# Patient Record
Sex: Female | Born: 1950 | Race: Black or African American | Hispanic: No | Marital: Married | Smoking: Never smoker
Health system: Southern US, Community
[De-identification: ages and names within clinical notes are randomized; demographics above are authoritative.]

## PROBLEM LIST (undated history)

## (undated) DIAGNOSIS — R202 Paresthesia of skin: Secondary | ICD-10-CM

## (undated) DIAGNOSIS — K219 Gastro-esophageal reflux disease without esophagitis: Secondary | ICD-10-CM

## (undated) DIAGNOSIS — R200 Anesthesia of skin: Secondary | ICD-10-CM

## (undated) DIAGNOSIS — M069 Rheumatoid arthritis, unspecified: Secondary | ICD-10-CM

## (undated) DIAGNOSIS — C8590 Non-Hodgkin lymphoma, unspecified, unspecified site: Secondary | ICD-10-CM

## (undated) DIAGNOSIS — D649 Anemia, unspecified: Secondary | ICD-10-CM

## (undated) DIAGNOSIS — J189 Pneumonia, unspecified organism: Secondary | ICD-10-CM

## (undated) DIAGNOSIS — Z8719 Personal history of other diseases of the digestive system: Secondary | ICD-10-CM

## (undated) DIAGNOSIS — K909 Intestinal malabsorption, unspecified: Secondary | ICD-10-CM

## (undated) DIAGNOSIS — I10 Essential (primary) hypertension: Secondary | ICD-10-CM

## (undated) DIAGNOSIS — M1990 Unspecified osteoarthritis, unspecified site: Secondary | ICD-10-CM

## (undated) DIAGNOSIS — C801 Malignant (primary) neoplasm, unspecified: Secondary | ICD-10-CM

## (undated) HISTORY — PX: PORTACATH PLACEMENT: SHX2246

## (undated) HISTORY — PX: JOINT REPLACEMENT: SHX530

## (undated) HISTORY — PX: EYE SURGERY: SHX253

## (undated) HISTORY — PX: OTHER SURGICAL HISTORY: SHX169

## (undated) HISTORY — PX: ROTATOR CUFF REPAIR: SHX139

## (undated) HISTORY — DX: Intestinal malabsorption, unspecified: K90.9

## (undated) HISTORY — PX: ABDOMINAL HYSTERECTOMY: SHX81

---

## 1998-04-15 ENCOUNTER — Encounter: Payer: Self-pay | Admitting: Obstetrics and Gynecology

## 1998-04-15 ENCOUNTER — Ambulatory Visit (HOSPITAL_COMMUNITY): Admission: RE | Admit: 1998-04-15 | Discharge: 1998-04-15 | Payer: Self-pay | Admitting: Obstetrics and Gynecology

## 1998-07-20 ENCOUNTER — Emergency Department (HOSPITAL_COMMUNITY): Admission: EM | Admit: 1998-07-20 | Discharge: 1998-07-20 | Payer: Self-pay | Admitting: Emergency Medicine

## 1998-09-12 ENCOUNTER — Inpatient Hospital Stay (HOSPITAL_COMMUNITY): Admission: RE | Admit: 1998-09-12 | Discharge: 1998-09-15 | Payer: Self-pay | Admitting: Obstetrics and Gynecology

## 1999-04-17 ENCOUNTER — Ambulatory Visit (HOSPITAL_COMMUNITY): Admission: RE | Admit: 1999-04-17 | Discharge: 1999-04-17 | Payer: Self-pay | Admitting: Obstetrics and Gynecology

## 1999-04-17 ENCOUNTER — Encounter: Payer: Self-pay | Admitting: Obstetrics and Gynecology

## 1999-12-11 ENCOUNTER — Other Ambulatory Visit: Admission: RE | Admit: 1999-12-11 | Discharge: 1999-12-11 | Payer: Self-pay | Admitting: Obstetrics and Gynecology

## 2000-04-19 ENCOUNTER — Encounter: Payer: Self-pay | Admitting: Obstetrics and Gynecology

## 2000-04-19 ENCOUNTER — Ambulatory Visit (HOSPITAL_COMMUNITY): Admission: RE | Admit: 2000-04-19 | Discharge: 2000-04-19 | Payer: Self-pay | Admitting: Obstetrics and Gynecology

## 2000-12-16 ENCOUNTER — Other Ambulatory Visit: Admission: RE | Admit: 2000-12-16 | Discharge: 2000-12-16 | Payer: Self-pay | Admitting: Obstetrics and Gynecology

## 2001-01-30 ENCOUNTER — Emergency Department (HOSPITAL_COMMUNITY): Admission: EM | Admit: 2001-01-30 | Discharge: 2001-01-30 | Payer: Self-pay | Admitting: Emergency Medicine

## 2001-03-16 ENCOUNTER — Encounter (INDEPENDENT_AMBULATORY_CARE_PROVIDER_SITE_OTHER): Payer: Self-pay | Admitting: *Deleted

## 2001-03-16 ENCOUNTER — Ambulatory Visit (HOSPITAL_COMMUNITY): Admission: RE | Admit: 2001-03-16 | Discharge: 2001-03-16 | Payer: Self-pay | Admitting: *Deleted

## 2001-04-21 ENCOUNTER — Encounter: Payer: Self-pay | Admitting: Obstetrics and Gynecology

## 2001-04-21 ENCOUNTER — Ambulatory Visit (HOSPITAL_COMMUNITY): Admission: RE | Admit: 2001-04-21 | Discharge: 2001-04-21 | Payer: Self-pay | Admitting: Obstetrics and Gynecology

## 2001-05-02 ENCOUNTER — Encounter: Admission: RE | Admit: 2001-05-02 | Discharge: 2001-05-02 | Payer: Self-pay | Admitting: Obstetrics and Gynecology

## 2001-05-02 ENCOUNTER — Encounter: Payer: Self-pay | Admitting: Obstetrics and Gynecology

## 2002-03-15 ENCOUNTER — Encounter: Admission: RE | Admit: 2002-03-15 | Discharge: 2002-03-15 | Payer: Self-pay | Admitting: Orthopedic Surgery

## 2002-03-15 ENCOUNTER — Encounter: Payer: Self-pay | Admitting: Orthopedic Surgery

## 2002-04-25 ENCOUNTER — Ambulatory Visit (HOSPITAL_COMMUNITY): Admission: RE | Admit: 2002-04-25 | Discharge: 2002-04-25 | Payer: Self-pay | Admitting: *Deleted

## 2002-04-25 ENCOUNTER — Encounter (INDEPENDENT_AMBULATORY_CARE_PROVIDER_SITE_OTHER): Payer: Self-pay | Admitting: Specialist

## 2002-05-03 ENCOUNTER — Encounter: Admission: RE | Admit: 2002-05-03 | Discharge: 2002-05-03 | Payer: Self-pay | Admitting: Obstetrics and Gynecology

## 2002-05-03 ENCOUNTER — Encounter: Payer: Self-pay | Admitting: Obstetrics and Gynecology

## 2002-11-09 ENCOUNTER — Encounter: Admission: RE | Admit: 2002-11-09 | Discharge: 2002-11-09 | Payer: Self-pay | Admitting: *Deleted

## 2002-11-09 ENCOUNTER — Encounter: Payer: Self-pay | Admitting: *Deleted

## 2003-04-14 ENCOUNTER — Emergency Department (HOSPITAL_COMMUNITY): Admission: EM | Admit: 2003-04-14 | Discharge: 2003-04-14 | Payer: Self-pay

## 2003-05-08 ENCOUNTER — Encounter: Admission: RE | Admit: 2003-05-08 | Discharge: 2003-05-08 | Payer: Self-pay | Admitting: Obstetrics and Gynecology

## 2003-07-09 ENCOUNTER — Encounter: Admission: RE | Admit: 2003-07-09 | Discharge: 2003-07-16 | Payer: Self-pay | Admitting: Internal Medicine

## 2003-07-17 ENCOUNTER — Ambulatory Visit (HOSPITAL_COMMUNITY): Admission: RE | Admit: 2003-07-17 | Discharge: 2003-07-17 | Payer: Self-pay | Admitting: Internal Medicine

## 2003-11-14 ENCOUNTER — Observation Stay (HOSPITAL_COMMUNITY): Admission: RE | Admit: 2003-11-14 | Discharge: 2003-11-15 | Payer: Self-pay | Admitting: Urology

## 2004-01-02 ENCOUNTER — Ambulatory Visit (HOSPITAL_COMMUNITY): Admission: RE | Admit: 2004-01-02 | Discharge: 2004-01-02 | Payer: Self-pay

## 2004-02-13 ENCOUNTER — Encounter (INDEPENDENT_AMBULATORY_CARE_PROVIDER_SITE_OTHER): Payer: Self-pay | Admitting: *Deleted

## 2004-02-13 ENCOUNTER — Encounter (INDEPENDENT_AMBULATORY_CARE_PROVIDER_SITE_OTHER): Payer: Self-pay | Admitting: General Surgery

## 2004-02-13 ENCOUNTER — Ambulatory Visit (HOSPITAL_COMMUNITY): Admission: RE | Admit: 2004-02-13 | Discharge: 2004-02-13 | Payer: Self-pay | Admitting: General Surgery

## 2004-02-29 ENCOUNTER — Ambulatory Visit (HOSPITAL_COMMUNITY): Admission: RE | Admit: 2004-02-29 | Discharge: 2004-02-29 | Payer: Self-pay | Admitting: Hematology & Oncology

## 2004-03-03 ENCOUNTER — Ambulatory Visit (HOSPITAL_COMMUNITY): Admission: RE | Admit: 2004-03-03 | Discharge: 2004-03-03 | Payer: Self-pay | Admitting: Hematology & Oncology

## 2004-04-28 ENCOUNTER — Encounter: Admission: RE | Admit: 2004-04-28 | Discharge: 2004-04-28 | Payer: Self-pay | Admitting: Hematology & Oncology

## 2004-05-02 ENCOUNTER — Ambulatory Visit: Payer: Self-pay | Admitting: Hematology & Oncology

## 2004-05-05 ENCOUNTER — Encounter (HOSPITAL_COMMUNITY): Admission: RE | Admit: 2004-05-05 | Discharge: 2004-07-05 | Payer: Self-pay | Admitting: Hematology & Oncology

## 2004-05-06 ENCOUNTER — Emergency Department (HOSPITAL_COMMUNITY): Admission: EM | Admit: 2004-05-06 | Discharge: 2004-05-06 | Payer: Self-pay | Admitting: Emergency Medicine

## 2004-05-09 ENCOUNTER — Inpatient Hospital Stay (HOSPITAL_COMMUNITY): Admission: EM | Admit: 2004-05-09 | Discharge: 2004-05-15 | Payer: Self-pay | Admitting: Hematology & Oncology

## 2004-05-09 ENCOUNTER — Ambulatory Visit: Payer: Self-pay | Admitting: Hematology & Oncology

## 2004-05-26 ENCOUNTER — Ambulatory Visit (HOSPITAL_COMMUNITY): Admission: RE | Admit: 2004-05-26 | Discharge: 2004-05-26 | Payer: Self-pay | Admitting: Hematology & Oncology

## 2004-06-24 ENCOUNTER — Ambulatory Visit: Payer: Self-pay | Admitting: Hematology & Oncology

## 2004-08-08 ENCOUNTER — Ambulatory Visit (HOSPITAL_COMMUNITY): Admission: RE | Admit: 2004-08-08 | Discharge: 2004-08-08 | Payer: Self-pay | Admitting: Hematology & Oncology

## 2004-08-21 ENCOUNTER — Ambulatory Visit: Payer: Self-pay | Admitting: Hematology & Oncology

## 2004-10-15 ENCOUNTER — Ambulatory Visit: Payer: Self-pay | Admitting: Hematology & Oncology

## 2004-10-22 ENCOUNTER — Encounter (INDEPENDENT_AMBULATORY_CARE_PROVIDER_SITE_OTHER): Payer: Self-pay | Admitting: Specialist

## 2004-10-22 ENCOUNTER — Ambulatory Visit (HOSPITAL_COMMUNITY): Admission: RE | Admit: 2004-10-22 | Discharge: 2004-10-22 | Payer: Self-pay | Admitting: *Deleted

## 2004-11-07 ENCOUNTER — Ambulatory Visit (HOSPITAL_COMMUNITY): Admission: RE | Admit: 2004-11-07 | Discharge: 2004-11-07 | Payer: Self-pay | Admitting: Hematology & Oncology

## 2004-12-23 ENCOUNTER — Ambulatory Visit: Payer: Self-pay | Admitting: Hematology & Oncology

## 2005-02-06 ENCOUNTER — Ambulatory Visit (HOSPITAL_COMMUNITY): Admission: RE | Admit: 2005-02-06 | Discharge: 2005-02-06 | Payer: Self-pay | Admitting: Hematology & Oncology

## 2005-02-12 ENCOUNTER — Ambulatory Visit: Payer: Self-pay | Admitting: Hematology & Oncology

## 2005-04-23 ENCOUNTER — Ambulatory Visit: Payer: Self-pay | Admitting: Hematology & Oncology

## 2005-04-30 ENCOUNTER — Encounter: Admission: RE | Admit: 2005-04-30 | Discharge: 2005-04-30 | Payer: Self-pay | Admitting: Obstetrics and Gynecology

## 2005-05-11 ENCOUNTER — Ambulatory Visit (HOSPITAL_COMMUNITY): Admission: RE | Admit: 2005-05-11 | Discharge: 2005-05-11 | Payer: Self-pay | Admitting: Hematology & Oncology

## 2005-05-20 ENCOUNTER — Emergency Department (HOSPITAL_COMMUNITY): Admission: EM | Admit: 2005-05-20 | Discharge: 2005-05-20 | Payer: Self-pay | Admitting: Emergency Medicine

## 2005-07-21 ENCOUNTER — Ambulatory Visit: Payer: Self-pay | Admitting: Hematology & Oncology

## 2005-08-26 ENCOUNTER — Ambulatory Visit (HOSPITAL_COMMUNITY): Admission: RE | Admit: 2005-08-26 | Discharge: 2005-08-26 | Payer: Self-pay | Admitting: Hematology & Oncology

## 2005-10-14 ENCOUNTER — Ambulatory Visit: Payer: Self-pay | Admitting: Hematology & Oncology

## 2005-11-25 ENCOUNTER — Ambulatory Visit: Payer: Self-pay | Admitting: Hematology & Oncology

## 2005-12-23 LAB — CBC WITH DIFFERENTIAL/PLATELET
BASO%: 0.2 % (ref 0.0–2.0)
Basophils Absolute: 0 10*3/uL (ref 0.0–0.1)
EOS%: 0.9 % (ref 0.0–7.0)
Eosinophils Absolute: 0 10*3/uL (ref 0.0–0.5)
HCT: 32.9 % — ABNORMAL LOW (ref 34.8–46.6)
HGB: 11.3 g/dL — ABNORMAL LOW (ref 11.6–15.9)
LYMPH%: 30.1 % (ref 14.0–48.0)
MCH: 37.2 pg — ABNORMAL HIGH (ref 26.0–34.0)
MCHC: 34.2 g/dL (ref 32.0–36.0)
MCV: 108.8 fL — ABNORMAL HIGH (ref 81.0–101.0)
MONO#: 0.3 10*3/uL (ref 0.1–0.9)
MONO%: 6.7 % (ref 0.0–13.0)
NEUT#: 2.6 10*3/uL (ref 1.5–6.5)
NEUT%: 62.1 % (ref 39.6–76.8)
Platelets: 141 10*3/uL — ABNORMAL LOW (ref 145–400)
RBC: 3.03 10*6/uL — ABNORMAL LOW (ref 3.70–5.32)
RDW: 15.1 % — ABNORMAL HIGH (ref 11.3–14.5)
WBC: 4.2 10*3/uL (ref 3.9–10.0)
lymph#: 1.3 10*3/uL (ref 0.9–3.3)

## 2005-12-23 LAB — COMPREHENSIVE METABOLIC PANEL
ALT: 10 U/L (ref 0–40)
AST: 18 U/L (ref 0–37)
Albumin: 4.2 g/dL (ref 3.5–5.2)
Alkaline Phosphatase: 46 U/L (ref 39–117)
BUN: 12 mg/dL (ref 6–23)
CO2: 26 mEq/L (ref 19–32)
Calcium: 9.4 mg/dL (ref 8.4–10.5)
Chloride: 111 mEq/L (ref 96–112)
Creatinine, Ser: 0.81 mg/dL (ref 0.40–1.20)
Glucose, Bld: 103 mg/dL — ABNORMAL HIGH (ref 70–99)
Potassium: 5.2 mEq/L (ref 3.5–5.3)
Sodium: 147 mEq/L — ABNORMAL HIGH (ref 135–145)
Total Bilirubin: 0.4 mg/dL (ref 0.3–1.2)
Total Protein: 6.7 g/dL (ref 6.0–8.3)

## 2005-12-23 LAB — LACTATE DEHYDROGENASE: LDH: 196 U/L (ref 94–250)

## 2005-12-24 ENCOUNTER — Emergency Department (HOSPITAL_COMMUNITY): Admission: EM | Admit: 2005-12-24 | Discharge: 2005-12-25 | Payer: Self-pay | Admitting: Emergency Medicine

## 2006-02-08 ENCOUNTER — Ambulatory Visit: Payer: Self-pay | Admitting: Hematology & Oncology

## 2006-03-24 ENCOUNTER — Ambulatory Visit: Payer: Self-pay | Admitting: Hematology & Oncology

## 2006-05-03 ENCOUNTER — Encounter: Admission: RE | Admit: 2006-05-03 | Discharge: 2006-05-03 | Payer: Self-pay | Admitting: Obstetrics and Gynecology

## 2006-05-05 ENCOUNTER — Ambulatory Visit: Payer: Self-pay | Admitting: Hematology & Oncology

## 2006-05-14 ENCOUNTER — Encounter: Admission: RE | Admit: 2006-05-14 | Discharge: 2006-05-14 | Payer: Self-pay | Admitting: Obstetrics and Gynecology

## 2006-05-31 ENCOUNTER — Ambulatory Visit (HOSPITAL_COMMUNITY): Admission: RE | Admit: 2006-05-31 | Discharge: 2006-05-31 | Payer: Self-pay | Admitting: *Deleted

## 2006-06-07 LAB — COMPREHENSIVE METABOLIC PANEL
ALT: 10 U/L (ref 0–35)
AST: 18 U/L (ref 0–37)
Albumin: 4.1 g/dL (ref 3.5–5.2)
Alkaline Phosphatase: 48 U/L (ref 39–117)
BUN: 19 mg/dL (ref 6–23)
CO2: 23 mEq/L (ref 19–32)
Calcium: 9.2 mg/dL (ref 8.4–10.5)
Chloride: 109 mEq/L (ref 96–112)
Creatinine, Ser: 0.73 mg/dL (ref 0.40–1.20)
Glucose, Bld: 98 mg/dL (ref 70–99)
Potassium: 4.6 mEq/L (ref 3.5–5.3)
Sodium: 142 mEq/L (ref 135–145)
Total Bilirubin: 0.3 mg/dL (ref 0.3–1.2)
Total Protein: 6.3 g/dL (ref 6.0–8.3)

## 2006-06-07 LAB — CBC WITH DIFFERENTIAL/PLATELET
BASO%: 0.1 % (ref 0.0–2.0)
Basophils Absolute: 0 10*3/uL (ref 0.0–0.1)
EOS%: 0.8 % (ref 0.0–7.0)
Eosinophils Absolute: 0 10*3/uL (ref 0.0–0.5)
HCT: 31 % — ABNORMAL LOW (ref 34.8–46.6)
HGB: 10.8 g/dL — ABNORMAL LOW (ref 11.6–15.9)
LYMPH%: 29.8 % (ref 14.0–48.0)
MCH: 37.5 pg — ABNORMAL HIGH (ref 26.0–34.0)
MCHC: 34.6 g/dL (ref 32.0–36.0)
MCV: 108.2 fL — ABNORMAL HIGH (ref 81.0–101.0)
MONO#: 0.3 10*3/uL (ref 0.1–0.9)
MONO%: 6.6 % (ref 0.0–13.0)
NEUT#: 2.5 10*3/uL (ref 1.5–6.5)
NEUT%: 62.7 % (ref 39.6–76.8)
Platelets: 141 10*3/uL — ABNORMAL LOW (ref 145–400)
RBC: 2.87 10*6/uL — ABNORMAL LOW (ref 3.70–5.32)
RDW: 14.4 % (ref 11.3–14.5)
WBC: 4 10*3/uL (ref 3.9–10.0)
lymph#: 1.2 10*3/uL (ref 0.9–3.3)

## 2006-06-07 LAB — LACTATE DEHYDROGENASE: LDH: 190 U/L (ref 94–250)

## 2006-06-09 ENCOUNTER — Ambulatory Visit (HOSPITAL_COMMUNITY): Admission: RE | Admit: 2006-06-09 | Discharge: 2006-06-09 | Payer: Self-pay | Admitting: Hematology & Oncology

## 2006-06-21 ENCOUNTER — Ambulatory Visit: Payer: Self-pay | Admitting: Hematology & Oncology

## 2006-08-02 ENCOUNTER — Emergency Department (HOSPITAL_COMMUNITY): Admission: EM | Admit: 2006-08-02 | Discharge: 2006-08-02 | Payer: Self-pay | Admitting: Emergency Medicine

## 2006-08-30 ENCOUNTER — Ambulatory Visit: Payer: Self-pay | Admitting: Hematology & Oncology

## 2006-10-11 ENCOUNTER — Ambulatory Visit: Payer: Self-pay | Admitting: Hematology & Oncology

## 2006-11-10 ENCOUNTER — Encounter: Admission: RE | Admit: 2006-11-10 | Discharge: 2006-11-10 | Payer: Self-pay | Admitting: Obstetrics and Gynecology

## 2006-11-24 ENCOUNTER — Ambulatory Visit: Payer: Self-pay | Admitting: Hematology & Oncology

## 2006-12-14 LAB — CBC WITH DIFFERENTIAL/PLATELET
BASO%: 0.2 % (ref 0.0–2.0)
Basophils Absolute: 0 10*3/uL (ref 0.0–0.1)
EOS%: 0.5 % (ref 0.0–7.0)
Eosinophils Absolute: 0 10*3/uL (ref 0.0–0.5)
HCT: 28.6 % — ABNORMAL LOW (ref 34.8–46.6)
HGB: 10.2 g/dL — ABNORMAL LOW (ref 11.6–15.9)
LYMPH%: 26.9 % (ref 14.0–48.0)
MCH: 38.5 pg — ABNORMAL HIGH (ref 26.0–34.0)
MCHC: 35.7 g/dL (ref 32.0–36.0)
MCV: 107.9 fL — ABNORMAL HIGH (ref 81.0–101.0)
MONO#: 0.2 10*3/uL (ref 0.1–0.9)
MONO%: 4.8 % (ref 0.0–13.0)
NEUT#: 2.9 10*3/uL (ref 1.5–6.5)
NEUT%: 67.6 % (ref 39.6–76.8)
Platelets: 140 10*3/uL — ABNORMAL LOW (ref 145–400)
RBC: 2.66 10*6/uL — ABNORMAL LOW (ref 3.70–5.32)
RDW: 15 % — ABNORMAL HIGH (ref 11.3–14.5)
WBC: 4.2 10*3/uL (ref 3.9–10.0)
lymph#: 1.1 10*3/uL (ref 0.9–3.3)

## 2006-12-14 LAB — COMPREHENSIVE METABOLIC PANEL
ALT: 14 U/L (ref 0–35)
AST: 17 U/L (ref 0–37)
Albumin: 4.3 g/dL (ref 3.5–5.2)
Alkaline Phosphatase: 44 U/L (ref 39–117)
BUN: 17 mg/dL (ref 6–23)
CO2: 26 mEq/L (ref 19–32)
Calcium: 9.2 mg/dL (ref 8.4–10.5)
Chloride: 108 mEq/L (ref 96–112)
Creatinine, Ser: 0.78 mg/dL (ref 0.40–1.20)
Glucose, Bld: 106 mg/dL — ABNORMAL HIGH (ref 70–99)
Potassium: 4.4 mEq/L (ref 3.5–5.3)
Sodium: 142 mEq/L (ref 135–145)
Total Bilirubin: 0.4 mg/dL (ref 0.3–1.2)
Total Protein: 6.4 g/dL (ref 6.0–8.3)

## 2006-12-14 LAB — LACTATE DEHYDROGENASE: LDH: 224 U/L (ref 94–250)

## 2006-12-16 ENCOUNTER — Ambulatory Visit (HOSPITAL_COMMUNITY): Admission: RE | Admit: 2006-12-16 | Discharge: 2006-12-16 | Payer: Self-pay | Admitting: Hematology & Oncology

## 2007-01-25 ENCOUNTER — Emergency Department (HOSPITAL_COMMUNITY): Admission: EM | Admit: 2007-01-25 | Discharge: 2007-01-25 | Payer: Self-pay | Admitting: Emergency Medicine

## 2007-01-31 ENCOUNTER — Ambulatory Visit: Payer: Self-pay | Admitting: Hematology & Oncology

## 2007-03-08 ENCOUNTER — Ambulatory Visit (HOSPITAL_COMMUNITY): Admission: RE | Admit: 2007-03-08 | Discharge: 2007-03-08 | Payer: Self-pay | Admitting: Hematology & Oncology

## 2007-04-25 ENCOUNTER — Ambulatory Visit: Payer: Self-pay | Admitting: Hematology & Oncology

## 2007-05-05 ENCOUNTER — Encounter: Admission: RE | Admit: 2007-05-05 | Discharge: 2007-05-05 | Payer: Self-pay | Admitting: Obstetrics and Gynecology

## 2007-06-06 ENCOUNTER — Ambulatory Visit: Payer: Self-pay | Admitting: Hematology & Oncology

## 2007-06-06 LAB — CBC WITH DIFFERENTIAL/PLATELET
BASO%: 0.2 % (ref 0.0–2.0)
Basophils Absolute: 0 10*3/uL (ref 0.0–0.1)
EOS%: 0.4 % (ref 0.0–7.0)
Eosinophils Absolute: 0 10*3/uL (ref 0.0–0.5)
HCT: 30.3 % — ABNORMAL LOW (ref 34.8–46.6)
HGB: 10.6 g/dL — ABNORMAL LOW (ref 11.6–15.9)
LYMPH%: 24.7 % (ref 14.0–48.0)
MCH: 37.6 pg — ABNORMAL HIGH (ref 26.0–34.0)
MCHC: 35.1 g/dL (ref 32.0–36.0)
MCV: 107 fL — ABNORMAL HIGH (ref 81.0–101.0)
MONO#: 0.3 10*3/uL (ref 0.1–0.9)
MONO%: 6.1 % (ref 0.0–13.0)
NEUT#: 3.3 10*3/uL (ref 1.5–6.5)
NEUT%: 68.6 % (ref 39.6–76.8)
Platelets: 163 10*3/uL (ref 145–400)
RBC: 2.83 10*6/uL — ABNORMAL LOW (ref 3.70–5.32)
RDW: 14.6 % — ABNORMAL HIGH (ref 11.3–14.5)
WBC: 4.9 10*3/uL (ref 3.9–10.0)
lymph#: 1.2 10*3/uL (ref 0.9–3.3)

## 2007-06-06 LAB — COMPREHENSIVE METABOLIC PANEL
ALT: 16 U/L (ref 0–35)
AST: 19 U/L (ref 0–37)
Albumin: 3.9 g/dL (ref 3.5–5.2)
Alkaline Phosphatase: 44 U/L (ref 39–117)
BUN: 16 mg/dL (ref 6–23)
CO2: 27 mEq/L (ref 19–32)
Calcium: 9.3 mg/dL (ref 8.4–10.5)
Chloride: 104 mEq/L (ref 96–112)
Creatinine, Ser: 0.75 mg/dL (ref 0.40–1.20)
Glucose, Bld: 105 mg/dL — ABNORMAL HIGH (ref 70–99)
Potassium: 3.8 mEq/L (ref 3.5–5.3)
Sodium: 139 mEq/L (ref 135–145)
Total Bilirubin: 0.6 mg/dL (ref 0.3–1.2)
Total Protein: 6.7 g/dL (ref 6.0–8.3)

## 2007-06-06 LAB — LACTATE DEHYDROGENASE: LDH: 199 U/L (ref 94–250)

## 2007-06-07 LAB — ERYTHROPOIETIN: Erythropoietin: 109 m[IU]/mL — ABNORMAL HIGH (ref 2.6–34.0)

## 2007-06-08 ENCOUNTER — Ambulatory Visit (HOSPITAL_COMMUNITY): Admission: RE | Admit: 2007-06-08 | Discharge: 2007-06-08 | Payer: Self-pay | Admitting: Hematology & Oncology

## 2007-07-20 ENCOUNTER — Ambulatory Visit: Payer: Self-pay | Admitting: Hematology & Oncology

## 2007-09-12 ENCOUNTER — Ambulatory Visit: Payer: Self-pay | Admitting: Hematology & Oncology

## 2007-10-24 ENCOUNTER — Ambulatory Visit: Payer: Self-pay | Admitting: Hematology & Oncology

## 2007-11-14 ENCOUNTER — Ambulatory Visit (HOSPITAL_COMMUNITY): Admission: RE | Admit: 2007-11-14 | Discharge: 2007-11-14 | Payer: Self-pay | Admitting: Internal Medicine

## 2007-12-01 ENCOUNTER — Ambulatory Visit (HOSPITAL_COMMUNITY): Admission: RE | Admit: 2007-12-01 | Discharge: 2007-12-01 | Payer: Self-pay | Admitting: Hematology & Oncology

## 2007-12-27 ENCOUNTER — Emergency Department (HOSPITAL_COMMUNITY): Admission: EM | Admit: 2007-12-27 | Discharge: 2007-12-27 | Payer: Self-pay | Admitting: Emergency Medicine

## 2008-01-04 ENCOUNTER — Ambulatory Visit: Payer: Self-pay | Admitting: Vascular Surgery

## 2008-01-17 ENCOUNTER — Ambulatory Visit: Payer: Self-pay | Admitting: Hematology & Oncology

## 2008-02-09 ENCOUNTER — Ambulatory Visit (HOSPITAL_COMMUNITY): Admission: RE | Admit: 2008-02-09 | Discharge: 2008-02-09 | Payer: Self-pay | Admitting: Vascular Surgery

## 2008-02-09 ENCOUNTER — Ambulatory Visit: Payer: Self-pay | Admitting: Vascular Surgery

## 2008-02-29 ENCOUNTER — Ambulatory Visit: Payer: Self-pay | Admitting: Vascular Surgery

## 2008-02-29 ENCOUNTER — Ambulatory Visit: Payer: Self-pay | Admitting: Hematology & Oncology

## 2008-03-01 LAB — CBC WITH DIFFERENTIAL (CANCER CENTER ONLY)
BASO#: 0 10*3/uL (ref 0.0–0.2)
BASO%: 0.5 % (ref 0.0–2.0)
EOS%: 1.2 % (ref 0.0–7.0)
Eosinophils Absolute: 0.1 10*3/uL (ref 0.0–0.5)
HCT: 33.9 % — ABNORMAL LOW (ref 34.8–46.6)
HGB: 11.8 g/dL (ref 11.6–15.9)
LYMPH#: 1.3 10*3/uL (ref 0.9–3.3)
LYMPH%: 24.4 % (ref 14.0–48.0)
MCH: 36.1 pg — ABNORMAL HIGH (ref 26.0–34.0)
MCHC: 34.7 g/dL (ref 32.0–36.0)
MCV: 104 fL — ABNORMAL HIGH (ref 81–101)
MONO#: 0.3 10*3/uL (ref 0.1–0.9)
MONO%: 6.2 % (ref 0.0–13.0)
NEUT#: 3.5 10*3/uL (ref 1.5–6.5)
NEUT%: 67.7 % (ref 39.6–80.0)
Platelets: 187 10*3/uL (ref 145–400)
RBC: 3.25 10*6/uL — ABNORMAL LOW (ref 3.70–5.32)
RDW: 11.6 % (ref 10.5–14.6)
WBC: 5.2 10*3/uL (ref 3.9–10.0)

## 2008-03-01 LAB — COMPREHENSIVE METABOLIC PANEL
ALT: 12 U/L (ref 0–35)
AST: 15 U/L (ref 0–37)
Albumin: 4.4 g/dL (ref 3.5–5.2)
Alkaline Phosphatase: 47 U/L (ref 39–117)
BUN: 18 mg/dL (ref 6–23)
CO2: 22 mEq/L (ref 19–32)
Calcium: 9.4 mg/dL (ref 8.4–10.5)
Chloride: 105 mEq/L (ref 96–112)
Creatinine, Ser: 1.12 mg/dL (ref 0.40–1.20)
Glucose, Bld: 90 mg/dL (ref 70–99)
Potassium: 4.1 mEq/L (ref 3.5–5.3)
Sodium: 141 mEq/L (ref 135–145)
Total Bilirubin: 0.6 mg/dL (ref 0.3–1.2)
Total Protein: 6.6 g/dL (ref 6.0–8.3)

## 2008-03-01 LAB — LACTATE DEHYDROGENASE: LDH: 187 U/L (ref 94–250)

## 2008-03-22 ENCOUNTER — Emergency Department (HOSPITAL_COMMUNITY): Admission: EM | Admit: 2008-03-22 | Discharge: 2008-03-22 | Payer: Self-pay | Admitting: Emergency Medicine

## 2008-05-07 ENCOUNTER — Encounter: Admission: RE | Admit: 2008-05-07 | Discharge: 2008-05-07 | Payer: Self-pay | Admitting: Obstetrics and Gynecology

## 2008-06-22 ENCOUNTER — Ambulatory Visit: Payer: Self-pay | Admitting: Hematology & Oncology

## 2008-06-23 ENCOUNTER — Emergency Department (HOSPITAL_COMMUNITY): Admission: EM | Admit: 2008-06-23 | Discharge: 2008-06-23 | Payer: Self-pay | Admitting: Emergency Medicine

## 2008-08-24 ENCOUNTER — Emergency Department (HOSPITAL_COMMUNITY)
Admission: EM | Admit: 2008-08-24 | Discharge: 2008-08-24 | Payer: Self-pay | Admitting: Blood Banking & Transfusion Medicine

## 2008-10-17 ENCOUNTER — Ambulatory Visit: Payer: Self-pay | Admitting: Hematology & Oncology

## 2008-10-18 LAB — CBC WITH DIFFERENTIAL (CANCER CENTER ONLY)
BASO#: 0 10*3/uL (ref 0.0–0.2)
BASO%: 0.3 % (ref 0.0–2.0)
EOS%: 1.6 % (ref 0.0–7.0)
Eosinophils Absolute: 0.1 10*3/uL (ref 0.0–0.5)
HCT: 33.6 % — ABNORMAL LOW (ref 34.8–46.6)
HGB: 11.3 g/dL — ABNORMAL LOW (ref 11.6–15.9)
LYMPH#: 1.4 10*3/uL (ref 0.9–3.3)
LYMPH%: 25.6 % (ref 14.0–48.0)
MCH: 35.6 pg — ABNORMAL HIGH (ref 26.0–34.0)
MCHC: 33.7 g/dL (ref 32.0–36.0)
MCV: 106 fL — ABNORMAL HIGH (ref 81–101)
MONO#: 0.3 10*3/uL (ref 0.1–0.9)
MONO%: 4.6 % (ref 0.0–13.0)
NEUT#: 3.7 10*3/uL (ref 1.5–6.5)
NEUT%: 67.9 % (ref 39.6–80.0)
Platelets: 181 10*3/uL (ref 145–400)
RBC: 3.18 10*6/uL — ABNORMAL LOW (ref 3.70–5.32)
RDW: 11.5 % (ref 10.5–14.6)
WBC: 5.5 10*3/uL (ref 3.9–10.0)

## 2008-10-18 LAB — CHCC SATELLITE - SMEAR

## 2008-10-20 LAB — RETICULOCYTES (CHCC)
ABS Retic: 61.2 10*3/uL (ref 19.0–186.0)
RBC.: 3.22 MIL/uL — ABNORMAL LOW (ref 3.87–5.11)
Retic Ct Pct: 1.9 % (ref 0.4–3.1)

## 2008-10-20 LAB — COMPREHENSIVE METABOLIC PANEL
ALT: 10 U/L (ref 0–35)
AST: 16 U/L (ref 0–37)
Albumin: 4.3 g/dL (ref 3.5–5.2)
Alkaline Phosphatase: 47 U/L (ref 39–117)
BUN: 17 mg/dL (ref 6–23)
CO2: 23 mEq/L (ref 19–32)
Calcium: 9.6 mg/dL (ref 8.4–10.5)
Chloride: 105 mEq/L (ref 96–112)
Creatinine, Ser: 0.8 mg/dL (ref 0.40–1.20)
Glucose, Bld: 112 mg/dL — ABNORMAL HIGH (ref 70–99)
Potassium: 4 mEq/L (ref 3.5–5.3)
Sodium: 140 mEq/L (ref 135–145)
Total Bilirubin: 0.4 mg/dL (ref 0.3–1.2)
Total Protein: 6.4 g/dL (ref 6.0–8.3)

## 2008-10-20 LAB — TRANSFERRIN RECEPTOR, SOLUABLE: Transferrin Receptor, Soluble: 32 nmol/L

## 2008-10-20 LAB — ERYTHROPOIETIN: Erythropoietin: 68.7 m[IU]/mL — ABNORMAL HIGH (ref 2.6–34.0)

## 2008-10-20 LAB — FERRITIN: Ferritin: 324 ng/mL — ABNORMAL HIGH (ref 10–291)

## 2009-01-09 ENCOUNTER — Encounter (HOSPITAL_COMMUNITY): Admission: RE | Admit: 2009-01-09 | Discharge: 2009-04-04 | Payer: Self-pay | Admitting: Orthopedic Surgery

## 2009-04-09 ENCOUNTER — Ambulatory Visit: Payer: Self-pay | Admitting: Hematology & Oncology

## 2009-04-11 LAB — CBC WITH DIFFERENTIAL (CANCER CENTER ONLY)
BASO#: 0 10*3/uL (ref 0.0–0.2)
BASO%: 0.5 % (ref 0.0–2.0)
EOS%: 1.7 % (ref 0.0–7.0)
Eosinophils Absolute: 0.1 10*3/uL (ref 0.0–0.5)
HCT: 32.4 % — ABNORMAL LOW (ref 34.8–46.6)
HGB: 11.2 g/dL — ABNORMAL LOW (ref 11.6–15.9)
LYMPH#: 1.7 10*3/uL (ref 0.9–3.3)
LYMPH%: 26.4 % (ref 14.0–48.0)
MCH: 36.6 pg — ABNORMAL HIGH (ref 26.0–34.0)
MCHC: 34.7 g/dL (ref 32.0–36.0)
MCV: 106 fL — ABNORMAL HIGH (ref 81–101)
MONO#: 0.5 10*3/uL (ref 0.1–0.9)
MONO%: 7 % (ref 0.0–13.0)
NEUT#: 4.2 10*3/uL (ref 1.5–6.5)
NEUT%: 64.4 % (ref 39.6–80.0)
Platelets: 179 10*3/uL (ref 145–400)
RBC: 3.07 10*6/uL — ABNORMAL LOW (ref 3.70–5.32)
RDW: 10.8 % (ref 10.5–14.6)
WBC: 6.5 10*3/uL (ref 3.9–10.0)

## 2009-04-12 LAB — COMPREHENSIVE METABOLIC PANEL
ALT: 10 U/L (ref 0–35)
AST: 13 U/L (ref 0–37)
Albumin: 4.1 g/dL (ref 3.5–5.2)
Alkaline Phosphatase: 48 U/L (ref 39–117)
BUN: 17 mg/dL (ref 6–23)
CO2: 24 mEq/L (ref 19–32)
Calcium: 9.6 mg/dL (ref 8.4–10.5)
Chloride: 107 mEq/L (ref 96–112)
Creatinine, Ser: 0.83 mg/dL (ref 0.40–1.20)
Glucose, Bld: 99 mg/dL (ref 70–99)
Potassium: 4 mEq/L (ref 3.5–5.3)
Sodium: 143 mEq/L (ref 135–145)
Total Bilirubin: 0.4 mg/dL (ref 0.3–1.2)
Total Protein: 6.4 g/dL (ref 6.0–8.3)

## 2009-04-12 LAB — VITAMIN D 25 HYDROXY (VIT D DEFICIENCY, FRACTURES): Vit D, 25-Hydroxy: 45 ng/mL (ref 30–89)

## 2009-04-12 LAB — LACTATE DEHYDROGENASE: LDH: 166 U/L (ref 94–250)

## 2009-05-08 ENCOUNTER — Encounter: Admission: RE | Admit: 2009-05-08 | Discharge: 2009-05-08 | Payer: Self-pay | Admitting: Obstetrics and Gynecology

## 2009-05-12 ENCOUNTER — Emergency Department (HOSPITAL_COMMUNITY): Admission: EM | Admit: 2009-05-12 | Discharge: 2009-05-12 | Payer: Self-pay | Admitting: Emergency Medicine

## 2009-09-18 ENCOUNTER — Ambulatory Visit: Payer: Self-pay | Admitting: Hematology & Oncology

## 2009-09-19 LAB — COMPREHENSIVE METABOLIC PANEL
ALT: 12 U/L (ref 0–35)
AST: 17 U/L (ref 0–37)
Albumin: 4.5 g/dL (ref 3.5–5.2)
Alkaline Phosphatase: 44 U/L (ref 39–117)
BUN: 18 mg/dL (ref 6–23)
CO2: 23 mEq/L (ref 19–32)
Calcium: 9.4 mg/dL (ref 8.4–10.5)
Chloride: 105 mEq/L (ref 96–112)
Creatinine, Ser: 0.77 mg/dL (ref 0.40–1.20)
Glucose, Bld: 106 mg/dL — ABNORMAL HIGH (ref 70–99)
Potassium: 4.1 mEq/L (ref 3.5–5.3)
Sodium: 141 mEq/L (ref 135–145)
Total Bilirubin: 0.4 mg/dL (ref 0.3–1.2)
Total Protein: 6.7 g/dL (ref 6.0–8.3)

## 2009-09-19 LAB — CBC WITH DIFFERENTIAL (CANCER CENTER ONLY)
BASO#: 0 10*3/uL (ref 0.0–0.2)
BASO%: 0.4 % (ref 0.0–2.0)
EOS%: 0.9 % (ref 0.0–7.0)
Eosinophils Absolute: 0.1 10*3/uL (ref 0.0–0.5)
HCT: 33.7 % — ABNORMAL LOW (ref 34.8–46.6)
HGB: 11.2 g/dL — ABNORMAL LOW (ref 11.6–15.9)
LYMPH#: 1.3 10*3/uL (ref 0.9–3.3)
LYMPH%: 22.5 % (ref 14.0–48.0)
MCH: 35.6 pg — ABNORMAL HIGH (ref 26.0–34.0)
MCHC: 33.3 g/dL (ref 32.0–36.0)
MCV: 107 fL — ABNORMAL HIGH (ref 81–101)
MONO#: 0.3 10*3/uL (ref 0.1–0.9)
MONO%: 4.3 % (ref 0.0–13.0)
NEUT#: 4.2 10*3/uL (ref 1.5–6.5)
NEUT%: 71.9 % (ref 39.6–80.0)
Platelets: 184 10*3/uL (ref 145–400)
RBC: 3.16 10*6/uL — ABNORMAL LOW (ref 3.70–5.32)
RDW: 10.9 % (ref 10.5–14.6)
WBC: 5.8 10*3/uL (ref 3.9–10.0)

## 2009-09-19 LAB — LACTATE DEHYDROGENASE: LDH: 190 U/L (ref 94–250)

## 2010-01-09 ENCOUNTER — Ambulatory Visit: Payer: Self-pay | Admitting: Hematology & Oncology

## 2010-01-10 LAB — CBC WITH DIFFERENTIAL (CANCER CENTER ONLY)
BASO#: 0 10*3/uL (ref 0.0–0.2)
BASO%: 0.3 % (ref 0.0–2.0)
EOS%: 1.3 % (ref 0.0–7.0)
Eosinophils Absolute: 0.1 10*3/uL (ref 0.0–0.5)
HCT: 32.6 % — ABNORMAL LOW (ref 34.8–46.6)
HGB: 11.1 g/dL — ABNORMAL LOW (ref 11.6–15.9)
LYMPH#: 1.4 10*3/uL (ref 0.9–3.3)
LYMPH%: 27.9 % (ref 14.0–48.0)
MCH: 35.6 pg — ABNORMAL HIGH (ref 26.0–34.0)
MCHC: 33.9 g/dL (ref 32.0–36.0)
MCV: 105 fL — ABNORMAL HIGH (ref 81–101)
MONO#: 0.3 10*3/uL (ref 0.1–0.9)
MONO%: 5.8 % (ref 0.0–13.0)
NEUT#: 3.3 10*3/uL (ref 1.5–6.5)
NEUT%: 64.7 % (ref 39.6–80.0)
Platelets: 178 10*3/uL (ref 145–400)
RBC: 3.1 10*6/uL — ABNORMAL LOW (ref 3.70–5.32)
RDW: 11.6 % (ref 10.5–14.6)
WBC: 5.2 10*3/uL (ref 3.9–10.0)

## 2010-01-10 LAB — RETICULOCYTES (CHCC)
ABS Retic: 53.6 10*3/uL (ref 19.0–186.0)
RBC.: 3.15 MIL/uL — ABNORMAL LOW (ref 3.87–5.11)
Retic Ct Pct: 1.7 % (ref 0.4–3.1)

## 2010-01-10 LAB — COMPREHENSIVE METABOLIC PANEL
ALT: 11 U/L (ref 0–35)
AST: 16 U/L (ref 0–37)
Albumin: 4.3 g/dL (ref 3.5–5.2)
Alkaline Phosphatase: 45 U/L (ref 39–117)
BUN: 25 mg/dL — ABNORMAL HIGH (ref 6–23)
CO2: 20 mEq/L (ref 19–32)
Calcium: 9.6 mg/dL (ref 8.4–10.5)
Chloride: 106 mEq/L (ref 96–112)
Creatinine, Ser: 0.84 mg/dL (ref 0.40–1.20)
Glucose, Bld: 123 mg/dL — ABNORMAL HIGH (ref 70–99)
Potassium: 4.3 mEq/L (ref 3.5–5.3)
Sodium: 140 mEq/L (ref 135–145)
Total Bilirubin: 0.3 mg/dL (ref 0.3–1.2)
Total Protein: 6.6 g/dL (ref 6.0–8.3)

## 2010-01-10 LAB — FERRITIN: Ferritin: 334 ng/mL — ABNORMAL HIGH (ref 10–291)

## 2010-01-10 LAB — CHCC SATELLITE - SMEAR

## 2010-01-10 LAB — LACTATE DEHYDROGENASE: LDH: 188 U/L (ref 94–250)

## 2010-01-10 LAB — VITAMIN D 25 HYDROXY (VIT D DEFICIENCY, FRACTURES): Vit D, 25-Hydroxy: 67 ng/mL (ref 30–89)

## 2010-05-06 ENCOUNTER — Emergency Department (HOSPITAL_COMMUNITY): Admission: EM | Admit: 2010-05-06 | Discharge: 2010-05-07 | Payer: Self-pay | Admitting: Emergency Medicine

## 2010-05-09 ENCOUNTER — Ambulatory Visit (HOSPITAL_BASED_OUTPATIENT_CLINIC_OR_DEPARTMENT_OTHER): Payer: 59 | Admitting: Hematology & Oncology

## 2010-05-09 LAB — CBC WITH DIFFERENTIAL (CANCER CENTER ONLY)
BASO#: 0 10*3/uL (ref 0.0–0.2)
BASO%: 0.3 % (ref 0.0–2.0)
EOS%: 1.3 % (ref 0.0–7.0)
Eosinophils Absolute: 0.1 10*3/uL (ref 0.0–0.5)
HCT: 32.8 % — ABNORMAL LOW (ref 34.8–46.6)
HGB: 11.1 g/dL — ABNORMAL LOW (ref 11.6–15.9)
LYMPH#: 1.3 10*3/uL (ref 0.9–3.3)
LYMPH%: 26.7 % (ref 14.0–48.0)
MCH: 35.4 pg — ABNORMAL HIGH (ref 26.0–34.0)
MCHC: 33.9 g/dL (ref 32.0–36.0)
MCV: 105 fL — ABNORMAL HIGH (ref 81–101)
MONO#: 0.2 10*3/uL (ref 0.1–0.9)
MONO%: 4.9 % (ref 0.0–13.0)
NEUT#: 3.3 10*3/uL (ref 1.5–6.5)
NEUT%: 66.8 % (ref 39.6–80.0)
Platelets: 180 10*3/uL (ref 145–400)
RBC: 3.14 10*6/uL — ABNORMAL LOW (ref 3.70–5.32)
RDW: 11.2 % (ref 10.5–14.6)
WBC: 4.9 10*3/uL (ref 3.9–10.0)

## 2010-05-09 LAB — CHCC SATELLITE - SMEAR

## 2010-05-12 ENCOUNTER — Encounter: Admission: RE | Admit: 2010-05-12 | Discharge: 2010-05-12 | Payer: Self-pay | Admitting: Obstetrics and Gynecology

## 2010-05-12 LAB — ERYTHROPOIETIN: Erythropoietin: 66.3 m[IU]/mL — ABNORMAL HIGH (ref 2.6–34.0)

## 2010-05-12 LAB — COMPREHENSIVE METABOLIC PANEL
ALT: 10 U/L (ref 0–35)
AST: 16 U/L (ref 0–37)
Albumin: 4.8 g/dL (ref 3.5–5.2)
Alkaline Phosphatase: 49 U/L (ref 39–117)
BUN: 19 mg/dL (ref 6–23)
CO2: 23 mEq/L (ref 19–32)
Calcium: 9.3 mg/dL (ref 8.4–10.5)
Chloride: 106 mEq/L (ref 96–112)
Creatinine, Ser: 1.05 mg/dL (ref 0.40–1.20)
Glucose, Bld: 94 mg/dL (ref 70–99)
Potassium: 4.4 mEq/L (ref 3.5–5.3)
Sodium: 141 mEq/L (ref 135–145)
Total Bilirubin: 0.3 mg/dL (ref 0.3–1.2)
Total Protein: 6.7 g/dL (ref 6.0–8.3)

## 2010-05-12 LAB — VITAMIN D 25 HYDROXY (VIT D DEFICIENCY, FRACTURES): Vit D, 25-Hydroxy: 64 ng/mL (ref 30–89)

## 2010-05-12 LAB — RETICULOCYTES (CHCC)
ABS Retic: 63 10*3/uL (ref 19.0–186.0)
RBC.: 3.15 MIL/uL — ABNORMAL LOW (ref 3.87–5.11)
Retic Ct Pct: 2 % (ref 0.4–3.1)

## 2010-05-12 LAB — LACTATE DEHYDROGENASE: LDH: 189 U/L (ref 94–250)

## 2010-05-12 LAB — FERRITIN: Ferritin: 321 ng/mL — ABNORMAL HIGH (ref 10–291)

## 2010-07-27 ENCOUNTER — Encounter: Payer: Self-pay | Admitting: Obstetrics and Gynecology

## 2010-07-27 ENCOUNTER — Encounter: Payer: Self-pay | Admitting: Hematology & Oncology

## 2010-09-16 LAB — URINE CULTURE
Colony Count: >=100000
Culture  Setup Time: 201111020325

## 2010-09-16 LAB — URINALYSIS, ROUTINE W REFLEX MICROSCOPIC
Bilirubin Urine: NEGATIVE
Glucose, UA: NEGATIVE mg/dL
Ketones, ur: NEGATIVE mg/dL
Nitrite: NEGATIVE
Protein, ur: 30 mg/dL — AB
Specific Gravity, Urine: 1.021 (ref 1.005–1.030)
Urobilinogen, UA: 0.2 mg/dL (ref 0.0–1.0)
pH: 5.5 (ref 5.0–8.0)

## 2010-09-16 LAB — URINE MICROSCOPIC-ADD ON

## 2010-10-08 LAB — RAPID STREP SCREEN (MED CTR MEBANE ONLY): Streptococcus, Group A Screen (Direct): NEGATIVE

## 2010-10-10 ENCOUNTER — Encounter: Payer: 59 | Admitting: Hematology & Oncology

## 2010-10-10 DIAGNOSIS — C8589 Other specified types of non-Hodgkin lymphoma, extranodal and solid organ sites: Secondary | ICD-10-CM

## 2010-10-10 DIAGNOSIS — D638 Anemia in other chronic diseases classified elsewhere: Secondary | ICD-10-CM

## 2010-10-10 DIAGNOSIS — M069 Rheumatoid arthritis, unspecified: Secondary | ICD-10-CM

## 2010-10-10 LAB — CBC WITH DIFFERENTIAL (CANCER CENTER ONLY)
BASO#: 0 10*3/uL (ref 0.0–0.2)
BASO%: 0.2 % (ref 0.0–2.0)
EOS%: 0.5 % (ref 0.0–7.0)
Eosinophils Absolute: 0 10*3/uL (ref 0.0–0.5)
HCT: 33.5 % — ABNORMAL LOW (ref 34.8–46.6)
HGB: 11.6 g/dL (ref 11.6–15.9)
LYMPH#: 1.5 10*3/uL (ref 0.9–3.3)
LYMPH%: 23.4 % (ref 14.0–48.0)
MCH: 35.4 pg — ABNORMAL HIGH (ref 26.0–34.0)
MCHC: 34.6 g/dL (ref 32.0–36.0)
MCV: 102 fL — ABNORMAL HIGH (ref 81–101)
MONO#: 0.3 10*3/uL (ref 0.1–0.9)
MONO%: 5.4 % (ref 0.0–13.0)
NEUT#: 4.4 10*3/uL (ref 1.5–6.5)
NEUT%: 70.5 % (ref 39.6–80.0)
Platelets: 166 10*3/uL (ref 145–400)
RBC: 3.28 10*6/uL — ABNORMAL LOW (ref 3.70–5.32)
RDW: 13 % (ref 11.1–15.7)
WBC: 6.3 10*3/uL (ref 3.9–10.0)

## 2010-10-10 LAB — COMPREHENSIVE METABOLIC PANEL
ALT: 16 U/L (ref 0–35)
AST: 18 U/L (ref 0–37)
Albumin: 4.4 g/dL (ref 3.5–5.2)
Alkaline Phosphatase: 43 U/L (ref 39–117)
BUN: 16 mg/dL (ref 6–23)
CO2: 25 mEq/L (ref 19–32)
Calcium: 10.1 mg/dL (ref 8.4–10.5)
Chloride: 105 mEq/L (ref 96–112)
Creatinine, Ser: 0.75 mg/dL (ref 0.40–1.20)
Glucose, Bld: 114 mg/dL — ABNORMAL HIGH (ref 70–99)
Potassium: 3.7 mEq/L (ref 3.5–5.3)
Sodium: 142 mEq/L (ref 135–145)
Total Bilirubin: 0.4 mg/dL (ref 0.3–1.2)
Total Protein: 6.3 g/dL (ref 6.0–8.3)

## 2010-10-10 LAB — LACTATE DEHYDROGENASE: LDH: 212 U/L (ref 94–250)

## 2010-10-10 LAB — CHCC SATELLITE - SMEAR

## 2010-11-18 NOTE — Procedures (Signed)
DUPLEX DEEP VENOUS EXAM - UPPER EXTREMITY   INDICATION:  Followup right internal jugular vein DVT.   HISTORY:  Edema:  No.  Trauma/Surgery:  Right Port-A-Cath.  Pain:  No.  PE:  No.  Previous DVT:  Right IJ 11/2007.  Anticoagulants:  Coumadin.  Other:   DUPLEX EXAM:                                             Bas/                IJV   SCV     AXV    BrachV  Ceph V                R  L  R   L   R  L   R   L   R  L  Thrombosis    0  0  0       0      0       0  Spontaneous   +  +  +       +      +       +  Phasic        +  +  +       +      +       +  Augmentation  +  +  +       +      +       +  Compressible  +  +  +       +      +       +  Competent     +  +  +       +      +       +  Legend:  + - yes  o - no  p - partial  D - decreased   IMPRESSION:  1. No evidence of DVT in right upper extremity or left internal      jugular vein.  2. Port-A-Cath noted in right IJV.    ___________________________________________  Jessy Oto. Fields, MD   AS/MEDQ  D:  01/04/2008  T:  01/04/2008  Job:  [id number]

## 2010-11-18 NOTE — Assessment & Plan Note (Signed)
OFFICE VISIT   Helen Hardy, ARCOS A  DOB:  1950-08-12                                       02/29/2008  CHART#:[id number]   The patient returns for followup today after removal of her Port-A-Cath  a few weeks ago.  She had essentially no postoperative pain.   On exam today the incision is well-healed.  There is no evidence of  infection.  However, she did complain of some intermittent numbness and  tingling in her left foot which she said has been present for a couple  weeks.  On inspection both lower extremities she has no significant  edema.  She has 2+ palpable pulses in the right femoral and right  dorsalis pedis pulse in the right foot.  On the left side she has a 2+  left femoral pulse but absent popliteal with absent dorsalis pedis and  posterior tibial pulse on the left side.  In light of this we did  bilateral ABIs and these were actually normal with triphasic waveforms  bilaterally.  She is followed by a rheumatologist for chronic rheumatoid  arthritis in her knees and feet and states that she is planning on  seeing a podiatrist in the near future regarding some spurs in her feet.  If she does require any procedures on her foot the circulation should be  adequate for healing of these.  As far as her Port-A-Cath is concerned  she will follow up on an as-needed basis.   Jessy Oto. Fields, MD  Electronically Signed   CEF/MEDQ  D:  02/29/2008  T:  03/01/2008  Job:  [id number]   cc:   Lynnell Chad. [name], M.D.  Rudell Cobb. Marin Olp, M.D.

## 2010-11-18 NOTE — Op Note (Signed)
NAME:  Helen Hardy, Helen Hardy           ACCOUNT NO.:  [id number]   MEDICAL RECORD NO.:  [id number]          PATIENT TYPE:  AMB   LOCATION:  SDS                          FACILITY:  [facility]   PHYSICIAN:  Charles E. Fields, MD  DATE OF BIRTH:  03-Dec-1950   DATE OF PROCEDURE:  02/09/2008  DATE OF DISCHARGE:                               OPERATIVE REPORT   PROCEDURE:  Removal of Port-A-Cath.   PREOPERATIVE DIAGNOSIS:  Nonfunctional Port-A-Cath.   POSTOPERATIVE DIAGNOSIS:  Nonfunctional Port-A-Cath.   ANESTHESIA:  Local with IV sedation.   ASSISTANT:  Nurse.   OPERATIVE DETAIL:  After obtaining informed consent, the patient was  taken to the operating room.  The patient was placed in supine position  on the operating table.  After adequate sedation, the patient's entire  right chest and neck were prepped and draped in usual sterile fashion.  Transverse incision was made on the right chest wall over a preexisting  scar.  Incision was carried down through the subcutaneous tissues down  to the level of the Port-A-Cath.  Port-A-Cath was elevated up in the  operative field with a hemostat.  This was freed up from its surrounding  capsule.  There was no obvious evidence of infection.  Port-A-Cath was  then removed intact within its entirety.  Hemostasis was then obtained  by holding direct pressure on the base of the neck and infraclavicular  region for approximately 5 minutes.  The wound was thoroughly irrigated  with normal saline solution.  Subcutaneous tissues were reapproximated  using running 3-0 Vicryl suture.  Skin was closed with a 4-0 Vicryl  subcuticular stitch.  The patient tolerated the procedure well and there  were no complications.  Instrument, sponge, and needle counts were  correct at the end of the case.  The patient was taken to the recovery  room in stable condition.      Jessy Oto. Fields, MD  Electronically Signed     CEF/MEDQ  D:  02/09/2008  T:  02/09/2008  Job:   [id number]

## 2010-11-18 NOTE — Assessment & Plan Note (Signed)
OFFICE VISIT   NELSON, JULSON A  DOB:  01/09/1951                                       01/04/2008  CHART#:[id number]   The patient is a 60 year old female who previously had a Port-A-Cath  placed on the right side in 2005 for treatment for Hodgkin's lymphoma.  She has not required use of the Port-A-Cath since that time.  She  apparently had an ultrasound recently which showed some evidence of  thrombus around the Port-A-Cath.  She was subsequently started on  Coumadin for this.  She subsequently has had two ultrasounds and a CT  scan which showed no residual thrombus.  She has now been on the  Coumadin for approximately 2 months.  She has no upper extremity  swelling.  She has no collateralization.  She has no facial edema.   PAST SURGICAL HISTORY:  Remarkable for bilateral knee replacement,  Nissen procedure, hysterectomy, cystocele repair and left axillary node  biopsy.   PAST MEDICAL HISTORY:  Is remarkable for rheumatoid arthritis and  hypertension.   FAMILY HISTORY:  Unremarkable.   SOCIAL HISTORY:  She is married and has three children.  She is a  nonsmoker and nonconsumer of alcohol.   REVIEW OF SYSTEMS:  VITAL SIGNS:  She is 5 feet 1, 253 pounds.  CONSTITUTIONAL:  She has had some weight gain recently.  She has a  history of reflux.  NEUROLOGIC:  She has a history of headaches.  ORTHOPEDICS:  She has multiple joint arthritis pain.  HEENT:  She has some decreased visual acuity recently.  Hematologic, psychiatric, renal, pulmonary and cardiac review of systems  are all negative.   MEDICATIONS:  1. Include Avalide 300/12.5 1/2 tablet once a day.  2. Valtrex 1 gram 1/2 tablet once a day.  3. Estradiol 1 mg once a day.  4. Protonix 40 mg two a day.  5. Coumadin 5 mg once a day.  6. Prednisone 5 mg 1-1/2 once a day.  7. Hydrocodone 5/500 two a day.  8. Folate 800 mg once a day.  9. Multivitamin.   ALLERGIES:  She has no known drug  allergies.   PHYSICAL EXAM:  Vital signs:  Blood pressure is 125/67, heart rate is 79  and regular.  HEENT:  Is unremarkable.  Neck:  There is a Port-A-Cath  palpable on the right internal jugular vein.  Chest:  She has a palpable  Port-A-Cath box on the right anterior chest wall with a well-healed  scar.  Chest:  Clear to auscultation.  Cardiac:  Exam is regular rate  and rhythm.  Abdomen:  Is obese, soft, nontender, nondistended with no  masses.   She had a duplex ultrasound in our office today which showed no residual  clot around the Port-A-Cath.   I believe the best option for the patient is continued anticoagulation  therapy for a total of 3 months.  We will then remove her Port-A-Cath in  the operating room at [facility].  We will stop her Coumadin on August  1.  The Port-A-Cath removal is schedule for August 6.  Procedure  details, risks, benefits and possible complications were explained to  the patient today.  She understands and agrees to proceed.   Jessy Oto. Fields, MD  Electronically Signed   CEF/MEDQ  D:  01/04/2008  T:  01/05/2008  Job:  [id number]   cc:   Lynnell Chad. [name], M.D.  Rudell Cobb. Marin Olp, M.D.

## 2010-11-21 NOTE — Op Note (Signed)
NAME:  Helen Hardy, Helen Hardy           ACCOUNT NO.:  [id number]   MEDICAL RECORD NO.:  [id number]          PATIENT TYPE:  AMB   LOCATION:  ENDO                         FACILITY:  [facility]   PHYSICIAN:  Waverly Ferrari, M.D.    DATE OF BIRTH:  Oct 29, 1950   DATE OF PROCEDURE:  10/22/2004  DATE OF DISCHARGE:                                 OPERATIVE REPORT   PROCEDURE:  Upper endoscopy with biopsy.   INDICATIONS:  Gastroesophageal reflux disease.   ANESTHESIA:  Demerol 60, Versed 8 mg.   DESCRIPTION OF PROCEDURE:  With the patient mildly sedated in the left  lateral decubitus position, the Olympus videoscopic endoscope was inserted  in the mouth and passed under direct vision through the esophagus which  appeared normal. I biopsied around the perimeter of the squamocolumnar  junction. We then entered into the stomach. The fundus, body, antrum,  duodenal bulb, and second portion of duodenum were visualized.  From this  point, the endoscope was slowly withdrawn taking circumferential views of  duodenal mucosa until the endoscope had been pulled back in the stomach,  placed in retroflexion to view the stomach from below. The endoscope was  straightened and withdrawn taking circumferential views of the remaining  gastric and esophageal mucosa. The patient's vital signs and pulse oximeter  remained stable. The patient tolerated the procedure well without apparent  complications.   FINDINGS:  Hiatal hernia, biopsy of distal esophagus. Await biopsy report.  The patient will call me for results and follow-up with me as an outpatient      GMO/MEDQ  D:  10/22/2004  T:  10/22/2004  Job:  [id number]

## 2010-11-21 NOTE — Op Note (Signed)
NAME:  Helen Hardy, Helen Hardy           ACCOUNT NO.:  [id number]   MEDICAL RECORD NO.:  [id number]          PATIENT TYPE:  AMB   LOCATION:  ENDO                         FACILITY:  [facility]   PHYSICIAN:  Waverly Ferrari, M.D.    DATE OF BIRTH:  09-29-1950   DATE OF PROCEDURE:  05/31/2006  DATE OF DISCHARGE:                                 OPERATIVE REPORT   SURGEON:  Waverly Ferrari, M.D.   PROCEDURE:  Upper endoscopy.   INDICATIONS:  GERD.   ANESTHESIA:  Fentanyl 75 mcg, Versed 7.5 mg.   PROCEDURE:  With the patient mildly sedated in the left lateral decubitus  position, the Olympus videoscopic endoscope was inserted in the mouth and  passed under direct vision through the esophagus, which appeared normal,  into a hiatal hernia.  We entered into the stomach.  The fundus, body,  antrum, duodenal bulb and second portion of the duodenum were normal.  From  this point, the endoscope was slowly withdrawn, taking circumferential views  of the duodenal mucosa, until the endoscope was then pulled back into the  stomach and placed in retroflexion to view the stomach from below, and a  hiatal hernia was once again seen.  The endoscope was then straightened and  withdrawn, taking circumferential views of the remaining gastric and  esophageal mucosa.  The patient's vital signs and pulse oximetry remained  stable.  The patient tolerated the procedure well without apparent  complication.   FINDINGS:  Hiatal hernia, otherwise, an unremarkable examination.   PLAN:  Proceed to colonoscopy.           ______________________________  Waverly Ferrari, M.D.     GMO/MEDQ  D:  05/31/2006  T:  05/31/2006  Job:  [id number]

## 2010-11-21 NOTE — Procedures (Signed)
[place]  Patient:    CITLALLY, CAPTAIN Visit Number: [id number] MRN: [id number]          Service Type: END Location: ENDO Attending Physician:  Jim Desanctis Dictated by:   Jim Desanctis, M.D. Proc. Date: 03/16/01 Admit Date:  03/16/2001                             Procedure Report  PROCEDURE:  Upper Endoscopy.  GASTROENTEROLOGIST:  Jim Desanctis, M.D.  INDICATIONS:  GERD.  ANESTHESIA:  Demerol 50 mg, Versed 5 mg.  PROCEDURE IN DETAIL:  With the patient mildly sedated and in the left lateral decubitus position, the Olympus video endoscope was inserted in the mouth, passed under direct vision through the esophagus.  The distal esophagus was approached, and there was a questionable area of Barretts esophagus photographed, and biopsied.  We entered into the stomach.  Fundus, body, antrum, duodenal bulb, second portion of duodenum all appeared normal.  From this point, the endoscope was slowly withdrawn, taking circumferential views of entire duodenal mucosa until the endoscope had been pulled back into the stomach, placed in retroflexion to view the stomach from below.  The endoscope was then straightened and withdrawn, taking circumferential views of remaining gastric and esophageal mucosa.  The patients vital signs and pulse oximetry remained stable.  The patient tolerated the procedure well with no apparent complications.  FINDINGS: 1. Hiatal hernia with reflux. 2. Question of Barretts esophagus.  PLAN:  Await biopsy report.  The patient will call me with results and follow up with me as an outpatient.  Proceed to colonoscopy as planned. Dictated by:   Jim Desanctis, M.D. Attending Physician:  Jim Desanctis DD:  03/16/01 TD:  03/16/01 Job: [id number]

## 2010-11-21 NOTE — Op Note (Signed)
   NAME:  Helen Hardy, CORTESE                     ACCOUNT NO.:  [id number]   MEDICAL RECORD NO.:  [id number]                   PATIENT TYPE:  AMB   LOCATION:  ENDO                                 FACILITY:  [facility]   PHYSICIAN:  Waverly Ferrari, M.D.                 DATE OF BIRTH:  09/06/1950   DATE OF PROCEDURE:  04/25/2002  DATE OF DISCHARGE:                                 OPERATIVE REPORT   PROCEDURE PERFORMED:  Upper endoscopy.   ENDOSCOPIST:  Waverly Ferrari, M.D.   INDICATIONS FOR PROCEDURE:  Gastroesophageal reflux disease, question of  Barrett's.   ANESTHESIA:  Demerol  60 mg, Versed 5 mg.   DESCRIPTION OF PROCEDURE:  With the patient mildly sedated in the left  lateral decubitus position, the Olympus video endoscope was inserted in the  mouth and passed under direct vision through the esophagus which appeared  normal.  Under close inspection, I did not see any area that I felt was  consistent with Barrett's esophagus, but we biopsied the distal most  esophagus at the gastroesophageal junction, at the squamocolumnar junction.  On entering into the stomach through a  hiatal hernia, the fundus, body,  antrum, duodenal bulb and second portion of the duodenum all appeared  normal.  From this point, the endoscope was slowly withdrawn taking  circumferential views of the entire duodenal mucosa until the endoscope was  pulled back into the stomach and placed on retroflexion to view the stomach  from below and it was noted that there was an incomplete wrap of the GE  junction around the endoscope and this was photographed.  The endoscope was  then straightened and withdrawn taking circumferential views of the  remaining gastric and esophageal mucosa.  The patient's vital signs and  pulse oximeter remained stable.  The patient tolerated the procedure well  without apparent complications.   FINDINGS:  Small  hiatal hernia.  Otherwise unremarkable exam.   PLAN:  Await biopsy report.   Patient will call me for results and follow up  with me as an outpatient.                                               Waverly Ferrari, M.D.    GMO/MEDQ  D:  04/25/2002  T:  04/25/2002  Job:  [id number]

## 2010-11-21 NOTE — H&P (Signed)
NAME:  Helen Hardy, QUINBY           ACCOUNT NO.:  [id number]   MEDICAL RECORD NO.:  [id number]          PATIENT TYPE:  INP   LOCATION:  0279                         FACILITY:  [facility]   PHYSICIAN:  Rudell Cobb. Marin Olp, M.D. DATE OF BIRTH:  08-26-1950   DATE OF ADMISSION:  05/09/2004  DATE OF DISCHARGE:                                HISTORY & PHYSICAL   DIAGNOSES:  1.  Systemic lymphadenopathy.  2.  Rheumatoid arthritis.   [name] is a 60 year old African-American female with a 20 year  history of rheumatoid arthritis, who is followed by Dr. Justine Null.  She has been  taking methotrexate and prednisone weekly/daily, respectively.  She has  known chronic lymphadenopathy in the axilla on previous mammograms.  Her  path reports revealed non-Hodgkin's lymphoma, showing B-cell non-Hodgkin's  lymphoma.  Cytogenics consistent with normal finding; therefore, there is no  cytogenic evidence of mantle cell lymphoma.  Pathology report number is  [phone].  Immunohistochemical stains revealed a CD79A weakly positive,  CD43 positive, CD20 positive, CD5 positive, BCL-2 positive, and cyclin D1  high background difficult to interpret.  Helen Hardy has most recently started  chemotherapy.  She is status post Rituxan, Cytoxan, and fludarabine.  The  past week has been quite difficult for Helen Hardy.   She has had herpes simplex diagnosed.  She has ended up in the emergency  room with severe chest pain, found to have a right pleural mass.  She has  also been quite anemic with hemoglobin hovering around 8.  She comes in the  office today with a temp of 102+.  We will admit for IV antibiotics and  further work-up.   PAST MEDICAL HISTORY:  1.  Rheumatoid arthritis.  2.  Hypertension.  3.  GERD.  4.  Bladder repair.  5.  Bilateral knee replacements.   ALLERGIES:  No known drug allergies.   CURRENT MEDICATIONS:  1.  Micardis/hydrochlorothiazide 80/12.5, 1/2 p.o. daily.  2.  Methotrexate 2.5 mg 5 tabs p.o. q  Friday.  3.  Protonix 40 mg p.o. b.i.d.  4.  Reglan 10 mg p.o. q.i.d.  5.  Prednisone 10.5 mg p.o. daily.  6.  Premarin 0.625 mg daily.  7.  Aspirin 325 mg p.o. daily.  8.  Hydrocodone p.r.n. pain.   SOCIAL HISTORY:  Helen Hardy is married.  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  She denies tobacco or alcohol use.   FAMILY HISTORY:  Family is negative for cancer.  There is history of  diabetes, hypertension, and CVA.   REVIEW OF SYSTEMS:  As noted in history of present illness.  She has had  fever and sweats at night.  No headaches.  Severe chest pain on the right,  pleuritic in nature, shaking chills and fever.  Some nausea today with  vomiting.  Stools have been somewhat constipated.  Extremities with no  cyanosis, clubbing, or edema.  Port-A-Cath is without erythema.   PHYSICAL EXAMINATION:  GENERAL:  This is an obese African-American female  who appears ill.  She alert and oriented.  VITAL SIGNS:  Today's vital signs reveal  temp of 102.2, pulse 114,  respirations 20, blood pressure 127/92.  HEENT:  Normocephalic, atraumatic.  Sclerae is anicteric.  Mucous membranes  are without obvious plaque or lesions.  CHEST:  Chest has a notable crackle in the right lower lobe.  Left lower  lobe is slightly decreased.  ABDOMEN:  Soft, nontender.  EXTREMITIES:  Without cyanosis, clubbing, or edema.  NEUROLOGIC:  Nonfocal.   LABORATORY DATA:  Labs currently not available.  CBC on October 31:  WBC  4.1, ANC 2.2, hemoglobin 9, hematocrit 26.3; platelets are at 83.   ASSESSMENT AND PLAN:  1.  Non-Hodgkin's lymphoma, status post Cytoxan, fludarabine, on April 17, 2004.  Last Rituxan given on April 14, 2004.  2.  Pneumonia.  We will start IV fluids, Maxipime, and azithromycin.  We      will do two blood cultures prior to starting antibiotics, one from the      port, one peripherally.  3.  Pain.  Severe right pleuritic pain over the past 2 days.  We  will begin      morphine sulfate 2-4 mg IV q.2h.  4.  Nausea and vomiting.  Oncology standing orders as well as Phenergan 25      mg p.o. q.4h.  5.  Fever secondary to pneumonia.  We will recheck chest film.  CT revealed      a right lower lobe mass.  6.  Anemia.  Type, cross, and transfuse for 2 units of packed red blood      cells.  7.  Herpes zoster with anal involvement.  We will continue antivirals.  8.  Constipation.  We will order stool softeners if indicated.     Bethena Roys   JB/MEDQ  D:  05/09/2004  T:  05/09/2004  Job:  [id number]   cc:   Michael Litter, M.D.  [address]  [place]  [place] [address]  Fax: [place]. [name], M.D.  [place]  [place] [address]  Fax: [place]. Dalbert Batman, M.D.  [address], [place] [address]  Fax: [phone]   Bernestine Amass, M.D.  [place]. [address]  [place] [address]  Fax: [phone]

## 2010-11-21 NOTE — Op Note (Signed)
[place]. [facility]  Patient:    Helen Hardy, Helen Hardy Visit Number: [id number] MRN: [id number]          Service Type: END Location: ENDO Attending Physician:  Jim Desanctis Proc. Date: 03/16/01 Admit Date:  03/16/2001                             Operative Report  PROCEDURE:  Colonoscopy.  INDICATIONS:  Rectal bleeding.  ANESTHESIA:  Demerol 15 mg, Versed none.  DESCRIPTION OF PROCEDURE:  With the patient mildly sedated in the left lateral decubitus position, the Olympus videoscopic colonoscope was inserted into the rectum and passed under direct vision to the cecum identified by ileocecal valve and appendiceal orifice.  Both of which were photographed.  From this point, the colonoscope was slowly withdrawn taking circumferential views of the entire colonic mucosa stopping only in the rectum which appeared normal in direct view and showed no hemorrhoids on retroflexed view.  The endoscope was straightened and withdrawn.  The patients vital signs and pulse oximetry remained stable.  The patient tolerated the procedure well without apparent complications.  FINDINGS:  Internal hemorrhoids, otherwise unremarkable colonoscopic examination.  PLAN:  See endoscopy note for further details. Attending Physician:  Jim Desanctis DD:  03/16/01 TD:  03/16/01 Job: [id number]

## 2010-11-21 NOTE — Op Note (Signed)
NAME:  ERIAL, FIKES                     ACCOUNT NO.:  [id number]   MEDICAL RECORD NO.:  [id number]                   PATIENT TYPE:  AMB   LOCATION:  DAY                                  FACILITY:  [facility] [place]:  Edsel Petrin. Dalbert Batman, M.D.             DATE OF BIRTH:  01-15-1951   DATE OF PROCEDURE:  02/13/2004  DATE OF DISCHARGE:                                 OPERATIVE REPORT   PREOPERATIVE DIAGNOSIS:  Chronic adenopathy, rule out lymphoma.   POSTOPERATIVE DIAGNOSIS:  Chronic adenopathy, rule out lymphoma.   OPERATION PERFORMED:  Excision biopsy, left axillary lymph node.   SURGEON:  Edsel Petrin. Dalbert Batman, M.D.   OPERATIVE INDICATIONS:  This is a 60 year old black female with severe  rheumatoid arthritis.  She has had significant bilateral axillary adenopathy  for some time, and some new adenopathy has been detected in the neck.  CT  scan shows some iliac adenopathy as well.  On exam, I can feel significantly  enlarged, nontender lymph nodes in both axilla.  These are mobile and  nonfixed, and there is no overlying skin change.  Dr. Burney Gauze has  requested lymph node biopsy to rule out lymphoma.  Patient is brought to the  operating room electively.   OPERATIVE TECHNIQUE:  Following the induction of general endotracheal  anesthesia, the patient's left axilla was prepped and draped in a sterile  fashion.  Marcaine 0.5% with epinephrine was used as a local infiltration  anesthetic.  A transverse incision was made at the hair line.  Dissection  was carried down through the subcutaneous tissue.  The clavipectoral fascia  was incised, and the axillary space was entered.  I found a lymph node about  2.5 to 3 cm in diameter, which was very smooth, mobile, and not attached to  any other structure.  I simply dissected this away from the surrounding  tissues, controlling small bleeding vessels with electrocautery.  This was  sent fresh for routine histology with the attached  history.  The wound was  irrigated with saline.  Hemostasis was excellent and achieved with  electrocautery.  The subcutaneous tissues were closed with interrupted  sutures of 3-0 Vicryl.  The skin was closed with a running subcuticular  suture of 4-0 Monocryl and Steri-Strips.  Clean bandages were placed, and  the patient was taken to the recovery room in stable condition.  Estimated  blood loss was about 10 cc.   COMPLICATIONS:  None.   SPONGE, NEEDLE, INSTRUMENT COUNTS:  Correct.                                               Edsel Petrin. Dalbert Batman, M.D.    HMI/MEDQ  D:  02/13/2004  T:  02/13/2004  Job:  [id number]   cc:   Rudell Cobb. Marin Olp, M.D.  [place] [place]  [place], [place] [address]  Fax: [phone]   W. Buddy Duty, M.D.  [place]  [place] [address]  Fax: [phone]

## 2010-11-21 NOTE — Op Note (Signed)
NAME:  Helen Hardy, Helen Hardy           ACCOUNT NO.:  [id number]   MEDICAL RECORD NO.:  [id number]          PATIENT TYPE:  AMB   LOCATION:  ENDO                         FACILITY:  [facility]   PHYSICIAN:  Waverly Ferrari, M.D.    DATE OF BIRTH:  06-19-1951   DATE OF PROCEDURE:  05/31/2006  DATE OF DISCHARGE:                                 OPERATIVE REPORT   PROCEDURE:  Colonoscopy.   INDICATIONS:  Hemoccult positivity.   ANESTHESIA:  Versed 2.5 mg.   PROCEDURE:  With the patient mildly sedated in the left lateral decubitus  position the Olympus videoscopic colonoscope was inserted into the rectum,  passed through a somewhat tortuous distal colon but after straightening the  scope we were able to then advance rather easily to cecum identified by the  ileocecal valve and appendiceal orifice both of which were photographed from  this point.  The colonoscope was slowly withdrawn taking circumferential  views of the colonic mucosa stopping only in the rectum which appeared  normal on direct and showed hemorrhoids on the retroflexed view.  The  endoscope was straightened and withdrawn.  The patient's vital signs, pulse  oximeter remained stable.  The patient tolerated the procedure well without  apparent complications.   FINDINGS:  Rare diverticulum of the sigmoid colon, tortuosity of the distal  colon.  Internal hemorrhoids were noted.   PLAN:  Repeat examination in 5-10 years.           ______________________________  Waverly Ferrari, M.D.     GMO/MEDQ  D:  05/31/2006  T:  05/31/2006  Job:  [id number]

## 2010-11-21 NOTE — Op Note (Signed)
NAME:  Helen Hardy, Helen Hardy                     ACCOUNT NO.:  [id number]   MEDICAL RECORD NO.:  [id number]                   PATIENT TYPE:  OBV   LOCATION:  4742                                 FACILITY:  [facility]   PHYSICIAN:  Bernestine Amass, M.D.               DATE OF BIRTH:  October 02, 1950   DATE OF PROCEDURE:  11/14/2003  DATE OF DISCHARGE:                                 OPERATIVE REPORT   PREOPERATIVE DIAGNOSES:  1. Cystocele.  2. Stress urinary incontinence.   POSTOPERATIVE DIAGNOSES:  1. Cystocele.  2. Stress urinary incontinence.   PROCEDURE PERFORMED:  Anterior repair, flexible cystoscopy and pubovaginal  sling.   SURGEON:  Bernestine Amass, M.D.   ANESTHESIA:  General.   INDICATIONS:  The patient is a 60 year old female.  She presented to see me  with urinary incontinence.  We found her to have a grade 2-3 cystocele with  urethral hypermobility.  We initially treated with antispasmodic medications  to see if we could help her urge based leakage.  This did not seem to help  much.  Clinical exam in our office demonstrated a positive Marshall's test  with severe stress incontinence and a positive Marshall's test.  We  consulted the patient about treatment options.  Certainly, a large component  of her incontinence is stress based.  We told her that with correction of  the stress incontinence her urgency and urge incontinence may improve, may  stay the same or unfortunately in rare cases may worsen.  She appeared to  understand the advantages and disadvantages of anti-incontinent surgery.  She understood that there was a risk of urinary retention and the necessity  on occasion for secondary procedures.  We quoted her an 80-90% chance of  success with regard to her stress incontinence and she appeared to  understand other risks generally attributable to surgery.   TECHNIQUE AND FINDINGS:  The patient was brought to the operating room where  she had successful induction of  general anesthesia.  She was placed in a  moderate lithotomy position and prepped and draped in the usual manner.  A  weighted vaginal speculum was utilized.  The patient did have moderate  atrophic vaginal change.  She had a grade 2-3 cystocele.  A Foley catheter  was inserted and the bladder was drained.  The anterior vaginal wall was  infiltrated with some Marcaine.  An incision was then made from mid urethra  back to the scarred cervical cuff.  The vaginal tissues were again atrophic  and somewhat scarred.  We were able to establish reasonable planes and  completely dissect out the cystocele.  The cystocele itself was reduced by  reapproximating some pericervical fascia, reducing the cystocele.  We then  made two small stab incisions just to the lateral aspect of the midline.  With direct digital finger control in the retropubic space, we were able to  pass down the [facility] needle holders  on both sides.  The Foley catheter was  then removed and flexible cystoscopy was performed.  The needles appeared to  be in good position at the bladder neck and there was no evidence of any  bladder perforation.  Blue dye could be seen from both orifices.  The sling  was then positioned in the proximal to mid urethra.  A right-angled clamp  was passed behind the sling for correct tensioning.  The sheaths were then  removed and the sling was confirmed to be in good position.  A moderate  amount of vaginal mucosa which was redundant was trimmed and the vaginal  incision was then closed with a running 2-0 Vicryl suture.  The stab  incisions suprapubically were closed with Dermabond.  The  patient appeared to tolerate the procedure well.  Blood loss was minimal and  there were no obvious complications.  A Foley catheter was left indwelling  and Estrace vaginal packing was utilized.  The patient was brought to the  recovery room in stable condition.                                               Bernestine Amass, M.D.    DSG/MEDQ  D:  11/14/2003  T:  11/14/2003  Job:  [id number]

## 2010-11-21 NOTE — Discharge Summary (Signed)
NAME:  Helen Hardy, Helen Hardy           ACCOUNT NO.:  [id number]   MEDICAL RECORD NO.:  [id number]          PATIENT TYPE:  INP   LOCATION:  0279                         FACILITY:  [facility]   PHYSICIAN:  Rudell Cobb. Marin Olp, M.D. DATE OF BIRTH:  30-Apr-1951   DATE OF ADMISSION:  05/09/2004  DATE OF DISCHARGE:  05/15/2004                                 DISCHARGE SUMMARY   DISCHARGE DIAGNOSES:  1.  Bilateral lower lobe pneumonia.  2.  Neutropenia.  3.  Low-grade non-Hodgkin's lymphoma.  4.  Rheumatoid arthritis.   CONDITION ON DISCHARGE:  Stable.   ACTIVITY:  As tolerated.   DIET:  No restrictions.   FOLLOW UP:  Will see the patient back in one week.   DISCHARGE MEDICATIONS:  1.  Avelox 400 mg p.o. q.d. x5 days.  2.  Prednisone 7.5 mg p.o. q.d.  3.  Protonix 40 mg p.o. b.i.d.  4.  Famvir 250 mg p.o. q.d.  5.  Phenergan 25 mg p.o. q.6h. p.r.n.  6.  Coumadin 1 mg p.o. q.d.   HOSPITAL COURSE:  Helen Hardy was admitted after having a temperature  of 102 degrees.  She was very anemic with a hemoglobin of 8.  Her neutrophil  count was also on the low side.   She was admitted.  Chest x-ray was done, which showed bilateral lower lobe  pneumonia.   She subsequently was started on IV antibiotics.  Her temperatures  defervesced quite nicely.   Cultures were taken, and all were negative.   She was transfused with 2 units of packed red blood cells when she was  admitted.   She improved well.  She was ambulating without difficulty.  Her rheumatoid  arthritis was not causing much in the way of difficulty.   We initially started her on Maxipime and Zithromax.  We subsequently got her  onto oral antibiotics on the 9th with Avelox.  She did well with this.   Her oxygen levels were all adequate throughout her hospital stay.  She  remained afebrile.  Her vital signs were all stable.  She was eating well.  There was no nausea or vomiting.  She had no diarrhea.   I felt that she could go  home and be treated with a five-day course of oral  antibiotics.  This was done on the 10th.   PHYSICAL EXAMINATION:  VITAL SIGNS:  On discharge, her vital signs were  stable.  LUNGS:  Her lungs sounded clear bilaterally.  CARDIAC:  Regular rate and rhythm with no murmurs, rubs, or bruits.  ABDOMEN:  Soft with good bowel sounds.  There is no palpable abdominal mass.  There is no palpable hepatosplenomegaly.  EXTREMITIES:  Changes of her rheumatoid arthritis.  NEUROLOGIC:  No focal neurological deficits.     Pete   PRE/MEDQ  D:  05/15/2004  T:  05/15/2004  Job:  [id number]   cc:   Michael Litter, M.D.  [address]  [place]  [place] [address]  Fax: Watson [name], M.D.  [place]  [place] [address]  Fax: [phone]

## 2010-11-29 ENCOUNTER — Emergency Department (HOSPITAL_COMMUNITY)
Admission: EM | Admit: 2010-11-29 | Discharge: 2010-11-29 | Disposition: A | Discharge status: Home / Self Care | Payer: 59 | Attending: Emergency Medicine | Admitting: Emergency Medicine

## 2010-11-29 DIAGNOSIS — Z9889 Other specified postprocedural states: Secondary | ICD-10-CM | POA: Insufficient documentation

## 2010-11-29 DIAGNOSIS — C8589 Other specified types of non-Hodgkin lymphoma, extranodal and solid organ sites: Secondary | ICD-10-CM | POA: Insufficient documentation

## 2010-11-29 DIAGNOSIS — M069 Rheumatoid arthritis, unspecified: Secondary | ICD-10-CM | POA: Insufficient documentation

## 2010-11-29 DIAGNOSIS — I10 Essential (primary) hypertension: Secondary | ICD-10-CM | POA: Insufficient documentation

## 2010-11-29 DIAGNOSIS — G8918 Other acute postprocedural pain: Secondary | ICD-10-CM | POA: Insufficient documentation

## 2010-11-30 ENCOUNTER — Emergency Department (HOSPITAL_COMMUNITY)
Admission: EM | Admit: 2010-11-30 | Discharge: 2010-11-30 | Disposition: A | Discharge status: Home / Self Care | Payer: 59 | Attending: Emergency Medicine | Admitting: Emergency Medicine

## 2010-11-30 DIAGNOSIS — K219 Gastro-esophageal reflux disease without esophagitis: Secondary | ICD-10-CM | POA: Insufficient documentation

## 2010-11-30 DIAGNOSIS — C8589 Other specified types of non-Hodgkin lymphoma, extranodal and solid organ sites: Secondary | ICD-10-CM | POA: Insufficient documentation

## 2010-11-30 DIAGNOSIS — Z0000 Encounter for general adult medical examination without abnormal findings: Secondary | ICD-10-CM | POA: Insufficient documentation

## 2010-11-30 DIAGNOSIS — M069 Rheumatoid arthritis, unspecified: Secondary | ICD-10-CM | POA: Insufficient documentation

## 2010-11-30 DIAGNOSIS — I10 Essential (primary) hypertension: Secondary | ICD-10-CM | POA: Insufficient documentation

## 2011-02-26 ENCOUNTER — Other Ambulatory Visit: Payer: Self-pay | Admitting: Hematology & Oncology

## 2011-02-26 ENCOUNTER — Encounter (HOSPITAL_BASED_OUTPATIENT_CLINIC_OR_DEPARTMENT_OTHER): Payer: 59 | Admitting: Hematology & Oncology

## 2011-02-26 DIAGNOSIS — C8589 Other specified types of non-Hodgkin lymphoma, extranodal and solid organ sites: Secondary | ICD-10-CM

## 2011-02-26 DIAGNOSIS — M069 Rheumatoid arthritis, unspecified: Secondary | ICD-10-CM

## 2011-02-26 DIAGNOSIS — D638 Anemia in other chronic diseases classified elsewhere: Secondary | ICD-10-CM

## 2011-02-26 LAB — CBC WITH DIFFERENTIAL (CANCER CENTER ONLY)
BASO#: 0 10*3/uL (ref 0.0–0.2)
BASO%: 0.1 % (ref 0.0–2.0)
EOS%: 1 % (ref 0.0–7.0)
Eosinophils Absolute: 0.1 10*3/uL (ref 0.0–0.5)
HCT: 32.4 % — ABNORMAL LOW (ref 34.8–46.6)
HGB: 11.5 g/dL — ABNORMAL LOW (ref 11.6–15.9)
LYMPH#: 2 10*3/uL (ref 0.9–3.3)
LYMPH%: 29.7 % (ref 14.0–48.0)
MCH: 36.1 pg — ABNORMAL HIGH (ref 26.0–34.0)
MCHC: 35.5 g/dL (ref 32.0–36.0)
MCV: 102 fL — ABNORMAL HIGH (ref 81–101)
MONO#: 0.4 10*3/uL (ref 0.1–0.9)
MONO%: 6.4 % (ref 0.0–13.0)
NEUT#: 4.3 10*3/uL (ref 1.5–6.5)
NEUT%: 62.8 % (ref 39.6–80.0)
Platelets: 172 10*3/uL (ref 145–400)
RBC: 3.19 10*6/uL — ABNORMAL LOW (ref 3.70–5.32)
RDW: 13.2 % (ref 11.1–15.7)
WBC: 6.8 10*3/uL (ref 3.9–10.0)

## 2011-02-26 LAB — IRON AND TIBC
%SAT: 34 % (ref 20–55)
Iron: 92 ug/dL (ref 42–145)
TIBC: 273 ug/dL (ref 250–470)
UIBC: 181 ug/dL

## 2011-02-26 LAB — FERRITIN: Ferritin: 246 ng/mL (ref 10–291)

## 2011-02-26 LAB — COMPREHENSIVE METABOLIC PANEL
ALT: 12 U/L (ref 0–35)
AST: 17 U/L (ref 0–37)
Albumin: 4.4 g/dL (ref 3.5–5.2)
Alkaline Phosphatase: 50 U/L (ref 39–117)
BUN: 20 mg/dL (ref 6–23)
CO2: 24 mEq/L (ref 19–32)
Calcium: 9.9 mg/dL (ref 8.4–10.5)
Chloride: 105 mEq/L (ref 96–112)
Creatinine, Ser: 0.85 mg/dL (ref 0.50–1.10)
Glucose, Bld: 101 mg/dL — ABNORMAL HIGH (ref 70–99)
Potassium: 3.9 mEq/L (ref 3.5–5.3)
Sodium: 141 mEq/L (ref 135–145)
Total Bilirubin: 0.4 mg/dL (ref 0.3–1.2)
Total Protein: 6.6 g/dL (ref 6.0–8.3)

## 2011-02-26 LAB — LACTATE DEHYDROGENASE: LDH: 180 U/L (ref 94–250)

## 2011-04-02 LAB — CBC
HCT: 32.5 — ABNORMAL LOW
Hemoglobin: 11.1 — ABNORMAL LOW
MCHC: 34.2
MCV: 107.7 — ABNORMAL HIGH
Platelets: 156
RBC: 3.02 — ABNORMAL LOW
RDW: 14.8
WBC: 4.6

## 2011-04-02 LAB — DIFFERENTIAL
Basophils Absolute: 0
Basophils Relative: 0
Eosinophils Absolute: 0
Eosinophils Relative: 0
Lymphocytes Relative: 33
Lymphs Abs: 1.5
Monocytes Absolute: 0.3
Monocytes Relative: 8
Neutro Abs: 2.7
Neutrophils Relative %: 59

## 2011-04-02 LAB — URINALYSIS, ROUTINE W REFLEX MICROSCOPIC
Bilirubin Urine: NEGATIVE
Glucose, UA: NEGATIVE
Hgb urine dipstick: NEGATIVE
Ketones, ur: NEGATIVE
Nitrite: NEGATIVE
Protein, ur: NEGATIVE
Specific Gravity, Urine: 1.023
Urobilinogen, UA: 0.2
pH: 6

## 2011-04-02 LAB — BASIC METABOLIC PANEL
BUN: 16
CO2: 27
Calcium: 9.2
Chloride: 103
Creatinine, Ser: 0.8
GFR calc Af Amer: >60
GFR calc non Af Amer: >60
Glucose, Bld: 82
Potassium: 3.8
Sodium: 137

## 2011-04-02 LAB — PROTIME-INR
INR: 2.5 — ABNORMAL HIGH
Prothrombin Time: 27.9 — ABNORMAL HIGH

## 2011-04-03 LAB — BASIC METABOLIC PANEL
BUN: 10
CO2: 26
Calcium: 9.5
Chloride: 108
Creatinine, Ser: 0.74
GFR calc Af Amer: >60
GFR calc non Af Amer: >60
Glucose, Bld: 112 — ABNORMAL HIGH
Potassium: 4.2
Sodium: 140

## 2011-04-03 LAB — APTT: aPTT: 27

## 2011-04-03 LAB — CBC
HCT: 32.8 — ABNORMAL LOW
Hemoglobin: 11.1 — ABNORMAL LOW
MCHC: 33.9
MCV: 108.4 — ABNORMAL HIGH
Platelets: 155
RBC: 3.02 — ABNORMAL LOW
RDW: 14.5
WBC: 4.3

## 2011-04-03 LAB — PROTIME-INR
INR: 1.3
Prothrombin Time: 16.7 — ABNORMAL HIGH

## 2011-04-06 ENCOUNTER — Other Ambulatory Visit: Payer: Self-pay | Admitting: Obstetrics and Gynecology

## 2011-04-06 DIAGNOSIS — Z1231 Encounter for screening mammogram for malignant neoplasm of breast: Secondary | ICD-10-CM

## 2011-04-06 LAB — POCT I-STAT, CHEM 8
BUN: 11
Calcium, Ion: 1.2
Chloride: 104
Creatinine, Ser: 0.9
Glucose, Bld: 95
HCT: 32 — ABNORMAL LOW
Hemoglobin: 10.9 — ABNORMAL LOW
Potassium: 3.8
Sodium: 140
TCO2: 27

## 2011-04-06 LAB — CBC
HCT: 31.1 — ABNORMAL LOW
Hemoglobin: 10.7 — ABNORMAL LOW
MCHC: 34.3
MCV: 106.7 — ABNORMAL HIGH
Platelets: 164
RBC: 2.92 — ABNORMAL LOW
RDW: 14.5
WBC: 4.9

## 2011-04-06 LAB — DIFFERENTIAL
Basophils Absolute: 0
Basophils Relative: 0
Eosinophils Absolute: 0
Eosinophils Relative: 1
Lymphocytes Relative: 45
Lymphs Abs: 2.2
Monocytes Absolute: 0.5
Monocytes Relative: 9
Neutro Abs: 2.2
Neutrophils Relative %: 45

## 2011-04-09 LAB — URINALYSIS, ROUTINE W REFLEX MICROSCOPIC
Bilirubin Urine: NEGATIVE
Glucose, UA: NEGATIVE mg/dL
Hgb urine dipstick: NEGATIVE
Ketones, ur: NEGATIVE mg/dL
Nitrite: NEGATIVE
Protein, ur: NEGATIVE mg/dL
Specific Gravity, Urine: 1.027 (ref 1.005–1.030)
Urobilinogen, UA: 0.2 mg/dL (ref 0.0–1.0)
pH: 5.5 (ref 5.0–8.0)

## 2011-04-09 LAB — BASIC METABOLIC PANEL
BUN: 20 mg/dL (ref 6–23)
CO2: 27 mEq/L (ref 19–32)
Calcium: 9.4 mg/dL (ref 8.4–10.5)
Chloride: 107 mEq/L (ref 96–112)
Creatinine, Ser: 0.81 mg/dL (ref 0.4–1.2)
GFR calc Af Amer: >60 mL/min (ref >60)
GFR calc non Af Amer: >60 mL/min (ref >60)
Glucose, Bld: 85 mg/dL (ref 70–99)
Potassium: 4.2 mEq/L (ref 3.5–5.1)
Sodium: 141 mEq/L (ref 135–145)

## 2011-04-20 LAB — BASIC METABOLIC PANEL
BUN: 12
CO2: 26
Calcium: 8.9
Chloride: 107
Creatinine, Ser: 0.71
GFR calc Af Amer: >60
GFR calc non Af Amer: >60
Glucose, Bld: 93
Potassium: 3.6
Sodium: 141

## 2011-04-20 LAB — DIFFERENTIAL
Basophils Absolute: 0
Basophils Relative: 0
Eosinophils Absolute: 0
Eosinophils Relative: 1
Lymphocytes Relative: 25
Lymphs Abs: 0.8
Monocytes Absolute: 0.4
Monocytes Relative: 13 — ABNORMAL HIGH
Neutro Abs: 1.9
Neutrophils Relative %: 61

## 2011-04-20 LAB — URINALYSIS, ROUTINE W REFLEX MICROSCOPIC
Bilirubin Urine: NEGATIVE
Glucose, UA: NEGATIVE
Hgb urine dipstick: NEGATIVE
Ketones, ur: NEGATIVE
Nitrite: NEGATIVE
Protein, ur: NEGATIVE
Specific Gravity, Urine: 1.02
Urobilinogen, UA: 0.2
pH: 6.5

## 2011-04-20 LAB — CBC
HCT: 30.3 — ABNORMAL LOW
Hemoglobin: 10.7 — ABNORMAL LOW
MCHC: 35.3
MCV: 107.3 — ABNORMAL HIGH
Platelets: 143 — ABNORMAL LOW
RBC: 2.82 — ABNORMAL LOW
RDW: 14.8 — ABNORMAL HIGH
WBC: 3.1 — ABNORMAL LOW

## 2011-05-14 ENCOUNTER — Ambulatory Visit
Admission: RE | Admit: 2011-05-14 | Discharge: 2011-05-14 | Disposition: A | Discharge status: Home / Self Care | Payer: 59 | Source: Ambulatory Visit | Attending: Obstetrics and Gynecology | Admitting: Obstetrics and Gynecology

## 2011-05-14 DIAGNOSIS — Z1231 Encounter for screening mammogram for malignant neoplasm of breast: Secondary | ICD-10-CM

## 2011-07-14 DIAGNOSIS — M069 Rheumatoid arthritis, unspecified: Secondary | ICD-10-CM | POA: Diagnosis not present

## 2011-07-21 DIAGNOSIS — I10 Essential (primary) hypertension: Secondary | ICD-10-CM | POA: Diagnosis not present

## 2011-07-21 DIAGNOSIS — R059 Cough, unspecified: Secondary | ICD-10-CM | POA: Diagnosis not present

## 2011-07-21 DIAGNOSIS — K219 Gastro-esophageal reflux disease without esophagitis: Secondary | ICD-10-CM | POA: Diagnosis not present

## 2011-07-21 DIAGNOSIS — R05 Cough: Secondary | ICD-10-CM | POA: Diagnosis not present

## 2011-08-27 ENCOUNTER — Ambulatory Visit (HOSPITAL_BASED_OUTPATIENT_CLINIC_OR_DEPARTMENT_OTHER): Payer: 59 | Admitting: Hematology & Oncology

## 2011-08-27 ENCOUNTER — Other Ambulatory Visit: Payer: 59 | Admitting: Lab

## 2011-08-27 DIAGNOSIS — M069 Rheumatoid arthritis, unspecified: Secondary | ICD-10-CM | POA: Diagnosis not present

## 2011-08-27 DIAGNOSIS — C8589 Other specified types of non-Hodgkin lymphoma, extranodal and solid organ sites: Secondary | ICD-10-CM | POA: Diagnosis not present

## 2011-08-27 DIAGNOSIS — C8590 Non-Hodgkin lymphoma, unspecified, unspecified site: Secondary | ICD-10-CM

## 2011-08-27 DIAGNOSIS — C8599 Non-Hodgkin lymphoma, unspecified, extranodal and solid organ sites: Secondary | ICD-10-CM | POA: Diagnosis not present

## 2011-08-27 DIAGNOSIS — D638 Anemia in other chronic diseases classified elsewhere: Secondary | ICD-10-CM | POA: Diagnosis not present

## 2011-08-27 LAB — CBC WITH DIFFERENTIAL (CANCER CENTER ONLY)
BASO#: 0 10*3/uL (ref 0.0–0.2)
BASO%: 0.1 % (ref 0.0–2.0)
EOS%: 1.6 % (ref 0.0–7.0)
Eosinophils Absolute: 0.1 10*3/uL (ref 0.0–0.5)
HCT: 34.2 % — ABNORMAL LOW (ref 34.8–46.6)
HGB: 11.7 g/dL (ref 11.6–15.9)
LYMPH#: 2.1 10*3/uL (ref 0.9–3.3)
LYMPH%: 28.2 % (ref 14.0–48.0)
MCH: 35.5 pg — ABNORMAL HIGH (ref 26.0–34.0)
MCHC: 34.2 g/dL (ref 32.0–36.0)
MCV: 104 fL — ABNORMAL HIGH (ref 81–101)
MONO#: 0.6 10*3/uL (ref 0.1–0.9)
MONO%: 7.7 % (ref 0.0–13.0)
NEUT#: 4.6 10*3/uL (ref 1.5–6.5)
NEUT%: 62.4 % (ref 39.6–80.0)
Platelets: 187 10*3/uL (ref 145–400)
RBC: 3.3 10*6/uL — ABNORMAL LOW (ref 3.70–5.32)
RDW: 13.3 % (ref 11.1–15.7)
WBC: 7.4 10*3/uL (ref 3.9–10.0)

## 2011-08-27 LAB — CHCC SATELLITE - SMEAR

## 2011-08-27 NOTE — Progress Notes (Signed)
This office note has been dictated.

## 2011-08-28 LAB — LACTATE DEHYDROGENASE: LDH: 183 U/L (ref 94–250)

## 2011-08-28 LAB — COMPREHENSIVE METABOLIC PANEL
ALT: 11 U/L (ref 0–35)
AST: 17 U/L (ref 0–37)
Albumin: 4.1 g/dL (ref 3.5–5.2)
Alkaline Phosphatase: 54 U/L (ref 39–117)
BUN: 21 mg/dL (ref 6–23)
CO2: 26 mEq/L (ref 19–32)
Calcium: 9.5 mg/dL (ref 8.4–10.5)
Chloride: 105 mEq/L (ref 96–112)
Creatinine, Ser: 0.84 mg/dL (ref 0.50–1.10)
Glucose, Bld: 82 mg/dL (ref 70–99)
Potassium: 3.9 mEq/L (ref 3.5–5.3)
Sodium: 142 mEq/L (ref 135–145)
Total Bilirubin: 0.3 mg/dL (ref 0.3–1.2)
Total Protein: 6.5 g/dL (ref 6.0–8.3)

## 2011-08-28 LAB — VITAMIN D 25 HYDROXY (VIT D DEFICIENCY, FRACTURES): Vit D, 25-Hydroxy: 69 ng/mL (ref 30–89)

## 2011-08-28 LAB — RETICULOCYTES (CHCC)
ABS Retic: 79.2 10*3/uL (ref 19.0–186.0)
RBC.: 3.3 MIL/uL — ABNORMAL LOW (ref 3.87–5.11)
Retic Ct Pct: 2.4 % — ABNORMAL HIGH (ref 0.4–2.3)

## 2011-09-01 NOTE — Progress Notes (Signed)
CC:   Lynnell Chad. [name], M.D. Unice Bailey, MD  DIAGNOSES: 1. Diffuse small-cell non-Hodgkin lymphoma, remission. 2. Anemia of chronic disease. 3. Rheumatoid arthritis.  CURRENT THERAPY:  Observation.  INTERIM HISTORY:  Ms. Kozma comes in for followup.  She is doing well.  She is getting by okay.  She does have bad rheumatoid arthritis. I do not think she is on anything new for this.  I think she does take methotrexate for this weekly. She has not had any kind of bleeding problems.  She has had no change in bowel or bladder habits.  She has had no fever, sweats or chills.  PHYSICAL EXAMINATION:  This is an obese black female in no obvious distress.  Vital signs:  Temperature of 97.4, pulse 84, respiratory rate 18, blood pressure 98/60.  Weight is 234.  Head and neck exam shows a normocephalic, atraumatic skull.  There are no ocular or oral lesions. There are no palpable cervical or supraclavicular lymph nodes.  Lungs: Clear bilaterally.  Cardiac:  Regular rate and rhythm with normal S1, S2.  There are no murmurs, rubs or bruits.  Abdomen:  Soft with good bowel sounds.  There is no palpable abdominal mass.  There is no fluid wave.  There is no palpable hepatosplenomegaly.  Back:  No tenderness over the spine, ribs, or hips.  Extremities:  No clubbing, cyanosis or edema.  She does have changes consistent with rheumatoid arthritis.  LABORATORY STUDIES:  White cell count is 7.4, hemoglobin 11.7, hematocrit 34.2, platelet count 187. LDH is 183.  Vitamin D is 69.  IMPRESSION:  Ms. Schwanz is a 61 year old African female with a past history of small-cell lymphocytic lymphoma.  She has not been on treatment now for about 7 years.  So far, I see no evidence of recurrence.  We will plan to get her back in another 6 months.  I do not see any need for radiological studies.    ______________________________ Volanda Napoleon, M.D. PRE/MEDQ  D:  08/31/2011  T:  09/01/2011   Job:  [id number]

## 2011-09-02 ENCOUNTER — Telehealth: Payer: Self-pay | Admitting: *Deleted

## 2011-09-02 NOTE — Telephone Encounter (Signed)
Called patient to let her know that her labwork looks very good per dr. Marin Olp.

## 2011-09-02 NOTE — Telephone Encounter (Signed)
Message copied by Rico Ala on Wed Sep 02, 2011 11:07 AM ------      Message from: Burney Gauze R      Created: Mon Aug 31, 2011  7:01 PM       Call- labs are great!!!  pete

## 2011-11-11 DIAGNOSIS — M069 Rheumatoid arthritis, unspecified: Secondary | ICD-10-CM | POA: Diagnosis not present

## 2011-11-19 DIAGNOSIS — J029 Acute pharyngitis, unspecified: Secondary | ICD-10-CM | POA: Diagnosis not present

## 2011-11-19 DIAGNOSIS — R059 Cough, unspecified: Secondary | ICD-10-CM | POA: Diagnosis not present

## 2011-11-19 DIAGNOSIS — R05 Cough: Secondary | ICD-10-CM | POA: Diagnosis not present

## 2011-11-19 DIAGNOSIS — R5381 Other malaise: Secondary | ICD-10-CM | POA: Diagnosis not present

## 2011-11-19 DIAGNOSIS — R5383 Other fatigue: Secondary | ICD-10-CM | POA: Diagnosis not present

## 2011-11-19 DIAGNOSIS — J209 Acute bronchitis, unspecified: Secondary | ICD-10-CM | POA: Diagnosis not present

## 2011-12-03 DIAGNOSIS — J209 Acute bronchitis, unspecified: Secondary | ICD-10-CM | POA: Diagnosis not present

## 2011-12-03 DIAGNOSIS — R5381 Other malaise: Secondary | ICD-10-CM | POA: Diagnosis not present

## 2011-12-03 DIAGNOSIS — R5383 Other fatigue: Secondary | ICD-10-CM | POA: Diagnosis not present

## 2012-01-15 DIAGNOSIS — M069 Rheumatoid arthritis, unspecified: Secondary | ICD-10-CM | POA: Diagnosis not present

## 2012-01-15 DIAGNOSIS — M159 Polyosteoarthritis, unspecified: Secondary | ICD-10-CM | POA: Diagnosis not present

## 2012-01-29 ENCOUNTER — Other Ambulatory Visit: Payer: Self-pay | Admitting: Hematology & Oncology

## 2012-01-29 ENCOUNTER — Ambulatory Visit (HOSPITAL_BASED_OUTPATIENT_CLINIC_OR_DEPARTMENT_OTHER): Payer: 59 | Admitting: Hematology & Oncology

## 2012-01-29 ENCOUNTER — Other Ambulatory Visit: Payer: 59 | Admitting: Lab

## 2012-01-29 VITALS — BP 126/74 | HR 92 | Temp 97.5°F | Ht 60.0 in | Wt 228.0 lb

## 2012-01-29 DIAGNOSIS — D638 Anemia in other chronic diseases classified elsewhere: Secondary | ICD-10-CM | POA: Diagnosis not present

## 2012-01-29 DIAGNOSIS — M069 Rheumatoid arthritis, unspecified: Secondary | ICD-10-CM | POA: Diagnosis not present

## 2012-01-29 DIAGNOSIS — C8580 Other specified types of non-Hodgkin lymphoma, unspecified site: Secondary | ICD-10-CM

## 2012-01-29 DIAGNOSIS — C8590 Non-Hodgkin lymphoma, unspecified, unspecified site: Secondary | ICD-10-CM

## 2012-01-29 DIAGNOSIS — D649 Anemia, unspecified: Secondary | ICD-10-CM

## 2012-01-29 DIAGNOSIS — C8599 Non-Hodgkin lymphoma, unspecified, extranodal and solid organ sites: Secondary | ICD-10-CM

## 2012-01-29 LAB — CBC WITH DIFFERENTIAL (CANCER CENTER ONLY)
BASO#: 0 10*3/uL (ref 0.0–0.2)
BASO%: 0 % (ref 0.0–2.0)
EOS%: 0.9 % (ref 0.0–7.0)
Eosinophils Absolute: 0.1 10*3/uL (ref 0.0–0.5)
HCT: 34.3 % — ABNORMAL LOW (ref 34.8–46.6)
HGB: 12 g/dL (ref 11.6–15.9)
LYMPH#: 1.6 10*3/uL (ref 0.9–3.3)
LYMPH%: 29.8 % (ref 14.0–48.0)
MCH: 36.3 pg — ABNORMAL HIGH (ref 26.0–34.0)
MCHC: 35 g/dL (ref 32.0–36.0)
MCV: 104 fL — ABNORMAL HIGH (ref 81–101)
MONO#: 0.3 10*3/uL (ref 0.1–0.9)
MONO%: 4.6 % (ref 0.0–13.0)
NEUT#: 3.5 10*3/uL (ref 1.5–6.5)
NEUT%: 64.7 % (ref 39.6–80.0)
Platelets: 178 10*3/uL (ref 145–400)
RBC: 3.31 10*6/uL — ABNORMAL LOW (ref 3.70–5.32)
RDW: 13.2 % (ref 11.1–15.7)
WBC: 5.5 10*3/uL (ref 3.9–10.0)

## 2012-01-29 LAB — LACTATE DEHYDROGENASE: LDH: 190 U/L (ref 94–250)

## 2012-01-29 LAB — IRON AND TIBC
%SAT: 28 % (ref 20–55)
Iron: 82 ug/dL (ref 42–145)
TIBC: 297 ug/dL (ref 250–470)
UIBC: 215 ug/dL (ref 125–400)

## 2012-01-29 LAB — CHCC SATELLITE - SMEAR

## 2012-01-29 LAB — FERRITIN: Ferritin: 292 ng/mL — ABNORMAL HIGH (ref 10–291)

## 2012-01-29 NOTE — Progress Notes (Signed)
This office note has been dictated.

## 2012-01-29 NOTE — Progress Notes (Signed)
CC:   Helen Hardy. Helen Hardy, M.D. Helen Lords, MD  DIAGNOSES: 1. Diffuse small cell non-Hodgkin's lymphoma, clinical remission. 2. Severe rheumatoid arthritis. 3. Anemia of chronic disease.  CURRENT THERAPY:  Observation.  INTERIM HISTORY:  Helen Hardy comes in for followup.  We see her every 6 months.  She is still bothered by her arthritis.  She is trying to get by with this.  This has been quite difficult for her.  She has good days and bad days.  She is on Orencia for this once a month.  She has had no problems bleeding.  There has been no change in bowel or bladder habits.  She is very diligent with getting her mammograms.  She also gets her pelvic exams.  Her last mammogram was done back in November of 2012. Everything looked fine with this.  She has had no fever.  She has had no headache.  There has been no nausea or vomiting.  PHYSICAL EXAMINATION:  General:  This is a well-developed, well- nourished black female in no obvious distress.  Vital signs:  97.5, pulse 92, respiratory rate 22, blood pressure 126/74.  Weight is 228. Head and neck:  Shows a normocephalic, atraumatic skull.  There are no ocular or oral lesions.  There are no palpable cervical or supraclavicular lymph nodes.  Lungs:  Clear to percussion and auscultation bilaterally.  Cardiac:  Regular rate and rhythm with a normal S1 and S2.  There are no murmurs, rubs or bruits.  Abdomen:  Soft with good bowel sounds.  There is no palpable abdominal mass.  There is no palpable hepatosplenomegaly.  Extremities:  Shows no clubbing, cyanosis or edema.  Neurological:  Shows no focal neurological deficits.  LABORATORY STUDIES:  White cell count is 5.5, hemoglobin 12, hematocrit 34.3, platelet count 178.  IMPRESSION:  Helen Hardy is a 61 year old African American female with a past history of low-grade non-Hodgkin's lymphoma.  She has been out of treatment now for about 8 years.  She is doing very, very  well. She is wondering if Rituxan for her arthritis might not be possible.  I told her she can certainly talk to Dr. Ouida Hardy about this.  We will plan to get her back in 6 months.    ______________________________ Helen Hardy, M.D. PRE/MEDQ  D:  01/29/2012  T:  01/29/2012  Job:  [id number]

## 2012-04-05 DIAGNOSIS — Z23 Encounter for immunization: Secondary | ICD-10-CM | POA: Diagnosis not present

## 2012-04-05 DIAGNOSIS — M069 Rheumatoid arthritis, unspecified: Secondary | ICD-10-CM | POA: Diagnosis not present

## 2012-04-08 ENCOUNTER — Other Ambulatory Visit: Payer: Self-pay | Admitting: Obstetrics and Gynecology

## 2012-04-08 DIAGNOSIS — Z1231 Encounter for screening mammogram for malignant neoplasm of breast: Secondary | ICD-10-CM

## 2012-05-05 DIAGNOSIS — M069 Rheumatoid arthritis, unspecified: Secondary | ICD-10-CM | POA: Diagnosis not present

## 2012-05-13 ENCOUNTER — Ambulatory Visit
Admission: RE | Admit: 2012-05-13 | Discharge: 2012-05-13 | Disposition: A | Discharge status: Home / Self Care | Payer: Medicare Other | Source: Ambulatory Visit | Attending: Obstetrics and Gynecology | Admitting: Obstetrics and Gynecology

## 2012-05-13 DIAGNOSIS — Z1231 Encounter for screening mammogram for malignant neoplasm of breast: Secondary | ICD-10-CM | POA: Diagnosis not present

## 2012-05-13 DIAGNOSIS — H2510 Age-related nuclear cataract, unspecified eye: Secondary | ICD-10-CM | POA: Diagnosis not present

## 2012-06-09 DIAGNOSIS — M069 Rheumatoid arthritis, unspecified: Secondary | ICD-10-CM | POA: Diagnosis not present

## 2012-06-27 DIAGNOSIS — I10 Essential (primary) hypertension: Secondary | ICD-10-CM | POA: Diagnosis not present

## 2012-06-30 DIAGNOSIS — M159 Polyosteoarthritis, unspecified: Secondary | ICD-10-CM | POA: Diagnosis not present

## 2012-06-30 DIAGNOSIS — K219 Gastro-esophageal reflux disease without esophagitis: Secondary | ICD-10-CM | POA: Diagnosis not present

## 2012-06-30 DIAGNOSIS — M069 Rheumatoid arthritis, unspecified: Secondary | ICD-10-CM | POA: Diagnosis not present

## 2012-06-30 DIAGNOSIS — I10 Essential (primary) hypertension: Secondary | ICD-10-CM | POA: Diagnosis not present

## 2012-07-07 DIAGNOSIS — D539 Nutritional anemia, unspecified: Secondary | ICD-10-CM | POA: Diagnosis not present

## 2012-07-12 DIAGNOSIS — H2510 Age-related nuclear cataract, unspecified eye: Secondary | ICD-10-CM | POA: Diagnosis not present

## 2012-07-13 DIAGNOSIS — M069 Rheumatoid arthritis, unspecified: Secondary | ICD-10-CM | POA: Diagnosis not present

## 2012-07-14 DIAGNOSIS — M069 Rheumatoid arthritis, unspecified: Secondary | ICD-10-CM | POA: Diagnosis not present

## 2012-07-14 DIAGNOSIS — M79609 Pain in unspecified limb: Secondary | ICD-10-CM | POA: Diagnosis not present

## 2012-07-20 ENCOUNTER — Other Ambulatory Visit: Payer: Self-pay | Admitting: Hematology & Oncology

## 2012-07-20 ENCOUNTER — Ambulatory Visit (HOSPITAL_BASED_OUTPATIENT_CLINIC_OR_DEPARTMENT_OTHER): Payer: Medicare Other | Admitting: Hematology & Oncology

## 2012-07-20 ENCOUNTER — Ambulatory Visit: Payer: Medicare Other | Admitting: Lab

## 2012-07-20 VITALS — BP 120/79 | HR 88 | Temp 97.8°F | Resp 18 | Ht 60.0 in | Wt 246.0 lb

## 2012-07-20 DIAGNOSIS — D638 Anemia in other chronic diseases classified elsewhere: Secondary | ICD-10-CM

## 2012-07-20 DIAGNOSIS — D649 Anemia, unspecified: Secondary | ICD-10-CM

## 2012-07-20 DIAGNOSIS — C8589 Other specified types of non-Hodgkin lymphoma, extranodal and solid organ sites: Secondary | ICD-10-CM

## 2012-07-20 DIAGNOSIS — M069 Rheumatoid arthritis, unspecified: Secondary | ICD-10-CM

## 2012-07-20 DIAGNOSIS — C8580 Other specified types of non-Hodgkin lymphoma, unspecified site: Secondary | ICD-10-CM

## 2012-07-20 LAB — CBC WITH DIFFERENTIAL (CANCER CENTER ONLY)
BASO#: 0 10*3/uL (ref 0.0–0.2)
BASO%: 0.1 % (ref 0.0–2.0)
EOS%: 0.4 % (ref 0.0–7.0)
Eosinophils Absolute: 0 10*3/uL (ref 0.0–0.5)
HCT: 33.7 % — ABNORMAL LOW (ref 34.8–46.6)
HGB: 11.5 g/dL — ABNORMAL LOW (ref 11.6–15.9)
LYMPH#: 1.9 10*3/uL (ref 0.9–3.3)
LYMPH%: 25.4 % (ref 14.0–48.0)
MCH: 35.1 pg — ABNORMAL HIGH (ref 26.0–34.0)
MCHC: 34.1 g/dL (ref 32.0–36.0)
MCV: 103 fL — ABNORMAL HIGH (ref 81–101)
MONO#: 0.3 10*3/uL (ref 0.1–0.9)
MONO%: 3.4 % (ref 0.0–13.0)
NEUT#: 5.3 10*3/uL (ref 1.5–6.5)
NEUT%: 70.7 % (ref 39.6–80.0)
Platelets: 173 10*3/uL (ref 145–400)
RBC: 3.28 10*6/uL — ABNORMAL LOW (ref 3.70–5.32)
RDW: 13.3 % (ref 11.1–15.7)
WBC: 7.4 10*3/uL (ref 3.9–10.0)

## 2012-07-20 LAB — LACTATE DEHYDROGENASE: LDH: 199 U/L (ref 94–250)

## 2012-07-20 NOTE — Progress Notes (Signed)
This office note has been dictated.

## 2012-07-21 NOTE — Progress Notes (Signed)
CC:   Lynnell Chad. [name], M.D. Tobie Lords, MD  DIAGNOSES: 1. Diffuse small-cell non-Hodgkin lymphoma-clinical remission. 2. Rheumatoid arthritis. 3. Anemia of chronic disease.  CURRENT THERAPY:  Observation/therapy for rheumatoid arthritis.  INTERIM HISTORY:  [name] comes in for her followup.  We see her every 6 months.  Since then, she has been doing okay.  She continues on her treatment for her rheumatoid arthritis.  She is on Orencia.  She recently had a Medrol Dose-Pak because of swelling in her left wrist. This helped her quite a bit.  Otherwise, she has had no problems.  She has gained weight.  She feels this is from the prednisone.  There has been no change in bowel or bladder habits.  She has had no bleeding or bruising.  She has had no leg swelling.  PHYSICAL EXAMINATION:  General:  This is an obese black female in no obvious distress.  Vital signs:  Temperature of 97.8, pulse 88, respiratory rate 18, blood pressure 120/79.  Weight is 246.  Head and neck:  Normocephalic, atraumatic skull.  There are no ocular or oral lesions.  There are no palpable cervical or supraclavicular lymph nodes. Lungs:  Clear bilaterally.  Cardiac:  Regular rate and rhythm with a normal S1 and S2.  There are no murmurs, rubs, or bruits.  Abdomen: Soft with good bowel sounds.  There is no palpable abdominal mass. There is no palpable hepatosplenomegaly.  Back:  No tenderness over the spine, ribs, or hips.  Extremities:  Changes consistent with her rheumatoid arthritis.  She has some slight swelling in the left wrist. Skin:  No rashes.  LABORATORY STUDIES:  White cell count is 7.4, hemoglobin 11.5, hematocrit 33.7, platelet count 173.  MCV is 103.  IMPRESSION:  [name] is a 62 year old African American female with a history of diffuse small-cell lymphoma.  She was treated for this.  She has had no problems.  She has had no evidence of evidence of recurrence now for 8-9 years.  We will  go ahead and plan to get her back in 6 more months.  Otherwise, I do not see that she needs any x-rays my point of view.  Her anemia comes and goes.  I think this is clearly related to her rheumatism.    ______________________________ Volanda Napoleon, M.D. PRE/MEDQ  D:  07/20/2012  T:  07/21/2012  Job:  [id number]

## 2012-07-25 DIAGNOSIS — H2510 Age-related nuclear cataract, unspecified eye: Secondary | ICD-10-CM | POA: Diagnosis not present

## 2012-07-25 DIAGNOSIS — H269 Unspecified cataract: Secondary | ICD-10-CM | POA: Diagnosis not present

## 2012-08-02 DIAGNOSIS — H2510 Age-related nuclear cataract, unspecified eye: Secondary | ICD-10-CM | POA: Diagnosis not present

## 2012-08-04 DIAGNOSIS — M810 Age-related osteoporosis without current pathological fracture: Secondary | ICD-10-CM | POA: Diagnosis not present

## 2012-08-15 DIAGNOSIS — H269 Unspecified cataract: Secondary | ICD-10-CM | POA: Diagnosis not present

## 2012-08-15 DIAGNOSIS — H2510 Age-related nuclear cataract, unspecified eye: Secondary | ICD-10-CM | POA: Diagnosis not present

## 2012-09-01 DIAGNOSIS — M069 Rheumatoid arthritis, unspecified: Secondary | ICD-10-CM | POA: Diagnosis not present

## 2012-09-29 DIAGNOSIS — M069 Rheumatoid arthritis, unspecified: Secondary | ICD-10-CM | POA: Diagnosis not present

## 2012-10-27 DIAGNOSIS — M069 Rheumatoid arthritis, unspecified: Secondary | ICD-10-CM | POA: Diagnosis not present

## 2012-11-24 DIAGNOSIS — M069 Rheumatoid arthritis, unspecified: Secondary | ICD-10-CM | POA: Diagnosis not present

## 2012-12-22 DIAGNOSIS — M069 Rheumatoid arthritis, unspecified: Secondary | ICD-10-CM | POA: Diagnosis not present

## 2013-01-11 DIAGNOSIS — M069 Rheumatoid arthritis, unspecified: Secondary | ICD-10-CM | POA: Diagnosis not present

## 2013-01-18 ENCOUNTER — Ambulatory Visit (HOSPITAL_BASED_OUTPATIENT_CLINIC_OR_DEPARTMENT_OTHER): Payer: Medicare Other | Admitting: Medical

## 2013-01-18 ENCOUNTER — Other Ambulatory Visit (HOSPITAL_BASED_OUTPATIENT_CLINIC_OR_DEPARTMENT_OTHER): Payer: Medicare Other | Admitting: Lab

## 2013-01-18 VITALS — BP 123/65 | HR 63 | Temp 98.2°F | Resp 16 | Ht 60.0 in | Wt 245.0 lb

## 2013-01-18 DIAGNOSIS — C8590 Non-Hodgkin lymphoma, unspecified, unspecified site: Secondary | ICD-10-CM

## 2013-01-18 DIAGNOSIS — C8589 Other specified types of non-Hodgkin lymphoma, extranodal and solid organ sites: Secondary | ICD-10-CM | POA: Diagnosis not present

## 2013-01-18 DIAGNOSIS — M069 Rheumatoid arthritis, unspecified: Secondary | ICD-10-CM

## 2013-01-18 DIAGNOSIS — D649 Anemia, unspecified: Secondary | ICD-10-CM | POA: Diagnosis not present

## 2013-01-18 DIAGNOSIS — C8580 Other specified types of non-Hodgkin lymphoma, unspecified site: Secondary | ICD-10-CM

## 2013-01-18 LAB — CBC WITH DIFFERENTIAL (CANCER CENTER ONLY)
BASO#: 0 10*3/uL (ref 0.0–0.2)
BASO%: 0.2 % (ref 0.0–2.0)
EOS%: 1.1 % (ref 0.0–7.0)
Eosinophils Absolute: 0.1 10*3/uL (ref 0.0–0.5)
HCT: 33.8 % — ABNORMAL LOW (ref 34.8–46.6)
HGB: 11.5 g/dL — ABNORMAL LOW (ref 11.6–15.9)
LYMPH#: 1.5 10*3/uL (ref 0.9–3.3)
LYMPH%: 22.7 % (ref 14.0–48.0)
MCH: 35.4 pg — ABNORMAL HIGH (ref 26.0–34.0)
MCHC: 34 g/dL (ref 32.0–36.0)
MCV: 104 fL — ABNORMAL HIGH (ref 81–101)
MONO#: 0.4 10*3/uL (ref 0.1–0.9)
MONO%: 5.9 % (ref 0.0–13.0)
NEUT#: 4.5 10*3/uL (ref 1.5–6.5)
NEUT%: 70.1 % (ref 39.6–80.0)
Platelets: 170 10*3/uL (ref 145–400)
RBC: 3.25 10*6/uL — ABNORMAL LOW (ref 3.70–5.32)
RDW: 12.9 % (ref 11.1–15.7)
WBC: 6.4 10*3/uL (ref 3.9–10.0)

## 2013-01-18 LAB — IRON AND TIBC CHCC
%SAT: 38 % (ref 21–57)
Iron: 101 ug/dL (ref 41–142)
TIBC: 266 ug/dL (ref 236–444)
UIBC: 165 ug/dL (ref 120–384)

## 2013-01-18 LAB — FERRITIN CHCC: Ferritin: 178 ng/ml (ref 9–269)

## 2013-01-18 LAB — CHCC SATELLITE - SMEAR

## 2013-01-18 NOTE — Progress Notes (Signed)
DIAGNOSES: 1. Diffuse small-cell non-Hodgkin lymphoma-clinical remission. 2. Rheumatoid arthritis. 3. Anemia of chronic disease.  CURRENT THERAPY:  Observation/therapy for rheumatoid arthritis.  INTERIM HISTORY:  history presents today for an office followup visit.  We continue to see her every 6 months.  Overall she reports that she's been doing relatively well her biggest issue is her rheumatoid arthritis.  She is on Orencia.  She otherwise does not report any new complaints.  She has a good appetite.  She denies any unintentional weight loss.  She denies any nausea, vomiting, diarrhea or constipation.  She denies any fevers, chills or night sweats.  She denies any chest pain, shortness of breath or cough.  She denies any abdominal pain.  She denies any obvious bleeding.  She denies any changes in her bowel or bladder habits.  She denies any odynophagia or dysphasia.  She denies any lower leg swelling.  She denies any headaches, visual changes or rashes   Review of Systems: Constitutional:Negative for malaise/fatigue, fever, chills, weight loss, diaphoresis, activity change, appetite change, and unexpected weight change.  HEENT: Negative for double vision, blurred vision, visual loss, ear pain, tinnitus, congestion, rhinorrhea, epistaxis sore throat or sinus disease, oral pain/lesion, tongue soreness Respiratory: Negative for cough, chest tightness, shortness of breath, wheezing and stridor.  Cardiovascular: Negative for chest pain, palpitations, leg swelling, orthopnea, PND, DOE or claudication Gastrointestinal: Negative for nausea, vomiting, abdominal pain, diarrhea, constipation, blood in stool, melena, hematochezia, abdominal distention, anal bleeding, rectal pain, anorexia and hematemesis.  Genitourinary: Negative for dysuria, frequency, hematuria,  Musculoskeletal: Negative for myalgias, back pain, joint swelling, arthralgias and gait problem.  Skin: Negative for rash, color change, pallor  and wound.  Neurological:. Negative for dizziness/light-headedness, tremors, seizures, syncope, facial asymmetry, speech difficulty, weakness, numbness, headaches and paresthesias.  Hematological: Negative for adenopathy. Does not bruise/bleed easily.  Psychiatric/Behavioral:  Negative for depression, no loss of interest in normal activity or change in sleep pattern.   Physical Exam: This is a pleasant 62 year old obese African American female in no obvious distress Vitals: temperature 98.2 degrees pulse 63 respirations 16 blood pressure 123/65 weight 245 pounds HEENT reveals a normocephalic, atraumatic skull, no scleral icterus, no oral lesions  Neck is supple without any cervical or supraclavicular adenopathy.  Lungs are clear to auscultation bilaterally. There are no wheezes, rales or rhonci Cardiac is regular rate and rhythm with a normal S1 and S2. There are no murmurs, rubs, or bruits.  Abdomen is soft with good bowel sounds, there is no palpable mass. There is no palpable hepatosplenomegaly. There is no palpable fluid wave.  Musculoskeletal no tenderness of the spine, ribs, or hips.  Extremities there are no clubbing, cyanosis, or edema.  Skin no petechia, purpura or ecchymosis Neurologic is nonfocal.  Laboratory Data: White count 6.4 hemoglobin 12.5 hematocrit 33.8 170,000  Current Outpatient Prescriptions on File Prior to Visit  Medication Sig Dispense Refill  . Abatacept (ORENCIA IV) Inject into the vein every 30 (thirty) days. Gets at [facility].      Marland Kitchen acetaminophen (TYLENOL) 650 MG CR tablet Take 650 mg by mouth daily.      . Cholecalciferol (VITAMIN D) 2000 UNITS CAPS Take 2,000 Units by mouth 2 (two) times daily.      . Cyanocobalamin (VITAMIN B 12 PO) Take by mouth every morning.      Marland Kitchen ESTROGENS CONJUGATED PO Take 1 mg by mouth daily.      . folic acid (FOLVITE) 1 MG tablet Take 1 mg by  mouth daily.      Marland Kitchen HYDROcodone-acetaminophen (VICODIN) 5-500 MG per  tablet Take 1 tablet by mouth every 6 (six) hours as needed.      . Multiple Vitamin (MULTIVITAMIN) tablet Take 1 tablet by mouth daily.      . pantoprazole (PROTONIX) 40 MG tablet Take 40 mg by mouth daily.      . predniSONE (STERAPRED UNI-PAK) 5 MG TABS Take 5 mg by mouth daily.      . valsartan-hydrochlorothiazide (DIOVAN-HCT) 160-25 MG per tablet Take 1 tablet by mouth daily. Takes 1/2 daily.      Marland Kitchen zolpidem (AMBIEN) 5 MG tablet Take 5 mg by mouth at bedtime as needed.       No current facility-administered medications on file prior to visit.   Assessment/Plan: This is a 62 year old African American female with the following issues:  #1.  History of diffuse small cell non-Hodgkin's lymphoma.  She was treated for this in the past.  She's had no evidence of reoccurrence now for 9 years.we will continue to monitor her every 6   #2.  Anemia.  This could be secondary to her medications well rheumatoid.  We are checking an iron panel on her.    #3.  Followup.  We will follow back up with Helen Hardy 6 months but before then should there be questions or concerns.

## 2013-01-19 DIAGNOSIS — M069 Rheumatoid arthritis, unspecified: Secondary | ICD-10-CM | POA: Diagnosis not present

## 2013-02-16 DIAGNOSIS — M069 Rheumatoid arthritis, unspecified: Secondary | ICD-10-CM | POA: Diagnosis not present

## 2013-03-13 DIAGNOSIS — Z01419 Encounter for gynecological examination (general) (routine) without abnormal findings: Secondary | ICD-10-CM | POA: Diagnosis not present

## 2013-03-17 DIAGNOSIS — M069 Rheumatoid arthritis, unspecified: Secondary | ICD-10-CM | POA: Diagnosis not present

## 2013-03-28 DIAGNOSIS — H18419 Arcus senilis, unspecified eye: Secondary | ICD-10-CM | POA: Diagnosis not present

## 2013-03-28 DIAGNOSIS — H524 Presbyopia: Secondary | ICD-10-CM | POA: Diagnosis not present

## 2013-03-28 DIAGNOSIS — H02839 Dermatochalasis of unspecified eye, unspecified eyelid: Secondary | ICD-10-CM | POA: Diagnosis not present

## 2013-03-28 DIAGNOSIS — Z961 Presence of intraocular lens: Secondary | ICD-10-CM | POA: Diagnosis not present

## 2013-04-10 ENCOUNTER — Other Ambulatory Visit: Payer: Self-pay

## 2013-04-10 DIAGNOSIS — Z1231 Encounter for screening mammogram for malignant neoplasm of breast: Secondary | ICD-10-CM

## 2013-04-13 DIAGNOSIS — M069 Rheumatoid arthritis, unspecified: Secondary | ICD-10-CM | POA: Diagnosis not present

## 2013-05-08 DIAGNOSIS — M659 Synovitis and tenosynovitis, unspecified: Secondary | ICD-10-CM | POA: Diagnosis not present

## 2013-05-08 DIAGNOSIS — M12279 Villonodular synovitis (pigmented), unspecified ankle and foot: Secondary | ICD-10-CM | POA: Diagnosis not present

## 2013-05-08 DIAGNOSIS — M79609 Pain in unspecified limb: Secondary | ICD-10-CM | POA: Diagnosis not present

## 2013-05-08 DIAGNOSIS — M65979 Unspecified synovitis and tenosynovitis, unspecified ankle and foot: Secondary | ICD-10-CM | POA: Diagnosis not present

## 2013-05-11 DIAGNOSIS — M069 Rheumatoid arthritis, unspecified: Secondary | ICD-10-CM | POA: Diagnosis not present

## 2013-05-15 DIAGNOSIS — M659 Synovitis and tenosynovitis, unspecified: Secondary | ICD-10-CM | POA: Diagnosis not present

## 2013-05-15 DIAGNOSIS — M65979 Unspecified synovitis and tenosynovitis, unspecified ankle and foot: Secondary | ICD-10-CM | POA: Diagnosis not present

## 2013-05-15 DIAGNOSIS — M069 Rheumatoid arthritis, unspecified: Secondary | ICD-10-CM | POA: Diagnosis not present

## 2013-05-15 DIAGNOSIS — M12279 Villonodular synovitis (pigmented), unspecified ankle and foot: Secondary | ICD-10-CM | POA: Diagnosis not present

## 2013-05-15 DIAGNOSIS — M722 Plantar fascial fibromatosis: Secondary | ICD-10-CM | POA: Diagnosis not present

## 2013-05-16 DIAGNOSIS — M216X9 Other acquired deformities of unspecified foot: Secondary | ICD-10-CM | POA: Diagnosis not present

## 2013-05-17 ENCOUNTER — Ambulatory Visit
Admission: RE | Admit: 2013-05-17 | Discharge: 2013-05-17 | Disposition: A | Discharge status: Home / Self Care | Payer: Medicare Other | Source: Ambulatory Visit

## 2013-05-17 DIAGNOSIS — Z1231 Encounter for screening mammogram for malignant neoplasm of breast: Secondary | ICD-10-CM | POA: Diagnosis not present

## 2013-05-29 DIAGNOSIS — M21619 Bunion of unspecified foot: Secondary | ICD-10-CM | POA: Diagnosis not present

## 2013-05-29 DIAGNOSIS — M25579 Pain in unspecified ankle and joints of unspecified foot: Secondary | ICD-10-CM | POA: Diagnosis not present

## 2013-05-29 DIAGNOSIS — M659 Synovitis and tenosynovitis, unspecified: Secondary | ICD-10-CM | POA: Diagnosis not present

## 2013-05-29 DIAGNOSIS — M65979 Unspecified synovitis and tenosynovitis, unspecified ankle and foot: Secondary | ICD-10-CM | POA: Diagnosis not present

## 2013-06-08 DIAGNOSIS — M069 Rheumatoid arthritis, unspecified: Secondary | ICD-10-CM | POA: Diagnosis not present

## 2013-06-26 DIAGNOSIS — M7660 Achilles tendinitis, unspecified leg: Secondary | ICD-10-CM | POA: Diagnosis not present

## 2013-06-28 DIAGNOSIS — D539 Nutritional anemia, unspecified: Secondary | ICD-10-CM | POA: Diagnosis not present

## 2013-06-28 DIAGNOSIS — Z0000 Encounter for general adult medical examination without abnormal findings: Secondary | ICD-10-CM | POA: Diagnosis not present

## 2013-06-28 DIAGNOSIS — K219 Gastro-esophageal reflux disease without esophagitis: Secondary | ICD-10-CM | POA: Diagnosis not present

## 2013-06-28 DIAGNOSIS — I10 Essential (primary) hypertension: Secondary | ICD-10-CM | POA: Diagnosis not present

## 2013-07-04 DIAGNOSIS — I10 Essential (primary) hypertension: Secondary | ICD-10-CM | POA: Diagnosis not present

## 2013-07-04 DIAGNOSIS — M069 Rheumatoid arthritis, unspecified: Secondary | ICD-10-CM | POA: Diagnosis not present

## 2013-07-04 DIAGNOSIS — H6120 Impacted cerumen, unspecified ear: Secondary | ICD-10-CM | POA: Diagnosis not present

## 2013-07-04 DIAGNOSIS — K219 Gastro-esophageal reflux disease without esophagitis: Secondary | ICD-10-CM | POA: Diagnosis not present

## 2013-07-04 DIAGNOSIS — M159 Polyosteoarthritis, unspecified: Secondary | ICD-10-CM | POA: Diagnosis not present

## 2013-07-07 DIAGNOSIS — M069 Rheumatoid arthritis, unspecified: Secondary | ICD-10-CM | POA: Diagnosis not present

## 2013-07-19 ENCOUNTER — Other Ambulatory Visit (HOSPITAL_BASED_OUTPATIENT_CLINIC_OR_DEPARTMENT_OTHER): Payer: Medicare Other | Admitting: Lab

## 2013-07-19 ENCOUNTER — Ambulatory Visit (HOSPITAL_BASED_OUTPATIENT_CLINIC_OR_DEPARTMENT_OTHER): Payer: Medicare Other | Admitting: Hematology & Oncology

## 2013-07-19 ENCOUNTER — Encounter: Payer: Self-pay | Admitting: Hematology & Oncology

## 2013-07-19 VITALS — BP 128/60 | HR 71 | Temp 98.2°F | Resp 14 | Ht 60.0 in | Wt 247.0 lb

## 2013-07-19 DIAGNOSIS — M790 Rheumatism, unspecified: Secondary | ICD-10-CM

## 2013-07-19 DIAGNOSIS — D638 Anemia in other chronic diseases classified elsewhere: Secondary | ICD-10-CM

## 2013-07-19 DIAGNOSIS — C8580 Other specified types of non-Hodgkin lymphoma, unspecified site: Secondary | ICD-10-CM

## 2013-07-19 DIAGNOSIS — C8589 Other specified types of non-Hodgkin lymphoma, extranodal and solid organ sites: Secondary | ICD-10-CM

## 2013-07-19 DIAGNOSIS — M069 Rheumatoid arthritis, unspecified: Secondary | ICD-10-CM | POA: Diagnosis not present

## 2013-07-19 DIAGNOSIS — C8590 Non-Hodgkin lymphoma, unspecified, unspecified site: Secondary | ICD-10-CM

## 2013-07-19 LAB — CBC WITH DIFFERENTIAL (CANCER CENTER ONLY)
BASO#: 0 10*3/uL (ref 0.0–0.2)
BASO%: 0.2 % (ref 0.0–2.0)
EOS%: 0.6 % (ref 0.0–7.0)
Eosinophils Absolute: 0 10*3/uL (ref 0.0–0.5)
HCT: 35.1 % (ref 34.8–46.6)
HGB: 11.7 g/dL (ref 11.6–15.9)
LYMPH#: 1.6 10*3/uL (ref 0.9–3.3)
LYMPH%: 24.9 % (ref 14.0–48.0)
MCH: 35.1 pg — ABNORMAL HIGH (ref 26.0–34.0)
MCHC: 33.3 g/dL (ref 32.0–36.0)
MCV: 105 fL — ABNORMAL HIGH (ref 81–101)
MONO#: 0.4 10*3/uL (ref 0.1–0.9)
MONO%: 6.7 % (ref 0.0–13.0)
NEUT#: 4.4 10*3/uL (ref 1.5–6.5)
NEUT%: 67.6 % (ref 39.6–80.0)
Platelets: 174 10*3/uL (ref 145–400)
RBC: 3.33 10*6/uL — ABNORMAL LOW (ref 3.70–5.32)
RDW: 14.2 % (ref 11.1–15.7)
WBC: 6.4 10*3/uL (ref 3.9–10.0)

## 2013-07-19 LAB — COMPREHENSIVE METABOLIC PANEL
ALT: 12 U/L (ref 0–35)
AST: 16 U/L (ref 0–37)
Albumin: 3.9 g/dL (ref 3.5–5.2)
Alkaline Phosphatase: 52 U/L (ref 39–117)
BUN: 18 mg/dL (ref 6–23)
CO2: 25 mEq/L (ref 19–32)
Calcium: 9.4 mg/dL (ref 8.4–10.5)
Chloride: 105 mEq/L (ref 96–112)
Creatinine, Ser: 0.74 mg/dL (ref 0.50–1.10)
Glucose, Bld: 97 mg/dL (ref 70–99)
Potassium: 4.1 mEq/L (ref 3.5–5.3)
Sodium: 141 mEq/L (ref 135–145)
Total Bilirubin: 0.3 mg/dL (ref 0.3–1.2)
Total Protein: 6.5 g/dL (ref 6.0–8.3)

## 2013-07-19 LAB — LACTATE DEHYDROGENASE: LDH: 207 U/L (ref 94–250)

## 2013-07-19 LAB — CHCC SATELLITE - SMEAR

## 2013-07-19 NOTE — Progress Notes (Signed)
This office note has been dictated.

## 2013-07-20 NOTE — Progress Notes (Signed)
CC:   Helen Hardy. Helen Hardy, M.D.  DIAGNOSES: 1. Diffuse small cell non-Hodgkin lymphoma-clinical remission. 2. Rheumatoid arthritis. 3. Anemia of chronic disease.  CURRENT THERAPY:  Observation.  INTERIM HISTORY:  Helen Hardy comes in for a followup.  We see her every 6 months.  She has been doing pretty well.  She had a nice Christmas.  She is doing pretty well with the rheumatoid arthritis.  She is on Orencia for this.  It seems to been helping her out, fairly well.  She still has some arthritic pain.  She has had no issues with respect to blood pressure.  Her blood sugars have not been all that bad.  She is on low-dose prednisone.  She did have a mammogram back in November.  Everything looked good on the mammogram without any suspicious calcifications.  She is due for a colonoscopy this year.  She has had no fevers, sweats, or chills.  She has had no palpable lymph glands.  She has had no cough.  PHYSICAL EXAMINATION:  General:  This is an obese [place] American female, in no obvious distress.  Vital Signs:  Temperature of 98.2, pulse 71, respiratory rate 14, blood pressure 128/60.  Weight is 247 pounds.  Head and Neck:  Normocephalic, atraumatic skull.  There are no ocular or oral lesions.  There are no palpable cervical or supraclavicular lymph nodes.  Lungs:  Clear bilaterally.  Cardiac: Regular rate and rhythm with normal S1, S2.  There are no murmurs, rubs, or bruits.  Abdomen:  Soft.  She has good bowel sounds.  There is no fluid wave.  There is no palpable hepatosplenomegaly.  Extremities: Show no clubbing, cyanosis, or edema.  Neurological:  Shows no focal neurological deficits.  Skin:  No rashes, ecchymoses, or petechia.  LABORATORY STUDIES:  White cell count is 6.4, hemoglobin 11.7, hematocrit 35.1, platelet count 174.  IMPRESSION:  Helen Hardy is a very nice 63 year old African American female with past history of diffuse small cell lymphoma.  She was  treated with systemic chemotherapy.  She tolerated this very well.  Again, I do not see any evidence of recurrent disease.  Her hemoglobin is doing fairly well.  She has anemia, secondary to rheumatoid arthritis.  We will go ahead and plan to get her back in 6 more months.    ______________________________ Helen Hardy, M.D. PRE/MEDQ  D:  07/19/2013  T:  07/20/2013  Job:  [id number]

## 2013-08-07 DIAGNOSIS — M65979 Unspecified synovitis and tenosynovitis, unspecified ankle and foot: Secondary | ICD-10-CM | POA: Diagnosis not present

## 2013-08-07 DIAGNOSIS — M069 Rheumatoid arthritis, unspecified: Secondary | ICD-10-CM | POA: Diagnosis not present

## 2013-08-07 DIAGNOSIS — M659 Synovitis and tenosynovitis, unspecified: Secondary | ICD-10-CM | POA: Diagnosis not present

## 2013-09-04 DIAGNOSIS — M069 Rheumatoid arthritis, unspecified: Secondary | ICD-10-CM | POA: Diagnosis not present

## 2013-09-11 DIAGNOSIS — M069 Rheumatoid arthritis, unspecified: Secondary | ICD-10-CM | POA: Diagnosis not present

## 2013-10-02 DIAGNOSIS — M069 Rheumatoid arthritis, unspecified: Secondary | ICD-10-CM | POA: Diagnosis not present

## 2013-10-31 DIAGNOSIS — M069 Rheumatoid arthritis, unspecified: Secondary | ICD-10-CM | POA: Diagnosis not present

## 2013-11-28 DIAGNOSIS — M069 Rheumatoid arthritis, unspecified: Secondary | ICD-10-CM | POA: Diagnosis not present

## 2013-11-29 DIAGNOSIS — Z96659 Presence of unspecified artificial knee joint: Secondary | ICD-10-CM | POA: Diagnosis not present

## 2013-11-29 DIAGNOSIS — M25569 Pain in unspecified knee: Secondary | ICD-10-CM | POA: Diagnosis not present

## 2013-11-30 ENCOUNTER — Other Ambulatory Visit (HOSPITAL_COMMUNITY): Payer: Self-pay | Admitting: Orthopedic Surgery

## 2013-11-30 DIAGNOSIS — M25561 Pain in right knee: Secondary | ICD-10-CM

## 2013-12-06 ENCOUNTER — Encounter (HOSPITAL_COMMUNITY)
Admission: RE | Admit: 2013-12-06 | Discharge: 2013-12-06 | Disposition: A | Discharge status: Home / Self Care | Payer: Medicare Other | Source: Ambulatory Visit | Attending: Orthopedic Surgery | Admitting: Orthopedic Surgery

## 2013-12-06 DIAGNOSIS — M25561 Pain in right knee: Secondary | ICD-10-CM

## 2013-12-06 DIAGNOSIS — IMO0002 Reserved for concepts with insufficient information to code with codable children: Secondary | ICD-10-CM | POA: Diagnosis not present

## 2013-12-06 DIAGNOSIS — M25569 Pain in unspecified knee: Secondary | ICD-10-CM | POA: Insufficient documentation

## 2013-12-06 DIAGNOSIS — M1710 Unilateral primary osteoarthritis, unspecified knee: Secondary | ICD-10-CM | POA: Diagnosis not present

## 2013-12-06 MED ORDER — TECHNETIUM TC 99M MEDRONATE IV KIT
25.0000 | PACK | Freq: Once | INTRAVENOUS | Status: AC | PRN
  Administered 2013-12-06: 25 via INTRAVENOUS

## 2013-12-14 ENCOUNTER — Telehealth: Payer: Self-pay | Admitting: Hematology & Oncology

## 2013-12-14 NOTE — Telephone Encounter (Signed)
Left message moved 7-15 to 7-17

## 2013-12-26 DIAGNOSIS — M069 Rheumatoid arthritis, unspecified: Secondary | ICD-10-CM | POA: Diagnosis not present

## 2013-12-29 DIAGNOSIS — M25569 Pain in unspecified knee: Secondary | ICD-10-CM | POA: Diagnosis not present

## 2014-01-11 DIAGNOSIS — M069 Rheumatoid arthritis, unspecified: Secondary | ICD-10-CM | POA: Diagnosis not present

## 2014-01-17 ENCOUNTER — Ambulatory Visit: Payer: Medicare Other | Admitting: Hematology & Oncology

## 2014-01-17 ENCOUNTER — Other Ambulatory Visit: Payer: Medicare Other | Admitting: Lab

## 2014-01-19 ENCOUNTER — Encounter: Payer: Self-pay | Admitting: Hematology & Oncology

## 2014-01-19 ENCOUNTER — Other Ambulatory Visit (HOSPITAL_BASED_OUTPATIENT_CLINIC_OR_DEPARTMENT_OTHER): Payer: Medicare Other | Admitting: Lab

## 2014-01-19 ENCOUNTER — Ambulatory Visit (HOSPITAL_BASED_OUTPATIENT_CLINIC_OR_DEPARTMENT_OTHER): Payer: Medicare Other | Admitting: Hematology & Oncology

## 2014-01-19 VITALS — BP 118/70 | HR 70 | Temp 97.8°F | Resp 14 | Ht 60.0 in | Wt 242.0 lb

## 2014-01-19 DIAGNOSIS — D638 Anemia in other chronic diseases classified elsewhere: Secondary | ICD-10-CM

## 2014-01-19 DIAGNOSIS — C8589 Other specified types of non-Hodgkin lymphoma, extranodal and solid organ sites: Secondary | ICD-10-CM

## 2014-01-19 DIAGNOSIS — M797 Fibromyalgia: Secondary | ICD-10-CM | POA: Diagnosis not present

## 2014-01-19 DIAGNOSIS — M790 Rheumatism, unspecified: Secondary | ICD-10-CM

## 2014-01-19 DIAGNOSIS — Z87898 Personal history of other specified conditions: Secondary | ICD-10-CM

## 2014-01-19 DIAGNOSIS — C8580 Other specified types of non-Hodgkin lymphoma, unspecified site: Secondary | ICD-10-CM

## 2014-01-19 DIAGNOSIS — C8590 Non-Hodgkin lymphoma, unspecified, unspecified site: Secondary | ICD-10-CM

## 2014-01-19 DIAGNOSIS — M069 Rheumatoid arthritis, unspecified: Secondary | ICD-10-CM

## 2014-01-19 DIAGNOSIS — D509 Iron deficiency anemia, unspecified: Secondary | ICD-10-CM

## 2014-01-19 LAB — CBC WITH DIFFERENTIAL (CANCER CENTER ONLY)
BASO#: 0 10*3/uL (ref 0.0–0.2)
BASO%: 0.2 % (ref 0.0–2.0)
EOS%: 0.7 % (ref 0.0–7.0)
Eosinophils Absolute: 0 10*3/uL (ref 0.0–0.5)
HCT: 34.2 % — ABNORMAL LOW (ref 34.8–46.6)
HGB: 11.7 g/dL (ref 11.6–15.9)
LYMPH#: 1.9 10*3/uL (ref 0.9–3.3)
LYMPH%: 31.8 % (ref 14.0–48.0)
MCH: 35.6 pg — ABNORMAL HIGH (ref 26.0–34.0)
MCHC: 34.2 g/dL (ref 32.0–36.0)
MCV: 104 fL — ABNORMAL HIGH (ref 81–101)
MONO#: 0.2 10*3/uL (ref 0.1–0.9)
MONO%: 4 % (ref 0.0–13.0)
NEUT#: 3.8 10*3/uL (ref 1.5–6.5)
NEUT%: 63.3 % (ref 39.6–80.0)
Platelets: 184 10*3/uL (ref 145–400)
RBC: 3.29 10*6/uL — ABNORMAL LOW (ref 3.70–5.32)
RDW: 13.7 % (ref 11.1–15.7)
WBC: 6 10*3/uL (ref 3.9–10.0)

## 2014-01-19 LAB — CHCC SATELLITE - SMEAR

## 2014-01-19 LAB — IRON AND TIBC CHCC
%SAT: 28 % (ref 21–57)
Iron: 78 ug/dL (ref 41–142)
TIBC: 284 ug/dL (ref 236–444)
UIBC: 205 ug/dL (ref 120–384)

## 2014-01-19 LAB — FERRITIN CHCC: Ferritin: 216 ng/ml (ref 9–269)

## 2014-01-21 NOTE — Progress Notes (Signed)
Hematology and Oncology Follow Up Visit  Helen Hardy [id number] 1950/12/18 63 y.o. 01/21/2014   Principle Diagnosis:  1. Diffuse small cell non-Hodgkin lymphoma-clinical remission. 2. Rheumatoid arthritis. 3. Anemia of chronic disease.  Current Therapy:    Observation     Interim History:  Helen Hardy is a followup. We saw her 6 months ago. She is doing okay. Her biggest problem is her rheumatoid arthritis. She sees her rheumatologist for this. She is on some medications for this. She still has flareups.  She's had no problems with cough. His been no bleeding. She's had no nausea vomiting. His been no change in bowel or bladder habits. She has not had any fever sweats or chills. She's not noticed any joint swelling. Her there's been no skin rashes.  Medications: Current outpatient prescriptions:Abatacept (ORENCIA IV), Inject into the vein every 30 (thirty) days. Gets at [facility]., Disp: , Rfl: ;  Acetaminophen (EQL ARTHRITIS PAIN RELIEF PO), Take 500 mg by mouth 2 (two) times daily., Disp: , Rfl: ;  Cholecalciferol (VITAMIN D) 2000 UNITS CAPS, Take 2,000 Units by mouth 2 (two) times daily., Disp: , Rfl: ;  estradiol (ESTRACE) 1 MG tablet, Take 1 mg by mouth daily. , Disp: , Rfl:  folic acid (FOLVITE) 1 MG tablet, Take 1 mg by mouth daily., Disp: , Rfl: ;  HYDROcodone-acetaminophen (NORCO/VICODIN) 5-325 MG per tablet, Take 1 tablet by mouth every 6 (six) hours as needed for pain., Disp: , Rfl: ;  leflunomide (ARAVA) 10 MG tablet, Take 10 mg by mouth daily. , Disp: , Rfl: ;  Multiple Vitamin (MULTIVITAMIN) tablet, Take 1 tablet by mouth daily., Disp: , Rfl:  pantoprazole (PROTONIX) 40 MG tablet, Take 40 mg by mouth daily., Disp: , Rfl: ;  predniSONE (STERAPRED UNI-PAK) 5 MG TABS, Take 5 mg by mouth daily., Disp: , Rfl: ;  valsartan-hydrochlorothiazide (DIOVAN-HCT) 160-25 MG per tablet, Take 1 tablet by mouth daily. Takes 1/2 daily., Disp: , Rfl:   Allergies:   Allergies  Allergen Reactions  . Oxycodone Nausea And Vomiting    Past Medical History, Surgical history, Social history, and Family History were reviewed and updated.  Review of Systems: As above  Physical Exam:  height is 5' (1.524 m) and weight is 242 lb (109.77 kg). Her oral temperature is 97.8 F (36.6 C). Her blood pressure is 118/70 and her pulse is 70. Her respiration is 14.   Obese African American female in no obvious distress. Head and neck exam shows no ocular or oral lesion. She is no palpable cervical or supraclavicular lymph nodes. Lungs are clear. Cardiac exam regular in rhythm with no murmurs rubs or bruits. Abdomen soft. She good bowel sounds. She is moderately obese. She has no fluid wave. There is no palpable liver or spleen tip. Neck exam shows no tenderness over the spine ribs or hips. Extremities shows some slight joint swelling in her hands who has good range of motion of her joints. Has good strength in her extremities. Neurological exam is nonfocal. Skin exam no rashes.  Lab Results  Component Value Date   WBC 6.0 01/19/2014   HGB 11.7 01/19/2014   HCT 34.2* 01/19/2014   MCV 104* 01/19/2014   PLT 184 01/19/2014     Chemistry      Component Value Date/Time   NA 140 01/19/2014 1118   K 4.1 01/19/2014 1118   CL 103 01/19/2014 1118   CO2 27 01/19/2014 1118   BUN 16 01/19/2014 1118  CREATININE 0.83 01/19/2014 1118      Component Value Date/Time   CALCIUM 9.6 01/19/2014 1118   ALKPHOS 58 01/19/2014 1118   AST 18 01/19/2014 1118   ALT 13 01/19/2014 1118   BILITOT 0.4 01/19/2014 1118      Ferritin is 216. Iron saturation is 28%. Total iron is 78.   Impression and Plan: [name] is 63 year old African [place] female. She is a past history of a low-grade non-Hodgkin's lymphoma. We've not had to treat her now for about 12 years. Again she is done incredibly well. I am just surprised that she has not had any recurrence but I certainly will be grateful for  this.  Her rheumatoid arthritis is her big problem right now.  We will get her back in another 6 months. I don't see any need for any type of x-ray studies that need to be done.   Volanda Napoleon, MD 7/19/20159:08 AM

## 2014-01-23 DIAGNOSIS — M069 Rheumatoid arthritis, unspecified: Secondary | ICD-10-CM | POA: Diagnosis not present

## 2014-01-23 LAB — COMPREHENSIVE METABOLIC PANEL
ALT: 13 U/L (ref 0–35)
AST: 18 U/L (ref 0–37)
Albumin: 4 g/dL (ref 3.5–5.2)
Alkaline Phosphatase: 58 U/L (ref 39–117)
BUN: 16 mg/dL (ref 6–23)
CO2: 27 mEq/L (ref 19–32)
Calcium: 9.6 mg/dL (ref 8.4–10.5)
Chloride: 103 mEq/L (ref 96–112)
Creatinine, Ser: 0.83 mg/dL (ref 0.50–1.10)
Glucose, Bld: 100 mg/dL — ABNORMAL HIGH (ref 70–99)
Potassium: 4.1 mEq/L (ref 3.5–5.3)
Sodium: 140 mEq/L (ref 135–145)
Total Bilirubin: 0.4 mg/dL (ref 0.2–1.2)
Total Protein: 6.6 g/dL (ref 6.0–8.3)

## 2014-01-23 LAB — SOLUBLE TRANSFERRIN RECEPTOR: Transferrin Receptor, Soluble: 2.22 mg/L — ABNORMAL HIGH (ref 0.76–1.76)

## 2014-01-23 LAB — LACTATE DEHYDROGENASE: LDH: 196 U/L (ref 94–250)

## 2014-02-20 DIAGNOSIS — M069 Rheumatoid arthritis, unspecified: Secondary | ICD-10-CM | POA: Diagnosis not present

## 2014-03-15 DIAGNOSIS — M25519 Pain in unspecified shoulder: Secondary | ICD-10-CM | POA: Diagnosis not present

## 2014-03-15 DIAGNOSIS — M19019 Primary osteoarthritis, unspecified shoulder: Secondary | ICD-10-CM | POA: Diagnosis not present

## 2014-03-15 DIAGNOSIS — M069 Rheumatoid arthritis, unspecified: Secondary | ICD-10-CM | POA: Diagnosis not present

## 2014-03-16 ENCOUNTER — Other Ambulatory Visit: Payer: Self-pay | Admitting: Rheumatology

## 2014-03-16 DIAGNOSIS — S46819A Strain of other muscles, fascia and tendons at shoulder and upper arm level, unspecified arm, initial encounter: Secondary | ICD-10-CM

## 2014-03-19 ENCOUNTER — Ambulatory Visit
Admission: RE | Admit: 2014-03-19 | Discharge: 2014-03-19 | Disposition: A | Discharge status: Home / Self Care | Payer: Medicare Other | Source: Ambulatory Visit | Attending: Rheumatology | Admitting: Rheumatology

## 2014-03-19 DIAGNOSIS — S46819A Strain of other muscles, fascia and tendons at shoulder and upper arm level, unspecified arm, initial encounter: Secondary | ICD-10-CM | POA: Diagnosis not present

## 2014-03-19 DIAGNOSIS — S46919A Strain of unspecified muscle, fascia and tendon at shoulder and upper arm level, unspecified arm, initial encounter: Secondary | ICD-10-CM | POA: Diagnosis not present

## 2014-03-20 DIAGNOSIS — M19019 Primary osteoarthritis, unspecified shoulder: Secondary | ICD-10-CM | POA: Diagnosis not present

## 2014-03-20 DIAGNOSIS — M75120 Complete rotator cuff tear or rupture of unspecified shoulder, not specified as traumatic: Secondary | ICD-10-CM | POA: Diagnosis not present

## 2014-03-27 DIAGNOSIS — M719 Bursopathy, unspecified: Secondary | ICD-10-CM | POA: Diagnosis not present

## 2014-03-27 DIAGNOSIS — M67919 Unspecified disorder of synovium and tendon, unspecified shoulder: Secondary | ICD-10-CM | POA: Diagnosis not present

## 2014-03-27 DIAGNOSIS — M19019 Primary osteoarthritis, unspecified shoulder: Secondary | ICD-10-CM | POA: Diagnosis not present

## 2014-03-27 DIAGNOSIS — M24119 Other articular cartilage disorders, unspecified shoulder: Secondary | ICD-10-CM | POA: Diagnosis not present

## 2014-03-27 DIAGNOSIS — M25819 Other specified joint disorders, unspecified shoulder: Secondary | ICD-10-CM | POA: Diagnosis not present

## 2014-03-27 DIAGNOSIS — G8918 Other acute postprocedural pain: Secondary | ICD-10-CM | POA: Diagnosis not present

## 2014-03-28 ENCOUNTER — Encounter (HOSPITAL_COMMUNITY): Payer: Self-pay | Admitting: Emergency Medicine

## 2014-03-28 ENCOUNTER — Emergency Department (HOSPITAL_COMMUNITY)
Admission: EM | Admit: 2014-03-28 | Discharge: 2014-03-28 | Disposition: A | Discharge status: Home / Self Care | Payer: Medicare Other | Attending: Emergency Medicine | Admitting: Emergency Medicine

## 2014-03-28 DIAGNOSIS — IMO0002 Reserved for concepts with insufficient information to code with codable children: Secondary | ICD-10-CM | POA: Insufficient documentation

## 2014-03-28 DIAGNOSIS — Z9889 Other specified postprocedural states: Secondary | ICD-10-CM | POA: Diagnosis not present

## 2014-03-28 DIAGNOSIS — M25519 Pain in unspecified shoulder: Secondary | ICD-10-CM | POA: Insufficient documentation

## 2014-03-28 DIAGNOSIS — Z8571 Personal history of Hodgkin lymphoma: Secondary | ICD-10-CM | POA: Insufficient documentation

## 2014-03-28 DIAGNOSIS — Z79899 Other long term (current) drug therapy: Secondary | ICD-10-CM | POA: Insufficient documentation

## 2014-03-28 DIAGNOSIS — G8918 Other acute postprocedural pain: Secondary | ICD-10-CM | POA: Insufficient documentation

## 2014-03-28 DIAGNOSIS — M069 Rheumatoid arthritis, unspecified: Secondary | ICD-10-CM | POA: Insufficient documentation

## 2014-03-28 DIAGNOSIS — M25512 Pain in left shoulder: Secondary | ICD-10-CM

## 2014-03-28 HISTORY — DX: Unspecified osteoarthritis, unspecified site: M19.90

## 2014-03-28 HISTORY — DX: Malignant (primary) neoplasm, unspecified: C80.1

## 2014-03-28 MED ORDER — HYDROMORPHONE HCL 1 MG/ML IJ SOLN
1.0000 mg | Freq: Once | INTRAMUSCULAR | Status: AC
  Administered 2014-03-28: 1 mg via INTRAMUSCULAR
  Filled 2014-03-28: qty 1

## 2014-03-28 MED ORDER — HYDROMORPHONE HCL 1 MG/ML IJ SOLN
1.0000 mg | Freq: Once | INTRAMUSCULAR | Status: AC
Start: 2014-03-28 — End: 2014-03-28
  Administered 2014-03-28: 1 mg via INTRAMUSCULAR
  Filled 2014-03-28: qty 1

## 2014-03-28 MED ORDER — DIAZEPAM 5 MG/ML IJ SOLN
3.7500 mg | Freq: Once | INTRAMUSCULAR | Status: AC
  Administered 2014-03-28: 3.75 mg via INTRAMUSCULAR
  Filled 2014-03-28: qty 2

## 2014-03-28 MED ORDER — ONDANSETRON 8 MG PO TBDP
8.0000 mg | ORAL_TABLET | Freq: Once | ORAL | Status: AC
  Administered 2014-03-28: 8 mg via ORAL
  Filled 2014-03-28: qty 1

## 2014-03-28 NOTE — ED Provider Notes (Signed)
CSN: [id number]     Arrival date & time 03/28/14  0030 History   First MD Initiated Contact with Patient 03/28/14 0050     Chief Complaint  Patient presents with  . Post-op Problem    (Consider location/radiation/quality/duration/timing/severity/associated sxs/prior Treatment) HPI Comments: Patient is a 63 year old female who presents to the emergency department today for left shoulder pain. Patient states that she had a rotator cuff repair performed in her left shoulder at noon yesterday by Dr. Onnie Graham. Patient states that she was pain free until a few hours prior to arrival. Pain is nonradiating and waxing and waning in severity. She states that she took one hydrocodone tablet for pain without improvement. Patient usually takes hydrocodone for relief of her rheumatoid arthritis. Patient denies any new trauma or injury to her shoulder. She states her shoulder has remained immobilized in a sling since the procedure. Patient denies loss of sensation in her LUE, pallor, and fever. She was prescribed Dilaudid, Valium, and tramadol for symptom management, but did not take any of his medications PTA.  The history is provided by the patient. No language interpreter was used.    Past Medical History  Diagnosis Date  . Arthritis     Rheumatoid  . Cancer     Hodkins Lymphoma   Past Surgical History  Procedure Laterality Date  . Rotator cuff repair     No family history on file. History  Substance Use Topics  . Smoking status: Never Smoker   . Smokeless tobacco: Never Used  . Alcohol Use: No   OB History   Grav Para Term Preterm Abortions TAB SAB Ect Mult Living                  Review of Systems  Constitutional: Negative for fever.  Musculoskeletal: Positive for arthralgias and myalgias.  Skin: Negative for color change.  Neurological: Negative for numbness.  All other systems reviewed and are negative.   Allergies  Oxycodone  Home Medications   Prior to Admission medications    Medication Sig Start Date End Date Taking? Authorizing Provider  Abatacept (ORENCIA IV) Inject into the vein every 30 (thirty) days. Gets at [facility].   Yes Historical Provider, MD  Acetaminophen (EQL ARTHRITIS PAIN RELIEF PO) Take 500 mg by mouth 2 (two) times daily.   Yes Historical Provider, MD  Cholecalciferol (VITAMIN D) 2000 UNITS CAPS Take 2,000 Units by mouth 2 (two) times daily.   Yes Historical Provider, MD  diazepam (VALIUM) 5 MG tablet Take 2.5-5 mg by mouth every 6 (six) hours as needed for muscle spasms.   Yes Historical Provider, MD  estradiol (ESTRACE) 1 MG tablet Take 1 mg by mouth daily.  12/25/12  Yes Historical Provider, MD  folic acid (FOLVITE) 1 MG tablet Take 1 mg by mouth daily.   Yes Historical Provider, MD  HYDROcodone-acetaminophen (NORCO/VICODIN) 5-325 MG per tablet Take 1 tablet by mouth every 6 (six) hours as needed for pain.   Yes Historical Provider, MD  HYDROmorphone (DILAUDID) 2 MG tablet Take 2-4 mg by mouth every 4 (four) hours as needed for severe pain.   Yes Historical Provider, MD  leflunomide (ARAVA) 10 MG tablet Take 10 mg by mouth daily.  01/13/14  Yes Historical Provider, MD  Multiple Vitamin (MULTIVITAMIN) tablet Take 1 tablet by mouth daily.   Yes Historical Provider, MD  pantoprazole (PROTONIX) 40 MG tablet Take 40 mg by mouth daily.   Yes Historical Provider, MD  predniSONE (STERAPRED UNI-PAK) 5  MG TABS Take 5 mg by mouth daily.   Yes Historical Provider, MD  traMADol (ULTRAM) 50 MG tablet Take 50-100 mg by mouth every 6 (six) hours as needed for moderate pain.   Yes Historical Provider, MD  valsartan-hydrochlorothiazide (DIOVAN-HCT) 160-25 MG per tablet Take 1 tablet by mouth daily. Takes 1/2 daily.   Yes Historical Provider, MD  vitamin B-12 (CYANOCOBALAMIN) 1000 MCG tablet Take 1,000 mcg by mouth daily.   Yes Historical Provider, MD   BP 137/98  Pulse 79  Temp(Src) 97.6 F (36.4 C) (Oral)  Resp 20  SpO2 100%  Physical Exam   Nursing note and vitals reviewed. Constitutional: She is oriented to person, place, and time. She appears well-developed and well-nourished. No distress.  Nontoxic/nonseptic appearing. Patient wailing in discomfort.  HENT:  Head: Normocephalic and atraumatic.  Eyes: Conjunctivae and EOM are normal. No scleral icterus.  Neck: Normal range of motion.  Cardiovascular: Normal rate, regular rhythm and intact distal pulses.   Distal radial pulse 2+ and left upper extremity. Capillary refill brisk in all digits of left hand.  Pulmonary/Chest: Effort normal. No respiratory distress. She has no wheezes.  Chest expansion symmetric. No tachypnea or dyspnea.  Musculoskeletal:  Left shoulder immobilized with sling. There is generalized tenderness appreciated. Dressing over surgical site is largely C/D/I; small amount of dry serosanguinous drainage appreciated to lateral aspect of dressing. No active weeping or drainage appreciated.  Neurological: She is alert and oriented to person, place, and time. She exhibits normal muscle tone. Coordination normal.  Sensation to light touch intact in distal left upper extremity. Patient able to wiggle all fingers of left hand.  Skin: Skin is warm and dry. No rash noted. She is not diaphoretic. No erythema. No pallor.  Psychiatric: She has a normal mood and affect. Her behavior is normal.    ED Course  Procedures (including critical care time) Labs Review Labs Reviewed - No data to display  Imaging Review No results found.   EKG Interpretation None      MDM   Final diagnoses:  Left shoulder pain    63 year old female presents to the emergency department for worsening left shoulder pain. Patient endorses having her left rotator cuff repaired by Dr. Onnie Graham yesterday afternoon. No new trauma or injury to shoulder. Patient neurovascularly intact on exam. Shoulder immobilized with sling. Dressing over surgical site is C/D/I. patient treated in ED with Valium  and Dilaudid. Patient had significant improvement in her symptoms with this regimen.   I have counseled the patient on outpatient pain management as she did not take any of the medications prescribed to her by Dr. Onnie Graham PTA. Given the pain is now well-controlled, believe patient is stable for outpatient management. Have advised orthopedic followup as needed and provided return precautions. Patient agreeable to plan with no unaddressed concerns. Patient discharged in good condition; VSS.   Filed Vitals:   03/28/14 0042 03/28/14 0209  BP: 137/98 149/91  Pulse: 79 77  Temp: 97.6 F (36.4 C)   TempSrc: Oral   Resp: 20 18  SpO2: 100% 97%     Antonietta Breach, PA-C 03/28/14 [phone]

## 2014-03-28 NOTE — ED Provider Notes (Signed)
Medical screening examination/treatment/procedure(s) were performed by non-physician practitioner and as supervising physician I was immediately available for consultation/collaboration.   EKG Interpretation None        Merryl Hacker, MD 03/28/14 [phone]

## 2014-03-28 NOTE — Discharge Instructions (Signed)
Keep your shoulder in a sling for immobilization. Recommend that you apply heat packs to your shoulder for muscle spasms every few hours. For severe pain: Take Dilaudid (Hydromorphone) as prescribed as well as Valium (Diazepam) For moderate pain: Take either Tramadol (Ultram) or your usual Hydrocodone tablets as prescribed as well as Valium (Diazepam) Follow up with your surgeon at your scheduled appointment.  Rotator Cuff Injury Rotator cuff injury is any type of injury to the set of muscles and tendons that make up the stabilizing unit of your shoulder. This unit holds the ball of your upper arm bone (humerus) in the socket of your shoulder blade (scapula).  CAUSES Injuries to your rotator cuff most commonly come from sports or activities that cause your arm to be moved repeatedly over your head. Examples of this include throwing, weight lifting, swimming, or racquet sports. Long lasting (chronic) irritation of your rotator cuff can cause soreness and swelling (inflammation), bursitis, and eventual damage to your tendons, such as a tear (rupture). SIGNS AND SYMPTOMS Acute rotator cuff tear:  Sudden tearing sensation followed by severe pain shooting from your upper shoulder down your arm toward your elbow.  Decreased range of motion of your shoulder because of pain and muscle spasm.  Severe pain.  Inability to raise your arm out to the side because of pain and loss of muscle power (large tears). Chronic rotator cuff tear:  Pain that usually is worse at night and may interfere with sleep.  Gradual weakness and decreased shoulder motion as the pain worsens.  Decreased range of motion. Rotator cuff tendinitis:  Deep ache in your shoulder and the outside upper arm over your shoulder.  Pain that comes on gradually and becomes worse when lifting your arm to the side or turning it inward. DIAGNOSIS Rotator cuff injury is diagnosed through a medical history, physical exam, and imaging exam.  The medical history helps determine the type of rotator cuff injury. Your health care provider will look at your injured shoulder, feel the injured area, and ask you to move your shoulder in different positions. X-ray exams typically are done to rule out other causes of shoulder pain, such as fractures. MRI is the exam of choice for the most severe shoulder injuries because the images show muscles and tendons.  TREATMENT  Chronic tear:  Medicine for pain, such as acetaminophen or ibuprofen.  Physical therapy and range-of-motion exercises may be helpful in maintaining shoulder function and strength.  Steroid injections into your shoulder joint.  Surgical repair of the rotator cuff if the injury does not heal with noninvasive treatment. Acute tear:  Anti-inflammatory medicines such as ibuprofen and naproxen to help reduce pain and swelling.  A sling to help support your arm and rest your rotator cuff muscles. Long-term use of a sling is not advised. It may cause significant stiffening of the shoulder joint.  Surgery may be considered within a few weeks, especially in younger, active people, to return the shoulder to full function.  Indications for surgical treatment include the following:  Age younger than 63 years.  Rotator cuff tears that are complete.  Physical therapy, rest, and anti-inflammatory medicines have been used for 6-8 weeks, with no improvement.  Employment or sporting activity that requires constant shoulder use. Tendinitis:  Anti-inflammatory medicines such as ibuprofen and naproxen to help reduce pain and swelling.  A sling to help support your arm and rest your rotator cuff muscles. Long-term use of a sling is not advised. It may cause significant  stiffening of the shoulder joint.  Severe tendinitis may require:  Steroid injections into your shoulder joint.  Physical therapy.  Surgery. HOME CARE INSTRUCTIONS   Apply ice to your injury:  Put ice in a  plastic bag.  Place a towel between your skin and the bag.  Leave the ice on for 20 minutes, 2-3 times a day.  If you have a shoulder immobilizer (sling and straps), wear it until told otherwise by your health care provider.  You may want to sleep on several pillows or in a recliner at night to lessen swelling and pain.  Only take over-the-counter or prescription medicines for pain, discomfort, or fever as directed by your health care provider.  Do simple hand squeezing exercises with a soft rubber ball to decrease hand swelling. SEEK MEDICAL CARE IF:   Your shoulder pain increases, or new pain or numbness develops in your arm, hand, or fingers.  Your hand or fingers are colder than your other hand. SEEK IMMEDIATE MEDICAL CARE IF:   Your arm, hand, or fingers are numb or tingling.  Your arm, hand, or fingers are increasingly swollen and painful, or they turn white or blue. MAKE SURE YOU:  Understand these instructions.  Will watch your condition.  Will get help right away if you are not doing well or get worse. Document Released: 06/19/2000 Document Revised: 06/27/2013 Document Reviewed: 02/01/2013 [facility] Patient Information 2015 [place], [place]. This information is not intended to replace advice given to you by your health care provider. Make sure you discuss any questions you have with your health care provider.

## 2014-03-28 NOTE — ED Notes (Signed)
Pt states that she has rotator cuff repair to left shoulder this am; pt reports that her pain is not controlled; pt with prescriptions for Dialauduid, Valium, and Tramadol but has not taken any of the medications; pt states that she has Hydrocodone and took one of those (pt take Hydrocodone routinely for RA)

## 2014-04-04 DIAGNOSIS — M25519 Pain in unspecified shoulder: Secondary | ICD-10-CM | POA: Diagnosis not present

## 2014-04-04 DIAGNOSIS — Z4789 Encounter for other orthopedic aftercare: Secondary | ICD-10-CM | POA: Diagnosis not present

## 2014-04-06 DIAGNOSIS — M25512 Pain in left shoulder: Secondary | ICD-10-CM | POA: Diagnosis not present

## 2014-04-10 DIAGNOSIS — M0579 Rheumatoid arthritis with rheumatoid factor of multiple sites without organ or systems involvement: Secondary | ICD-10-CM | POA: Diagnosis not present

## 2014-04-10 DIAGNOSIS — M0589 Other rheumatoid arthritis with rheumatoid factor of multiple sites: Secondary | ICD-10-CM | POA: Diagnosis not present

## 2014-04-10 DIAGNOSIS — M25512 Pain in left shoulder: Secondary | ICD-10-CM | POA: Diagnosis not present

## 2014-04-13 DIAGNOSIS — M25512 Pain in left shoulder: Secondary | ICD-10-CM | POA: Diagnosis not present

## 2014-04-16 DIAGNOSIS — M25512 Pain in left shoulder: Secondary | ICD-10-CM | POA: Diagnosis not present

## 2014-04-17 ENCOUNTER — Other Ambulatory Visit: Payer: Self-pay

## 2014-04-17 DIAGNOSIS — Z1239 Encounter for other screening for malignant neoplasm of breast: Secondary | ICD-10-CM

## 2014-04-19 DIAGNOSIS — M25512 Pain in left shoulder: Secondary | ICD-10-CM | POA: Diagnosis not present

## 2014-04-23 DIAGNOSIS — M25512 Pain in left shoulder: Secondary | ICD-10-CM | POA: Diagnosis not present

## 2014-04-26 DIAGNOSIS — M25512 Pain in left shoulder: Secondary | ICD-10-CM | POA: Diagnosis not present

## 2014-04-30 DIAGNOSIS — M25512 Pain in left shoulder: Secondary | ICD-10-CM | POA: Diagnosis not present

## 2014-05-02 DIAGNOSIS — M25512 Pain in left shoulder: Secondary | ICD-10-CM | POA: Diagnosis not present

## 2014-05-07 DIAGNOSIS — M25512 Pain in left shoulder: Secondary | ICD-10-CM | POA: Diagnosis not present

## 2014-05-08 DIAGNOSIS — Z23 Encounter for immunization: Secondary | ICD-10-CM | POA: Diagnosis not present

## 2014-05-08 DIAGNOSIS — M0589 Other rheumatoid arthritis with rheumatoid factor of multiple sites: Secondary | ICD-10-CM | POA: Diagnosis not present

## 2014-05-09 DIAGNOSIS — M25512 Pain in left shoulder: Secondary | ICD-10-CM | POA: Diagnosis not present

## 2014-05-10 DIAGNOSIS — M0579 Rheumatoid arthritis with rheumatoid factor of multiple sites without organ or systems involvement: Secondary | ICD-10-CM | POA: Diagnosis not present

## 2014-05-14 DIAGNOSIS — M25512 Pain in left shoulder: Secondary | ICD-10-CM | POA: Diagnosis not present

## 2014-05-16 DIAGNOSIS — M25512 Pain in left shoulder: Secondary | ICD-10-CM | POA: Diagnosis not present

## 2014-05-21 DIAGNOSIS — M25512 Pain in left shoulder: Secondary | ICD-10-CM | POA: Diagnosis not present

## 2014-05-23 DIAGNOSIS — M25512 Pain in left shoulder: Secondary | ICD-10-CM | POA: Diagnosis not present

## 2014-05-28 DIAGNOSIS — M25512 Pain in left shoulder: Secondary | ICD-10-CM | POA: Diagnosis not present

## 2014-05-30 DIAGNOSIS — M25512 Pain in left shoulder: Secondary | ICD-10-CM | POA: Diagnosis not present

## 2014-06-05 DIAGNOSIS — M0589 Other rheumatoid arthritis with rheumatoid factor of multiple sites: Secondary | ICD-10-CM | POA: Diagnosis not present

## 2014-06-06 DIAGNOSIS — M25512 Pain in left shoulder: Secondary | ICD-10-CM | POA: Diagnosis not present

## 2014-06-08 DIAGNOSIS — H18413 Arcus senilis, bilateral: Secondary | ICD-10-CM | POA: Diagnosis not present

## 2014-06-08 DIAGNOSIS — H02839 Dermatochalasis of unspecified eye, unspecified eyelid: Secondary | ICD-10-CM | POA: Diagnosis not present

## 2014-06-08 DIAGNOSIS — Z961 Presence of intraocular lens: Secondary | ICD-10-CM | POA: Diagnosis not present

## 2014-06-08 DIAGNOSIS — M25512 Pain in left shoulder: Secondary | ICD-10-CM | POA: Diagnosis not present

## 2014-06-08 DIAGNOSIS — I10 Essential (primary) hypertension: Secondary | ICD-10-CM | POA: Diagnosis not present

## 2014-06-11 DIAGNOSIS — M25512 Pain in left shoulder: Secondary | ICD-10-CM | POA: Diagnosis not present

## 2014-06-13 DIAGNOSIS — M25512 Pain in left shoulder: Secondary | ICD-10-CM | POA: Diagnosis not present

## 2014-06-18 DIAGNOSIS — M25512 Pain in left shoulder: Secondary | ICD-10-CM | POA: Diagnosis not present

## 2014-06-20 DIAGNOSIS — M25512 Pain in left shoulder: Secondary | ICD-10-CM | POA: Diagnosis not present

## 2014-06-22 DIAGNOSIS — M25512 Pain in left shoulder: Secondary | ICD-10-CM | POA: Diagnosis not present

## 2014-06-25 ENCOUNTER — Ambulatory Visit
Admission: RE | Admit: 2014-06-25 | Discharge: 2014-06-25 | Disposition: A | Discharge status: Home / Self Care | Payer: Medicare Other | Source: Ambulatory Visit

## 2014-06-25 DIAGNOSIS — M25512 Pain in left shoulder: Secondary | ICD-10-CM | POA: Diagnosis not present

## 2014-06-25 DIAGNOSIS — Z1239 Encounter for other screening for malignant neoplasm of breast: Secondary | ICD-10-CM

## 2014-06-25 DIAGNOSIS — Z1231 Encounter for screening mammogram for malignant neoplasm of breast: Secondary | ICD-10-CM | POA: Diagnosis not present

## 2014-06-27 DIAGNOSIS — M25512 Pain in left shoulder: Secondary | ICD-10-CM | POA: Diagnosis not present

## 2014-07-02 DIAGNOSIS — M25512 Pain in left shoulder: Secondary | ICD-10-CM | POA: Diagnosis not present

## 2014-07-03 DIAGNOSIS — M0589 Other rheumatoid arthritis with rheumatoid factor of multiple sites: Secondary | ICD-10-CM | POA: Diagnosis not present

## 2014-07-04 DIAGNOSIS — M25512 Pain in left shoulder: Secondary | ICD-10-CM | POA: Diagnosis not present

## 2014-07-10 DIAGNOSIS — M25512 Pain in left shoulder: Secondary | ICD-10-CM | POA: Diagnosis not present

## 2014-07-17 DIAGNOSIS — M25512 Pain in left shoulder: Secondary | ICD-10-CM | POA: Diagnosis not present

## 2014-07-18 DIAGNOSIS — I10 Essential (primary) hypertension: Secondary | ICD-10-CM | POA: Diagnosis not present

## 2014-07-18 DIAGNOSIS — E559 Vitamin D deficiency, unspecified: Secondary | ICD-10-CM | POA: Diagnosis not present

## 2014-07-18 DIAGNOSIS — Z0000 Encounter for general adult medical examination without abnormal findings: Secondary | ICD-10-CM | POA: Diagnosis not present

## 2014-07-18 DIAGNOSIS — K219 Gastro-esophageal reflux disease without esophagitis: Secondary | ICD-10-CM | POA: Diagnosis not present

## 2014-07-18 DIAGNOSIS — Z23 Encounter for immunization: Secondary | ICD-10-CM | POA: Diagnosis not present

## 2014-07-20 ENCOUNTER — Other Ambulatory Visit (HOSPITAL_BASED_OUTPATIENT_CLINIC_OR_DEPARTMENT_OTHER): Payer: Medicare Other | Admitting: Lab

## 2014-07-20 ENCOUNTER — Encounter: Payer: Self-pay | Admitting: Hematology & Oncology

## 2014-07-20 ENCOUNTER — Ambulatory Visit (HOSPITAL_BASED_OUTPATIENT_CLINIC_OR_DEPARTMENT_OTHER): Payer: Medicare Other | Admitting: Hematology & Oncology

## 2014-07-20 VITALS — BP 139/76 | HR 76 | Temp 97.7°F | Resp 14 | Ht 60.0 in | Wt 241.0 lb

## 2014-07-20 DIAGNOSIS — D509 Iron deficiency anemia, unspecified: Secondary | ICD-10-CM

## 2014-07-20 DIAGNOSIS — C8590 Non-Hodgkin lymphoma, unspecified, unspecified site: Secondary | ICD-10-CM

## 2014-07-20 DIAGNOSIS — D649 Anemia, unspecified: Secondary | ICD-10-CM | POA: Diagnosis not present

## 2014-07-20 DIAGNOSIS — Z8572 Personal history of non-Hodgkin lymphomas: Secondary | ICD-10-CM

## 2014-07-20 DIAGNOSIS — M069 Rheumatoid arthritis, unspecified: Secondary | ICD-10-CM | POA: Diagnosis not present

## 2014-07-20 LAB — COMPREHENSIVE METABOLIC PANEL (CC13)
ALT: 12 U/L (ref 0–55)
AST: 17 U/L (ref 5–34)
Albumin: 3.5 g/dL (ref 3.5–5.0)
Alkaline Phosphatase: 61 U/L (ref 40–150)
Anion Gap: 10 mEq/L (ref 3–11)
BUN: 14.9 mg/dL (ref 7.0–26.0)
CO2: 24 mEq/L (ref 22–29)
Calcium: 9.1 mg/dL (ref 8.4–10.4)
Chloride: 108 mEq/L (ref 98–109)
Creatinine: 0.7 mg/dL (ref 0.6–1.1)
EGFR: >90 mL/min/{1.73_m2} (ref >90)
Glucose: 96 mg/dl (ref 70–140)
Potassium: 3.9 mEq/L (ref 3.5–5.1)
Sodium: 142 mEq/L (ref 136–145)
Total Bilirubin: 0.46 mg/dL (ref 0.20–1.20)
Total Protein: 6.2 g/dL — ABNORMAL LOW (ref 6.4–8.3)

## 2014-07-20 LAB — CBC WITH DIFFERENTIAL (CANCER CENTER ONLY)
BASO#: 0 10*3/uL (ref 0.0–0.2)
BASO%: 0.2 % (ref 0.0–2.0)
EOS%: 1.6 % (ref 0.0–7.0)
Eosinophils Absolute: 0.1 10*3/uL (ref 0.0–0.5)
HCT: 30.7 % — ABNORMAL LOW (ref 34.8–46.6)
HGB: 10.2 g/dL — ABNORMAL LOW (ref 11.6–15.9)
LYMPH#: 1.6 10*3/uL (ref 0.9–3.3)
LYMPH%: 32.9 % (ref 14.0–48.0)
MCH: 35.2 pg — ABNORMAL HIGH (ref 26.0–34.0)
MCHC: 33.2 g/dL (ref 32.0–36.0)
MCV: 106 fL — ABNORMAL HIGH (ref 81–101)
MONO#: 0.4 10*3/uL (ref 0.1–0.9)
MONO%: 8.2 % (ref 0.0–13.0)
NEUT#: 2.8 10*3/uL (ref 1.5–6.5)
NEUT%: 57.1 % (ref 39.6–80.0)
Platelets: 158 10*3/uL (ref 145–400)
RBC: 2.9 10*6/uL — ABNORMAL LOW (ref 3.70–5.32)
RDW: 14.3 % (ref 11.1–15.7)
WBC: 5 10*3/uL (ref 3.9–10.0)

## 2014-07-20 LAB — LACTATE DEHYDROGENASE: LDH: 192 U/L (ref 94–250)

## 2014-07-20 LAB — CHCC SATELLITE - SMEAR

## 2014-07-20 LAB — FERRITIN CHCC: Ferritin: 256 ng/ml (ref 9–269)

## 2014-07-20 LAB — IRON AND TIBC CHCC
%SAT: 41 % (ref 21–57)
Iron: 104 ug/dL (ref 41–142)
TIBC: 250 ug/dL (ref 236–444)
UIBC: 146 ug/dL (ref 120–384)

## 2014-07-20 NOTE — Progress Notes (Signed)
Hematology and Oncology Follow Up Visit  Helen Hardy [id number] 12/20/1950 64 y.o. 07/20/2014   Principle Diagnosis:  1. Diffuse small cell non-Hodgkin lymphoma-clinical remission. 2. Rheumatoid arthritis. 3. Anemia of chronic disease.  Current Therapy:    Observation     Interim History:  Ms.  Helen Hardy is a followup. We saw her 6 months ago. Unfortunately, in September, she fell and tore her left shoulder. She needed surgery for this. She had arthroscopic surgery. She's been doing physical therapy. She still has limited range of motion.  Otherwise, she's been doing okay. She has bad rheumatoid arthritis. She has issues with a lot of her joints.  She is on Orencia for the rheumatoid arthritis. She gets this in her rheumatologist's office.  She's had no bleeding. She's had no change in bowel or bladder habits. She's had no cough. She will was able to enjoy the Christmas and Thanksgiving holiday.   Medications:  Current outpatient prescriptions:  .  Abatacept (ORENCIA IV), Inject into the vein every 30 (thirty) days. Gets at [facility]., Disp: , Rfl:  .  Acetaminophen (EQL ARTHRITIS PAIN RELIEF PO), Take 500 mg by mouth 2 (two) times daily., Disp: , Rfl:  .  Cholecalciferol (VITAMIN D) 2000 UNITS CAPS, Take 2,000 Units by mouth 2 (two) times daily., Disp: , Rfl:  .  diazepam (VALIUM) 5 MG tablet, Take 2.5-5 mg by mouth every 6 (six) hours as needed for muscle spasms., Disp: , Rfl:  .  estradiol (ESTRACE) 1 MG tablet, Take 1 mg by mouth daily. , Disp: , Rfl:  .  folic acid (FOLVITE) 1 MG tablet, Take 1 mg by mouth daily., Disp: , Rfl:  .  HYDROcodone-acetaminophen (NORCO/VICODIN) 5-325 MG per tablet, Take 1 tablet by mouth every 6 (six) hours as needed for pain., Disp: , Rfl:  .  leflunomide (ARAVA) 10 MG tablet, Take 10 mg by mouth daily. , Disp: , Rfl:  .  Multiple Vitamin (MULTIVITAMIN) tablet, Take 1 tablet by mouth daily., Disp: , Rfl:  .  pantoprazole  (PROTONIX) 40 MG tablet, Take 40 mg by mouth daily., Disp: , Rfl:  .  predniSONE (STERAPRED UNI-PAK) 5 MG TABS, Take 5 mg by mouth daily., Disp: , Rfl:  .  valsartan-hydrochlorothiazide (DIOVAN-HCT) 160-25 MG per tablet, Take 1 tablet by mouth daily. Takes 1/2 daily., Disp: , Rfl:  .  vitamin B-12 (CYANOCOBALAMIN) 1000 MCG tablet, Take 1,000 mcg by mouth daily., Disp: , Rfl:   Allergies:  Allergies  Allergen Reactions  . Oxycodone Nausea And Vomiting    Past Medical History, Surgical history, Social history, and Family History were reviewed and updated.  Review of Systems: As above  Physical Exam:  height is 5' (1.524 m) and weight is 241 lb (109.317 kg). Her oral temperature is 97.7 F (36.5 C). Her blood pressure is 139/76 and her pulse is 76. Her respiration is 14.   Obese African American female in no obvious distress. Head and neck exam shows no ocular or oral lesion. She is no palpable cervical or supraclavicular lymph nodes. Lungs are clear. Cardiac exam regular rate and rhythm with no murmurs rubs or bruits. Abdomen soft. She good bowel sounds. She is moderately obese. She has no fluid wave. There is no palpable liver or spleen tip. Neck exam shows no tenderness over the spine ribs or hips. Extremities shows some slight joint swelling in her hands who has good range of motion of her joints. Has good strength in her  extremities. Neurological exam is nonfocal. Skin exam no rashes.  Lab Results  Component Value Date   WBC 5.0 07/20/2014   HGB 10.2* 07/20/2014   HCT 30.7* 07/20/2014   MCV 106* 07/20/2014   PLT 158 07/20/2014     Chemistry      Component Value Date/Time   NA 140 01/19/2014 1118   K 4.1 01/19/2014 1118   CL 103 01/19/2014 1118   CO2 27 01/19/2014 1118   BUN 16 01/19/2014 1118   CREATININE 0.83 01/19/2014 1118      Component Value Date/Time   CALCIUM 9.6 01/19/2014 1118   ALKPHOS 58 01/19/2014 1118   AST 18 01/19/2014 1118   ALT 13 01/19/2014 1118    BILITOT 0.4 01/19/2014 1118         Impression and Plan: [name] is 64 year old African [place] female. She is a past history of a low-grade non-Hodgkin's lymphoma. We've not had to treat her now for about 13 years. Again she is done incredibly well. I am just surprised that she has not had any recurrence but I certainly will be grateful for this.  She is certainly more anemic today. I looked at her blood smear. I do not see anything that looked suspicious. I would have to think that the anemia is one of chronic disease. I don't see anything that would suggest hemolysis. I don't believe there is anything that would indicate iron deficiency.  I do want to get her back in about 4 months. We will see what her blood count looks at that point.  Hopefully, she will have better range of motion of the left shoulder we see her back.    Volanda Napoleon, MD 1/15/20161:23 PM

## 2014-07-24 DIAGNOSIS — R61 Generalized hyperhidrosis: Secondary | ICD-10-CM | POA: Diagnosis not present

## 2014-07-24 DIAGNOSIS — R7309 Other abnormal glucose: Secondary | ICD-10-CM | POA: Diagnosis not present

## 2014-07-24 DIAGNOSIS — K219 Gastro-esophageal reflux disease without esophagitis: Secondary | ICD-10-CM | POA: Diagnosis not present

## 2014-07-24 DIAGNOSIS — I10 Essential (primary) hypertension: Secondary | ICD-10-CM | POA: Diagnosis not present

## 2014-07-25 DIAGNOSIS — M25512 Pain in left shoulder: Secondary | ICD-10-CM | POA: Diagnosis not present

## 2014-07-31 DIAGNOSIS — M0589 Other rheumatoid arthritis with rheumatoid factor of multiple sites: Secondary | ICD-10-CM | POA: Diagnosis not present

## 2014-08-01 DIAGNOSIS — M25512 Pain in left shoulder: Secondary | ICD-10-CM | POA: Diagnosis not present

## 2014-08-08 DIAGNOSIS — M25512 Pain in left shoulder: Secondary | ICD-10-CM | POA: Diagnosis not present

## 2014-08-21 DIAGNOSIS — M810 Age-related osteoporosis without current pathological fracture: Secondary | ICD-10-CM | POA: Diagnosis not present

## 2014-08-28 DIAGNOSIS — M0589 Other rheumatoid arthritis with rheumatoid factor of multiple sites: Secondary | ICD-10-CM | POA: Diagnosis not present

## 2014-09-06 DIAGNOSIS — M0579 Rheumatoid arthritis with rheumatoid factor of multiple sites without organ or systems involvement: Secondary | ICD-10-CM | POA: Diagnosis not present

## 2014-09-25 DIAGNOSIS — M0589 Other rheumatoid arthritis with rheumatoid factor of multiple sites: Secondary | ICD-10-CM | POA: Diagnosis not present

## 2014-10-23 DIAGNOSIS — M0579 Rheumatoid arthritis with rheumatoid factor of multiple sites without organ or systems involvement: Secondary | ICD-10-CM | POA: Diagnosis not present

## 2014-10-23 DIAGNOSIS — M0589 Other rheumatoid arthritis with rheumatoid factor of multiple sites: Secondary | ICD-10-CM | POA: Diagnosis not present

## 2014-10-29 DIAGNOSIS — I10 Essential (primary) hypertension: Secondary | ICD-10-CM | POA: Diagnosis not present

## 2014-10-29 DIAGNOSIS — K219 Gastro-esophageal reflux disease without esophagitis: Secondary | ICD-10-CM | POA: Diagnosis not present

## 2014-10-29 DIAGNOSIS — Z1211 Encounter for screening for malignant neoplasm of colon: Secondary | ICD-10-CM | POA: Diagnosis not present

## 2014-11-08 DIAGNOSIS — K5730 Diverticulosis of large intestine without perforation or abscess without bleeding: Secondary | ICD-10-CM | POA: Diagnosis not present

## 2014-11-08 DIAGNOSIS — Z1211 Encounter for screening for malignant neoplasm of colon: Secondary | ICD-10-CM | POA: Diagnosis not present

## 2014-11-16 ENCOUNTER — Ambulatory Visit (HOSPITAL_BASED_OUTPATIENT_CLINIC_OR_DEPARTMENT_OTHER): Payer: Medicare Other | Admitting: Hematology & Oncology

## 2014-11-16 ENCOUNTER — Encounter: Payer: Self-pay | Admitting: Hematology & Oncology

## 2014-11-16 ENCOUNTER — Other Ambulatory Visit (HOSPITAL_BASED_OUTPATIENT_CLINIC_OR_DEPARTMENT_OTHER): Payer: Medicare Other

## 2014-11-16 VITALS — BP 142/61 | HR 74 | Temp 98.1°F | Resp 16 | Ht 60.0 in | Wt 239.0 lb

## 2014-11-16 DIAGNOSIS — C8580 Other specified types of non-Hodgkin lymphoma, unspecified site: Secondary | ICD-10-CM

## 2014-11-16 DIAGNOSIS — D630 Anemia in neoplastic disease: Secondary | ICD-10-CM | POA: Diagnosis not present

## 2014-11-16 DIAGNOSIS — C8590 Non-Hodgkin lymphoma, unspecified, unspecified site: Secondary | ICD-10-CM

## 2014-11-16 DIAGNOSIS — D638 Anemia in other chronic diseases classified elsewhere: Secondary | ICD-10-CM | POA: Diagnosis present

## 2014-11-16 DIAGNOSIS — D500 Iron deficiency anemia secondary to blood loss (chronic): Secondary | ICD-10-CM

## 2014-11-16 LAB — CBC WITH DIFFERENTIAL (CANCER CENTER ONLY)
BASO#: 0 10*3/uL (ref 0.0–0.2)
BASO%: 0.2 % (ref 0.0–2.0)
EOS%: 1.1 % (ref 0.0–7.0)
Eosinophils Absolute: 0.1 10*3/uL (ref 0.0–0.5)
HCT: 31 % — ABNORMAL LOW (ref 34.8–46.6)
HGB: 10.5 g/dL — ABNORMAL LOW (ref 11.6–15.9)
LYMPH#: 1.5 10*3/uL (ref 0.9–3.3)
LYMPH%: 28.9 % (ref 14.0–48.0)
MCH: 35.6 pg — ABNORMAL HIGH (ref 26.0–34.0)
MCHC: 33.9 g/dL (ref 32.0–36.0)
MCV: 105 fL — ABNORMAL HIGH (ref 81–101)
MONO#: 0.4 10*3/uL (ref 0.1–0.9)
MONO%: 7.2 % (ref 0.0–13.0)
NEUT#: 3.3 10*3/uL (ref 1.5–6.5)
NEUT%: 62.6 % (ref 39.6–80.0)
Platelets: 163 10*3/uL (ref 145–400)
RBC: 2.95 10*6/uL — ABNORMAL LOW (ref 3.70–5.32)
RDW: 13.7 % (ref 11.1–15.7)
WBC: 5.3 10*3/uL (ref 3.9–10.0)

## 2014-11-16 LAB — COMPREHENSIVE METABOLIC PANEL
ALT: 13 U/L (ref 0–35)
AST: 17 U/L (ref 0–37)
Albumin: 3.9 g/dL (ref 3.5–5.2)
Alkaline Phosphatase: 56 U/L (ref 39–117)
BUN: 18 mg/dL (ref 6–23)
CO2: 27 mEq/L (ref 19–32)
Calcium: 9.7 mg/dL (ref 8.4–10.5)
Chloride: 103 mEq/L (ref 96–112)
Creatinine, Ser: 0.73 mg/dL (ref 0.50–1.10)
Glucose, Bld: 109 mg/dL — ABNORMAL HIGH (ref 70–99)
Potassium: 4 mEq/L (ref 3.5–5.3)
Sodium: 141 mEq/L (ref 135–145)
Total Bilirubin: 0.4 mg/dL (ref 0.2–1.2)
Total Protein: 6.5 g/dL (ref 6.0–8.3)

## 2014-11-16 LAB — RETICULOCYTES (CHCC)
ABS Retic: 42.3 10*3/uL (ref 19.0–186.0)
RBC.: 3.02 MIL/uL — ABNORMAL LOW (ref 3.87–5.11)
Retic Ct Pct: 1.4 % (ref 0.4–2.3)

## 2014-11-16 LAB — IRON AND TIBC CHCC
%SAT: 36 % (ref 21–57)
Iron: 90 ug/dL (ref 41–142)
TIBC: 254 ug/dL (ref 236–444)
UIBC: 163 ug/dL (ref 120–384)

## 2014-11-16 LAB — FERRITIN CHCC: Ferritin: 282 ng/ml — ABNORMAL HIGH (ref 9–269)

## 2014-11-16 NOTE — Progress Notes (Signed)
Hematology and Oncology Follow Up Visit  Helen Hardy [id number] 1950/11/13 64 y.o. 11/16/2014   Principle Diagnosis:  1. Diffuse small cell non-Hodgkin lymphoma-clinical remission. 2. Rheumatoid arthritis. 3. Anemia of chronic disease.  Current Therapy:    Observation     Interim History:  Ms.  Helen Hardy is a followup. We saw her 6 months ago. Unfortunately, in September, she fell and tore her left shoulder. She needed surgery for this. She had arthroscopic surgery.   She seems to be doing a little bit better with her left shoulder. There is still some limited range of motion.  She still takes Orencia for the rheumatoid arthritis. This does make her tired.  Her appetite has improved good. She's had no nausea vomiting.  She is quite overweight. She does not have any cough or shortness of breath.   Medications:  Current outpatient prescriptions:  .  Abatacept (ORENCIA IV), Inject into the vein every 30 (thirty) days. Gets at [facility]., Disp: , Rfl:  .  Acetaminophen (EQL ARTHRITIS PAIN RELIEF PO), Take 500 mg by mouth 2 (two) times daily., Disp: , Rfl:  .  Cholecalciferol (VITAMIN D) 2000 UNITS CAPS, Take 2,000 Units by mouth 2 (two) times daily., Disp: , Rfl:  .  diazepam (VALIUM) 5 MG tablet, Take 2.5-5 mg by mouth every 6 (six) hours as needed for muscle spasms., Disp: , Rfl:  .  estradiol (ESTRACE) 1 MG tablet, Take 1 mg by mouth daily. , Disp: , Rfl:  .  folic acid (FOLVITE) 1 MG tablet, Take 1 mg by mouth daily., Disp: , Rfl:  .  HYDROcodone-acetaminophen (NORCO/VICODIN) 5-325 MG per tablet, Take 1 tablet by mouth every 6 (six) hours as needed for pain., Disp: , Rfl:  .  leflunomide (ARAVA) 10 MG tablet, Take 10 mg by mouth daily. , Disp: , Rfl:  .  Multiple Vitamin (MULTIVITAMIN) tablet, Take 1 tablet by mouth daily., Disp: , Rfl:  .  pantoprazole (PROTONIX) 40 MG tablet, Take 40 mg by mouth daily., Disp: , Rfl:  .  predniSONE (DELTASONE) 5 MG  tablet, , Disp: , Rfl:  .  predniSONE (STERAPRED UNI-PAK) 5 MG TABS, Take 5 mg by mouth daily., Disp: , Rfl:  .  triamcinolone cream (KENALOG) 0.1 %, , Disp: , Rfl:  .  valsartan-hydrochlorothiazide (DIOVAN-HCT) 160-25 MG per tablet, Take 1 tablet by mouth daily. Takes 1/2 daily., Disp: , Rfl:  .  vitamin B-12 (CYANOCOBALAMIN) 1000 MCG tablet, Take 1,000 mcg by mouth daily., Disp: , Rfl:   Allergies:  Allergies  Allergen Reactions  . Oxycodone Nausea And Vomiting    Past Medical History, Surgical history, Social history, and Family History were reviewed and updated.  Review of Systems: As above  Physical Exam:  height is 5' (1.524 m) and weight is 239 lb (108.41 kg). Her temperature is 98.1 F (36.7 C). Her blood pressure is 142/61 and her pulse is 74. Her respiration is 16.   Obese African American female in no obvious distress. Head and neck exam shows no ocular or oral lesion. She is no palpable cervical or supraclavicular lymph nodes. Lungs are clear. Cardiac exam regular rate and rhythm with no murmurs rubs or bruits. Abdomen soft. She good bowel sounds. She is moderately obese. She has no fluid wave. There is no palpable liver or spleen tip. Neck exam shows no tenderness over the spine ribs or hips. Extremities shows some slight joint swelling in her hands who has good range of  motion of her joints. Has good strength in her extremities. Neurological exam is nonfocal. Skin exam no rashes.  Lab Results  Component Value Date   WBC 5.3 11/16/2014   HGB 10.5* 11/16/2014   HCT 31.0* 11/16/2014   MCV 105* 11/16/2014   PLT 163 11/16/2014     Chemistry      Component Value Date/Time   NA 142 07/20/2014 1115   NA 140 01/19/2014 1118   K 3.9 07/20/2014 1115   K 4.1 01/19/2014 1118   CL 103 01/19/2014 1118   CO2 24 07/20/2014 1115   CO2 27 01/19/2014 1118   BUN 14.9 07/20/2014 1115   BUN 16 01/19/2014 1118   CREATININE 0.7 07/20/2014 1115   CREATININE 0.83 01/19/2014 1118       Component Value Date/Time   CALCIUM 9.1 07/20/2014 1115   CALCIUM 9.6 01/19/2014 1118   ALKPHOS 61 07/20/2014 1115   ALKPHOS 58 01/19/2014 1118   AST 17 07/20/2014 1115   AST 18 01/19/2014 1118   ALT 12 07/20/2014 1115   ALT 13 01/19/2014 1118   BILITOT 0.46 07/20/2014 1115   BILITOT 0.4 01/19/2014 1118         Impression and Plan: Helen Hardy is 64 year old African [place] female. She is a past history of a low-grade non-Hodgkin's lymphoma. We've not had to treat her now for about 13 years. Again she is done incredibly well. I am just surprised that she has not had any recurrence but I certainly will be grateful for this.  She is doing quite well. I think the anemia is more of an anemia of chronic disease. She does have bad rheumatoid arthritis. I don't think this is anything related to her having lymphoma or her treatment for her lymphoma in the past. As such, I don't think she needs to have a bone marrow test done.  I do want to get her back in about 4 months. We will continue to monitor her anemia.   Volanda Napoleon, MD 5/13/20164:24 PM

## 2014-11-19 LAB — ERYTHROPOIETIN: Erythropoietin: 83.6 m[IU]/mL — ABNORMAL HIGH (ref 2.6–18.5)

## 2014-11-20 DIAGNOSIS — M0589 Other rheumatoid arthritis with rheumatoid factor of multiple sites: Secondary | ICD-10-CM | POA: Diagnosis not present

## 2014-12-18 DIAGNOSIS — M0589 Other rheumatoid arthritis with rheumatoid factor of multiple sites: Secondary | ICD-10-CM | POA: Diagnosis not present

## 2014-12-27 DIAGNOSIS — M0579 Rheumatoid arthritis with rheumatoid factor of multiple sites without organ or systems involvement: Secondary | ICD-10-CM | POA: Diagnosis not present

## 2015-01-04 DIAGNOSIS — M25561 Pain in right knee: Secondary | ICD-10-CM | POA: Diagnosis not present

## 2015-01-04 DIAGNOSIS — M25562 Pain in left knee: Secondary | ICD-10-CM | POA: Diagnosis not present

## 2015-01-04 DIAGNOSIS — Z96653 Presence of artificial knee joint, bilateral: Secondary | ICD-10-CM | POA: Diagnosis not present

## 2015-01-08 ENCOUNTER — Other Ambulatory Visit (HOSPITAL_COMMUNITY): Payer: Self-pay | Admitting: Orthopedic Surgery

## 2015-01-08 DIAGNOSIS — Z96653 Presence of artificial knee joint, bilateral: Secondary | ICD-10-CM

## 2015-01-10 DIAGNOSIS — G629 Polyneuropathy, unspecified: Secondary | ICD-10-CM | POA: Diagnosis not present

## 2015-01-10 DIAGNOSIS — M0579 Rheumatoid arthritis with rheumatoid factor of multiple sites without organ or systems involvement: Secondary | ICD-10-CM | POA: Diagnosis not present

## 2015-01-15 DIAGNOSIS — M0579 Rheumatoid arthritis with rheumatoid factor of multiple sites without organ or systems involvement: Secondary | ICD-10-CM | POA: Diagnosis not present

## 2015-01-16 ENCOUNTER — Ambulatory Visit (HOSPITAL_COMMUNITY)
Admission: RE | Admit: 2015-01-16 | Discharge: 2015-01-16 | Disposition: A | Discharge status: Home / Self Care | Payer: Medicare Other | Source: Ambulatory Visit | Attending: Orthopedic Surgery | Admitting: Orthopedic Surgery

## 2015-01-16 DIAGNOSIS — Z471 Aftercare following joint replacement surgery: Secondary | ICD-10-CM | POA: Diagnosis not present

## 2015-01-16 DIAGNOSIS — M25561 Pain in right knee: Secondary | ICD-10-CM | POA: Insufficient documentation

## 2015-01-16 DIAGNOSIS — Z96653 Presence of artificial knee joint, bilateral: Secondary | ICD-10-CM

## 2015-01-16 DIAGNOSIS — M25562 Pain in left knee: Secondary | ICD-10-CM | POA: Diagnosis not present

## 2015-01-16 MED ORDER — TECHNETIUM TC 99M MEDRONATE IV KIT
25.0000 | PACK | Freq: Once | INTRAVENOUS | Status: AC | PRN
Start: 2015-01-16 — End: 2015-01-16
  Administered 2015-01-16: 26.7 via INTRAVENOUS

## 2015-01-22 DIAGNOSIS — S030XXA Dislocation of jaw, initial encounter: Secondary | ICD-10-CM | POA: Diagnosis not present

## 2015-02-01 DIAGNOSIS — M25561 Pain in right knee: Secondary | ICD-10-CM | POA: Diagnosis not present

## 2015-02-12 DIAGNOSIS — M0579 Rheumatoid arthritis with rheumatoid factor of multiple sites without organ or systems involvement: Secondary | ICD-10-CM | POA: Diagnosis not present

## 2015-02-12 DIAGNOSIS — R5383 Other fatigue: Secondary | ICD-10-CM | POA: Diagnosis not present

## 2015-03-12 DIAGNOSIS — M0579 Rheumatoid arthritis with rheumatoid factor of multiple sites without organ or systems involvement: Secondary | ICD-10-CM | POA: Diagnosis not present

## 2015-03-14 DIAGNOSIS — G629 Polyneuropathy, unspecified: Secondary | ICD-10-CM | POA: Diagnosis not present

## 2015-03-14 DIAGNOSIS — M0579 Rheumatoid arthritis with rheumatoid factor of multiple sites without organ or systems involvement: Secondary | ICD-10-CM | POA: Diagnosis not present

## 2015-03-14 DIAGNOSIS — Z23 Encounter for immunization: Secondary | ICD-10-CM | POA: Diagnosis not present

## 2015-03-14 DIAGNOSIS — M1990 Unspecified osteoarthritis, unspecified site: Secondary | ICD-10-CM | POA: Diagnosis not present

## 2015-03-18 DIAGNOSIS — Z01419 Encounter for gynecological examination (general) (routine) without abnormal findings: Secondary | ICD-10-CM | POA: Diagnosis not present

## 2015-03-18 DIAGNOSIS — Z79890 Hormone replacement therapy (postmenopausal): Secondary | ICD-10-CM | POA: Diagnosis not present

## 2015-03-18 DIAGNOSIS — Z130 Encounter for screening for diseases of the blood and blood-forming organs and certain disorders involving the immune mechanism: Secondary | ICD-10-CM | POA: Diagnosis not present

## 2015-03-21 ENCOUNTER — Encounter: Payer: Self-pay | Admitting: Hematology & Oncology

## 2015-03-21 ENCOUNTER — Other Ambulatory Visit (HOSPITAL_BASED_OUTPATIENT_CLINIC_OR_DEPARTMENT_OTHER): Payer: Medicare Other

## 2015-03-21 ENCOUNTER — Ambulatory Visit (HOSPITAL_BASED_OUTPATIENT_CLINIC_OR_DEPARTMENT_OTHER): Payer: Medicare Other | Admitting: Hematology & Oncology

## 2015-03-21 VITALS — BP 103/52 | HR 94 | Temp 97.5°F | Resp 16 | Ht 60.0 in | Wt 238.0 lb

## 2015-03-21 DIAGNOSIS — N182 Chronic kidney disease, stage 2 (mild): Secondary | ICD-10-CM | POA: Diagnosis not present

## 2015-03-21 DIAGNOSIS — C8590 Non-Hodgkin lymphoma, unspecified, unspecified site: Secondary | ICD-10-CM

## 2015-03-21 DIAGNOSIS — M069 Rheumatoid arthritis, unspecified: Secondary | ICD-10-CM | POA: Diagnosis not present

## 2015-03-21 DIAGNOSIS — D509 Iron deficiency anemia, unspecified: Secondary | ICD-10-CM

## 2015-03-21 DIAGNOSIS — D500 Iron deficiency anemia secondary to blood loss (chronic): Secondary | ICD-10-CM | POA: Diagnosis not present

## 2015-03-21 DIAGNOSIS — D631 Anemia in chronic kidney disease: Secondary | ICD-10-CM | POA: Diagnosis not present

## 2015-03-21 LAB — COMPREHENSIVE METABOLIC PANEL (CC13)
ALT: 16 U/L (ref 0–55)
AST: 19 U/L (ref 5–34)
Albumin: 3.8 g/dL (ref 3.5–5.0)
Alkaline Phosphatase: 60 U/L (ref 40–150)
Anion Gap: 10 mEq/L (ref 3–11)
BUN: 24.2 mg/dL (ref 7.0–26.0)
CO2: 25 mEq/L (ref 22–29)
Calcium: 9.9 mg/dL (ref 8.4–10.4)
Chloride: 108 mEq/L (ref 98–109)
Creatinine: 0.9 mg/dL (ref 0.6–1.1)
EGFR: 83 mL/min/{1.73_m2} — ABNORMAL LOW (ref >90)
Glucose: 118 mg/dl (ref 70–140)
Potassium: 3.7 mEq/L (ref 3.5–5.1)
Sodium: 143 mEq/L (ref 136–145)
Total Bilirubin: 0.39 mg/dL (ref 0.20–1.20)
Total Protein: 6.9 g/dL (ref 6.4–8.3)

## 2015-03-21 LAB — IRON AND TIBC CHCC
%SAT: 30 % (ref 21–57)
Iron: 84 ug/dL (ref 41–142)
TIBC: 283 ug/dL (ref 236–444)
UIBC: 198 ug/dL (ref 120–384)

## 2015-03-21 LAB — CBC WITH DIFFERENTIAL (CANCER CENTER ONLY)
BASO#: 0 10*3/uL (ref 0.0–0.2)
BASO%: 0.2 % (ref 0.0–2.0)
EOS%: 0.7 % (ref 0.0–7.0)
Eosinophils Absolute: 0 10*3/uL (ref 0.0–0.5)
HCT: 31.8 % — ABNORMAL LOW (ref 34.8–46.6)
HGB: 10.8 g/dL — ABNORMAL LOW (ref 11.6–15.9)
LYMPH#: 1.4 10*3/uL (ref 0.9–3.3)
LYMPH%: 25.5 % (ref 14.0–48.0)
MCH: 36 pg — ABNORMAL HIGH (ref 26.0–34.0)
MCHC: 34 g/dL (ref 32.0–36.0)
MCV: 106 fL — ABNORMAL HIGH (ref 81–101)
MONO#: 0.4 10*3/uL (ref 0.1–0.9)
MONO%: 7.1 % (ref 0.0–13.0)
NEUT#: 3.7 10*3/uL (ref 1.5–6.5)
NEUT%: 66.5 % (ref 39.6–80.0)
Platelets: 181 10*3/uL (ref 145–400)
RBC: 3 10*6/uL — ABNORMAL LOW (ref 3.70–5.32)
RDW: 13.8 % (ref 11.1–15.7)
WBC: 5.5 10*3/uL (ref 3.9–10.0)

## 2015-03-21 LAB — RETICULOCYTES (CHCC)
ABS Retic: 48.8 10*3/uL (ref 19.0–186.0)
RBC.: 3.05 MIL/uL — ABNORMAL LOW (ref 3.87–5.11)
Retic Ct Pct: 1.6 % (ref 0.4–2.3)

## 2015-03-21 LAB — FERRITIN CHCC: Ferritin: 340 ng/ml — ABNORMAL HIGH (ref 9–269)

## 2015-03-21 NOTE — Progress Notes (Signed)
Hematology and Oncology Follow Up Visit  Helen Hardy [id number] January 28, 1951 64 y.o. 03/21/2015   Principle Diagnosis:  1. Diffuse small cell non-Hodgkin lymphoma-clinical remission. 2. Rheumatoid arthritis. 3. Anemia of chronic disease.  Current Therapy:    Observation     Interim History:  Helen Hardy is a followup. She is going to have knee surgery for the right knee on October 3. Dr. Adriana Mccallum will do the procedure.  I told her that I will see her while she is in the hospital.  Otherwise, she is doing well. Her retorted arthritis is not doing too bad. She has a new rheumatologist now. She is on folic acid at 2 mg a day.  She's had no nausea or vomiting. She  She's had no bleeding.  There's been no change in bowel or bladder habits.  As always, there is a lot of issues going on with her family that she likes to talk about.  Overall, her performance status is ECOG 1.  Medications:  Current outpatient prescriptions:  .  Abatacept (ORENCIA IV), Inject into the vein every 30 (thirty) days. Gets at [facility]., Disp: , Rfl:  .  acetaminophen (TYLENOL) 650 MG CR tablet, Take 650 mg by mouth every 8 (eight) hours as needed for pain., Disp: , Rfl:  .  Cholecalciferol (VITAMIN D) 2000 UNITS CAPS, Take 2,000 Units by mouth 2 (two) times daily., Disp: , Rfl:  .  cyclobenzaprine (FLEXERIL) 10 MG tablet, Take 5 mg by mouth at bedtime., Disp: , Rfl:  .  estradiol (ESTRACE) 1 MG tablet, Take 1 mg by mouth daily. , Disp: , Rfl:  .  folic acid (FOLVITE) 979 MCG tablet, Take 800 mcg by mouth 2 (two) times daily., Disp: , Rfl:  .  HYDROcodone-acetaminophen (NORCO/VICODIN) 5-325 MG per tablet, Take 1 tablet by mouth every 6 (six) hours as needed for pain., Disp: , Rfl:  .  leflunomide (ARAVA) 10 MG tablet, Take 10 mg by mouth daily. , Disp: , Rfl:  .  Multiple Vitamin (MULTIVITAMIN) tablet, Take 1 tablet by mouth daily., Disp: , Rfl:  .  pantoprazole (PROTONIX) 40 MG  tablet, Take 40 mg by mouth 2 (two) times daily. , Disp: , Rfl:  .  predniSONE (DELTASONE) 5 MG tablet, Take 5 mg by mouth daily with breakfast. , Disp: , Rfl:  .  triamcinolone cream (KENALOG) 0.1 %, Apply 1 application topically daily as needed (break out on neck from heat). , Disp: , Rfl:  .  valsartan-hydrochlorothiazide (DIOVAN-HCT) 160-25 MG per tablet, Take 1 tablet by mouth daily. , Disp: , Rfl:  .  vitamin B-12 (CYANOCOBALAMIN) 1000 MCG tablet, Take 1,000 mcg by mouth daily., Disp: , Rfl:   Allergies:  Allergies  Allergen Reactions  . Oxycodone Nausea And Vomiting    Past Medical History, Surgical history, Social history, and Family History were reviewed and updated.  Review of Systems: As above  Physical Exam:  height is 5' (1.524 m) and weight is 238 lb (107.956 kg). Her oral temperature is 97.5 F (36.4 C). Her blood pressure is 103/52 and her pulse is 94. Her respiration is 16.   Obese African American female in no obvious distress. Head and neck exam shows no ocular or oral lesion. She is no palpable cervical or supraclavicular lymph nodes. Lungs are clear. Cardiac exam regular rate and rhythm with no murmurs rubs or bruits. Abdomen soft. She good bowel sounds. She is moderately obese. She has no fluid  wave. There is no palpable liver or spleen tip. Neck exam shows no tenderness over the spine ribs or hips. Extremities shows some slight joint swelling in her hands who has good range of motion of her joints. Has good strength in her extremities. Neurological exam is nonfocal. Skin exam no rashes.  Lab Results  Component Value Date   WBC 5.5 03/21/2015   HGB 10.8* 03/21/2015   HCT 31.8* 03/21/2015   MCV 106* 03/21/2015   PLT 181 03/21/2015     Chemistry      Component Value Date/Time   NA 141 11/16/2014 1033   NA 142 07/20/2014 1115   K 4.0 11/16/2014 1033   K 3.9 07/20/2014 1115   CL 103 11/16/2014 1033   CO2 27 11/16/2014 1033   CO2 24 07/20/2014 1115   BUN 18  11/16/2014 1033   BUN 14.9 07/20/2014 1115   CREATININE 0.73 11/16/2014 1033   CREATININE 0.7 07/20/2014 1115      Component Value Date/Time   CALCIUM 9.7 11/16/2014 1033   CALCIUM 9.1 07/20/2014 1115   ALKPHOS 56 11/16/2014 1033   ALKPHOS 61 07/20/2014 1115   AST 17 11/16/2014 1033   AST 17 07/20/2014 1115   ALT 13 11/16/2014 1033   ALT 12 07/20/2014 1115   BILITOT 0.4 11/16/2014 1033   BILITOT 0.46 07/20/2014 1115         Impression and Plan: Helen Hardy is 64 year old African [place] female. She is a past history of a low-grade non-Hodgkin's lymphoma. We've not had to treat her now for about 13 years. Again she is done incredibly well. I am just surprised that she has not had any recurrence but I certainly will be grateful for this.  I do not see any problems with her having surgery for her knee. It sounds like this will be a inpatient procedure that she wean hospital for a couple days. I will make sure to see her while she is in the hospital.  She is mildly anemic but I really think that this should cause any problems for her. Her white cell count is good and her platelet count is good so she should be able to heal without an increased risk of infection or bleeding.  I'll let see her back in about 2-3 months.   Volanda Napoleon, MD 9/15/201611:06 AM

## 2015-03-22 NOTE — Patient Instructions (Signed)
Helen Hardy  03/22/2015   Your procedure is scheduled on: April 08, 2015  Report to [facility] Main  Entrance take [place]  elevators to 3rd floor to  [place] at  5:30 AM.  Call this number if you have problems the morning of surgery [phone]   Remember: ONLY 1 PERSON MAY GO WITH YOU TO SHORT STAY TO GET  READY MORNING OF [place].  Do not eat food or drink liquids :After Midnight.     Take these medicines the morning of surgery with A SIP OF WATER: Hydrocodone if needed, Protonix                               You may not have any metal on your body including hair pins and              piercings  Do not wear jewelry, make-up, lotions, powders or perfumes, deodorant             Do not wear nail polish.  Do not shave  48 hours prior to surgery.               Do not bring valuables to the hospital. [place].  Contacts, dentures or bridgework may not be worn into surgery.  Leave suitcase in the car. After surgery it may be brought to your room.        Special Instructions: coughing and deep breathing exercises, leg exercises              Please read over the following fact sheets you were given: _____________________________________________________________________             [facility] - Preparing for Surgery Before surgery, you can play an important role.  Because skin is not sterile, your skin needs to be as free of germs as possible.  You can reduce the number of germs on your skin by washing with CHG (chlorahexidine gluconate) soap before surgery.  CHG is an antiseptic cleaner which kills germs and bonds with the skin to continue killing germs even after washing. Please DO NOT use if you have an allergy to CHG or antibacterial soaps.  If your skin becomes reddened/irritated stop using the CHG and inform your nurse when you arrive at Short Stay. Do not shave (including legs and  underarms) for at least 48 hours prior to the first CHG shower.  You may shave your face/neck. Please follow these instructions carefully:  1.  Shower with CHG Soap the night before surgery and the  morning of Surgery.  2.  If you choose to wash your hair, wash your hair first as usual with your  normal  shampoo.  3.  After you shampoo, rinse your hair and body thoroughly to remove the  shampoo.                           4.  Use CHG as you would any other liquid soap.  You can apply chg directly  to the skin and wash                       Gently with a scrungie or  clean washcloth.  5.  Apply the CHG Soap to your body ONLY FROM THE NECK DOWN.   Do not use on face/ open                           Wound or open sores. Avoid contact with eyes, ears mouth and genitals (private parts).                       Wash face,  Genitals (private parts) with your normal soap.             6.  Wash thoroughly, paying special attention to the area where your surgery  will be performed.  7.  Thoroughly rinse your body with warm water from the neck down.  8.  DO NOT shower/wash with your normal soap after using and rinsing off  the CHG Soap.                9.  Pat yourself dry with a clean towel.            10.  Wear clean pajamas.            11.  Place clean sheets on your bed the night of your first shower and do not  sleep with pets. Day of Surgery : Do not apply any lotions/deodorants the morning of surgery.  Please wear clean clothes to the hospital/surgery center.  FAILURE TO FOLLOW THESE INSTRUCTIONS MAY RESULT IN THE CANCELLATION OF YOUR SURGERY PATIENT SIGNATURE_________________________________  NURSE SIGNATURE__________________________________  ________________________________________________________________________   Helen Hardy  An incentive spirometer is a tool that can help keep your lungs clear and active. This tool measures how well you are filling your lungs with each breath. Taking  long deep breaths may help reverse or decrease the chance of developing breathing (pulmonary) problems (especially infection) following:  A long period of time when you are unable to move or be active. BEFORE THE PROCEDURE   If the spirometer includes an indicator to show your best effort, your nurse or respiratory therapist will set it to a desired goal.  If possible, sit up straight or lean slightly forward. Try not to slouch.  Hold the incentive spirometer in an upright position. INSTRUCTIONS FOR USE   Sit on the edge of your bed if possible, or sit up as far as you can in bed or on a chair.  Hold the incentive spirometer in an upright position.  Breathe out normally.  Place the mouthpiece in your mouth and seal your lips tightly around it.  Breathe in slowly and as deeply as possible, raising the piston or the ball toward the top of the column.  Hold your breath for 3-5 seconds or for as long as possible. Allow the piston or ball to fall to the bottom of the column.  Remove the mouthpiece from your mouth and breathe out normally.  Rest for a few seconds and repeat Steps 1 through 7 at least 10 times every 1-2 hours when you are awake. Take your time and take a few normal breaths between deep breaths.  The spirometer may include an indicator to show your best effort. Use the indicator as a goal to work toward during each repetition.  After each set of 10 deep breaths, practice coughing to be sure your lungs are clear. If you have an incision (the cut made at the time of surgery), support your incision when coughing  by placing a pillow or rolled up towels firmly against it. Once you are able to get out of bed, walk around indoors and cough well. You may stop using the incentive spirometer when instructed by your caregiver.  RISKS AND COMPLICATIONS  Take your time so you do not get dizzy or light-headed.  If you are in pain, you may need to take or ask for pain medication before  doing incentive spirometry. It is harder to take a deep breath if you are having pain. AFTER USE  Rest and breathe slowly and easily.  It can be helpful to keep track of a log of your progress. Your caregiver can provide you with a simple table to help with this. If you are using the spirometer at home, follow these instructions: [place] IF:   You are having difficultly using the spirometer.  You have trouble using the spirometer as often as instructed.  Your pain medication is not giving enough relief while using the spirometer.  You develop fever of 100.5 F (38.1 C) or higher. SEEK IMMEDIATE MEDICAL CARE IF:   You cough up bloody sputum that had not been present before.  You develop fever of 102 F (38.9 C) or greater.  You develop worsening pain at or near the incision site. MAKE SURE YOU:   Understand these instructions.  Will watch your condition.  Will get help right away if you are not doing well or get worse. Document Released: 11/02/2006 Document Revised: 09/14/2011 Document Reviewed: 01/03/2007 ExitCare Patient Information 2014 ExitCare, [place].   ________________________________________________________________________  WHAT IS A BLOOD TRANSFUSION? Blood Transfusion Information  A transfusion is the replacement of blood or some of its parts. Blood is made up of multiple cells which provide different functions.  Red blood cells carry oxygen and are used for blood loss replacement.  White blood cells fight against infection.  Platelets control bleeding.  Plasma helps clot blood.  Other blood products are available for specialized needs, such as hemophilia or other clotting disorders. BEFORE THE TRANSFUSION  Who gives blood for transfusions?   Healthy volunteers who are fully evaluated to make sure their blood is safe. This is blood bank blood. Transfusion therapy is the safest it has ever been in the practice of medicine. Before blood is taken  from a donor, a complete history is taken to make sure that person has no history of diseases nor engages in risky social behavior (examples are intravenous drug use or sexual activity with multiple partners). The donor's travel history is screened to minimize risk of transmitting infections, such as malaria. The donated blood is tested for signs of infectious diseases, such as HIV and hepatitis. The blood is then tested to be sure it is compatible with you in order to minimize the chance of a transfusion reaction. If you or a relative donates blood, this is often done in anticipation of surgery and is not appropriate for emergency situations. It takes many days to process the donated blood. RISKS AND COMPLICATIONS Although transfusion therapy is very safe and saves many lives, the main dangers of transfusion include:   Getting an infectious disease.  Developing a transfusion reaction. This is an allergic reaction to something in the blood you were given. Every precaution is taken to prevent this. The decision to have a blood transfusion has been considered carefully by your caregiver before blood is given. Blood is not given unless the benefits outweigh the risks. AFTER THE TRANSFUSION  Right after receiving a  blood transfusion, you will usually feel much better and more energetic. This is especially true if your red blood cells have gotten low (anemic). The transfusion raises the level of the red blood cells which carry oxygen, and this usually causes an energy increase.  The nurse administering the transfusion will monitor you carefully for complications. HOME CARE INSTRUCTIONS  No special instructions are needed after a transfusion. You may find your energy is better. Speak with your caregiver about any limitations on activity for underlying diseases you may have. SEEK MEDICAL CARE IF:   Your condition is not improving after your transfusion.  You develop redness or irritation at the  intravenous (IV) site. SEEK IMMEDIATE MEDICAL CARE IF:  Any of the following symptoms occur over the next 12 hours:  Shaking chills.  You have a temperature by mouth above 102 F (38.9 C), not controlled by medicine.  Chest, back, or muscle pain.  People around you feel you are not acting correctly or are confused.  Shortness of breath or difficulty breathing.  Dizziness and fainting.  You get a rash or develop hives.  You have a decrease in urine output.  Your urine turns a dark color or changes to pink, red, or brown. Any of the following symptoms occur over the next 10 days:  You have a temperature by mouth above 102 F (38.9 C), not controlled by medicine.  Shortness of breath.  Weakness after normal activity.  The white part of the eye turns yellow (jaundice).  You have a decrease in the amount of urine or are urinating less often.  Your urine turns a dark color or changes to pink, red, or brown. Document Released: 06/19/2000 Document Revised: 09/14/2011 Document Reviewed: 02/06/2008 [employer] Patient Information 2014 [place], [place].  _______________________________________________________________________

## 2015-03-27 ENCOUNTER — Other Ambulatory Visit: Payer: Self-pay | Admitting: Orthopedic Surgery

## 2015-03-27 DIAGNOSIS — M1712 Unilateral primary osteoarthritis, left knee: Secondary | ICD-10-CM

## 2015-03-27 NOTE — H&P (Signed)
TOTAL KNEE REVISION ADMISSION H&P  Patient is being admitted for left revision total knee arthroplasty.  Subjective:  Chief Complaint:     Left knee pain s/p left TKA  HPI: Helen Hardy, 64 y.o. female, has a history of pain and functional disability in the left knee(s) due to failed previous arthroplasty and patient has failed non-surgical conservative treatments for greater than 12 weeks to include NSAID's and/or analgesics, use of assistive devices and activity modification. The indications for the revision of the total knee arthroplasty are loosening of one or more components. Onset of symptoms was gradual starting 1+ years ago with gradually worsening course since that time.  Prior procedures on the left knee(s) include arthroplasty.  Patient currently rates pain in the left knee(s) at 9 out of 10 with activity. There is worsening of pain with activity and weight bearing, pain that interferes with activities of daily living, pain with passive range of motion and joint swelling.  Patient has evidence of prosthetic loosening by imaging studies. This condition presents safety issues increasing the risk of falls.   There is no current active infection.  Risks, benefits and expectations were discussed with the patient.  Risks including but not limited to the risk of anesthesia, blood clots, nerve damage, blood vessel damage, failure of the prosthesis, infection and up to and including death.  Patient understand the risks, benefits and expectations and wishes to proceed with surgery.   PCP: Horatio Pel, MD  D/C Plans:      Home with HHPT  Post-op Meds:       No Rx given   Tranexamic Acid:      To be given - IV   Decadron:      Is to be given  FYI:     ASA post-op  Norco post-op    Past Medical History  Diagnosis Date  . Arthritis     Rheumatoid  . Cancer     Hodkins Lymphoma    Past Surgical History  Procedure Laterality Date  . Rotator cuff repair      No  prescriptions prior to admission   Allergies  Allergen Reactions  . Oxycodone Nausea And Vomiting    Social History  Substance Use Topics  . Smoking status: Never Smoker   . Smokeless tobacco: Never Used  . Alcohol Use: No       Review of Systems  Constitutional: Negative.   HENT: Negative.   Eyes: Negative.   Respiratory: Negative.   Cardiovascular: Negative.   Gastrointestinal: Negative.   Genitourinary: Negative.   Musculoskeletal: Positive for joint pain.  Skin: Negative.   Neurological: Negative.   Endo/Heme/Allergies: Negative.   Psychiatric/Behavioral: Negative.      Objective:  Physical Exam  Constitutional: She is oriented to person, place, and time. She appears well-developed and well-nourished.  HENT:  Head: Normocephalic and atraumatic.  Eyes: Pupils are equal, round, and reactive to light.  Neck: Neck supple. No JVD present. No tracheal deviation present. No thyromegaly present.  Cardiovascular: Normal rate, regular rhythm, normal heart sounds and intact distal pulses.   Respiratory: Effort normal and breath sounds normal. No stridor. No respiratory distress. She has no wheezes.  GI: Soft. There is no tenderness. There is no guarding.  Musculoskeletal:       Left knee: She exhibits decreased range of motion, swelling, laceration (healed previous incision (keloid)) and bony tenderness. She exhibits no ecchymosis, no deformity and no erythema. Tenderness found.  Lymphadenopathy:    She  has no cervical adenopathy.  Neurological: She is alert and oriented to person, place, and time.  Skin: Skin is warm and dry.  Psychiatric: She has a normal mood and affect.     Labs:  Estimated body mass index is 46.68 kg/(m^2) as calculated from the following:   Height as of 11/16/14: 5' (1.524 m).   Weight as of 11/16/14: 108.41 kg (239 lb).  Imaging Review Plain radiographs demonstrate previous TKA of the left knee(s). The overall alignment is neutral.There is  evidence of loosening of the components. The bone quality appears to be good for age and reported activity level.   Assessment/Plan:  Left knee with failed previous arthroplasty.   The patient history, physical examination, clinical judgment of the provider and imaging studies are consistent with loosening of the left knee, previous total knee arthroplasty. Revision total knee arthroplasty is deemed medically necessary. The treatment options including medical management, injection therapy, arthroscopy and revision arthroplasty were discussed at length. The risks and benefits of revision total knee arthroplasty were presented and reviewed. The risks due to aseptic loosening, infection, stiffness, patella tracking problems, thromboembolic complications and other imponderables were discussed. The patient acknowledged the explanation, agreed to proceed with the plan and consent was signed. Patient is being admitted for inpatient treatment for surgery, pain control, PT, OT, prophylactic antibiotics, VTE prophylaxis, progressive ambulation and ADL's and discharge planning.The patient is planning to be discharged home with home health services.     West Pugh Cena Bruhn   PA-C  03/27/2015, 1:46 PM

## 2015-03-29 ENCOUNTER — Encounter (HOSPITAL_COMMUNITY): Payer: Self-pay

## 2015-03-29 ENCOUNTER — Encounter (HOSPITAL_COMMUNITY)
Admission: RE | Admit: 2015-03-29 | Discharge: 2015-03-29 | Disposition: A | Discharge status: Home / Self Care | Payer: Medicare Other | Source: Ambulatory Visit | Attending: Orthopedic Surgery | Admitting: Orthopedic Surgery

## 2015-03-29 DIAGNOSIS — Z01818 Encounter for other preprocedural examination: Secondary | ICD-10-CM | POA: Diagnosis not present

## 2015-03-29 HISTORY — DX: Anemia, unspecified: D64.9

## 2015-03-29 HISTORY — DX: Essential (primary) hypertension: I10

## 2015-03-29 HISTORY — DX: Gastro-esophageal reflux disease without esophagitis: K21.9

## 2015-03-29 HISTORY — DX: Pneumonia, unspecified organism: J18.9

## 2015-03-29 LAB — URINALYSIS, ROUTINE W REFLEX MICROSCOPIC
Bilirubin Urine: NEGATIVE
Glucose, UA: NEGATIVE mg/dL
Hgb urine dipstick: NEGATIVE
Ketones, ur: NEGATIVE mg/dL
Nitrite: NEGATIVE
Protein, ur: NEGATIVE mg/dL
Specific Gravity, Urine: 1.018 (ref 1.005–1.030)
Urobilinogen, UA: 0.2 mg/dL (ref 0.0–1.0)
pH: 5 (ref 5.0–8.0)

## 2015-03-29 LAB — APTT: aPTT: 28 seconds (ref 24–37)

## 2015-03-29 LAB — ABO/RH: ABO/RH(D): O POS

## 2015-03-29 LAB — URINE MICROSCOPIC-ADD ON

## 2015-03-29 LAB — SURGICAL PCR SCREEN
MRSA, PCR: NEGATIVE
Staphylococcus aureus: POSITIVE — AB

## 2015-03-29 LAB — PROTIME-INR
INR: 1.11 (ref 0.00–1.49)
Prothrombin Time: 14.5 seconds (ref 11.6–15.2)

## 2015-03-29 NOTE — Progress Notes (Signed)
03-29-15 - Pt. Has appointment in Radiology on Friday, September 30th for a PICC line placement.  Verified by [facility] in Radiology.

## 2015-03-29 NOTE — Progress Notes (Signed)
03-21-15 - CMP - EPIC 03-20-14 - CBC w/diff - in chart & EPIC 03-21-15 - LOV Dr. Marin Olp (onc) - EPIC 01-16-15 - Bone Scan - EPIC

## 2015-03-29 NOTE — Progress Notes (Signed)
03-29-15 -UA and MICRO, PCR screen results from preop visit on 03-29-15 faxed to Dr. Alvan Dame via [facility]

## 2015-04-05 ENCOUNTER — Other Ambulatory Visit: Payer: Self-pay | Admitting: Orthopedic Surgery

## 2015-04-05 ENCOUNTER — Ambulatory Visit (HOSPITAL_COMMUNITY)
Admission: RE | Admit: 2015-04-05 | Discharge: 2015-04-05 | Disposition: A | Discharge status: Home / Self Care | Payer: Medicare Other | Source: Ambulatory Visit | Attending: Orthopedic Surgery | Admitting: Orthopedic Surgery

## 2015-04-05 DIAGNOSIS — M1712 Unilateral primary osteoarthritis, left knee: Secondary | ICD-10-CM

## 2015-04-05 DIAGNOSIS — Z452 Encounter for adjustment and management of vascular access device: Secondary | ICD-10-CM | POA: Diagnosis not present

## 2015-04-05 MED ORDER — HEPARIN SOD (PORK) LOCK FLUSH 100 UNIT/ML IV SOLN
INTRAVENOUS | Status: AC
  Administered 2015-04-05: 500 [IU]
  Filled 2015-04-05: qty 5

## 2015-04-05 MED ORDER — LIDOCAINE HCL 1 % IJ SOLN
INTRAMUSCULAR | Status: AC
  Filled 2015-04-05: qty 20

## 2015-04-05 MED ORDER — HEPARIN SOD (PORK) LOCK FLUSH 10 UNIT/ML IV SOLN: 10.0000 [IU] | Freq: Once | INTRAVENOUS | Status: DC

## 2015-04-05 NOTE — Procedures (Signed)
Interventional Radiology Procedure Note  Procedure: Placement of a right  Basilic vein approach double lumen PICC.  Tip is positioned at the superior cavoatrial junction and catheter is ready for immediate use.  18EX length Complications: None Recommendations:  - Ok to shower tomorrow - Do not submerge  - Routine line care   Signed,  Dulcy Fanny. Earleen Newport, DO

## 2015-04-08 ENCOUNTER — Inpatient Hospital Stay (HOSPITAL_COMMUNITY)
Admission: RE | Admit: 2015-04-08 | Discharge: 2015-04-10 | DRG: 467 | Disposition: A | Discharge status: Home Health | Payer: Medicare Other | Source: Ambulatory Visit | Attending: Orthopedic Surgery | Admitting: Orthopedic Surgery

## 2015-04-08 ENCOUNTER — Inpatient Hospital Stay (HOSPITAL_COMMUNITY): Payer: Medicare Other | Admitting: Registered Nurse

## 2015-04-08 ENCOUNTER — Encounter (HOSPITAL_COMMUNITY): Payer: Self-pay | Admitting: *Deleted

## 2015-04-08 ENCOUNTER — Encounter (HOSPITAL_COMMUNITY)
Admission: RE | Disposition: A | Discharge status: Home Health | Payer: Self-pay | Source: Ambulatory Visit | Attending: Orthopedic Surgery

## 2015-04-08 DIAGNOSIS — Y792 Prosthetic and other implants, materials and accessory orthopedic devices associated with adverse incidents: Secondary | ICD-10-CM | POA: Diagnosis present

## 2015-04-08 DIAGNOSIS — I10 Essential (primary) hypertension: Secondary | ICD-10-CM | POA: Diagnosis present

## 2015-04-08 DIAGNOSIS — Z8572 Personal history of non-Hodgkin lymphomas: Secondary | ICD-10-CM

## 2015-04-08 DIAGNOSIS — Z96652 Presence of left artificial knee joint: Secondary | ICD-10-CM

## 2015-04-08 DIAGNOSIS — Z01812 Encounter for preprocedural laboratory examination: Secondary | ICD-10-CM

## 2015-04-08 DIAGNOSIS — Z79899 Other long term (current) drug therapy: Secondary | ICD-10-CM | POA: Diagnosis not present

## 2015-04-08 DIAGNOSIS — M25562 Pain in left knee: Secondary | ICD-10-CM | POA: Diagnosis not present

## 2015-04-08 DIAGNOSIS — T8489XA Other specified complication of internal orthopedic prosthetic devices, implants and grafts, initial encounter: Secondary | ICD-10-CM | POA: Diagnosis not present

## 2015-04-08 DIAGNOSIS — T84033A Mechanical loosening of internal left knee prosthetic joint, initial encounter: Principal | ICD-10-CM | POA: Diagnosis present

## 2015-04-08 DIAGNOSIS — Z6841 Body Mass Index (BMI) 40.0 and over, adult: Secondary | ICD-10-CM

## 2015-04-08 DIAGNOSIS — Z96659 Presence of unspecified artificial knee joint: Secondary | ICD-10-CM

## 2015-04-08 DIAGNOSIS — Z96642 Presence of left artificial hip joint: Secondary | ICD-10-CM | POA: Diagnosis not present

## 2015-04-08 HISTORY — PX: TOTAL KNEE REVISION: SHX996

## 2015-04-08 LAB — TYPE AND SCREEN
ABO/RH(D): O POS
Antibody Screen: NEGATIVE

## 2015-04-08 SURGERY — TOTAL KNEE REVISION
Anesthesia: Spinal | Site: Knee | Laterality: Left

## 2015-04-08 MED ORDER — ONDANSETRON HCL 4 MG/2ML IJ SOLN
INTRAMUSCULAR | Status: DC | PRN
  Administered 2015-04-08: 4 mg via INTRAVENOUS

## 2015-04-08 MED ORDER — BUPIVACAINE-EPINEPHRINE 0.25% -1:200000 IJ SOLN
INTRAMUSCULAR | Status: DC | PRN
  Administered 2015-04-08: 30 mL

## 2015-04-08 MED ORDER — FENTANYL CITRATE (PF) 100 MCG/2ML IJ SOLN
INTRAMUSCULAR | Status: AC
  Filled 2015-04-08: qty 2

## 2015-04-08 MED ORDER — SODIUM CHLORIDE 0.9 % IR SOLN
Status: DC | PRN
  Administered 2015-04-08: 1000 mL

## 2015-04-08 MED ORDER — LACTATED RINGERS IV SOLN: INTRAVENOUS | Status: DC

## 2015-04-08 MED ORDER — ESTRADIOL 1 MG PO TABS
1.0000 mg | ORAL_TABLET | Freq: Every day | ORAL | Status: DC
  Administered 2015-04-08 – 2015-04-10 (×3): 1 mg via ORAL
  Filled 2015-04-08 (×3): qty 1

## 2015-04-08 MED ORDER — METHOCARBAMOL 1000 MG/10ML IJ SOLN
500.0000 mg | Freq: Four times a day (QID) | INTRAVENOUS | Status: DC | PRN
  Administered 2015-04-08: 500 mg via INTRAVENOUS
  Filled 2015-04-08 (×2): qty 5

## 2015-04-08 MED ORDER — PANTOPRAZOLE SODIUM 40 MG PO TBEC
40.0000 mg | DELAYED_RELEASE_TABLET | Freq: Two times a day (BID) | ORAL | Status: DC
  Administered 2015-04-08 – 2015-04-10 (×4): 40 mg via ORAL
  Filled 2015-04-08 (×5): qty 1

## 2015-04-08 MED ORDER — BUPIVACAINE IN DEXTROSE 0.75-8.25 % IT SOLN
INTRATHECAL | Status: DC | PRN
  Administered 2015-04-08: 1.6 mL via INTRATHECAL

## 2015-04-08 MED ORDER — MIDAZOLAM HCL 5 MG/5ML IJ SOLN
INTRAMUSCULAR | Status: DC | PRN
  Administered 2015-04-08: 2 mg via INTRAVENOUS

## 2015-04-08 MED ORDER — PHENYLEPHRINE 40 MCG/ML (10ML) SYRINGE FOR IV PUSH (FOR BLOOD PRESSURE SUPPORT)
PREFILLED_SYRINGE | INTRAVENOUS | Status: AC
  Filled 2015-04-08: qty 10

## 2015-04-08 MED ORDER — CEFAZOLIN SODIUM-DEXTROSE 2-3 GM-% IV SOLR
2.0000 g | Freq: Four times a day (QID) | INTRAVENOUS | Status: AC
  Administered 2015-04-08 (×2): 2 g via INTRAVENOUS
  Filled 2015-04-08 (×2): qty 50

## 2015-04-08 MED ORDER — MEPERIDINE HCL 50 MG/ML IJ SOLN: 6.2500 mg | INTRAMUSCULAR | Status: DC | PRN

## 2015-04-08 MED ORDER — LABETALOL HCL 5 MG/ML IV SOLN
INTRAVENOUS | Status: AC
  Filled 2015-04-08: qty 4

## 2015-04-08 MED ORDER — ONDANSETRON HCL 4 MG/2ML IJ SOLN
4.0000 mg | Freq: Four times a day (QID) | INTRAMUSCULAR | Status: DC | PRN
  Administered 2015-04-09 (×2): 4 mg via INTRAVENOUS
  Filled 2015-04-08 (×3): qty 2

## 2015-04-08 MED ORDER — TRANEXAMIC ACID 1000 MG/10ML IV SOLN
1000.0000 mg | Freq: Once | INTRAVENOUS | Status: AC
  Administered 2015-04-08: 1000 mg via INTRAVENOUS
  Filled 2015-04-08: qty 10

## 2015-04-08 MED ORDER — HYDROCODONE-ACETAMINOPHEN 7.5-325 MG PO TABS
1.0000 | ORAL_TABLET | ORAL | Status: DC
  Administered 2015-04-08 (×2): 2 via ORAL
  Administered 2015-04-08: 1 via ORAL
  Administered 2015-04-09: 2 via ORAL
  Administered 2015-04-09: 1 via ORAL
  Administered 2015-04-09 – 2015-04-10 (×7): 2 via ORAL
  Filled 2015-04-08: qty 2
  Filled 2015-04-08 (×2): qty 1
  Filled 2015-04-08 (×9): qty 2

## 2015-04-08 MED ORDER — SODIUM CHLORIDE 0.9 % IV SOLN
INTRAVENOUS | Status: DC
  Administered 2015-04-08 – 2015-04-09 (×4): via INTRAVENOUS
  Filled 2015-04-08 (×8): qty 1000

## 2015-04-08 MED ORDER — LIDOCAINE HCL (CARDIAC) 20 MG/ML IV SOLN
INTRAVENOUS | Status: DC | PRN
  Administered 2015-04-08: 100 mg via INTRAVENOUS

## 2015-04-08 MED ORDER — CHLORHEXIDINE GLUCONATE 4 % EX LIQD: 60.0000 mL | Freq: Once | CUTANEOUS | Status: DC

## 2015-04-08 MED ORDER — LABETALOL HCL 5 MG/ML IV SOLN
10.0000 mg | INTRAVENOUS | Status: DC | PRN
  Administered 2015-04-08: 5 mg via INTRAVENOUS

## 2015-04-08 MED ORDER — KETOROLAC TROMETHAMINE 30 MG/ML IJ SOLN
INTRAMUSCULAR | Status: AC
  Filled 2015-04-08: qty 1

## 2015-04-08 MED ORDER — PROPOFOL 10 MG/ML IV BOLUS
INTRAVENOUS | Status: AC
  Filled 2015-04-08: qty 20

## 2015-04-08 MED ORDER — MENTHOL 3 MG MT LOZG: 1.0000 | LOZENGE | OROMUCOSAL | Status: DC | PRN

## 2015-04-08 MED ORDER — PREDNISONE 5 MG PO TABS
5.0000 mg | ORAL_TABLET | Freq: Every day | ORAL | Status: DC
  Administered 2015-04-09 – 2015-04-10 (×2): 5 mg via ORAL
  Filled 2015-04-08 (×3): qty 1

## 2015-04-08 MED ORDER — METHOCARBAMOL 500 MG PO TABS
500.0000 mg | ORAL_TABLET | Freq: Four times a day (QID) | ORAL | Status: DC | PRN
  Administered 2015-04-08 – 2015-04-10 (×5): 500 mg via ORAL
  Filled 2015-04-08 (×5): qty 1

## 2015-04-08 MED ORDER — DIPHENHYDRAMINE HCL 25 MG PO CAPS
25.0000 mg | ORAL_CAPSULE | Freq: Four times a day (QID) | ORAL | Status: DC | PRN
  Administered 2015-04-08: 25 mg via ORAL
  Filled 2015-04-08: qty 1

## 2015-04-08 MED ORDER — PROPOFOL 500 MG/50ML IV EMUL
INTRAVENOUS | Status: DC | PRN
  Administered 2015-04-08: 25 ug/kg/min via INTRAVENOUS

## 2015-04-08 MED ORDER — FENTANYL CITRATE (PF) 100 MCG/2ML IJ SOLN
INTRAMUSCULAR | Status: AC
  Filled 2015-04-08: qty 4

## 2015-04-08 MED ORDER — ALUM & MAG HYDROXIDE-SIMETH 200-200-20 MG/5ML PO SUSP: 30.0000 mL | ORAL | Status: DC | PRN

## 2015-04-08 MED ORDER — PHENYLEPHRINE HCL 10 MG/ML IJ SOLN
INTRAMUSCULAR | Status: DC | PRN
  Administered 2015-04-08 (×2): 60 ug via INTRAVENOUS
  Administered 2015-04-08: 40 ug via INTRAVENOUS

## 2015-04-08 MED ORDER — KETOROLAC TROMETHAMINE 30 MG/ML IJ SOLN
INTRAMUSCULAR | Status: DC | PRN
  Administered 2015-04-08: 30 mg

## 2015-04-08 MED ORDER — METOCLOPRAMIDE HCL 10 MG PO TABS: 5.0000 mg | ORAL_TABLET | Freq: Three times a day (TID) | ORAL | Status: DC | PRN

## 2015-04-08 MED ORDER — HYDROMORPHONE HCL 1 MG/ML IJ SOLN
0.5000 mg | INTRAMUSCULAR | Status: DC | PRN
  Administered 2015-04-08 – 2015-04-09 (×4): 0.5 mg via INTRAVENOUS
  Filled 2015-04-08 (×4): qty 1

## 2015-04-08 MED ORDER — FERROUS SULFATE 325 (65 FE) MG PO TABS
325.0000 mg | ORAL_TABLET | Freq: Three times a day (TID) | ORAL | Status: DC
  Administered 2015-04-08 – 2015-04-10 (×5): 325 mg via ORAL
  Filled 2015-04-08 (×9): qty 1

## 2015-04-08 MED ORDER — IRBESARTAN 150 MG PO TABS
150.0000 mg | ORAL_TABLET | Freq: Every day | ORAL | Status: DC
  Administered 2015-04-09 – 2015-04-10 (×2): 150 mg via ORAL
  Filled 2015-04-08 (×3): qty 1

## 2015-04-08 MED ORDER — PROMETHAZINE HCL 25 MG/ML IJ SOLN: 6.2500 mg | INTRAMUSCULAR | Status: DC | PRN

## 2015-04-08 MED ORDER — LACTATED RINGERS IV SOLN
INTRAVENOUS | Status: DC | PRN
  Administered 2015-04-08: 07:00:00 via INTRAVENOUS

## 2015-04-08 MED ORDER — CELECOXIB 200 MG PO CAPS
200.0000 mg | ORAL_CAPSULE | Freq: Two times a day (BID) | ORAL | Status: DC
  Administered 2015-04-08 – 2015-04-10 (×4): 200 mg via ORAL
  Filled 2015-04-08 (×5): qty 1

## 2015-04-08 MED ORDER — SODIUM CHLORIDE 0.9 % IJ SOLN
10.0000 mL | INTRAMUSCULAR | Status: DC | PRN
Start: 2015-04-08 — End: 2015-04-10
  Administered 2015-04-09 – 2015-04-10 (×3): 10 mL
  Filled 2015-04-08 (×3): qty 40

## 2015-04-08 MED ORDER — POLYETHYLENE GLYCOL 3350 17 G PO PACK
17.0000 g | PACK | Freq: Two times a day (BID) | ORAL | Status: DC
  Administered 2015-04-08 – 2015-04-10 (×4): 17 g via ORAL

## 2015-04-08 MED ORDER — METOCLOPRAMIDE HCL 5 MG/ML IJ SOLN
5.0000 mg | Freq: Three times a day (TID) | INTRAMUSCULAR | Status: DC | PRN
  Administered 2015-04-09: 5 mg via INTRAVENOUS
  Filled 2015-04-08: qty 2

## 2015-04-08 MED ORDER — ONDANSETRON HCL 4 MG PO TABS
4.0000 mg | ORAL_TABLET | Freq: Four times a day (QID) | ORAL | Status: DC | PRN
  Administered 2015-04-10: 4 mg via ORAL
  Filled 2015-04-08: qty 1

## 2015-04-08 MED ORDER — HYDROCHLOROTHIAZIDE 25 MG PO TABS
25.0000 mg | ORAL_TABLET | Freq: Every day | ORAL | Status: DC
  Administered 2015-04-09 – 2015-04-10 (×2): 25 mg via ORAL
  Filled 2015-04-08 (×3): qty 1

## 2015-04-08 MED ORDER — ASPIRIN EC 325 MG PO TBEC
325.0000 mg | DELAYED_RELEASE_TABLET | Freq: Two times a day (BID) | ORAL | Status: DC
  Administered 2015-04-09 – 2015-04-10 (×3): 325 mg via ORAL
  Filled 2015-04-08 (×5): qty 1

## 2015-04-08 MED ORDER — DEXAMETHASONE SODIUM PHOSPHATE 10 MG/ML IJ SOLN
10.0000 mg | Freq: Once | INTRAMUSCULAR | Status: AC
  Administered 2015-04-09: 10 mg via INTRAVENOUS
  Filled 2015-04-08: qty 1

## 2015-04-08 MED ORDER — EPHEDRINE SULFATE 50 MG/ML IJ SOLN
INTRAMUSCULAR | Status: AC
  Filled 2015-04-08: qty 1

## 2015-04-08 MED ORDER — DOCUSATE SODIUM 100 MG PO CAPS
100.0000 mg | ORAL_CAPSULE | Freq: Two times a day (BID) | ORAL | Status: DC
  Administered 2015-04-08 – 2015-04-10 (×4): 100 mg via ORAL

## 2015-04-08 MED ORDER — SODIUM CHLORIDE 0.9 % IJ SOLN
INTRAMUSCULAR | Status: DC | PRN
  Administered 2015-04-08: 30 mL

## 2015-04-08 MED ORDER — PHENOL 1.4 % MT LIQD
1.0000 | OROMUCOSAL | Status: DC | PRN
  Filled 2015-04-08: qty 177

## 2015-04-08 MED ORDER — FENTANYL CITRATE (PF) 100 MCG/2ML IJ SOLN
25.0000 ug | INTRAMUSCULAR | Status: DC | PRN
  Administered 2015-04-08 (×4): 50 ug via INTRAVENOUS

## 2015-04-08 MED ORDER — MAGNESIUM CITRATE PO SOLN: 1.0000 | Freq: Once | ORAL | Status: DC | PRN

## 2015-04-08 MED ORDER — CEFAZOLIN SODIUM-DEXTROSE 2-3 GM-% IV SOLR
INTRAVENOUS | Status: AC
  Filled 2015-04-08: qty 50

## 2015-04-08 MED ORDER — BUPIVACAINE-EPINEPHRINE (PF) 0.25% -1:200000 IJ SOLN
INTRAMUSCULAR | Status: AC
  Filled 2015-04-08: qty 30

## 2015-04-08 MED ORDER — DEXAMETHASONE SODIUM PHOSPHATE 10 MG/ML IJ SOLN
10.0000 mg | Freq: Once | INTRAMUSCULAR | Status: AC
  Administered 2015-04-08: 10 mg via INTRAVENOUS

## 2015-04-08 MED ORDER — BISACODYL 10 MG RE SUPP: 10.0000 mg | Freq: Every day | RECTAL | Status: DC | PRN

## 2015-04-08 MED ORDER — LIDOCAINE HCL (CARDIAC) 20 MG/ML IV SOLN
INTRAVENOUS | Status: AC
  Filled 2015-04-08: qty 5

## 2015-04-08 MED ORDER — MIDAZOLAM HCL 2 MG/2ML IJ SOLN
INTRAMUSCULAR | Status: AC
  Filled 2015-04-08: qty 4

## 2015-04-08 MED ORDER — CEFAZOLIN SODIUM-DEXTROSE 2-3 GM-% IV SOLR
2.0000 g | INTRAVENOUS | Status: AC
  Administered 2015-04-08: 2 g via INTRAVENOUS

## 2015-04-08 MED ORDER — SODIUM CHLORIDE 0.9 % IJ SOLN
INTRAMUSCULAR | Status: AC
  Filled 2015-04-08: qty 10

## 2015-04-08 MED ORDER — SODIUM CHLORIDE 0.9 % IJ SOLN
INTRAMUSCULAR | Status: AC
  Filled 2015-04-08: qty 50

## 2015-04-08 MED ORDER — VALSARTAN-HYDROCHLOROTHIAZIDE 160-25 MG PO TABS: 1.0000 | ORAL_TABLET | Freq: Every day | ORAL | Status: DC

## 2015-04-08 MED ORDER — TRIAMCINOLONE ACETONIDE 0.1 % EX CREA
1.0000 "application " | TOPICAL_CREAM | Freq: Every day | CUTANEOUS | Status: DC | PRN
  Filled 2015-04-08: qty 15

## 2015-04-08 MED ORDER — ONDANSETRON HCL 4 MG/2ML IJ SOLN
INTRAMUSCULAR | Status: AC
  Filled 2015-04-08: qty 2

## 2015-04-08 SURGICAL SUPPLY — 90 items
ADAPTER BOLT FEMORAL +2/-2 (Knees) ×1 IMPLANT
ADPR FEM +2/-2 OFST BOLT (Knees) ×1 IMPLANT
ADPR FEM 5D STRL KN PFC SGM (Orthopedic Implant) ×1 IMPLANT
AUG FEM SZ3 4 CMB POST STRL LF (Knees) ×2 IMPLANT
AUG FEM SZ3 8 DIST STRL KN LT (Knees) ×1 IMPLANT
AUG TIB SZ2 5 REV STP WDG STRL (Knees) ×2 IMPLANT
AUGMENT DIST PFC 8MM (Knees) IMPLANT
BAG DECANTER FOR FLEXI CONT (MISCELLANEOUS) IMPLANT
BAG SPEC THK2 15X12 ZIP CLS (MISCELLANEOUS)
BAG ZIPLOCK 12X15 (MISCELLANEOUS) IMPLANT
BANDAGE ELASTIC 6 VELCRO ST LF (GAUZE/BANDAGES/DRESSINGS) ×2 IMPLANT
BANDAGE ESMARK 6X9 LF (GAUZE/BANDAGES/DRESSINGS) ×1 IMPLANT
BLADE SAW SGTL 13.0X1.19X90.0M (BLADE) ×2 IMPLANT
BLADE SAW SGTL 81X20 HD (BLADE) ×2 IMPLANT
BNDG CMPR 9X6 STRL LF SNTH (GAUZE/BANDAGES/DRESSINGS) ×1
BNDG ESMARK 6X9 LF (GAUZE/BANDAGES/DRESSINGS) ×2
BONE CEMENT GENTAMICIN (Cement) ×6 IMPLANT
BRUSH FEMORAL CANAL (MISCELLANEOUS) IMPLANT
CEMENT BONE GENTAMICIN 40 (Cement) IMPLANT
CEMENT RESTRICTOR DEPUY SZ 4 (Cement) ×1 IMPLANT
CUFF TOURN SGL QUICK 34 (TOURNIQUET CUFF) ×2
CUFF TRNQT CYL 34X4X40X1 (TOURNIQUET CUFF) ×1 IMPLANT
DISAL AUG PFC 8MM (Knees) ×2 IMPLANT
DRAPE EXTREMITY T 121X128X90 (DRAPE) ×2 IMPLANT
DRAPE POUCH INSTRU U-SHP 10X18 (DRAPES) ×2 IMPLANT
DRAPE U-SHAPE 47X51 STRL (DRAPES) ×2 IMPLANT
DRSG ADAPTIC 3X8 NADH LF (GAUZE/BANDAGES/DRESSINGS) IMPLANT
DRSG AQUACEL AG ADV 3.5X10 (GAUZE/BANDAGES/DRESSINGS) ×2 IMPLANT
DRSG PAD ABDOMINAL 8X10 ST (GAUZE/BANDAGES/DRESSINGS) IMPLANT
DRSG TEGADERM 4X4.75 (GAUZE/BANDAGES/DRESSINGS) IMPLANT
DURAPREP 26ML APPLICATOR (WOUND CARE) ×4 IMPLANT
ELECT REM PT RETURN 9FT ADLT (ELECTROSURGICAL) ×2
ELECTRODE REM PT RTRN 9FT ADLT (ELECTROSURGICAL) ×1 IMPLANT
FACESHIELD WRAPAROUND (MASK) ×10 IMPLANT
FACESHIELD WRAPAROUND OR TEAM (MASK) ×5 IMPLANT
FEM TC3 PFC SZ3 LEFT (Orthopedic Implant) ×2 IMPLANT
FEMORAL ADAPTER (Orthopedic Implant) ×1 IMPLANT
FEMORAL TC3 PFC SZ3 LEFT (Orthopedic Implant) IMPLANT
GAUZE SPONGE 2X2 8PLY STRL LF (GAUZE/BANDAGES/DRESSINGS) IMPLANT
GAUZE SPONGE 4X4 12PLY STRL (GAUZE/BANDAGES/DRESSINGS) IMPLANT
GLOVE BIOGEL PI IND STRL 7.5 (GLOVE) ×1 IMPLANT
GLOVE BIOGEL PI IND STRL 8.5 (GLOVE) ×1 IMPLANT
GLOVE BIOGEL PI INDICATOR 7.5 (GLOVE) ×8
GLOVE BIOGEL PI INDICATOR 8.5 (GLOVE) ×1
GLOVE ECLIPSE 8.0 STRL XLNG CF (GLOVE) ×2 IMPLANT
GLOVE ORTHO TXT STRL SZ7.5 (GLOVE) ×4 IMPLANT
GOWN SPEC L3 XXLG W/TWL (GOWN DISPOSABLE) ×2 IMPLANT
GOWN STRL REUS W/TWL LRG LVL3 (GOWN DISPOSABLE) ×4 IMPLANT
HANDPIECE INTERPULSE COAX TIP (DISPOSABLE) ×2
IMMOBILIZER KNEE 20 (SOFTGOODS) ×2
IMMOBILIZER KNEE 20 THIGH 36 (SOFTGOODS) IMPLANT
INSERT TIB TC3 SZ 3 10 (Knees) ×1 IMPLANT
KIT BASIN OR (CUSTOM PROCEDURE TRAY) ×2 IMPLANT
LIQUID BAND (GAUZE/BANDAGES/DRESSINGS) ×2 IMPLANT
MANIFOLD NEPTUNE II (INSTRUMENTS) ×2 IMPLANT
NDL SAFETY ECLIPSE 18X1.5 (NEEDLE) ×1 IMPLANT
NEEDLE HYPO 18GX1.5 SHARP (NEEDLE) ×6
NS IRRIG 1000ML POUR BTL (IV SOLUTION) ×2 IMPLANT
PACK TOTAL JOINT (CUSTOM PROCEDURE TRAY) ×2 IMPLANT
PADDING CAST COTTON 6X4 STRL (CAST SUPPLIES) ×1 IMPLANT
PEN SKIN MARKING BROAD (MISCELLANEOUS) ×2 IMPLANT
POSITIONER SURGICAL ARM (MISCELLANEOUS) ×2 IMPLANT
POST AVE PFC 4MM (Knees) ×2 IMPLANT
RESTRICTOR CEMENT SZ 5 C-STEM (Cement) ×1 IMPLANT
SET HNDPC FAN SPRY TIP SCT (DISPOSABLE) ×1 IMPLANT
SET PAD KNEE POSITIONER (MISCELLANEOUS) ×2 IMPLANT
SLEEVE UNIV FEM DIST PRO SZ 31 (Sleeve) ×1 IMPLANT
SPONGE GAUZE 2X2 STER 10/PKG (GAUZE/BANDAGES/DRESSINGS)
SPONGE LAP 18X18 X RAY DECT (DISPOSABLE) IMPLANT
STAPLER VISISTAT 35W (STAPLE) IMPLANT
STEM TIBIA PFC 13X30MM (Stem) ×2 IMPLANT
SUCTION FRAZIER 12FR DISP (SUCTIONS) ×2 IMPLANT
SUT MNCRL AB 3-0 PS2 18 (SUTURE) ×2 IMPLANT
SUT VIC AB 1 CT1 36 (SUTURE) ×2 IMPLANT
SUT VIC AB 2-0 CT1 27 (SUTURE) ×6
SUT VIC AB 2-0 CT1 TAPERPNT 27 (SUTURE) ×3 IMPLANT
SUT VLOC 180 0 24IN GS25 (SUTURE) ×2 IMPLANT
SYR 50ML LL SCALE MARK (SYRINGE) ×2 IMPLANT
TOWEL OR 17X26 10 PK STRL BLUE (TOWEL DISPOSABLE) ×2 IMPLANT
TOWEL OR NON WOVEN STRL DISP B (DISPOSABLE) ×2 IMPLANT
TOWER CARTRIDGE SMART MIX (DISPOSABLE) ×2 IMPLANT
TRAY FOLEY W/METER SILVER 14FR (SET/KITS/TRAYS/PACK) ×2 IMPLANT
TRAY FOLEY W/METER SILVER 16FR (SET/KITS/TRAYS/PACK) ×1 IMPLANT
TRAY SLEEVE CEM ML (Knees) ×1 IMPLANT
TRAY TIB SZ 2 REVISION (Knees) ×1 IMPLANT
TUBE KAMVAC SUCTION (TUBING) IMPLANT
WATER STERILE IRR 1500ML POUR (IV SOLUTION) ×2 IMPLANT
WEDGE SZ 2.0MM 5MM (Knees) ×2 IMPLANT
WRAP KNEE MAXI GEL POST OP (GAUZE/BANDAGES/DRESSINGS) ×2 IMPLANT
YANKAUER SUCT BULB TIP 10FT TU (MISCELLANEOUS) ×2 IMPLANT

## 2015-04-08 NOTE — Evaluation (Signed)
Physical Therapy Evaluation Patient Details Name: Helen Hardy MRN: [id number] DOB: Apr 21, 1951 Today's Date: 04/08/2015   History of Present Illness  Revision L TKA, h/o RA  Clinical Impression  Patient tolerated transfer to recliner. Able to use RW with RA. Patient will bebefit from PT to address problems listed in note below.    Follow Up Recommendations Home health PT;Supervision/Assistance - 24 hour    Equipment Recommendations  Rolling walker with 5" wheels;3in1 (PT)    Recommendations for Other Services       Precautions / Restrictions Precautions Precautions: Knee Precaution Comments: use sneakers      Mobility  Bed Mobility Overal bed mobility: Needs Assistance Bed Mobility: Supine to Sit     Supine to sit: Mod assist;HOB elevated     General bed mobility comments: assist with Legs and trunk   Transfers Overall transfer level: Needs assistance Equipment used: Rolling walker (2 wheeled) Transfers: Stand Pivot Transfers;Sit to/from Stand Sit to Stand: Mod assist;+2 physical assistance;+2 safety/equipment;From elevated surface Stand pivot transfers: Mod assist;+2 physical assistance;+2 safety/equipment       General transfer comment: cues for hand and L leg position, only able to  take small pivot steps, c/o R knee buckling  Ambulation/Gait                Stairs            Wheelchair Mobility    Modified Rankin (Stroke Patients Only)       Balance Overall balance assessment: Needs assistance Sitting-balance support: Feet supported;Bilateral upper extremity supported Sitting balance-Leahy Scale: Good     Standing balance support: During functional activity;Bilateral upper extremity supported Standing balance-Leahy Scale: Poor                               Pertinent Vitals/Pain Pain Assessment: 0-10 Pain Score: 6  Pain Location: L knee Pain Descriptors / Indicators: Aching;Spasm Pain Intervention(s): Limited  activity within patient's tolerance;Premedicated before session;Repositioned;Monitored during session;Ice applied    Home Living Family/patient expects to be discharged to:: Private residence Living Arrangements: Spouse/significant other Available Help at Discharge: Family Type of Home: House Home Access: Stairs to enter   Technical brewer of Steps: 1 Home Layout: One level Home Equipment: None      Prior Function Level of Independence: Independent               Hand Dominance        Extremity/Trunk Assessment               Lower Extremity Assessment: LLE deficits/detail   LLE Deficits / Details: Able to raise  Leg from bed with a lag. , knee flexion 45 degrees.  Cervical / Trunk Assessment: Normal  Communication   Communication: No difficulties  Cognition Arousal/Alertness: Awake/alert Behavior During Therapy: WFL for tasks assessed/performed Overall Cognitive Status: Within Functional Limits for tasks assessed                      General Comments      Exercises        Assessment/Plan    PT Assessment Patient needs continued PT services  PT Diagnosis Difficulty walking;Acute pain   PT Problem List Decreased strength;Decreased range of motion;Decreased activity tolerance;Decreased mobility;Decreased knowledge of use of DME;Decreased safety awareness;Decreased knowledge of precautions;Obesity;Pain  PT Treatment Interventions DME instruction;Gait training;Stair training;Functional mobility training;Therapeutic activities;Therapeutic exercise;Patient/family education   PT Goals (Current goals can  be found in the Care Plan section) Acute Rehab PT Goals Patient Stated Goal: to walk , go home PT Goal Formulation: With patient/family Time For Goal Achievement: 04/12/15 Potential to Achieve Goals: Good    Frequency 7X/week   Barriers to discharge        Co-evaluation               End of Session Equipment Utilized During  Treatment: Gait belt Activity Tolerance: Patient tolerated treatment well Patient left: in chair;with call bell/phone within reach;with family/visitor present Nurse Communication: Mobility status         Time: 8457-3344 PT Time Calculation (min) (ACUTE ONLY): 28 min   Charges:   PT Evaluation $Initial PT Evaluation Tier I: 1 Procedure PT Treatments $Therapeutic Activity: 8-22 mins   PT G Codes:        Claretha Cooper 04/08/2015, 6:20 PM Tresa Endo PT [phone]

## 2015-04-08 NOTE — Interval H&P Note (Signed)
History and Physical Interval Note:  04/08/2015 6:48 AM  Helen Hardy  has presented today for surgery, with the diagnosis of aseptic failure left total knee   The various methods of treatment have been discussed with the patient and family. After consideration of risks, benefits and other options for treatment, the patient has consented to  Procedure(s): REVISION LEFT TOTAL KNEE  (Left) as a surgical intervention .  The patient's history has been reviewed, patient examined, no change in status, stable for surgery.  I have reviewed the patient's chart and labs.  Questions were answered to the patient's satisfaction.     [name]

## 2015-04-08 NOTE — Plan of Care (Signed)
Problem: Consults Goal: Diagnosis- Total Joint Replacement revuision right total knee

## 2015-04-08 NOTE — Anesthesia Procedure Notes (Addendum)
Spinal Patient location during procedure: OR Start time: 04/08/2015 7:28 AM End time: 04/08/2015 7:33 AM Staffing Resident/CRNA: Dylon Correa A Performed by: resident/CRNA  Preanesthetic Checklist Completed: patient identified, site marked, surgical consent, pre-op evaluation, timeout performed, IV checked, risks and benefits discussed and monitors and equipment checked Spinal Block Patient position: sitting Prep: Betadine and site prepped and draped Patient monitoring: heart rate Approach: midline Location: L3-4 Injection technique: single-shot Needle Needle type: Sprotte  Needle gauge: 24 G Needle length: 10 cm Assessment Sensory level: T4 Additional Notes Pt placed in sitting position for spinal. Pt tolerated well. One attempt. + CSF, - heme. Spinal expiration kit date 2016-09-02  Procedure Name: MAC Date/Time: 04/08/2015 7:18 AM Performed by: Carleene Cooper A Pre-anesthesia Checklist: Patient identified, Emergency Drugs available, Suction available, Patient being monitored and Timeout performed Patient Re-evaluated:Patient Re-evaluated prior to inductionOxygen Delivery Method: Simple face mask Dental Injury: Teeth and Oropharynx as per pre-operative assessment

## 2015-04-08 NOTE — Op Note (Signed)
NAMEAIJAH, LATTNER           ACCOUNT NO.:  [id number]  MEDICAL RECORD NO.:  [id number]  LOCATION:  3545                         FACILITY:  [facility]  PHYSICIAN:  Pietro Cassis. Alvan Dame, M.D.  DATE OF BIRTH:  Jan 26, 1951  DATE OF PROCEDURE:  04/08/2015 DATE OF DISCHARGE:                              OPERATIVE REPORT   PREOPERATIVE DIAGNOSIS:  Failed left total knee arthroplasty, related aseptic loosening.  POSTOPERATIVE DIAGNOSES/FINDINGS: 1. Failed left total knee arthroplasty, related aseptic loosening. 2. Significant bone loss involving the posterior femoral condyles     which were basically nonexistent but intact medial and lateral     collateral ligaments.  PROCEDURE:  Revision left total knee arthroplasty.  Components used on the femoral side; size 3 TC3 femur with an 8 mm distal lateral augment, 4 mm posterior medial and lateral augments, a size 31 mm sleeve, a size 13 x 30 cemented stem, and a size +2 adapter 5 degree bolt.  On the tibia side, it was a size 2 MBT revision tibial tray with 13 x 30 stem, 5 mm medial and lateral augments, and a 29 cemented sleeve.  We used a size 10 mm TC3 insert.  SURGEON:  Paralee Cancel, M.D.  ASSISTANT:  Danae Orleans, PA-C.  ANESTHESIA:  Spinal.  SPECIMENS:  None.  COMPLICATIONS:  None.  DRAINS:  None.  TOURNIQUET TIME:  71 minute at 250 mmHg.  INDICATIONS FOR PROCEDURE:  Ms. Mcenery is a 64 year old female, kindly referred for surgical consideration of a painful left total knee arthroplasty.  Workup was consistent for an aseptic failure of her left knee with positive bone scan, persistent pain.  A potential revision surgery needed following revision surgery, risks of infection, DVT, component failure.  We discussed the postoperative course and expectations.  We discussed the limitations of revision surgery in the setting of total knee arthroplasty often times.  Consent was obtained for benefit of pain relief.  PROCEDURE IN  DETAIL:  The patient was brought to the operative theater. Once adequate anesthesia, preoperative antibiotics, Ancef, 1 g of tranexamic acid, and 10 mg of Decadron administered, the patient was positioned supine.  A left thigh tourniquet was placed.  The left lower extremity was then prepped and draped in sterile fashion.  A time-out was performed identifying the patient, planned procedure, and extremity.  The leg was exsanguinated.  Tourniquet elevated to 250 mmHg.  The patient's old incision was excised due to widening of the scar tissue. Soft tissue planes were created.  Median arthrotomy was then made encountering clear synovial fluid.  The exposure was now obtained including medial and lateral synovectomies, encountering significant synovitic response related to polyethylene wear.  With the proximal-medial peel and retractors placed medially and laterally, I was able to sublux the tibia anteriorly.  Using the thin ACL saw, we easily elevated the tibial tray, and it was then removed without significant bone loss.  There was osteolytic change in the proximal tibia which was debrided.  At this point, the femoral components were removed again using a thin ACL saw, medially and laterally along the component and bone interface. The component were removed easily.  At this point, we found a significant osteolytic loss of bone  in the posterior aspect of the femur as well as distally.  All of this was debrided back to the remaining distal femoral skeleton.  At this point, I went ahead on the tibia side; and using an extramedullary guide, I made a cut, parallel to the tibial crest with 2 degrees of posterior slope.  This removed the remaining cement off the proximal tibia.  I then removed the remaining cement within the canal. At this point, the femoral canal was opened with a drill.  The femur and the tibia were then reamed by hand to a 15 mm reamer.  Tibial preparation was carried out  by broaching to a size 29 broach, and a trial component was placed into the tibial cut surface to use as a reference for rotation of the femoral component.  On the femoral side, we placed an intramedullary rod.  I evaluated the distal femoral bone stock.  I elected to use an 8 mm augment distally laterally and none medially.  Medial based bone structure revealed intact distal segment.  With this, I then sized and selected a size 3 femoral component.  The anterior, posterior, and chamfer cuts were all revisited with a +2 bolt, with minimal bone needed to be resected.  At this point, I elected to repair for a sleeve based on the amount of bone loss of the distal femur.  We did the preparing drill and then broached with a 31 broach.  The box cut was made for TC3 component.  At this point, the trial component was configured on the tibia and femoral side with a 15 mm insert.  The knee came to full extension without significant medial and lateral laxity and flexed nicely.  With these trial components in place, I evaluated the patella.  With the patellar component placed, the free cut level was only 20-21 mm.  I elected to maintain the patellar button as it did not appear to be grossly loose.  I did perform synovectomy around this and removed some overhanging bone.  At this point, all the trial components were removed.  We measured and then placed cement restrictors into the distal femur and proximal tibia. Final components were opened on the back table.  The knee was irrigated with normal saline solution 1 L, and the knee synovial capsule junction was injected with 0.25% Marcaine with epinephrine and morphine with 30 mL of saline for a total of 61 mL with 1 mL of Toradol.  Final components were configured on the back table under my direct supervision and assistance.  Cement was mixed.  The final components were then cemented into place.  Based on the use of the 5 mm tibial augments,  we used a size 10 mm insert.  The knee was brought to full extension to allow the cement to cure.  Once the cement had fully cured, excessive cement was removed throughout the knee.  The tourniquet had been let down after 71 minutes.  We selected a size 10 mm insert based on the stability of the knee.  The final 10 insert was then placed in the knee and brought to extension. The knee was re-irrigated with 500 mL of normal saline solution with pulse lavage.  At this point, the knee was brought to flexion.  The knee extensor mechanism was reapproximated using a combination of #1 Vicryl and 0 V-Loc sutures.  The remaining wound was closed with 2-0 Vicryl and a running 3-0 Monocryl.  The knee was cleaned, dried, and dressed  sterilely using surgical glue and an Aquacel dressing.  She was then brought to the recovery room in stable condition tolerating the procedure well.  The findings were reviewed with family.  She will be weightbearing as tolerated, work on range of motion.     Pietro Cassis Alvan Dame, M.D.     MDO/MEDQ  D:  04/08/2015  T:  04/08/2015  Job:  [id number]

## 2015-04-08 NOTE — Anesthesia Postprocedure Evaluation (Signed)
  Anesthesia Post-op Note  Patient: Helen Hardy  Procedure(s) Performed: Procedure(s) (LRB): REVISION LEFT TOTAL KNEE  (Left)  Patient Location: PACU  Anesthesia Type: Spinal  Level of Consciousness: awake and alert   Airway and Oxygen Therapy: Patient Spontanous Breathing  Post-op Pain: mild  Post-op Assessment: Post-op Vital signs reviewed, Patient's Cardiovascular Status Stable, Respiratory Function Stable, Patent Airway and No signs of Nausea or vomiting  Last Vitals:  Filed Vitals:   04/08/15 1030  BP: 147/95  Pulse: 74  Temp:   Resp: 11    Post-op Vital Signs: stable   Complications: No apparent anesthesia complications

## 2015-04-08 NOTE — Transfer of Care (Signed)
Immediate Anesthesia Transfer of Care Note  Patient: Helen Hardy  Procedure(s) Performed: Procedure(s): REVISION LEFT TOTAL KNEE  (Left)  Patient Location: PACU  Anesthesia Type:MAC and Spinal  Level of Consciousness: awake, alert , oriented and patient cooperative  Airway & Oxygen Therapy: Patient Spontanous Breathing and Patient connected to face mask oxygen  Post-op Assessment: Report given to RN and Post -op Vital signs reviewed and stable  Post vital signs: Reviewed and stable  Last Vitals:  Filed Vitals:   04/08/15 0533  BP: 151/99  Pulse: 103  Temp: 36.8 C  Resp: 18    Complications: No apparent anesthesia complications

## 2015-04-08 NOTE — Brief Op Note (Signed)
04/08/2015  9:36 AM  PATIENT:  Helen Hardy  64 y.o. female  PRE-OPERATIVE DIAGNOSIS:  aseptic failure left total knee   POST-OPERATIVE DIAGNOSIS:  aseptic failure left knee  PROCEDURE:  Procedure(s): REVISION LEFT TOTAL KNEE  (Left)  SURGEON:  Surgeon(s) and Role:    * Paralee Cancel, MD - Primary  PHYSICIAN ASSISTANT: Danae Orleans, PA-C  ANESTHESIA:   spinal  EBL:  Total I/O In: -  Out: 500 [Urine:425; Blood:75]  BLOOD ADMINISTERED:none  DRAINS: none   LOCAL MEDICATIONS USED:  MARCAINE     SPECIMEN:  No Specimen  DISPOSITION OF SPECIMEN:  N/A  COUNTS:  YES  TOURNIQUET:  71 minutes at 250 mmHg  DICTATION: .Other Dictation: Dictation Number [id number]  PLAN OF CARE: Admit to inpatient   PATIENT DISPOSITION:  PACU - hemodynamically stable.   Delay start of Pharmacological VTE agent (>24hrs) due to surgical blood loss or risk of bleeding: no

## 2015-04-08 NOTE — Anesthesia Preprocedure Evaluation (Addendum)
Anesthesia Evaluation  Patient identified by MRN, date of birth, ID band Patient awake    Reviewed: Allergy & Precautions, NPO status , Patient's Chart, lab work & pertinent test results  Airway Mallampati: II  TM Distance: >3 FB Neck ROM: Full    Dental no notable dental hx. (+) Partial Upper   Pulmonary neg pulmonary ROS,    Pulmonary exam normal breath sounds clear to auscultation       Cardiovascular hypertension, Pt. on medications Normal cardiovascular exam Rhythm:Regular Rate:Normal     Neuro/Psych negative neurological ROS  negative psych ROS   GI/Hepatic negative GI ROS, Neg liver ROS,   Endo/Other  Morbid obesity  Renal/GU negative Renal ROS  negative genitourinary   Musculoskeletal  (+) Arthritis , Rheumatoid disorders and steroids,    Abdominal   Peds negative pediatric ROS (+)  Hematology negative hematology ROS (+)   Anesthesia Other Findings   Reproductive/Obstetrics negative OB ROS                           Anesthesia Physical Anesthesia Plan  ASA: III  Anesthesia Plan: Spinal   Post-op Pain Management:    Induction:   Airway Management Planned: Simple Face Mask  Additional Equipment:   Intra-op Plan:   Post-operative Plan:   Informed Consent: I have reviewed the patients History and Physical, chart, labs and discussed the procedure including the risks, benefits and alternatives for the proposed anesthesia with the patient or authorized representative who has indicated his/her understanding and acceptance.   Dental advisory given  Plan Discussed with: CRNA  Anesthesia Plan Comments:         Anesthesia Quick Evaluation

## 2015-04-09 LAB — BASIC METABOLIC PANEL
Anion gap: 7 (ref 5–15)
BUN: 16 mg/dL (ref 6–20)
CO2: 25 mmol/L (ref 22–32)
Calcium: 8.5 mg/dL — ABNORMAL LOW (ref 8.9–10.3)
Chloride: 110 mmol/L (ref 101–111)
Creatinine, Ser: 0.64 mg/dL (ref 0.44–1.00)
GFR calc Af Amer: >60 mL/min (ref >60)
GFR calc non Af Amer: >60 mL/min (ref >60)
Glucose, Bld: 147 mg/dL — ABNORMAL HIGH (ref 65–99)
Potassium: 4.6 mmol/L (ref 3.5–5.1)
Sodium: 142 mmol/L (ref 135–145)

## 2015-04-09 LAB — CBC
HCT: 25.4 % — ABNORMAL LOW (ref 36.0–46.0)
Hemoglobin: 8.4 g/dL — ABNORMAL LOW (ref 12.0–15.0)
MCH: 35.4 pg — ABNORMAL HIGH (ref 26.0–34.0)
MCHC: 33.1 g/dL (ref 30.0–36.0)
MCV: 107.2 fL — ABNORMAL HIGH (ref 78.0–100.0)
Platelets: 138 10*3/uL — ABNORMAL LOW (ref 150–400)
RBC: 2.37 MIL/uL — ABNORMAL LOW (ref 3.87–5.11)
RDW: 14.8 % (ref 11.5–15.5)
WBC: 5.8 10*3/uL (ref 4.0–10.5)

## 2015-04-09 NOTE — Progress Notes (Signed)
Physical Therapy Treatment Patient Details Name: Helen Hardy MRN: [id number] DOB: 06-Jan-1951 Today's Date: 04/09/2015    History of Present Illness Revision L TKA, h/o RA    PT Comments    Progressing well, tolerating ROM, encouraged self knee flexion and quad sets.  Follow Up Recommendations  Home health PT;Supervision/Assistance - 24 hour     Equipment Recommendations  Rolling walker with 5" wheels;3in1 (PT)    Recommendations for Other Services       Precautions / Restrictions      Mobility  Bed Mobility                  Transfers     Transfers: Sit to/from Stand Sit to Stand: Min assist         General transfer comment: cues for hand and L leg position/placement, min for safety, extra lifting assist from recliner, 2 trials  Ambulation/Gait Ambulation/Gait assistance: Min assist Ambulation Distance (Feet): 100 Feet Assistive device: Rolling walker (2 wheeled) Gait Pattern/deviations: Step-to pattern;Trunk flexed     General Gait Details: cues for posture and sequence   Stairs            Wheelchair Mobility    Modified Rankin (Stroke Patients Only)       Balance                                    Cognition Arousal/Alertness: Awake/alert                          Exercises Total Joint Exercises Ankle Circles/Pumps: AROM;Both;10 reps Quad Sets: AROM;Both;10 reps Towel Squeeze: AROM;Both;10 reps Heel Slides: AAROM;Left;10 reps Hip ABduction/ADduction: AAROM;Left;10 reps Straight Leg Raises: AAROM;Left;10 reps Goniometric ROM: 0-45 L knee flexion    General Comments        Pertinent Vitals/Pain Pain Score: 6  Pain Location: L knee Pain Descriptors / Indicators: Aching;Discomfort Pain Intervention(s): Monitored during session;Premedicated before session;Repositioned;Ice applied    Home Living                      Prior Function            PT Goals (current goals can now be  found in the care plan section) Progress towards PT goals: Progressing toward goals    Frequency  7X/week    PT Plan Current plan remains appropriate    Co-evaluation             End of Session Equipment Utilized During Treatment: Gait belt Activity Tolerance: Patient tolerated treatment well Patient left: in bed;with call bell/phone within reach     Time: 1359-1422 PT Time Calculation (min) (ACUTE ONLY): 23 min  Charges:  $Gait Training: 8-22 mins $Therapeutic Exercise: 23-37 mins                    G Codes:      Helen Hardy 04/09/2015, 3:27 PM

## 2015-04-09 NOTE — Progress Notes (Signed)
Physical Therapy Treatment Patient Details Name: Helen Hardy MRN: [id number] DOB: 09/07/1950 Today's Date: 04/09/2015    History of Present Illness Revision L TKA, h/o RA    PT Comments    Patient  Did much better today, mild  Dizziness. BP OK.   Follow Up Recommendations  Home health PT;Supervision/Assistance - 24 hour     Equipment Recommendations  Rolling walker with 5" wheels;3in1 (PT)    Recommendations for Other Services       Precautions / Restrictions Precautions Precautions: Knee Precaution Comments: use sneakers Restrictions Weight Bearing Restrictions: No    Mobility  Bed Mobility Overal bed mobility: Needs Assistance Bed Mobility: Supine to Sit     Supine to sit: Min assist     General bed mobility comments: Min assist for LLE, cues for technique.   Transfers Overall transfer level: Needs assistance Equipment used: Rolling walker (2 wheeled) Transfers: Sit to/from Stand Sit to Stand: Min guard         General transfer comment: cues for hand and L leg position/placement, min guard for safety  Ambulation/Gait Ambulation/Gait assistance: Min assist Ambulation Distance (Feet): 20 Feet (x 2) Assistive device: Rolling walker (2 wheeled) Gait Pattern/deviations: Step-to pattern;Antalgic;Trunk flexed     General Gait Details: cues for posture and sequence   Stairs            Wheelchair Mobility    Modified Rankin (Stroke Patients Only)       Balance Overall balance assessment: Needs assistance Sitting-balance support: No upper extremity supported;Feet supported Sitting balance-Leahy Scale: Good     Standing balance support: Bilateral upper extremity supported;During functional activity Standing balance-Leahy Scale: Fair                      Cognition Arousal/Alertness: Awake/alert Behavior During Therapy: WFL for tasks assessed/performed Overall Cognitive Status: Within Functional Limits for tasks assessed                       Exercises      General Comments        Pertinent Vitals/Pain Pain Assessment: 0-10 Pain Score: 8  Pain Location: L  knee Pain Descriptors / Indicators: Aching Pain Intervention(s): Premedicated before session;Monitored during session    Home Living Family/patient expects to be discharged to:: Private residence Living Arrangements: Spouse/significant other Available Help at Discharge: Family Type of Home: House Home Access: Stairs to enter   Home Layout: One level Home Equipment: Bedside commode;Tub bench      Prior Function Level of Independence: Independent          PT Goals (current goals can now be found in the care plan section) Acute Rehab PT Goals Patient Stated Goal: go home Progress towards PT goals: Progressing toward goals    Frequency  7X/week    PT Plan Current plan remains appropriate    Co-evaluation PT/OT/SLP Co-Evaluation/Treatment: Yes Reason for Co-Treatment: For patient/therapist safety PT goals addressed during session: Mobility/safety with mobility OT goals addressed during session: ADL's and self-care     End of Session Equipment Utilized During Treatment: Gait belt Activity Tolerance: Patient tolerated treatment well Patient left: in chair;with call bell/phone within reach;with family/visitor present     Time: 7654-6503 PT Time Calculation (min) (ACUTE ONLY): 32 min  Charges:  $Gait Training: 8-22 mins                    G Codes:  Helen Hardy 04/09/2015, 1:07 PM Helen Hardy PT [phone]

## 2015-04-09 NOTE — Evaluation (Signed)
Occupational Therapy Evaluation Patient Details Name: Helen Hardy MRN: [id number] DOB: [birth year] Today's Date: 04/09/2015    History of Present Illness Revision L TKA, h/o RA   Clinical Impression   Patient presenting with decreased ADL and functional mobility independence secondary to above. Patient independent to mod I PTA. Patient currently functioning at an overall min guard-min assist level. Patient will benefit from acute OT to increase overall independence in the areas of ADLs, functional mobility, and overall safety in order to safely discharge home with assistance from significant other and HHOT.   Pt stated she started feeling dizzy during mobility. After mobility/activity, pt sat in recliner and BP=134/91 and HR=56.     Follow Up Recommendations  Supervision/Assistance - 24 hour;Home health OT    Equipment Recommendations  None recommended by OT    Recommendations for Other Services  None at this time   Precautions / Restrictions Precautions Precautions: Knee Precaution Comments: use sneakers Restrictions Weight Bearing Restrictions: No    Mobility Bed Mobility Overal bed mobility: Needs Assistance Bed Mobility: Supine to Sit     Supine to sit: Min assist     General bed mobility comments: Min assist for LLE, cues for technique.   Transfers Overall transfer level: Needs assistance Equipment used: Rolling walker (2 wheeled) Transfers: Sit to/from Stand Sit to Stand: Min guard General transfer comment: cues for hand and L leg position/placement, min guard for safety    Balance Overall balance assessment: Needs assistance Sitting-balance support: No upper extremity supported;Feet supported Sitting balance-Leahy Scale: Good     Standing balance support: Bilateral upper extremity supported;During functional activity Standing balance-Leahy Scale: Fair    ADL Overall ADL's : Needs assistance/impaired Eating/Feeding: Set up;Sitting   Grooming:  Supervision/safety;Standing   Upper Body Bathing: Set up;Sitting   Lower Body Bathing: Minimal assistance;Sit to/from stand;Cueing for safety   Upper Body Dressing : Set up;Sitting   Lower Body Dressing: Minimal assistance;Sit to/from stand   Toilet Transfer: Min guard;RW;Comfort height toilet   Toileting- Water quality scientist and Hygiene: Supervision/safety;Sit to/from Nurse, children's Details (indicate cue type and reason): did not occur Functional mobility during ADLs: Min guard;Supervision/safety;Rolling walker General ADL Comments: Pt ambulated <> BR for toilet transfer on/off elevated toilet seat. Supervision>min guard for safety, cues for safety.     Pertinent Vitals/Pain Pain Assessment: 0-10 Pain Score: 8  Pain Location: left knee Pain Descriptors / Indicators: Aching Pain Intervention(s): Monitored during session;Repositioned   Extremity/Trunk Assessment Upper Extremity Assessment Upper Extremity Assessment: Overall WFL for tasks assessed   Lower Extremity Assessment Lower Extremity Assessment: Defer to PT evaluation   Cervical / Trunk Assessment Cervical / Trunk Assessment: Normal   Communication Communication Communication: No difficulties   Cognition Arousal/Alertness: Awake/alert Behavior During Therapy: WFL for tasks assessed/performed Overall Cognitive Status: Within Functional Limits for tasks assessed              Home Living Family/patient expects to be discharged to:: Private residence Living Arrangements: Spouse/significant other Available Help at Discharge: Family Type of Home: House Home Access: Stairs to enter Technical brewer of Steps: 1   Home Layout: One level     Bathroom Shower/Tub: Tub/shower unit;Curtain   Bathroom Toilet: Handicapped height     Home Equipment: Bedside commode;Tub bench    Prior Functioning/Environment Level of Independence: Independent     OT Diagnosis: Generalized weakness;Acute  pain   OT Problem List: Decreased strength;Decreased activity tolerance;Impaired balance (sitting and/or standing);Decreased safety awareness;Decreased knowledge of use  of DME or AE;Pain;Decreased knowledge of precautions   OT Treatment/Interventions: Self-care/ADL training;Therapeutic exercise;Energy conservation;DME and/or AE instruction;Therapeutic activities;Patient/family education;Balance training    OT Goals(Current goals can be found in the care plan section) Acute Rehab OT Goals Patient Stated Goal: go home OT Goal Formulation: With patient/family Time For Goal Achievement: 04/23/15 Potential to Achieve Goals: Good ADL Goals Pt Will Perform Grooming: with modified independence;standing Pt Will Perform Lower Body Bathing: with modified independence;sit to/from stand Pt Will Perform Lower Body Dressing: with modified independence;sit to/from stand Pt Will Transfer to Toilet: with modified independence;ambulating Pt Will Perform Tub/Shower Transfer: Tub transfer;tub bench;rolling walker;with modified independence;ambulating Additional ADL Goal #1: Pt will be mod I with functional mobility using RW  OT Frequency: Min 2X/week   Barriers to D/C: None known at this time      Co-evaluation PT/OT/SLP Co-Evaluation/Treatment: Yes Reason for Co-Treatment: For patient/therapist safety PT goals addressed during session: Mobility/safety with mobility OT goals addressed during session: ADL's and self-care     End of Session Equipment Utilized During Treatment: Rolling walker  Activity Tolerance: Patient tolerated treatment well Patient left: in chair;with call bell/phone within reach;with family/visitor present   Time: 8546-2703 OT Time Calculation (min): 28 min Charges:  OT General Charges $OT Visit: 1 Procedure OT Evaluation $Initial OT Evaluation Tier I: 1 Procedure  Samika Vetsch , MS, OTR/L, CLT Pager: [phone]  04/09/2015, 10:04 AM

## 2015-04-09 NOTE — Progress Notes (Signed)
Physical Therapy Treatment Patient Details Name: Helen Hardy MRN: [id number] DOB: [birth year] Today's Date: 04/09/2015    History of Present Illness Revision L TKA, h/o RA    PT Comments    Patient is motivated, progreessing well.   Follow Up Recommendations  Home health PT;Supervision/Assistance - 24 hour     Equipment Recommendations  Rolling walker with 5" wheels;3in1 (PT)    Recommendations for Other Services       Precautions / Restrictions Precautions Precautions: Knee Precaution Comments: use sneakers Restrictions Weight Bearing Restrictions: No    Mobility  Bed Mobility Overal bed mobility: Needs Assistance Bed Mobility: Supine to Sit     Supine to sit: Min assist     General bed mobility comments: Min assist for LLE, cues for technique.   Transfers Overall transfer level: Needs assistance Equipment used: Rolling walker (2 wheeled) Transfers: Sit to/from Stand Sit to Stand: Min assist         General transfer comment: cues for hand and L leg position/placement, min for safety, extra lifting assist from recliner, 2 trials  Ambulation/Gait Ambulation/Gait assistance: Min assist Ambulation Distance (Feet): 100 Feet Assistive device: Rolling walker (2 wheeled) Gait Pattern/deviations: Step-to pattern;Trunk flexed     General Gait Details: cues for posture and sequence   Stairs            Wheelchair Mobility    Modified Rankin (Stroke Patients Only)       Balance Overall balance assessment: Needs assistance Sitting-balance support: No upper extremity supported;Feet supported Sitting balance-Leahy Scale: Good     Standing balance support: Bilateral upper extremity supported;During functional activity Standing balance-Leahy Scale: Fair                      Cognition Arousal/Alertness: Awake/alert Behavior During Therapy: WFL for tasks assessed/performed Overall Cognitive Status: Within Functional Limits for tasks  assessed                      Exercises      General Comments        Pertinent Vitals/Pain Pain Assessment: 0-10 Pain Score: 6  Pain Location: L knee Pain Descriptors / Indicators: Aching;Discomfort Pain Intervention(s): Monitored during session;Premedicated before session;Repositioned;Ice applied    Home Living Family/patient expects to be discharged to:: Private residence Living Arrangements: Spouse/significant other Available Help at Discharge: Family Type of Home: House Home Access: Stairs to enter   Home Layout: One level Home Equipment: Bedside commode;Tub bench      Prior Function Level of Independence: Independent          PT Goals (current goals can now be found in the care plan section) Acute Rehab PT Goals Patient Stated Goal: go home Progress towards PT goals: Progressing toward goals    Frequency  7X/week    PT Plan Current plan remains appropriate    Co-evaluation PT/OT/SLP Co-Evaluation/Treatment: Yes Reason for Co-Treatment: For patient/therapist safety PT goals addressed during session: Mobility/safety with mobility OT goals addressed during session: ADL's and self-care     End of Session Equipment Utilized During Treatment: Gait belt Activity Tolerance: Patient tolerated treatment well Patient left: in chair;with call bell/phone within reach;with family/visitor present     Time: 1036-1050 PT Time Calculation (min) (ACUTE ONLY): 14 min  Charges:  $Gait Training: 8-22 mins                    G Codes:      Tresa Endo  [place] 04/09/2015, 1:10 PM

## 2015-04-09 NOTE — Progress Notes (Signed)
Patient ID: Helen Hardy, female   DOB: March 17, 1951, 64 y.o.   MRN: [id number] Subjective: 1 Day Post-Op Procedure(s) (LRB): REVISION LEFT TOTAL KNEE  (Left)    Patient reports pain as mild to moderate.  Had been OOB in chair yesterday.  No events  Objective:   VITALS:   Filed Vitals:   04/09/15 1015  BP: 99/32  Pulse: 83  Temp: 97.5 F (36.4 C)  Resp: 18    Neurovascular intact Incision: dressing C/D/I  LABS  Recent Labs  04/09/15 0330  HGB 8.4*  HCT 25.4*  WBC 5.8  PLT 138*     Recent Labs  04/09/15 0330  NA 142  K 4.6  BUN 16  CREATININE 0.64  GLUCOSE 147*    No results for input(s): LABPT, INR in the last 72 hours.   Assessment/Plan: 1 Day Post-Op Procedure(s) (LRB): REVISION LEFT TOTAL KNEE  (Left)   Advance diet Up with therapy Plan for discharge tomorrow Discharge home with home health

## 2015-04-09 NOTE — Care Management Note (Addendum)
Case Management Note  Patient Details  Name: SHADIA LAROSE MRN: [id number] Date of Birth: 04-Apr-1951  Subjective/Objective:                  REVISION LEFT TOTAL KNEE (Left)  Action/Plan:  Discharge planning Expected Discharge Date:  04/10/15               Expected Discharge Plan:  [place]  In-House Referral:     Discharge planning Services  CM Consult  Post Acute Care Choice:  Home Health Choice offered to:  Patient  DME Arranged:  3-N-1, Walker rolling DME Agency:  [place]:  PT [place] Agency:  [place]  Status of Service:  Completed, signed off  Medicare Important Message Given:    Date Medicare IM Given:    Medicare IM give by:    Date Additional Medicare IM Given:    Additional Medicare Important Message give by:     If discussed at [place] of Stay Meetings, dates discussed:    Additional Comments: Utilization Review complete. CM met with pt in room to offer choice of home health agency.  Pt chooses [facility] to render HHPT.  Address and contact information verified by pt.  Referral called to [facility].  Cm called [facility] DME rep, Lecretia to please deliver a rolling walker and 3n1 to room (ht of 5' 240lbs given to [facility] DME rep).  No other CM needs were communicated. Dellie Catholic, RN 04/09/2015, 1:15 PM

## 2015-04-10 LAB — CBC
HCT: 23.7 % — ABNORMAL LOW (ref 36.0–46.0)
Hemoglobin: 7.8 g/dL — ABNORMAL LOW (ref 12.0–15.0)
MCH: 35.6 pg — ABNORMAL HIGH (ref 26.0–34.0)
MCHC: 32.9 g/dL (ref 30.0–36.0)
MCV: 108.2 fL — ABNORMAL HIGH (ref 78.0–100.0)
Platelets: 128 10*3/uL — ABNORMAL LOW (ref 150–400)
RBC: 2.19 MIL/uL — ABNORMAL LOW (ref 3.87–5.11)
RDW: 15.1 % (ref 11.5–15.5)
WBC: 6.2 10*3/uL (ref 4.0–10.5)

## 2015-04-10 LAB — BASIC METABOLIC PANEL
Anion gap: 3 — ABNORMAL LOW (ref 5–15)
BUN: 17 mg/dL (ref 6–20)
CO2: 27 mmol/L (ref 22–32)
Calcium: 8.2 mg/dL — ABNORMAL LOW (ref 8.9–10.3)
Chloride: 111 mmol/L (ref 101–111)
Creatinine, Ser: 0.57 mg/dL (ref 0.44–1.00)
GFR calc Af Amer: >60 mL/min (ref >60)
GFR calc non Af Amer: >60 mL/min (ref >60)
Glucose, Bld: 121 mg/dL — ABNORMAL HIGH (ref 65–99)
Potassium: 4.5 mmol/L (ref 3.5–5.1)
Sodium: 141 mmol/L (ref 135–145)

## 2015-04-10 MED ORDER — FERROUS SULFATE 325 (65 FE) MG PO TABS: 325.0000 mg | ORAL_TABLET | Freq: Three times a day (TID) | ORAL | Status: DC

## 2015-04-10 MED ORDER — DOCUSATE SODIUM 100 MG PO CAPS: 100.0000 mg | ORAL_CAPSULE | Freq: Two times a day (BID) | ORAL | Status: DC

## 2015-04-10 MED ORDER — CYCLOBENZAPRINE HCL 10 MG PO TABS: 5.0000 mg | ORAL_TABLET | Freq: Three times a day (TID) | ORAL | Status: DC | PRN

## 2015-04-10 MED ORDER — POLYETHYLENE GLYCOL 3350 17 G PO PACK: 17.0000 g | PACK | Freq: Two times a day (BID) | ORAL | Status: DC

## 2015-04-10 MED ORDER — HYDROCODONE-ACETAMINOPHEN 7.5-325 MG PO TABS: 1.0000 | ORAL_TABLET | ORAL | Status: DC | PRN

## 2015-04-10 MED ORDER — HEPARIN SOD (PORK) LOCK FLUSH 100 UNIT/ML IV SOLN
500.0000 [IU] | INTRAVENOUS | Status: AC | PRN
  Administered 2015-04-10: 500 [IU]

## 2015-04-10 MED ORDER — ASPIRIN 325 MG PO TBEC: 325.0000 mg | DELAYED_RELEASE_TABLET | Freq: Two times a day (BID) | ORAL | Status: AC

## 2015-04-10 NOTE — Progress Notes (Signed)
     Subjective: 2 Days Post-Op Procedure(s) (LRB): REVISION LEFT TOTAL KNEE  (Left)    Seen by Dr. Alvan Dame. Patient reports pain as moderate, pain controlled with medication. No events throughout the night. Discharge home depending on PT and pain control.  Objective:   VITALS:   Filed Vitals:   04/10/15 0602  BP: 132/70  Pulse: 89  Temp: 97.9 F (36.6 C)  Resp: 18    Dorsiflexion/Plantar flexion intact Incision: dressing C/D/I No cellulitis present Compartment soft  LABS  Recent Labs  04/09/15 0330 04/10/15 0600  HGB 8.4* 7.8*  HCT 25.4* 23.7*  WBC 5.8 6.2  PLT 138* 128*     Recent Labs  04/09/15 0330 04/10/15 0600  NA 142 141  K 4.6 4.5  BUN 16 17  CREATININE 0.64 0.57  GLUCOSE 147* 121*     Assessment/Plan: 2 Days Post-Op Procedure(s) (LRB): REVISION LEFT TOTAL KNEE  (Left) Up with therapy Discharge home with home health  Follow up in 2 weeks at [facility]. Follow up with OLIN,Reza Crymes D in 2 weeks.  Contact information:  [facility] [address], [place] [address] [phone]    Morbid Obesity (BMI >40)  Estimated body mass index is 46.87 kg/(m^2) as calculated from the following:   Height as of this encounter: 5' (1.524 m).   Weight as of this encounter: 108.863 kg (240 lb). Patient also counseled that weight may inhibit the healing process Patient counseled that losing weight will help with future health issues        [place] Pugh. Mahdi Frye   PAC  04/10/2015, 8:28 AM

## 2015-04-10 NOTE — Discharge Instructions (Signed)

## 2015-04-10 NOTE — Progress Notes (Signed)
Physical Therapy Treatment Patient Details Name: ROZLYN YERBY MRN: [id number] DOB: Oct 03, 1950 Today's Date: 04/10/2015    History of Present Illness Revision L TKA, h/o RA    PT Comments    Patient is doing very well today. Ready for DC.  Follow Up Recommendations  Home health PT;Supervision/Assistance - 24 hour     Equipment Recommendations       Recommendations for Other Services       Precautions / Restrictions Precautions Precautions: Knee Precaution Comments: use sneakers Restrictions Weight Bearing Restrictions: Yes Other Position/Activity Restrictions: WBAT    Mobility  Bed Mobility Overal bed mobility: Needs Assistance Bed Mobility: Supine to Sit     Supine to sit: Modified independent (Device/Increase time)     General bed mobility comments: Supervision for safety, no physical assistance for management of LLE  Transfers Overall transfer level: Needs assistance Equipment used: Rolling walker (2 wheeled) Transfers: Sit to/from Stand Sit to Stand: Supervision         General transfer comment: Supervision for safety, cues for hand placement and technique.   Ambulation/Gait Ambulation/Gait assistance: Min guard Ambulation Distance (Feet): 110 Feet Assistive device: Rolling walker (2 wheeled) Gait Pattern/deviations: Step-to pattern;Trunk flexed     General Gait Details: cues for posture and sequence   Stairs Stairs: Yes Stairs assistance: Min assist Stair Management: No rails;Step to pattern;Forwards;With walker      Wheelchair Mobility    Modified Rankin (Stroke Patients Only)       Balance Overall balance assessment: Needs assistance Sitting-balance support: No upper extremity supported;Feet supported Sitting balance-Leahy Scale: Good     Standing balance support: Bilateral upper extremity supported;During functional activity Standing balance-Leahy Scale: Fair                      Cognition Arousal/Alertness:  Awake/alert Behavior During Therapy: WFL for tasks assessed/performed Overall Cognitive Status: Within Functional Limits for tasks assessed                      Exercises Total Joint Exercises Ankle Circles/Pumps: AROM;Both;10 reps Quad Sets: AROM;Both;10 reps Heel Slides: AAROM;Left;10 reps Hip ABduction/ADduction: AAROM;Left;10 reps Straight Leg Raises: AAROM;Left;10 reps Long Arc Quad: AROM;Left;10 reps;Seated Goniometric ROM: 0-60    General Comments        Pertinent Vitals/Pain Pain Assessment: No/denies pain Pain Score: 6  Pain Location: L knee Pain Descriptors / Indicators: Aching;Discomfort Pain Intervention(s): Limited activity within patient's tolerance;Monitored during session;Premedicated before session    Home Living                      Prior Function            PT Goals (current goals can now be found in the care plan section) Progress towards PT goals: Progressing toward goals    Frequency       PT Plan Current plan remains appropriate    Co-evaluation             End of Session   Activity Tolerance: Patient tolerated treatment well Patient left: in chair;with call bell/phone within reach;with family/visitor present     Time: 1130-1220 PT Time Calculation (min) (ACUTE ONLY): 50 min  Charges:  $Gait Training: 8-22 mins $Therapeutic Exercise: 8-22 mins $Self Care/Home Management: 15-Mar-2023                    G Codes:      Claretha Cooper 04/10/2015, 1:58 PM  Tresa Endo PT [phone]

## 2015-04-10 NOTE — Plan of Care (Addendum)
Problem: Consults Goal: Diagnosis- Total Joint Replacement

## 2015-04-10 NOTE — Progress Notes (Signed)
Occupational Therapy Treatment Patient Details Name: Helen Hardy MRN: [id number] DOB: July 26, 1950 Today's Date: 04/10/2015    History of present illness Revision L TKA, h/o RA   OT comments  Patient progressing nicely towards OT goals, continue plan of care for now. Pt will benefit from AE due to body habitus and h/o RA to increase independence with LB ADLs.    Follow Up Recommendations  Supervision/Assistance - 24 hour;Home health OT    Equipment Recommendations  Other (comment) (AE - reacher, sock aid, LH sponge, LH shoe horn)    Recommendations for Other Services  None at this time   Precautions / Restrictions Precautions Precautions: Knee Precaution Comments: use sneakers Restrictions Weight Bearing Restrictions: Yes Other Position/Activity Restrictions: WBAT    Mobility Bed Mobility Overal bed mobility: Needs Assistance Bed Mobility: Supine to Sit Supine to sit: Supervision General bed mobility comments: Supervision for safety, no physical assistance for management of LLE  Transfers Overall transfer level: Needs assistance Equipment used: Rolling walker (2 wheeled) Transfers: Sit to/from Stand Sit to Stand: Supervision General transfer comment: Supervision for safety, cues for hand placement and technique.     Balance Overall balance assessment: Needs assistance Sitting-balance support: No upper extremity supported;Feet supported Sitting balance-Leahy Scale: Good     Standing balance support: Bilateral upper extremity supported;During functional activity Standing balance-Leahy Scale: Fair   ADL Overall ADL's : Needs assistance/impaired General ADL Comments: Pt found supine in bed. Pt engaged in bed mobility and sat EOB. Pt preferred to wear tennis shoes. Therapist educated pt on use of AE to increase independence with LB ADLs. Introduced AE, educated, demonstrated, and had pt return demonstrate their usage. Pt then ambulated in hallway for focus on  activity tolerance/endurance. At end of session, pt sat in recliner.      Cognition Behavior During Therapy: WFL for tasks assessed/performed Overall Cognitive Status: Within Functional Limits for tasks assessed   Pertinent Vitals/ Pain       Pain Assessment: No/denies pain   Frequency Min 2X/week     Progress Toward Goals  OT Goals(current goals can now befound in the care plan section)  Progress towards OT goals: Progressing toward goals     Plan Discharge plan remains appropriate    End of Session Equipment Utilized During Treatment: Rolling walker   Activity Tolerance Patient tolerated treatment well   Patient Left in chair;with call bell/phone within reach;with family/visitor present    Time: 0677-0340 OT Time Calculation (min): 26 min  Charges: OT General Charges $OT Visit: 1 Procedure OT Treatments $Self Care/Home Management : 8-22 mins $Therapeutic Activity: 8-22 mins  Sharlyne Koeneman , MS, OTR/L, CLT Pager: [phone]  04/10/2015, 12:15 PM

## 2015-04-10 NOTE — Care Management Important Message (Signed)
Important Message  Patient Details  Name: DUYEN BECKOM MRN: [id number] Date of Birth: 06-19-1951   Medicare Important Message Given:  [facility] notification given    Camillo Flaming 04/10/2015, 12:14 [place] Message  Patient Details  Name: SANTORIA CHASON MRN: [id number] Date of Birth: [birth year]   Medicare Important Message Given:  Yes-second notification given    Camillo Flaming 04/10/2015, 12:13 PM

## 2015-04-11 DIAGNOSIS — Z96653 Presence of artificial knee joint, bilateral: Secondary | ICD-10-CM | POA: Diagnosis not present

## 2015-04-11 DIAGNOSIS — Z8571 Personal history of Hodgkin lymphoma: Secondary | ICD-10-CM | POA: Diagnosis not present

## 2015-04-11 DIAGNOSIS — M069 Rheumatoid arthritis, unspecified: Secondary | ICD-10-CM | POA: Diagnosis not present

## 2015-04-11 DIAGNOSIS — Z4733 Aftercare following explantation of knee joint prosthesis: Secondary | ICD-10-CM | POA: Diagnosis not present

## 2015-04-11 DIAGNOSIS — Z6841 Body Mass Index (BMI) 40.0 and over, adult: Secondary | ICD-10-CM | POA: Diagnosis not present

## 2015-04-11 NOTE — Discharge Summary (Signed)
Physician Discharge Summary  Patient ID: Helen Hardy MRN: [id number] DOB/AGE: [birth year] 64 y.o.  Admit date: 04/08/2015 Discharge date: 04/10/2015   Procedures:  Procedure(s) (LRB): REVISION LEFT TOTAL KNEE  (Left)  Attending Physician:  Dr. Paralee Cancel   Admission Diagnoses:   Left knee pain s/p left TKA  Discharge Diagnoses:  Principal Problem:   S/P revision left TKA Active Problems:   S/P knee replacement   Morbid obesity ([place])  Past Medical History  Diagnosis Date  . Arthritis     Rheumatoid  . Cancer ([place])     Hodkins Lymphoma  . Hypertension   . Pneumonia   . GERD (gastroesophageal reflux disease)   . Anemia     HPI:    Helen Hardy, 64 y.o. female, has a history of pain and functional disability in the left knee(s) due to failed previous arthroplasty and patient has failed non-surgical conservative treatments for greater than 12 weeks to include NSAID's and/or analgesics, use of assistive devices and activity modification. The indications for the revision of the total knee arthroplasty are loosening of one or more components. Onset of symptoms was gradual starting 1+ years ago with gradually worsening course since that time. Prior procedures on the left knee(s) include arthroplasty. Patient currently rates pain in the left knee(s) at 9 out of 10 with activity. There is worsening of pain with activity and weight bearing, pain that interferes with activities of daily living, pain with passive range of motion and joint swelling. Patient has evidence of prosthetic loosening by imaging studies. This condition presents safety issues increasing the risk of falls. There is no current active infection. Risks, benefits and expectations were discussed with the patient. Risks including but not limited to the risk of anesthesia, blood clots, nerve damage, blood vessel damage, failure of the prosthesis, infection and up to and including death. Patient understand  the risks, benefits and expectations and wishes to proceed with surgery.   PCP: Horatio Pel, MD   Discharged Condition: good  Hospital Course:  Patient underwent the above stated procedure on 04/08/2015. Patient tolerated the procedure well and brought to the recovery room in good condition and subsequently to the floor.  POD #1 BP: 99/32 ; Pulse: 83 ; Temp: 97.5 F (36.4 C) ; Resp: 18 Patient reports pain as mild to moderate. Had been OOB in chair yesterday. No events Neurovascular intact and incision: dressing C/D/I  LABS  Basename    HGB  8.4  HCT  25.4   POD #2  BP: 132/70 ; Pulse: 89 ; Temp: 97.9 F (36.6 C) ; Resp: 18 Patient reports pain as moderate, pain controlled with medication. No events throughout the night. Discharge home. Dorsiflexion/plantar flexion intact, incision: dressing C/D/I, no cellulitis present and compartment soft.   LABS  Basename    HGB  7.8  HCT  23.7    Discharge Exam: General appearance: alert, cooperative and no distress Extremities: Homans sign is negative, no sign of DVT, no edema, redness or tenderness in the calves or thighs and no ulcers, gangrene or trophic changes  Disposition: Home with follow up in 2 weeks   Follow-up Information    Follow up with [facility].   Why:  home health physical therapy   Contact information:   [address] [place] [address] [phone]       Follow up with [facility].   Why:  3n1 and rolling walker   Contact  information:   [address] [place] [address] [phone]       Follow up with [name], MD. Schedule an appointment as soon as possible for a visit in 2 weeks.   Specialty:  Orthopedic Surgery   Contact information:   [address] [place] [address] [phone]       Discharge Instructions    Call MD / Call 911    Complete by:  As directed   If you experience chest pain or shortness  of breath, CALL 911 and be transported to the hospital emergency room.  If you develope a fever above 101 F, pus (white drainage) or increased drainage or redness at the wound, or calf pain, call your surgeon's office.     Change dressing    Complete by:  As directed   Maintain surgical dressing until follow up in the clinic. If the edges start to pull up, may reinforce with tape. If the dressing is no longer working, may remove and cover with gauze and tape, but must keep the area dry and clean.  Call with any questions or concerns.     Constipation Prevention    Complete by:  As directed   Drink plenty of fluids.  Prune juice may be helpful.  You may use a stool softener, such as Colace (over the counter) 100 mg twice a day.  Use MiraLax (over the counter) for constipation as needed.     Diet - low sodium heart healthy    Complete by:  As directed      Discharge instructions    Complete by:  As directed   Maintain surgical dressing until follow up in the clinic. If the edges start to pull up, may reinforce with tape. If the dressing is no longer working, may remove and cover with gauze and tape, but must keep the area dry and clean.  Follow up in 2 weeks at [facility]. Call with any questions or concerns.     Increase activity slowly as tolerated    Complete by:  As directed   Weight bearing as tolerated with assist device (walker, cane, etc) as directed, use it as long as suggested by your surgeon or therapist, typically at least 4-6 weeks.     TED hose    Complete by:  As directed   Use stockings (TED hose) for 2 weeks on both leg(s).  You may remove them at night for sleeping.             Medication List    STOP taking these medications        acetaminophen 650 MG CR tablet  Commonly known as:  TYLENOL     HYDROcodone-acetaminophen 5-325 MG tablet  Commonly known as:  NORCO/VICODIN  Replaced by:  HYDROcodone-acetaminophen 7.5-325 MG tablet     leflunomide 10 MG  tablet  Commonly known as:  ARAVA     ORENCIA IV      TAKE these medications        aspirin 325 MG EC tablet  Take 1 tablet (325 mg total) by mouth 2 (two) times daily.     cyclobenzaprine 10 MG tablet  Commonly known as:  FLEXERIL  Take 0.5 tablets (5 mg total) by mouth 3 (three) times daily as needed for muscle spasms.     docusate sodium 100 MG capsule  Commonly known as:  COLACE  Take 1 capsule (100 mg total) by mouth 2 (two) times daily.  estradiol 1 MG tablet  Commonly known as:  ESTRACE  Take 1 mg by mouth daily.     ferrous sulfate 325 (65 FE) MG tablet  Take 1 tablet (325 mg total) by mouth 3 (three) times daily after meals.     folic acid 825 MCG tablet  Commonly known as:  FOLVITE  Take 800 mcg by mouth 2 (two) times daily.     HYDROcodone-acetaminophen 7.5-325 MG tablet  Commonly known as:  NORCO  Take 1-2 tablets by mouth every 4 (four) hours as needed for moderate pain.     multivitamin tablet  Take 1 tablet by mouth daily.     pantoprazole 40 MG tablet  Commonly known as:  PROTONIX  Take 40 mg by mouth 2 (two) times daily.     polyethylene glycol packet  Commonly known as:  MIRALAX / GLYCOLAX  Take 17 g by mouth 2 (two) times daily.     predniSONE 5 MG tablet  Commonly known as:  DELTASONE  Take 5 mg by mouth daily with breakfast.     triamcinolone cream 0.1 %  Commonly known as:  KENALOG  Apply 1 application topically daily as needed (break out on neck from heat).     valsartan-hydrochlorothiazide 160-25 MG tablet  Commonly known as:  DIOVAN-HCT  Take 1 tablet by mouth daily.     vitamin B-12 1000 MCG tablet  Commonly known as:  CYANOCOBALAMIN  Take 1,000 mcg by mouth daily.     Vitamin D 2000 UNITS Caps  Take 2,000 Units by mouth 2 (two) times daily.         Signed: [place]. Janet Humphreys   PA-C  04/11/2015, 11:36 AM

## 2015-04-12 DIAGNOSIS — Z4733 Aftercare following explantation of knee joint prosthesis: Secondary | ICD-10-CM | POA: Diagnosis not present

## 2015-04-12 DIAGNOSIS — Z6841 Body Mass Index (BMI) 40.0 and over, adult: Secondary | ICD-10-CM | POA: Diagnosis not present

## 2015-04-12 DIAGNOSIS — M069 Rheumatoid arthritis, unspecified: Secondary | ICD-10-CM | POA: Diagnosis not present

## 2015-04-12 DIAGNOSIS — Z8571 Personal history of Hodgkin lymphoma: Secondary | ICD-10-CM | POA: Diagnosis not present

## 2015-04-12 DIAGNOSIS — Z96653 Presence of artificial knee joint, bilateral: Secondary | ICD-10-CM | POA: Diagnosis not present

## 2015-04-15 DIAGNOSIS — Z96653 Presence of artificial knee joint, bilateral: Secondary | ICD-10-CM | POA: Diagnosis not present

## 2015-04-15 DIAGNOSIS — M069 Rheumatoid arthritis, unspecified: Secondary | ICD-10-CM | POA: Diagnosis not present

## 2015-04-15 DIAGNOSIS — Z6841 Body Mass Index (BMI) 40.0 and over, adult: Secondary | ICD-10-CM | POA: Diagnosis not present

## 2015-04-15 DIAGNOSIS — Z8571 Personal history of Hodgkin lymphoma: Secondary | ICD-10-CM | POA: Diagnosis not present

## 2015-04-15 DIAGNOSIS — Z4733 Aftercare following explantation of knee joint prosthesis: Secondary | ICD-10-CM | POA: Diagnosis not present

## 2015-04-17 DIAGNOSIS — Z6841 Body Mass Index (BMI) 40.0 and over, adult: Secondary | ICD-10-CM | POA: Diagnosis not present

## 2015-04-17 DIAGNOSIS — Z8571 Personal history of Hodgkin lymphoma: Secondary | ICD-10-CM | POA: Diagnosis not present

## 2015-04-17 DIAGNOSIS — Z4733 Aftercare following explantation of knee joint prosthesis: Secondary | ICD-10-CM | POA: Diagnosis not present

## 2015-04-17 DIAGNOSIS — M069 Rheumatoid arthritis, unspecified: Secondary | ICD-10-CM | POA: Diagnosis not present

## 2015-04-17 DIAGNOSIS — Z96653 Presence of artificial knee joint, bilateral: Secondary | ICD-10-CM | POA: Diagnosis not present

## 2015-04-19 DIAGNOSIS — Z6841 Body Mass Index (BMI) 40.0 and over, adult: Secondary | ICD-10-CM | POA: Diagnosis not present

## 2015-04-19 DIAGNOSIS — M069 Rheumatoid arthritis, unspecified: Secondary | ICD-10-CM | POA: Diagnosis not present

## 2015-04-19 DIAGNOSIS — Z4733 Aftercare following explantation of knee joint prosthesis: Secondary | ICD-10-CM | POA: Diagnosis not present

## 2015-04-19 DIAGNOSIS — Z96653 Presence of artificial knee joint, bilateral: Secondary | ICD-10-CM | POA: Diagnosis not present

## 2015-04-19 DIAGNOSIS — Z8571 Personal history of Hodgkin lymphoma: Secondary | ICD-10-CM | POA: Diagnosis not present

## 2015-04-22 DIAGNOSIS — Z96653 Presence of artificial knee joint, bilateral: Secondary | ICD-10-CM | POA: Diagnosis not present

## 2015-04-22 DIAGNOSIS — M069 Rheumatoid arthritis, unspecified: Secondary | ICD-10-CM | POA: Diagnosis not present

## 2015-04-22 DIAGNOSIS — Z4733 Aftercare following explantation of knee joint prosthesis: Secondary | ICD-10-CM | POA: Diagnosis not present

## 2015-04-22 DIAGNOSIS — Z6841 Body Mass Index (BMI) 40.0 and over, adult: Secondary | ICD-10-CM | POA: Diagnosis not present

## 2015-04-22 DIAGNOSIS — Z8571 Personal history of Hodgkin lymphoma: Secondary | ICD-10-CM | POA: Diagnosis not present

## 2015-04-23 DIAGNOSIS — Z471 Aftercare following joint replacement surgery: Secondary | ICD-10-CM | POA: Diagnosis not present

## 2015-04-23 DIAGNOSIS — Z96652 Presence of left artificial knee joint: Secondary | ICD-10-CM | POA: Diagnosis not present

## 2015-04-24 DIAGNOSIS — Z6841 Body Mass Index (BMI) 40.0 and over, adult: Secondary | ICD-10-CM | POA: Diagnosis not present

## 2015-04-24 DIAGNOSIS — Z4733 Aftercare following explantation of knee joint prosthesis: Secondary | ICD-10-CM | POA: Diagnosis not present

## 2015-04-24 DIAGNOSIS — M069 Rheumatoid arthritis, unspecified: Secondary | ICD-10-CM | POA: Diagnosis not present

## 2015-04-24 DIAGNOSIS — Z8571 Personal history of Hodgkin lymphoma: Secondary | ICD-10-CM | POA: Diagnosis not present

## 2015-04-24 DIAGNOSIS — Z96653 Presence of artificial knee joint, bilateral: Secondary | ICD-10-CM | POA: Diagnosis not present

## 2015-04-26 ENCOUNTER — Other Ambulatory Visit: Payer: Self-pay | Admitting: Family

## 2015-04-26 ENCOUNTER — Encounter: Payer: Self-pay | Admitting: Family

## 2015-04-26 ENCOUNTER — Ambulatory Visit (HOSPITAL_BASED_OUTPATIENT_CLINIC_OR_DEPARTMENT_OTHER): Payer: Medicare Other

## 2015-04-26 ENCOUNTER — Other Ambulatory Visit (HOSPITAL_BASED_OUTPATIENT_CLINIC_OR_DEPARTMENT_OTHER): Payer: Medicare Other

## 2015-04-26 ENCOUNTER — Ambulatory Visit (HOSPITAL_BASED_OUTPATIENT_CLINIC_OR_DEPARTMENT_OTHER): Payer: Medicare Other | Admitting: Family

## 2015-04-26 VITALS — BP 120/83 | HR 110 | Temp 98.2°F | Resp 20

## 2015-04-26 VITALS — BP 98/55 | HR 91 | Temp 97.5°F | Resp 100

## 2015-04-26 DIAGNOSIS — M069 Rheumatoid arthritis, unspecified: Secondary | ICD-10-CM

## 2015-04-26 DIAGNOSIS — Z8572 Personal history of non-Hodgkin lymphomas: Secondary | ICD-10-CM | POA: Diagnosis not present

## 2015-04-26 DIAGNOSIS — C8580 Other specified types of non-Hodgkin lymphoma, unspecified site: Secondary | ICD-10-CM | POA: Diagnosis not present

## 2015-04-26 DIAGNOSIS — Z8571 Personal history of Hodgkin lymphoma: Secondary | ICD-10-CM | POA: Diagnosis not present

## 2015-04-26 DIAGNOSIS — C8590 Non-Hodgkin lymphoma, unspecified, unspecified site: Secondary | ICD-10-CM

## 2015-04-26 DIAGNOSIS — Z96653 Presence of artificial knee joint, bilateral: Secondary | ICD-10-CM | POA: Diagnosis not present

## 2015-04-26 DIAGNOSIS — D631 Anemia in chronic kidney disease: Secondary | ICD-10-CM | POA: Diagnosis not present

## 2015-04-26 DIAGNOSIS — D509 Iron deficiency anemia, unspecified: Secondary | ICD-10-CM

## 2015-04-26 DIAGNOSIS — R531 Weakness: Secondary | ICD-10-CM | POA: Diagnosis not present

## 2015-04-26 DIAGNOSIS — N182 Chronic kidney disease, stage 2 (mild): Secondary | ICD-10-CM

## 2015-04-26 DIAGNOSIS — Z6841 Body Mass Index (BMI) 40.0 and over, adult: Secondary | ICD-10-CM | POA: Diagnosis not present

## 2015-04-26 DIAGNOSIS — D638 Anemia in other chronic diseases classified elsewhere: Secondary | ICD-10-CM

## 2015-04-26 DIAGNOSIS — Z4733 Aftercare following explantation of knee joint prosthesis: Secondary | ICD-10-CM | POA: Diagnosis not present

## 2015-04-26 LAB — CBC WITH DIFFERENTIAL (CANCER CENTER ONLY)
BASO#: 0 10*3/uL (ref 0.0–0.2)
BASO%: 0.2 % (ref 0.0–2.0)
EOS%: 1.9 % (ref 0.0–7.0)
Eosinophils Absolute: 0.1 10*3/uL (ref 0.0–0.5)
HCT: 30.1 % — ABNORMAL LOW (ref 34.8–46.6)
HGB: 10 g/dL — ABNORMAL LOW (ref 11.6–15.9)
LYMPH#: 1.4 10*3/uL (ref 0.9–3.3)
LYMPH%: 21 % (ref 14.0–48.0)
MCH: 35.8 pg — ABNORMAL HIGH (ref 26.0–34.0)
MCHC: 33.2 g/dL (ref 32.0–36.0)
MCV: 108 fL — ABNORMAL HIGH (ref 81–101)
MONO#: 0.4 10*3/uL (ref 0.1–0.9)
MONO%: 6.6 % (ref 0.0–13.0)
NEUT#: 4.6 10*3/uL (ref 1.5–6.5)
NEUT%: 70.3 % (ref 39.6–80.0)
Platelets: 209 10*3/uL (ref 145–400)
RBC: 2.79 10*6/uL — ABNORMAL LOW (ref 3.70–5.32)
RDW: 13.3 % (ref 11.1–15.7)
WBC: 6.5 10*3/uL (ref 3.9–10.0)

## 2015-04-26 LAB — COMPREHENSIVE METABOLIC PANEL (CC13)
ALT: 10 U/L (ref 0–55)
AST: 16 U/L (ref 5–34)
Albumin: 3.6 g/dL (ref 3.5–5.0)
Alkaline Phosphatase: 76 U/L (ref 40–150)
Anion Gap: 12 mEq/L — ABNORMAL HIGH (ref 3–11)
BUN: 21.9 mg/dL (ref 7.0–26.0)
CO2: 23 mEq/L (ref 22–29)
Calcium: 10 mg/dL (ref 8.4–10.4)
Chloride: 105 mEq/L (ref 98–109)
Creatinine: 0.9 mg/dL (ref 0.6–1.1)
EGFR: 83 mL/min/{1.73_m2} — ABNORMAL LOW (ref >90)
Glucose: 100 mg/dl (ref 70–140)
Potassium: 4.1 mEq/L (ref 3.5–5.1)
Sodium: 140 mEq/L (ref 136–145)
Total Bilirubin: <0.3 mg/dL (ref 0.20–1.20)
Total Protein: 7 g/dL (ref 6.4–8.3)

## 2015-04-26 LAB — CHCC SATELLITE - SMEAR

## 2015-04-26 MED ORDER — FERUMOXYTOL INJECTION 510 MG/17 ML
510.0000 mg | Freq: Once | INTRAVENOUS | Status: DC
  Filled 2015-04-26: qty 17

## 2015-04-26 MED ORDER — SODIUM CHLORIDE 0.9 % IV SOLN
510.0000 mg | Freq: Once | INTRAVENOUS | Status: AC
  Administered 2015-04-26: 510 mg via INTRAVENOUS
  Filled 2015-04-26: qty 17

## 2015-04-26 NOTE — Patient Instructions (Signed)

## 2015-04-26 NOTE — Progress Notes (Signed)
Hematology and Oncology Follow Up Visit  Helen Hardy [id number] 07/17/1950 64 y.o. 04/26/2015   Principle Diagnosis:  1. 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 Rheumatoid arthritis 3. Anemia of chronic disease  Current Therapy:   Observation    Interim History:  Helen Hardy is here today for a follow-up. She states that she is feeling tired and still a little weak since her left knee replacement on October 3rd. She has had a few dizzy spells and her appetite is decreased. Her surgery went well and she is now receiving PT at home 3 days a week. Her incision is healing nicely. The dressing dry and intact. No redness or edema.  She has some swelling around the left knee after PT. She elevates her leg and this resolves.  She has some numbness and tingling in her toes at times that comes and goes.  She has had some mild SOB with exertion that passes if she stops and takes a rest.  She had no issues with bleeding or bruising. No lymphadenopathy found on exam.  She denies fever, chills, n/v, cough, rash, dizziness, chest pain, palpitations, abdominal pain or changes in bowel or bladder habits. She drinks prune juice daily to help prevent constipation.  She is still eating despite a decrease in appetite and she is staying hydrated.  Medications:    Medication List       This list is accurate as of: 04/26/15  1:02 PM.  Always use your most recent med list.               aspirin 325 MG EC tablet  Take 1 tablet (325 mg total) by mouth 2 (two) times daily.     cyclobenzaprine 10 MG tablet  Commonly known as:  FLEXERIL  Take 0.5 tablets (5 mg total) by mouth 3 (three) times daily as needed for muscle spasms.     docusate sodium 100 MG capsule  Commonly known as:  COLACE  Take 1 capsule (100 mg total) by mouth 2 (two) times daily.     estradiol 1 MG tablet  Commonly known as:  ESTRACE  Take 1 mg by mouth daily.     ferrous sulfate 325 (65 FE)  MG tablet  Take 1 tablet (325 mg total) by mouth 3 (three) times daily after meals.     folic acid 740 MCG tablet  Commonly known as:  FOLVITE  Take 800 mcg by mouth 2 (two) times daily.     HYDROcodone-acetaminophen 7.5-325 MG tablet  Commonly known as:  NORCO  Take 1-2 tablets by mouth every 4 (four) hours as needed for moderate pain.     multivitamin tablet  Take 1 tablet by mouth daily.     pantoprazole 40 MG tablet  Commonly known as:  PROTONIX  Take 40 mg by mouth 2 (two) times daily.     polyethylene glycol packet  Commonly known as:  MIRALAX / GLYCOLAX  Take 17 g by mouth 2 (two) times daily.     predniSONE 5 MG tablet  Commonly known as:  DELTASONE  Take 5 mg by mouth daily with breakfast.     triamcinolone cream 0.1 %  Commonly known as:  KENALOG  Apply 1 application topically daily as needed (break out on neck from heat).     valsartan-hydrochlorothiazide 160-25 MG tablet  Commonly known as:  DIOVAN-HCT  Take 1 tablet by mouth daily.     vitamin B-12 1000 MCG tablet  Commonly  known as:  CYANOCOBALAMIN  Take 1,000 mcg by mouth daily.     Vitamin D 2000 UNITS Caps  Take 2,000 Units by mouth 2 (two) times daily.        Allergies:  Allergies  Allergen Reactions  . Oxycodone Nausea And Vomiting    Past Medical History, Surgical history, Social history, and Family History were reviewed and updated.  Review of Systems: All other 10 point review of systems is negative.   Physical Exam:  oral temperature is 98.2 F (36.8 C). Her blood pressure is 120/83 and her pulse is 110. Her respiration is 20 and oxygen saturation is 100%.   Wt Readings from Last 3 Encounters:  04/08/15 240 lb (108.863 kg)  03/29/15 240 lb (108.863 kg)  03/21/15 238 lb (107.956 kg)    Ocular: Sclerae unicteric, pupils equal, round and reactive to light Ear-nose-throat: Oropharynx clear, dentition fair Lymphatic: No cervical or supraclavicular adenopathy Lungs no rales or  rhonchi, good excursion bilaterally Heart regular rate and rhythm, no murmur appreciated Abd soft, nontender, positive bowel sounds MSK no focal spinal tenderness, no joint edema Neuro: non-focal, well-oriented, appropriate affect Breasts: Deferred  Lab Results  Component Value Date   WBC 6.5 04/26/2015   HGB 10.0* 04/26/2015   HCT 30.1* 04/26/2015   MCV 108* 04/26/2015   PLT 209 04/26/2015   Lab Results  Component Value Date   FERRITIN 340* 03/21/2015   IRON 84 03/21/2015   TIBC 283 03/21/2015   UIBC 198 03/21/2015   IRONPCTSAT 30 03/21/2015   Lab Results  Component Value Date   RETICCTPCT 1.6 03/21/2015   RBC 2.79* 04/26/2015   RETICCTABS 48.8 03/21/2015   No results found for: KPAFRELGTCHN, LAMBDASER, KAPLAMBRATIO No results found for: IGGSERUM, IGA, IGMSERUM No results found for: Odetta Pink, SPEI   Chemistry      Component Value Date/Time   NA 141 04/10/2015 0600   NA 143 03/21/2015 0943   K 4.5 04/10/2015 0600   K 3.7 03/21/2015 0943   CL 111 04/10/2015 0600   CO2 27 04/10/2015 0600   CO2 25 03/21/2015 0943   BUN 17 04/10/2015 0600   BUN 24.2 03/21/2015 0943   CREATININE 0.57 04/10/2015 0600   CREATININE 0.9 03/21/2015 0943      Component Value Date/Time   CALCIUM 8.2* 04/10/2015 0600   CALCIUM 9.9 03/21/2015 0943   ALKPHOS 60 03/21/2015 0943   ALKPHOS 56 11/16/2014 1033   AST 19 03/21/2015 0943   AST 17 11/16/2014 1033   ALT 16 03/21/2015 0943   ALT 13 11/16/2014 1033   BILITOT 0.39 03/21/2015 0943   BILITOT 0.4 11/16/2014 1033     Impression and Plan: Helen Hardy is 64 yo African [place] female with past history of low-grade non-Hodgkin's lymphoma. She completed treatment over 13 years ago and so far there has been no evidence of recurrence.  She had a left knee replacement on October 3rd and has felt weak for the last week or so. She has a a couple dizzy spells without falls.  Her Hgb  today is 10.0. We will go ahead and give her a dose of Feraheme while she is here.  She has been taking oral iron TID but is on protonix which blocks the absorption of this. She can stop the PO iron and we will continue to replace it IV as needed.  We will plan to see her back in 6 weeks for labs and follow-up.  She will contact us with any questions or concerns. We can certainly see her sooner if need be.   Eliezer Bottom, NP 10/21/20161:02 PM

## 2015-04-29 ENCOUNTER — Telehealth: Payer: Self-pay | Admitting: Emergency Medicine

## 2015-04-29 DIAGNOSIS — Z6841 Body Mass Index (BMI) 40.0 and over, adult: Secondary | ICD-10-CM | POA: Diagnosis not present

## 2015-04-29 DIAGNOSIS — Z4733 Aftercare following explantation of knee joint prosthesis: Secondary | ICD-10-CM | POA: Diagnosis not present

## 2015-04-29 DIAGNOSIS — Z8571 Personal history of Hodgkin lymphoma: Secondary | ICD-10-CM | POA: Diagnosis not present

## 2015-04-29 DIAGNOSIS — M069 Rheumatoid arthritis, unspecified: Secondary | ICD-10-CM | POA: Diagnosis not present

## 2015-04-29 DIAGNOSIS — Z96653 Presence of artificial knee joint, bilateral: Secondary | ICD-10-CM | POA: Diagnosis not present

## 2015-04-29 LAB — ERYTHROPOIETIN: Erythropoietin: 118.2 m[IU]/mL — ABNORMAL HIGH (ref 2.6–18.5)

## 2015-04-29 LAB — IRON AND TIBC CHCC
%SAT: 24 % (ref 21–57)
Iron: 61 ug/dL (ref 41–142)
TIBC: 253 ug/dL (ref 236–444)
UIBC: 192 ug/dL (ref 120–384)

## 2015-04-29 LAB — FERRITIN CHCC: Ferritin: 491 ng/ml — ABNORMAL HIGH (ref 9–269)

## 2015-04-29 LAB — RETICULOCYTES (CHCC)
ABS Retic: 59.2 10*3/uL (ref 19.0–186.0)
RBC.: 2.82 MIL/uL — ABNORMAL LOW (ref 3.87–5.11)
Retic Ct Pct: 2.1 % (ref 0.4–2.3)

## 2015-04-29 NOTE — Telephone Encounter (Signed)
Patient called to state that she had left side facial swelling onset yesterday. States swelling has improved today. Denies any other complaints or symptoms. Advised patient to call for any worsening symptoms. Patient verbalized understanding.

## 2015-05-01 DIAGNOSIS — M069 Rheumatoid arthritis, unspecified: Secondary | ICD-10-CM | POA: Diagnosis not present

## 2015-05-01 DIAGNOSIS — Z4733 Aftercare following explantation of knee joint prosthesis: Secondary | ICD-10-CM | POA: Diagnosis not present

## 2015-05-01 DIAGNOSIS — Z6841 Body Mass Index (BMI) 40.0 and over, adult: Secondary | ICD-10-CM | POA: Diagnosis not present

## 2015-05-01 DIAGNOSIS — Z8571 Personal history of Hodgkin lymphoma: Secondary | ICD-10-CM | POA: Diagnosis not present

## 2015-05-01 DIAGNOSIS — Z96653 Presence of artificial knee joint, bilateral: Secondary | ICD-10-CM | POA: Diagnosis not present

## 2015-05-03 DIAGNOSIS — M0579 Rheumatoid arthritis with rheumatoid factor of multiple sites without organ or systems involvement: Secondary | ICD-10-CM | POA: Diagnosis not present

## 2015-05-03 DIAGNOSIS — G629 Polyneuropathy, unspecified: Secondary | ICD-10-CM | POA: Diagnosis not present

## 2015-05-06 DIAGNOSIS — M1712 Unilateral primary osteoarthritis, left knee: Secondary | ICD-10-CM | POA: Diagnosis not present

## 2015-05-09 DIAGNOSIS — M1712 Unilateral primary osteoarthritis, left knee: Secondary | ICD-10-CM | POA: Diagnosis not present

## 2015-05-13 DIAGNOSIS — M1712 Unilateral primary osteoarthritis, left knee: Secondary | ICD-10-CM | POA: Diagnosis not present

## 2015-05-16 DIAGNOSIS — M1712 Unilateral primary osteoarthritis, left knee: Secondary | ICD-10-CM | POA: Diagnosis not present

## 2015-05-20 DIAGNOSIS — M1712 Unilateral primary osteoarthritis, left knee: Secondary | ICD-10-CM | POA: Diagnosis not present

## 2015-05-21 DIAGNOSIS — M0589 Other rheumatoid arthritis with rheumatoid factor of multiple sites: Secondary | ICD-10-CM | POA: Diagnosis not present

## 2015-05-23 DIAGNOSIS — M1712 Unilateral primary osteoarthritis, left knee: Secondary | ICD-10-CM | POA: Diagnosis not present

## 2015-05-24 ENCOUNTER — Other Ambulatory Visit: Payer: Self-pay

## 2015-05-24 DIAGNOSIS — Z1231 Encounter for screening mammogram for malignant neoplasm of breast: Secondary | ICD-10-CM

## 2015-05-27 DIAGNOSIS — M1712 Unilateral primary osteoarthritis, left knee: Secondary | ICD-10-CM | POA: Diagnosis not present

## 2015-05-29 DIAGNOSIS — M25561 Pain in right knee: Secondary | ICD-10-CM | POA: Diagnosis not present

## 2015-05-29 DIAGNOSIS — Z96652 Presence of left artificial knee joint: Secondary | ICD-10-CM | POA: Diagnosis not present

## 2015-05-29 DIAGNOSIS — Z471 Aftercare following joint replacement surgery: Secondary | ICD-10-CM | POA: Diagnosis not present

## 2015-05-29 DIAGNOSIS — M25562 Pain in left knee: Secondary | ICD-10-CM | POA: Diagnosis not present

## 2015-06-03 DIAGNOSIS — M1712 Unilateral primary osteoarthritis, left knee: Secondary | ICD-10-CM | POA: Diagnosis not present

## 2015-06-07 ENCOUNTER — Other Ambulatory Visit: Payer: Medicare Other

## 2015-06-07 ENCOUNTER — Ambulatory Visit: Payer: Medicare Other

## 2015-06-07 ENCOUNTER — Ambulatory Visit: Payer: Medicare Other | Admitting: Hematology & Oncology

## 2015-06-10 DIAGNOSIS — M1712 Unilateral primary osteoarthritis, left knee: Secondary | ICD-10-CM | POA: Diagnosis not present

## 2015-06-13 DIAGNOSIS — M1712 Unilateral primary osteoarthritis, left knee: Secondary | ICD-10-CM | POA: Diagnosis not present

## 2015-06-16 ENCOUNTER — Encounter (HOSPITAL_COMMUNITY): Payer: Self-pay | Admitting: *Deleted

## 2015-06-16 ENCOUNTER — Emergency Department (HOSPITAL_COMMUNITY): Payer: Medicare Other

## 2015-06-16 ENCOUNTER — Emergency Department (HOSPITAL_COMMUNITY)
Admission: EM | Admit: 2015-06-16 | Discharge: 2015-06-16 | Disposition: A | Discharge status: Home / Self Care | Payer: Medicare Other | Attending: Emergency Medicine | Admitting: Emergency Medicine

## 2015-06-16 DIAGNOSIS — Z8571 Personal history of Hodgkin lymphoma: Secondary | ICD-10-CM | POA: Diagnosis not present

## 2015-06-16 DIAGNOSIS — Z79818 Long term (current) use of other agents affecting estrogen receptors and estrogen levels: Secondary | ICD-10-CM | POA: Diagnosis not present

## 2015-06-16 DIAGNOSIS — J029 Acute pharyngitis, unspecified: Secondary | ICD-10-CM | POA: Diagnosis present

## 2015-06-16 DIAGNOSIS — M069 Rheumatoid arthritis, unspecified: Secondary | ICD-10-CM | POA: Insufficient documentation

## 2015-06-16 DIAGNOSIS — Z8701 Personal history of pneumonia (recurrent): Secondary | ICD-10-CM | POA: Insufficient documentation

## 2015-06-16 DIAGNOSIS — I10 Essential (primary) hypertension: Secondary | ICD-10-CM | POA: Insufficient documentation

## 2015-06-16 DIAGNOSIS — K219 Gastro-esophageal reflux disease without esophagitis: Secondary | ICD-10-CM | POA: Diagnosis not present

## 2015-06-16 DIAGNOSIS — J069 Acute upper respiratory infection, unspecified: Secondary | ICD-10-CM | POA: Diagnosis not present

## 2015-06-16 DIAGNOSIS — Z7952 Long term (current) use of systemic steroids: Secondary | ICD-10-CM | POA: Diagnosis not present

## 2015-06-16 DIAGNOSIS — R05 Cough: Secondary | ICD-10-CM | POA: Diagnosis not present

## 2015-06-16 DIAGNOSIS — D649 Anemia, unspecified: Secondary | ICD-10-CM | POA: Insufficient documentation

## 2015-06-16 DIAGNOSIS — Z79899 Other long term (current) drug therapy: Secondary | ICD-10-CM | POA: Diagnosis not present

## 2015-06-16 LAB — RAPID STREP SCREEN (MED CTR MEBANE ONLY): Streptococcus, Group A Screen (Direct): NEGATIVE

## 2015-06-16 MED ORDER — AZITHROMYCIN 250 MG PO TABS: 250.0000 mg | ORAL_TABLET | Freq: Every day | ORAL | Status: DC

## 2015-06-16 NOTE — ED Notes (Signed)
Pt states that she was out in the wind and the cold on Thurs after physical therapy; pt states that she has had cough, congestion, sore throat and chills since then; pt states that "I just feel bad"; pt reports productive cough with yellow phlegm

## 2015-06-16 NOTE — Discharge Instructions (Signed)
Upper Respiratory Infection, Adult Most upper respiratory infections (URIs) are a viral infection of the air passages leading to the lungs. A URI affects the nose, throat, and upper air passages. The most common type of URI is nasopharyngitis and is typically referred to as "the common cold." URIs run their course and usually go away on their own. Most of the time, a URI does not require medical attention, but sometimes a bacterial infection in the upper airways can follow a viral infection. This is called a secondary infection. Sinus and middle ear infections are common types of secondary upper respiratory infections. Bacterial pneumonia can also complicate a URI. A URI can worsen asthma and chronic obstructive pulmonary disease (COPD). Sometimes, these complications can require emergency medical care and may be life threatening.  CAUSES Almost all URIs are caused by viruses. A virus is a type of germ and can spread from one person to another.  RISKS FACTORS You may be at risk for a URI if:   You smoke.   You have chronic heart or lung disease.  You have a weakened defense (immune) system.   You are very young or very old.   You have nasal allergies or asthma.  You work in crowded or poorly ventilated areas.  You work in health care facilities or schools. SIGNS AND SYMPTOMS  Symptoms typically develop 2-3 days after you come in contact with a cold virus. Most viral URIs last 7-10 days. However, viral URIs from the influenza virus (flu virus) can last 14-18 days and are typically more severe. Symptoms may include:   Runny or stuffy (congested) nose.   Sneezing.   Cough.   Sore throat.   Headache.   Fatigue.   Fever.   Loss of appetite.   Pain in your forehead, behind your eyes, and over your cheekbones (sinus pain).  Muscle aches.  DIAGNOSIS  Your health care provider may diagnose a URI by:  Physical exam.  Tests to check that your symptoms are not due to  another condition such as:  Strep throat.  Sinusitis.  Pneumonia.  Asthma. TREATMENT  A URI goes away on its own with time. It cannot be cured with medicines, but medicines may be prescribed or recommended to relieve symptoms. Medicines may help:  Reduce your fever.  Reduce your cough.  Relieve nasal congestion. HOME CARE INSTRUCTIONS   Take medicines only as directed by your health care provider.   Gargle warm saltwater or take cough drops to comfort your throat as directed by your health care provider.  Use a warm mist humidifier or inhale steam from a shower to increase air moisture. This may make it easier to breathe.  Drink enough fluid to keep your urine clear or pale yellow.   Eat soups and other clear broths and maintain good nutrition.   Rest as needed.   Return to work when your temperature has returned to normal or as your health care provider advises. You may need to stay home longer to avoid infecting others. You can also use a face mask and careful hand washing to prevent spread of the virus.  Increase the usage of your inhaler if you have asthma.   Do not use any tobacco products, including cigarettes, chewing tobacco, or electronic cigarettes. If you need help quitting, ask your health care provider. PREVENTION  The best way to protect yourself from getting a cold is to practice good hygiene.   Avoid oral or hand contact with people with cold   symptoms.   Wash your hands often if contact occurs.  There is no clear evidence that vitamin C, vitamin E, echinacea, or exercise reduces the chance of developing a cold. However, it is always recommended to get plenty of rest, exercise, and practice good nutrition.  SEEK MEDICAL CARE IF:   You are getting worse rather than better.   Your symptoms are not controlled by medicine.   You have chills.  You have worsening shortness of breath.  You have brown or red mucus.  You have yellow or brown nasal  discharge.  You have pain in your face, especially when you bend forward.  You have a fever.  You have swollen neck glands.  You have pain while swallowing.  You have white areas in the back of your throat. SEEK IMMEDIATE MEDICAL CARE IF:   You have severe or persistent:  Headache.  Ear pain.  Sinus pain.  Chest pain.  You have chronic lung disease and any of the following:  Wheezing.  Prolonged cough.  Coughing up blood.  A change in your usual mucus.  You have a stiff neck.  You have changes in your:  Vision.  Hearing.  Thinking.  Mood. MAKE SURE YOU:   Understand these instructions.  Will watch your condition.  Will get help right away if you are not doing well or get worse.   This information is not intended to replace advice given to you by your health care provider. Make sure you discuss any questions you have with your health care provider.   Document Released: 12/16/2000 Document Revised: 11/06/2014 Document Reviewed: 09/27/2013 Elsevier Interactive Patient Education 2016 [employer]  

## 2015-06-16 NOTE — ED Provider Notes (Signed)
CSN: [id number]     Arrival date & time 06/16/15  7893 History   First MD Initiated Contact with Patient 06/16/15 0606     Chief Complaint  Patient presents with  . URI  . Sore Throat     (Consider location/radiation/quality/duration/timing/severity/associated sxs/prior Treatment) HPI  Patient to the ER with complaints of not feeling well. She reports not dressing well after exercise and going outside sweaty in 20 degree windy weather. She has felt bad ever since, this took place on Thursday. She reports staying at home Friday and Saturday without doing much due to feeling bad.  She has cough, ear pain, throat pain. Her cough is dry but sometimes with yellow phlegm. She has tried many OTC medications (aspirin, tea, cough medications) including prescription hydrocodone. She has not had fever, SOB, lethargy, confusion, weakness, diaphoresis, rash, lower extremity swelling,N/V/D, or new pains.  Past Medical History  Diagnosis Date  . Arthritis     Rheumatoid  . Cancer ([place])     Hodkins Lymphoma  . Hypertension   . Pneumonia   . GERD (gastroesophageal reflux disease)   . Anemia    Past Surgical History  Procedure Laterality Date  . Rotator cuff repair    . Joint replacement      both knee replacement  . Bladder tack - 2005    . Abdominal hysterectomy    . 2012 right big toe and toe beside - had pins in toes    . Eye surgery      cataract  . Total knee revision Left 04/08/2015    Procedure: REVISION LEFT TOTAL KNEE ;  Surgeon: Paralee Cancel, MD;  Location: [facility];  Service: Orthopedics;  Laterality: Left;   No family history on file. Social History  Substance Use Topics  . Smoking status: Never Smoker   . Smokeless tobacco: Never Used  . Alcohol Use: No   OB History    No data available     Review of Systems  Refer to HPI for pertinent positive and negative ROS. Otherwise all other review of systems are negative for this patient encounter.   Allergies   Oxycodone  Home Medications   Prior to Admission medications   Medication Sig Start Date End Date Taking? Authorizing Provider  azithromycin (ZITHROMAX) 250 MG tablet Take 1 tablet (250 mg total) by mouth daily. Take first 2 tablets together, then 1 every day until finished. 06/16/15   Delos Haring, PA-C  Cholecalciferol (VITAMIN D) 2000 UNITS CAPS Take 2,000 Units by mouth 2 (two) times daily.    Historical Provider, MD  cyclobenzaprine (FLEXERIL) 10 MG tablet Take 0.5 tablets (5 mg total) by mouth 3 (three) times daily as needed for muscle spasms. 04/10/15   Danae Orleans, PA-C  docusate sodium (COLACE) 100 MG capsule Take 1 capsule (100 mg total) by mouth 2 (two) times daily. 04/10/15   Danae Orleans, PA-C  estradiol (ESTRACE) 1 MG tablet Take 1 mg by mouth daily.  12/25/12   Historical Provider, MD  ferrous sulfate 325 (65 FE) MG tablet Take 1 tablet (325 mg total) by mouth 3 (three) times daily after meals. 04/10/15   Danae Orleans, PA-C  folic acid (FOLVITE) 810 MCG tablet Take 800 mcg by mouth 2 (two) times daily.    Historical Provider, MD  HYDROcodone-acetaminophen (NORCO) 7.5-325 MG tablet Take 1-2 tablets by mouth every 4 (four) hours as needed for moderate pain. 04/10/15   Danae Orleans, PA-C  Multiple Vitamin (MULTIVITAMIN) tablet Take 1 tablet  by mouth daily.    Historical Provider, MD  pantoprazole (PROTONIX) 40 MG tablet Take 40 mg by mouth 2 (two) times daily.     Historical Provider, MD  polyethylene glycol (MIRALAX / GLYCOLAX) packet Take 17 g by mouth 2 (two) times daily. 04/10/15   Danae Orleans, PA-C  predniSONE (DELTASONE) 5 MG tablet Take 5 mg by mouth daily with breakfast.  10/29/14   Historical Provider, MD  triamcinolone cream (KENALOG) 0.1 % Apply 1 application topically daily as needed (break out on neck from heat).  10/29/14   Historical Provider, MD  valsartan-hydrochlorothiazide (DIOVAN-HCT) 160-25 MG per tablet Take 1 tablet by mouth daily.     Historical  Provider, MD  vitamin B-12 (CYANOCOBALAMIN) 1000 MCG tablet Take 1,000 mcg by mouth daily.    Historical Provider, MD   BP 121/75 mmHg  Pulse 78  Temp(Src) 98.3 F (36.8 C) (Oral)  Resp 20  SpO2 98% Physical Exam  Constitutional: She appears well-developed and well-nourished. She does not appear ill. No distress.  Well-appearing  HENT:  Head: Normocephalic and atraumatic.  Right Ear: Tympanic membrane and ear canal normal.  Left Ear: Tympanic membrane and ear canal normal.  Nose: Nose normal.  Mouth/Throat: Posterior oropharyngeal erythema present. No oropharyngeal exudate or posterior oropharyngeal edema.  Eyes: Pupils are equal, round, and reactive to light.  Neck: Normal range of motion. Neck supple.  Cardiovascular: Normal rate and regular rhythm.   Pulmonary/Chest: Effort normal. She has no decreased breath sounds. She has no wheezes. She has rhonchi (mild left lower lobe). She has no rales.  Abdominal: Soft.  Musculoskeletal:  No LE swelling  Neurological: She is alert.  Skin: Skin is warm and dry.  Nursing note and vitals reviewed.   ED Course  Procedures (including critical care time) Labs Review Labs Reviewed  RAPID STREP SCREEN (NOT AT [facility])  CULTURE, GROUP A STREP    Imaging Review Dg Chest 2 View  06/16/2015  CLINICAL DATA:  Cough and congestion for 10 days. History of Hodgkin's lymphoma EXAM: CHEST  2 VIEW COMPARISON:  May 12, 2009 FINDINGS: The degree of inspiration is shallow. There is no edema or consolidation. Heart size and pulmonary vascularity are normal. No adenopathy. There is degenerative change in the thoracic spine. IMPRESSION: No edema or consolidation.  No demonstrable adenopathy. Electronically Signed   By: Lowella Grip III M.D.   On: 06/16/2015 07:05   I have personally reviewed and evaluated these images and lab results as part of my medical decision-making.   EKG Interpretation None      MDM   Final diagnoses:  URI (upper  respiratory infection)    Patient has hx of Hodkins lymphoma and recent knee replacement (oct 2016) who is going through rehab. She has hypertension, hx of pneumonia, anemia, rheumatoid arthritis on intermittent prednisone.  She has had a negative strep screen. Due to cough, physical exam lung findings, co morbidities and feeling poorly I will start patient on antibiotics. She is to follow-up with her PCP. Over all she is well appearing. She has not had recent hospitalizations, rx: Z-pack.  Medications - No data to display   I feel the patient has had an appropriate workup for their chief complaint at this time and likelihood of emergent condition existing is low. Discussed s/sx that warrant return to the ED.  Filed Vitals:   06/16/15 0559  BP: 121/75  Pulse: 78  Temp: 98.3 F (36.8 C)  Resp: 20  Delos Haring, PA-C 06/16/15 [id number]  Orpah Greek, MD 06/16/15 [phone]

## 2015-06-18 DIAGNOSIS — M0579 Rheumatoid arthritis with rheumatoid factor of multiple sites without organ or systems involvement: Secondary | ICD-10-CM | POA: Diagnosis not present

## 2015-06-18 LAB — CULTURE, GROUP A STREP: Strep A Culture: NEGATIVE

## 2015-06-20 ENCOUNTER — Other Ambulatory Visit: Payer: Medicare Other

## 2015-06-20 ENCOUNTER — Ambulatory Visit: Payer: Medicare Other | Admitting: Hematology & Oncology

## 2015-06-21 ENCOUNTER — Ambulatory Visit (HOSPITAL_BASED_OUTPATIENT_CLINIC_OR_DEPARTMENT_OTHER): Payer: Medicare Other | Admitting: Hematology & Oncology

## 2015-06-21 ENCOUNTER — Encounter: Payer: Self-pay | Admitting: Hematology & Oncology

## 2015-06-21 ENCOUNTER — Other Ambulatory Visit (HOSPITAL_BASED_OUTPATIENT_CLINIC_OR_DEPARTMENT_OTHER): Payer: Medicare Other

## 2015-06-21 ENCOUNTER — Ambulatory Visit (HOSPITAL_BASED_OUTPATIENT_CLINIC_OR_DEPARTMENT_OTHER): Payer: Medicare Other

## 2015-06-21 VITALS — BP 101/76 | HR 106 | Temp 97.4°F | Resp 16 | Ht 60.0 in | Wt 234.0 lb

## 2015-06-21 DIAGNOSIS — D508 Other iron deficiency anemias: Secondary | ICD-10-CM

## 2015-06-21 DIAGNOSIS — Z8572 Personal history of non-Hodgkin lymphomas: Secondary | ICD-10-CM

## 2015-06-21 DIAGNOSIS — D509 Iron deficiency anemia, unspecified: Secondary | ICD-10-CM

## 2015-06-21 DIAGNOSIS — K9089 Other intestinal malabsorption: Secondary | ICD-10-CM

## 2015-06-21 DIAGNOSIS — K909 Intestinal malabsorption, unspecified: Secondary | ICD-10-CM

## 2015-06-21 HISTORY — DX: Intestinal malabsorption, unspecified: K90.9

## 2015-06-21 LAB — CMP (CANCER CENTER ONLY)
ALT(SGPT): 15 U/L (ref 10–47)
AST: 20 U/L (ref 11–38)
Albumin: 3.4 g/dL (ref 3.3–5.5)
Alkaline Phosphatase: 54 U/L (ref 26–84)
BUN, Bld: 22 mg/dL (ref 7–22)
CO2: 25 mEq/L (ref 18–33)
Calcium: 9.6 mg/dL (ref 8.0–10.3)
Chloride: 103 mEq/L (ref 98–108)
Creat: 0.7 mg/dl (ref 0.6–1.2)
Glucose, Bld: 108 mg/dL (ref 73–118)
Potassium: 3.6 mEq/L (ref 3.3–4.7)
Sodium: 140 mEq/L (ref 128–145)
Total Bilirubin: 0.6 mg/dl (ref 0.20–1.60)
Total Protein: 6.9 g/dL (ref 6.4–8.1)

## 2015-06-21 LAB — CBC WITH DIFFERENTIAL (CANCER CENTER ONLY)
BASO#: 0 10*3/uL (ref 0.0–0.2)
BASO%: 0.2 % (ref 0.0–2.0)
EOS%: 2.8 % (ref 0.0–7.0)
Eosinophils Absolute: 0.2 10*3/uL (ref 0.0–0.5)
HCT: 30.9 % — ABNORMAL LOW (ref 34.8–46.6)
HGB: 10.3 g/dL — ABNORMAL LOW (ref 11.6–15.9)
LYMPH#: 2.1 10*3/uL (ref 0.9–3.3)
LYMPH%: 38.3 % (ref 14.0–48.0)
MCH: 35.3 pg — ABNORMAL HIGH (ref 26.0–34.0)
MCHC: 33.3 g/dL (ref 32.0–36.0)
MCV: 106 fL — ABNORMAL HIGH (ref 81–101)
MONO#: 0.5 10*3/uL (ref 0.1–0.9)
MONO%: 9.5 % (ref 0.0–13.0)
NEUT#: 2.7 10*3/uL (ref 1.5–6.5)
NEUT%: 49.2 % (ref 39.6–80.0)
Platelets: 177 10*3/uL (ref 145–400)
RBC: 2.92 10*6/uL — ABNORMAL LOW (ref 3.70–5.32)
RDW: 13.8 % (ref 11.1–15.7)
WBC: 5.5 10*3/uL (ref 3.9–10.0)

## 2015-06-21 LAB — IRON AND TIBC
%SAT: 47 % (ref 21–57)
Iron: 107 ug/dL (ref 41–142)
TIBC: 230 ug/dL — ABNORMAL LOW (ref 236–444)
UIBC: 123 ug/dL (ref 120–384)

## 2015-06-21 LAB — FERRITIN: Ferritin: 666 ng/ml — ABNORMAL HIGH (ref 9–269)

## 2015-06-21 LAB — RETICULOCYTES
ABS Retic: 41 10*3/uL (ref 19.0–186.0)
RBC.: 2.93 MIL/uL — ABNORMAL LOW (ref 3.87–5.11)
Retic Ct Pct: 1.4 % (ref 0.4–2.3)

## 2015-06-21 LAB — CHCC SATELLITE - SMEAR

## 2015-06-21 MED ORDER — SODIUM CHLORIDE 0.9 % IV SOLN
Freq: Once | INTRAVENOUS | Status: AC
  Administered 2015-06-21: 09:00:00 via INTRAVENOUS

## 2015-06-21 MED ORDER — SODIUM CHLORIDE 0.9 % IV SOLN
510.0000 mg | Freq: Once | INTRAVENOUS | Status: AC
  Administered 2015-06-21: 510 mg via INTRAVENOUS
  Filled 2015-06-21: qty 17

## 2015-06-21 NOTE — Patient Instructions (Signed)

## 2015-06-21 NOTE — Progress Notes (Signed)
Hematology and Oncology Follow Up Visit  Helen Hardy [id number] [birth year] 64 y.o. 06/21/2015   Principle Diagnosis:  1. 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 Rheumatoid arthritis 3. Anemia of chronic disease 4.  Iron deficiency anemia  Current Therapy:   IV Iron as indicated    Interim History:  Helen Hardy is here today for a follow-up.she's doing pretty well. She iron less than she was here back in October. She had just had her left knee operated on. She still having physical therapy.  She has a little bit of a URI. She is off her medicine for the rheumatoid arthritis washy take some antibiotics for the infection.  She's had no problems with bleeding.  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   She's had no change in bowel or bladder habits.   Her joints do bother her a little bit because she's not being able to take the [place]  Overall, her performance status is ECOG 2.   Medications:    Medication List       This list is accurate as of: 06/21/15  8:43 AM.  Always use your most recent med list.               azithromycin 250 MG tablet  Commonly known as:  ZITHROMAX  Take 1 tablet (250 mg total) by mouth daily. Take first 2 tablets together, then 1 every day until finished.     cyclobenzaprine 10 MG tablet  Commonly known as:  FLEXERIL  Take 0.5 tablets (5 mg total) by mouth 3 (three) times daily as needed for muscle spasms.     docusate sodium 100 MG capsule  Commonly known as:  COLACE  Take 1 capsule (100 mg total) by mouth 2 (two) times daily.     estradiol 1 MG tablet  Commonly known as:  ESTRACE  Take 1 mg by mouth daily.     ferrous sulfate 325 (65 FE) MG tablet  Take 1 tablet (325 mg total) by mouth 3 (three) times daily after meals.     folic acid 834 MCG tablet  Commonly known as:  FOLVITE  Take 800 mcg by mouth 2 (two) times  daily.     HYDROcodone-acetaminophen 7.5-325 MG tablet  Commonly known as:  NORCO  Take 1-2 tablets by mouth every 4 (four) hours as needed for moderate pain.     leflunomide 10 MG tablet  Commonly known as:  ARAVA     multivitamin tablet  Take 1 tablet by mouth daily.     pantoprazole 40 MG tablet  Commonly known as:  PROTONIX  Take 40 mg by mouth 2 (two) times daily.     polyethylene glycol packet  Commonly known as:  MIRALAX / GLYCOLAX  Take 17 g by mouth 2 (two) times daily.     predniSONE 5 MG tablet  Commonly known as:  DELTASONE  Take 5 mg by mouth daily with breakfast.     triamcinolone cream 0.1 %  Commonly known as:  KENALOG  Apply 1 application topically daily as needed (break out on neck from heat).     valsartan-hydrochlorothiazide 160-25 MG tablet  Commonly known as:  DIOVAN-HCT  Take 1 tablet by mouth daily.     vitamin B-12 1000 MCG tablet  Commonly known as:  CYANOCOBALAMIN  Take 1,000 mcg by mouth daily.     Vitamin D 2000 UNITS Caps  Take 2,000 Units by mouth 2 (two) times daily.        Allergies:  Allergies  Allergen Reactions  . Oxycodone Nausea And Vomiting    Past Medical History, Surgical history, Social history, and Family History were reviewed and updated.  Review of Systems: All other 10 point review of systems is negative.   Physical Exam:  height is 5' (1.524 m) and weight is 234 lb (106.142 kg). Her oral temperature is 97.4 F (36.3 C). Her blood pressure is 101/76 and her pulse is 106. Her respiration is 16.   Wt Readings from Last 3 Encounters:  06/21/15 234 lb (106.142 kg)  04/08/15 240 lb (108.863 kg)  03/29/15 240 lb (108.863 kg)    Obese African-American female. Head and neck exam shows no ocular or oral lesions. She has no palpable cervical or supraclavicular lymph nodes. Lungs are clear. Cardiac exam regular rate and rhythm with no murmurs, rubs or bruits. Abdomen is soft. She has good bowel sounds. There is no fluid  wave. There is no palpable liver or spleen tip. Back exam shows no tenderness over the spine, ribs or hips. Extremities shows no clubbing, cyanosis or edema. She is joint changes consistent with her rheumatoid arthritis. Skin exam shows no rashes, ecchymoses or petechia. Neurological exam shows no focal deficits.   Lab Results  Component Value Date   WBC 5.5 06/21/2015   HGB 10.3* 06/21/2015   HCT 30.9* 06/21/2015   MCV 106* 06/21/2015   PLT 177 06/21/2015   Lab Results  Component Value Date   FERRITIN 491* 04/26/2015   IRON 61 04/26/2015   TIBC 253 04/26/2015   UIBC 192 04/26/2015   IRONPCTSAT 24 04/26/2015   Lab Results  Component Value Date   RETICCTPCT 2.1 04/26/2015   RBC 2.92* 06/21/2015   RETICCTABS 59.2 04/26/2015   No results found for: KPAFRELGTCHN, LAMBDASER, KAPLAMBRATIO No results found for: IGGSERUM, IGA, IGMSERUM No results found for: Odetta Pink, SPEI   Chemistry      Component Value Date/Time   NA 140 06/21/2015 0800   NA 140 04/26/2015 1212   NA 141 04/10/2015 0600   K 3.6 06/21/2015 0800   K 4.1 04/26/2015 1212   K 4.5 04/10/2015 0600   CL 103 06/21/2015 0800   CL 111 04/10/2015 0600   CO2 25 06/21/2015 0800   CO2 23 04/26/2015 1212   CO2 27 04/10/2015 0600   BUN 22 06/21/2015 0800   BUN 21.9 04/26/2015 1212   BUN 17 04/10/2015 0600   CREATININE 0.7 06/21/2015 0800   CREATININE 0.9 04/26/2015 1212   CREATININE 0.57 04/10/2015 0600      Component Value Date/Time   CALCIUM 9.6 06/21/2015 0800   CALCIUM 10.0 04/26/2015 1212   CALCIUM 8.2* 04/10/2015 0600   ALKPHOS 54 06/21/2015 0800   ALKPHOS 76 04/26/2015 1212   ALKPHOS 56 11/16/2014 1033   AST 20 06/21/2015 0800   AST 16 04/26/2015 1212   AST 17 11/16/2014 1033   ALT 15 06/21/2015 0800   ALT 10 04/26/2015 1212   ALT 13 11/16/2014 1033   BILITOT 0.60 06/21/2015 0800   BILITOT <0.30 04/26/2015 1212   BILITOT 0.4 11/16/2014 1033      Impression and Plan: Helen Hardy is 64 yo African [place] female with past history of low-grade non-Hodgkin's lymphoma. She completed treatment over 13 years ago and so far there has been no evidence of recurrence.  She had a  left knee replacement on October 3rd.  The iron that we gave her back in October helped.  She has iron malabsorption. As such, we will give her another dose today. I did this will help her out. This will help her quality of life.  We will then plan to get her back in another couple weeks.  We have checked her erythropoietin level. It is 118. We always could consider using Aranesp if we needed to give more of a "boost" with her hemoglobin.   Volanda Napoleon, MD 12/16/20168:43 AM

## 2015-06-24 DIAGNOSIS — K219 Gastro-esophageal reflux disease without esophagitis: Secondary | ICD-10-CM | POA: Diagnosis not present

## 2015-06-24 DIAGNOSIS — M1712 Unilateral primary osteoarthritis, left knee: Secondary | ICD-10-CM | POA: Diagnosis not present

## 2015-06-24 DIAGNOSIS — B001 Herpesviral vesicular dermatitis: Secondary | ICD-10-CM | POA: Diagnosis not present

## 2015-06-27 DIAGNOSIS — M0589 Other rheumatoid arthritis with rheumatoid factor of multiple sites: Secondary | ICD-10-CM | POA: Diagnosis not present

## 2015-06-27 DIAGNOSIS — M1712 Unilateral primary osteoarthritis, left knee: Secondary | ICD-10-CM | POA: Diagnosis not present

## 2015-06-28 DIAGNOSIS — Z961 Presence of intraocular lens: Secondary | ICD-10-CM | POA: Diagnosis not present

## 2015-06-28 DIAGNOSIS — H18413 Arcus senilis, bilateral: Secondary | ICD-10-CM | POA: Diagnosis not present

## 2015-06-28 DIAGNOSIS — H02839 Dermatochalasis of unspecified eye, unspecified eyelid: Secondary | ICD-10-CM | POA: Diagnosis not present

## 2015-06-28 DIAGNOSIS — I10 Essential (primary) hypertension: Secondary | ICD-10-CM | POA: Diagnosis not present

## 2015-07-02 ENCOUNTER — Ambulatory Visit
Admission: RE | Admit: 2015-07-02 | Discharge: 2015-07-02 | Disposition: A | Discharge status: Home / Self Care | Payer: Medicare Other | Source: Ambulatory Visit

## 2015-07-02 DIAGNOSIS — Z1231 Encounter for screening mammogram for malignant neoplasm of breast: Secondary | ICD-10-CM | POA: Diagnosis not present

## 2015-07-03 ENCOUNTER — Other Ambulatory Visit: Payer: Self-pay | Admitting: Obstetrics and Gynecology

## 2015-07-03 DIAGNOSIS — R928 Other abnormal and inconclusive findings on diagnostic imaging of breast: Secondary | ICD-10-CM

## 2015-07-04 DIAGNOSIS — M1712 Unilateral primary osteoarthritis, left knee: Secondary | ICD-10-CM | POA: Diagnosis not present

## 2015-07-10 DIAGNOSIS — Z96652 Presence of left artificial knee joint: Secondary | ICD-10-CM | POA: Diagnosis not present

## 2015-07-10 DIAGNOSIS — Z471 Aftercare following joint replacement surgery: Secondary | ICD-10-CM | POA: Diagnosis not present

## 2015-07-12 ENCOUNTER — Ambulatory Visit
Admission: RE | Admit: 2015-07-12 | Discharge: 2015-07-12 | Disposition: A | Discharge status: Home / Self Care | Payer: Medicare Other | Source: Ambulatory Visit | Attending: Obstetrics and Gynecology | Admitting: Obstetrics and Gynecology

## 2015-07-12 DIAGNOSIS — N63 Unspecified lump in breast: Secondary | ICD-10-CM | POA: Diagnosis not present

## 2015-07-12 DIAGNOSIS — N6011 Diffuse cystic mastopathy of right breast: Secondary | ICD-10-CM | POA: Diagnosis not present

## 2015-07-12 DIAGNOSIS — N6012 Diffuse cystic mastopathy of left breast: Secondary | ICD-10-CM | POA: Diagnosis not present

## 2015-07-12 DIAGNOSIS — R928 Other abnormal and inconclusive findings on diagnostic imaging of breast: Secondary | ICD-10-CM

## 2015-07-25 DIAGNOSIS — M0589 Other rheumatoid arthritis with rheumatoid factor of multiple sites: Secondary | ICD-10-CM | POA: Diagnosis not present

## 2015-08-07 DIAGNOSIS — E559 Vitamin D deficiency, unspecified: Secondary | ICD-10-CM | POA: Diagnosis not present

## 2015-08-07 DIAGNOSIS — I10 Essential (primary) hypertension: Secondary | ICD-10-CM | POA: Diagnosis not present

## 2015-08-07 DIAGNOSIS — Z0000 Encounter for general adult medical examination without abnormal findings: Secondary | ICD-10-CM | POA: Diagnosis not present

## 2015-08-07 DIAGNOSIS — Z23 Encounter for immunization: Secondary | ICD-10-CM | POA: Diagnosis not present

## 2015-08-15 DIAGNOSIS — G629 Polyneuropathy, unspecified: Secondary | ICD-10-CM | POA: Diagnosis not present

## 2015-08-15 DIAGNOSIS — M0579 Rheumatoid arthritis with rheumatoid factor of multiple sites without organ or systems involvement: Secondary | ICD-10-CM | POA: Diagnosis not present

## 2015-08-15 DIAGNOSIS — I10 Essential (primary) hypertension: Secondary | ICD-10-CM | POA: Diagnosis not present

## 2015-08-15 DIAGNOSIS — R7309 Other abnormal glucose: Secondary | ICD-10-CM | POA: Diagnosis not present

## 2015-08-15 DIAGNOSIS — M810 Age-related osteoporosis without current pathological fracture: Secondary | ICD-10-CM | POA: Diagnosis not present

## 2015-08-21 DIAGNOSIS — M0579 Rheumatoid arthritis with rheumatoid factor of multiple sites without organ or systems involvement: Secondary | ICD-10-CM | POA: Diagnosis not present

## 2015-08-21 DIAGNOSIS — G629 Polyneuropathy, unspecified: Secondary | ICD-10-CM | POA: Diagnosis not present

## 2015-08-21 DIAGNOSIS — Z79899 Other long term (current) drug therapy: Secondary | ICD-10-CM | POA: Diagnosis not present

## 2015-08-22 ENCOUNTER — Ambulatory Visit (HOSPITAL_BASED_OUTPATIENT_CLINIC_OR_DEPARTMENT_OTHER): Payer: Medicare Other | Admitting: Hematology & Oncology

## 2015-08-22 ENCOUNTER — Ambulatory Visit: Payer: Medicare Other

## 2015-08-22 ENCOUNTER — Other Ambulatory Visit (HOSPITAL_BASED_OUTPATIENT_CLINIC_OR_DEPARTMENT_OTHER): Payer: Medicare Other

## 2015-08-22 ENCOUNTER — Encounter: Payer: Self-pay | Admitting: Hematology & Oncology

## 2015-08-22 VITALS — BP 115/67 | HR 88 | Temp 97.9°F | Resp 18 | Ht 60.0 in | Wt 241.0 lb

## 2015-08-22 DIAGNOSIS — D509 Iron deficiency anemia, unspecified: Secondary | ICD-10-CM | POA: Diagnosis not present

## 2015-08-22 DIAGNOSIS — Z8572 Personal history of non-Hodgkin lymphomas: Secondary | ICD-10-CM | POA: Diagnosis not present

## 2015-08-22 DIAGNOSIS — K909 Intestinal malabsorption, unspecified: Secondary | ICD-10-CM | POA: Diagnosis not present

## 2015-08-22 DIAGNOSIS — M069 Rheumatoid arthritis, unspecified: Secondary | ICD-10-CM | POA: Diagnosis not present

## 2015-08-22 LAB — COMPREHENSIVE METABOLIC PANEL
ALT: 16 U/L (ref 0–55)
AST: 18 U/L (ref 5–34)
Albumin: 3.7 g/dL (ref 3.5–5.0)
Alkaline Phosphatase: 68 U/L (ref 40–150)
Anion Gap: 9 mEq/L (ref 3–11)
BUN: 17.5 mg/dL (ref 7.0–26.0)
CO2: 26 mEq/L (ref 22–29)
Calcium: 9.8 mg/dL (ref 8.4–10.4)
Chloride: 108 mEq/L (ref 98–109)
Creatinine: 0.8 mg/dL (ref 0.6–1.1)
EGFR: 88 mL/min/{1.73_m2} — ABNORMAL LOW (ref >90)
Glucose: 92 mg/dl (ref 70–140)
Potassium: 3.9 mEq/L (ref 3.5–5.1)
Sodium: 143 mEq/L (ref 136–145)
Total Bilirubin: 0.33 mg/dL (ref 0.20–1.20)
Total Protein: 6.9 g/dL (ref 6.4–8.3)

## 2015-08-22 LAB — CBC WITH DIFFERENTIAL (CANCER CENTER ONLY)
BASO#: 0 10*3/uL (ref 0.0–0.2)
BASO%: 0.4 % (ref 0.0–2.0)
EOS%: 2.9 % (ref 0.0–7.0)
Eosinophils Absolute: 0.2 10*3/uL (ref 0.0–0.5)
HCT: 31.1 % — ABNORMAL LOW (ref 34.8–46.6)
HGB: 10.5 g/dL — ABNORMAL LOW (ref 11.6–15.9)
LYMPH#: 2.4 10*3/uL (ref 0.9–3.3)
LYMPH%: 46.4 % (ref 14.0–48.0)
MCH: 36.1 pg — ABNORMAL HIGH (ref 26.0–34.0)
MCHC: 33.8 g/dL (ref 32.0–36.0)
MCV: 107 fL — ABNORMAL HIGH (ref 81–101)
MONO#: 0.5 10*3/uL (ref 0.1–0.9)
MONO%: 9.6 % (ref 0.0–13.0)
NEUT#: 2.1 10*3/uL (ref 1.5–6.5)
NEUT%: 40.7 % (ref 39.6–80.0)
Platelets: 176 10*3/uL (ref 145–400)
RBC: 2.91 10*6/uL — ABNORMAL LOW (ref 3.70–5.32)
RDW: 14.4 % (ref 11.1–15.7)
WBC: 5.2 10*3/uL (ref 3.9–10.0)

## 2015-08-22 LAB — IRON AND TIBC
%SAT: 46 % (ref 21–57)
Iron: 109 ug/dL (ref 41–142)
TIBC: 236 ug/dL (ref 236–444)
UIBC: 128 ug/dL (ref 120–384)

## 2015-08-22 LAB — RETICULOCYTES: Reticulocyte Count: 2.2 % (ref 0.6–2.6)

## 2015-08-22 LAB — FERRITIN: Ferritin: 704 ng/ml — ABNORMAL HIGH (ref 9–269)

## 2015-08-22 NOTE — Progress Notes (Signed)
No treatment need today per Dr. Marin Olp orders.

## 2015-08-22 NOTE — Progress Notes (Signed)
Hematology and Oncology Follow Up Visit  Helen Hardy [id number] 1951/03/09 65 y.o. 08/22/2015   Principle Diagnosis:  1. 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 Rheumatoid arthritis 3. Anemia of chronic disease 4.  Iron deficiency anemia  Current Therapy:   IV Iron as indicated    Interim History:  Helen Hardy is here today for a follow-up.her rheumatoid arthritis since we are getting a little worse. She does see a new rheumatologist, Dr. Dossie Der. She adjusted her medications.  Helen Hardy is having some problems with her right knee. It sounds like she may need to have right knee surgery later on this year. She had left knee surgery last year and has done well with this.  When we saw her in December, her ferritin was 666 with an iron saturation 47%.  She's had no bleeding.  She has had no nausea or vomiting. Her appetite has been doing pretty well.  She has had no fever. There has been no cough or shortness of breath. She is had no mouth sores. He has been    Overall, her performance status is ECOG 2.   Medications:    Medication List       This list is accurate as of: 08/22/15  8:58 AM.  Always use your most recent med list.               azithromycin 250 MG tablet  Commonly known as:  ZITHROMAX  Take 1 tablet (250 mg total) by mouth daily. Take first 2 tablets together, then 1 every day until finished.     cyclobenzaprine 10 MG tablet  Commonly known as:  FLEXERIL  Take 0.5 tablets (5 mg total) by mouth 3 (three) times daily as needed for muscle spasms.     docusate sodium 100 MG capsule  Commonly known as:  COLACE  Take 1 capsule (100 mg total) by mouth 2 (two) times daily.     estradiol 1 MG tablet  Commonly known as:  ESTRACE  Take 1 mg by mouth daily.     ferrous sulfate 325 (65 FE) MG tablet  Take 1 tablet (325 mg total) by mouth 3 (three) times daily after meals.     folic acid 790 MCG tablet  Commonly known as:   FOLVITE  Take 800 mcg by mouth 2 (two) times daily.     HYDROcodone-acetaminophen 7.5-325 MG tablet  Commonly known as:  NORCO  Take 1-2 tablets by mouth every 4 (four) hours as needed for moderate pain.     leflunomide 10 MG tablet  Commonly known as:  ARAVA     multivitamin tablet  Take 1 tablet by mouth daily.     pantoprazole 40 MG tablet  Commonly known as:  PROTONIX  Take 40 mg by mouth 2 (two) times daily.     polyethylene glycol packet  Commonly known as:  MIRALAX / GLYCOLAX  Take 17 g by mouth 2 (two) times daily.     predniSONE 5 MG tablet  Commonly known as:  DELTASONE  Take 5 mg by mouth daily with breakfast.     triamcinolone cream 0.1 %  Commonly known as:  KENALOG  Apply 1 application topically daily as needed (break out on neck from heat).     valsartan-hydrochlorothiazide 160-25 MG tablet  Commonly known as:  DIOVAN-HCT  Take 1 tablet by mouth daily.     vitamin B-12 1000 MCG tablet  Commonly known as:  CYANOCOBALAMIN  Take 1,000  mcg by mouth daily.     Vitamin D 2000 units Caps  Take 2,000 Units by mouth 2 (two) times daily.        Allergies:  Allergies  Allergen Reactions  . Oxycodone Nausea And Vomiting    Past Medical History, Surgical history, Social history, and Family History were reviewed and updated.  Review of Systems: All other 10 point review of systems is negative.   Physical Exam:  height is 5' (1.524 m) and weight is 241 lb (109.317 kg). Her oral temperature is 97.9 F (36.6 C). Her blood pressure is 115/67 and her pulse is 88. Her respiration is 18.   Wt Readings from Last 3 Encounters:  08/22/15 241 lb (109.317 kg)  06/21/15 234 lb (106.142 kg)  04/08/15 240 lb (108.863 kg)    Obese African-American female. Head and neck exam shows no ocular or oral lesions. She has no palpable cervical or supraclavicular lymph nodes. Lungs are clear. Cardiac exam regular rate and rhythm with no murmurs, rubs or bruits. Abdomen is soft.  She has good bowel sounds. There is no fluid wave. There is no palpable liver or spleen tip. Back exam shows no tenderness over the spine, ribs or hips. Extremities shows no clubbing, cyanosis or edema. She is joint changes consistent with her rheumatoid arthritis. Skin exam shows no rashes, ecchymoses or petechia. Neurological exam shows no focal deficits.   Lab Results  Component Value Date   WBC 5.2 08/22/2015   HGB 10.5* 08/22/2015   HCT 31.1* 08/22/2015   MCV 107* 08/22/2015   PLT 176 08/22/2015   Lab Results  Component Value Date   FERRITIN 666* 06/21/2015   IRON 107 06/21/2015   TIBC 230* 06/21/2015   UIBC 123 06/21/2015   IRONPCTSAT 47 06/21/2015   Lab Results  Component Value Date   RETICCTPCT 1.4 06/21/2015   RBC 2.91* 08/22/2015   RETICCTABS 41.0 06/21/2015   No results found for: KPAFRELGTCHN, LAMBDASER, KAPLAMBRATIO No results found for: IGGSERUM, IGA, IGMSERUM No results found for: Odetta Pink, SPEI   Chemistry      Component Value Date/Time   NA 140 06/21/2015 0800   NA 140 04/26/2015 1212   NA 141 04/10/2015 0600   K 3.6 06/21/2015 0800   K 4.1 04/26/2015 1212   K 4.5 04/10/2015 0600   CL 103 06/21/2015 0800   CL 111 04/10/2015 0600   CO2 25 06/21/2015 0800   CO2 23 04/26/2015 1212   CO2 27 04/10/2015 0600   BUN 22 06/21/2015 0800   BUN 21.9 04/26/2015 1212   BUN 17 04/10/2015 0600   CREATININE 0.7 06/21/2015 0800   CREATININE 0.9 04/26/2015 1212   CREATININE 0.57 04/10/2015 0600      Component Value Date/Time   CALCIUM 9.6 06/21/2015 0800   CALCIUM 10.0 04/26/2015 1212   CALCIUM 8.2* 04/10/2015 0600   ALKPHOS 54 06/21/2015 0800   ALKPHOS 76 04/26/2015 1212   ALKPHOS 56 11/16/2014 1033   AST 20 06/21/2015 0800   AST 16 04/26/2015 1212   AST 17 11/16/2014 1033   ALT 15 06/21/2015 0800   ALT 10 04/26/2015 1212   ALT 13 11/16/2014 1033   BILITOT 0.60 06/21/2015 0800   BILITOT <0.30  04/26/2015 1212   BILITOT 0.4 11/16/2014 1033     Impression and Plan: [name] is 65 yo African [place] female with past history of low-grade non-Hodgkin's lymphoma. She completed treatment over 13 years ago  and so far there has been no evidence of recurrence.    From my point of view, she needs to have right knee surgery, I do not see a problem with this.   I would be spry's from studies are low. Her hemoglobin is holding pretty steady.  She does have a moderately low erythropoietin level. I don't think we have to give her any Epogen however.  I would like to see her back in another 3-4 months.   Volanda Napoleon, MD 2/16/20178:58 AM

## 2015-08-23 DIAGNOSIS — M0579 Rheumatoid arthritis with rheumatoid factor of multiple sites without organ or systems involvement: Secondary | ICD-10-CM | POA: Diagnosis not present

## 2015-09-18 DIAGNOSIS — Z471 Aftercare following joint replacement surgery: Secondary | ICD-10-CM | POA: Diagnosis not present

## 2015-09-18 DIAGNOSIS — M25551 Pain in right hip: Secondary | ICD-10-CM | POA: Diagnosis not present

## 2015-09-18 DIAGNOSIS — Z96652 Presence of left artificial knee joint: Secondary | ICD-10-CM | POA: Diagnosis not present

## 2015-09-23 DIAGNOSIS — M0579 Rheumatoid arthritis with rheumatoid factor of multiple sites without organ or systems involvement: Secondary | ICD-10-CM | POA: Diagnosis not present

## 2015-09-23 DIAGNOSIS — M0589 Other rheumatoid arthritis with rheumatoid factor of multiple sites: Secondary | ICD-10-CM | POA: Diagnosis not present

## 2015-09-24 DIAGNOSIS — R432 Parageusia: Secondary | ICD-10-CM | POA: Diagnosis not present

## 2015-09-24 DIAGNOSIS — K219 Gastro-esophageal reflux disease without esophagitis: Secondary | ICD-10-CM | POA: Diagnosis not present

## 2015-09-24 DIAGNOSIS — I10 Essential (primary) hypertension: Secondary | ICD-10-CM | POA: Diagnosis not present

## 2015-09-25 ENCOUNTER — Other Ambulatory Visit: Payer: Self-pay | Admitting: Internal Medicine

## 2015-09-25 DIAGNOSIS — K219 Gastro-esophageal reflux disease without esophagitis: Secondary | ICD-10-CM

## 2015-09-27 ENCOUNTER — Ambulatory Visit
Admission: RE | Admit: 2015-09-27 | Discharge: 2015-09-27 | Disposition: A | Discharge status: Home / Self Care | Payer: Medicare Other | Source: Ambulatory Visit | Attending: Internal Medicine | Admitting: Internal Medicine

## 2015-09-27 DIAGNOSIS — K219 Gastro-esophageal reflux disease without esophagitis: Secondary | ICD-10-CM

## 2015-09-28 ENCOUNTER — Emergency Department (HOSPITAL_COMMUNITY)
Admission: EM | Admit: 2015-09-28 | Discharge: 2015-09-28 | Disposition: A | Discharge status: Home / Self Care | Payer: Medicare Other | Attending: Emergency Medicine | Admitting: Emergency Medicine

## 2015-09-28 ENCOUNTER — Emergency Department (HOSPITAL_COMMUNITY): Payer: Medicare Other

## 2015-09-28 ENCOUNTER — Encounter (HOSPITAL_COMMUNITY): Payer: Self-pay

## 2015-09-28 DIAGNOSIS — R05 Cough: Secondary | ICD-10-CM | POA: Diagnosis not present

## 2015-09-28 DIAGNOSIS — Z7952 Long term (current) use of systemic steroids: Secondary | ICD-10-CM | POA: Insufficient documentation

## 2015-09-28 DIAGNOSIS — Z79899 Other long term (current) drug therapy: Secondary | ICD-10-CM | POA: Insufficient documentation

## 2015-09-28 DIAGNOSIS — Z8701 Personal history of pneumonia (recurrent): Secondary | ICD-10-CM | POA: Insufficient documentation

## 2015-09-28 DIAGNOSIS — D649 Anemia, unspecified: Secondary | ICD-10-CM | POA: Insufficient documentation

## 2015-09-28 DIAGNOSIS — R509 Fever, unspecified: Secondary | ICD-10-CM | POA: Diagnosis not present

## 2015-09-28 DIAGNOSIS — Z8719 Personal history of other diseases of the digestive system: Secondary | ICD-10-CM | POA: Insufficient documentation

## 2015-09-28 DIAGNOSIS — Z8571 Personal history of Hodgkin lymphoma: Secondary | ICD-10-CM | POA: Diagnosis not present

## 2015-09-28 DIAGNOSIS — R059 Cough, unspecified: Secondary | ICD-10-CM

## 2015-09-28 DIAGNOSIS — I10 Essential (primary) hypertension: Secondary | ICD-10-CM | POA: Insufficient documentation

## 2015-09-28 DIAGNOSIS — M069 Rheumatoid arthritis, unspecified: Secondary | ICD-10-CM | POA: Diagnosis not present

## 2015-09-28 DIAGNOSIS — K219 Gastro-esophageal reflux disease without esophagitis: Secondary | ICD-10-CM | POA: Diagnosis not present

## 2015-09-28 MED ORDER — HYDROCOD POLST-CPM POLST ER 10-8 MG/5ML PO SUER: 5.0000 mL | Freq: Every evening | ORAL | Status: DC | PRN

## 2015-09-28 NOTE — ED Provider Notes (Signed)
CSN: [id number]     Arrival date & time 09/28/15  1837 History  By signing my name below, I, [facility], attest that this documentation has been prepared under the direction and in the presence of Lillien Petronio A Early Ord, PA-C. Electronically Signed: Virgel Bouquet, ED Scribe. 09/28/2015. 9:39 PM.   Chief Complaint  Patient presents with  . Cough   The history is provided by the patient. No language interpreter was used.   HPI Comments: Helen Hardy is a 65 y.o. female with an hx of Hodgkin's Lymphoma, GERD, and HTN who presents to the Emergency Department complaining of intermittent, mild cough ongoing for the past 3 weeks. She has been seen by her PCP Dr. [name] for these symptoms this past week and by Dr. Zigmund Daniel where she had an upper GI scope which had normal results. She notes that the cough intermittently produces a sour taste in her mouth. She endorses associated sleep disturbance secondary to cough. Cough is worse when lying flat. She has been eating soft foods, drinking water, and sleeping in an elevated position without relief. She takes Protonix and hydrocodone and other medications. Denies fever and congestion.  Past Medical History  Diagnosis Date  . Arthritis     Rheumatoid  . Cancer ([place])     Hodkins Lymphoma  . Hypertension   . Pneumonia   . GERD (gastroesophageal reflux disease)   . Anemia   . Malabsorption of iron 06/21/2015   Past Surgical History  Procedure Laterality Date  . Rotator cuff repair    . Joint replacement      both knee replacement  . Bladder tack - 2005    . Abdominal hysterectomy    . 2012 right big toe and toe beside - had pins in toes    . Eye surgery      cataract  . Total knee revision Left 04/08/2015    Procedure: REVISION LEFT TOTAL KNEE ;  Surgeon: Paralee Cancel, MD;  Location: [facility];  Service: Orthopedics;  Laterality: Left;   History reviewed. No pertinent family history. Social History  Substance Use Topics  . Smoking  status: Never Smoker   . Smokeless tobacco: Never Used  . Alcohol Use: No   OB History    No data available     Review of Systems  Constitutional: Negative for fever.  HENT: Negative for congestion.   Respiratory: Positive for cough.       Allergies  Oxycodone  Home Medications   Prior to Admission medications   Medication Sig Start Date End Date Taking? Authorizing Provider  azithromycin (ZITHROMAX) 250 MG tablet Take 1 tablet (250 mg total) by mouth daily. Take first 2 tablets together, then 1 every day until finished. 06/16/15   Delos Haring, PA-C  Cholecalciferol (VITAMIN D) 2000 UNITS CAPS Take 2,000 Units by mouth 2 (two) times daily.    Historical Provider, MD  cyclobenzaprine (FLEXERIL) 10 MG tablet Take 0.5 tablets (5 mg total) by mouth 3 (three) times daily as needed for muscle spasms. 04/10/15   Danae Orleans, PA-C  docusate sodium (COLACE) 100 MG capsule Take 1 capsule (100 mg total) by mouth 2 (two) times daily. 04/10/15   Danae Orleans, PA-C  estradiol (ESTRACE) 1 MG tablet Take 1 mg by mouth daily.  12/25/12   Historical Provider, MD  ferrous sulfate 325 (65 FE) MG tablet Take 1 tablet (325 mg total) by mouth 3 (three) times daily after meals. 04/10/15   Danae Orleans, PA-C  folic  acid (FOLVITE) 800 MCG tablet Take 800 mcg by mouth 2 (two) times daily.    Historical Provider, MD  HYDROcodone-acetaminophen (NORCO) 7.5-325 MG tablet Take 1-2 tablets by mouth every 4 (four) hours as needed for moderate pain. 04/10/15   Danae Orleans, PA-C  leflunomide (ARAVA) 10 MG tablet  06/18/15   Historical Provider, MD  Multiple Vitamin (MULTIVITAMIN) tablet Take 1 tablet by mouth daily.    Historical Provider, MD  pantoprazole (PROTONIX) 40 MG tablet Take 40 mg by mouth 2 (two) times daily.     Historical Provider, MD  polyethylene glycol (MIRALAX / GLYCOLAX) packet Take 17 g by mouth 2 (two) times daily. 04/10/15   Danae Orleans, PA-C  predniSONE (DELTASONE) 5 MG tablet Take 5  mg by mouth daily with breakfast.  10/29/14   Historical Provider, MD  triamcinolone cream (KENALOG) 0.1 % Apply 1 application topically daily as needed (break out on neck from heat).  10/29/14   Historical Provider, MD  valsartan-hydrochlorothiazide (DIOVAN-HCT) 160-25 MG per tablet Take 1 tablet by mouth daily.     Historical Provider, MD  vitamin B-12 (CYANOCOBALAMIN) 1000 MCG tablet Take 1,000 mcg by mouth daily.    Historical Provider, MD   BP 138/92 mmHg  Pulse 96  Temp(Src) 98.2 F (36.8 C) (Oral)  Resp 20  SpO2 100% Physical Exam  Constitutional: She is oriented to person, place, and time. She appears well-developed and well-nourished. No distress.  HENT:  Head: Normocephalic and atraumatic.  Eyes: Conjunctivae and EOM are normal.  Neck: Neck supple. No tracheal deviation present.  Cardiovascular: Normal rate.   Pulmonary/Chest: Effort normal. No respiratory distress.  Musculoskeletal: Normal range of motion.  Neurological: She is alert and oriented to person, place, and time.  Skin: Skin is warm and dry.  Psychiatric: She has a normal mood and affect. Her behavior is normal.  Nursing note and vitals reviewed.   ED Course  Procedures   DIAGNOSTIC STUDIES: Oxygen Saturation is 100% on RA, normal by my interpretation.    COORDINATION OF CARE: 9:21 PM Discussed results of chest x-ray. Will prescribe cough medication. Advised pt to follow-up with PCP Dr. [name]. Discussed treatment plan with pt at bedside and pt agreed to plan.  Imaging Review Dg Chest 2 View  09/28/2015  CLINICAL DATA:  Cough x 2 weeks. Pain during cough. Using meds with no relief. No fever. Nausea. Pt had UGI yesterday, hx hodgkin's lymphoma, non-smoker. EXAM: CHEST  2 VIEW COMPARISON:  06/16/2015 FINDINGS: Cardiac silhouette normal in size and configuration. Normal mediastinal hilar contours. Lungs are clear.  No pleural effusion or pneumothorax. Bony thorax is intact. IMPRESSION: No active cardiopulmonary  disease. Electronically Signed   By: Lajean Manes M.D.   On: 09/28/2015 19:30   Dg Esophagus  09/27/2015  CLINICAL DATA:  Gastroesophageal reflux disease. EXAM: ESOPHOGRAM / BARIUM SWALLOW / BARIUM TABLET STUDY TECHNIQUE: Combined double contrast and single contrast examination performed using effervescent crystals, thick barium liquid, and thin barium liquid. The patient was observed with fluoroscopy swallowing a 13 mm barium sulphate tablet. FLUOROSCOPY TIME:  Radiation Exposure Index (as provided by the fluoroscopic device): 36 dGycm2 COMPARISON:  None. FINDINGS: The oropharyngeal swallowing mechanisms are normal. The mucosa of the esophagus is normal. The patient has a poor secondary stripping wave with stasis of contrast in the esophagus when in the prone and supine positions. The gastroesophageal junction is patulous. No hiatal hernia. No strictures. 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 However, there is no  stricture or mass or hiatal hernia. Patulous gastroesophageal junction could predispose to reflux. Electronically Signed   By: Lorriane Shire M.D.   On: 09/27/2015 11:46   I have personally reviewed and evaluated these images as part of my medical decision-making.    MDM   Final diagnoses:  None    1. Cough  Patient presents with cough for the past 3 weeks. No fever. No SOB, cough worse when lying flat. She has been seen by her PCP. She presents because the cough is keeping her awake. Will provide cough relief and encourage PCP follow up. Patient and family are comfortable with discharge plan.   I personally performed the services described in this documentation, which was scribed in my presence. The recorded information has been reviewed and is accurate.     Charlann Lange, PA-C 10/02/15 0010  Lacretia Leigh, MD 10/03/15 [phone]

## 2015-09-28 NOTE — Discharge Instructions (Signed)
Cough, Adult A cough helps to clear your throat and lungs. A cough may last only 2-3 weeks (acute), or it may last longer than 8 weeks (chronic). Many different things can cause a cough. A cough may be a sign of an illness or another medical condition. HOME CARE  Pay attention to any changes in your cough.  Take medicines only as told by your doctor.  If you were prescribed an antibiotic medicine, take it as told by your doctor. Do not stop taking it even if you start to feel better.  Talk with your doctor before you try using a cough medicine.  Drink enough fluid to keep your pee (urine) clear or pale yellow.  If the air is dry, use a cold steam vaporizer or humidifier in your home.  Stay away from things that make you cough at work or at home.  If your cough is worse at night, try using extra pillows to raise your head up higher while you sleep.  Do not smoke, and try not to be around smoke. If you need help quitting, ask your doctor.  Do not have caffeine.  Do not drink alcohol.  Rest as needed. GET HELP IF:  You have new problems (symptoms).  You cough up yellow fluid (pus).  Your cough does not get better after 2-3 weeks, or your cough gets worse.  Medicine does not help your cough and you are not sleeping well.  You have pain that gets worse or pain that is not helped with medicine.  You have a fever.  You are losing weight and you do not know why.  You have night sweats. GET HELP RIGHT AWAY IF:  You cough up blood.  You have trouble breathing.  Your heartbeat is very fast.   This information is not intended to replace advice given to you by your health care provider. Make sure you discuss any questions you have with your health care provider.   Document Released: 03/05/2011 Document Revised: 03/13/2015 Document Reviewed: 08/29/2014 Elsevier Interactive Patient Education [employer].

## 2015-09-28 NOTE — ED Notes (Signed)
Cough x 2 weeks.  Pain during cough.  Using meds with no relief.  No fever.  Nausea

## 2015-10-02 DIAGNOSIS — R1310 Dysphagia, unspecified: Secondary | ICD-10-CM | POA: Diagnosis not present

## 2015-10-02 DIAGNOSIS — J069 Acute upper respiratory infection, unspecified: Secondary | ICD-10-CM | POA: Diagnosis not present

## 2015-10-15 DIAGNOSIS — R05 Cough: Secondary | ICD-10-CM | POA: Diagnosis not present

## 2015-10-16 ENCOUNTER — Other Ambulatory Visit (HOSPITAL_COMMUNITY): Payer: Self-pay | Admitting: Respiratory Therapy

## 2015-10-16 DIAGNOSIS — R05 Cough: Secondary | ICD-10-CM

## 2015-10-16 DIAGNOSIS — R059 Cough, unspecified: Secondary | ICD-10-CM

## 2015-10-21 DIAGNOSIS — Z79899 Other long term (current) drug therapy: Secondary | ICD-10-CM | POA: Diagnosis not present

## 2015-10-21 DIAGNOSIS — M0579 Rheumatoid arthritis with rheumatoid factor of multiple sites without organ or systems involvement: Secondary | ICD-10-CM | POA: Diagnosis not present

## 2015-10-21 DIAGNOSIS — G629 Polyneuropathy, unspecified: Secondary | ICD-10-CM | POA: Diagnosis not present

## 2015-10-21 DIAGNOSIS — M0589 Other rheumatoid arthritis with rheumatoid factor of multiple sites: Secondary | ICD-10-CM | POA: Diagnosis not present

## 2015-10-22 ENCOUNTER — Ambulatory Visit (HOSPITAL_COMMUNITY)
Admission: RE | Admit: 2015-10-22 | Discharge: 2015-10-22 | Disposition: A | Discharge status: Home / Self Care | Payer: Medicare Other | Source: Ambulatory Visit | Attending: Internal Medicine | Admitting: Internal Medicine

## 2015-10-22 DIAGNOSIS — R05 Cough: Secondary | ICD-10-CM | POA: Insufficient documentation

## 2015-10-22 MED ORDER — ALBUTEROL SULFATE (2.5 MG/3ML) 0.083% IN NEBU
2.5000 mg | INHALATION_SOLUTION | Freq: Once | RESPIRATORY_TRACT | Status: AC
  Administered 2015-10-22: 2.5 mg via RESPIRATORY_TRACT

## 2015-10-24 LAB — PULMONARY FUNCTION TEST
DL/VA % pred: 93 %
DL/VA: 3.95 ml/min/mmHg/L
DLCO unc % pred: 73 %
DLCO unc: 13.88 ml/min/mmHg
FEF 25-75 Post: 3.55 L/sec
FEF 25-75 Pre: 3.27 L/sec
FEF2575-%Change-Post: 8 %
FEF2575-%Pred-Post: 228 %
FEF2575-%Pred-Pre: 211 %
FEV1-%Change-Post: 3 %
FEV1-%Pred-Post: 131 %
FEV1-%Pred-Pre: 126 %
FEV1-Post: 2.07 L
FEV1-Pre: 2 L
FEV1FVC-%Change-Post: 0 %
FEV1FVC-%Pred-Pre: 117 %
FEV6-%Change-Post: 3 %
FEV6-%Pred-Post: 116 %
FEV6-%Pred-Pre: 112 %
FEV6-Post: 2.27 L
FEV6-Pre: 2.19 L
FEV6FVC-%Pred-Post: 104 %
FEV6FVC-%Pred-Pre: 104 %
FVC-%Change-Post: 3 %
FVC-%Pred-Post: 111 %
FVC-%Pred-Pre: 107 %
FVC-Post: 2.27 L
FVC-Pre: 2.19 L
Post FEV1/FVC ratio: 91 %
Post FEV6/FVC ratio: 100 %
Pre FEV1/FVC ratio: 91 %
Pre FEV6/FVC Ratio: 100 %
RV % pred: 95 %
RV: 1.84 L
TLC % pred: 94 %
TLC: 4.19 L

## 2015-10-30 DIAGNOSIS — K219 Gastro-esophageal reflux disease without esophagitis: Secondary | ICD-10-CM | POA: Diagnosis not present

## 2015-10-30 DIAGNOSIS — R1310 Dysphagia, unspecified: Secondary | ICD-10-CM | POA: Diagnosis not present

## 2015-10-30 DIAGNOSIS — R05 Cough: Secondary | ICD-10-CM | POA: Diagnosis not present

## 2015-11-07 DIAGNOSIS — K222 Esophageal obstruction: Secondary | ICD-10-CM | POA: Diagnosis not present

## 2015-11-07 DIAGNOSIS — R1310 Dysphagia, unspecified: Secondary | ICD-10-CM | POA: Diagnosis not present

## 2015-11-11 DIAGNOSIS — Q394 Esophageal web: Secondary | ICD-10-CM | POA: Diagnosis not present

## 2015-11-11 DIAGNOSIS — I10 Essential (primary) hypertension: Secondary | ICD-10-CM | POA: Diagnosis not present

## 2015-11-11 DIAGNOSIS — K219 Gastro-esophageal reflux disease without esophagitis: Secondary | ICD-10-CM | POA: Diagnosis not present

## 2015-11-11 DIAGNOSIS — R05 Cough: Secondary | ICD-10-CM | POA: Diagnosis not present

## 2015-11-15 DIAGNOSIS — I10 Essential (primary) hypertension: Secondary | ICD-10-CM | POA: Diagnosis not present

## 2015-11-15 DIAGNOSIS — R05 Cough: Secondary | ICD-10-CM | POA: Diagnosis not present

## 2015-11-18 DIAGNOSIS — M0589 Other rheumatoid arthritis with rheumatoid factor of multiple sites: Secondary | ICD-10-CM | POA: Diagnosis not present

## 2015-11-19 DIAGNOSIS — R490 Dysphonia: Secondary | ICD-10-CM | POA: Diagnosis not present

## 2015-11-19 DIAGNOSIS — H6123 Impacted cerumen, bilateral: Secondary | ICD-10-CM | POA: Diagnosis not present

## 2015-11-19 DIAGNOSIS — R05 Cough: Secondary | ICD-10-CM | POA: Diagnosis not present

## 2015-11-20 ENCOUNTER — Other Ambulatory Visit (HOSPITAL_BASED_OUTPATIENT_CLINIC_OR_DEPARTMENT_OTHER): Payer: Medicare Other

## 2015-11-20 ENCOUNTER — Encounter: Payer: Self-pay | Admitting: Hematology & Oncology

## 2015-11-20 ENCOUNTER — Telehealth: Payer: Self-pay | Admitting: Nurse Practitioner

## 2015-11-20 ENCOUNTER — Ambulatory Visit: Payer: Medicare Other

## 2015-11-20 ENCOUNTER — Ambulatory Visit (HOSPITAL_BASED_OUTPATIENT_CLINIC_OR_DEPARTMENT_OTHER): Payer: Medicare Other | Admitting: Hematology & Oncology

## 2015-11-20 VITALS — BP 148/88 | HR 101 | Temp 97.6°F | Resp 18 | Ht 60.0 in | Wt 232.0 lb

## 2015-11-20 DIAGNOSIS — M069 Rheumatoid arthritis, unspecified: Secondary | ICD-10-CM | POA: Diagnosis not present

## 2015-11-20 DIAGNOSIS — K909 Intestinal malabsorption, unspecified: Secondary | ICD-10-CM

## 2015-11-20 DIAGNOSIS — Z8572 Personal history of non-Hodgkin lymphomas: Secondary | ICD-10-CM | POA: Diagnosis not present

## 2015-11-20 DIAGNOSIS — D509 Iron deficiency anemia, unspecified: Secondary | ICD-10-CM | POA: Diagnosis not present

## 2015-11-20 DIAGNOSIS — D649 Anemia, unspecified: Secondary | ICD-10-CM

## 2015-11-20 LAB — FERRITIN: Ferritin: 687 ng/ml — ABNORMAL HIGH (ref 9–269)

## 2015-11-20 LAB — COMPREHENSIVE METABOLIC PANEL
ALT: 16 U/L (ref 0–55)
AST: 16 U/L (ref 5–34)
Albumin: 3.7 g/dL (ref 3.5–5.0)
Alkaline Phosphatase: 55 U/L (ref 40–150)
Anion Gap: 9 mEq/L (ref 3–11)
BUN: 16.4 mg/dL (ref 7.0–26.0)
CO2: 22 mEq/L (ref 22–29)
Calcium: 9.6 mg/dL (ref 8.4–10.4)
Chloride: 111 mEq/L — ABNORMAL HIGH (ref 98–109)
Creatinine: 0.8 mg/dL (ref 0.6–1.1)
EGFR: 84 mL/min/{1.73_m2} — ABNORMAL LOW (ref >90)
Glucose: 122 mg/dl (ref 70–140)
Potassium: 3.7 mEq/L (ref 3.5–5.1)
Sodium: 142 mEq/L (ref 136–145)
Total Bilirubin: 0.42 mg/dL (ref 0.20–1.20)
Total Protein: 6.6 g/dL (ref 6.4–8.3)

## 2015-11-20 LAB — CBC WITH DIFFERENTIAL (CANCER CENTER ONLY)
BASO#: 0 10*3/uL (ref 0.0–0.2)
BASO%: 0.2 % (ref 0.0–2.0)
EOS%: 1.9 % (ref 0.0–7.0)
Eosinophils Absolute: 0.1 10*3/uL (ref 0.0–0.5)
HCT: 31.9 % — ABNORMAL LOW (ref 34.8–46.6)
HGB: 10.8 g/dL — ABNORMAL LOW (ref 11.6–15.9)
LYMPH#: 2.1 10*3/uL (ref 0.9–3.3)
LYMPH%: 40.5 % (ref 14.0–48.0)
MCH: 35.9 pg — ABNORMAL HIGH (ref 26.0–34.0)
MCHC: 33.9 g/dL (ref 32.0–36.0)
MCV: 106 fL — ABNORMAL HIGH (ref 81–101)
MONO#: 0.5 10*3/uL (ref 0.1–0.9)
MONO%: 9.5 % (ref 0.0–13.0)
NEUT#: 2.5 10*3/uL (ref 1.5–6.5)
NEUT%: 47.9 % (ref 39.6–80.0)
Platelets: 164 10*3/uL (ref 145–400)
RBC: 3.01 10*6/uL — ABNORMAL LOW (ref 3.70–5.32)
RDW: 13.9 % (ref 11.1–15.7)
WBC: 5.2 10*3/uL (ref 3.9–10.0)

## 2015-11-20 LAB — IRON AND TIBC
%SAT: 43 % (ref 21–57)
Iron: 97 ug/dL (ref 41–142)
TIBC: 225 ug/dL — ABNORMAL LOW (ref 236–444)
UIBC: 128 ug/dL (ref 120–384)

## 2015-11-20 NOTE — Telephone Encounter (Addendum)
Pt verbalized understanding and appreciation. ----- Message from Volanda Napoleon, MD sent at 11/20/2015 12:38 PM EDT ----- Call - iron level is ok!!  pete

## 2015-11-20 NOTE — Progress Notes (Signed)
No treatment today per dr. Marin Olp

## 2015-11-20 NOTE — Progress Notes (Signed)
Hematology and Oncology Follow Up Visit  Helen Hardy [id number] Dec 09, 1950 65 y.o. 11/20/2015   Principle Diagnosis:  1. 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 Rheumatoid arthritis 3. Anemia of chronic disease 4.  Iron deficiency anemia  Current Therapy:   IV Iron as indicated    Interim History:  Helen Hardy is here today for a follow-up. Her problem now is swallowing. She has reflux. She was seen by gastroenterology. She's been seen by ENT. She's had laryngoscopy. She's had upper endoscopy. She is on medication now for the reflux.  Her anemia is improving. Her hemoglobin is slowly coming up. Her iron studies have looked good.  Her last rhythm free level XI due to her late last year was 116.  She's had no fever. She's had no cough. She's had no rashes.  The rheumatoid arthritis does not seem to be as bad right now.    Overall, her performance status is ECOG 2.   Medications:    Medication List       This list is accurate as of: 11/20/15  8:45 AM.  Always use your most recent med list.               amLODipine 10 MG tablet  Commonly known as:  NORVASC  Take 10 mg by mouth daily.     cyclobenzaprine 10 MG tablet  Commonly known as:  FLEXERIL  Take 0.5 tablets (5 mg total) by mouth 3 (three) times daily as needed for muscle spasms.     docusate sodium 100 MG capsule  Commonly known as:  COLACE  Take 1 capsule (100 mg total) by mouth 2 (two) times daily.     estradiol 1 MG tablet  Commonly known as:  ESTRACE  Take 1 mg by mouth daily.     ferrous sulfate 325 (65 FE) MG tablet  Take 1 tablet (325 mg total) by mouth 3 (three) times daily after meals.     folic acid 427 MCG tablet  Commonly known as:  FOLVITE  Take 800 mcg by mouth 2 (two) times daily.     gabapentin 300 MG capsule  Commonly known as:  NEURONTIN  Take 300 mg by mouth 2 (two) times daily.     HYDROcodone-acetaminophen 7.5-325 MG tablet  Commonly known  as:  NORCO  Take 1-2 tablets by mouth every 4 (four) hours as needed for moderate pain.     leflunomide 10 MG tablet  Commonly known as:  ARAVA     multivitamin tablet  Take 1 tablet by mouth daily.     pantoprazole 40 MG tablet  Commonly known as:  PROTONIX  Take 40 mg by mouth 2 (two) times daily.     predniSONE 5 MG tablet  Commonly known as:  DELTASONE  Take 5 mg by mouth daily with breakfast.     ranitidine 150 MG capsule  Commonly known as:  ZANTAC  Take 150 mg by mouth every evening.     vitamin B-12 1000 MCG tablet  Commonly known as:  CYANOCOBALAMIN  Take 1,000 mcg by mouth daily.     Vitamin D 2000 units Caps  Take 2,000 Units by mouth 2 (two) times daily.        Allergies:  Allergies  Allergen Reactions  . Oxycodone Nausea And Vomiting    Past Medical History, Surgical history, Social history, and Family History were reviewed and updated.  Review of Systems: All other 10 point review of systems is  negative.   Physical Exam:  height is 5' (1.524 m) and weight is 232 lb (105.235 kg). Her oral temperature is 97.6 F (36.4 C). Her blood pressure is 148/88 and her pulse is 101. Her respiration is 18.   Wt Readings from Last 3 Encounters:  11/20/15 232 lb (105.235 kg)  08/22/15 241 lb (109.317 kg)  06/21/15 234 lb (106.142 kg)    Obese African-American female. Head and neck exam shows no ocular or oral lesions. She has no palpable cervical or supraclavicular lymph nodes. Lungs are clear. Cardiac exam regular rate and rhythm with no murmurs, rubs or bruits. Abdomen is soft. She has good bowel sounds. There is no fluid wave. There is no palpable liver or spleen tip. Back exam shows no tenderness over the spine, ribs or hips. Extremities shows no clubbing, cyanosis or edema. She is joint changes consistent with her rheumatoid arthritis. Skin exam shows no rashes, ecchymoses or petechia. Neurological exam shows no focal deficits.   Lab Results  Component Value  Date   WBC 5.2 11/20/2015   HGB 10.8* 11/20/2015   HCT 31.9* 11/20/2015   MCV 106* 11/20/2015   PLT 164 11/20/2015   Lab Results  Component Value Date   FERRITIN 704* 08/22/2015   IRON 109 08/22/2015   TIBC 236 08/22/2015   UIBC 128 08/22/2015   IRONPCTSAT 46 08/22/2015   Lab Results  Component Value Date   RETICCTPCT 1.4 06/21/2015   RBC 3.01* 11/20/2015   RETICCTABS 41.0 06/21/2015   No results found for: KPAFRELGTCHN, LAMBDASER, KAPLAMBRATIO No results found for: IGGSERUM, IGA, IGMSERUM No results found for: Odetta Pink, SPEI   Chemistry      Component Value Date/Time   NA 143 08/22/2015 0802   NA 140 06/21/2015 0800   NA 141 04/10/2015 0600   K 3.9 08/22/2015 0802   K 3.6 06/21/2015 0800   K 4.5 04/10/2015 0600   CL 103 06/21/2015 0800   CL 111 04/10/2015 0600   CO2 26 08/22/2015 0802   CO2 25 06/21/2015 0800   CO2 27 04/10/2015 0600   BUN 17.5 08/22/2015 0802   BUN 22 06/21/2015 0800   BUN 17 04/10/2015 0600   CREATININE 0.8 08/22/2015 0802   CREATININE 0.7 06/21/2015 0800   CREATININE 0.57 04/10/2015 0600      Component Value Date/Time   CALCIUM 9.8 08/22/2015 0802   CALCIUM 9.6 06/21/2015 0800   CALCIUM 8.2* 04/10/2015 0600   ALKPHOS 68 08/22/2015 0802   ALKPHOS 54 06/21/2015 0800   ALKPHOS 56 11/16/2014 1033   AST 18 08/22/2015 0802   AST 20 06/21/2015 0800   AST 17 11/16/2014 1033   ALT 16 08/22/2015 0802   ALT 15 06/21/2015 0800   ALT 13 11/16/2014 1033   BILITOT 0.33 08/22/2015 0802   BILITOT 0.60 06/21/2015 0800   BILITOT 0.4 11/16/2014 1033     Impression and Plan: Helen Hardy is 65 yo African [place] female with past history of low-grade non-Hodgkin's lymphoma. She completed treatment over 13 years ago and so far there has been no evidence of recurrence.   As her hemoglobin is slowly improving, we can hold off on any Aranesp.  I'll measure when her right knee will be operated  on. His soundly with everything else going on, that she will not have this done.  We will continue to monitor her anemia. I think with her rheumatism, this positive most likely reason for the anemia.  I would like to see her back in another 3-4 months.   Volanda Napoleon, MD 5/17/20178:45 AM

## 2015-11-21 LAB — RETICULOCYTES: Reticulocyte Count: 2.2 % (ref 0.6–2.6)

## 2015-12-04 DIAGNOSIS — R1310 Dysphagia, unspecified: Secondary | ICD-10-CM | POA: Diagnosis not present

## 2015-12-04 DIAGNOSIS — K219 Gastro-esophageal reflux disease without esophagitis: Secondary | ICD-10-CM | POA: Diagnosis not present

## 2015-12-04 DIAGNOSIS — R05 Cough: Secondary | ICD-10-CM | POA: Diagnosis not present

## 2015-12-10 DIAGNOSIS — R0683 Snoring: Secondary | ICD-10-CM | POA: Diagnosis not present

## 2015-12-10 DIAGNOSIS — K219 Gastro-esophageal reflux disease without esophagitis: Secondary | ICD-10-CM | POA: Diagnosis not present

## 2015-12-10 DIAGNOSIS — D649 Anemia, unspecified: Secondary | ICD-10-CM | POA: Diagnosis not present

## 2015-12-10 DIAGNOSIS — I10 Essential (primary) hypertension: Secondary | ICD-10-CM | POA: Diagnosis not present

## 2015-12-16 DIAGNOSIS — M0579 Rheumatoid arthritis with rheumatoid factor of multiple sites without organ or systems involvement: Secondary | ICD-10-CM | POA: Diagnosis not present

## 2015-12-16 DIAGNOSIS — M0589 Other rheumatoid arthritis with rheumatoid factor of multiple sites: Secondary | ICD-10-CM | POA: Diagnosis not present

## 2015-12-17 DIAGNOSIS — R05 Cough: Secondary | ICD-10-CM | POA: Diagnosis not present

## 2015-12-17 DIAGNOSIS — R490 Dysphonia: Secondary | ICD-10-CM | POA: Diagnosis not present

## 2015-12-23 DIAGNOSIS — M25561 Pain in right knee: Secondary | ICD-10-CM | POA: Diagnosis not present

## 2015-12-23 DIAGNOSIS — Z96651 Presence of right artificial knee joint: Secondary | ICD-10-CM | POA: Diagnosis not present

## 2015-12-23 DIAGNOSIS — T84018A Broken internal joint prosthesis, other site, initial encounter: Secondary | ICD-10-CM | POA: Diagnosis not present

## 2015-12-23 DIAGNOSIS — M25361 Other instability, right knee: Secondary | ICD-10-CM | POA: Diagnosis not present

## 2015-12-25 ENCOUNTER — Encounter (HOSPITAL_COMMUNITY): Payer: Self-pay | Admitting: *Deleted

## 2015-12-25 ENCOUNTER — Emergency Department (HOSPITAL_COMMUNITY)
Admission: EM | Admit: 2015-12-25 | Discharge: 2015-12-25 | Disposition: A | Discharge status: Home / Self Care | Payer: Medicare Other | Attending: Emergency Medicine | Admitting: Emergency Medicine

## 2015-12-25 DIAGNOSIS — I10 Essential (primary) hypertension: Secondary | ICD-10-CM | POA: Insufficient documentation

## 2015-12-25 DIAGNOSIS — M25561 Pain in right knee: Secondary | ICD-10-CM | POA: Insufficient documentation

## 2015-12-25 DIAGNOSIS — Z79899 Other long term (current) drug therapy: Secondary | ICD-10-CM | POA: Insufficient documentation

## 2015-12-25 DIAGNOSIS — Z96651 Presence of right artificial knee joint: Secondary | ICD-10-CM | POA: Diagnosis not present

## 2015-12-25 DIAGNOSIS — Z471 Aftercare following joint replacement surgery: Secondary | ICD-10-CM | POA: Diagnosis not present

## 2015-12-25 MED ORDER — HYDROMORPHONE HCL 2 MG/ML IJ SOLN
2.0000 mg | Freq: Once | INTRAMUSCULAR | Status: AC
  Administered 2015-12-25: 2 mg via INTRAMUSCULAR
  Filled 2015-12-25: qty 1

## 2015-12-25 MED ORDER — DEXAMETHASONE SODIUM PHOSPHATE 10 MG/ML IJ SOLN
10.0000 mg | Freq: Once | INTRAMUSCULAR | Status: AC
  Administered 2015-12-25: 10 mg via INTRAMUSCULAR
  Filled 2015-12-25: qty 1

## 2015-12-25 MED ORDER — MORPHINE SULFATE 30 MG PO TABS: 30.0000 mg | ORAL_TABLET | Freq: Four times a day (QID) | ORAL | Status: DC | PRN

## 2015-12-25 NOTE — Discharge Instructions (Signed)
Read the information below.  Use the prescribed medication as directed.  Please discuss all new medications with your pharmacist.  You may return to the Emergency Department at any time for worsening condition or any new symptoms that concern you.  If you develop uncontrolled pain, weakness or numbness of the extremity, severe discoloration of the skin, or you are unable to walk or move your knee, return to the ER for a recheck.      Knee Pain Knee pain is a very common symptom and can have many causes. Knee pain often goes away when you follow your health care provider's instructions for relieving pain and discomfort at home. However, knee pain can develop into a condition that needs treatment. Some conditions may include:  Arthritis caused by wear and tear (osteoarthritis).  Arthritis caused by swelling and irritation (rheumatoid arthritis or gout).  A cyst or growth in your knee.  An infection in your knee joint.  An injury that will not heal.  Damage, swelling, or irritation of the tissues that support your knee (torn ligaments or tendinitis). If your knee pain continues, additional tests may be ordered to diagnose your condition. Tests may include X-rays or other imaging studies of your knee. You may also need to have fluid removed from your knee. Treatment for ongoing knee pain depends on the cause, but treatment may include:  Medicines to relieve pain or swelling.  Steroid injections in your knee.  Physical therapy.  Surgery. HOME CARE INSTRUCTIONS  Take medicines only as directed by your health care provider.  Rest your knee and keep it raised (elevated) while you are resting.  Do not do things that cause or worsen pain.  Avoid high-impact activities or exercises, such as running, jumping rope, or doing jumping jacks.  Apply ice to the knee area:  Put ice in a plastic bag.  Place a towel between your skin and the bag.  Leave the ice on for 20 minutes, 2-3 times a  day.  Ask your health care provider if you should wear an elastic knee support.  Keep a pillow under your knee when you sleep.  Lose weight if you are overweight. Extra weight can put pressure on your knee.  Do not use any tobacco products, including cigarettes, chewing tobacco, or electronic cigarettes. If you need help quitting, ask your health care provider. Smoking may slow the healing of any bone and joint problems that you may have. SEEK MEDICAL CARE IF:  Your knee pain continues, changes, or gets worse.  You have a fever along with knee pain.  Your knee buckles or locks up.  Your knee becomes more swollen. SEEK IMMEDIATE MEDICAL CARE IF:   Your knee joint feels hot to the touch.  You have chest pain or trouble breathing.   This information is not intended to replace advice given to you by your health care provider. Make sure you discuss any questions you have with your health care provider.   Document Released: 04/19/2007 Document Revised: 07/13/2014 Document Reviewed: 02/05/2014 Elsevier Interactive Patient Education [employer].

## 2015-12-25 NOTE — ED Notes (Signed)
PT DISCHARGED. INSTRUCTIONS AND PRESCRIPTION GIVEN. AAOX4. PT IN NO APPARENT DISTRESS. THE OPPORTUNITY TO ASK QUESTIONS WAS PROVIDED.

## 2015-12-25 NOTE — ED Notes (Addendum)
Patient has had bilateral knee replacement.  Since Saturday she has had right knee pain that has grown increasingly worse.  Patient saw rheumatologist and orthopedist today.  Dr. Alvan Dame said they were going to do a nuclear medicine scan, but that has not been scheduled.  Dr. Alvan Dame also prescribed prednisone.  The rheum doc prescribed a "pain" shot.  Patient states nothing has relieved her pain.  Patient denies trauma to knee.  Patient denies N/V/D and fever.  No swelling or deformity noted to either knee.  Patient has also taken Vicodin for pain, which is prescribed for rheumatoid arthritis.

## 2015-12-25 NOTE — ED Provider Notes (Signed)
CSN: [id number]     Arrival date & time 12/25/15  1839 History   By signing my name below, I, Rowan Blase, attest that this documentation has been prepared under the direction and in the presence of non-physician practitioner, Clayton Bibles, PA-C. Electronically Signed: Rowan Blase, Scribe. 12/25/2015. 8:07 PM.    Chief Complaint  Patient presents with  . Knee Pain    right    The history is provided by the patient. No language interpreter was used.   HPI Comments:  Helen Hardy is a 65 y.o. female with PMHx of BL knee replacement, Hodgkin's Lymphoma in remission, and rheumatoid arthritis who presents to the Emergency Department complaining of worsening, gradual onset right knee pain for the past 5 days. Pt reports associated pain throughout right leg from "toes to groin"; she is having difficulty walking secondary to pain. Pain worsens with bending her right knee or putting weight on her right leg. She states her right knee replacement is ~67 years old. Pt saw rheumatologist and orthopedist today; she was started on prednisone taper and given a "pain" shot in her hip. She states the orthopedist did not examine her knee; they took x-rays of her knee earlier this week. Pt has taken 3 prednisone (65m total) and 2 hydrocodone today without relief. She reports taking hydrocodone and prednisone every day. Denies fever, leg swelling, injury or trauma, or left leg pain.  Past Medical History  Diagnosis Date  . Arthritis     Rheumatoid  . Cancer ([place]     Hodkins Lymphoma  . Hypertension   . Pneumonia   . GERD (gastroesophageal reflux disease)   . Anemia   . Malabsorption of iron 06/21/2015   Past Surgical History  Procedure Laterality Date  . Rotator cuff repair    . Joint replacement      both knee replacement  . Bladder tack - 2005    . Abdominal hysterectomy    . 2012 right big toe and toe beside - had pins in toes    . Eye surgery      cataract  . Total knee revision  Left 04/08/2015    Procedure: REVISION LEFT TOTAL KNEE ;  Surgeon: MParalee Cancel MD;  Location: [facility];  Service: Orthopedics;  Laterality: Left;   No family history on file. Social History  Substance Use Topics  . Smoking status: Never Smoker   . Smokeless tobacco: Never Used  . Alcohol Use: No   OB History    No data available     Review of Systems  Constitutional: Negative for fever.  Cardiovascular: Negative for leg swelling.  Musculoskeletal: Positive for arthralgias. Negative for joint swelling.  Skin: Negative for color change and pallor.  Psychiatric/Behavioral: Negative for self-injury.    Allergies  Oxycodone  Home Medications   Prior to Admission medications   Medication Sig Start Date End Date Taking? Authorizing Provider  amLODipine (NORVASC) 10 MG tablet Take 10 mg by mouth daily.    Historical Provider, MD  Cholecalciferol (VITAMIN D) 2000 UNITS CAPS Take 2,000 Units by mouth 2 (two) times daily.    Historical Provider, MD  cyclobenzaprine (FLEXERIL) 10 MG tablet Take 0.5 tablets (5 mg total) by mouth 3 (three) times daily as needed for muscle spasms. 04/10/15   MDanae Orleans PA-C  docusate sodium (COLACE) 100 MG capsule Take 1 capsule (100 mg total) by mouth 2 (two) times daily. 04/10/15   MDanae Orleans PA-C  estradiol (ESTRACE) 1 MG tablet Take  1 mg by mouth daily.  12/25/12   Historical Provider, MD  ferrous sulfate 325 (65 FE) MG tablet Take 1 tablet (325 mg total) by mouth 3 (three) times daily after meals. 04/10/15   Danae Orleans, PA-C  folic acid (FOLVITE) 329 MCG tablet Take 800 mcg by mouth 2 (two) times daily.    Historical Provider, MD  gabapentin (NEURONTIN) 300 MG capsule Take 300 mg by mouth 2 (two) times daily.    Historical Provider, MD  HYDROcodone-acetaminophen (NORCO) 7.5-325 MG tablet Take 1-2 tablets by mouth every 4 (four) hours as needed for moderate pain. 04/10/15   Danae Orleans, PA-C  leflunomide (ARAVA) 10 MG tablet  06/18/15    Historical Provider, MD  Multiple Vitamin (MULTIVITAMIN) tablet Take 1 tablet by mouth daily.    Historical Provider, MD  pantoprazole (PROTONIX) 40 MG tablet Take 40 mg by mouth 2 (two) times daily.     Historical Provider, MD  predniSONE (DELTASONE) 5 MG tablet Take 5 mg by mouth daily with breakfast.  10/29/14   Historical Provider, MD  ranitidine (ZANTAC) 150 MG capsule Take 150 mg by mouth every evening.    Historical Provider, MD  vitamin B-12 (CYANOCOBALAMIN) 1000 MCG tablet Take 1,000 mcg by mouth daily.    Historical Provider, MD   BP 144/99 mmHg  Pulse 92  Temp(Src) 99.1 F (37.3 C) (Oral)  Resp 20  SpO2 97%   Physical Exam  Constitutional: She appears well-developed and well-nourished. No distress.  HENT:  Head: Normocephalic and atraumatic.  Neck: Neck supple.  Pulmonary/Chest: Effort normal.  Musculoskeletal:  Right Knee: full active ROM, no erythema, edema, warmth or focal tenderness; RLE is without edema or TTP throughout; distal pulses and sensation are intact  Neurological: She is alert. She exhibits normal muscle tone.  Skin: She is not diaphoretic.  Nursing note and vitals reviewed.   ED Course  Procedures  DIAGNOSTIC STUDIES:  Oxygen Saturation is 97% on RA, normal by my interpretation.    COORDINATION OF CARE:  8:06 PM Will administer medication. Advised pt to follow-up with ortho. Discussed treatment plan with pt at bedside and pt agreed to plan.  Labs Review Labs Reviewed - No data to display  Imaging Review No results found. I have personally reviewed and evaluated these images and lab results as part of my medical decision-making.   EKG Interpretation None      MDM   Final diagnoses:  Right knee pain    Afebrile, nontoxic patient with uncontrolled pain in right knee that is chronic pain.  Has seen orthopedist and rheumatologist today, continues to have significant pain.  Xrays also performed at orthopedic office.  No injury.   Neurovascularly intact.  No clinical e/o septic joint. Pain controlled in ED.   D/C home with small amount of pain medication, close orthopedic follow up.  Discussed result, findings, treatment, and follow up  with patient.  Pt given return precautions.  Pt verbalizes understanding and agrees with plan.        I personally performed the services described in this documentation, which was scribed in my presence. The recorded information has been reviewed and is accurate.    Clayton Bibles, PA-C 12/25/15 2133  Virgel Manifold, MD 12/27/15 2202

## 2015-12-26 ENCOUNTER — Other Ambulatory Visit (HOSPITAL_COMMUNITY): Payer: Self-pay | Admitting: Orthopedic Surgery

## 2015-12-26 DIAGNOSIS — M25561 Pain in right knee: Secondary | ICD-10-CM

## 2015-12-31 ENCOUNTER — Emergency Department (HOSPITAL_COMMUNITY)
Admission: EM | Admit: 2015-12-31 | Discharge: 2015-12-31 | Disposition: A | Discharge status: Home / Self Care | Payer: Medicare Other | Attending: Emergency Medicine | Admitting: Emergency Medicine

## 2015-12-31 ENCOUNTER — Encounter (HOSPITAL_COMMUNITY): Payer: Self-pay | Admitting: *Deleted

## 2015-12-31 DIAGNOSIS — Z79899 Other long term (current) drug therapy: Secondary | ICD-10-CM | POA: Insufficient documentation

## 2015-12-31 DIAGNOSIS — I10 Essential (primary) hypertension: Secondary | ICD-10-CM | POA: Diagnosis not present

## 2015-12-31 DIAGNOSIS — M1990 Unspecified osteoarthritis, unspecified site: Secondary | ICD-10-CM | POA: Insufficient documentation

## 2015-12-31 DIAGNOSIS — M25561 Pain in right knee: Secondary | ICD-10-CM | POA: Diagnosis not present

## 2015-12-31 DIAGNOSIS — Z8571 Personal history of Hodgkin lymphoma: Secondary | ICD-10-CM | POA: Insufficient documentation

## 2015-12-31 MED ORDER — MORPHINE SULFATE 30 MG PO TABS: 30.0000 mg | ORAL_TABLET | Freq: Four times a day (QID) | ORAL | Status: DC | PRN

## 2015-12-31 MED ORDER — KETOROLAC TROMETHAMINE 30 MG/ML IJ SOLN
30.0000 mg | Freq: Once | INTRAMUSCULAR | Status: AC
  Administered 2015-12-31: 30 mg via INTRAMUSCULAR
  Filled 2015-12-31: qty 1

## 2015-12-31 MED ORDER — HYDROMORPHONE HCL 1 MG/ML IJ SOLN
1.0000 mg | Freq: Once | INTRAMUSCULAR | Status: AC
  Administered 2015-12-31: 1 mg via INTRAMUSCULAR
  Filled 2015-12-31: qty 1

## 2015-12-31 NOTE — ED Provider Notes (Signed)
CSN: [id number]     Arrival date & time 12/31/15  0617 History   First MD Initiated Contact with Patient 12/31/15 0725     Chief Complaint  Patient presents with  . Knee Pain     (Consider location/radiation/quality/duration/timing/severity/associated sxs/prior Treatment) HPI Comments: Patient with a history of RA and bilateral knee replacements presents today with pain of the right knee.  Pain has been present for several year, but worsened last week.  No acute injury or trauma.  She has been taking Norco for the pain, but does not feel that it helped.  She was seen in the ED six days ago for the same.  At that time she was given Rx for Morphine tablets.  She states that the Morphine was helping, but she ran out of the medication yesterday.  She states that she is also currently taking Prednisone, but does not feel that it is helping.  She is followed by Dr. Alvan Dame with Ortho for this pain and states that she has an appointment in 3 days.  She states that her knee replacement was done on her right knee 21 years ago and she has been told that she is due for another replacement.  She states associated swelling of the knee.  She is able to move the knee without difficulty.  Denies fever, chills, numbness, or tingling.  Patient is a 65 y.o. female presenting with knee pain. The history is provided by the patient.  Knee Pain   Past Medical History  Diagnosis Date  . Arthritis     Rheumatoid  . Cancer ([place])     Hodkins Lymphoma  . Hypertension   . Pneumonia   . GERD (gastroesophageal reflux disease)   . Anemia   . Malabsorption of iron 06/21/2015   Past Surgical History  Procedure Laterality Date  . Rotator cuff repair    . Joint replacement      both knee replacement  . Bladder tack - 2005    . Abdominal hysterectomy    . 2012 right big toe and toe beside - had pins in toes    . Eye surgery      cataract  . Total knee revision Left 04/08/2015    Procedure: REVISION LEFT TOTAL KNEE ;   Surgeon: Paralee Cancel, MD;  Location: [facility];  Service: Orthopedics;  Laterality: Left;   No family history on file. Social History  Substance Use Topics  . Smoking status: Never Smoker   . Smokeless tobacco: Never Used  . Alcohol Use: No   OB History    No data available     Review of Systems  All other systems reviewed and are negative.     Allergies  Oxycodone  Home Medications   Prior to Admission medications   Medication Sig Start Date End Date Taking? Authorizing Provider  amLODipine (NORVASC) 10 MG tablet Take 10 mg by mouth daily.   Yes Historical Provider, MD  CHERATUSSIN AC 100-10 MG/5ML syrup  12/24/15  Yes Historical Provider, MD  chlorpheniramine-HYDROcodone ([place]) 10-8 MG/5ML SUER  11/21/15  Yes Historical Provider, MD  cyclobenzaprine (FLEXERIL) 10 MG tablet Take 0.5 tablets (5 mg total) by mouth 3 (three) times daily as needed for muscle spasms. 04/10/15  Yes Danae Orleans, PA-C  estradiol (ESTRACE) 1 MG tablet Take 1 mg by mouth daily.  12/25/12  Yes Historical Provider, MD  folic acid (FOLVITE) 409 MCG tablet Take 800 mcg by mouth 2 (two) times daily.   Yes Historical  Provider, MD  gabapentin (NEURONTIN) 300 MG capsule Take 300 mg by mouth 2 (two) times daily.   Yes Historical Provider, MD  HYDROcodone-acetaminophen (NORCO/VICODIN) 5-325 MG tablet Take 1 tablet by mouth 3 (three) times daily as needed for moderate pain.  11/29/15  Yes Historical Provider, MD  leflunomide (ARAVA) 10 MG tablet Take 10 mg by mouth daily.  06/18/15  Yes Historical Provider, MD  morphine (MSIR) 30 MG tablet Take 1 tablet (30 mg total) by mouth every 6 (six) hours as needed for severe pain. 12/25/15  Yes Clayton Bibles, PA-C  Multiple Vitamin (MULTIVITAMIN WITH MINERALS) TABS tablet Take 1 tablet by mouth daily.   Yes Historical Provider, MD  pantoprazole (PROTONIX) 40 MG tablet Take 40 mg by mouth 2 (two) times daily.    Yes Historical Provider, MD  predniSONE (DELTASONE) 5 MG tablet  Take 5-10 mg by mouth See admin instructions. Take for 12 days starting on 12/25/15. Days 1-4: Two tablets before breakfast, one after lunch, one after dinner, and two at bedtime. If started late in the day, take two tablets every hour for three hours, unless otherwise directed by prescriber. Days 5-8: One tablet before breakfast, one after lunch, one after dinner, and one at bedtime Days 9-12: One tablet before breakfast and one at bedtime 10/29/14  Yes Historical Provider, MD  ranitidine (ZANTAC) 150 MG capsule Take 150 mg by mouth at bedtime.    Yes Historical Provider, MD  vitamin B-12 (CYANOCOBALAMIN) 1000 MCG tablet Take 1,000 mcg by mouth daily.   Yes Historical Provider, MD   BP 158/88 mmHg  Pulse 77  Temp(Src) 97.6 F (36.4 C) (Oral)  Resp 20  Ht 5' (1.524 m)  Wt 104.327 kg  BMI 44.92 kg/m2  SpO2 100% Physical Exam  Constitutional: She appears well-developed and well-nourished.  HENT:  Head: Normocephalic and atraumatic.  Neck: Normal range of motion. Neck supple.  Cardiovascular: Normal rate, regular rhythm and normal heart sounds.   Pulses:      Dorsalis pedis pulses are 2+ on the right side.  Pulmonary/Chest: Effort normal and breath sounds normal.  Musculoskeletal: Normal range of motion.       Right knee: She exhibits swelling. She exhibits normal range of motion. Tenderness found.  NO erythema or warmth of the right knee Diffuse tenderness to palpation of the right knee  Neurological: She is alert.  Distal sensation of right foot intact  Skin: Skin is warm and dry.  Psychiatric: She has a normal mood and affect.  Nursing note and vitals reviewed.   ED Course  Procedures (including critical care time) Labs Review Labs Reviewed - No data to display  Imaging Review No results found. I have personally reviewed and evaluated these images and lab results as part of my medical decision-making.   EKG Interpretation None      MDM   Final diagnoses:  None    Patient with a history of bilateral knee replacements and RA presents today with right knee pain.  No acute injury or trauma.  Pain is chronic.  She is followed by Dr Alvan Dame with Orthopedics for this pain and states that she has an appointment scheduled in 3 days.  No signs of infection or septic joint.  Full ROM of the knee.  She is afebrile.  Patient given pain medication in the ED with significant improvement in pain.  Patient discharged home with short course of pain medication.  Return precautions given.    Hyman Bible, PA-C 12/31/15  [place], MD 01/01/16 1001

## 2015-12-31 NOTE — ED Notes (Signed)
Pt was seen on 6/21 for rt knee pain; pt states that she ran out of medications on Sun; pt states that she is awaiting a knee replacement; pt states that she saw her Ortho and is to have a MRI on Fri; pt states that she takes hydrocodone for pain every day for her RA but states that her pain is not controlled and is unable to rest

## 2016-01-03 ENCOUNTER — Encounter (HOSPITAL_COMMUNITY)
Admission: RE | Admit: 2016-01-03 | Discharge: 2016-01-03 | Disposition: A | Discharge status: Home / Self Care | Payer: Medicare Other | Source: Ambulatory Visit | Attending: Orthopedic Surgery | Admitting: Orthopedic Surgery

## 2016-01-03 DIAGNOSIS — R948 Abnormal results of function studies of other organs and systems: Secondary | ICD-10-CM | POA: Diagnosis not present

## 2016-01-03 DIAGNOSIS — M25561 Pain in right knee: Secondary | ICD-10-CM | POA: Insufficient documentation

## 2016-01-03 MED ORDER — TECHNETIUM TC 99M MEDRONATE IV KIT
24.2000 | PACK | Freq: Once | INTRAVENOUS | Status: AC | PRN
  Administered 2016-01-03: 24.2 via INTRAVENOUS

## 2016-01-09 DIAGNOSIS — M79604 Pain in right leg: Secondary | ICD-10-CM | POA: Diagnosis not present

## 2016-01-09 DIAGNOSIS — Z471 Aftercare following joint replacement surgery: Secondary | ICD-10-CM | POA: Diagnosis not present

## 2016-01-09 DIAGNOSIS — M5417 Radiculopathy, lumbosacral region: Secondary | ICD-10-CM | POA: Diagnosis not present

## 2016-01-13 DIAGNOSIS — M0589 Other rheumatoid arthritis with rheumatoid factor of multiple sites: Secondary | ICD-10-CM | POA: Diagnosis not present

## 2016-01-13 DIAGNOSIS — M545 Low back pain: Secondary | ICD-10-CM | POA: Diagnosis not present

## 2016-01-13 DIAGNOSIS — M0579 Rheumatoid arthritis with rheumatoid factor of multiple sites without organ or systems involvement: Secondary | ICD-10-CM | POA: Diagnosis not present

## 2016-01-13 DIAGNOSIS — E539 Vitamin B deficiency, unspecified: Secondary | ICD-10-CM | POA: Diagnosis not present

## 2016-01-13 DIAGNOSIS — D649 Anemia, unspecified: Secondary | ICD-10-CM | POA: Diagnosis not present

## 2016-01-13 DIAGNOSIS — Z79899 Other long term (current) drug therapy: Secondary | ICD-10-CM | POA: Diagnosis not present

## 2016-01-16 DIAGNOSIS — M5417 Radiculopathy, lumbosacral region: Secondary | ICD-10-CM | POA: Diagnosis not present

## 2016-01-16 DIAGNOSIS — M5136 Other intervertebral disc degeneration, lumbar region: Secondary | ICD-10-CM | POA: Diagnosis not present

## 2016-01-16 DIAGNOSIS — M5416 Radiculopathy, lumbar region: Secondary | ICD-10-CM | POA: Diagnosis not present

## 2016-01-24 DIAGNOSIS — K8010 Calculus of gallbladder with chronic cholecystitis without obstruction: Secondary | ICD-10-CM | POA: Diagnosis not present

## 2016-01-24 DIAGNOSIS — M5416 Radiculopathy, lumbar region: Secondary | ICD-10-CM | POA: Diagnosis not present

## 2016-01-31 DIAGNOSIS — Z96651 Presence of right artificial knee joint: Secondary | ICD-10-CM | POA: Diagnosis not present

## 2016-01-31 DIAGNOSIS — Z471 Aftercare following joint replacement surgery: Secondary | ICD-10-CM | POA: Diagnosis not present

## 2016-02-10 DIAGNOSIS — M0589 Other rheumatoid arthritis with rheumatoid factor of multiple sites: Secondary | ICD-10-CM | POA: Diagnosis not present

## 2016-02-11 DIAGNOSIS — M5136 Other intervertebral disc degeneration, lumbar region: Secondary | ICD-10-CM | POA: Diagnosis not present

## 2016-02-11 DIAGNOSIS — M5417 Radiculopathy, lumbosacral region: Secondary | ICD-10-CM | POA: Diagnosis not present

## 2016-02-11 DIAGNOSIS — M79604 Pain in right leg: Secondary | ICD-10-CM | POA: Diagnosis not present

## 2016-02-17 DIAGNOSIS — M5127 Other intervertebral disc displacement, lumbosacral region: Secondary | ICD-10-CM | POA: Diagnosis not present

## 2016-02-17 DIAGNOSIS — M5136 Other intervertebral disc degeneration, lumbar region: Secondary | ICD-10-CM | POA: Diagnosis not present

## 2016-02-17 DIAGNOSIS — M5417 Radiculopathy, lumbosacral region: Secondary | ICD-10-CM | POA: Diagnosis not present

## 2016-02-18 DIAGNOSIS — R05 Cough: Secondary | ICD-10-CM | POA: Diagnosis not present

## 2016-02-18 DIAGNOSIS — R490 Dysphonia: Secondary | ICD-10-CM | POA: Diagnosis not present

## 2016-02-19 ENCOUNTER — Other Ambulatory Visit (HOSPITAL_BASED_OUTPATIENT_CLINIC_OR_DEPARTMENT_OTHER): Payer: Medicare Other

## 2016-02-19 ENCOUNTER — Ambulatory Visit (HOSPITAL_BASED_OUTPATIENT_CLINIC_OR_DEPARTMENT_OTHER): Payer: Medicare Other | Admitting: Hematology & Oncology

## 2016-02-19 ENCOUNTER — Ambulatory Visit: Payer: Medicare Other

## 2016-02-19 VITALS — BP 131/101 | HR 94 | Temp 98.0°F | Resp 20 | Wt 226.0 lb

## 2016-02-19 DIAGNOSIS — M069 Rheumatoid arthritis, unspecified: Secondary | ICD-10-CM | POA: Diagnosis not present

## 2016-02-19 DIAGNOSIS — Z8572 Personal history of non-Hodgkin lymphomas: Secondary | ICD-10-CM | POA: Diagnosis not present

## 2016-02-19 DIAGNOSIS — K909 Intestinal malabsorption, unspecified: Secondary | ICD-10-CM

## 2016-02-19 DIAGNOSIS — D509 Iron deficiency anemia, unspecified: Secondary | ICD-10-CM | POA: Diagnosis not present

## 2016-02-19 LAB — IRON AND TIBC
%SAT: 47 % (ref 21–57)
Iron: 120 ug/dL (ref 41–142)
TIBC: 254 ug/dL (ref 236–444)
UIBC: 134 ug/dL (ref 120–384)

## 2016-02-19 LAB — CBC WITH DIFFERENTIAL (CANCER CENTER ONLY)
BASO#: 0 10*3/uL (ref 0.0–0.2)
BASO%: 0.4 % (ref 0.0–2.0)
EOS%: 1.1 % (ref 0.0–7.0)
Eosinophils Absolute: 0.1 10*3/uL (ref 0.0–0.5)
HCT: 34.1 % — ABNORMAL LOW (ref 34.8–46.6)
HGB: 11.7 g/dL (ref 11.6–15.9)
LYMPH#: 2.3 10*3/uL (ref 0.9–3.3)
LYMPH%: 43.6 % (ref 14.0–48.0)
MCH: 35.6 pg — ABNORMAL HIGH (ref 26.0–34.0)
MCHC: 34.3 g/dL (ref 32.0–36.0)
MCV: 104 fL — ABNORMAL HIGH (ref 81–101)
MONO#: 0.6 10*3/uL (ref 0.1–0.9)
MONO%: 12.1 % (ref 0.0–13.0)
NEUT#: 2.3 10*3/uL (ref 1.5–6.5)
NEUT%: 42.8 % (ref 39.6–80.0)
Platelets: 168 10*3/uL (ref 145–400)
RBC: 3.29 10*6/uL — ABNORMAL LOW (ref 3.70–5.32)
RDW: 14.6 % (ref 11.1–15.7)
WBC: 5.3 10*3/uL (ref 3.9–10.0)

## 2016-02-19 LAB — COMPREHENSIVE METABOLIC PANEL
ALT: 13 U/L (ref 0–55)
AST: 19 U/L (ref 5–34)
Albumin: 3.5 g/dL (ref 3.5–5.0)
Alkaline Phosphatase: 65 U/L (ref 40–150)
Anion Gap: 9 mEq/L (ref 3–11)
BUN: 10.4 mg/dL (ref 7.0–26.0)
CO2: 23 mEq/L (ref 22–29)
Calcium: 9.8 mg/dL (ref 8.4–10.4)
Chloride: 110 mEq/L — ABNORMAL HIGH (ref 98–109)
Creatinine: 0.8 mg/dL (ref 0.6–1.1)
EGFR: >90 mL/min/{1.73_m2} (ref >90)
Glucose: 99 mg/dl (ref 70–140)
Potassium: 3.7 mEq/L (ref 3.5–5.1)
Sodium: 143 mEq/L (ref 136–145)
Total Bilirubin: 0.47 mg/dL (ref 0.20–1.20)
Total Protein: 6.8 g/dL (ref 6.4–8.3)

## 2016-02-19 LAB — FERRITIN: Ferritin: 672 ng/ml — ABNORMAL HIGH (ref 9–269)

## 2016-02-19 LAB — RETICULOCYTES: Reticulocyte Count: 1.9 % (ref 0.6–2.6)

## 2016-02-19 NOTE — Progress Notes (Signed)
Hematology and Oncology Follow Up Visit  Helen Hardy [id number] 06-Jul-1951 65 y.o. 02/19/2016   Principle Diagnosis:  1. 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 Rheumatoid arthritis 3. Anemia of chronic disease 4.  Iron deficiency anemia  Current Therapy:   IV Iron as indicated    Interim History:  Helen Hardy is here today for a follow-up. Unfortunately, she will need back surgery. She apparently has a herniated disc at sounds like L4-5. She'll have surgery on August 30.  From my point of view, I do not see process of her having surgery. Her lymphoma is a non-issue. Her iron deficiency has been improved. She does get Aranesp on occasion.  She's having problems with her right leg. She did have an process for the past couple months. She finally went to the orthopedic surgeon. An MRI was done. This showed the problem with the herniated disc.  There's been no problems with bleeding or bruising. She's had low constipation from pain medicine.  There's been no issues with fever. She's had no cough or shortness of breath. She's had no rashes.  She has bad rheumatoid arthritis. This is being managed by rheumatology.     Overall, her performance status is ECOG 2.   Medications:    Medication List       Accurate as of 02/19/16  8:30 AM. Always use your most recent med list.          amLODipine 10 MG tablet Commonly known as:  NORVASC Take 10 mg by mouth daily.   estradiol 1 MG tablet Commonly known as:  ESTRACE Take 1 mg by mouth daily.   folic acid 572 MCG tablet Commonly known as:  FOLVITE Take 800 mcg by mouth 2 (two) times daily.   HYDROcodone-acetaminophen 10-325 MG tablet Commonly known as:  NORCO 10-325 tablets.   leflunomide 10 MG tablet Commonly known as:  ARAVA Take 10 mg by mouth daily.   multivitamin with minerals Tabs tablet Take 1 tablet by mouth daily.   pantoprazole 40 MG tablet Commonly known as:   PROTONIX Take 40 mg by mouth 2 (two) times daily.   predniSONE 5 MG tablet Commonly known as:  DELTASONE Take 5-10 mg by mouth See admin instructions. Take for 12 days starting on 12/25/15. Days 1-4: Two tablets before breakfast, one after lunch, one after dinner, and two at bedtime. If started late in the day, take two tablets every hour for three hours, unless otherwise directed by prescriber. Days 5-8: One tablet before breakfast, one after lunch, one after dinner, and one at bedtime Days 9-12: One tablet before breakfast and one at bedtime   triamcinolone cream 0.1 % Commonly known as:  KENALOG Apply 1 application topically as needed (Apply to affected area.).   vitamin B-12 1000 MCG tablet Commonly known as:  CYANOCOBALAMIN Take 1,000 mcg by mouth daily.       Allergies:  Allergies  Allergen Reactions  . Oxycodone Nausea And Vomiting    Past Medical History, Surgical history, Social history, and Family History were reviewed and updated.  Review of Systems: All other 10 point review of systems is negative.   Physical Exam:  vitals were not taken for this visit.  Wt Readings from Last 3 Encounters:  12/31/15 230 lb (104.3 kg)  11/20/15 232 lb (105.2 kg)  08/22/15 241 lb (109.3 kg)    Obese African-American female. Head and neck exam shows no ocular or oral lesions. She has no palpable cervical  or supraclavicular lymph nodes. Lungs are clear. Cardiac exam regular rate and rhythm with no murmurs, rubs or bruits. Abdomen is soft. She has good bowel sounds. There is no fluid wave. There is no palpable liver or spleen tip. Back exam shows no tenderness over the spine, ribs or hips. Extremities shows no clubbing, cyanosis or edema. She is joint changes consistent with her rheumatoid arthritis. Skin exam shows no rashes, ecchymoses or petechia. Neurological exam shows no focal deficits.   Lab Results  Component Value Date   WBC 5.3 02/19/2016   HGB 11.7 02/19/2016   HCT 34.1  (L) 02/19/2016   MCV 104 (H) 02/19/2016   PLT 168 02/19/2016   Lab Results  Component Value Date   FERRITIN 687 (H) 11/20/2015   IRON 97 11/20/2015   TIBC 225 (L) 11/20/2015   UIBC 128 11/20/2015   IRONPCTSAT 43 11/20/2015   Lab Results  Component Value Date   RETICCTPCT 1.4 06/21/2015   RBC 3.29 (L) 02/19/2016   RETICCTABS 41.0 06/21/2015   No results found for: KPAFRELGTCHN, LAMBDASER, KAPLAMBRATIO No results found for: IGGSERUM, IGA, IGMSERUM No results found for: Odetta Pink, SPEI   Chemistry      Component Value Date/Time   NA 142 11/20/2015 0747   K 3.7 11/20/2015 0747   CL 103 06/21/2015 0800   CO2 22 11/20/2015 0747   BUN 16.4 11/20/2015 0747   CREATININE 0.8 11/20/2015 0747      Component Value Date/Time   CALCIUM 9.6 11/20/2015 0747   ALKPHOS 55 11/20/2015 0747   AST 16 11/20/2015 0747   ALT 16 11/20/2015 0747   BILITOT 0.42 11/20/2015 0747     Impression and Plan: Helen Hardy is 65 yo African [place] female with past history of low-grade non-Hodgkin's lymphoma. She completed treatment over 13 years ago and so far there has been no evidence of recurrence.   Her blood count is doing quite well today. I do not see any issues with her having back surgery. From my point of view, I think she should do okay. If she does have sickle cell trait. Again I don't think this should cause any problems for her.   I will plan to get her back to see [place] in another 3 months. I want to make sure that she is okay to be able to come to the office.   Volanda Napoleon, MD 8/16/20178:30 AM

## 2016-02-21 ENCOUNTER — Ambulatory Visit: Payer: Self-pay | Admitting: Physician Assistant

## 2016-02-21 DIAGNOSIS — I10 Essential (primary) hypertension: Secondary | ICD-10-CM | POA: Diagnosis not present

## 2016-02-21 DIAGNOSIS — Z01818 Encounter for other preprocedural examination: Secondary | ICD-10-CM | POA: Diagnosis not present

## 2016-02-27 ENCOUNTER — Encounter (HOSPITAL_COMMUNITY): Payer: Self-pay

## 2016-02-27 ENCOUNTER — Encounter (HOSPITAL_COMMUNITY)
Admission: RE | Admit: 2016-02-27 | Discharge: 2016-02-27 | Disposition: A | Discharge status: Home / Self Care | Payer: Medicare Other | Source: Ambulatory Visit | Attending: Orthopedic Surgery | Admitting: Orthopedic Surgery

## 2016-02-27 DIAGNOSIS — I10 Essential (primary) hypertension: Secondary | ICD-10-CM | POA: Diagnosis not present

## 2016-02-27 DIAGNOSIS — Z01818 Encounter for other preprocedural examination: Secondary | ICD-10-CM | POA: Insufficient documentation

## 2016-02-27 DIAGNOSIS — Z8572 Personal history of non-Hodgkin lymphomas: Secondary | ICD-10-CM | POA: Diagnosis not present

## 2016-02-27 DIAGNOSIS — M5127 Other intervertebral disc displacement, lumbosacral region: Secondary | ICD-10-CM | POA: Diagnosis not present

## 2016-02-27 DIAGNOSIS — K219 Gastro-esophageal reflux disease without esophagitis: Secondary | ICD-10-CM | POA: Insufficient documentation

## 2016-02-27 DIAGNOSIS — Z79899 Other long term (current) drug therapy: Secondary | ICD-10-CM | POA: Diagnosis not present

## 2016-02-27 DIAGNOSIS — D649 Anemia, unspecified: Secondary | ICD-10-CM | POA: Diagnosis not present

## 2016-02-27 DIAGNOSIS — M069 Rheumatoid arthritis, unspecified: Secondary | ICD-10-CM | POA: Diagnosis not present

## 2016-02-27 DIAGNOSIS — Z01812 Encounter for preprocedural laboratory examination: Secondary | ICD-10-CM | POA: Diagnosis not present

## 2016-02-27 DIAGNOSIS — Z7952 Long term (current) use of systemic steroids: Secondary | ICD-10-CM | POA: Diagnosis not present

## 2016-02-27 HISTORY — DX: Anesthesia of skin: R20.0

## 2016-02-27 HISTORY — DX: Paresthesia of skin: R20.2

## 2016-02-27 LAB — BASIC METABOLIC PANEL
Anion gap: 7 (ref 5–15)
BUN: 10 mg/dL (ref 6–20)
CO2: 23 mmol/L (ref 22–32)
Calcium: 9.7 mg/dL (ref 8.9–10.3)
Chloride: 108 mmol/L (ref 101–111)
Creatinine, Ser: 0.83 mg/dL (ref 0.44–1.00)
GFR calc Af Amer: >60 mL/min (ref >60)
GFR calc non Af Amer: >60 mL/min (ref >60)
Glucose, Bld: 107 mg/dL — ABNORMAL HIGH (ref 65–99)
Potassium: 3.9 mmol/L (ref 3.5–5.1)
Sodium: 138 mmol/L (ref 135–145)

## 2016-02-27 LAB — CBC
HCT: 35.6 % — ABNORMAL LOW (ref 36.0–46.0)
Hemoglobin: 11.7 g/dL — ABNORMAL LOW (ref 12.0–15.0)
MCH: 34.4 pg — ABNORMAL HIGH (ref 26.0–34.0)
MCHC: 32.9 g/dL (ref 30.0–36.0)
MCV: 104.7 fL — ABNORMAL HIGH (ref 78.0–100.0)
Platelets: 169 10*3/uL (ref 150–400)
RBC: 3.4 MIL/uL — ABNORMAL LOW (ref 3.87–5.11)
RDW: 15.2 % (ref 11.5–15.5)
WBC: 5.2 10*3/uL (ref 4.0–10.5)

## 2016-02-27 LAB — SURGICAL PCR SCREEN
MRSA, PCR: NEGATIVE
Staphylococcus aureus: NEGATIVE

## 2016-02-27 NOTE — Pre-Procedure Instructions (Signed)
Beatris A Porter-Jones  02/27/2016      [facility] ([address]), [place] [address] [place] [address] Phone: [phone] Fax: [phone]    Your procedure is scheduled on Wednesday, August 30th, 2017.  Report to [facility] Admitting at 8:30 A.M.   Call this number if you have problems the morning of surgery:  [phone]   Remember:  Do not eat food or drink liquids after midnight.   Take these medicines the morning of surgery with A SIP OF WATER: Amlodipine (Norvasc), Hydrocodone-Acetaminophen (Norco) if needed, Leflunomide ([place]), Pantoprazole (Protonix), Prednisone (Deltasone).  Stop taking: Aspirin, NSAIDS, Aleve, Naproxen, Ibuprofen, Advil, Motrin, BCs, Goody's, Fish oil, all herbal medications, and all vitamins.    Do not wear jewelry, make-up or nail polish.  Do not wear lotions, powders, or perfumes, or deoderant.  Do not shave 48 hours prior to surgery.    Do not bring valuables to the hospital.  [facility] is not responsible for any belongings or valuables.  Contacts, dentures or bridgework may not be worn into surgery.  Leave your suitcase in the car.  After surgery it may be brought to your room.  For patients admitted to the hospital, discharge time will be determined by your treatment team.  Patients discharged the day of surgery will not be allowed to drive home.   Special instructions:  Preparing for Surgery.   Please read over the following fact sheets that you were given. MRSA Information    [place]- Preparing For Surgery  Before surgery, you can play an important role. Because skin is not sterile, your skin needs to be as free of germs as possible. You can reduce the number of germs on your skin by washing with CHG (chlorahexidine gluconate) Soap before surgery.  CHG is an antiseptic cleaner which kills germs and bonds with the skin to continue killing germs even after  washing.  Please do not use if you have an allergy to CHG or antibacterial soaps. If your skin becomes reddened/irritated stop using the CHG.  Do not shave (including legs and underarms) for at least 48 hours prior to first CHG shower. It is OK to shave your face.  Please follow these instructions carefully.   1. Shower the NIGHT BEFORE SURGERY and the MORNING OF SURGERY with CHG.   2. If you chose to wash your hair, wash your hair first as usual with your normal shampoo.  3. After you shampoo, rinse your hair and body thoroughly to remove the shampoo.  4. Use CHG as you would any other liquid soap. You can apply CHG directly to the skin and wash gently with a scrungie or a clean washcloth.   5. Apply the CHG Soap to your body ONLY FROM THE NECK DOWN.  Do not use on open wounds or open sores. Avoid contact with your eyes, ears, mouth and genitals (private parts). Wash genitals (private parts) with your normal soap.  6. Wash thoroughly, paying special attention to the area where your surgery will be performed.  7. Thoroughly rinse your body with warm water from the neck down.  8. DO NOT shower/wash with your normal soap after using and rinsing off the CHG Soap.  9. Pat yourself dry with a CLEAN TOWEL.   10. Wear CLEAN PAJAMAS   11. Place CLEAN SHEETS on your bed the night of your first shower and DO NOT SLEEP WITH PETS.  Day of Surgery: Do not apply any deodorants/lotions. Please wear clean clothes to the hospital/surgery center.

## 2016-02-27 NOTE — Progress Notes (Signed)
PCP - Dr. Deland Pretty Cardiologist - denies  EKG - 03/29/15 and requested recent from PCP CXR - 09/28/15  Echo/Stress test/Cardiac Cath - denies  Patient denies chest pain and shortness of breath at PAT appointment.

## 2016-02-28 NOTE — Progress Notes (Signed)
Anesthesia Chart Review:  Pt is a 65 year old female scheduled for R far lateral L5-S1 discectomy on 03/04/2016 with Melina Schools, MD.   - PCP is Deland Pretty, MD.  - Hematologist oncologist is Burney Gauze, MD who has cleared pt for surgery.   PMH includes:  HTN, anemia, non-Hodgkin lymphoma, RA, GERD. Never smoker. BMI 45.5. S/p revision L total knee 04/08/15.   Medications include: amlodipine, leflunomide, protonix, prednisone  Preoperative labs reviewed.  chest X-ray 09/28/15:  No active cardiopulmonary disease.  EKG 02/21/16: sinus rhythm with short PR syndrome  If no changes, I anticipate pt can proceed with surgery as scheduled.   Willeen Cass, FNP-BC [facility]/Anesthesiology Phone: [phone] 02/28/2016 4:30 PM

## 2016-03-04 ENCOUNTER — Encounter (HOSPITAL_COMMUNITY): Payer: Self-pay | Admitting: *Deleted

## 2016-03-04 ENCOUNTER — Ambulatory Visit (HOSPITAL_COMMUNITY): Payer: Medicare Other | Admitting: Certified Registered"

## 2016-03-04 ENCOUNTER — Ambulatory Visit (HOSPITAL_COMMUNITY): Payer: Medicare Other | Admitting: Emergency Medicine

## 2016-03-04 ENCOUNTER — Encounter (HOSPITAL_COMMUNITY)
Admission: RE | Disposition: A | Discharge status: Home / Self Care | Payer: Self-pay | Source: Ambulatory Visit | Attending: Orthopedic Surgery

## 2016-03-04 ENCOUNTER — Ambulatory Visit (HOSPITAL_COMMUNITY): Payer: Medicare Other

## 2016-03-04 ENCOUNTER — Observation Stay (HOSPITAL_COMMUNITY)
Admission: RE | Admit: 2016-03-04 | Discharge: 2016-03-05 | Disposition: A | Discharge status: Home / Self Care | Payer: Medicare Other | Source: Ambulatory Visit | Attending: Orthopedic Surgery | Admitting: Orthopedic Surgery

## 2016-03-04 DIAGNOSIS — G8929 Other chronic pain: Secondary | ICD-10-CM | POA: Diagnosis not present

## 2016-03-04 DIAGNOSIS — M549 Dorsalgia, unspecified: Secondary | ICD-10-CM | POA: Insufficient documentation

## 2016-03-04 DIAGNOSIS — M4317 Spondylolisthesis, lumbosacral region: Secondary | ICD-10-CM | POA: Insufficient documentation

## 2016-03-04 DIAGNOSIS — Z981 Arthrodesis status: Secondary | ICD-10-CM | POA: Diagnosis not present

## 2016-03-04 DIAGNOSIS — M5126 Other intervertebral disc displacement, lumbar region: Secondary | ICD-10-CM | POA: Diagnosis not present

## 2016-03-04 DIAGNOSIS — Z8572 Personal history of non-Hodgkin lymphomas: Secondary | ICD-10-CM | POA: Insufficient documentation

## 2016-03-04 DIAGNOSIS — Z79899 Other long term (current) drug therapy: Secondary | ICD-10-CM | POA: Diagnosis not present

## 2016-03-04 DIAGNOSIS — M5117 Intervertebral disc disorders with radiculopathy, lumbosacral region: Principal | ICD-10-CM | POA: Insufficient documentation

## 2016-03-04 DIAGNOSIS — K219 Gastro-esophageal reflux disease without esophagitis: Secondary | ICD-10-CM | POA: Insufficient documentation

## 2016-03-04 DIAGNOSIS — D649 Anemia, unspecified: Secondary | ICD-10-CM | POA: Diagnosis not present

## 2016-03-04 DIAGNOSIS — M069 Rheumatoid arthritis, unspecified: Secondary | ICD-10-CM | POA: Diagnosis not present

## 2016-03-04 DIAGNOSIS — Z6841 Body Mass Index (BMI) 40.0 and over, adult: Secondary | ICD-10-CM | POA: Diagnosis not present

## 2016-03-04 DIAGNOSIS — Z419 Encounter for procedure for purposes other than remedying health state, unspecified: Secondary | ICD-10-CM

## 2016-03-04 DIAGNOSIS — I10 Essential (primary) hypertension: Secondary | ICD-10-CM | POA: Diagnosis not present

## 2016-03-04 DIAGNOSIS — Z79890 Hormone replacement therapy (postmenopausal): Secondary | ICD-10-CM | POA: Diagnosis not present

## 2016-03-04 DIAGNOSIS — Z7952 Long term (current) use of systemic steroids: Secondary | ICD-10-CM | POA: Diagnosis not present

## 2016-03-04 DIAGNOSIS — M5127 Other intervertebral disc displacement, lumbosacral region: Secondary | ICD-10-CM | POA: Diagnosis not present

## 2016-03-04 DIAGNOSIS — Z96653 Presence of artificial knee joint, bilateral: Secondary | ICD-10-CM | POA: Diagnosis not present

## 2016-03-04 HISTORY — PX: LUMBAR LAMINECTOMY/DECOMPRESSION MICRODISCECTOMY: SHX5026

## 2016-03-04 SURGERY — LUMBAR LAMINECTOMY/DECOMPRESSION MICRODISCECTOMY
Anesthesia: General

## 2016-03-04 MED ORDER — ROCURONIUM BROMIDE 100 MG/10ML IV SOLN
INTRAVENOUS | Status: DC | PRN
  Administered 2016-03-04: 50 mg via INTRAVENOUS
  Administered 2016-03-04: 10 mg via INTRAVENOUS

## 2016-03-04 MED ORDER — SODIUM CHLORIDE 0.9% FLUSH
3.0000 mL | Freq: Two times a day (BID) | INTRAVENOUS | Status: DC
  Administered 2016-03-04: 3 mL via INTRAVENOUS

## 2016-03-04 MED ORDER — MIDAZOLAM HCL 2 MG/2ML IJ SOLN
INTRAMUSCULAR | Status: DC | PRN
  Administered 2016-03-04: 2 mg via INTRAVENOUS

## 2016-03-04 MED ORDER — DEXTROSE 5 % IV SOLN
500.0000 mg | Freq: Four times a day (QID) | INTRAVENOUS | Status: DC | PRN
  Administered 2016-03-04: 500 mg via INTRAVENOUS
  Filled 2016-03-04 (×2): qty 5

## 2016-03-04 MED ORDER — THROMBIN 20000 UNITS EX SOLR
CUTANEOUS | Status: DC | PRN
  Administered 2016-03-04: 20 mL via TOPICAL

## 2016-03-04 MED ORDER — SUGAMMADEX SODIUM 200 MG/2ML IV SOLN
INTRAVENOUS | Status: DC | PRN
  Administered 2016-03-04: 200 mg via INTRAVENOUS

## 2016-03-04 MED ORDER — VITAMIN B-12 1000 MCG PO TABS
1000.0000 ug | ORAL_TABLET | Freq: Every day | ORAL | Status: DC
  Filled 2016-03-04 (×2): qty 1

## 2016-03-04 MED ORDER — MENTHOL 3 MG MT LOZG: 1.0000 | LOZENGE | OROMUCOSAL | Status: DC | PRN

## 2016-03-04 MED ORDER — MORPHINE SULFATE (PF) 2 MG/ML IV SOLN: 1.0000 mg | INTRAVENOUS | Status: DC | PRN

## 2016-03-04 MED ORDER — PHENYLEPHRINE HCL 10 MG/ML IJ SOLN
INTRAMUSCULAR | Status: DC | PRN
  Administered 2016-03-04: 80 ug via INTRAVENOUS
  Administered 2016-03-04: 160 ug via INTRAVENOUS

## 2016-03-04 MED ORDER — LIDOCAINE 2% (20 MG/ML) 5 ML SYRINGE
INTRAMUSCULAR | Status: AC
  Filled 2016-03-04: qty 5

## 2016-03-04 MED ORDER — PHENOL 1.4 % MT LIQD: 1.0000 | OROMUCOSAL | Status: DC | PRN

## 2016-03-04 MED ORDER — MEPERIDINE HCL 25 MG/ML IJ SOLN: 6.2500 mg | INTRAMUSCULAR | Status: DC | PRN

## 2016-03-04 MED ORDER — ONDANSETRON HCL 4 MG PO TABS: 4.0000 mg | ORAL_TABLET | Freq: Three times a day (TID) | ORAL | 0 refills | Status: DC | PRN

## 2016-03-04 MED ORDER — OXYCODONE-ACETAMINOPHEN 10-325 MG PO TABS: 1.0000 | ORAL_TABLET | ORAL | 0 refills | Status: DC | PRN

## 2016-03-04 MED ORDER — HYDROMORPHONE HCL 1 MG/ML IJ SOLN
INTRAMUSCULAR | Status: AC
  Administered 2016-03-04: 0.5 mg via INTRAVENOUS
  Filled 2016-03-04: qty 2

## 2016-03-04 MED ORDER — HYDROMORPHONE HCL 1 MG/ML IJ SOLN
0.5000 mg | INTRAMUSCULAR | Status: DC | PRN
  Administered 2016-03-04 (×3): 0.5 mg via INTRAVENOUS

## 2016-03-04 MED ORDER — ROCURONIUM BROMIDE 10 MG/ML (PF) SYRINGE
PREFILLED_SYRINGE | INTRAVENOUS | Status: AC
  Filled 2016-03-04: qty 10

## 2016-03-04 MED ORDER — FENTANYL CITRATE (PF) 100 MCG/2ML IJ SOLN
INTRAMUSCULAR | Status: AC
  Filled 2016-03-04: qty 4

## 2016-03-04 MED ORDER — PHENYLEPHRINE HCL 10 MG/ML IJ SOLN
INTRAVENOUS | Status: DC | PRN
  Administered 2016-03-04: 20 ug/min via INTRAVENOUS

## 2016-03-04 MED ORDER — MIDAZOLAM HCL 2 MG/2ML IJ SOLN: 0.5000 mg | Freq: Once | INTRAMUSCULAR | Status: DC | PRN

## 2016-03-04 MED ORDER — PROPOFOL 10 MG/ML IV BOLUS
INTRAVENOUS | Status: AC
  Filled 2016-03-04: qty 20

## 2016-03-04 MED ORDER — PROMETHAZINE HCL 25 MG/ML IJ SOLN
6.2500 mg | INTRAMUSCULAR | Status: DC | PRN
  Administered 2016-03-04: 6.25 mg via INTRAVENOUS

## 2016-03-04 MED ORDER — OXYCODONE HCL 5 MG PO TABS
ORAL_TABLET | ORAL | Status: AC
  Filled 2016-03-04: qty 2

## 2016-03-04 MED ORDER — PROPOFOL 10 MG/ML IV BOLUS
INTRAVENOUS | Status: DC | PRN
Start: 2016-03-04 — End: 2016-03-04
  Administered 2016-03-04: 130 mg via INTRAVENOUS

## 2016-03-04 MED ORDER — MIDAZOLAM HCL 2 MG/2ML IJ SOLN
INTRAMUSCULAR | Status: AC
  Filled 2016-03-04: qty 2

## 2016-03-04 MED ORDER — ONDANSETRON HCL 4 MG/2ML IJ SOLN: 4.0000 mg | INTRAMUSCULAR | Status: DC | PRN

## 2016-03-04 MED ORDER — CEFAZOLIN IN D5W 1 GM/50ML IV SOLN
1.0000 g | Freq: Three times a day (TID) | INTRAVENOUS | Status: AC
  Administered 2016-03-04 – 2016-03-05 (×2): 1 g via INTRAVENOUS
  Filled 2016-03-04 (×2): qty 50

## 2016-03-04 MED ORDER — BUPIVACAINE-EPINEPHRINE 0.25% -1:200000 IJ SOLN
INTRAMUSCULAR | Status: DC | PRN
  Administered 2016-03-04: 10 mL

## 2016-03-04 MED ORDER — CEFAZOLIN SODIUM-DEXTROSE 2-4 GM/100ML-% IV SOLN
2.0000 g | INTRAVENOUS | Status: AC
  Administered 2016-03-04: 2 g via INTRAVENOUS
  Filled 2016-03-04: qty 100

## 2016-03-04 MED ORDER — 0.9 % SODIUM CHLORIDE (POUR BTL) OPTIME
TOPICAL | Status: DC | PRN
  Administered 2016-03-04: 1000 mL

## 2016-03-04 MED ORDER — OXYCODONE HCL 5 MG PO TABS
10.0000 mg | ORAL_TABLET | ORAL | Status: DC | PRN
  Administered 2016-03-04 – 2016-03-05 (×4): 10 mg via ORAL
  Filled 2016-03-04 (×4): qty 2

## 2016-03-04 MED ORDER — AMLODIPINE BESYLATE 10 MG PO TABS
10.0000 mg | ORAL_TABLET | Freq: Every day | ORAL | Status: DC
  Filled 2016-03-04: qty 1

## 2016-03-04 MED ORDER — SUGAMMADEX SODIUM 200 MG/2ML IV SOLN
INTRAVENOUS | Status: AC
  Filled 2016-03-04: qty 2

## 2016-03-04 MED ORDER — LEFLUNOMIDE 10 MG PO TABS
10.0000 mg | ORAL_TABLET | Freq: Every day | ORAL | Status: DC
  Filled 2016-03-04: qty 1

## 2016-03-04 MED ORDER — METHOCARBAMOL 500 MG PO TABS: 500.0000 mg | ORAL_TABLET | Freq: Three times a day (TID) | ORAL | 0 refills | Status: DC | PRN

## 2016-03-04 MED ORDER — DEXAMETHASONE 4 MG PO TABS
4.0000 mg | ORAL_TABLET | Freq: Four times a day (QID) | ORAL | Status: AC
  Administered 2016-03-04 – 2016-03-05 (×3): 4 mg via ORAL
  Filled 2016-03-04 (×3): qty 1

## 2016-03-04 MED ORDER — PANTOPRAZOLE SODIUM 40 MG PO TBEC
40.0000 mg | DELAYED_RELEASE_TABLET | Freq: Two times a day (BID) | ORAL | Status: DC
  Administered 2016-03-04 – 2016-03-05 (×2): 40 mg via ORAL
  Filled 2016-03-04 (×2): qty 1

## 2016-03-04 MED ORDER — ACETAMINOPHEN 10 MG/ML IV SOLN
INTRAVENOUS | Status: DC | PRN
  Administered 2016-03-04: 1000 mg via INTRAVENOUS

## 2016-03-04 MED ORDER — HYDROMORPHONE HCL 1 MG/ML IJ SOLN
0.2500 mg | INTRAMUSCULAR | Status: DC | PRN
  Administered 2016-03-04 (×4): 0.5 mg via INTRAVENOUS

## 2016-03-04 MED ORDER — LACTATED RINGERS IV SOLN: INTRAVENOUS | Status: DC

## 2016-03-04 MED ORDER — PROMETHAZINE HCL 25 MG/ML IJ SOLN
INTRAMUSCULAR | Status: AC
  Filled 2016-03-04: qty 1

## 2016-03-04 MED ORDER — ONDANSETRON HCL 4 MG/2ML IJ SOLN
INTRAMUSCULAR | Status: AC
  Filled 2016-03-04: qty 2

## 2016-03-04 MED ORDER — BUPIVACAINE-EPINEPHRINE (PF) 0.25% -1:200000 IJ SOLN
INTRAMUSCULAR | Status: AC
  Filled 2016-03-04: qty 30

## 2016-03-04 MED ORDER — DEXAMETHASONE SODIUM PHOSPHATE 4 MG/ML IJ SOLN: 4.0000 mg | Freq: Four times a day (QID) | INTRAMUSCULAR | Status: AC

## 2016-03-04 MED ORDER — THROMBIN 20000 UNITS EX SOLR
CUTANEOUS | Status: AC
  Filled 2016-03-04: qty 20000

## 2016-03-04 MED ORDER — SODIUM CHLORIDE 0.9% FLUSH: 3.0000 mL | INTRAVENOUS | Status: DC | PRN

## 2016-03-04 MED ORDER — LIDOCAINE HCL (CARDIAC) 20 MG/ML IV SOLN
INTRAVENOUS | Status: DC | PRN
  Administered 2016-03-04: 25 mg via INTRAVENOUS

## 2016-03-04 MED ORDER — FENTANYL CITRATE (PF) 100 MCG/2ML IJ SOLN
INTRAMUSCULAR | Status: DC | PRN
  Administered 2016-03-04 (×2): 50 ug via INTRAVENOUS
  Administered 2016-03-04: 100 ug via INTRAVENOUS

## 2016-03-04 MED ORDER — ONDANSETRON HCL 4 MG/2ML IJ SOLN
INTRAMUSCULAR | Status: DC | PRN
  Administered 2016-03-04: 4 mg via INTRAVENOUS

## 2016-03-04 MED ORDER — HEMOSTATIC AGENTS (NO CHARGE) OPTIME
TOPICAL | Status: DC | PRN
  Administered 2016-03-04: 1 via TOPICAL

## 2016-03-04 MED ORDER — METHOCARBAMOL 500 MG PO TABS
500.0000 mg | ORAL_TABLET | Freq: Four times a day (QID) | ORAL | Status: DC | PRN
  Administered 2016-03-04 – 2016-03-05 (×2): 500 mg via ORAL
  Filled 2016-03-04 (×3): qty 1

## 2016-03-04 MED ORDER — ESTRADIOL 1 MG PO TABS
1.0000 mg | ORAL_TABLET | Freq: Every day | ORAL | Status: DC
  Filled 2016-03-04 (×2): qty 1

## 2016-03-04 MED ORDER — ACETAMINOPHEN 10 MG/ML IV SOLN
INTRAVENOUS | Status: AC
  Filled 2016-03-04: qty 100

## 2016-03-04 MED ORDER — LACTATED RINGERS IV SOLN
INTRAVENOUS | Status: DC
  Administered 2016-03-04 (×2): via INTRAVENOUS

## 2016-03-04 MED ORDER — ONDANSETRON HCL 4 MG PO TABS
4.0000 mg | ORAL_TABLET | ORAL | Status: DC | PRN
  Administered 2016-03-04 – 2016-03-05 (×4): 4 mg via ORAL
  Filled 2016-03-04 (×4): qty 1

## 2016-03-04 SURGICAL SUPPLY — 53 items
BNDG GAUZE ELAST 4 BULKY (GAUZE/BANDAGES/DRESSINGS) ×2 IMPLANT
BRUSH SCRUB DISP (MISCELLANEOUS) ×1 IMPLANT
CANISTER SUCTION 2500CC (MISCELLANEOUS) ×2 IMPLANT
CLSR STERI-STRIP ANTIMIC 1/2X4 (GAUZE/BANDAGES/DRESSINGS) ×2 IMPLANT
COVER SURGICAL LIGHT HANDLE (MISCELLANEOUS) ×2 IMPLANT
DRAPE SURG 17X23 STRL (DRAPES) ×6 IMPLANT
DRAPE U-SHAPE 47X51 STRL (DRAPES) ×2 IMPLANT
DRSG AQUACEL AG ADV 3.5X 6 (GAUZE/BANDAGES/DRESSINGS) ×2 IMPLANT
DURAPREP 26ML APPLICATOR (WOUND CARE) ×2 IMPLANT
ELECT BLADE 4.0 EZ CLEAN MEGAD (MISCELLANEOUS) ×2
ELECT PENCIL ROCKER SW 15FT (MISCELLANEOUS) ×2 IMPLANT
ELECT REM PT RETURN 9FT ADLT (ELECTROSURGICAL) ×2
ELECTRODE BLDE 4.0 EZ CLN MEGD (MISCELLANEOUS) ×1 IMPLANT
ELECTRODE REM PT RTRN 9FT ADLT (ELECTROSURGICAL) ×1 IMPLANT
GLOVE BIO SURGEON STRL SZ 6.5 (GLOVE) ×2 IMPLANT
GLOVE BIOGEL PI IND STRL 6.5 (GLOVE) ×1 IMPLANT
GLOVE BIOGEL PI IND STRL 7.0 (GLOVE) IMPLANT
GLOVE BIOGEL PI IND STRL 8.5 (GLOVE) ×1 IMPLANT
GLOVE BIOGEL PI INDICATOR 6.5 (GLOVE) ×4
GLOVE BIOGEL PI INDICATOR 7.0 (GLOVE) ×1
GLOVE BIOGEL PI INDICATOR 8.5 (GLOVE) ×1
GLOVE SS BIOGEL STRL SZ 8.5 (GLOVE) ×1 IMPLANT
GLOVE SUPERSENSE BIOGEL SZ 8.5 (GLOVE) ×1
GLOVE SURG SS PI 6.5 STRL IVOR (GLOVE) ×3 IMPLANT
GLOVE SURG SS PI 7.0 STRL IVOR (GLOVE) ×1 IMPLANT
GOWN STRL REUS W/ TWL XL LVL3 (GOWN DISPOSABLE) ×2 IMPLANT
GOWN STRL REUS W/TWL 2XL LVL3 (GOWN DISPOSABLE) ×2 IMPLANT
GOWN STRL REUS W/TWL XL LVL3 (GOWN DISPOSABLE) ×8
KIT BASIN OR (CUSTOM PROCEDURE TRAY) ×2 IMPLANT
KIT ROOM TURNOVER OR (KITS) ×2 IMPLANT
NDL SPNL 18GX3.5 QUINCKE PK (NEEDLE) ×2 IMPLANT
NEEDLE 22X1 1/2 (OR ONLY) (NEEDLE) ×2 IMPLANT
NEEDLE SPNL 18GX3.5 QUINCKE PK (NEEDLE) ×4 IMPLANT
NS IRRIG 1000ML POUR BTL (IV SOLUTION) ×2 IMPLANT
PACK LAMINECTOMY ORTHO (CUSTOM PROCEDURE TRAY) ×2 IMPLANT
PACK UNIVERSAL I (CUSTOM PROCEDURE TRAY) ×2 IMPLANT
PAD ARMBOARD 7.5X6 YLW CONV (MISCELLANEOUS) ×4 IMPLANT
PATTIES SURGICAL .5 X.5 (GAUZE/BANDAGES/DRESSINGS) ×1 IMPLANT
PATTIES SURGICAL .5 X1 (DISPOSABLE) ×2 IMPLANT
SPONGE SURGIFOAM ABS GEL 100 (HEMOSTASIS) ×2 IMPLANT
STRIP CLOSURE SKIN 1/2X4 (GAUZE/BANDAGES/DRESSINGS) ×1 IMPLANT
SURGIFLO W/THROMBIN 8M KIT (HEMOSTASIS) ×2 IMPLANT
SUT BONE WAX W31G (SUTURE) ×2 IMPLANT
SUT MON AB 3-0 SH 27 (SUTURE) ×2
SUT MON AB 3-0 SH27 (SUTURE) ×1 IMPLANT
SUT VIC AB 1 CT1 27 (SUTURE) ×2
SUT VIC AB 1 CT1 27XBRD ANBCTR (SUTURE) ×1 IMPLANT
SUT VIC AB 2-0 CT1 18 (SUTURE) ×2 IMPLANT
SYR CONTROL 10ML LL (SYRINGE) ×2 IMPLANT
TOWEL OR 17X24 6PK STRL BLUE (TOWEL DISPOSABLE) ×3 IMPLANT
TOWEL OR 17X26 10 PK STRL BLUE (TOWEL DISPOSABLE) ×2 IMPLANT
WATER STERILE IRR 1000ML POUR (IV SOLUTION) ×1 IMPLANT
YANKAUER SUCT BULB TIP NO VENT (SUCTIONS) ×2 IMPLANT

## 2016-03-04 NOTE — Discharge Instructions (Signed)
Do not remove dressing Ok to shower in 5 days Call to make follow up appointment

## 2016-03-04 NOTE — Evaluation (Signed)
Physical Therapy Evaluation Patient Details Name: Helen Hardy MRN: [id number] DOB: Sep 25, 1950 Today's Date: 03/04/2016   History of Present Illness  RIGHT FAR LATERAL DISCECTOMY L5-S1   Clinical Impression  Patient demonstrates deficits in functional mobility as indicated below. Will need continued skilled PT to address deficits and maixmize function. Will see as indicated and progress as tolerated.     Follow Up Recommendations No PT follow up;Supervision for mobility/OOB    Equipment Recommendations  None recommended by PT (patient has her own walker)    Recommendations for Other Services       Precautions / Restrictions Precautions Precautions: Back Precaution Booklet Issued: Yes (comment) Precaution Comments: verbally reviewed with patient Required Braces or Orthoses: Spinal Brace Spinal Brace: Lumbar corset Restrictions Weight Bearing Restrictions: No      Mobility  Bed Mobility Overal bed mobility: Needs Assistance Bed Mobility: Rolling;Sit to Sidelying Rolling: Min assist       Sit to sidelying: Min assist General bed mobility comments: increased assist to perform, educated on technique, multiple cues required  Transfers Overall transfer level: Needs assistance Equipment used: Rolling walker (2 wheeled) Transfers: Sit to/from Stand Sit to Stand: Min guard         General transfer comment: VCs for hand placement and safety  Ambulation/Gait Ambulation/Gait assistance: Supervision Ambulation Distance (Feet): 280 Feet Assistive device: Rolling walker (2 wheeled) Gait Pattern/deviations: Step-through pattern;Decreased stride length;Drifts right/left;Trunk flexed;Wide base of support Gait velocity: decreased Gait velocity interpretation: Below normal speed for age/gender General Gait Details: steady with ambulation using rw, ambulated 10 ft without device but was unsteady and non-functional  Stairs            Wheelchair Mobility     Modified Rankin (Stroke Patients Only)       Balance Overall balance assessment: History of Falls                                           Pertinent Vitals/Pain Pain Assessment: 0-10 Pain Score: 5  Pain Location: low back Pain Descriptors / Indicators: Sore Pain Intervention(s): Monitored during session    Home Living Family/patient expects to be discharged to:: Private residence Living Arrangements: Spouse/significant other Available Help at Discharge: Family Type of Home: House Home Access: Stairs to enter   Technical brewer of Steps: 1 Home Layout: One level Home Equipment: Bedside commode;Tub bench      Prior Function Level of Independence: Independent               Hand Dominance   Dominant Hand: Right    Extremity/Trunk Assessment   Upper Extremity Assessment: Overall WFL for tasks assessed           Lower Extremity Assessment: Generalized weakness (+ arthritic changes)      Cervical / Trunk Assessment:  (increased body habitus)  Communication   Communication: No difficulties  Cognition Arousal/Alertness: Awake/alert Behavior During Therapy: WFL for tasks assessed/performed Overall Cognitive Status: No family/caregiver present to determine baseline cognitive functioning                      General Comments      Exercises        Assessment/Plan    PT Assessment Patient needs continued PT services  PT Diagnosis Difficulty walking;Abnormality of gait;Acute pain   PT Problem List Decreased strength;Decreased activity tolerance;Decreased balance;Decreased mobility;Decreased coordination;Decreased knowledge  of use of DME;Decreased safety awareness;Obesity;Pain  PT Treatment Interventions DME instruction;Gait training;Functional mobility training;Therapeutic activities;Therapeutic exercise;Balance training;Patient/family education   PT Goals (Current goals can be found in the Care Plan section) Acute  Rehab PT Goals Patient Stated Goal: to go home PT Goal Formulation: With patient Time For Goal Achievement: 03/18/16 Potential to Achieve Goals: Good    Frequency Min 5X/week   Barriers to discharge        Co-evaluation               End of Session Equipment Utilized During Treatment: Gait belt Activity Tolerance: Patient tolerated treatment well Patient left: in bed;with call bell/phone within reach Nurse Communication: Mobility status    Functional Assessment Tool Used: clinical judgement Functional Limitation: Mobility: Walking and moving around Mobility: Walking and Moving Around Current Status (N8177): 0 percent impaired, limited or restricted Mobility: Walking and Moving Around Goal Status [phone]): At least 1 percent but less than 20 percent impaired, limited or restricted    Time: 1733-1754 PT Time Calculation (min) (ACUTE ONLY): 21 min   Charges:   PT Evaluation $PT Eval Moderate Complexity: 1 Procedure     PT G Codes:   PT G-Codes **NOT FOR INPATIENT CLASS** Functional Assessment Tool Used: clinical judgement Functional Limitation: Mobility: Walking and moving around Mobility: Walking and Moving Around Current Status (X0383): 0 percent impaired, limited or restricted Mobility: Walking and Moving Around Goal Status (F3832): At least 1 percent but less than 20 percent impaired, limited or restricted    Duncan Dull 03/04/2016, 6:02 PM Alben Deeds, [place] DPT  [phone]

## 2016-03-04 NOTE — Transfer of Care (Signed)
Immediate Anesthesia Transfer of Care Note  Patient: Helen Hardy  Procedure(s) Performed: Procedure(s): RIGHT FAR LATERAL DISCECTOMY L5-S1 (N/A)  Patient Location: PACU  Anesthesia Type:General  Level of Consciousness: awake, alert  and oriented  Airway & Oxygen Therapy: Patient Spontanous Breathing and Patient connected to nasal cannula oxygen  Post-op Assessment: Report given to RN and Patient moving all extremities X 4  Post vital signs: Reviewed and stable  Last Vitals:  Vitals:   03/04/16 0856 03/04/16 0905  BP: (!) 193/100 (!) 157/87  Pulse: 85   Resp: 20   Temp: 36.7 C     Last Pain:  Vitals:   03/04/16 0856  TempSrc: Oral      Patients Stated Pain Goal: 3 (90/24/09 7353)  Complications: No apparent anesthesia complications

## 2016-03-04 NOTE — Brief Op Note (Signed)
03/04/2016  12:41 PM  PATIENT:  Helen Hardy  65 y.o. female  PRE-OPERATIVE DIAGNOSIS:  FAR LATERAL L5-S1 RIGHT DISCARNATION  POST-OPERATIVE DIAGNOSIS:  FAR LATERAL L5-S1 RIGHT DISCARNATION  PROCEDURE:  Procedure(s): RIGHT FAR LATERAL DISCECTOMY L5-S1 (N/A)  SURGEON:  Surgeon(s) and Role:    * Melina Schools, MD - Primary  PHYSICIAN ASSISTANT:   ASSISTANTS: Carmen Mayo   ANESTHESIA:   general  EBL:  Total I/O In: 500 [I.V.:500] Out: -   BLOOD ADMINISTERED:none  DRAINS: none   LOCAL MEDICATIONS USED:  MARCAINE     SPECIMEN:  No Specimen  DISPOSITION OF SPECIMEN:  N/A  COUNTS:  YES  TOURNIQUET:  * No tourniquets in log *  DICTATION: .Other Dictation: Dictation Number [id number]  PLAN OF CARE: Admit for overnight observation  PATIENT DISPOSITION:  PACU - hemodynamically stable.

## 2016-03-04 NOTE — Op Note (Signed)
NAME:  Helen Hardy, Helen Hardy           ACCOUNT NO.:  [id number]  MEDICAL RECORD NO.:  [id number]  LOCATION:  [facility]                         FACILITY:  [facility]  PHYSICIAN:  Gio Janoski D. Rolena Infante, M.D. DATE OF BIRTH:  10/18/1950  DATE OF PROCEDURE:  03/04/2016 DATE OF DISCHARGE:                              OPERATIVE REPORT   PREOPERATIVE DIAGNOSIS:  Far lateral right L5-S1 disk herniation.  POSTOPERATIVE DIAGNOSIS:  Far lateral right L5-S1 disk herniation.  OPERATIVE PROCEDURE:  Wiltse far lateral approach for far lateral foraminotomy and diskectomy.  COMPLICATIONS:  None.  CONDITION:  Stable.  SURGEON:  Genelle Economou D. Rolena Infante, M.D.  FIRST ASSISTANT:  Ronette Deter, [place].  HISTORY:  This is a very pleasant, 65 year old woman who presents with progressive debilitating right neuropathic leg pain in the L5 distribution.  Imaging study demonstrates a far lateral L5-S1 foraminal extraforaminal disk herniation.  Attempts at conservative management had failed to alleviate her symptoms, and so we elected to proceed with surgery.  All appropriate risks, benefits, and alternatives to surgery were discussed with the patient and consent was obtained.  OPERATIVE NOTE:  The patient was brought to the operating room and placed supine on the operating table.  After successful induction of general anesthesia and endotracheal intubation, TEDs and SCDs were applied.  She was turned prone onto the [place] frame.  All bony prominences were well padded, and the back was prepped and draped in a standard fashion.  Time-out was taken confirming the patient, procedure, and all other pertinent important data.  Fluoro was used to identify the L5-S1 disk space level in the AP and lateral planes.  I marked out the incision site approximately 1-1/2 fingerbreadths lateral to the midline. An incision was made.  Sharp dissection was carried out down to the deep fascia.  The deep fascia was sharply incised and I introduced  my dilating portals.  Using lateral fluoroscopy, I confirmed that I was at the lateral aspect of the L5-S1 facet complex just lateral to the L5 as well as the L5 pars.  I confirmed this in the lateral plane as well. After I concentrically dilated up, I placed my 80-mm blade to a Thompson self-retaining retractor.  I then placed my retractor and then gently expanded the retractor.  At this point, I could visualize the L5-S1 facet complex.  I gently used bipolar electrocautery to dissect the remaining muscle fibers to expose the L5 pars.  With the L5 pars identified, I then began dissecting just on the lateral border of this. I took an x-ray with a Penfield 4 marking the area to confirm that I was at the lateral aspect of the L5 pars just at the level of the L5-S1 disk.  This was correlated with the preoperative MRI.  Once I confirmed that I was at the appropriate level, I used a 2 and 3 mm Kerrison punch to remove just a portion of the lateral aspect of the pars.  I then used my Penfield 4 to dissect into the deep layer until I could visualize the L5 exiting nerve root.  Once I had this identified, I was able to protect it and then dissect to the disk space itself.  I then visualized  the disk herniation.  I then for a final x-ray took a picture again in the AP and lateral planes at this time with the Lisle 4 actually in the disk fragment.  Once this was confirmed, I then used a micropituitary rongeur to remove several large fragments of disk material.  This was consistent with what was seen on the preoperative imaging study.  I then used my nerve hook to sweep circumferentially and removed some more fragments of disk material.  At this point, I could now easily mobilize the L5 nerve root as it was no longer under tension. I was also able to place my [facility] along the undersurface of the nerve roots and pass it into the foramen and into the lateral recess.  At this point, I had  an adequate foraminotomy and discectomy and restored the adequate space for the nerve.  The nerve itself was free of any tension.  I swept one last time to ensure there was no free fragments of disk material.  Once I had done this, I irrigated the wound copiously with normal saline.  I used bipolar electrocautery and FloSeal to obtain and maintain hemostasis.  I then irrigated again.  At this point, I removed the retractors and closed the deep fascia with a #1 barbed Stratafix.  I then closed the wound in a layered fashion with a #1 Vicryl suture, interrupted 2-0 Vicryl suture, interrupted 3-0 Monocryl, Steri-Strips and dry dressing.  The patient was ultimately extubated, transferred to the PACU without incident.  At the end of the case, all needle and sponge counts were correct.  There were no adverse intraoperative events.     Lucine Bilski D. Rolena Infante, M.D.     DDB/MEDQ  D:  03/04/2016  T:  03/04/2016  Job:  [id number]  cc:   Dr. Rolena Infante' office

## 2016-03-04 NOTE — Anesthesia Procedure Notes (Signed)
Procedure Name: Intubation Date/Time: 03/04/2016 10:40 AM Performed by: Sampson Si E Pre-anesthesia Checklist: Patient identified, Emergency Drugs available, Suction available, Patient being monitored and Timeout performed Patient Re-evaluated:Patient Re-evaluated prior to inductionOxygen Delivery Method: Circle system utilized Preoxygenation: Pre-oxygenation with 100% oxygen Intubation Type: IV induction Ventilation: Mask ventilation without difficulty Laryngoscope Size: Mac and 3 Grade View: Grade II Tube type: Oral Tube size: 7.0 mm Number of attempts: 1 Airway Equipment and Method: Stylet Placement Confirmation: ETT inserted through vocal cords under direct vision,  positive ETCO2 and breath sounds checked- equal and bilateral Secured at: 21 cm Tube secured with: Tape Dental Injury: Teeth and Oropharynx as per pre-operative assessment

## 2016-03-04 NOTE — H&P (Signed)
History of Present Illness  The patient is a 65 year old female who comes in today for a preoperative History and Physical. The patient is scheduled for a right lateral discectomy L5-S1 to be performed by Dr. Duane Lope D. Rolena Infante, MD at [facility] on 03-04-16 . Please see the hospital record for complete dictated history and physical.   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)  Degenerative lumbar disc (M51.36)  Pain of right lower extremity (M79.604)  Primary localized osteoarthritis of left knee (M17.12)  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 11/29/2013  Medication History  Norco (10-325MG Tablet, 1 (one) Oral every 6 hours as needed for pain, Taken starting 02/20/2016) Active. AmLODIPine Besylate (10MG  Tablet, Oral) Active. (qd) Ranitidine Acid Reducer (75MG Tablet, Oral) Active. (qd) Prednisone Active. (39m qd) Prednisone 576mActive. Abatacept  Active. Cholecalciferol  Active. Cyanocobalamin 100049mActive. Valium 5mg29mtive. Estradiol 1mg 64mive. Folic Acid 1mg A63mve. Leflunomide 10mg A82me. Multivitamin  Active. Pantoprazole Sodium 40mg Ac66m. (bid) Medications Reconciled  Past Surgical History  Breast Biopsy  left Total Knee Replacement - Left [04/08/2015]: Cataract Surgery  bilateral Foot Surgery  bilateral Hysterectomy  complete (non-cancerous) Total Knee Replacement  bilateral Tubal Ligation   Other Problems Cancer  Gastroesophageal Reflux Disease  High blood pressure  Rheumatoid Arthritis     Physical Exam  General General Appearance-Not in acute distress. Orientation-Oriented X3. Build & Nutrition-Well nourished and Well developed.  Integumentary General Characteristics Surgical Scars - surgical scarring consistent with previous right knee surgery, surgical scarring consistent with previous left knee surgery, no surgical scar evidence of previous lumbar surgery. Lumbar Spine-Skin examination of the lumbar spine is without deformity, skin lesions, lacerations or abrasions.  Chest and Lung Exam Auscultation Breath sounds - Normal and Clear.  Cardiovascular Auscultation Rhythm - Regular rate and rhythm.  Abdomen Palpation/Percussion Palpation and Percussion of the abdomen reveal - Soft, Non Tender and No Rebound tenderness.  Peripheral Vascular Lower Extremity Palpation - Posterior tibial pulse - Bilateral - 2+. Dorsalis pedis pulse - Bilateral - 2+.  Neurologic Sensation Lower Extremity - Left - sensation is intact in the lower extremity. Right - sensation is diminished in the lower extremity. Reflexes Patellar Reflex - Bilateral - 2+. Achilles Reflex - Bilateral - 2+. Clonus - Bilateral - clonus not present.  Hoffman's Sign - Bilateral - Hoffman's sign not present. Testing Seated Straight Leg Raise - Right - Seated straight leg raise positive.  Musculoskeletal Spine/Ribs/Pelvis  Lumbosacral Spine: Inspection and Palpation - Tenderness - right lumbar paraspinals tender to palpation. Strength and Tone: Strength - Hip Flexion - Bilateral - 5/5. Knee Extension - Bilateral - 5/5. Knee Flexion - Bilateral - 5/5. Ankle Dorsiflexion - Left - 5/5. Right - 4/5. Ankle Plantarflexion - Bilateral - 5/5. Heel walk - Bilateral - unable to heel walk. Toe Walk - Bilateral - unable to walk on toes. ROM - Flexion - moderately decreased range of motion. Extension - moderately decreased range of motion. Left Lateral Bending - moderately decreased range of motion. Right Lateral Bending - moderately decreased range of motion. Right Rotation - moderately decreased range of motion. Left Rotation - moderately decreased range of motion. Pain - . Lumbosacral Spine - Waddell's Signs - no Waddell's signs present. Lower Extremity Range of Motion - No true hip, knee or ankle pain with range of motion. Gait and Devola - walker.  MRI and x-rays were reviewed. 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 MRI was dated 01/13/2016, shows extensive severe facet arthrosis at L5-S1, but she has got a right extraforaminal disc extrusion, which displaces the right L5 nerve root in the foramen itself.  Lumbosacral HNP  Current Plans Goal Of Surgery: Discussed that goal of surgery is to reduce pain and improve function and quality of life. Patient is aware that despite all appropriate treatment that there pain and function could be the same, worse, or different. Posterior Lumbar Decompression/disectomy: Risks of surgery include infection, bleeding, nerve damage, death, stroke, paralysis, failure to heal, need for further surgery, ongoing or worse  pain, need for further surgery, CSF leak, loss of bowel or bladder, and recurrent disc herniation or Stenosis which would necessitate need for further surgery.  Plans Transcription At this point in time, the patient has motor and sensory deficits and radicular pain in the L5 distribution. She has had an L5 selective nerve root block, which has not provided any significant relief. Her MRI confirms displacement of the right L5 nerve root secondary to the foraminal disc extrusion. At this point in time, it is clear that she has L5 radiculopathy that has failed conservative management. Given the motor deficits, I think it is reasonable to proceed with surgery. This would be an extraforaminal discectomy and decompression. I have reviewed the risks with her and her husband, which include infection, bleeding, nerve damage, death, stroke, paralysis, failure to heal, need for further surgery, ongoing or worse pain, loss in bowel and bladder control, recurrent disc herniation, and need for fusion surgery. All of their questions were addressed. We will get preoperative medical clearance and as soon as we have that then we will move forward with the surgery.

## 2016-03-04 NOTE — Anesthesia Preprocedure Evaluation (Addendum)
Anesthesia Evaluation  Patient identified by MRN, date of birth, ID band Patient awake    Reviewed: Allergy & Precautions, NPO status , Patient's Chart, lab work & pertinent test results  History of Anesthesia Complications Negative for: history of anesthetic complications  Airway Mallampati: II  TM Distance: >3 FB Neck ROM: Full    Dental  (+) Partial Upper, Dental Advisory Given   Pulmonary neg pulmonary ROS,    breath sounds clear to auscultation       Cardiovascular hypertension, Pt. on medications (-) angina Rhythm:Regular Rate:Normal     Neuro/Psych Chronic back pain: narcotics    GI/Hepatic Neg liver ROS, GERD  Medicated and Controlled,  Endo/Other  Morbid obesity  Renal/GU negative Renal ROS     Musculoskeletal  (+) Arthritis , Rheumatoid disorders and steroids,    Abdominal (+) + obese,   Peds  Hematology Hb 11.7   Anesthesia Other Findings   Reproductive/Obstetrics                            Anesthesia Physical Anesthesia Plan  ASA: III  Anesthesia Plan: General   Post-op Pain Management:    Induction:   Airway Management Planned: Oral ETT  Additional Equipment:   Intra-op Plan:   Post-operative Plan: Extubation in OR  Informed Consent: I have reviewed the patients History and Physical, chart, labs and discussed the procedure including the risks, benefits and alternatives for the proposed anesthesia with the patient or authorized representative who has indicated his/her understanding and acceptance.   Dental advisory given  Plan Discussed with: Surgeon and CRNA  Anesthesia Plan Comments: (Plan routine monitors, GETA)        Anesthesia Quick Evaluation

## 2016-03-05 ENCOUNTER — Encounter (HOSPITAL_COMMUNITY): Payer: Self-pay | Admitting: Orthopedic Surgery

## 2016-03-05 DIAGNOSIS — I10 Essential (primary) hypertension: Secondary | ICD-10-CM | POA: Diagnosis not present

## 2016-03-05 DIAGNOSIS — M069 Rheumatoid arthritis, unspecified: Secondary | ICD-10-CM | POA: Diagnosis not present

## 2016-03-05 DIAGNOSIS — K219 Gastro-esophageal reflux disease without esophagitis: Secondary | ICD-10-CM | POA: Diagnosis not present

## 2016-03-05 DIAGNOSIS — M5117 Intervertebral disc disorders with radiculopathy, lumbosacral region: Secondary | ICD-10-CM | POA: Diagnosis not present

## 2016-03-05 DIAGNOSIS — Z6841 Body Mass Index (BMI) 40.0 and over, adult: Secondary | ICD-10-CM | POA: Diagnosis not present

## 2016-03-05 DIAGNOSIS — M4317 Spondylolisthesis, lumbosacral region: Secondary | ICD-10-CM | POA: Diagnosis not present

## 2016-03-05 NOTE — Anesthesia Postprocedure Evaluation (Signed)
Anesthesia Post Note  Patient: Korbin A Porter-Jones  Procedure(s) Performed: Procedure(s) (LRB): RIGHT FAR LATERAL DISCECTOMY L5-S1 (N/A)  Patient location during evaluation: PACU Anesthesia Type: General Level of consciousness: sedated, patient cooperative and oriented Pain management: pain level controlled (pain improving) Vital Signs Assessment: post-procedure vital signs reviewed and stable Respiratory status: spontaneous breathing, nonlabored ventilation, respiratory function stable and patient connected to nasal cannula oxygen Cardiovascular status: blood pressure returned to baseline and stable Postop Assessment: no signs of nausea or vomiting Anesthetic complications: no Comments: Delayed entry: pt eval in PACU post op 8/30     Last Vitals:  Vitals:   03/05/16 0403 03/05/16 0807  BP: (!) 122/52 139/83  Pulse: 84 80  Resp: 18 16  Temp: 36.7 C 36.7 C    Last Pain:  Vitals:   03/05/16 0640  TempSrc:   PainSc: 3    Pain Goal: Patients Stated Pain Goal: 3 (03/05/16 0540)               Seleta Rhymes. Dovber Ernest

## 2016-03-05 NOTE — Progress Notes (Signed)
Pt. discharged home accompanied by husband. Prescriptions and discharge instructions given with verbalization of understanding. Incision site on back with no s/s of infection - no swelling, redness, bleeding, and/or drainage noted. Pain medication given prior to discharged. Opportunity given to ask questions but no question asked. Pt. transported out of this unit in wheelchair by the volunteer

## 2016-03-05 NOTE — Evaluation (Signed)
Occupational Therapy Evaluation/Discharge Patient Details Name: Helen Hardy MRN: [id number] DOB: 31-Jan-1951 Today's Date: 03/05/2016    History of Present Illness L5-S1 discectomy. PMHx: bil TKA, left rotator cuff tear   Clinical Impression   PTA, pt was independent with ADLs and mobility. Pt currently requires min assist for seated LB ADLs and supervision for basic transfers. Educated pt on back precautions, brace wear protocol, compensatory strategies for ADLs, availability of DME/AE, pain and edema management, home safety, and fall prevention strategies. All education has been completed and pt has no further questions. Pt with no further acute OT needs. OT signing off.    Follow Up Recommendations  No OT follow up;Supervision - Intermittent    Equipment Recommendations  None recommended by OT    Recommendations for Other Services       Precautions / Restrictions Precautions Precautions: Back Precaution Booklet Issued: Yes (comment) Precaution Comments: Pt able to recall 3/3 precautions at end of session Required Braces or Orthoses: Spinal Brace Spinal Brace: Lumbar corset;Applied in sitting position Restrictions Weight Bearing Restrictions: No      Mobility Bed Mobility               General bed mobility comments: Pt up in chair on OT arrival. Pt verbalized understanding of lof roll technique  Transfers Overall transfer level: Needs assistance Equipment used: Rolling walker (2 wheeled) Transfers: Sit to/from Stand Sit to Stand: Supervision         General transfer comment: Supervision for safety. VCs for proper use of all DME    Balance Overall balance assessment: Needs assistance Sitting-balance support: No upper extremity supported;Feet supported Sitting balance-Leahy Scale: Good     Standing balance support: No upper extremity supported;During functional activity Standing balance-Leahy Scale: Fair                               ADL Overall ADL's : Needs assistance/impaired     Grooming: Wash/dry hands;Wash/dry face;Supervision/safety;Standing Grooming Details (indicate cue type and reason): educated on 2 cup method for oral care Upper Body Bathing: Modified independent;Sitting   Lower Body Bathing: Minimal assistance;Sit to/from stand Lower Body Bathing Details (indicate cue type and reason): educated on use of long-handled sponge Upper Body Dressing : Minimal assistance;Sitting Upper Body Dressing Details (indicate cue type and reason): assist for buttons/snaps due to RA in hands Lower Body Dressing: Minimal assistance;Sit to/from stand Lower Body Dressing Details (indicate cue type and reason): educated on use of reacher Toilet Transfer: Supervision/safety;Cueing for safety;Ambulation;BSC;RW Toilet Transfer Details (indicate cue type and reason): educated on use of wet wipes, toilet aid and turning whole body to reach handle and flush Toileting- Clothing Manipulation and Hygiene: Supervision/safety;Sit to/from stand       Functional mobility during ADLs: Supervision/safety;Rolling walker General ADL Comments: Educated on back precautions, brace wear protocol, compensatory strategies for ADLs, gradual activity progression, home safety and fall prevention strategies. Husband present for OT evaluation.     Vision Vision Assessment?: No apparent visual deficits   Perception     Praxis      Pertinent Vitals/Pain Pain Assessment: 0-10 Pain Score: 5  Pain Location: back' Pain Descriptors / Indicators: Sore Pain Intervention(s): Limited activity within patient's tolerance;Monitored during session;Repositioned;Premedicated before session     Hand Dominance Right   Extremity/Trunk Assessment Upper Extremity Assessment Upper Extremity Assessment: Overall WFL for tasks assessed   Lower Extremity Assessment Lower Extremity Assessment: Defer to PT evaluation  Cervical / Trunk Assessment Cervical /  Trunk Assessment: Other exceptions Cervical / Trunk Exceptions: s/p lumbar surgery   Communication Communication Communication: No difficulties   Cognition Arousal/Alertness: Awake/alert Behavior During Therapy: WFL for tasks assessed/performed Overall Cognitive Status: Within Functional Limits for tasks assessed                     General Comments       Exercises       Shoulder Instructions      Home Living Family/patient expects to be discharged to:: Private residence Living Arrangements: Spouse/significant other Available Help at Discharge: Family;Available PRN/intermittently ([employer] works 5-10 PM M-F) Type of Home: House Home Access: Stairs to enter [employer] of Steps: 1 Entrance Stairs-Rails: None Home Layout: One level     Bathroom Shower/Tub: Tub/shower unit;Curtain Shower/tub characteristics: Architectural technologist: Handicapped height     Home Equipment: Environmental consultant - 2 wheels;Bedside commode;Hand held shower head;Adaptive equipment;Tub bench Adaptive Equipment: Reacher        Prior Functioning/Environment Level of Independence: Independent             OT Diagnosis: Acute pain   OT Problem List: Decreased strength;Decreased range of motion;Decreased activity tolerance;Impaired balance (sitting and/or standing);Decreased safety awareness;Decreased knowledge of use of DME or AE;Decreased knowledge of precautions;Pain;Obesity   OT Treatment/Interventions:      OT Goals(Current goals can be found in the care plan section) Acute Rehab OT Goals Patient Stated Goal: to go home OT Goal Formulation: With patient Time For Goal Achievement: 03/19/16 Potential to Achieve Goals: Good  OT Frequency:     Barriers to D/C:            Co-evaluation              End of Session Equipment Utilized During Treatment: Gait belt;Rolling walker;Back brace Nurse Communication: Mobility status  Activity Tolerance: Patient tolerated treatment  well Patient left: in chair;with call bell/phone within reach;with family/visitor present   Time: 0823-0907 OT Time Calculation (min): 44 min Charges:  OT General Charges $OT Visit: 1 Procedure OT Evaluation $OT Eval Moderate Complexity: 1 Procedure OT Treatments $Self Care/Home Management : 23-37 mins G-Codes: OT G-codes **NOT FOR INPATIENT CLASS** Functional Assessment Tool Used: clinical judgement Functional Limitation: Self care Self Care Current Status (W2993): At least 1 percent but less than 20 percent impaired, limited or restricted Self Care Goal Status (Z1696): At least 1 percent but less than 20 percent impaired, limited or restricted Self Care Discharge Status [phone]): At least 1 percent but less than 20 percent impaired, limited or restricted  Redmond Baseman, OTR/L Pager: [phone] 03/05/2016, 12:55 PM

## 2016-03-05 NOTE — Progress Notes (Signed)
Physical Therapy Treatment Patient Details Name: Helen Hardy MRN: [id number] DOB: 18-Feb-1951 Today's Date: 03/20/16    History of Present Illness L5-S1 discectomy. PMHx: bil TKA, left rotator cuff tear    PT Comments    Pt very pleasant and stating she is very particular and has a hard time not doing housework. Pt educated for precautions, mobility and RW use with need to have assist from spouse for laundry and cleaning currently. Pt verbalized understanding. Pt and spouse educated for corset wear and donning.   Follow Up Recommendations  No PT follow up;Supervision for mobility/OOB     Equipment Recommendations       Recommendations for Other Services       Precautions / Restrictions Precautions Precautions: Back Precaution Comments: verbally reviewed with patient and with handout Spinal Brace: Lumbar corset;Applied in sitting position    Mobility  Bed Mobility Overal bed mobility: Needs Assistance Bed Mobility: Rolling;Sidelying to Sit;Sit to Sidelying Rolling: Supervision Sidelying to sit: Supervision     Sit to sidelying: Supervision General bed mobility comments: cues for sequence to maintain precautions  Transfers Overall transfer level: Needs assistance   Transfers: Sit to/from Stand Sit to Stand: Supervision         General transfer comment: VCs for hand placement and safety  Ambulation/Gait Ambulation/Gait assistance: Supervision Ambulation Distance (Feet): 400 Feet Assistive device: Rolling walker (2 wheeled) Gait Pattern/deviations: Step-through pattern;Decreased stride length   Gait velocity interpretation: Below normal speed for age/gender General Gait Details: cues for posture and position in RW   Stairs            Wheelchair Mobility    Modified Rankin (Stroke Patients Only)       Balance                                    Cognition Arousal/Alertness: Awake/alert Behavior During Therapy: WFL for  tasks assessed/performed Overall Cognitive Status: Within Functional Limits for tasks assessed                      Exercises      General Comments        Pertinent Vitals/Pain Pain Score: 3  Pain Location: right thigh Pain Descriptors / Indicators: Sore Pain Intervention(s): Limited activity within patient's tolerance;Monitored during session;Repositioned    Home Living                      Prior Function            PT Goals (current goals can now be found in the care plan section) Progress towards PT goals: Progressing toward goals    Frequency       PT Plan Current plan remains appropriate    Co-evaluation             End of Session Equipment Utilized During Treatment: Back brace Activity Tolerance: Patient tolerated treatment well Patient left: in bed;with call bell/phone within reach;with family/visitor present     Time: 0732-0800 PT Time Calculation (min) (ACUTE ONLY): 28 min  Charges:  $Gait Training: 8-22 mins $Therapeutic Activity: 8-22 mins                    G Codes:      Melford Aase 03/20/16, 8:56 AM Elwyn Reach, [place]

## 2016-03-05 NOTE — Progress Notes (Signed)
    Subjective: Procedure(s) (LRB): RIGHT FAR LATERAL DISCECTOMY L5-S1 (N/A) 1 Day Post-Op  Patient reports pain as 1 on 0-10 scale.  Reports none leg pain reports incisional back pain   Positive void Negative bowel movement Positive flatus Negative chest pain or shortness of breath  Objective: Vital signs in last 24 hours: Temp:  [97.8 F (36.6 C)-98.1 F (36.7 C)] 98 F (36.7 C) (08/31 0807) Pulse Rate:  [68-89] 80 (08/31 0807) Resp:  [10-20] 16 (08/31 0807) BP: (96-193)/(46-104) 139/83 (08/31 0807) SpO2:  [94 %-100 %] 100 % (08/31 0807) Weight:  [102.1 kg (225 lb)] 102.1 kg (225 lb) (08/30 0856)  Intake/Output from previous day: 08/30 0701 - 08/31 0700 In: 2703 [P.O.:720; I.V.:1000] Out: 50 [Blood:50]  Labs: No results for input(s): WBC, RBC, HCT, PLT in the last 72 hours. No results for input(s): NA, K, CL, CO2, BUN, CREATININE, GLUCOSE, CALCIUM in the last 72 hours. No results for input(s): LABPT, INR in the last 72 hours.  Physical Exam: Neurologically intact Intact pulses distally Incision: dressing C/D/I Compartment soft  Assessment/Plan: Patient stable  xrays n/a  Continue mobilization with physical therapy Continue care  Advance diet Up with therapy  Doing well - plan on d/c to home today  Melina Schools, MD [facility] [phone]

## 2016-03-11 NOTE — Discharge Summary (Signed)
Patient ID: Helen Hardy MRN: [id number] DOB/AGE: [birth year] 65 y.o.  Admit date: 03/04/2016 Discharge date: 03/11/2016  Admission Diagnoses:  Active Problems:   Lumbar disc herniation   Discharge Diagnoses:  Active Problems:   Lumbar disc herniation  status post Procedure(s): RIGHT FAR LATERAL DISCECTOMY L5-S1  Past Medical History:  Diagnosis Date  . Anemia   . Arthritis    Rheumatoid  . Cancer ([place])    Non-Hodgkins Lymphoma  . GERD (gastroesophageal reflux disease)   . Hypertension   . 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 Numbness and tingling of right leg   . Pneumonia     Surgeries: Procedure(s): RIGHT FAR LATERAL DISCECTOMY L5-S1 on 03/04/2016   Consultants:   Discharged Condition: Improved  Hospital Course: Helen Hardy is an 65 y.o. female who was admitted 03/04/2016 for operative treatment of DDD Lumbar. Patient failed conservative treatments (please see the history and physical for the specifics) and had severe unremitting pain that affects sleep, daily activities and work/hobbies. After pre-op clearance, the patient was taken to the operating room on 03/04/2016 and underwent  Procedure(s): RIGHT FAR LATERAL DISCECTOMY L5-S1.  Decreased leg pain.  Incisional pain controlled on oral medication.  Urinating w/o difficulty.  Pt is ambulating.  Cleared by PT for DC.   Patient was given perioperative antibiotics:  Anti-infectives    Start     Dose/Rate Route Frequency Ordered Stop   03/04/16 1830  ceFAZolin (ANCEF) IVPB 1 g/50 mL premix     1 g 100 mL/hr over 30 Minutes Intravenous Every 8 hours 03/04/16 1604 03/05/16 0218   03/04/16 0843  ceFAZolin (ANCEF) IVPB 2g/100 mL premix     2 g 200 mL/hr over 30 Minutes Intravenous 30 min pre-op 03/04/16 0843 03/04/16 1045       Patient was given sequential compression devices and early ambulation to prevent DVT.   Patient benefited maximally from hospital stay and there were no complications. At the  time of discharge, the patient was urinating/moving their bowels without difficulty, tolerating a regular diet, pain is controlled with oral pain medications and they have been cleared by PT/OT.   Recent vital signs: No data found.    Recent laboratory studies: No results for input(s): WBC, HGB, HCT, PLT, NA, K, CL, CO2, BUN, CREATININE, GLUCOSE, INR, CALCIUM in the last 72 hours.  Invalid input(s): PT, 2   Discharge Medications:     Medication List    STOP taking these medications   HYDROcodone-acetaminophen 10-325 MG tablet Commonly known as:  NORCO   multivitamin with minerals Tabs tablet     TAKE these medications   amLODipine 10 MG tablet Commonly known as:  NORVASC Take 10 mg by mouth daily.   estradiol 1 MG tablet Commonly known as:  ESTRACE Take 1 mg by mouth daily.   leflunomide 10 MG tablet Commonly known as:  ARAVA Take 10 mg by mouth daily.   methocarbamol 500 MG tablet Commonly known as:  ROBAXIN Take 1 tablet (500 mg total) by mouth 3 (three) times daily as needed for muscle spasms.   ondansetron 4 MG tablet Commonly known as:  ZOFRAN Take 1 tablet (4 mg total) by mouth every 8 (eight) hours as needed for nausea or vomiting.   oxyCODONE-acetaminophen 10-325 MG tablet Commonly known as:  PERCOCET Take 1 tablet by mouth every 4 (four) hours as needed for pain.   pantoprazole 40 MG tablet Commonly known as:  PROTONIX Take 40 mg by mouth 2 (  two) times daily.   predniSONE 5 MG tablet Commonly known as:  DELTASONE Take 5 mg by mouth daily.   triamcinolone cream 0.1 % Commonly known as:  KENALOG Apply 1 application topically as needed (for rash).   vitamin B-12 1000 MCG tablet Commonly known as:  CYANOCOBALAMIN Take 1,000 mcg by mouth daily.       Diagnostic Studies: Dg Lumbar Spine 2-3 Views  Result Date: 03/04/2016 CLINICAL DATA:  65 year old female undergoing L5-S1 discectomy. Initial encounter. EXAM: DG C-ARM 61-120 MIN; LUMBAR SPINE - 2-3  VIEW COMPARISON:  CT Abdomen and Pelvis 12/01/2007. FINDINGS: Normal lumbar segmentation demonstrated in 2009. Single AP and single lateral intraoperative fluoroscopic view of the lower lumbar spine provided today. These demonstrate a surgical probe along the right L5-S1 disc space. IMPRESSION: Intraoperative localization at L5-S1. Electronically Signed   By: Genevie Ann M.D.   On: 03/04/2016 12:23   Dg C-arm 1-60 Min  Result Date: 03/04/2016 CLINICAL DATA:  65 year old female undergoing L5-S1 discectomy. Initial encounter. EXAM: DG C-ARM 61-120 MIN; LUMBAR SPINE - 2-3 VIEW COMPARISON:  CT Abdomen and Pelvis 12/01/2007. FINDINGS: Normal lumbar segmentation demonstrated in 2009. Single AP and single lateral intraoperative fluoroscopic view of the lower lumbar spine provided today. These demonstrate a surgical probe along the right L5-S1 disc space. IMPRESSION: Intraoperative localization at L5-S1. Electronically Signed   By: Genevie Ann M.D.   On: 03/04/2016 12:23      Follow-up Information    Dahlia Bailiff, MD. Schedule an appointment as soon as possible for a visit in 2 weeks.   Specialty:  Orthopedic Surgery Why:  If symptoms worsen, For suture removal, For wound re-check Contact information: [address] [place] [address] [place] [address] [phone]           Discharge Plan:  discharge to home.  Pt will present top clinic in 2 weeks.  Post op medications provided.   Disposition:     Signed: MayoDarla Lesches for Dr. Melina Schools [facility] [phone] 03/11/2016, 1:45 PM

## 2016-03-20 DIAGNOSIS — Z4789 Encounter for other orthopedic aftercare: Secondary | ICD-10-CM | POA: Diagnosis not present

## 2016-03-25 DIAGNOSIS — Z23 Encounter for immunization: Secondary | ICD-10-CM | POA: Diagnosis not present

## 2016-03-25 DIAGNOSIS — M25571 Pain in right ankle and joints of right foot: Secondary | ICD-10-CM | POA: Diagnosis not present

## 2016-03-25 DIAGNOSIS — M7989 Other specified soft tissue disorders: Secondary | ICD-10-CM | POA: Diagnosis not present

## 2016-03-25 DIAGNOSIS — M79671 Pain in right foot: Secondary | ICD-10-CM | POA: Diagnosis not present

## 2016-03-25 DIAGNOSIS — M19071 Primary osteoarthritis, right ankle and foot: Secondary | ICD-10-CM | POA: Diagnosis not present

## 2016-03-30 DIAGNOSIS — M0579 Rheumatoid arthritis with rheumatoid factor of multiple sites without organ or systems involvement: Secondary | ICD-10-CM | POA: Diagnosis not present

## 2016-03-30 DIAGNOSIS — Z79899 Other long term (current) drug therapy: Secondary | ICD-10-CM | POA: Diagnosis not present

## 2016-03-30 DIAGNOSIS — I10 Essential (primary) hypertension: Secondary | ICD-10-CM | POA: Diagnosis not present

## 2016-04-07 DIAGNOSIS — M0589 Other rheumatoid arthritis with rheumatoid factor of multiple sites: Secondary | ICD-10-CM | POA: Diagnosis not present

## 2016-04-21 DIAGNOSIS — Z4789 Encounter for other orthopedic aftercare: Secondary | ICD-10-CM | POA: Diagnosis not present

## 2016-04-21 DIAGNOSIS — M5136 Other intervertebral disc degeneration, lumbar region: Secondary | ICD-10-CM | POA: Diagnosis not present

## 2016-04-28 DIAGNOSIS — M5136 Other intervertebral disc degeneration, lumbar region: Secondary | ICD-10-CM | POA: Diagnosis not present

## 2016-04-30 DIAGNOSIS — M5136 Other intervertebral disc degeneration, lumbar region: Secondary | ICD-10-CM | POA: Diagnosis not present

## 2016-05-04 DIAGNOSIS — M5136 Other intervertebral disc degeneration, lumbar region: Secondary | ICD-10-CM | POA: Diagnosis not present

## 2016-05-05 DIAGNOSIS — M0589 Other rheumatoid arthritis with rheumatoid factor of multiple sites: Secondary | ICD-10-CM | POA: Diagnosis not present

## 2016-05-08 DIAGNOSIS — M5136 Other intervertebral disc degeneration, lumbar region: Secondary | ICD-10-CM | POA: Diagnosis not present

## 2016-05-12 DIAGNOSIS — M5136 Other intervertebral disc degeneration, lumbar region: Secondary | ICD-10-CM | POA: Diagnosis not present

## 2016-05-15 DIAGNOSIS — M5136 Other intervertebral disc degeneration, lumbar region: Secondary | ICD-10-CM | POA: Diagnosis not present

## 2016-05-19 DIAGNOSIS — M5136 Other intervertebral disc degeneration, lumbar region: Secondary | ICD-10-CM | POA: Diagnosis not present

## 2016-05-20 DIAGNOSIS — Z130 Encounter for screening for diseases of the blood and blood-forming organs and certain disorders involving the immune mechanism: Secondary | ICD-10-CM | POA: Diagnosis not present

## 2016-05-20 DIAGNOSIS — Z1389 Encounter for screening for other disorder: Secondary | ICD-10-CM | POA: Diagnosis not present

## 2016-05-20 DIAGNOSIS — Z01419 Encounter for gynecological examination (general) (routine) without abnormal findings: Secondary | ICD-10-CM | POA: Diagnosis not present

## 2016-05-20 DIAGNOSIS — Z79890 Hormone replacement therapy (postmenopausal): Secondary | ICD-10-CM | POA: Diagnosis not present

## 2016-05-21 ENCOUNTER — Encounter: Payer: Self-pay | Admitting: Family

## 2016-05-21 ENCOUNTER — Ambulatory Visit: Payer: Medicare Other

## 2016-05-21 ENCOUNTER — Other Ambulatory Visit (HOSPITAL_BASED_OUTPATIENT_CLINIC_OR_DEPARTMENT_OTHER): Payer: Medicare Other

## 2016-05-21 ENCOUNTER — Ambulatory Visit (HOSPITAL_BASED_OUTPATIENT_CLINIC_OR_DEPARTMENT_OTHER): Payer: Medicare Other | Admitting: Family

## 2016-05-21 VITALS — BP 146/73 | HR 72 | Temp 97.4°F | Wt 226.0 lb

## 2016-05-21 DIAGNOSIS — D571 Sickle-cell disease without crisis: Secondary | ICD-10-CM

## 2016-05-21 DIAGNOSIS — K909 Intestinal malabsorption, unspecified: Secondary | ICD-10-CM

## 2016-05-21 DIAGNOSIS — Z8572 Personal history of non-Hodgkin lymphomas: Secondary | ICD-10-CM | POA: Diagnosis not present

## 2016-05-21 DIAGNOSIS — D509 Iron deficiency anemia, unspecified: Secondary | ICD-10-CM

## 2016-05-21 LAB — CBC WITH DIFFERENTIAL (CANCER CENTER ONLY)
BASO#: 0 10*3/uL (ref 0.0–0.2)
BASO%: 0 % (ref 0.0–2.0)
EOS%: 0.7 % (ref 0.0–7.0)
Eosinophils Absolute: 0 10*3/uL (ref 0.0–0.5)
HCT: 32.7 % — ABNORMAL LOW (ref 34.8–46.6)
HGB: 11 g/dL — ABNORMAL LOW (ref 11.6–15.9)
LYMPH#: 1.8 10*3/uL (ref 0.9–3.3)
LYMPH%: 31.7 % (ref 14.0–48.0)
MCH: 35.4 pg — ABNORMAL HIGH (ref 26.0–34.0)
MCHC: 33.6 g/dL (ref 32.0–36.0)
MCV: 105 fL — ABNORMAL HIGH (ref 81–101)
MONO#: 0.5 10*3/uL (ref 0.1–0.9)
MONO%: 8.1 % (ref 0.0–13.0)
NEUT#: 3.4 10*3/uL (ref 1.5–6.5)
NEUT%: 59.5 % (ref 39.6–80.0)
Platelets: 162 10*3/uL (ref 145–400)
RBC: 3.11 10*6/uL — ABNORMAL LOW (ref 3.70–5.32)
RDW: 15.5 % (ref 11.1–15.7)
WBC: 5.7 10*3/uL (ref 3.9–10.0)

## 2016-05-21 LAB — COMPREHENSIVE METABOLIC PANEL
ALT: 14 U/L (ref 0–55)
AST: 16 U/L (ref 5–34)
Albumin: 3.6 g/dL (ref 3.5–5.0)
Alkaline Phosphatase: 67 U/L (ref 40–150)
Anion Gap: 10 mEq/L (ref 3–11)
BUN: 13.4 mg/dL (ref 7.0–26.0)
CO2: 23 mEq/L (ref 22–29)
Calcium: 9.4 mg/dL (ref 8.4–10.4)
Chloride: 109 mEq/L (ref 98–109)
Creatinine: 0.8 mg/dL (ref 0.6–1.1)
EGFR: >90 mL/min/{1.73_m2} (ref >90)
Glucose: 100 mg/dl (ref 70–140)
Potassium: 4 mEq/L (ref 3.5–5.1)
Sodium: 142 mEq/L (ref 136–145)
Total Bilirubin: 0.39 mg/dL (ref 0.20–1.20)
Total Protein: 6.7 g/dL (ref 6.4–8.3)

## 2016-05-21 LAB — FERRITIN: Ferritin: 400 ng/ml — ABNORMAL HIGH (ref 9–269)

## 2016-05-21 LAB — IRON AND TIBC
%SAT: 49 % (ref 21–57)
Iron: 128 ug/dL (ref 41–142)
TIBC: 263 ug/dL (ref 236–444)
UIBC: 135 ug/dL (ref 120–384)

## 2016-05-21 NOTE — Progress Notes (Signed)
Hematology and Oncology Follow Up Visit  Helen Hardy [id number] Aug 19, 1950 65 y.o. 05/21/2016   Principle Diagnosis:  1. 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 Sickle cell trait 3. Anemia of chronic disease 4.  Iron deficiency anemia  Current Therapy:   IV Iron as indicated - last dose received in December 4650 Folic acid OTC PO daily    Interim History:  Helen Hardy is here today for a follow-up. She had surgery in late August for degenerative disc disease of the lumbar spine. She has recuperated nicely and is still in PT 3 times a week. She takes Neurontin at night which has helped with her neuropathic pain and she is able to sleep much better.  She is ambulating well using a cane and has had no falls or syncopal episodes.  She sees her surgeon later this month on November 28th for follow-up.  She is enjoying getting out and spending time with her family and had a nice time going out to dinner for her birthday last week.  Hgb is stable at 11.0 with an MCV of 105. She has not received Feraheme in almost 1 year. Her Iron saturation in August was 47% with a ferritin of 672.  No fever, chills, n/v, cough, rash, dizziness, SOB, chest pain, palpitations, abdominal pain or changes in bowel or bladder habits. She has occasional constipation and drinks prune juice as needed.  No swelling, tenderness, numbness or tingling in her extremities. No c/o joint aches or "bone" pain.  She has maintained a good appetite and is staying well hydrated. Her weight is stable.   Medications:    Medication List       Accurate as of 05/21/16  9:53 AM. Always use your most recent med list.          amLODipine 10 MG tablet Commonly known as:  NORVASC Take 10 mg by mouth daily.   estradiol 1 MG tablet Commonly known as:  ESTRACE Take 1 mg by mouth daily.   leflunomide 10 MG tablet Commonly known as:  ARAVA Take 10 mg by mouth daily.   methocarbamol 500 MG  tablet Commonly known as:  ROBAXIN Take 1 tablet (500 mg total) by mouth 3 (three) times daily as needed for muscle spasms.   ondansetron 4 MG tablet Commonly known as:  ZOFRAN Take 1 tablet (4 mg total) by mouth every 8 (eight) hours as needed for nausea or vomiting.   oxyCODONE-acetaminophen 10-325 MG tablet Commonly known as:  PERCOCET Take 1 tablet by mouth every 4 (four) hours as needed for pain.   pantoprazole 40 MG tablet Commonly known as:  PROTONIX Take 40 mg by mouth 2 (two) times daily.   predniSONE 5 MG tablet Commonly known as:  DELTASONE Take 5 mg by mouth daily.   triamcinolone cream 0.1 % Commonly known as:  KENALOG Apply 1 application topically as needed (for rash).   vitamin B-12 1000 MCG tablet Commonly known as:  CYANOCOBALAMIN Take 1,000 mcg by mouth daily.       Allergies:  Allergies  Allergen Reactions  . Oxycodone Nausea And Vomiting    Past Medical History, Surgical history, Social history, and Family History were reviewed and updated.  Review of Systems: All other 10 point review of systems is negative.   Physical Exam:  vitals were not taken for this visit.  Wt Readings from Last 3 Encounters:  03/04/16 225 lb (102.1 kg)  02/27/16 225 lb 6.4 oz (102.2 kg)  02/19/16 226 lb (102.5 kg)    Ocular: Sclerae unicteric, pupils equal, round and reactive to light Ear-nose-throat: Oropharynx clear, dentition fair Lymphatic: No cervical supraclavicular or axillary adenopathy Lungs no rales or rhonchi, good excursion bilaterally Heart regular rate and rhythm, no murmur appreciated Abd soft, nontender, positive bowel sounds, no liver or spleen tip palpated on exam MSK no focal spinal tenderness, no joint edema Neuro: non-focal, well-oriented, appropriate affect Breasts: Deferred  Lab Results  Component Value Date   WBC 5.7 05/21/2016   HGB 11.0 (L) 05/21/2016   HCT 32.7 (L) 05/21/2016   MCV 105 (H) 05/21/2016   PLT 162 05/21/2016   Lab  Results  Component Value Date   FERRITIN 672 (H) 02/19/2016   IRON 120 02/19/2016   TIBC 254 02/19/2016   UIBC 134 02/19/2016   IRONPCTSAT 47 02/19/2016   Lab Results  Component Value Date   RETICCTPCT 1.4 06/21/2015   RBC 3.11 (L) 05/21/2016   RETICCTABS 41.0 06/21/2015   No results found for: KPAFRELGTCHN, LAMBDASER, KAPLAMBRATIO No results found for: IGGSERUM, IGA, IGMSERUM No results found for: Odetta Pink, SPEI   Chemistry      Component Value Date/Time   NA 138 02/27/2016 1452   NA 143 02/19/2016 0744   K 3.9 02/27/2016 1452   K 3.7 02/19/2016 0744   CL 108 02/27/2016 1452   CL 103 06/21/2015 0800   CO2 23 02/27/2016 1452   CO2 23 02/19/2016 0744   BUN 10 02/27/2016 1452   BUN 10.4 02/19/2016 0744   CREATININE 0.83 02/27/2016 1452   CREATININE 0.8 02/19/2016 0744      Component Value Date/Time   CALCIUM 9.7 02/27/2016 1452   CALCIUM 9.8 02/19/2016 0744   ALKPHOS 65 02/19/2016 0744   AST 19 02/19/2016 0744   ALT 13 02/19/2016 0744   BILITOT 0.47 02/19/2016 0744     Impression and Plan: Helen Hardy is 65 yo African [place] female with past history of low-grade non-Hodgkin's lymphoma and completed treatment over 13 years ago. So far, she has done well and there has been no evidence of recurrence.  She also has the sickle cell trait and intermittent iron deficiency anemia.  She takes folic acid OTC PO daily and has not required Feraheme in almost a year.  We will see what her iron studies show and bring her in next week for an infusion if needed.  We will plan to see her back in 4 months for repeat lab work and follow-up. She will contact our office with any questions or concerns. We can certainly see her sooner if needed.   Eliezer Bottom, NP 11/16/20179:53 AM

## 2016-05-22 LAB — RETICULOCYTES: Reticulocyte Count: 1.8 % (ref 0.6–2.6)

## 2016-05-26 DIAGNOSIS — M5136 Other intervertebral disc degeneration, lumbar region: Secondary | ICD-10-CM | POA: Diagnosis not present

## 2016-05-26 LAB — HEMOGLOBINOPATHY EVALUATION
HGB C: 0 %
HGB S: 0 %
HGB VARIANT: 1.2 %
Hemoglobin A2 Quantitation: 1.2 % (ref 0.7–3.1)
Hemoglobin F Quantitation: 0 % (ref 0.0–2.0)
Hgb A: 97.6 % (ref 94.0–98.0)

## 2016-06-01 DIAGNOSIS — M5136 Other intervertebral disc degeneration, lumbar region: Secondary | ICD-10-CM | POA: Diagnosis not present

## 2016-06-02 ENCOUNTER — Other Ambulatory Visit: Payer: Self-pay | Admitting: Obstetrics and Gynecology

## 2016-06-02 DIAGNOSIS — Z1231 Encounter for screening mammogram for malignant neoplasm of breast: Secondary | ICD-10-CM

## 2016-06-02 DIAGNOSIS — M5136 Other intervertebral disc degeneration, lumbar region: Secondary | ICD-10-CM | POA: Diagnosis not present

## 2016-06-04 DIAGNOSIS — M0589 Other rheumatoid arthritis with rheumatoid factor of multiple sites: Secondary | ICD-10-CM | POA: Diagnosis not present

## 2016-06-04 DIAGNOSIS — M5136 Other intervertebral disc degeneration, lumbar region: Secondary | ICD-10-CM | POA: Diagnosis not present

## 2016-06-09 DIAGNOSIS — M5136 Other intervertebral disc degeneration, lumbar region: Secondary | ICD-10-CM | POA: Diagnosis not present

## 2016-06-11 DIAGNOSIS — M5136 Other intervertebral disc degeneration, lumbar region: Secondary | ICD-10-CM | POA: Diagnosis not present

## 2016-06-15 ENCOUNTER — Telehealth: Payer: Self-pay | Admitting: *Deleted

## 2016-06-15 NOTE — Telephone Encounter (Signed)
Left message on cell phone that I called [place] [facility], and asked them to call her. Patient states she see dr Marin Olp.

## 2016-06-16 DIAGNOSIS — M5136 Other intervertebral disc degeneration, lumbar region: Secondary | ICD-10-CM | POA: Diagnosis not present

## 2016-06-18 DIAGNOSIS — M5136 Other intervertebral disc degeneration, lumbar region: Secondary | ICD-10-CM | POA: Diagnosis not present

## 2016-06-23 DIAGNOSIS — M5136 Other intervertebral disc degeneration, lumbar region: Secondary | ICD-10-CM | POA: Diagnosis not present

## 2016-06-25 DIAGNOSIS — M5136 Other intervertebral disc degeneration, lumbar region: Secondary | ICD-10-CM | POA: Diagnosis not present

## 2016-06-30 DIAGNOSIS — H02839 Dermatochalasis of unspecified eye, unspecified eyelid: Secondary | ICD-10-CM | POA: Diagnosis not present

## 2016-06-30 DIAGNOSIS — H18413 Arcus senilis, bilateral: Secondary | ICD-10-CM | POA: Diagnosis not present

## 2016-06-30 DIAGNOSIS — Z961 Presence of intraocular lens: Secondary | ICD-10-CM | POA: Diagnosis not present

## 2016-06-30 DIAGNOSIS — M5136 Other intervertebral disc degeneration, lumbar region: Secondary | ICD-10-CM | POA: Diagnosis not present

## 2016-06-30 DIAGNOSIS — I10 Essential (primary) hypertension: Secondary | ICD-10-CM | POA: Diagnosis not present

## 2016-07-02 ENCOUNTER — Ambulatory Visit: Payer: Medicare Other

## 2016-07-02 DIAGNOSIS — M5136 Other intervertebral disc degeneration, lumbar region: Secondary | ICD-10-CM | POA: Diagnosis not present

## 2016-07-03 DIAGNOSIS — M0579 Rheumatoid arthritis with rheumatoid factor of multiple sites without organ or systems involvement: Secondary | ICD-10-CM | POA: Diagnosis not present

## 2016-07-03 DIAGNOSIS — R5383 Other fatigue: Secondary | ICD-10-CM | POA: Diagnosis not present

## 2016-07-07 ENCOUNTER — Ambulatory Visit
Admission: RE | Admit: 2016-07-07 | Discharge: 2016-07-07 | Disposition: A | Discharge status: Home / Self Care | Payer: Medicare Other | Source: Ambulatory Visit | Attending: Obstetrics and Gynecology | Admitting: Obstetrics and Gynecology

## 2016-07-07 DIAGNOSIS — Z1231 Encounter for screening mammogram for malignant neoplasm of breast: Secondary | ICD-10-CM

## 2016-07-15 DIAGNOSIS — N909 Noninflammatory disorder of vulva and perineum, unspecified: Secondary | ICD-10-CM | POA: Diagnosis not present

## 2016-07-15 DIAGNOSIS — B373 Candidiasis of vulva and vagina: Secondary | ICD-10-CM | POA: Diagnosis not present

## 2016-07-16 ENCOUNTER — Encounter (HOSPITAL_COMMUNITY): Payer: Self-pay | Admitting: Emergency Medicine

## 2016-07-16 DIAGNOSIS — J09X2 Influenza due to identified novel influenza A virus with other respiratory manifestations: Secondary | ICD-10-CM | POA: Insufficient documentation

## 2016-07-16 DIAGNOSIS — I10 Essential (primary) hypertension: Secondary | ICD-10-CM | POA: Insufficient documentation

## 2016-07-16 DIAGNOSIS — Z79899 Other long term (current) drug therapy: Secondary | ICD-10-CM | POA: Diagnosis not present

## 2016-07-16 DIAGNOSIS — Z96653 Presence of artificial knee joint, bilateral: Secondary | ICD-10-CM | POA: Diagnosis not present

## 2016-07-16 DIAGNOSIS — R05 Cough: Secondary | ICD-10-CM | POA: Diagnosis not present

## 2016-07-16 NOTE — ED Triage Notes (Signed)
Pt is c/o productive cough with yellow sputum, ears hurt, back pain , shaking, chills, and nausea  Pt states she went to her PCP yesterday and states he told her she had a yeast infection and gave her some medication for that  Pt states she has taken a hydrocodone and a nausea pill

## 2016-07-17 ENCOUNTER — Emergency Department (HOSPITAL_COMMUNITY)
Admission: EM | Admit: 2016-07-17 | Discharge: 2016-07-17 | Disposition: A | Discharge status: Home / Self Care | Payer: Medicare Other | Attending: Emergency Medicine | Admitting: Emergency Medicine

## 2016-07-17 ENCOUNTER — Emergency Department (HOSPITAL_COMMUNITY): Payer: Medicare Other

## 2016-07-17 DIAGNOSIS — R05 Cough: Secondary | ICD-10-CM | POA: Diagnosis not present

## 2016-07-17 DIAGNOSIS — J101 Influenza due to other identified influenza virus with other respiratory manifestations: Secondary | ICD-10-CM

## 2016-07-17 DIAGNOSIS — J09X2 Influenza due to identified novel influenza A virus with other respiratory manifestations: Secondary | ICD-10-CM | POA: Diagnosis not present

## 2016-07-17 LAB — INFLUENZA PANEL BY PCR (TYPE A & B)
Influenza A By PCR: POSITIVE — AB
Influenza B By PCR: NEGATIVE

## 2016-07-17 LAB — URINALYSIS, ROUTINE W REFLEX MICROSCOPIC
Bilirubin Urine: NEGATIVE
Glucose, UA: NEGATIVE mg/dL
Hgb urine dipstick: NEGATIVE
Ketones, ur: NEGATIVE mg/dL
Leukocytes, UA: NEGATIVE
Nitrite: NEGATIVE
Protein, ur: NEGATIVE mg/dL
Specific Gravity, Urine: 1.019 (ref 1.005–1.030)
pH: 5 (ref 5.0–8.0)

## 2016-07-17 LAB — CBC
HCT: 32.5 % — ABNORMAL LOW (ref 36.0–46.0)
Hemoglobin: 10.8 g/dL — ABNORMAL LOW (ref 12.0–15.0)
MCH: 34.4 pg — ABNORMAL HIGH (ref 26.0–34.0)
MCHC: 33.2 g/dL (ref 30.0–36.0)
MCV: 103.5 fL — ABNORMAL HIGH (ref 78.0–100.0)
Platelets: 140 10*3/uL — ABNORMAL LOW (ref 150–400)
RBC: 3.14 MIL/uL — ABNORMAL LOW (ref 3.87–5.11)
RDW: 15.1 % (ref 11.5–15.5)
WBC: 4.2 10*3/uL (ref 4.0–10.5)

## 2016-07-17 LAB — BASIC METABOLIC PANEL
Anion gap: 8 (ref 5–15)
BUN: 13 mg/dL (ref 6–20)
CO2: 28 mmol/L (ref 22–32)
Calcium: 8.9 mg/dL (ref 8.9–10.3)
Chloride: 102 mmol/L (ref 101–111)
Creatinine, Ser: 0.91 mg/dL (ref 0.44–1.00)
GFR calc Af Amer: >60 mL/min (ref >60)
GFR calc non Af Amer: >60 mL/min (ref >60)
Glucose, Bld: 88 mg/dL (ref 65–99)
Potassium: 3.7 mmol/L (ref 3.5–5.1)
Sodium: 138 mmol/L (ref 135–145)

## 2016-07-17 LAB — I-STAT CG4 LACTIC ACID, ED: Lactic Acid, Venous: 0.87 mmol/L (ref 0.5–1.9)

## 2016-07-17 MED ORDER — SODIUM CHLORIDE 0.9 % IV BOLUS (SEPSIS)
1000.0000 mL | Freq: Once | INTRAVENOUS | Status: AC
  Administered 2016-07-17: 1000 mL via INTRAVENOUS

## 2016-07-17 MED ORDER — ACETAMINOPHEN 325 MG PO TABS
650.0000 mg | ORAL_TABLET | Freq: Once | ORAL | Status: AC
  Administered 2016-07-17: 650 mg via ORAL
  Filled 2016-07-17: qty 2

## 2016-07-17 MED ORDER — OSELTAMIVIR PHOSPHATE 75 MG PO CAPS
75.0000 mg | ORAL_CAPSULE | Freq: Once | ORAL | Status: AC
  Administered 2016-07-17: 75 mg via ORAL
  Filled 2016-07-17: qty 1

## 2016-07-17 MED ORDER — OSELTAMIVIR PHOSPHATE 75 MG PO CAPS: 75.0000 mg | ORAL_CAPSULE | Freq: Two times a day (BID) | ORAL | 0 refills | Status: DC

## 2016-07-17 MED ORDER — IBUPROFEN 200 MG PO TABS
600.0000 mg | ORAL_TABLET | Freq: Once | ORAL | Status: AC
  Administered 2016-07-17: 600 mg via ORAL
  Filled 2016-07-17: qty 3

## 2016-07-17 NOTE — Discharge Instructions (Signed)
Please read and follow all provided instructions.  Your diagnoses today include:  1. Influenza A     Tests performed today include: Vital signs. See below for your results today.   Medications prescribed:  Take as prescribed   Home care instructions:  Follow any educational materials contained in this packet.  Follow-up instructions: Please follow-up with your primary care provider for further evaluation of symptoms and treatment   Return instructions:  Please return to the Emergency Department if you do not get better, if you get worse, or new symptoms OR  - Fever (temperature greater than 101.14F)  - Bleeding that does not stop with holding pressure to the area    -Severe pain (please note that you may be more sore the day after your accident)  - Chest Pain  - Difficulty breathing  - Severe nausea or vomiting  - Inability to tolerate food and liquids  - Passing out  - Skin becoming red around your wounds  - Change in mental status (confusion or lethargy)  - New numbness or weakness    Please return if you have any other emergent concerns.  Additional Information:  Your vital signs today were: BP 152/88 (BP Location: Left Arm)    Pulse 95    Temp 103 F (39.4 C) (Oral)    Resp 18    Ht 5' (1.524 m)    Wt 101.6 kg    SpO2 100%    BMI 43.75 kg/m  If your blood pressure (BP) was elevated above 135/85 this visit, please have this repeated by your doctor within one month. ---------------

## 2016-07-17 NOTE — ED Provider Notes (Signed)
[facility] DEPT Provider Note   CSN: [id number] Arrival date & time: 07/16/16  2109  By signing my name below, I, Helen Hardy, attest that this documentation has been prepared under the direction and in the presence of [name], PA-C.  Electronically Signed: Julien Hardy, ED Scribe. 07/17/16. 3:11 AM.    History   Chief Complaint Chief Complaint  Patient presents with  . flu like symptoms   The history is provided by the patient. No language interpreter was used.   HPI Comments: Helen Hardy is a 66 y.o. female who has a PMhx of arthritis, non-Hodgkin's lymphoma, GERD, HTN presents to the Emergency Department presenting with gradual worsening, moderate, flu-like symptoms x 2 days. She has been having a productive cough with yellow sputum, chills, fever (tmax 103),  nausea, dysuria, and mild lower abdominal pain. Pt reports going to a funeral two days ago and states that she began to feel bad shortly after. Pt has been consuming tea and honey to try and relieve her symptoms without relief. Pt has further tried taking a hydrocodone that has not given her any relief. She was seen by her PCP yesterday and was diagnosed with a yeast infection in which she was placed on an unknown antibiotics. She has a hx of non-Hodgkin's lymphoma and has been in remission since 2005. Pt denies shortness of breath, vomiting, and diarrhea.   Past Medical History:  Diagnosis Date  . Anemia   . Arthritis    Rheumatoid  . Cancer ([place])    Non-Hodgkins Lymphoma  . GERD (gastroesophageal reflux disease)   . Hypertension   . 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 Numbness and tingling of right leg   . Pneumonia     Patient Active Problem List   Diagnosis Date Noted  . 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 Iron deficiency anemia 06/21/2015  . 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 Morbid obesity ([place]) 04/10/2015  . 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 S/P knee replacement 04/08/2015    Past  Surgical History:  Procedure Laterality Date  . 2012 Right big toe and toe beside - had pins in toes    . ABDOMINAL HYSTERECTOMY    . bladder tack - 2005    . EYE SURGERY Bilateral    cataract  . JOINT REPLACEMENT     both knee replacement  . LUMBAR LAMINECTOMY/DECOMPRESSION MICRODISCECTOMY N/A 03/04/2016   Procedure: RIGHT FAR LATERAL DISCECTOMY L5-S1;  Surgeon: Melina Schools, MD;  Location: [facility];  Service: Orthopedics;  Laterality: N/A;  . ROTATOR CUFF REPAIR Left   . TOTAL KNEE REVISION Left 04/08/2015   Procedure: REVISION LEFT TOTAL KNEE ;  Surgeon: Paralee Cancel, MD;  Location: [facility];  Service: Orthopedics;  Laterality: Left;    OB History    No data available       Home Medications    Prior to Admission medications   Medication Sig Start Date End Date Taking? Authorizing Provider  amLODipine (NORVASC) 10 MG tablet Take 10 mg by mouth daily.    Historical Provider, MD  estradiol (ESTRACE) 1 MG tablet Take 1 mg by mouth daily.  12/25/12   Historical Provider, MD  gabapentin (NEURONTIN) 300 MG capsule Take 300 mg by mouth every evening. 04/28/16   Historical Provider, MD  leflunomide (ARAVA) 10 MG tablet Take 10 mg by mouth daily.  06/18/15   Historical Provider, MD  methocarbamol (ROBAXIN) 500 MG tablet Take 1 tablet (500 mg total) by mouth 3 (three)  times daily as needed for muscle spasms. 03/04/16   Melina Schools, MD  ondansetron (ZOFRAN) 4 MG tablet Take 1 tablet (4 mg total) by mouth every 8 (eight) hours as needed for nausea or vomiting. 03/04/16   Melina Schools, MD  oxyCODONE-acetaminophen (PERCOCET) 10-325 MG tablet Take 1 tablet by mouth every 4 (four) hours as needed for pain. 03/04/16   Melina Schools, MD  pantoprazole (PROTONIX) 40 MG tablet Take 40 mg by mouth 2 (two) times daily.     Historical Provider, MD  predniSONE (DELTASONE) 5 MG tablet Take 5 mg by mouth daily.  10/29/14   Historical Provider, MD  triamcinolone cream (KENALOG) 0.1 % Apply 1 application topically  as needed (for rash).     Historical Provider, MD  vitamin B-12 (CYANOCOBALAMIN) 1000 MCG tablet Take 1,000 mcg by mouth daily.    Historical Provider, MD    Family History History reviewed. No pertinent family history.  Social History Social History  Substance Use Topics  . Smoking status: Never Smoker  . Smokeless tobacco: Never Used  . Alcohol use No     Allergies   Oxycodone   Review of Systems Review of Systems  A complete 10 system review of systems was obtained and all systems are negative except as noted in the HPI and PMH.    Physical Exam Updated Vital Signs BP 126/89 (BP Location: Left Arm)   Pulse (!) 132   Temp 100.2 F (37.9 C) (Oral)   Resp 20   SpO2 94%   Physical Exam  Constitutional: She is oriented to person, place, and time. She appears well-developed and well-nourished. No distress.  HENT:  Head: Normocephalic and atraumatic.  Right Ear: Tympanic membrane, external ear and ear canal normal.  Left Ear: Tympanic membrane, external ear and ear canal normal.  Nose: Nose normal.  Mouth/Throat: Uvula is midline, oropharynx is clear and moist and mucous membranes are normal. No trismus in the jaw. No oropharyngeal exudate, posterior oropharyngeal erythema or tonsillar abscesses.  Eyes: EOM are normal. Pupils are equal, round, and reactive to light.  Neck: Normal range of motion. Neck supple. No tracheal deviation present.  Cardiovascular: Normal rate, regular rhythm, S1 normal, S2 normal, normal heart sounds, intact distal pulses and normal pulses.   Pulmonary/Chest: Effort normal and breath sounds normal. No respiratory distress. She has no decreased breath sounds. She has no wheezes. She has no rhonchi. She has no rales.  Abdominal: Normal appearance and bowel sounds are normal. She exhibits no distension. There is no tenderness.  Musculoskeletal: Normal range of motion.  Neurological: She is alert and oriented to person, place, and time.  Skin: Skin  is warm and dry.  Psychiatric: She has a normal mood and affect. Her speech is normal and behavior is normal. Thought content normal.  Nursing note and vitals reviewed.  ED Treatments / Results  DIAGNOSTIC STUDIES: Oxygen Saturation is 94% on RA, adequate by my interpretation.  COORDINATION OF CARE:  3:11 AM Discussed treatment plan with pt at bedside and pt agreed to plan.  Labs (all labs ordered are listed, but only abnormal results are displayed) Labs Reviewed  CBC - Abnormal; Notable for the following:       Result Value   RBC 3.14 (*)    Hemoglobin 10.8 (*)    HCT 32.5 (*)    MCV 103.5 (*)    MCH 34.4 (*)    Platelets 140 (*)    All other components within normal limits  INFLUENZA PANEL BY PCR (TYPE A & B, H1N1) - Abnormal; Notable for the following:    Influenza A By PCR POSITIVE (*)    All other components within normal limits  BASIC METABOLIC PANEL  URINALYSIS, ROUTINE W REFLEX MICROSCOPIC  I-STAT CG4 LACTIC ACID, ED   EKG  EKG Interpretation None      Radiology Dg Chest 2 View  Result Date: 07/17/2016 CLINICAL DATA:  Nausea, vomiting, and cough for 48 hours. Weakness and dehydration. Fever. EXAM: CHEST  2 VIEW COMPARISON:  09/28/2015 FINDINGS: Shallow inspiration with linear atelectasis in the lung bases. Normal heart size and pulmonary vascularity. No focal airspace disease or consolidation in the lungs. No blunting of costophrenic angles. No pneumothorax. Mediastinal contours appear intact. Degenerative changes in the spine. Tortuous aorta. IMPRESSION: Shallow inspiration with linear atelectasis in the lung bases. No evidence of active disease. Electronically Signed   By: Lucienne Capers M.D.   On: 07/17/2016 03:06    Procedures Procedures (including critical care time)  Medications Ordered in ED Medications  sodium chloride 0.9 % bolus 1,000 mL (1,000 mLs Intravenous New Bag/Given 07/17/16 0328)  acetaminophen (TYLENOL) tablet 650 mg (650 mg Oral Given  07/17/16 0348)   Initial Impression / Assessment and Plan / ED Course  I have reviewed the triage vital signs and the nursing notes.  Pertinent labs & imaging results that were available during my care of the patient were reviewed by me and considered in my medical decision making (see chart for details).  Clinical Course    Final Clinical Impressions(s) / ED Diagnoses  I have reviewed and evaluated the relevant laboratory values. I have reviewed and evaluated the relevant imaging studies.  I have reviewed the relevant previous healthcare records. I obtained HPI from historian.  ED Course:  Assessment: Pt is a 65yF with hx HTN, non-hodgkins (in remission) who presents with URI symptoms x2 days. On exam, pt in NAD. Nontoxic/nonseptic appearing. VS with tachycardia. Normotensive. Febrile 102F. Lungs CTA. Heart RRR. Abdomen nontender soft.. istat lactic 0.87. CBC unremarkable. UA negative. CXR negative for acute infiltrate. Given NS bolus and tylenol in ED. Flu panel positive for Influenza A. Plan is to DC home with Tamiflu. Pt understands. Requests to go home. Discussed risks and benefits with strict return precautions.. At time of discharge, Patient is in no acute distress. Vital Signs are stable. Patient is able to ambulate. Patient able to tolerate PO.   Disposition/Plan:  DC Home Additional Verbal discharge instructions given and discussed with patient.  Pt Instructed to f/u with PCP in the next week for evaluation and treatment of symptoms. Return precautions given Pt acknowledges and agrees with plan  Supervising Physician Veryl Speak, MD   Final diagnoses:  Influenza A   I personally performed the services described in this documentation, which was scribed in my presence. The recorded information has been reviewed and is accurate.   New Prescriptions New Prescriptions   No medications on file     [name], PA-C 07/17/16 [place], MD 07/17/16 [phone]

## 2016-07-31 DIAGNOSIS — M0589 Other rheumatoid arthritis with rheumatoid factor of multiple sites: Secondary | ICD-10-CM | POA: Diagnosis not present

## 2016-08-24 DIAGNOSIS — E559 Vitamin D deficiency, unspecified: Secondary | ICD-10-CM | POA: Diagnosis not present

## 2016-08-24 DIAGNOSIS — I10 Essential (primary) hypertension: Secondary | ICD-10-CM | POA: Diagnosis not present

## 2016-08-24 DIAGNOSIS — R5383 Other fatigue: Secondary | ICD-10-CM | POA: Diagnosis not present

## 2016-08-24 DIAGNOSIS — Z0000 Encounter for general adult medical examination without abnormal findings: Secondary | ICD-10-CM | POA: Diagnosis not present

## 2016-08-27 DIAGNOSIS — R5383 Other fatigue: Secondary | ICD-10-CM | POA: Diagnosis not present

## 2016-08-27 DIAGNOSIS — M159 Polyosteoarthritis, unspecified: Secondary | ICD-10-CM | POA: Diagnosis not present

## 2016-08-27 DIAGNOSIS — I10 Essential (primary) hypertension: Secondary | ICD-10-CM | POA: Diagnosis not present

## 2016-08-27 DIAGNOSIS — Z86718 Personal history of other venous thrombosis and embolism: Secondary | ICD-10-CM | POA: Diagnosis not present

## 2016-08-28 DIAGNOSIS — M0589 Other rheumatoid arthritis with rheumatoid factor of multiple sites: Secondary | ICD-10-CM | POA: Diagnosis not present

## 2016-08-31 DIAGNOSIS — M4316 Spondylolisthesis, lumbar region: Secondary | ICD-10-CM | POA: Diagnosis not present

## 2016-08-31 DIAGNOSIS — Z4789 Encounter for other orthopedic aftercare: Secondary | ICD-10-CM | POA: Diagnosis not present

## 2016-09-11 DIAGNOSIS — M545 Low back pain: Secondary | ICD-10-CM | POA: Diagnosis not present

## 2016-09-11 DIAGNOSIS — M5136 Other intervertebral disc degeneration, lumbar region: Secondary | ICD-10-CM | POA: Diagnosis not present

## 2016-09-11 DIAGNOSIS — M5117 Intervertebral disc disorders with radiculopathy, lumbosacral region: Secondary | ICD-10-CM | POA: Diagnosis not present

## 2016-09-11 DIAGNOSIS — M5127 Other intervertebral disc displacement, lumbosacral region: Secondary | ICD-10-CM | POA: Diagnosis not present

## 2016-09-11 DIAGNOSIS — M961 Postlaminectomy syndrome, not elsewhere classified: Secondary | ICD-10-CM | POA: Diagnosis not present

## 2016-09-14 DIAGNOSIS — Z79899 Other long term (current) drug therapy: Secondary | ICD-10-CM | POA: Diagnosis not present

## 2016-09-18 ENCOUNTER — Ambulatory Visit (HOSPITAL_BASED_OUTPATIENT_CLINIC_OR_DEPARTMENT_OTHER): Payer: Medicare Other | Admitting: Family

## 2016-09-18 ENCOUNTER — Other Ambulatory Visit (HOSPITAL_BASED_OUTPATIENT_CLINIC_OR_DEPARTMENT_OTHER): Payer: Medicare Other

## 2016-09-18 ENCOUNTER — Telehealth: Payer: Self-pay | Admitting: *Deleted

## 2016-09-18 ENCOUNTER — Encounter: Payer: Self-pay | Admitting: Family

## 2016-09-18 VITALS — BP 154/96 | HR 70 | Temp 97.7°F | Resp 17 | Wt 225.0 lb

## 2016-09-18 DIAGNOSIS — E559 Vitamin D deficiency, unspecified: Secondary | ICD-10-CM

## 2016-09-18 DIAGNOSIS — K909 Intestinal malabsorption, unspecified: Secondary | ICD-10-CM

## 2016-09-18 DIAGNOSIS — M545 Low back pain: Secondary | ICD-10-CM | POA: Diagnosis not present

## 2016-09-18 DIAGNOSIS — D509 Iron deficiency anemia, unspecified: Secondary | ICD-10-CM

## 2016-09-18 DIAGNOSIS — Z8572 Personal history of non-Hodgkin lymphomas: Secondary | ICD-10-CM | POA: Diagnosis not present

## 2016-09-18 DIAGNOSIS — D573 Sickle-cell trait: Secondary | ICD-10-CM | POA: Diagnosis not present

## 2016-09-18 DIAGNOSIS — G8929 Other chronic pain: Secondary | ICD-10-CM | POA: Diagnosis not present

## 2016-09-18 LAB — CBC WITH DIFFERENTIAL (CANCER CENTER ONLY)
BASO#: 0 10*3/uL (ref 0.0–0.2)
BASO%: 0.3 % (ref 0.0–2.0)
EOS%: 1 % (ref 0.0–7.0)
Eosinophils Absolute: 0.1 10*3/uL (ref 0.0–0.5)
HCT: 31.9 % — ABNORMAL LOW (ref 34.8–46.6)
HGB: 10.5 g/dL — ABNORMAL LOW (ref 11.6–15.9)
LYMPH#: 1.7 10*3/uL (ref 0.9–3.3)
LYMPH%: 27.2 % (ref 14.0–48.0)
MCH: 35.4 pg — ABNORMAL HIGH (ref 26.0–34.0)
MCHC: 32.9 g/dL (ref 32.0–36.0)
MCV: 107 fL — ABNORMAL HIGH (ref 81–101)
MONO#: 0.6 10*3/uL (ref 0.1–0.9)
MONO%: 9.2 % (ref 0.0–13.0)
NEUT#: 3.8 10*3/uL (ref 1.5–6.5)
NEUT%: 62.3 % (ref 39.6–80.0)
Platelets: 181 10*3/uL (ref 145–400)
RBC: 2.97 10*6/uL — ABNORMAL LOW (ref 3.70–5.32)
RDW: 15.7 % (ref 11.1–15.7)
WBC: 6.1 10*3/uL (ref 3.9–10.0)

## 2016-09-18 LAB — COMPREHENSIVE METABOLIC PANEL
ALT: 14 U/L (ref 0–55)
AST: 14 U/L (ref 5–34)
Albumin: 3.7 g/dL (ref 3.5–5.0)
Alkaline Phosphatase: 65 U/L (ref 40–150)
Anion Gap: 8 mEq/L (ref 3–11)
BUN: 19.3 mg/dL (ref 7.0–26.0)
CO2: 27 mEq/L (ref 22–29)
Calcium: 9.4 mg/dL (ref 8.4–10.4)
Chloride: 108 mEq/L (ref 98–109)
Creatinine: 0.8 mg/dL (ref 0.6–1.1)
EGFR: 83 mL/min/{1.73_m2} — ABNORMAL LOW (ref >90)
Glucose: 87 mg/dl (ref 70–140)
Potassium: 4.2 mEq/L (ref 3.5–5.1)
Sodium: 144 mEq/L (ref 136–145)
Total Bilirubin: 0.39 mg/dL (ref 0.20–1.20)
Total Protein: 6.8 g/dL (ref 6.4–8.3)

## 2016-09-18 LAB — IRON AND TIBC
%SAT: 37 % (ref 21–57)
Iron: 98 ug/dL (ref 41–142)
TIBC: 261 ug/dL (ref 236–444)
UIBC: 163 ug/dL (ref 120–384)

## 2016-09-18 LAB — FERRITIN: Ferritin: 441 ng/ml — ABNORMAL HIGH (ref 9–269)

## 2016-09-18 NOTE — Telephone Encounter (Addendum)
Patient aware of results  ----- Message from Eliezer Bottom, NP sent at 09/18/2016 12:36 PM EDT ----- Regarding: Iron  Iron stable, no infusion needed at this time. Peter Minium ou!  Sarah  ----- Message ----- From: Interface, Lab In Three Zero One Sent: 09/18/2016   9:12 AM To: Eliezer Bottom, NP

## 2016-09-18 NOTE — Progress Notes (Signed)
Hematology and Oncology Follow Up Visit  CASI WESTERFELD [id number] 04-07-1951 66 y.o. 09/18/2016   Principle Diagnosis:  1. 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 Sickle cell trait 3. Anemia of chronic disease 4.  Iron deficiency anemia  Current Therapy:   IV Iron as indicated - last dose received in December 3704 Folic acid OTC PO daily    Interim History:  Ms. Winebarger is here today for a follow-up. She states that she is feeling fatigued. She has chronic back pain and recently received a steroid injection. She states that she can not tell a difference yet. She will be restarting PT on Monday next week. She feels that her pain is managed fairly well on percocet. Hgb is 10.5 with an MCV of 107. She has not required IV iron in over a year. Her Iron saturation in November was 49% with a ferritin of 400.  No episodes of bleeding, bruising or petechiae. No lymphadenopathy found on exam. No fever, chills, n/v, cough, rash, dizziness, SOB, chest pain, palpitations, abdominal pain or changes in bowel or bladder habits. She has occasional constipation and drinks prune juice as needed.  The neuropathy in her feet is unchanged. No swelling or tenderness in her extremities. No c/o joint aches or "bone" pain.  She has maintained a good appetite and is staying well hydrated. Her weight is stable.   Medications:  Allergies as of 09/18/2016      Reactions   Oxycodone Nausea And Vomiting      Medication List       Accurate as of 09/18/16  9:25 AM. Always use your most recent med list.          amLODipine 10 MG tablet Commonly known as:  NORVASC Take 10 mg by mouth daily.   estradiol 1 MG tablet Commonly known as:  ESTRACE Take 1 mg by mouth daily.   gabapentin 300 MG capsule Commonly known as:  NEURONTIN Take 300 mg by mouth every evening.   leflunomide 10 MG tablet Commonly known as:  ARAVA Take 10 mg by mouth daily.   methocarbamol 500 MG  tablet Commonly known as:  ROBAXIN Take 1 tablet (500 mg total) by mouth 3 (three) times daily as needed for muscle spasms.   ondansetron 4 MG tablet Commonly known as:  ZOFRAN Take 1 tablet (4 mg total) by mouth every 8 (eight) hours as needed for nausea or vomiting.   oseltamivir 75 MG capsule Commonly known as:  TAMIFLU Take 1 capsule (75 mg total) by mouth 2 (two) times daily.   oxyCODONE-acetaminophen 10-325 MG tablet Commonly known as:  PERCOCET Take 1 tablet by mouth every 4 (four) hours as needed for pain.   pantoprazole 40 MG tablet Commonly known as:  PROTONIX Take 40 mg by mouth 2 (two) times daily.   predniSONE 5 MG tablet Commonly known as:  DELTASONE Take 5 mg by mouth daily.   triamcinolone cream 0.1 % Commonly known as:  KENALOG Apply 1 application topically as needed (for rash).   vitamin B-12 1000 MCG tablet Commonly known as:  CYANOCOBALAMIN Take 1,000 mcg by mouth daily.       Allergies:  Allergies  Allergen Reactions  . Oxycodone Nausea And Vomiting    Past Medical History, Surgical history, Social history, and Family History were reviewed and updated.  Review of Systems: All other 10 point review of systems is negative.   Physical Exam:  weight is 225 lb (102.1 kg). Her oral  temperature is 97.7 F (36.5 C). Her blood pressure is 154/96 (abnormal) and her pulse is 70. Her respiration is 17 and oxygen saturation is 100%.   Wt Readings from Last 3 Encounters:  09/18/16 225 lb (102.1 kg)  07/17/16 224 lb (101.6 kg)  05/21/16 226 lb (102.5 kg)    Ocular: Sclerae unicteric, pupils equal, round and reactive to light Ear-nose-throat: Oropharynx clear, dentition fair Lymphatic: No cervical supraclavicular or axillary adenopathy Lungs no rales or rhonchi, good excursion bilaterally Heart regular rate and rhythm, no murmur appreciated Abd soft, nontender, positive bowel sounds, no liver or spleen tip palpated on exam, no fluid wave MSK no focal  spinal tenderness, no joint edema Neuro: non-focal, well-oriented, appropriate affect Breasts: Deferred  Lab Results  Component Value Date   WBC 6.1 09/18/2016   HGB 10.5 (L) 09/18/2016   HCT 31.9 (L) 09/18/2016   MCV 107 (H) 09/18/2016   PLT 181 09/18/2016   Lab Results  Component Value Date   FERRITIN 400 (H) 05/21/2016   IRON 128 05/21/2016   TIBC 263 05/21/2016   UIBC 135 05/21/2016   IRONPCTSAT 49 05/21/2016   Lab Results  Component Value Date   RETICCTPCT 1.4 06/21/2015   RBC 2.97 (L) 09/18/2016   RETICCTABS 41.0 06/21/2015   No results found for: KPAFRELGTCHN, LAMBDASER, KAPLAMBRATIO No results found for: IGGSERUM, IGA, IGMSERUM No results found for: Odetta Pink, SPEI   Chemistry      Component Value Date/Time   NA 138 07/17/2016 0325   NA 142 05/21/2016 0928   K 3.7 07/17/2016 0325   K 4.0 05/21/2016 0928   CL 102 07/17/2016 0325   CL 103 06/21/2015 0800   CO2 28 07/17/2016 0325   CO2 23 05/21/2016 0928   BUN 13 07/17/2016 0325   BUN 13.4 05/21/2016 0928   CREATININE 0.91 07/17/2016 0325   CREATININE 0.8 05/21/2016 0928      Component Value Date/Time   CALCIUM 8.9 07/17/2016 0325   CALCIUM 9.4 05/21/2016 0928   ALKPHOS 67 05/21/2016 0928   AST 16 05/21/2016 0928   ALT 14 05/21/2016 0928   BILITOT 0.39 05/21/2016 0928     Impression and Plan: [name] is 66 yo African [place] female with past history of low-grade non-Hodgkin's lymphoma and completed treatment over 13 years ago. So far, she has done well and there has been no evidence of recurrence.  She also has the sickle cell trait and intermittent iron deficiency anemia.  She takes folic acid OTC PO daily and has not required Feraheme in over a year.  We will see what her iron studies show and bring her in next week for an infusion if needed.  We will plan to see her back in 3 months for repeat lab work and follow-up. She will contact  our office with any questions or concerns. We can certainly see her sooner if needed.   Eliezer Bottom, NP 3/16/20189:25 AM

## 2016-09-19 LAB — VITAMIN D 25 HYDROXY (VIT D DEFICIENCY, FRACTURES): Vitamin D, 25-Hydroxy: 37.4 ng/mL (ref 30.0–100.0)

## 2016-09-19 LAB — RETICULOCYTES: Reticulocyte Count: 2.7 % — ABNORMAL HIGH (ref 0.6–2.6)

## 2016-09-21 ENCOUNTER — Telehealth: Payer: Self-pay | Admitting: *Deleted

## 2016-09-21 DIAGNOSIS — M5136 Other intervertebral disc degeneration, lumbar region: Secondary | ICD-10-CM | POA: Diagnosis not present

## 2016-09-21 NOTE — Telephone Encounter (Signed)
This RN left a message on cell phone informing patient that her counts are good, and there is no need for an iron infusion at this time. Will follow up in June.

## 2016-09-23 IMAGING — CR DG CHEST 2V
2 series · 2 of 2 positions shown · non-contrast
Comparison: 06/16/2015

CLINICAL DATA: Cough x 2 weeks. Pain during cough. Using meds with
no relief. No fever. Nausea. Pt had UGI yesterday, hx hodgkin's
lymphoma, non-smoker.

EXAM:
CHEST  2 VIEW

[w chest pa]
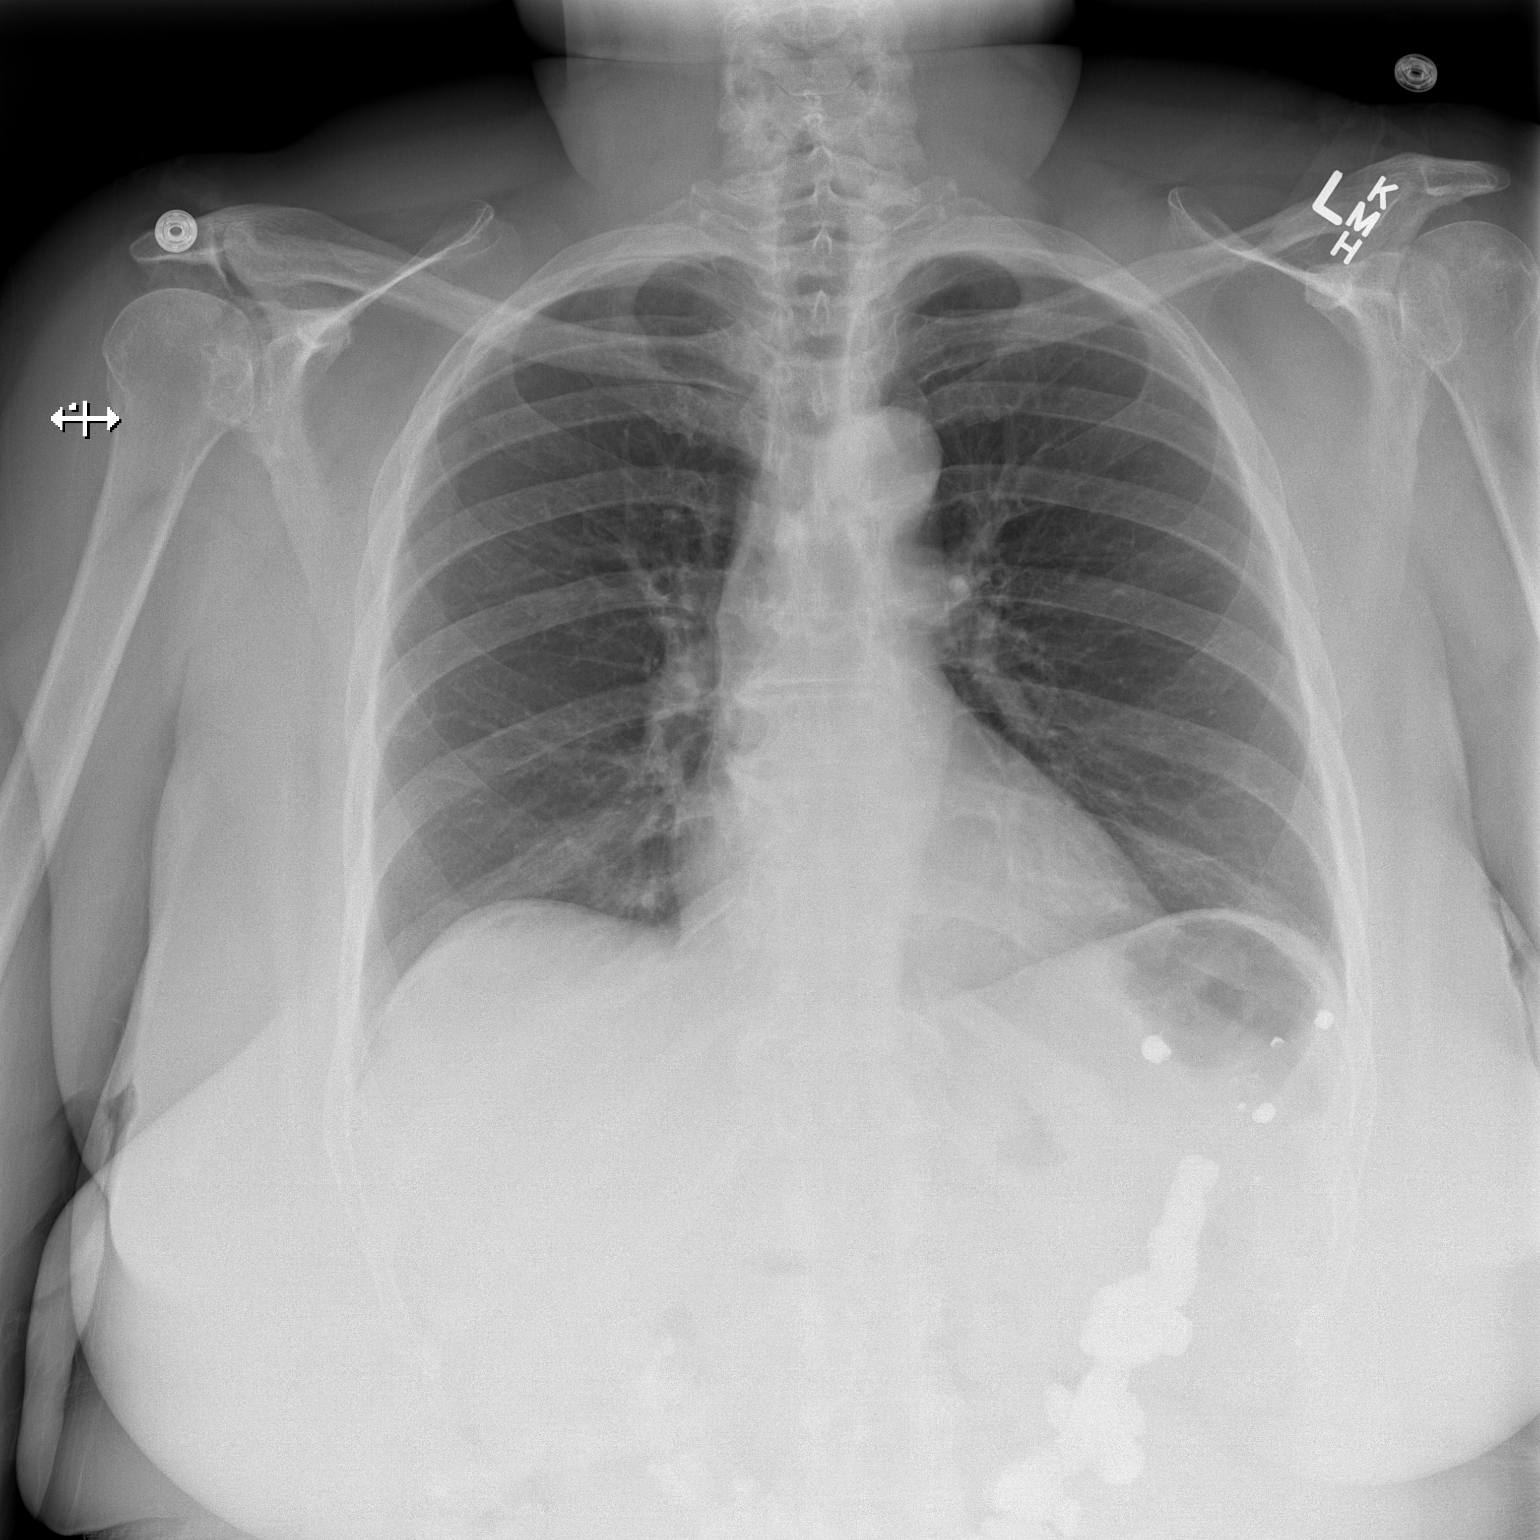

[w chest lat]
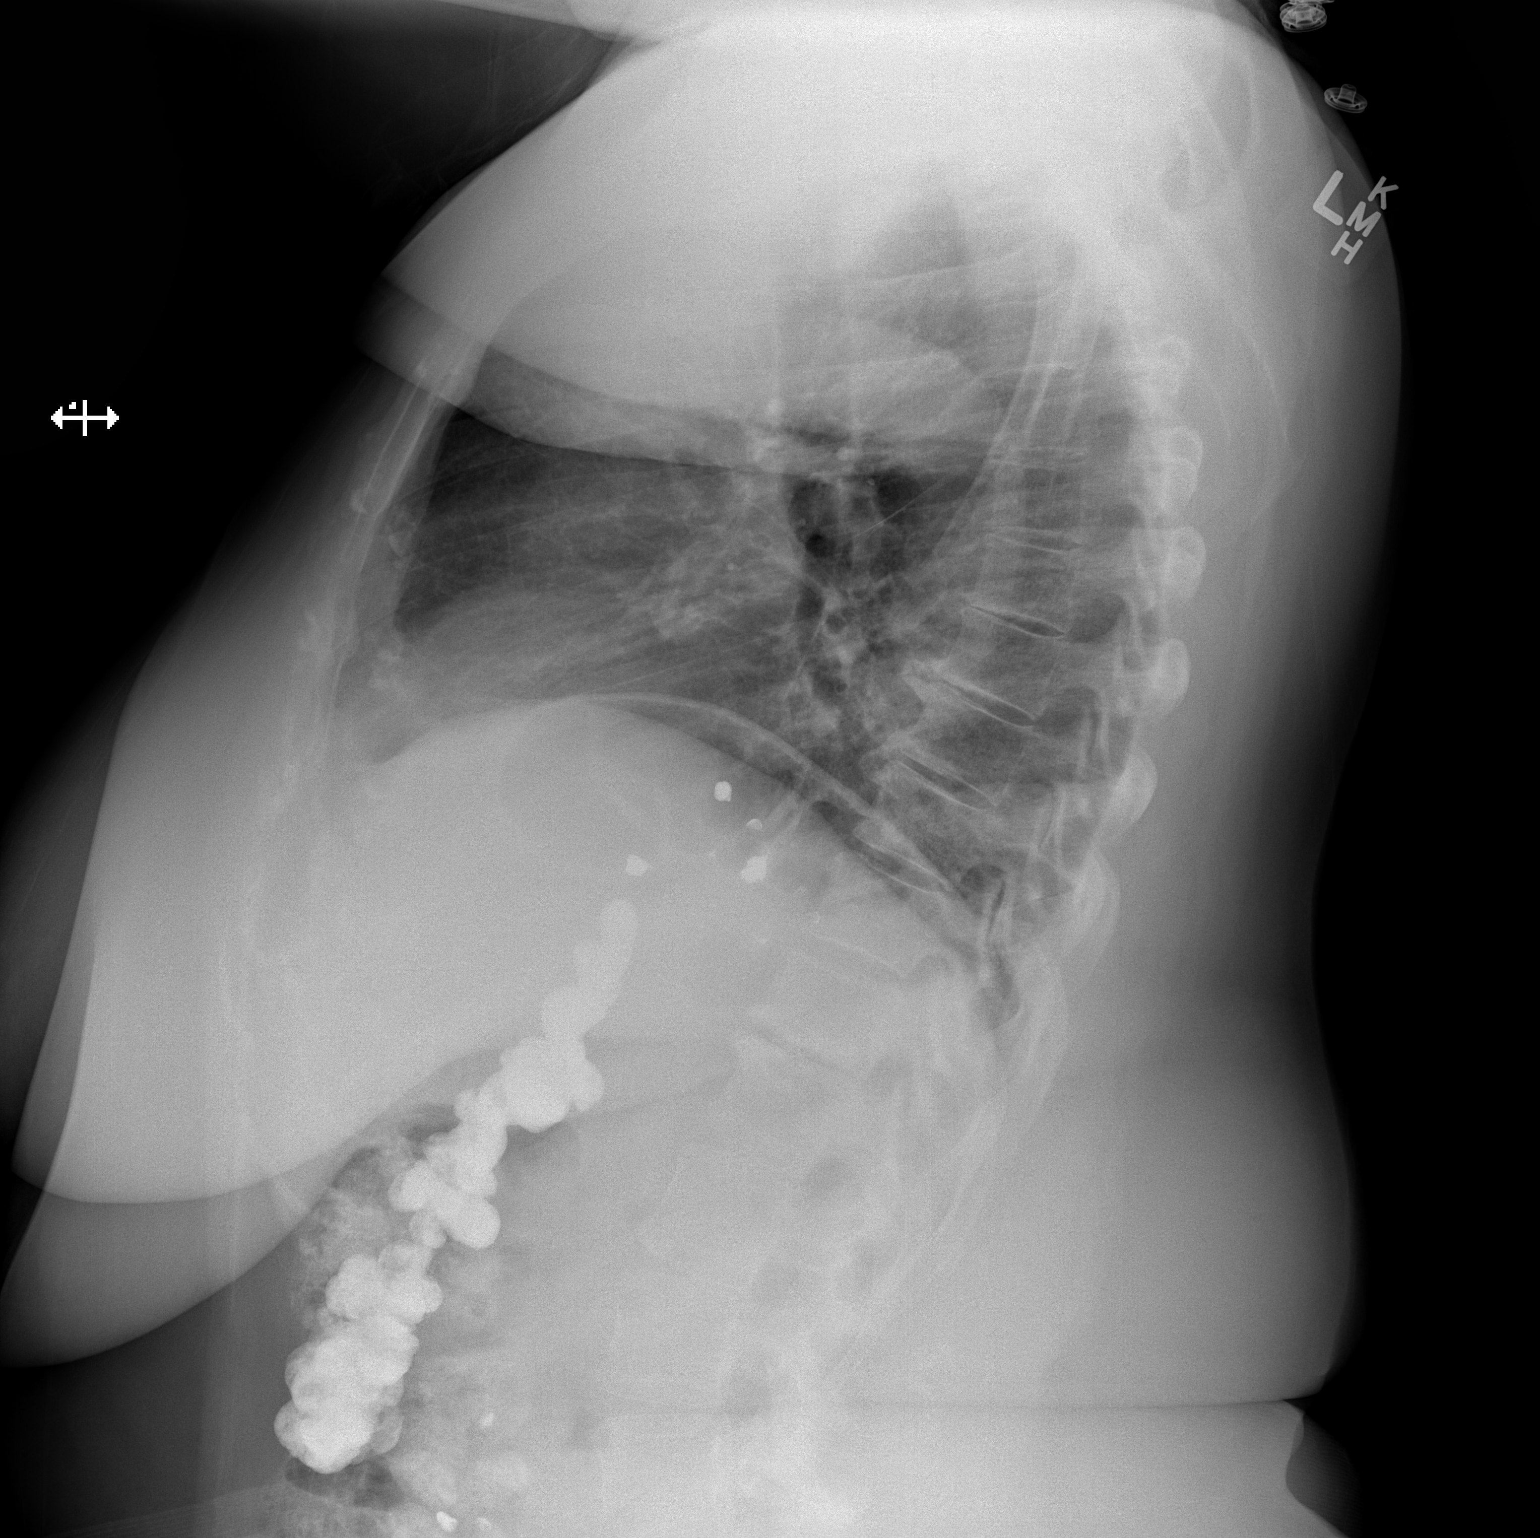

[2 of 2 positions shown; findings below may reference images not displayed]

FINDINGS: Cardiac silhouette normal in size and configuration.

Normal mediastinal hilar contours.

Lungs are clear.  No pleural effusion or pneumothorax.

Bony thorax is intact.
IMPRESSION: No active cardiopulmonary disease.

## 2016-09-25 DIAGNOSIS — M0589 Other rheumatoid arthritis with rheumatoid factor of multiple sites: Secondary | ICD-10-CM | POA: Diagnosis not present

## 2016-10-05 DIAGNOSIS — A630 Anogenital (venereal) warts: Secondary | ICD-10-CM | POA: Diagnosis not present

## 2016-10-19 DIAGNOSIS — Z4789 Encounter for other orthopedic aftercare: Secondary | ICD-10-CM | POA: Diagnosis not present

## 2016-10-23 DIAGNOSIS — M0579 Rheumatoid arthritis with rheumatoid factor of multiple sites without organ or systems involvement: Secondary | ICD-10-CM | POA: Diagnosis not present

## 2016-10-23 DIAGNOSIS — M0589 Other rheumatoid arthritis with rheumatoid factor of multiple sites: Secondary | ICD-10-CM | POA: Diagnosis not present

## 2016-11-04 DIAGNOSIS — A630 Anogenital (venereal) warts: Secondary | ICD-10-CM | POA: Diagnosis not present

## 2016-11-20 DIAGNOSIS — M0589 Other rheumatoid arthritis with rheumatoid factor of multiple sites: Secondary | ICD-10-CM | POA: Diagnosis not present

## 2016-12-07 DIAGNOSIS — A630 Anogenital (venereal) warts: Secondary | ICD-10-CM | POA: Diagnosis not present

## 2016-12-14 DIAGNOSIS — A630 Anogenital (venereal) warts: Secondary | ICD-10-CM | POA: Diagnosis not present

## 2016-12-14 DIAGNOSIS — N909 Noninflammatory disorder of vulva and perineum, unspecified: Secondary | ICD-10-CM | POA: Diagnosis not present

## 2016-12-21 DIAGNOSIS — A630 Anogenital (venereal) warts: Secondary | ICD-10-CM | POA: Diagnosis not present

## 2016-12-21 DIAGNOSIS — N909 Noninflammatory disorder of vulva and perineum, unspecified: Secondary | ICD-10-CM | POA: Diagnosis not present

## 2016-12-23 DIAGNOSIS — Z86718 Personal history of other venous thrombosis and embolism: Secondary | ICD-10-CM | POA: Diagnosis not present

## 2016-12-23 DIAGNOSIS — M159 Polyosteoarthritis, unspecified: Secondary | ICD-10-CM | POA: Diagnosis not present

## 2016-12-23 DIAGNOSIS — Z8572 Personal history of non-Hodgkin lymphomas: Secondary | ICD-10-CM | POA: Diagnosis not present

## 2016-12-23 DIAGNOSIS — M0589 Other rheumatoid arthritis with rheumatoid factor of multiple sites: Secondary | ICD-10-CM | POA: Diagnosis not present

## 2016-12-25 ENCOUNTER — Ambulatory Visit (HOSPITAL_BASED_OUTPATIENT_CLINIC_OR_DEPARTMENT_OTHER): Payer: Medicare Other | Admitting: Family

## 2016-12-25 ENCOUNTER — Other Ambulatory Visit (HOSPITAL_BASED_OUTPATIENT_CLINIC_OR_DEPARTMENT_OTHER): Payer: Medicare Other

## 2016-12-25 VITALS — BP 142/89 | HR 69 | Temp 98.1°F | Resp 18 | Wt 221.0 lb

## 2016-12-25 DIAGNOSIS — K909 Intestinal malabsorption, unspecified: Secondary | ICD-10-CM

## 2016-12-25 DIAGNOSIS — D509 Iron deficiency anemia, unspecified: Secondary | ICD-10-CM | POA: Diagnosis not present

## 2016-12-25 DIAGNOSIS — Z8572 Personal history of non-Hodgkin lymphomas: Secondary | ICD-10-CM

## 2016-12-25 DIAGNOSIS — G629 Polyneuropathy, unspecified: Secondary | ICD-10-CM | POA: Diagnosis not present

## 2016-12-25 LAB — CBC WITH DIFFERENTIAL (CANCER CENTER ONLY)
BASO#: 0 10*3/uL (ref 0.0–0.2)
BASO%: 0.2 % (ref 0.0–2.0)
EOS%: 2 % (ref 0.0–7.0)
Eosinophils Absolute: 0.1 10*3/uL (ref 0.0–0.5)
HCT: 32.7 % — ABNORMAL LOW (ref 34.8–46.6)
HGB: 11.1 g/dL — ABNORMAL LOW (ref 11.6–15.9)
LYMPH#: 1.8 10*3/uL (ref 0.9–3.3)
LYMPH%: 39.7 % (ref 14.0–48.0)
MCH: 35.4 pg — ABNORMAL HIGH (ref 26.0–34.0)
MCHC: 33.9 g/dL (ref 32.0–36.0)
MCV: 104 fL — ABNORMAL HIGH (ref 81–101)
MONO#: 0.5 10*3/uL (ref 0.1–0.9)
MONO%: 11.5 % (ref 0.0–13.0)
NEUT#: 2.1 10*3/uL (ref 1.5–6.5)
NEUT%: 46.6 % (ref 39.6–80.0)
Platelets: 136 10*3/uL — ABNORMAL LOW (ref 145–400)
RBC: 3.14 10*6/uL — ABNORMAL LOW (ref 3.70–5.32)
RDW: 14.1 % (ref 11.1–15.7)
WBC: 4.5 10*3/uL (ref 3.9–10.0)

## 2016-12-25 LAB — IRON AND TIBC
%SAT: 41 % (ref 21–57)
Iron: 104 ug/dL (ref 41–142)
TIBC: 251 ug/dL (ref 236–444)
UIBC: 148 ug/dL (ref 120–384)

## 2016-12-25 LAB — COMPREHENSIVE METABOLIC PANEL
ALT: 16 U/L (ref 0–55)
AST: 22 U/L (ref 5–34)
Albumin: 3.9 g/dL (ref 3.5–5.0)
Alkaline Phosphatase: 58 U/L (ref 40–150)
Anion Gap: 10 mEq/L (ref 3–11)
BUN: 15.1 mg/dL (ref 7.0–26.0)
CO2: 25 mEq/L (ref 22–29)
Calcium: 10 mg/dL (ref 8.4–10.4)
Chloride: 109 mEq/L (ref 98–109)
Creatinine: 0.8 mg/dL (ref 0.6–1.1)
EGFR: 89 mL/min/{1.73_m2} — ABNORMAL LOW (ref >90)
Glucose: 95 mg/dl (ref 70–140)
Potassium: 3.9 mEq/L (ref 3.5–5.1)
Sodium: 144 mEq/L (ref 136–145)
Total Bilirubin: 0.48 mg/dL (ref 0.20–1.20)
Total Protein: 6.7 g/dL (ref 6.4–8.3)

## 2016-12-25 LAB — FERRITIN: Ferritin: 608 ng/ml — ABNORMAL HIGH (ref 9–269)

## 2016-12-25 NOTE — Progress Notes (Signed)
Hematology and Oncology Follow Up Visit  Helen Hardy [id number] October 07, 1950 66 y.o. 12/25/2016   Principle Diagnosis:  1. 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 Sickle cell trait 3. Anemia of chronic disease 4. Iron deficiency anemia  Current Therapy:   IV Iron as indicated - last dose received in December 1287 Folic acid OTC PO daily   Interim History:  Helen Hardy is here today for follow-up. She is doing well and has no new complaints at this time. She denies fatigue and has been staying busy. She loves to go shopping at [place].  She is seeing Dr. Ulanda Edison and has had several vaginal lesions treated in the office and is now on Valtrex BID. She feels that this is improving.  No fever, chills, n/v, cough, rash, dizziness, SOB, chest pain, palpitations, abdominal pain or changes in bowel or bladder habits.  No episodes of bleeding, bruising or petechiae. No lymphadenopathy found on exam.  She has puffiness in her feet and ankles that comes and goes. This is not a new issue for her.  Neuropathy in her feet is unchanged. She also has some numbness and tingling in her hands due to arthritis. This is unchanged.  She has maintained a good appetite and is staying well hydrated. Her weight is stable.   ECOG Performance Status: 0 - Asymptomatic  Medications:  Allergies as of 12/25/2016      Reactions   Oxycodone Nausea And Vomiting      Medication List       Accurate as of 12/25/16  9:14 AM. Always use your most recent med list.          amLODipine 10 MG tablet Commonly known as:  NORVASC Take 10 mg by mouth daily.   estradiol 1 MG tablet Commonly known as:  ESTRACE Take 1 mg by mouth daily.   gabapentin 300 MG capsule Commonly known as:  NEURONTIN Take 300 mg by mouth every evening.   leflunomide 10 MG tablet Commonly known as:  ARAVA Take 10 mg by mouth daily.   methocarbamol 500 MG tablet Commonly known as:  ROBAXIN Take 1 tablet  (500 mg total) by mouth 3 (three) times daily as needed for muscle spasms.   ondansetron 4 MG tablet Commonly known as:  ZOFRAN Take 1 tablet (4 mg total) by mouth every 8 (eight) hours as needed for nausea or vomiting.   oseltamivir 75 MG capsule Commonly known as:  TAMIFLU Take 1 capsule (75 mg total) by mouth 2 (two) times daily.   oxyCODONE-acetaminophen 10-325 MG tablet Commonly known as:  PERCOCET Take 1 tablet by mouth every 4 (four) hours as needed for pain.   pantoprazole 40 MG tablet Commonly known as:  PROTONIX Take 40 mg by mouth 2 (two) times daily.   predniSONE 5 MG tablet Commonly known as:  DELTASONE Take 5 mg by mouth daily.   triamcinolone cream 0.1 % Commonly known as:  KENALOG Apply 1 application topically as needed (for rash).   vitamin B-12 1000 MCG tablet Commonly known as:  CYANOCOBALAMIN Take 1,000 mcg by mouth daily.       Allergies:  Allergies  Allergen Reactions  . Oxycodone Nausea And Vomiting    Past Medical History, Surgical history, Social history, and Family History were reviewed and updated.  Review of Systems: All other 10 point review of systems is negative.   Physical Exam:  vitals were not taken for this visit.  Wt Readings from Last 3 Encounters:  09/18/16 225 lb (102.1 kg)  07/17/16 224 lb (101.6 kg)  05/21/16 226 lb (102.5 kg)    Ocular: Sclerae unicteric, pupils equal, round and reactive to light Ear-nose-throat: Oropharynx clear, dentition fair Lymphatic: No cervical, supraclavicular or axillary adenopathy Lungs no rales or rhonchi, good excursion bilaterally Heart regular rate and rhythm, no murmur appreciated Abd soft, nontender, positive bowel sounds, no liver or spleen tip palpated on exam, no fluid wave MSK no focal spinal tenderness, no joint edema Neuro: non-focal, well-oriented, appropriate affect Breasts: Deferred   Lab Results  Component Value Date   WBC 4.5 12/25/2016   HGB 11.1 (L) 12/25/2016    HCT 32.7 (L) 12/25/2016   MCV 104 (H) 12/25/2016   PLT 136 (L) 12/25/2016   Lab Results  Component Value Date   FERRITIN 441 (H) 09/18/2016   IRON 98 09/18/2016   TIBC 261 09/18/2016   UIBC 163 09/18/2016   IRONPCTSAT 37 09/18/2016   Lab Results  Component Value Date   RETICCTPCT 1.4 06/21/2015   RBC 3.14 (L) 12/25/2016   RETICCTABS 41.0 06/21/2015   No results found for: KPAFRELGTCHN, LAMBDASER, KAPLAMBRATIO No results found for: IGGSERUM, IGA, IGMSERUM No results found for: Odetta Pink, SPEI   Chemistry      Component Value Date/Time   NA 144 09/18/2016 0858   K 4.2 09/18/2016 0858   CL 102 07/17/2016 0325   CL 103 06/21/2015 0800   CO2 27 09/18/2016 0858   BUN 19.3 09/18/2016 0858   CREATININE 0.8 09/18/2016 0858      Component Value Date/Time   CALCIUM 9.4 09/18/2016 0858   ALKPHOS 65 09/18/2016 0858   AST 14 09/18/2016 0858   ALT 14 09/18/2016 0858   BILITOT 0.39 09/18/2016 0858      Impression and Plan: Helen Hardy is 66 yo African American female with history of low grade non-Hodgkin's lymphoma and completed treatment over 14 years ago. She continues to do well and there has been no evidence of recurrence. She is doing well at this time.  We will see what her iron studies show and bring her back in for infusion next week if needed.  We will plan to see her back in 4 months for repeat lab work and follow-up.  She promises to contact our office with any questions or concerns. We can certainly see her sooner if need be.   Eliezer Bottom, NP 6/22/20189:14 AM

## 2016-12-26 LAB — RETICULOCYTES: Reticulocyte Count: 1.4 % (ref 0.6–2.6)

## 2016-12-28 DIAGNOSIS — N909 Noninflammatory disorder of vulva and perineum, unspecified: Secondary | ICD-10-CM | POA: Diagnosis not present

## 2017-01-15 DIAGNOSIS — M5137 Other intervertebral disc degeneration, lumbosacral region: Secondary | ICD-10-CM | POA: Diagnosis not present

## 2017-01-15 DIAGNOSIS — Z01812 Encounter for preprocedural laboratory examination: Secondary | ICD-10-CM | POA: Diagnosis not present

## 2017-01-15 DIAGNOSIS — M4316 Spondylolisthesis, lumbar region: Secondary | ICD-10-CM | POA: Diagnosis not present

## 2017-01-15 DIAGNOSIS — Z4789 Encounter for other orthopedic aftercare: Secondary | ICD-10-CM | POA: Diagnosis not present

## 2017-01-20 DIAGNOSIS — Z4789 Encounter for other orthopedic aftercare: Secondary | ICD-10-CM | POA: Diagnosis not present

## 2017-01-20 DIAGNOSIS — M5136 Other intervertebral disc degeneration, lumbar region: Secondary | ICD-10-CM | POA: Diagnosis not present

## 2017-01-21 DIAGNOSIS — M0579 Rheumatoid arthritis with rheumatoid factor of multiple sites without organ or systems involvement: Secondary | ICD-10-CM | POA: Diagnosis not present

## 2017-01-21 DIAGNOSIS — M0589 Other rheumatoid arthritis with rheumatoid factor of multiple sites: Secondary | ICD-10-CM | POA: Diagnosis not present

## 2017-02-01 DIAGNOSIS — Z4789 Encounter for other orthopedic aftercare: Secondary | ICD-10-CM | POA: Diagnosis not present

## 2017-02-01 DIAGNOSIS — M25511 Pain in right shoulder: Secondary | ICD-10-CM | POA: Diagnosis not present

## 2017-02-06 DIAGNOSIS — M25511 Pain in right shoulder: Secondary | ICD-10-CM | POA: Diagnosis not present

## 2017-02-19 DIAGNOSIS — M0589 Other rheumatoid arthritis with rheumatoid factor of multiple sites: Secondary | ICD-10-CM | POA: Diagnosis not present

## 2017-02-22 DIAGNOSIS — M12811 Other specific arthropathies, not elsewhere classified, right shoulder: Secondary | ICD-10-CM | POA: Diagnosis not present

## 2017-02-22 DIAGNOSIS — M75101 Unspecified rotator cuff tear or rupture of right shoulder, not specified as traumatic: Secondary | ICD-10-CM | POA: Diagnosis not present

## 2017-02-24 DIAGNOSIS — I10 Essential (primary) hypertension: Secondary | ICD-10-CM | POA: Diagnosis not present

## 2017-02-24 DIAGNOSIS — R7309 Other abnormal glucose: Secondary | ICD-10-CM | POA: Diagnosis not present

## 2017-02-26 DIAGNOSIS — M5136 Other intervertebral disc degeneration, lumbar region: Secondary | ICD-10-CM | POA: Diagnosis not present

## 2017-02-26 DIAGNOSIS — M5116 Intervertebral disc disorders with radiculopathy, lumbar region: Secondary | ICD-10-CM | POA: Diagnosis not present

## 2017-02-26 DIAGNOSIS — M5416 Radiculopathy, lumbar region: Secondary | ICD-10-CM | POA: Diagnosis not present

## 2017-03-02 DIAGNOSIS — D649 Anemia, unspecified: Secondary | ICD-10-CM | POA: Diagnosis not present

## 2017-03-02 DIAGNOSIS — I10 Essential (primary) hypertension: Secondary | ICD-10-CM | POA: Diagnosis not present

## 2017-03-05 DIAGNOSIS — M5136 Other intervertebral disc degeneration, lumbar region: Secondary | ICD-10-CM | POA: Diagnosis not present

## 2017-03-05 DIAGNOSIS — M47816 Spondylosis without myelopathy or radiculopathy, lumbar region: Secondary | ICD-10-CM | POA: Diagnosis not present

## 2017-03-22 DIAGNOSIS — M0589 Other rheumatoid arthritis with rheumatoid factor of multiple sites: Secondary | ICD-10-CM | POA: Diagnosis not present

## 2017-03-31 DIAGNOSIS — M2041 Other hammer toe(s) (acquired), right foot: Secondary | ICD-10-CM | POA: Diagnosis not present

## 2017-03-31 DIAGNOSIS — M25572 Pain in left ankle and joints of left foot: Secondary | ICD-10-CM | POA: Diagnosis not present

## 2017-03-31 DIAGNOSIS — M79671 Pain in right foot: Secondary | ICD-10-CM | POA: Diagnosis not present

## 2017-03-31 DIAGNOSIS — M79672 Pain in left foot: Secondary | ICD-10-CM | POA: Diagnosis not present

## 2017-03-31 DIAGNOSIS — M71572 Other bursitis, not elsewhere classified, left ankle and foot: Secondary | ICD-10-CM | POA: Diagnosis not present

## 2017-03-31 DIAGNOSIS — M2042 Other hammer toe(s) (acquired), left foot: Secondary | ICD-10-CM | POA: Diagnosis not present

## 2017-04-08 DIAGNOSIS — Z23 Encounter for immunization: Secondary | ICD-10-CM | POA: Diagnosis not present

## 2017-04-15 DIAGNOSIS — M65872 Other synovitis and tenosynovitis, left ankle and foot: Secondary | ICD-10-CM | POA: Diagnosis not present

## 2017-04-15 DIAGNOSIS — M25572 Pain in left ankle and joints of left foot: Secondary | ICD-10-CM | POA: Diagnosis not present

## 2017-04-19 DIAGNOSIS — M0589 Other rheumatoid arthritis with rheumatoid factor of multiple sites: Secondary | ICD-10-CM | POA: Diagnosis not present

## 2017-04-29 ENCOUNTER — Other Ambulatory Visit (HOSPITAL_BASED_OUTPATIENT_CLINIC_OR_DEPARTMENT_OTHER): Payer: Medicare Other

## 2017-04-29 ENCOUNTER — Ambulatory Visit (HOSPITAL_BASED_OUTPATIENT_CLINIC_OR_DEPARTMENT_OTHER): Payer: Medicare Other | Admitting: Family

## 2017-04-29 VITALS — BP 151/76 | HR 72 | Temp 97.9°F | Resp 20 | Wt 236.5 lb

## 2017-04-29 DIAGNOSIS — D509 Iron deficiency anemia, unspecified: Secondary | ICD-10-CM

## 2017-04-29 DIAGNOSIS — G629 Polyneuropathy, unspecified: Secondary | ICD-10-CM

## 2017-04-29 DIAGNOSIS — K909 Intestinal malabsorption, unspecified: Secondary | ICD-10-CM

## 2017-04-29 DIAGNOSIS — Z8572 Personal history of non-Hodgkin lymphomas: Secondary | ICD-10-CM

## 2017-04-29 LAB — CBC WITH DIFFERENTIAL (CANCER CENTER ONLY)
BASO#: 0 10*3/uL (ref 0.0–0.2)
BASO%: 0.2 % (ref 0.0–2.0)
EOS%: 1.2 % (ref 0.0–7.0)
Eosinophils Absolute: 0.1 10*3/uL (ref 0.0–0.5)
HCT: 32.3 % — ABNORMAL LOW (ref 34.8–46.6)
HGB: 10.9 g/dL — ABNORMAL LOW (ref 11.6–15.9)
LYMPH#: 1.7 10*3/uL (ref 0.9–3.3)
LYMPH%: 30 % (ref 14.0–48.0)
MCH: 36.9 pg — ABNORMAL HIGH (ref 26.0–34.0)
MCHC: 33.7 g/dL (ref 32.0–36.0)
MCV: 110 fL — ABNORMAL HIGH (ref 81–101)
MONO#: 0.6 10*3/uL (ref 0.1–0.9)
MONO%: 10.4 % (ref 0.0–13.0)
NEUT#: 3.3 10*3/uL (ref 1.5–6.5)
NEUT%: 58.2 % (ref 39.6–80.0)
Platelets: 157 10*3/uL (ref 145–400)
RBC: 2.95 10*6/uL — ABNORMAL LOW (ref 3.70–5.32)
RDW: 16.1 % — ABNORMAL HIGH (ref 11.1–15.7)
WBC: 5.7 10*3/uL (ref 3.9–10.0)

## 2017-04-29 LAB — COMPREHENSIVE METABOLIC PANEL
ALT: 16 U/L (ref 0–55)
AST: 19 U/L (ref 5–34)
Albumin: 3.4 g/dL — ABNORMAL LOW (ref 3.5–5.0)
Alkaline Phosphatase: 55 U/L (ref 40–150)
Anion Gap: 10 mEq/L (ref 3–11)
BUN: 17.7 mg/dL (ref 7.0–26.0)
CO2: 25 mEq/L (ref 22–29)
Calcium: 9.3 mg/dL (ref 8.4–10.4)
Chloride: 109 mEq/L (ref 98–109)
Creatinine: 0.8 mg/dL (ref 0.6–1.1)
EGFR: >60 mL/min/{1.73_m2} (ref >60)
Glucose: 119 mg/dl (ref 70–140)
Potassium: 4 mEq/L (ref 3.5–5.1)
Sodium: 144 mEq/L (ref 136–145)
Total Bilirubin: 0.43 mg/dL (ref 0.20–1.20)
Total Protein: 6.3 g/dL — ABNORMAL LOW (ref 6.4–8.3)

## 2017-04-29 LAB — FERRITIN: Ferritin: 478 ng/ml — ABNORMAL HIGH (ref 9–269)

## 2017-04-29 LAB — IRON AND TIBC
%SAT: 39 % (ref 21–57)
Iron: 109 ug/dL (ref 41–142)
TIBC: 279 ug/dL (ref 236–444)
UIBC: 170 ug/dL (ref 120–384)

## 2017-04-29 NOTE — Progress Notes (Signed)
Hematology and Oncology Follow Up Visit  Helen Hardy [id number] October 12, 1950 66 y.o. 04/29/2017   Principle Diagnosis:  1. 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 Sickle cell trait 3. Anemia of chronic disease 4. Iron deficiency anemia  Current Therapy:   IV Iron as indicated - last dose received in December 9924 Folic acid OTC PO daily   Interim History:  Helen Hardy is here today for follow-up. She is doing well and has no complaints at this time.  She had 2 cysts removed from her spine in August and did well with the procedure. She has had injections in the right shoulder which has helped tremendously with her mobility.  She denies fatigue. No fever, chills, n/v, cough,r ash, dizziness, SOB, chest pain, palpitations, abdominal pain or changes in bowel or bladder habits.  The neuropathy in her feet is unchanged. Puffiness in her ankles comes and goes and improves if she elevates her feet.  She has maintained a good appetite and is staying well hydrated. Her weight is stable.   ECOG Performance Status: 0 - Asymptomatic  Medications:  Allergies as of 04/29/2017      Reactions   Oxycodone Nausea And Vomiting      Medication List       Accurate as of 04/29/17 10:28 AM. Always use your most recent med list.          amLODipine 10 MG tablet Commonly known as:  NORVASC Take 10 mg by mouth daily.   estradiol 1 MG tablet Commonly known as:  ESTRACE Take 1 mg by mouth daily.   folic acid 268 MCG tablet Commonly known as:  FOLVITE Take 800 mcg by mouth daily. Takes two tablets daily   gabapentin 300 MG capsule Commonly known as:  NEURONTIN Take 300 mg by mouth every evening.   HYDROcodone-acetaminophen 5-325 MG tablet Commonly known as:  NORCO/VICODIN Take 1 tablet by mouth 3 (three) times daily as needed for moderate pain.   leflunomide 10 MG tablet Commonly known as:  ARAVA Take 10 mg by mouth daily.   methocarbamol 500 MG  tablet Commonly known as:  ROBAXIN Take 1 tablet (500 mg total) by mouth 3 (three) times daily as needed for muscle spasms.   multivitamin tablet Take 1 tablet by mouth daily.   ondansetron 4 MG tablet Commonly known as:  ZOFRAN Take 1 tablet (4 mg total) by mouth every 8 (eight) hours as needed for nausea or vomiting.   pantoprazole 40 MG tablet Commonly known as:  PROTONIX Take 40 mg by mouth 2 (two) times daily.   predniSONE 5 MG tablet Commonly known as:  DELTASONE Take 5 mg by mouth daily.   triamcinolone cream 0.1 % Commonly known as:  KENALOG Apply 1 application topically as needed (for rash).   valACYclovir 1000 MG tablet Commonly known as:  VALTREX Take 500 mg by mouth daily.   vitamin B-12 1000 MCG tablet Commonly known as:  CYANOCOBALAMIN Take 1,000 mcg by mouth daily.       Allergies:  Allergies  Allergen Reactions  . Oxycodone Nausea And Vomiting    Past Medical History, Surgical history, Social history, and Family History were reviewed and updated.  Review of Systems: All other 10 point review of systems is negative.   Physical Exam:  weight is 236 lb 8 oz (107.3 kg). Her oral temperature is 97.9 F (36.6 C). Her blood pressure is 151/76 (abnormal) and her pulse is 72. Her respiration is 20 and  oxygen saturation is 97%.   Wt Readings from Last 3 Encounters:  04/29/17 236 lb 8 oz (107.3 kg)  12/25/16 221 lb (100.2 kg)  09/18/16 225 lb (102.1 kg)    Ocular: Sclerae unicteric, pupils equal, round and reactive to light Ear-nose-throat: Oropharynx clear, dentition fair Lymphatic: No cervical, supraclavicular or axillary adenopathy Lungs no rales or rhonchi, good excursion bilaterally Heart regular rate and rhythm, no murmur appreciated Abd soft, nontender, positive bowel sounds, no liver or spleen tip palpated on exam, no fluid wave  MSK no focal spinal tenderness, no joint edema Neuro: non-focal, well-oriented, appropriate affect Breasts:  Deferred   Lab Results  Component Value Date   WBC 5.7 04/29/2017   HGB 10.9 (L) 04/29/2017   HCT 32.3 (L) 04/29/2017   MCV 110 (H) 04/29/2017   PLT 157 04/29/2017   Lab Results  Component Value Date   FERRITIN 608 (H) 12/25/2016   IRON 104 12/25/2016   TIBC 251 12/25/2016   UIBC 148 12/25/2016   IRONPCTSAT 41 12/25/2016   Lab Results  Component Value Date   RETICCTPCT 1.4 06/21/2015   RBC 2.95 (L) 04/29/2017   RETICCTABS 41.0 06/21/2015   No results found for: KPAFRELGTCHN, LAMBDASER, KAPLAMBRATIO No results found for: IGGSERUM, IGA, IGMSERUM No results found for: [name], A1GS, A2GS, Tillman Sers, SPEI   Chemistry      Component Value Date/Time   NA 144 12/25/2016 0902   K 3.9 12/25/2016 0902   CL 102 07/17/2016 0325   CL 103 06/21/2015 0800   CO2 25 12/25/2016 0902   BUN 15.1 12/25/2016 0902   CREATININE 0.8 12/25/2016 0902      Component Value Date/Time   CALCIUM 10.0 12/25/2016 0902   ALKPHOS 58 12/25/2016 0902   AST 22 12/25/2016 0902   ALT 16 12/25/2016 0902   BILITOT 0.48 12/25/2016 0902      Impression and Plan: Helen Hardy is a very pleasant 66 yo [place] American female with history of low grade non-Hodgkin's lymphoma and completed treatment over 14 years ago. She is doing well and has no complaints at this time.  We will see what her iron studies show and bring her back in next week for an infusion if needed.  We will plan to see her back in another 4 months for repeat lab work and follow-up.  She will contact our office with any questions or concerns. We can certainly see her sooner if need be.   Eliezer Bottom, NP 10/25/201810:28 AM

## 2017-04-30 DIAGNOSIS — M25571 Pain in right ankle and joints of right foot: Secondary | ICD-10-CM | POA: Diagnosis not present

## 2017-04-30 DIAGNOSIS — M65871 Other synovitis and tenosynovitis, right ankle and foot: Secondary | ICD-10-CM | POA: Diagnosis not present

## 2017-04-30 DIAGNOSIS — M65872 Other synovitis and tenosynovitis, left ankle and foot: Secondary | ICD-10-CM | POA: Diagnosis not present

## 2017-04-30 LAB — RETICULOCYTES: Reticulocyte Count: 2.8 % — ABNORMAL HIGH (ref 0.6–2.6)

## 2017-05-14 DIAGNOSIS — M65871 Other synovitis and tenosynovitis, right ankle and foot: Secondary | ICD-10-CM | POA: Diagnosis not present

## 2017-05-14 DIAGNOSIS — M25571 Pain in right ankle and joints of right foot: Secondary | ICD-10-CM | POA: Diagnosis not present

## 2017-05-17 DIAGNOSIS — M0579 Rheumatoid arthritis with rheumatoid factor of multiple sites without organ or systems involvement: Secondary | ICD-10-CM | POA: Diagnosis not present

## 2017-05-17 DIAGNOSIS — M0589 Other rheumatoid arthritis with rheumatoid factor of multiple sites: Secondary | ICD-10-CM | POA: Diagnosis not present

## 2017-05-31 ENCOUNTER — Other Ambulatory Visit: Payer: Self-pay | Admitting: Obstetrics and Gynecology

## 2017-05-31 DIAGNOSIS — Z1231 Encounter for screening mammogram for malignant neoplasm of breast: Secondary | ICD-10-CM

## 2017-06-03 DIAGNOSIS — M65871 Other synovitis and tenosynovitis, right ankle and foot: Secondary | ICD-10-CM | POA: Diagnosis not present

## 2017-06-03 DIAGNOSIS — M25571 Pain in right ankle and joints of right foot: Secondary | ICD-10-CM | POA: Diagnosis not present

## 2017-06-09 DIAGNOSIS — Z1389 Encounter for screening for other disorder: Secondary | ICD-10-CM | POA: Diagnosis not present

## 2017-06-09 DIAGNOSIS — Z130 Encounter for screening for diseases of the blood and blood-forming organs and certain disorders involving the immune mechanism: Secondary | ICD-10-CM | POA: Diagnosis not present

## 2017-06-09 DIAGNOSIS — Z01419 Encounter for gynecological examination (general) (routine) without abnormal findings: Secondary | ICD-10-CM | POA: Diagnosis not present

## 2017-06-09 DIAGNOSIS — Z79890 Hormone replacement therapy (postmenopausal): Secondary | ICD-10-CM | POA: Diagnosis not present

## 2017-06-09 DIAGNOSIS — A630 Anogenital (venereal) warts: Secondary | ICD-10-CM | POA: Diagnosis not present

## 2017-06-16 DIAGNOSIS — M0589 Other rheumatoid arthritis with rheumatoid factor of multiple sites: Secondary | ICD-10-CM | POA: Diagnosis not present

## 2017-06-18 DIAGNOSIS — M12271 Villonodular synovitis (pigmented), right ankle and foot: Secondary | ICD-10-CM | POA: Diagnosis not present

## 2017-07-09 ENCOUNTER — Ambulatory Visit
Admission: RE | Admit: 2017-07-09 | Discharge: 2017-07-09 | Disposition: A | Discharge status: Home / Self Care | Payer: Medicare Other | Source: Ambulatory Visit | Attending: Obstetrics and Gynecology | Admitting: Obstetrics and Gynecology

## 2017-07-09 DIAGNOSIS — Z1231 Encounter for screening mammogram for malignant neoplasm of breast: Secondary | ICD-10-CM | POA: Diagnosis not present

## 2017-07-12 ENCOUNTER — Other Ambulatory Visit: Payer: Self-pay | Admitting: Obstetrics and Gynecology

## 2017-07-12 DIAGNOSIS — R928 Other abnormal and inconclusive findings on diagnostic imaging of breast: Secondary | ICD-10-CM

## 2017-07-13 DIAGNOSIS — H02839 Dermatochalasis of unspecified eye, unspecified eyelid: Secondary | ICD-10-CM | POA: Diagnosis not present

## 2017-07-13 DIAGNOSIS — I10 Essential (primary) hypertension: Secondary | ICD-10-CM | POA: Diagnosis not present

## 2017-07-13 DIAGNOSIS — Z961 Presence of intraocular lens: Secondary | ICD-10-CM | POA: Diagnosis not present

## 2017-07-13 DIAGNOSIS — H18413 Arcus senilis, bilateral: Secondary | ICD-10-CM | POA: Diagnosis not present

## 2017-07-14 ENCOUNTER — Other Ambulatory Visit: Payer: Self-pay | Admitting: Obstetrics and Gynecology

## 2017-07-14 ENCOUNTER — Ambulatory Visit
Admission: RE | Admit: 2017-07-14 | Discharge: 2017-07-14 | Disposition: A | Discharge status: Home / Self Care | Payer: Medicare Other | Source: Ambulatory Visit | Attending: Obstetrics and Gynecology | Admitting: Obstetrics and Gynecology

## 2017-07-14 DIAGNOSIS — R928 Other abnormal and inconclusive findings on diagnostic imaging of breast: Secondary | ICD-10-CM

## 2017-07-14 DIAGNOSIS — R921 Mammographic calcification found on diagnostic imaging of breast: Secondary | ICD-10-CM

## 2017-07-15 DIAGNOSIS — M0589 Other rheumatoid arthritis with rheumatoid factor of multiple sites: Secondary | ICD-10-CM | POA: Diagnosis not present

## 2017-07-16 ENCOUNTER — Ambulatory Visit
Admission: RE | Admit: 2017-07-16 | Discharge: 2017-07-16 | Disposition: A | Discharge status: Home / Self Care | Payer: Medicare Other | Source: Ambulatory Visit | Attending: Obstetrics and Gynecology | Admitting: Obstetrics and Gynecology

## 2017-07-16 DIAGNOSIS — R921 Mammographic calcification found on diagnostic imaging of breast: Secondary | ICD-10-CM

## 2017-07-16 DIAGNOSIS — N6022 Fibroadenosis of left breast: Secondary | ICD-10-CM | POA: Diagnosis not present

## 2017-08-12 DIAGNOSIS — M0589 Other rheumatoid arthritis with rheumatoid factor of multiple sites: Secondary | ICD-10-CM | POA: Diagnosis not present

## 2017-08-12 DIAGNOSIS — M0579 Rheumatoid arthritis with rheumatoid factor of multiple sites without organ or systems involvement: Secondary | ICD-10-CM | POA: Diagnosis not present

## 2017-08-17 ENCOUNTER — Emergency Department (HOSPITAL_COMMUNITY): Payer: Medicare Other

## 2017-08-17 ENCOUNTER — Encounter (HOSPITAL_COMMUNITY): Payer: Self-pay

## 2017-08-17 ENCOUNTER — Other Ambulatory Visit: Payer: Self-pay

## 2017-08-17 ENCOUNTER — Emergency Department (HOSPITAL_COMMUNITY)
Admission: EM | Admit: 2017-08-17 | Discharge: 2017-08-17 | Disposition: A | Discharge status: Home / Self Care | Payer: Medicare Other | Attending: Emergency Medicine | Admitting: Emergency Medicine

## 2017-08-17 DIAGNOSIS — R197 Diarrhea, unspecified: Secondary | ICD-10-CM | POA: Insufficient documentation

## 2017-08-17 DIAGNOSIS — Z8572 Personal history of non-Hodgkin lymphomas: Secondary | ICD-10-CM | POA: Insufficient documentation

## 2017-08-17 DIAGNOSIS — R109 Unspecified abdominal pain: Secondary | ICD-10-CM | POA: Diagnosis not present

## 2017-08-17 DIAGNOSIS — R1084 Generalized abdominal pain: Secondary | ICD-10-CM | POA: Insufficient documentation

## 2017-08-17 DIAGNOSIS — Z79899 Other long term (current) drug therapy: Secondary | ICD-10-CM | POA: Insufficient documentation

## 2017-08-17 DIAGNOSIS — Z96653 Presence of artificial knee joint, bilateral: Secondary | ICD-10-CM | POA: Insufficient documentation

## 2017-08-17 DIAGNOSIS — E860 Dehydration: Secondary | ICD-10-CM | POA: Diagnosis not present

## 2017-08-17 DIAGNOSIS — M069 Rheumatoid arthritis, unspecified: Secondary | ICD-10-CM | POA: Insufficient documentation

## 2017-08-17 LAB — COMPREHENSIVE METABOLIC PANEL
ALT: 18 U/L (ref 14–54)
AST: 22 U/L (ref 15–41)
Albumin: 3.7 g/dL (ref 3.5–5.0)
Alkaline Phosphatase: 51 U/L (ref 38–126)
Anion gap: 12 (ref 5–15)
BUN: 12 mg/dL (ref 6–20)
CO2: 23 mmol/L (ref 22–32)
Calcium: 9.1 mg/dL (ref 8.9–10.3)
Chloride: 107 mmol/L (ref 101–111)
Creatinine, Ser: 0.77 mg/dL (ref 0.44–1.00)
GFR calc Af Amer: >60 mL/min (ref >60)
GFR calc non Af Amer: >60 mL/min (ref >60)
Glucose, Bld: 112 mg/dL — ABNORMAL HIGH (ref 65–99)
Potassium: 3.5 mmol/L (ref 3.5–5.1)
Sodium: 142 mmol/L (ref 135–145)
Total Bilirubin: 0.8 mg/dL (ref 0.3–1.2)
Total Protein: 7 g/dL (ref 6.5–8.1)

## 2017-08-17 LAB — URINALYSIS, ROUTINE W REFLEX MICROSCOPIC
Bilirubin Urine: NEGATIVE
Glucose, UA: NEGATIVE mg/dL
Hgb urine dipstick: NEGATIVE
Ketones, ur: NEGATIVE mg/dL
Leukocytes, UA: NEGATIVE
Nitrite: NEGATIVE
Protein, ur: NEGATIVE mg/dL
Specific Gravity, Urine: >1.046 — ABNORMAL HIGH (ref 1.005–1.030)
pH: 5 (ref 5.0–8.0)

## 2017-08-17 LAB — CBC
HCT: 34.8 % — ABNORMAL LOW (ref 36.0–46.0)
Hemoglobin: 11.9 g/dL — ABNORMAL LOW (ref 12.0–15.0)
MCH: 36.7 pg — ABNORMAL HIGH (ref 26.0–34.0)
MCHC: 34.2 g/dL (ref 30.0–36.0)
MCV: 107.4 fL — ABNORMAL HIGH (ref 78.0–100.0)
Platelets: 175 10*3/uL (ref 150–400)
RBC: 3.24 MIL/uL — ABNORMAL LOW (ref 3.87–5.11)
RDW: 15.9 % — ABNORMAL HIGH (ref 11.5–15.5)
WBC: 5.7 10*3/uL (ref 4.0–10.5)

## 2017-08-17 LAB — LIPASE, BLOOD: Lipase: 18 U/L (ref 11–51)

## 2017-08-17 MED ORDER — IOPAMIDOL (ISOVUE-300) INJECTION 61%
100.0000 mL | Freq: Once | INTRAVENOUS | Status: AC | PRN
  Administered 2017-08-17: 100 mL via INTRAVENOUS

## 2017-08-17 MED ORDER — IOPAMIDOL (ISOVUE-300) INJECTION 61%
INTRAVENOUS | Status: AC
  Filled 2017-08-17: qty 100

## 2017-08-17 MED ORDER — CIPROFLOXACIN HCL 500 MG PO TABS
500.0000 mg | ORAL_TABLET | Freq: Once | ORAL | Status: AC
  Administered 2017-08-17: 500 mg via ORAL
  Filled 2017-08-17: qty 1

## 2017-08-17 MED ORDER — CIPROFLOXACIN HCL 500 MG PO TABS: 500.0000 mg | ORAL_TABLET | Freq: Two times a day (BID) | ORAL | 0 refills | Status: DC

## 2017-08-17 MED ORDER — METRONIDAZOLE 500 MG PO TABS
500.0000 mg | ORAL_TABLET | Freq: Once | ORAL | Status: AC
  Administered 2017-08-17: 500 mg via ORAL
  Filled 2017-08-17: qty 1

## 2017-08-17 MED ORDER — MORPHINE SULFATE (PF) 4 MG/ML IV SOLN
4.0000 mg | Freq: Once | INTRAVENOUS | Status: AC
  Administered 2017-08-17: 4 mg via INTRAVENOUS
  Filled 2017-08-17: qty 1

## 2017-08-17 MED ORDER — SODIUM CHLORIDE 0.9 % IV BOLUS (SEPSIS)
1000.0000 mL | Freq: Once | INTRAVENOUS | Status: AC
  Administered 2017-08-17: 1000 mL via INTRAVENOUS

## 2017-08-17 MED ORDER — ONDANSETRON HCL 4 MG/2ML IJ SOLN
4.0000 mg | Freq: Once | INTRAMUSCULAR | Status: AC
  Administered 2017-08-17: 4 mg via INTRAVENOUS
  Filled 2017-08-17: qty 2

## 2017-08-17 MED ORDER — DIAZEPAM 5 MG PO TABS: 5.0000 mg | ORAL_TABLET | Freq: Two times a day (BID) | ORAL | 0 refills | Status: DC

## 2017-08-17 MED ORDER — METRONIDAZOLE 500 MG PO TABS
500.0000 mg | ORAL_TABLET | Freq: Two times a day (BID) | ORAL | 0 refills | Status: DC
Start: 2017-08-17 — End: 2018-04-23

## 2017-08-17 MED ORDER — ONDANSETRON 4 MG PO TBDP: 4.0000 mg | ORAL_TABLET | Freq: Three times a day (TID) | ORAL | 0 refills | Status: DC | PRN

## 2017-08-17 NOTE — ED Triage Notes (Signed)
Patient reports that she has had n/V/D and abdominal cramping x 2 days. patient has been taking OTC anti-diarrheal. Patient states she last used this AM.

## 2017-08-17 NOTE — ED Notes (Signed)
Patient in CT, gave visitor ice water

## 2017-08-17 NOTE — ED Provider Notes (Signed)
[facility] DEPT Provider Note   CSN: [id number] Arrival date & time: 08/17/17  6789     History   Chief Complaint Chief Complaint  Patient presents with  . Diarrhea  . Abdominal Pain  . Nausea    HPI Helen Hardy is a 67 y.o. female.  Pt presents to the ED today with diarrhea and abdominal pain.  The pt said sx have been going on for 2 days.  She has been taking imodium without relief in sx.  Pt said she has not had any recent travel or abx, but she is on [place] which is an immunosuppressive med for RA.      Past Medical History:  Diagnosis Date  . Anemia   . Arthritis    Rheumatoid  . Cancer ([place])    Non-Hodgkins Lymphoma  . GERD (gastroesophageal reflux disease)   . Hypertension   . 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 Numbness and tingling of right leg   . Pneumonia     Patient Active Problem List   Diagnosis Date Noted  . 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 Iron deficiency anemia 06/21/2015  . 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 Morbid obesity ([place]) 04/10/2015  . 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 S/P knee replacement 04/08/2015    Past Surgical History:  Procedure Laterality Date  . 2012 Right big toe and toe beside - had pins in toes    . ABDOMINAL HYSTERECTOMY    . bladder tack - 2005    . EYE SURGERY Bilateral    cataract  . JOINT REPLACEMENT     both knee replacement  . LUMBAR LAMINECTOMY/DECOMPRESSION MICRODISCECTOMY N/A 03/04/2016   Procedure: RIGHT FAR LATERAL DISCECTOMY L5-S1;  Surgeon: Melina Schools, MD;  Location: [facility];  Service: Orthopedics;  Laterality: N/A;  . ROTATOR CUFF REPAIR Left   . TOTAL KNEE REVISION Left 04/08/2015   Procedure: REVISION LEFT TOTAL KNEE ;  Surgeon: Paralee Cancel, MD;  Location: [facility];  Service: Orthopedics;  Laterality: Left;    OB History    No data available       Home Medications    Prior to Admission medications   Medication Sig Start Date End Date Taking?  Authorizing Provider  abatacept (ORENCIA) 250 MG injection Inject 250 mg into the vein every 28 (twenty-eight) days.   Yes [provider]  amLODipine (NORVASC) 10 MG tablet Take 10 mg by mouth daily.   Yes [provider]  estradiol (ESTRACE) 1 MG tablet Take 1 mg by mouth daily.  12/25/12  Yes [provider]  folic acid (FOLVITE) 381 MCG tablet Take 800 mcg by mouth daily. Takes two tablets daily   Yes [provider]  HYDROcodone-acetaminophen (NORCO/VICODIN) 5-325 MG tablet Take 1 tablet by mouth 3 (three) times daily as needed for moderate pain.   Yes [provider]  leflunomide (ARAVA) 10 MG tablet Take 20 mg by mouth daily.  06/18/15  Yes [provider]  Multiple Vitamin (MULTIVITAMIN) tablet Take 1 tablet by mouth daily.   Yes [provider]  ondansetron (ZOFRAN) 4 MG tablet Take 1 tablet (4 mg total) by mouth every 8 (eight) hours as needed for nausea or vomiting. 03/04/16  Yes Melina Schools, MD  pantoprazole (PROTONIX) 40 MG tablet Take 40 mg by mouth 2 (two) times daily.    Yes [provider]  predniSONE (DELTASONE) 5 MG tablet Take 5 mg by mouth daily.  10/29/14  Yes  [provider]  triamcinolone cream (KENALOG) 0.1 % Apply 1 application topically as needed (for rash).    Yes [provider]  vitamin B-12 (CYANOCOBALAMIN) 1000 MCG tablet Take 1,000 mcg by mouth daily.   Yes [provider]  ciprofloxacin (CIPRO) 500 MG tablet Take 1 tablet (500 mg total) by mouth 2 (two) times daily. 08/17/17   Isla Pence, MD  diazepam (VALIUM) 5 MG tablet Take 1 tablet (5 mg total) by mouth 2 (two) times daily. 08/17/17   Isla Pence, MD  metroNIDAZOLE (FLAGYL) 500 MG tablet Take 1 tablet (500 mg total) by mouth 2 (two) times daily. 08/17/17   Isla Pence, MD  ondansetron (ZOFRAN ODT) 4 MG disintegrating tablet Take 1 tablet (4 mg total) by mouth every 8 (eight) hours as needed. 08/17/17    Isla Pence, MD    Family History History reviewed. No pertinent family history.  Social History Social History   Tobacco Use  . Smoking status: Never Smoker  . Smokeless tobacco: Never Used  Substance Use Topics  . Alcohol use: No    Alcohol/week: 0.0 oz  . Drug use: No     Allergies   Oxycodone   Review of Systems Review of Systems  Gastrointestinal: Positive for abdominal pain, diarrhea, nausea and vomiting.  All other systems reviewed and are negative.    Physical Exam Updated Vital Signs BP (!) 142/75   Pulse 73   Temp 97.7 F (36.5 C) (Oral)   Resp 18   Ht 5' (1.524 m)   Wt 105.2 kg (232 lb)   SpO2 98%   BMI 45.31 kg/m   Physical Exam  Constitutional: She appears well-developed and well-nourished.  HENT:  Head: Normocephalic and atraumatic.  Mouth/Throat: Mucous membranes are dry.  Eyes: EOM are normal. Pupils are equal, round, and reactive to light.  Cardiovascular: Regular rhythm and normal heart sounds. Tachycardia present.  Pulmonary/Chest: Effort normal and breath sounds normal.  Abdominal: Soft. Normal appearance and bowel sounds are normal. There is generalized tenderness.  Skin: Skin is warm. Capillary refill takes less than 2 seconds.  Nursing note and vitals reviewed.    ED Treatments / Results  Labs (all labs ordered are listed, but only abnormal results are displayed) Labs Reviewed  COMPREHENSIVE METABOLIC PANEL - Abnormal; Notable for the following components:      Result Value   Glucose, Bld 112 (*)    All other components within normal limits  CBC - Abnormal; Notable for the following components:   RBC 3.24 (*)    Hemoglobin 11.9 (*)    HCT 34.8 (*)    MCV 107.4 (*)    MCH 36.7 (*)    RDW 15.9 (*)    All other components within normal limits  URINALYSIS, ROUTINE W REFLEX MICROSCOPIC - Abnormal; Notable for the following components:   Specific Gravity, Urine >1.046 (*)    All other components within normal limits  C  DIFFICILE QUICK SCREEN W PCR REFLEX  GASTROINTESTINAL PANEL BY PCR, STOOL (REPLACES STOOL CULTURE)  LIPASE, BLOOD    EKG  EKG Interpretation None       Radiology Ct Abdomen Pelvis W Contrast  Result Date: 08/17/2017 CLINICAL DATA:  Nausea vomiting and diarrhea. Abdominal cramping for 2 days. Remote history of non-Hodgkin's lymphoma. EXAM: CT ABDOMEN AND PELVIS WITH CONTRAST TECHNIQUE: Multidetector CT imaging of the abdomen and pelvis was performed using the standard protocol following bolus administration of intravenous contrast. CONTRAST:  163m ISOVUE-300 IOPAMIDOL (ISOVUE-300) INJECTION  61% COMPARISON:  12/01/2007 FINDINGS: Lower chest: Bibasilar scarring. Borderline cardiomegaly, without pericardial or pleural effusion. Hepatobiliary: Normal liver. Normal gallbladder. The common duct measures 11 mm on image 29/series 2 versus 8 mm on the prior. Intrahepatic ducts are borderline prominent. Pancreas: Normal, without mass or ductal dilatation. Spleen: Normal in size, without focal abnormality. Adrenals/Urinary Tract: Normal adrenal glands. Normal kidneys, without hydronephrosis. Normal urinary bladder. Stomach/Bowel: Normal stomach, without wall thickening. Scattered colonic diverticula. Normal terminal ileum and appendix. Normal small bowel. Vascular/Lymphatic: Normal caliber of the aorta and branch vessels. No abdominopelvic adenopathy. Reproductive: Hysterectomy.  No adnexal mass. Other: No significant free fluid. Mild pelvic floor laxity. Fat containing left inguinal hernia. Musculoskeletal: Lumbosacral spondylosis, including degenerate disc disease at L4-5 and L5-S1. IMPRESSION: 1.  No acute process in the abdomen or pelvis. 2. Development of common duct dilatation since 2009. Correlate with bilirubin levels. If these are elevated, consider ERCP or MRCP. 3. No abdominopelvic adenopathy to suggest recurrent lymphoma. Electronically Signed   By: Abigail Miyamoto M.D.   On: 08/17/2017 12:47     Procedures Procedures (including critical care time)  Medications Ordered in ED Medications  iopamidol (ISOVUE-300) 61 % injection (not administered)  ciprofloxacin (CIPRO) tablet 500 mg (not administered)  metroNIDAZOLE (FLAGYL) tablet 500 mg (not administered)  sodium chloride 0.9 % bolus 1,000 mL (0 mLs Intravenous Stopped 08/17/17 1304)  ondansetron (ZOFRAN) injection 4 mg (4 mg Intravenous Given 08/17/17 1104)  morphine 4 MG/ML injection 4 mg (4 mg Intravenous Given 08/17/17 1104)  iopamidol (ISOVUE-300) 61 % injection 100 mL (100 mLs Intravenous Contrast Given 08/17/17 1219)     Initial Impression / Assessment and Plan / ED Course  I have reviewed the triage vital signs and the nursing notes.  Pertinent labs & imaging results that were available during my care of the patient were reviewed by me and considered in my medical decision making (see chart for details).  Pt is feeling better.  She has tolerated po fluids.  Sx started after eating at a restaurant, so I will start her on cipro/flagyl for sx.  She knows to return if worse.  F/u with pcp.  Final Clinical Impressions(s) / ED Diagnoses   Final diagnoses:  Diarrhea, unspecified type  Dehydration    ED Discharge Orders        Ordered    ciprofloxacin (CIPRO) 500 MG tablet  2 times daily     08/17/17 1358    metroNIDAZOLE (FLAGYL) 500 MG tablet  2 times daily     08/17/17 1358    ondansetron (ZOFRAN ODT) 4 MG disintegrating tablet  Every 8 hours PRN     08/17/17 1358    diazepam (VALIUM) 5 MG tablet  2 times daily     08/17/17 1358       Isla Pence, MD 08/17/17 1404

## 2017-08-21 ENCOUNTER — Emergency Department (HOSPITAL_COMMUNITY)
Admission: EM | Admit: 2017-08-21 | Discharge: 2017-08-21 | Disposition: A | Discharge status: Home / Self Care | Payer: Medicare Other | Attending: Emergency Medicine | Admitting: Emergency Medicine

## 2017-08-21 ENCOUNTER — Encounter (HOSPITAL_COMMUNITY): Payer: Self-pay | Admitting: Emergency Medicine

## 2017-08-21 DIAGNOSIS — Z79899 Other long term (current) drug therapy: Secondary | ICD-10-CM | POA: Insufficient documentation

## 2017-08-21 DIAGNOSIS — R197 Diarrhea, unspecified: Secondary | ICD-10-CM | POA: Diagnosis not present

## 2017-08-21 DIAGNOSIS — Z8572 Personal history of non-Hodgkin lymphomas: Secondary | ICD-10-CM | POA: Diagnosis not present

## 2017-08-21 DIAGNOSIS — R110 Nausea: Secondary | ICD-10-CM | POA: Diagnosis not present

## 2017-08-21 LAB — COMPREHENSIVE METABOLIC PANEL
ALT: 53 U/L (ref 14–54)
AST: 84 U/L — ABNORMAL HIGH (ref 15–41)
Albumin: 4 g/dL (ref 3.5–5.0)
Alkaline Phosphatase: 49 U/L (ref 38–126)
Anion gap: 9 (ref 5–15)
BUN: 14 mg/dL (ref 6–20)
CO2: 22 mmol/L (ref 22–32)
Calcium: 8.9 mg/dL (ref 8.9–10.3)
Chloride: 111 mmol/L (ref 101–111)
Creatinine, Ser: 0.82 mg/dL (ref 0.44–1.00)
GFR calc Af Amer: >60 mL/min (ref >60)
GFR calc non Af Amer: >60 mL/min (ref >60)
Glucose, Bld: 100 mg/dL — ABNORMAL HIGH (ref 65–99)
Potassium: 3.6 mmol/L (ref 3.5–5.1)
Sodium: 142 mmol/L (ref 135–145)
Total Bilirubin: 0.4 mg/dL (ref 0.3–1.2)
Total Protein: 6.6 g/dL (ref 6.5–8.1)

## 2017-08-21 LAB — CBC WITH DIFFERENTIAL/PLATELET
Basophils Absolute: 0 10*3/uL (ref 0.0–0.1)
Basophils Relative: 0 %
Eosinophils Absolute: 0 10*3/uL (ref 0.0–0.7)
Eosinophils Relative: 0 %
HCT: 35.5 % — ABNORMAL LOW (ref 36.0–46.0)
Hemoglobin: 11.9 g/dL — ABNORMAL LOW (ref 12.0–15.0)
Lymphocytes Relative: 33 %
Lymphs Abs: 1.9 10*3/uL (ref 0.7–4.0)
MCH: 36 pg — ABNORMAL HIGH (ref 26.0–34.0)
MCHC: 33.5 g/dL (ref 30.0–36.0)
MCV: 107.3 fL — ABNORMAL HIGH (ref 78.0–100.0)
Monocytes Absolute: 0.7 10*3/uL (ref 0.1–1.0)
Monocytes Relative: 13 %
Neutro Abs: 3 10*3/uL (ref 1.7–7.7)
Neutrophils Relative %: 54 %
Platelets: 176 10*3/uL (ref 150–400)
RBC: 3.31 MIL/uL — ABNORMAL LOW (ref 3.87–5.11)
RDW: 15.7 % — ABNORMAL HIGH (ref 11.5–15.5)
WBC: 5.7 10*3/uL (ref 4.0–10.5)

## 2017-08-21 LAB — LIPASE, BLOOD: Lipase: 44 U/L (ref 11–51)

## 2017-08-21 MED ORDER — SODIUM CHLORIDE 0.9 % IV SOLN
INTRAVENOUS | Status: DC
  Administered 2017-08-21: 09:00:00 via INTRAVENOUS

## 2017-08-21 MED ORDER — LOPERAMIDE HCL 2 MG PO TABS: 2.0000 mg | ORAL_TABLET | Freq: Four times a day (QID) | ORAL | 0 refills | Status: DC | PRN

## 2017-08-21 MED ORDER — ONDANSETRON HCL 4 MG/2ML IJ SOLN
4.0000 mg | Freq: Once | INTRAMUSCULAR | Status: AC
  Administered 2017-08-21: 4 mg via INTRAVENOUS
  Filled 2017-08-21: qty 2

## 2017-08-21 MED ORDER — SODIUM CHLORIDE 0.9 % IV BOLUS (SEPSIS)
1000.0000 mL | Freq: Once | INTRAVENOUS | Status: AC
Start: 2017-08-21 — End: 2017-08-21
  Administered 2017-08-21: 1000 mL via INTRAVENOUS

## 2017-08-21 MED ORDER — ONDANSETRON 4 MG PO TBDP: 4.0000 mg | ORAL_TABLET | Freq: Three times a day (TID) | ORAL | 2 refills | Status: DC | PRN

## 2017-08-21 NOTE — ED Triage Notes (Addendum)
Patient here from home with complaints of diarrhea since Sunday. Reports that she was here on 2/12 for same. NO relief. States that she has been eating a bland diet with no relief. Pain with diarrhea only abdomen cramping.

## 2017-08-21 NOTE — Discharge Instructions (Signed)
Labs today look good no significant dehydration.  It is important that you eat bland food frequently in small amounts and hydrate yourself.  Take the Zofran to keep the nausea under control.  An additional prescription for Imodium right ear provided as needed.  Make an appointment to follow-up with your doctors in the next few days.  Return for any new or worse symptoms.

## 2017-08-21 NOTE — ED Provider Notes (Signed)
[facility] DEPT Provider Note   CSN: [id number] Arrival date & time: 08/21/17  9767     History   Chief Complaint Chief Complaint  Patient presents with  . Diarrhea    HPI Helen Hardy is a 67 y.o. female.  Patient with history of a watery diarrheal illness now for 7 days.  Seen January 12 had CT scan of the abdomen without any acute findings also had lab work without any significant abnormalities.  Patient states she still continuing to have frequent watery bowel movements.  Denies any blood.  Patient was also treated with Cipro on January 12 is made no difference.  She is continuing to take Zofran and Imodium right ear.  No true vomiting patient has had nausea but patient has chronic nausea.  Patient is also on chronic pain medicines.  Denies any fevers.  Denies being lightheaded or feeling like she is going to pass out.      Past Medical History:  Diagnosis Date  . Anemia   . Arthritis    Rheumatoid  . Cancer ([place])    Non-Hodgkins Lymphoma  . GERD (gastroesophageal reflux disease)   . Hypertension   . 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 Numbness and tingling of right leg   . Pneumonia     Patient Active Problem List   Diagnosis Date Noted  . 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 Iron deficiency anemia 06/21/2015  . 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 Morbid obesity ([place]) 04/10/2015  . 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 S/P knee replacement 04/08/2015    Past Surgical History:  Procedure Laterality Date  . 2012 Right big toe and toe beside - had pins in toes    . ABDOMINAL HYSTERECTOMY    . bladder tack - 2005    . EYE SURGERY Bilateral    cataract  . JOINT REPLACEMENT     both knee replacement  . LUMBAR LAMINECTOMY/DECOMPRESSION MICRODISCECTOMY N/A 03/04/2016   Procedure: RIGHT FAR LATERAL DISCECTOMY L5-S1;  Surgeon: Melina Schools, MD;  Location: [facility];  Service: Orthopedics;  Laterality: N/A;  . ROTATOR CUFF REPAIR  Left   . TOTAL KNEE REVISION Left 04/08/2015   Procedure: REVISION LEFT TOTAL KNEE ;  Surgeon: Paralee Cancel, MD;  Location: [facility];  Service: Orthopedics;  Laterality: Left;    OB History    No data available       Home Medications    Prior to Admission medications   Medication Sig Start Date End Date Taking? Authorizing Provider  abatacept (ORENCIA) 250 MG injection Inject 250 mg into the vein every 28 (twenty-eight) days.   Yes [provider]  amLODipine (NORVASC) 10 MG tablet Take 10 mg by mouth daily.   Yes [provider]  ciprofloxacin (CIPRO) 500 MG tablet Take 1 tablet (500 mg total) by mouth 2 (two) times daily. 08/17/17  Yes Isla Pence, MD  diazepam (VALIUM) 5 MG tablet Take 1 tablet (5 mg total) by mouth 2 (two) times daily. 08/17/17  Yes Isla Pence, MD  estradiol (ESTRACE) 1 MG tablet Take 1 mg by mouth daily.  12/25/12  Yes [provider]  folic acid (FOLVITE) 341 MCG tablet Take 800 mcg by mouth daily.    Yes [provider]  HYDROcodone-acetaminophen (NORCO/VICODIN) 5-325 MG tablet Take 1 tablet by mouth 3 (three) times daily as needed for moderate pain.   Yes [provider]  leflunomide (ARAVA) 20 MG tablet Take 20 mg by  mouth daily.   Yes [provider]  metroNIDAZOLE (FLAGYL) 500 MG tablet Take 1 tablet (500 mg total) by mouth 2 (two) times daily. 08/17/17  Yes Isla Pence, MD  Multiple Vitamin (MULTIVITAMIN) tablet Take 1 tablet by mouth daily.   Yes [provider]  ondansetron (ZOFRAN ODT) 4 MG disintegrating tablet Take 1 tablet (4 mg total) by mouth every 8 (eight) hours as needed. 08/17/17  Yes Isla Pence, MD  ondansetron (ZOFRAN) 4 MG tablet Take 1 tablet (4 mg total) by mouth every 8 (eight) hours as needed for nausea or vomiting. 03/04/16  Yes Melina Schools, MD  pantoprazole (PROTONIX) 40 MG tablet Take 40 mg by mouth 2 (two) times daily.    Yes [provider]  predniSONE  (DELTASONE) 5 MG tablet Take 5 mg by mouth daily.  10/29/14  Yes [provider]  triamcinolone cream (KENALOG) 0.1 % Apply 1 application topically as needed (for rash).    Yes [provider]  vitamin B-12 (CYANOCOBALAMIN) 1000 MCG tablet Take 1,000 mcg by mouth daily.   Yes [provider]  loperamide (IMODIUM A-D) 2 MG tablet Take 1 tablet (2 mg total) by mouth 4 (four) times daily as needed for diarrhea or loose stools. 08/21/17   Fredia Sorrow, MD  ondansetron (ZOFRAN ODT) 4 MG disintegrating tablet Take 1 tablet (4 mg total) by mouth every 8 (eight) hours as needed for nausea or vomiting. 08/21/17   Fredia Sorrow, MD    Family History No family history on file.  Social History Social History   Tobacco Use  . Smoking status: Never Smoker  . Smokeless tobacco: Never Used  Substance Use Topics  . Alcohol use: No    Alcohol/week: 0.0 oz  . Drug use: No     Allergies   Oxycodone   Review of Systems Review of Systems  Constitutional: Negative for fever.  HENT: Negative for congestion.   Eyes: Negative for redness.  Respiratory: Negative for shortness of breath.   Cardiovascular: Negative for chest pain.  Gastrointestinal: Positive for diarrhea and nausea. Negative for abdominal pain.  Musculoskeletal: Negative for back pain.  Skin: Negative for rash.  Neurological: Negative for headaches.  Hematological: Does not bruise/bleed easily.  Psychiatric/Behavioral: Negative for confusion.     Physical Exam Updated Vital Signs BP (!) 163/108 (BP Location: Left Arm)   Pulse (!) 101   Temp 97.8 F (36.6 C) (Oral)   Resp 18   SpO2 100%   Physical Exam  Constitutional: She is oriented to person, place, and time. She appears well-developed and well-nourished. No distress.  HENT:  Mucous membranes dry.  Eyes: Conjunctivae and EOM are normal. Pupils are equal, round, and reactive to light.  Neck: Normal range of motion. Neck supple.    Cardiovascular: Normal rate, regular rhythm and normal heart sounds.  Pulmonary/Chest: Effort normal and breath sounds normal. No respiratory distress.  Abdominal: Soft. Bowel sounds are normal. There is no tenderness.  Neurological: She is alert and oriented to person, place, and time. No cranial nerve deficit or sensory deficit. She exhibits normal muscle tone. Coordination normal.  Skin: Skin is warm.  Nursing note and vitals reviewed.    ED Treatments / Results  Labs (all labs ordered are listed, but only abnormal results are displayed) Labs Reviewed  CBC WITH DIFFERENTIAL/PLATELET - Abnormal; Notable for the following components:      Result Value   RBC 3.31 (*)    Hemoglobin 11.9 (*)  HCT 35.5 (*)    MCV 107.3 (*)    MCH 36.0 (*)    RDW 15.7 (*)    All other components within normal limits  COMPREHENSIVE METABOLIC PANEL - Abnormal; Notable for the following components:   Glucose, Bld 100 (*)    AST 84 (*)    All other components within normal limits  LIPASE, BLOOD    EKG  EKG Interpretation None       Radiology No results found.  Procedures Procedures (including critical care time)  Medications Ordered in ED Medications  0.9 %  sodium chloride infusion ( Intravenous New Bag/Given 08/21/17 0857)  ondansetron (ZOFRAN) injection 4 mg (not administered)  sodium chloride 0.9 % bolus 1,000 mL (1,000 mLs Intravenous New Bag/Given 08/21/17 0857)     Initial Impression / Assessment and Plan / ED Course  I have reviewed the triage vital signs and the nursing notes.  Pertinent labs & imaging results that were available during my care of the patient were reviewed by me and considered in my medical decision making (see chart for details).     Patient clinically dehydrated.  No abdominal tenderness.  Patient without any real vomiting.  Just sounds like as she does not want to eat think she got her instructions a little confused.  Patient did not improve on Cipro so  antibiotics did not help the situation.  Patient 7 days into the illness no bleeding from the GI tract.  Repeat labs here today without any significant abnormalities that look very good including liver function test.  Previous CT raise some question about some's dilatation of the common bile duct.  But no evidence of any biliary obstruction on today's labs.  Patient received 1 L of normal saline and a maintenance rate of 100 cc an hour she is feeling better.  Patient also given IV Zofran.  Will renew patient's Zofran for home and I will renew her Imodium right ear.  Patient given ODT Zofran.  She has primary care doctor to follow-up with.  Patient encouraged to eat small meals frequently and hydrate herself.  Avoid dairy products and spicy food.  Patient instructed that when she eats the diarrhea will increase.  But her body will absorb some of the nutrients.  Final Clinical Impressions(s) / ED Diagnoses   Final diagnoses:  Diarrhea, unspecified type    ED Discharge Orders        Ordered    ondansetron (ZOFRAN ODT) 4 MG disintegrating tablet  Every 8 hours PRN     08/21/17 1014    loperamide (IMODIUM A-D) 2 MG tablet  4 times daily PRN     08/21/17 1014       Fredia Sorrow, MD 08/21/17 1020

## 2017-08-26 DIAGNOSIS — I10 Essential (primary) hypertension: Secondary | ICD-10-CM | POA: Diagnosis not present

## 2017-08-26 DIAGNOSIS — D649 Anemia, unspecified: Secondary | ICD-10-CM | POA: Diagnosis not present

## 2017-08-26 DIAGNOSIS — E559 Vitamin D deficiency, unspecified: Secondary | ICD-10-CM | POA: Diagnosis not present

## 2017-08-26 DIAGNOSIS — N390 Urinary tract infection, site not specified: Secondary | ICD-10-CM | POA: Diagnosis not present

## 2017-08-31 DIAGNOSIS — D649 Anemia, unspecified: Secondary | ICD-10-CM | POA: Diagnosis not present

## 2017-08-31 DIAGNOSIS — Z96653 Presence of artificial knee joint, bilateral: Secondary | ICD-10-CM | POA: Diagnosis not present

## 2017-08-31 DIAGNOSIS — Z79890 Hormone replacement therapy (postmenopausal): Secondary | ICD-10-CM | POA: Diagnosis not present

## 2017-08-31 DIAGNOSIS — I10 Essential (primary) hypertension: Secondary | ICD-10-CM | POA: Diagnosis not present

## 2017-08-31 DIAGNOSIS — M069 Rheumatoid arthritis, unspecified: Secondary | ICD-10-CM | POA: Diagnosis not present

## 2017-08-31 DIAGNOSIS — R7309 Other abnormal glucose: Secondary | ICD-10-CM | POA: Diagnosis not present

## 2017-08-31 DIAGNOSIS — G629 Polyneuropathy, unspecified: Secondary | ICD-10-CM | POA: Diagnosis not present

## 2017-08-31 DIAGNOSIS — Z0001 Encounter for general adult medical examination with abnormal findings: Secondary | ICD-10-CM | POA: Diagnosis not present

## 2017-08-31 DIAGNOSIS — Z79899 Other long term (current) drug therapy: Secondary | ICD-10-CM | POA: Diagnosis not present

## 2017-08-31 DIAGNOSIS — D573 Sickle-cell trait: Secondary | ICD-10-CM | POA: Diagnosis not present

## 2017-08-31 DIAGNOSIS — K219 Gastro-esophageal reflux disease without esophagitis: Secondary | ICD-10-CM | POA: Diagnosis not present

## 2017-08-31 DIAGNOSIS — L80 Vitiligo: Secondary | ICD-10-CM | POA: Diagnosis not present

## 2017-09-02 ENCOUNTER — Inpatient Hospital Stay: Payer: Medicare Other | Attending: Family | Admitting: Family

## 2017-09-02 ENCOUNTER — Inpatient Hospital Stay: Payer: Medicare Other

## 2017-09-02 VITALS — BP 133/76 | HR 85 | Temp 97.6°F | Wt 233.2 lb

## 2017-09-02 DIAGNOSIS — Z79899 Other long term (current) drug therapy: Secondary | ICD-10-CM | POA: Insufficient documentation

## 2017-09-02 DIAGNOSIS — R531 Weakness: Secondary | ICD-10-CM | POA: Insufficient documentation

## 2017-09-02 DIAGNOSIS — K909 Intestinal malabsorption, unspecified: Secondary | ICD-10-CM

## 2017-09-02 DIAGNOSIS — D509 Iron deficiency anemia, unspecified: Secondary | ICD-10-CM | POA: Insufficient documentation

## 2017-09-02 DIAGNOSIS — R5383 Other fatigue: Secondary | ICD-10-CM | POA: Insufficient documentation

## 2017-09-02 DIAGNOSIS — D573 Sickle-cell trait: Secondary | ICD-10-CM | POA: Diagnosis not present

## 2017-09-02 DIAGNOSIS — Z8572 Personal history of non-Hodgkin lymphomas: Secondary | ICD-10-CM | POA: Diagnosis not present

## 2017-09-02 DIAGNOSIS — M7989 Other specified soft tissue disorders: Secondary | ICD-10-CM | POA: Diagnosis not present

## 2017-09-02 LAB — CMP (CANCER CENTER ONLY)
ALT: 25 U/L (ref 0–55)
AST: 22 U/L (ref 5–34)
Albumin: 3.5 g/dL (ref 3.5–5.0)
Alkaline Phosphatase: 54 U/L (ref 40–150)
Anion gap: 11 (ref 3–11)
BUN: 7 mg/dL (ref 7–26)
CO2: 26 mmol/L (ref 22–29)
Calcium: 9.2 mg/dL (ref 8.4–10.4)
Chloride: 109 mmol/L (ref 98–109)
Creatinine: 0.78 mg/dL (ref 0.60–1.10)
GFR, Est AFR Am: >60 mL/min (ref >60)
GFR, Estimated: >60 mL/min (ref >60)
Glucose, Bld: 110 mg/dL (ref 70–140)
Potassium: 3.2 mmol/L — ABNORMAL LOW (ref 3.5–5.1)
Sodium: 146 mmol/L — ABNORMAL HIGH (ref 136–145)
Total Bilirubin: 0.5 mg/dL (ref 0.2–1.2)
Total Protein: 6.3 g/dL — ABNORMAL LOW (ref 6.4–8.3)

## 2017-09-02 LAB — IRON AND TIBC
Iron: 133 ug/dL (ref 41–142)
Saturation Ratios: 60 % — ABNORMAL HIGH (ref 21–57)
TIBC: 223 ug/dL — ABNORMAL LOW (ref 236–444)
UIBC: 89 ug/dL

## 2017-09-02 LAB — CBC WITH DIFFERENTIAL (CANCER CENTER ONLY)
Basophils Absolute: 0 10*3/uL (ref 0.0–0.1)
Basophils Relative: 0 %
Eosinophils Absolute: 0.1 10*3/uL (ref 0.0–0.5)
Eosinophils Relative: 1 %
HCT: 32.8 % — ABNORMAL LOW (ref 34.8–46.6)
Hemoglobin: 11.1 g/dL — ABNORMAL LOW (ref 11.6–15.9)
Lymphocytes Relative: 30 %
Lymphs Abs: 1.6 10*3/uL (ref 0.9–3.3)
MCH: 36.2 pg — ABNORMAL HIGH (ref 26.0–34.0)
MCHC: 33.8 g/dL (ref 32.0–36.0)
MCV: 106.8 fL — ABNORMAL HIGH (ref 81.0–101.0)
Monocytes Absolute: 0.5 10*3/uL (ref 0.1–0.9)
Monocytes Relative: 10 %
Neutro Abs: 3.2 10*3/uL (ref 1.5–6.5)
Neutrophils Relative %: 59 %
Platelet Count: 165 10*3/uL (ref 145–400)
RBC: 3.07 MIL/uL — ABNORMAL LOW (ref 3.70–5.32)
RDW: 15.6 % (ref 11.1–15.7)
WBC Count: 5.5 10*3/uL (ref 3.9–10.0)

## 2017-09-02 LAB — FERRITIN: Ferritin: 542 ng/mL — ABNORMAL HIGH (ref 9–269)

## 2017-09-02 LAB — RETICULOCYTES
RBC.: 3.1 MIL/uL — ABNORMAL LOW (ref 3.70–5.45)
Retic Count, Absolute: 68.2 10*3/uL (ref 33.7–90.7)
Retic Ct Pct: 2.2 % — ABNORMAL HIGH (ref 0.7–2.1)

## 2017-09-02 NOTE — Progress Notes (Signed)
Hematology and Oncology Follow Up Visit  Helen Hardy [id number] 02-14-1951 67 y.o. 09/02/2017   Principle Diagnosis:  1. 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 Sickle cell trait 3. Anemia of chronic disease 4. Iron deficiency anemia  Current Therapy:   IV Iron as indicated - last dose received in December 5732 Folic acid OTC PO daily   Interim History:  Helen Hardy is here today for follow-up. She is finally recuperating from a GI virus. She went to the ED several times for fluids. She is still feeling fatigued and weak. The diarrhea has resolved and she is able to eat again. She is trying to hydrate well at home now. Her weight is stable.  WBC count is 5.5, Hgb 11.1 and platelet count 165.  No fever, chills, n/v, cough, rash, dizziness, SOB, chest pain, palpitations, abdominal pain or changes in bladder habits.  She is having issues with her arthritis and joint aches and pains. She has intermittent swelling in her feet and ankles that improves when she elevates her feet.  She has a left breast biopsy in January which was negative. She is doing self breast exams at home.   ECOG Performance Status: 1 - Symptomatic but completely ambulatory  Medications:  Allergies as of 09/02/2017      Reactions   Oxycodone Nausea And Vomiting      Medication List        Accurate as of 09/02/17  9:48 AM. Always use your most recent med list.          amLODipine 10 MG tablet Commonly known as:  NORVASC Take 10 mg by mouth daily.   ciprofloxacin 500 MG tablet Commonly known as:  CIPRO Take 1 tablet (500 mg total) by mouth 2 (two) times daily.   diazepam 5 MG tablet Commonly known as:  VALIUM Take 1 tablet (5 mg total) by mouth 2 (two) times daily.   estradiol 1 MG tablet Commonly known as:  ESTRACE Take 1 mg by mouth daily.   folic acid 202 MCG tablet Commonly known as:  FOLVITE Take 800 mcg by mouth daily.   HYDROcodone-acetaminophen 5-325  MG tablet Commonly known as:  NORCO/VICODIN Take 1 tablet by mouth 3 (three) times daily as needed for moderate pain.   leflunomide 20 MG tablet Commonly known as:  ARAVA Take 20 mg by mouth daily.   loperamide 2 MG tablet Commonly known as:  IMODIUM A-D Take 1 tablet (2 mg total) by mouth 4 (four) times daily as needed for diarrhea or loose stools.   metroNIDAZOLE 500 MG tablet Commonly known as:  FLAGYL Take 1 tablet (500 mg total) by mouth 2 (two) times daily.   multivitamin tablet Take 1 tablet by mouth daily.   ondansetron 4 MG disintegrating tablet Commonly known as:  ZOFRAN ODT Take 1 tablet (4 mg total) by mouth every 8 (eight) hours as needed.   ondansetron 4 MG disintegrating tablet Commonly known as:  ZOFRAN ODT Take 1 tablet (4 mg total) by mouth every 8 (eight) hours as needed for nausea or vomiting.   ondansetron 4 MG tablet Commonly known as:  ZOFRAN Take 1 tablet (4 mg total) by mouth every 8 (eight) hours as needed for nausea or vomiting.   ORENCIA 250 MG injection Generic drug:  abatacept Inject 250 mg into the vein every 28 (twenty-eight) days.   pantoprazole 40 MG tablet Commonly known as:  PROTONIX Take 40 mg by mouth 2 (two) times daily.  predniSONE 5 MG tablet Commonly known as:  DELTASONE Take 5 mg by mouth daily.   triamcinolone cream 0.1 % Commonly known as:  KENALOG Apply 1 application topically as needed (for rash).   vitamin B-12 1000 MCG tablet Commonly known as:  CYANOCOBALAMIN Take 1,000 mcg by mouth daily.       Allergies:  Allergies  Allergen Reactions  . Oxycodone Nausea And Vomiting    Past Medical History, Surgical history, Social history, and Family History were reviewed and updated.  Review of Systems: All other 10 point review of systems is negative.   Physical Exam:  vitals were not taken for this visit.   Wt Readings from Last 3 Encounters:  08/17/17 232 lb (105.2 kg)  04/29/17 236 lb 8 oz (107.3 kg)    12/25/16 221 lb (100.2 kg)    Ocular: Sclerae unicteric, pupils equal, round and reactive to light Ear-nose-throat: Oropharynx clear, dentition fair Lymphatic: No cervical, supraclavicular or axillary adenopathy Lungs no rales or rhonchi, good excursion bilaterally Heart regular rate and rhythm, no murmur appreciated Abd soft, nontender, positive bowel sounds, no liver or spleen tip palpated on exam, no fluid wave  MSK no focal spinal tenderness, no joint edema Neuro: non-focal, well-oriented, appropriate affect Breasts: Deferred   Lab Results  Component Value Date   WBC 5.5 09/02/2017   HGB 11.9 (L) 08/21/2017   HCT 32.8 (L) 09/02/2017   MCV 106.8 (H) 09/02/2017   PLT 165 09/02/2017   Lab Results  Component Value Date   FERRITIN 478 (H) 04/29/2017   IRON 109 04/29/2017   TIBC 279 04/29/2017   UIBC 170 04/29/2017   IRONPCTSAT 39 04/29/2017   Lab Results  Component Value Date   RETICCTPCT 1.4 06/21/2015   RBC 3.07 (L) 09/02/2017   RETICCTABS 41.0 06/21/2015   No results found for: KPAFRELGTCHN, LAMBDASER, KAPLAMBRATIO No results found for: Kandis Cocking, IGMSERUM No results found for: Odetta Pink, SPEI   Chemistry      Component Value Date/Time   NA 142 08/21/2017 0853   NA 144 04/29/2017 0929   K 3.6 08/21/2017 0853   K 4.0 04/29/2017 0929   CL 111 08/21/2017 0853   CL 103 06/21/2015 0800   CO2 22 08/21/2017 0853   CO2 25 04/29/2017 0929   BUN 14 08/21/2017 0853   BUN 17.7 04/29/2017 0929   CREATININE 0.82 08/21/2017 0853   CREATININE 0.8 04/29/2017 0929      Component Value Date/Time   CALCIUM 8.9 08/21/2017 0853   CALCIUM 9.3 04/29/2017 0929   ALKPHOS 49 08/21/2017 0853   ALKPHOS 55 04/29/2017 0929   AST 84 (H) 08/21/2017 0853   AST 19 04/29/2017 0929   ALT 53 08/21/2017 0853   ALT 16 04/29/2017 0929   BILITOT 0.4 08/21/2017 0853   BILITOT 0.43 04/29/2017 0929      Impression and Plan: Ms.  Helen Hardy is a very pleasant 67 yo African American female with 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 She also has intermittent iron deficiency anemia and sickle cell trait.  She is taking her folic acid daily as prescribed. We will see what her iron studies show and bring her back in for infusion if needed.  We will plan to see her back in another 6 months for follow-up.  She will contact our office with any questions or concerns. We can certainly see her sooner if need be.   Laverna Peace, NP 2/28/20199:48  AM

## 2017-09-09 DIAGNOSIS — M0589 Other rheumatoid arthritis with rheumatoid factor of multiple sites: Secondary | ICD-10-CM | POA: Diagnosis not present

## 2017-09-15 DIAGNOSIS — M48061 Spinal stenosis, lumbar region without neurogenic claudication: Secondary | ICD-10-CM | POA: Diagnosis not present

## 2017-09-15 DIAGNOSIS — G894 Chronic pain syndrome: Secondary | ICD-10-CM | POA: Diagnosis not present

## 2017-09-21 DIAGNOSIS — M19042 Primary osteoarthritis, left hand: Secondary | ICD-10-CM | POA: Diagnosis not present

## 2017-09-21 DIAGNOSIS — M19041 Primary osteoarthritis, right hand: Secondary | ICD-10-CM | POA: Diagnosis not present

## 2017-09-21 DIAGNOSIS — M0579 Rheumatoid arthritis with rheumatoid factor of multiple sites without organ or systems involvement: Secondary | ICD-10-CM | POA: Diagnosis not present

## 2017-10-01 DIAGNOSIS — M5137 Other intervertebral disc degeneration, lumbosacral region: Secondary | ICD-10-CM | POA: Diagnosis not present

## 2017-10-01 DIAGNOSIS — M961 Postlaminectomy syndrome, not elsewhere classified: Secondary | ICD-10-CM | POA: Diagnosis not present

## 2017-10-01 DIAGNOSIS — M5136 Other intervertebral disc degeneration, lumbar region: Secondary | ICD-10-CM | POA: Diagnosis not present

## 2017-10-13 DIAGNOSIS — M0589 Other rheumatoid arthritis with rheumatoid factor of multiple sites: Secondary | ICD-10-CM | POA: Diagnosis not present

## 2017-10-19 DIAGNOSIS — D2372 Other benign neoplasm of skin of left lower limb, including hip: Secondary | ICD-10-CM | POA: Diagnosis not present

## 2017-10-19 DIAGNOSIS — M21612 Bunion of left foot: Secondary | ICD-10-CM | POA: Diagnosis not present

## 2017-11-03 DIAGNOSIS — R600 Localized edema: Secondary | ICD-10-CM | POA: Diagnosis not present

## 2017-11-03 DIAGNOSIS — I10 Essential (primary) hypertension: Secondary | ICD-10-CM | POA: Diagnosis not present

## 2017-11-10 DIAGNOSIS — M0579 Rheumatoid arthritis with rheumatoid factor of multiple sites without organ or systems involvement: Secondary | ICD-10-CM | POA: Diagnosis not present

## 2017-11-10 DIAGNOSIS — M0589 Other rheumatoid arthritis with rheumatoid factor of multiple sites: Secondary | ICD-10-CM | POA: Diagnosis not present

## 2017-11-11 DIAGNOSIS — R609 Edema, unspecified: Secondary | ICD-10-CM | POA: Diagnosis not present

## 2017-11-11 DIAGNOSIS — E876 Hypokalemia: Secondary | ICD-10-CM | POA: Diagnosis not present

## 2017-11-12 DIAGNOSIS — M48061 Spinal stenosis, lumbar region without neurogenic claudication: Secondary | ICD-10-CM | POA: Diagnosis not present

## 2017-11-12 DIAGNOSIS — M4696 Unspecified inflammatory spondylopathy, lumbar region: Secondary | ICD-10-CM | POA: Diagnosis not present

## 2017-11-12 DIAGNOSIS — G894 Chronic pain syndrome: Secondary | ICD-10-CM | POA: Diagnosis not present

## 2017-11-17 DIAGNOSIS — E876 Hypokalemia: Secondary | ICD-10-CM | POA: Diagnosis not present

## 2017-12-09 DIAGNOSIS — M0589 Other rheumatoid arthritis with rheumatoid factor of multiple sites: Secondary | ICD-10-CM | POA: Diagnosis not present

## 2017-12-10 DIAGNOSIS — M5136 Other intervertebral disc degeneration, lumbar region: Secondary | ICD-10-CM | POA: Diagnosis not present

## 2017-12-10 DIAGNOSIS — M47816 Spondylosis without myelopathy or radiculopathy, lumbar region: Secondary | ICD-10-CM | POA: Diagnosis not present

## 2017-12-29 DIAGNOSIS — Z23 Encounter for immunization: Secondary | ICD-10-CM | POA: Diagnosis not present

## 2017-12-29 DIAGNOSIS — R350 Frequency of micturition: Secondary | ICD-10-CM | POA: Diagnosis not present

## 2017-12-29 DIAGNOSIS — R7309 Other abnormal glucose: Secondary | ICD-10-CM | POA: Diagnosis not present

## 2018-01-10 DIAGNOSIS — M0589 Other rheumatoid arthritis with rheumatoid factor of multiple sites: Secondary | ICD-10-CM | POA: Diagnosis not present

## 2018-02-07 DIAGNOSIS — M0579 Rheumatoid arthritis with rheumatoid factor of multiple sites without organ or systems involvement: Secondary | ICD-10-CM | POA: Diagnosis not present

## 2018-02-21 DIAGNOSIS — G894 Chronic pain syndrome: Secondary | ICD-10-CM | POA: Diagnosis not present

## 2018-02-21 DIAGNOSIS — M5417 Radiculopathy, lumbosacral region: Secondary | ICD-10-CM | POA: Diagnosis not present

## 2018-02-21 DIAGNOSIS — R110 Nausea: Secondary | ICD-10-CM | POA: Diagnosis not present

## 2018-03-03 ENCOUNTER — Other Ambulatory Visit: Payer: Self-pay

## 2018-03-03 ENCOUNTER — Encounter: Payer: Self-pay | Admitting: Hematology & Oncology

## 2018-03-03 ENCOUNTER — Inpatient Hospital Stay: Payer: Medicare Other | Attending: Family | Admitting: Hematology & Oncology

## 2018-03-03 ENCOUNTER — Inpatient Hospital Stay: Payer: Medicare Other

## 2018-03-03 VITALS — BP 153/72 | HR 70 | Temp 98.3°F | Resp 19 | Wt 224.0 lb

## 2018-03-03 DIAGNOSIS — D509 Iron deficiency anemia, unspecified: Secondary | ICD-10-CM

## 2018-03-03 DIAGNOSIS — Z8572 Personal history of non-Hodgkin lymphomas: Secondary | ICD-10-CM

## 2018-03-03 DIAGNOSIS — K909 Intestinal malabsorption, unspecified: Secondary | ICD-10-CM

## 2018-03-03 DIAGNOSIS — Z79899 Other long term (current) drug therapy: Secondary | ICD-10-CM | POA: Diagnosis not present

## 2018-03-03 DIAGNOSIS — D573 Sickle-cell trait: Secondary | ICD-10-CM | POA: Diagnosis not present

## 2018-03-03 DIAGNOSIS — M129 Arthropathy, unspecified: Secondary | ICD-10-CM | POA: Diagnosis not present

## 2018-03-03 DIAGNOSIS — D500 Iron deficiency anemia secondary to blood loss (chronic): Secondary | ICD-10-CM

## 2018-03-03 LAB — CBC WITH DIFFERENTIAL/PLATELET
Basophils Absolute: 0 10*3/uL (ref 0.0–0.1)
Basophils Relative: 0 %
Eosinophils Absolute: 0.1 10*3/uL (ref 0.0–0.7)
Eosinophils Relative: 2 %
HCT: 33.6 % — ABNORMAL LOW (ref 36.0–46.0)
Hemoglobin: 11.5 g/dL — ABNORMAL LOW (ref 12.0–15.0)
Lymphocytes Relative: 52 %
Lymphs Abs: 2.6 10*3/uL (ref 0.7–4.0)
MCH: 36.5 pg — ABNORMAL HIGH (ref 26.0–34.0)
MCHC: 34.2 g/dL (ref 30.0–36.0)
MCV: 106.7 fL — ABNORMAL HIGH (ref 78.0–100.0)
Monocytes Absolute: 0.5 10*3/uL (ref 0.1–1.0)
Monocytes Relative: 10 %
Neutro Abs: 1.8 10*3/uL (ref 1.7–7.7)
Neutrophils Relative %: 36 %
Platelets: 151 10*3/uL (ref 150–400)
RBC: 3.15 MIL/uL — ABNORMAL LOW (ref 3.87–5.11)
RDW: 13.9 % (ref 11.5–15.5)
WBC: 5 10*3/uL (ref 4.0–10.5)

## 2018-03-03 LAB — CMP (CANCER CENTER ONLY)
ALT: 14 U/L (ref 0–44)
AST: 21 U/L (ref 15–41)
Albumin: 3.8 g/dL (ref 3.5–5.0)
Alkaline Phosphatase: 64 U/L (ref 38–126)
Anion gap: 12 (ref 5–15)
BUN: 15 mg/dL (ref 8–23)
CO2: 30 mmol/L (ref 22–32)
Calcium: 10.1 mg/dL (ref 8.9–10.3)
Chloride: 105 mmol/L (ref 98–111)
Creatinine: 0.87 mg/dL (ref 0.44–1.00)
GFR, Est AFR Am: >60 mL/min (ref >60)
GFR, Estimated: >60 mL/min (ref >60)
Glucose, Bld: 109 mg/dL — ABNORMAL HIGH (ref 70–99)
Potassium: 3.8 mmol/L (ref 3.5–5.1)
Sodium: 147 mmol/L — ABNORMAL HIGH (ref 135–145)
Total Bilirubin: 0.5 mg/dL (ref 0.3–1.2)
Total Protein: 6.8 g/dL (ref 6.5–8.1)

## 2018-03-03 LAB — IRON AND TIBC
Iron: 127 ug/dL (ref 41–142)
Saturation Ratios: 45 % (ref 21–57)
TIBC: 284 ug/dL (ref 236–444)
UIBC: 157 ug/dL

## 2018-03-03 LAB — FERRITIN: Ferritin: 709 ng/mL — ABNORMAL HIGH (ref 11–307)

## 2018-03-03 LAB — RETICULOCYTES
RBC.: 3.17 MIL/uL — ABNORMAL LOW (ref 3.70–5.45)
Retic Count, Absolute: 76.1 10*3/uL (ref 33.7–90.7)
Retic Ct Pct: 2.4 % — ABNORMAL HIGH (ref 0.7–2.1)

## 2018-03-03 NOTE — Progress Notes (Signed)
Hematology and Oncology Follow Up Visit  Helen Hardy [id number] 10/02/1950 67 y.o. 03/03/2018   Principle Diagnosis:  1. 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 Sickle cell trait 3. Anemia of chronic disease 4. Iron deficiency anemia  Current Therapy:   IV Iron as indicated - last dose received in December 8588 Folic acid OTC PO daily   Interim History:  [name] is here today for follow-up.she is doing pretty well.  We see her every 6 months.  She has quite a few doctors that she sees.  She has a lot of problems with arthritis.  She is been getting epidural injections.  Her last iron studies done back in February 2019 showed a ferritin of 542 with an iron saturation of 60%.  She is had a good appetite.  She is had no nausea or vomiting.  She is had no rashes.  She has had no headaches.  Last mammogram was done back in January.  Everything looked okay.  She has some calcifications in the left breast.  I think she underwent a biopsy.  She does not have a fibroadenoma.  ECOG Performance Status: 1 - Symptomatic but completely ambulatory  Medications:  Allergies as of 03/03/2018      Reactions   Oxycodone Nausea And Vomiting      Medication List        Accurate as of 03/03/18 10:34 AM. Always use your most recent med list.          amLODipine 10 MG tablet Commonly known as:  NORVASC Take 10 mg by mouth daily.   ciprofloxacin 500 MG tablet Commonly known as:  CIPRO Take 1 tablet (500 mg total) by mouth 2 (two) times daily.   diazepam 5 MG tablet Commonly known as:  VALIUM Take 1 tablet (5 mg total) by mouth 2 (two) times daily.   estradiol 1 MG tablet Commonly known as:  ESTRACE Take 1 mg by mouth daily.   folic acid 502 MCG tablet Commonly known as:  FOLVITE Take 800 mcg by mouth daily.   HYDROcodone-acetaminophen 5-325 MG tablet Commonly known as:  NORCO/VICODIN Take 1 tablet by mouth 3 (three) times daily as needed  for moderate pain.   leflunomide 20 MG tablet Commonly known as:  ARAVA Take 20 mg by mouth daily.   loperamide 2 MG tablet Commonly known as:  IMODIUM A-D Take 1 tablet (2 mg total) by mouth 4 (four) times daily as needed for diarrhea or loose stools.   metroNIDAZOLE 500 MG tablet Commonly known as:  FLAGYL Take 1 tablet (500 mg total) by mouth 2 (two) times daily.   multivitamin tablet Take 1 tablet by mouth daily.   ondansetron 4 MG disintegrating tablet Commonly known as:  ZOFRAN-ODT Take 1 tablet (4 mg total) by mouth every 8 (eight) hours as needed.   ondansetron 4 MG disintegrating tablet Commonly known as:  ZOFRAN-ODT Take 1 tablet (4 mg total) by mouth every 8 (eight) hours as needed for nausea or vomiting.   ondansetron 4 MG tablet Commonly known as:  ZOFRAN Take 1 tablet (4 mg total) by mouth every 8 (eight) hours as needed for nausea or vomiting.   ORENCIA 250 MG injection Generic drug:  abatacept Inject 250 mg into the vein every 28 (twenty-eight) days.   pantoprazole 40 MG tablet Commonly known as:  PROTONIX Take 40 mg by mouth 2 (two) times daily.   predniSONE 5 MG tablet Commonly known as:  DELTASONE Take  5 mg by mouth daily.   triamcinolone cream 0.1 % Commonly known as:  KENALOG Apply 1 application topically as needed (for rash).   vitamin B-12 1000 MCG tablet Commonly known as:  CYANOCOBALAMIN Take 1,000 mcg by mouth daily.       Allergies:  Allergies  Allergen Reactions  . Oxycodone Nausea And Vomiting    Past Medical History, Surgical history, Social history, and Family History were reviewed and updated.  Review of Systems: ROS    Physical Exam:  weight is 224 lb (101.6 kg). Her oral temperature is 98.3 F (36.8 C). Her blood pressure is 153/72 (abnormal) and her pulse is 70. Her respiration is 19 and oxygen saturation is 100%.   Wt Readings from Last 3 Encounters:  03/03/18 224 lb (101.6 kg)  09/02/17 233 lb 4 oz (105.8 kg)    08/17/17 232 lb (105.2 kg)    Ocular: Sclerae unicteric, pupils equal, round and reactive to light Ear-nose-throat: Oropharynx clear, dentition fair Lymphatic: No cervical, supraclavicular or axillary adenopathy Lungs no rales or rhonchi, good excursion bilaterally Heart regular rate and rhythm, no murmur appreciated Abd soft, nontender, positive bowel sounds, no liver or spleen tip palpated on exam, no fluid wave  MSK no focal spinal tenderness, no joint edema Neuro: non-focal, well-oriented, appropriate affect Breasts: Deferred   Lab Results  Component Value Date   WBC 5.0 03/03/2018   HGB 11.5 (L) 03/03/2018   HCT 33.6 (L) 03/03/2018   MCV 106.7 (H) 03/03/2018   PLT 151 03/03/2018   Lab Results  Component Value Date   FERRITIN 542 (H) 09/02/2017   IRON 133 09/02/2017   TIBC 223 (L) 09/02/2017   UIBC 89 09/02/2017   IRONPCTSAT 60 (H) 09/02/2017   Lab Results  Component Value Date   RETICCTPCT 2.2 (H) 09/02/2017   RBC 3.15 (L) 03/03/2018   RETICCTABS 41.0 06/21/2015   No results found for: KPAFRELGTCHN, LAMBDASER, KAPLAMBRATIO No results found for: IGGSERUM, IGA, IGMSERUM No results found for: Kathrynn Ducking, MSPIKE, SPEI   Chemistry      Component Value Date/Time   NA 146 (H) 09/02/2017 0913   NA 144 04/29/2017 0929   K 3.2 (L) 09/02/2017 0913   K 4.0 04/29/2017 0929   CL 109 09/02/2017 0913   CL 103 06/21/2015 0800   CO2 26 09/02/2017 0913   CO2 25 04/29/2017 0929   BUN 7 09/02/2017 0913   BUN 17.7 04/29/2017 0929   CREATININE 0.78 09/02/2017 0913   CREATININE 0.8 04/29/2017 0929      Component Value Date/Time   CALCIUM 9.2 09/02/2017 0913   CALCIUM 9.3 04/29/2017 0929   ALKPHOS 54 09/02/2017 0913   ALKPHOS 55 04/29/2017 0929   AST 22 09/02/2017 0913   AST 19 04/29/2017 0929   ALT 25 09/02/2017 0913   ALT 16 04/29/2017 0929   BILITOT 0.5 09/02/2017 0913   BILITOT 0.43 04/29/2017 0929      Impression  and Plan: Helen Hardy is a very pleasant 67 yo African American female with 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 She also has intermittent iron deficiency anemia and sickle cell trait.   She is taking her folic acid daily as prescribed.   We will see what her iron studies show and bring her back in for infusion if needed.   We will plan to see her back in another 6 months for follow-up.   She will contact our office with  any questions or concerns. We can certainly see her sooner if need be.   Volanda Napoleon, MD 8/29/201910:34 AM

## 2018-03-10 DIAGNOSIS — M0589 Other rheumatoid arthritis with rheumatoid factor of multiple sites: Secondary | ICD-10-CM | POA: Diagnosis not present

## 2018-04-07 DIAGNOSIS — M0589 Other rheumatoid arthritis with rheumatoid factor of multiple sites: Secondary | ICD-10-CM | POA: Diagnosis not present

## 2018-04-08 DIAGNOSIS — M792 Neuralgia and neuritis, unspecified: Secondary | ICD-10-CM | POA: Diagnosis not present

## 2018-04-08 DIAGNOSIS — M5137 Other intervertebral disc degeneration, lumbosacral region: Secondary | ICD-10-CM | POA: Diagnosis not present

## 2018-04-22 ENCOUNTER — Emergency Department (HOSPITAL_COMMUNITY)
Admission: EM | Admit: 2018-04-22 | Discharge: 2018-04-23 | Disposition: A | Discharge status: Home / Self Care | Payer: Medicare Other | Attending: Emergency Medicine | Admitting: Emergency Medicine

## 2018-04-22 ENCOUNTER — Encounter (HOSPITAL_COMMUNITY): Payer: Self-pay | Admitting: *Deleted

## 2018-04-22 ENCOUNTER — Other Ambulatory Visit: Payer: Self-pay

## 2018-04-22 DIAGNOSIS — Z79899 Other long term (current) drug therapy: Secondary | ICD-10-CM | POA: Insufficient documentation

## 2018-04-22 DIAGNOSIS — I10 Essential (primary) hypertension: Secondary | ICD-10-CM | POA: Insufficient documentation

## 2018-04-22 DIAGNOSIS — H11429 Conjunctival edema, unspecified eye: Secondary | ICD-10-CM | POA: Diagnosis not present

## 2018-04-22 DIAGNOSIS — Y939 Activity, unspecified: Secondary | ICD-10-CM | POA: Diagnosis not present

## 2018-04-22 DIAGNOSIS — Y929 Unspecified place or not applicable: Secondary | ICD-10-CM | POA: Diagnosis not present

## 2018-04-22 DIAGNOSIS — H11422 Conjunctival edema, left eye: Secondary | ICD-10-CM | POA: Diagnosis not present

## 2018-04-22 DIAGNOSIS — Y33XXXA Other specified events, undetermined intent, initial encounter: Secondary | ICD-10-CM | POA: Diagnosis not present

## 2018-04-22 DIAGNOSIS — H5789 Other specified disorders of eye and adnexa: Secondary | ICD-10-CM | POA: Diagnosis present

## 2018-04-22 DIAGNOSIS — Y998 Other external cause status: Secondary | ICD-10-CM | POA: Diagnosis not present

## 2018-04-22 DIAGNOSIS — S0502XA Injury of conjunctiva and corneal abrasion without foreign body, left eye, initial encounter: Secondary | ICD-10-CM | POA: Insufficient documentation

## 2018-04-22 MED ORDER — TETRACAINE HCL 0.5 % OP SOLN
2.0000 [drp] | Freq: Once | OPHTHALMIC | Status: AC
  Administered 2018-04-22: 2 [drp] via OPHTHALMIC
  Filled 2018-04-22: qty 4

## 2018-04-22 MED ORDER — FLUORESCEIN SODIUM 1 MG OP STRP
1.0000 | ORAL_STRIP | Freq: Once | OPHTHALMIC | Status: AC
  Administered 2018-04-22: 1 via OPHTHALMIC
  Filled 2018-04-22: qty 1

## 2018-04-22 NOTE — ED Triage Notes (Signed)
Pt says she noticed her left eye appears blood shot this evening. C/o soreness in the lateral eye, no visual changes. No headache at this time.

## 2018-04-22 NOTE — ED Notes (Signed)
Visual acuity: both eye 20/40, rt. Eye:20/40, lt. Eye:20/40, pt. No corrective lense or no eye contacts.

## 2018-04-23 DIAGNOSIS — S0502XA Injury of conjunctiva and corneal abrasion without foreign body, left eye, initial encounter: Secondary | ICD-10-CM | POA: Diagnosis not present

## 2018-04-23 MED ORDER — ERYTHROMYCIN 5 MG/GM OP OINT
TOPICAL_OINTMENT | Freq: Once | OPHTHALMIC | Status: AC
  Administered 2018-04-23: 1 via OPHTHALMIC
  Filled 2018-04-23: qty 3.5

## 2018-04-23 MED ORDER — ERYTHROMYCIN 5 MG/GM OP OINT: TOPICAL_OINTMENT | OPHTHALMIC | 0 refills | Status: DC

## 2018-04-23 NOTE — Discharge Instructions (Signed)
We signed the ER for the redness in the eye.  Our evaluation shows that you have abrasion of the cornea and conjunctiva. Please apply the antibiotic ointment as recommended and call your ophthalmologist for an appointment on Monday or Tuesday.  Return to the ER if you start having eye pain or changes in your vision.
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
## 2018-04-25 DIAGNOSIS — H1132 Conjunctival hemorrhage, left eye: Secondary | ICD-10-CM | POA: Diagnosis not present

## 2018-04-25 DIAGNOSIS — Z961 Presence of intraocular lens: Secondary | ICD-10-CM | POA: Diagnosis not present

## 2018-04-25 DIAGNOSIS — I10 Essential (primary) hypertension: Secondary | ICD-10-CM | POA: Diagnosis not present

## 2018-05-05 DIAGNOSIS — M0589 Other rheumatoid arthritis with rheumatoid factor of multiple sites: Secondary | ICD-10-CM | POA: Diagnosis not present

## 2018-05-05 DIAGNOSIS — M0579 Rheumatoid arthritis with rheumatoid factor of multiple sites without organ or systems involvement: Secondary | ICD-10-CM | POA: Diagnosis not present

## 2018-05-31 ENCOUNTER — Other Ambulatory Visit: Payer: Self-pay | Admitting: Obstetrics and Gynecology

## 2018-05-31 DIAGNOSIS — Z1231 Encounter for screening mammogram for malignant neoplasm of breast: Secondary | ICD-10-CM

## 2018-06-09 DIAGNOSIS — M0589 Other rheumatoid arthritis with rheumatoid factor of multiple sites: Secondary | ICD-10-CM | POA: Diagnosis not present

## 2018-06-13 DIAGNOSIS — Z01419 Encounter for gynecological examination (general) (routine) without abnormal findings: Secondary | ICD-10-CM | POA: Diagnosis not present

## 2018-06-13 DIAGNOSIS — A630 Anogenital (venereal) warts: Secondary | ICD-10-CM | POA: Diagnosis not present

## 2018-06-15 ENCOUNTER — Emergency Department (HOSPITAL_COMMUNITY)
Admission: EM | Admit: 2018-06-15 | Discharge: 2018-06-15 | Disposition: A | Discharge status: Home / Self Care | Payer: Medicare Other | Attending: Emergency Medicine | Admitting: Emergency Medicine

## 2018-06-15 ENCOUNTER — Other Ambulatory Visit: Payer: Self-pay

## 2018-06-15 ENCOUNTER — Emergency Department (HOSPITAL_COMMUNITY): Payer: Medicare Other

## 2018-06-15 ENCOUNTER — Encounter (HOSPITAL_COMMUNITY): Payer: Self-pay

## 2018-06-15 DIAGNOSIS — Z79899 Other long term (current) drug therapy: Secondary | ICD-10-CM | POA: Diagnosis not present

## 2018-06-15 DIAGNOSIS — M5416 Radiculopathy, lumbar region: Secondary | ICD-10-CM

## 2018-06-15 DIAGNOSIS — M25561 Pain in right knee: Secondary | ICD-10-CM | POA: Diagnosis not present

## 2018-06-15 DIAGNOSIS — I10 Essential (primary) hypertension: Secondary | ICD-10-CM | POA: Insufficient documentation

## 2018-06-15 DIAGNOSIS — M545 Low back pain: Secondary | ICD-10-CM | POA: Diagnosis not present

## 2018-06-15 MED ORDER — MORPHINE SULFATE (PF) 4 MG/ML IV SOLN
4.0000 mg | Freq: Once | INTRAVENOUS | Status: AC
  Administered 2018-06-15: 4 mg via INTRAMUSCULAR
  Filled 2018-06-15: qty 1

## 2018-06-15 MED ORDER — OXYCODONE-ACETAMINOPHEN 5-325 MG PO TABS: 1.0000 | ORAL_TABLET | Freq: Four times a day (QID) | ORAL | 0 refills | Status: DC | PRN

## 2018-06-15 MED ORDER — ONDANSETRON 8 MG PO TBDP: ORAL_TABLET | ORAL | 0 refills | Status: DC

## 2018-06-15 NOTE — ED Triage Notes (Signed)
Pt states that she has a hx of rheumatoid arthritis in her back. Pt states she also had surgery in her back, which she has nerve damage. Pt took hydrocodone PTA, and she stated she was in too much pain prior to that to move.  Pt states pain is mostly in the lubar area.

## 2018-06-15 NOTE — ED Provider Notes (Signed)
[facility] DEPT Provider Note   CSN: [id number] Arrival date & time: 06/15/18  7253     History   Chief Complaint Chief Complaint  Patient presents with  . Back Pain    HPI Helen Hardy is a 67 y.o. female.  Patient is a 67 year old female with past medical history of rheumatoid arthritis with multiple joint surgeries and back surgery.  She presents today with complaints of back pain.  She had lumbar fusion performed in 2017 and was told she may need additional surgery in the near future.  Today her pain became much worse and is concerned something may have "collapsed" in her back.  She reports radiation of the pain into her leg and foot, but denies any weakness.  She denies any bowel or bladder complaints.  The history is provided by the patient.  Back Pain   This is a recurrent problem. The current episode started yesterday. The problem occurs constantly. The problem has been rapidly worsening. The pain is associated with no known injury. The pain is present in the lumbar spine. The quality of the pain is described as shooting. The pain radiates to the right thigh and right foot. The pain is moderate. The symptoms are aggravated by certain positions. The pain is the same all the time.    Past Medical History:  Diagnosis Date  . Anemia   . Arthritis    Rheumatoid  . Cancer ([place])    Non-Hodgkins Lymphoma  . GERD (gastroesophageal reflux disease)   . Hypertension   . 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 Numbness and tingling of right leg   . Pneumonia     Patient Active Problem List   Diagnosis Date Noted  . 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 Iron deficiency anemia 06/21/2015  . 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 Morbid obesity ([place]) 04/10/2015  . 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 S/P knee replacement 04/08/2015    Past Surgical History:  Procedure Laterality Date  . 2012 Right big toe and toe beside - had pins in toes      . ABDOMINAL HYSTERECTOMY    . bladder tack - 2005    . EYE SURGERY Bilateral    cataract  . JOINT REPLACEMENT     both knee replacement  . LUMBAR LAMINECTOMY/DECOMPRESSION MICRODISCECTOMY N/A 03/04/2016   Procedure: RIGHT FAR LATERAL DISCECTOMY L5-S1;  Surgeon: Melina Schools, MD;  Location: [facility];  Service: Orthopedics;  Laterality: N/A;  . ROTATOR CUFF REPAIR Left   . TOTAL KNEE REVISION Left 04/08/2015   Procedure: REVISION LEFT TOTAL KNEE ;  Surgeon: Paralee Cancel, MD;  Location[facility];  Service: Orthopedics;  Laterality: Left;     OB History   None      Home Medications    Prior to Admission medications   Medication Sig Start Date End Date Taking? Authorizing Provider  abatacept (ORENCIA) 250 MG injection Inject 250 mg into the vein every 28 (twenty-eight) days.    [provider]  amLODipine (NORVASC) 10 MG tablet Take 10 mg by mouth daily.    [provider]  Cholecalciferol (VITAMIN D3) 1000 units CAPS Take 1 capsule by mouth daily.    [provider]  erythromycin ophthalmic ointment Place a 1/2 inch ribbon of ointment into the lower eyelid. 04/23/18   Varney Biles, MD  estradiol (ESTRACE) 1 MG tablet Take 1 mg by mouth daily.  12/25/12   [provider]  folic acid (FOLVITE)  800 MCG tablet Take 800 mcg by mouth daily.     [provider]  gabapentin (NEURONTIN) 300 MG capsule Take 300 mg by mouth 3 (three) times daily. 03/17/18   [provider]  HYDROcodone-acetaminophen (NORCO/VICODIN) 5-325 MG tablet Take 1 tablet by mouth 3 (three) times daily as needed for moderate pain.    [provider]  leflunomide (ARAVA) 20 MG tablet Take 20 mg by mouth daily.    [provider]  methocarbamol (ROBAXIN) 500 MG tablet Take 750 mg by mouth 3 (three) times daily. 03/11/18   [provider]  Multiple Vitamin (MULTIVITAMIN) tablet Take 1 tablet by mouth daily.    [provider]  ondansetron  (ZOFRAN ODT) 4 MG disintegrating tablet Take 1 tablet (4 mg total) by mouth every 8 (eight) hours as needed for nausea or vomiting. 08/21/17   Fredia Sorrow, MD  pantoprazole (PROTONIX) 40 MG tablet Take 40 mg by mouth 2 (two) times daily.     [provider]  predniSONE (DELTASONE) 10 MG tablet Take 10 mg by mouth daily. 04/12/18   [provider]  triamcinolone cream (KENALOG) 0.1 % Apply 1 application topically as needed (for rash).     [provider]  VITAMIN A PO Take 1 tablet by mouth daily.    [provider]  vitamin B-12 (CYANOCOBALAMIN) 1000 MCG tablet Take 1,000 mcg by mouth daily.    [provider]    Family History No family history on file.  Social History Social History   Tobacco Use  . Smoking status: Never Smoker  . Smokeless tobacco: Never Used  Substance Use Topics  . Alcohol use: No    Alcohol/week: 0.0 standard drinks  . Drug use: No     Allergies   Oxycodone   Review of Systems Review of Systems  Musculoskeletal: Positive for back pain.  All other systems reviewed and are negative.    Physical Exam Updated Vital Signs BP (!) 173/120 (BP Location: Right Arm)   Pulse 91   Temp 98.3 F (36.8 C) (Oral)   Resp 16   Ht 4' 11"  (1.499 m)   Wt 105.2 kg   SpO2 96%   BMI 46.86 kg/m   Physical Exam  Constitutional: She is oriented to person, place, and time. She appears well-developed and well-nourished. No distress.  HENT:  Head: Normocephalic and atraumatic.  Neck: Normal range of motion. Neck supple.  Pulmonary/Chest: Effort normal.  Musculoskeletal: Normal range of motion.  There is tenderness to palpation in the soft tissues of the lumbar region.  There is no bony tenderness or step-off.  Neurological: She is alert and oriented to person, place, and time.  DTRs are trace and symmetrical in both lower extremities.  Strength is 5 out of 5 in both lower extremities and she is able to ambulate, however  with an antalgic gait.  Skin: She is not diaphoretic.  Nursing note and vitals reviewed.    ED Treatments / Results  Labs (all labs ordered are listed, but only abnormal results are displayed) Labs Reviewed - No data to display  EKG None  Radiology No results found.  Procedures Procedures (including critical care time)  Medications Ordered in ED Medications  morphine 4 MG/ML injection 4 mg (has no administration in time range)     Initial Impression / Assessment and Plan / ED Course  I have reviewed the triage vital signs and the nursing notes.  Pertinent labs & imaging results that  were available during my care of the patient were reviewed by me and considered in my medical decision making (see chart for details).  Patient with history of rheumatoid arthritis and prior back and knee surgery presenting with complaints of back pain radiating into the right leg.  There are no findings on her exam that would suggest an emergent situation.  Her reflexes are symmetrical, strength is symmetrical, and there are no bowel or bladder issues.  She is feeling better after medications given here in the ER.  Her x-rays show no obvious abnormality in the back and knee.  At this point, I see no indication for admission or further imaging studies.  I will advised her to follow-up with her orthopedic surgeon in the very near future.  Final Clinical Impressions(s) / ED Diagnoses   Final diagnoses:  None    ED Discharge Orders    None       Veryl Speak, MD 06/15/18 1257

## 2018-06-15 NOTE — ED Notes (Signed)
RN made aware of pts elevated BP

## 2018-06-15 NOTE — Discharge Instructions (Addendum)
Percocet as prescribed as needed for pain.  Zofran as prescribed as needed for nausea.  Stop taking your hydrocodone while taking Percocet.  Follow-up with Dr. Rolena Infante in the next few days.

## 2018-06-16 DIAGNOSIS — Z79899 Other long term (current) drug therapy: Secondary | ICD-10-CM | POA: Diagnosis not present

## 2018-06-16 DIAGNOSIS — M5416 Radiculopathy, lumbar region: Secondary | ICD-10-CM | POA: Diagnosis not present

## 2018-06-16 DIAGNOSIS — M961 Postlaminectomy syndrome, not elsewhere classified: Secondary | ICD-10-CM | POA: Diagnosis not present

## 2018-06-16 DIAGNOSIS — Z5181 Encounter for therapeutic drug level monitoring: Secondary | ICD-10-CM | POA: Diagnosis not present

## 2018-06-20 DIAGNOSIS — M25561 Pain in right knee: Secondary | ICD-10-CM | POA: Diagnosis not present

## 2018-07-04 DIAGNOSIS — M25561 Pain in right knee: Secondary | ICD-10-CM | POA: Diagnosis not present

## 2018-07-07 DIAGNOSIS — M0589 Other rheumatoid arthritis with rheumatoid factor of multiple sites: Secondary | ICD-10-CM | POA: Diagnosis not present

## 2018-07-07 DIAGNOSIS — M0579 Rheumatoid arthritis with rheumatoid factor of multiple sites without organ or systems involvement: Secondary | ICD-10-CM | POA: Diagnosis not present

## 2018-07-15 ENCOUNTER — Ambulatory Visit
Admission: RE | Admit: 2018-07-15 | Discharge: 2018-07-15 | Disposition: A | Discharge status: Home / Self Care | Payer: Medicare Other | Source: Ambulatory Visit | Attending: Obstetrics and Gynecology | Admitting: Obstetrics and Gynecology

## 2018-07-15 DIAGNOSIS — Z1231 Encounter for screening mammogram for malignant neoplasm of breast: Secondary | ICD-10-CM

## 2018-07-15 DIAGNOSIS — Z961 Presence of intraocular lens: Secondary | ICD-10-CM | POA: Diagnosis not present

## 2018-07-15 DIAGNOSIS — H18413 Arcus senilis, bilateral: Secondary | ICD-10-CM | POA: Diagnosis not present

## 2018-07-15 DIAGNOSIS — H02831 Dermatochalasis of right upper eyelid: Secondary | ICD-10-CM | POA: Diagnosis not present

## 2018-07-15 DIAGNOSIS — I10 Essential (primary) hypertension: Secondary | ICD-10-CM | POA: Diagnosis not present

## 2018-07-18 DIAGNOSIS — S82041K Displaced comminuted fracture of right patella, subsequent encounter for closed fracture with nonunion: Secondary | ICD-10-CM | POA: Diagnosis not present

## 2018-07-18 DIAGNOSIS — M25561 Pain in right knee: Secondary | ICD-10-CM | POA: Diagnosis not present

## 2018-08-04 DIAGNOSIS — M0589 Other rheumatoid arthritis with rheumatoid factor of multiple sites: Secondary | ICD-10-CM | POA: Diagnosis not present

## 2018-08-30 DIAGNOSIS — I10 Essential (primary) hypertension: Secondary | ICD-10-CM | POA: Diagnosis not present

## 2018-08-31 DIAGNOSIS — M25561 Pain in right knee: Secondary | ICD-10-CM | POA: Diagnosis not present

## 2018-08-31 DIAGNOSIS — S82041G Displaced comminuted fracture of right patella, subsequent encounter for closed fracture with delayed healing: Secondary | ICD-10-CM | POA: Diagnosis not present

## 2018-09-01 ENCOUNTER — Inpatient Hospital Stay: Payer: Medicare Other | Attending: Hematology & Oncology | Admitting: Hematology & Oncology

## 2018-09-01 ENCOUNTER — Encounter: Payer: Self-pay | Admitting: Hematology & Oncology

## 2018-09-01 ENCOUNTER — Other Ambulatory Visit: Payer: Self-pay

## 2018-09-01 ENCOUNTER — Inpatient Hospital Stay: Payer: Medicare Other

## 2018-09-01 VITALS — BP 148/94 | HR 84 | Temp 98.4°F | Resp 19 | Wt 236.0 lb

## 2018-09-01 DIAGNOSIS — Z79899 Other long term (current) drug therapy: Secondary | ICD-10-CM

## 2018-09-01 DIAGNOSIS — D509 Iron deficiency anemia, unspecified: Secondary | ICD-10-CM | POA: Insufficient documentation

## 2018-09-01 DIAGNOSIS — D573 Sickle-cell trait: Secondary | ICD-10-CM | POA: Insufficient documentation

## 2018-09-01 DIAGNOSIS — M129 Arthropathy, unspecified: Secondary | ICD-10-CM

## 2018-09-01 DIAGNOSIS — Z8572 Personal history of non-Hodgkin lymphomas: Secondary | ICD-10-CM | POA: Diagnosis not present

## 2018-09-01 DIAGNOSIS — D500 Iron deficiency anemia secondary to blood loss (chronic): Secondary | ICD-10-CM

## 2018-09-01 DIAGNOSIS — K909 Intestinal malabsorption, unspecified: Secondary | ICD-10-CM

## 2018-09-01 LAB — CBC WITH DIFFERENTIAL (CANCER CENTER ONLY)
Abs Immature Granulocytes: 0.04 10*3/uL (ref 0.00–0.07)
Basophils Absolute: 0 10*3/uL (ref 0.0–0.1)
Basophils Relative: 0 %
Eosinophils Absolute: 0.1 10*3/uL (ref 0.0–0.5)
Eosinophils Relative: 1 %
HCT: 32.9 % — ABNORMAL LOW (ref 36.0–46.0)
Hemoglobin: 10.7 g/dL — ABNORMAL LOW (ref 12.0–15.0)
Immature Granulocytes: 1 %
Lymphocytes Relative: 26 %
Lymphs Abs: 1.5 10*3/uL (ref 0.7–4.0)
MCH: 35.7 pg — ABNORMAL HIGH (ref 26.0–34.0)
MCHC: 32.5 g/dL (ref 30.0–36.0)
MCV: 109.7 fL — ABNORMAL HIGH (ref 80.0–100.0)
Monocytes Absolute: 0.5 10*3/uL (ref 0.1–1.0)
Monocytes Relative: 9 %
Neutro Abs: 3.6 10*3/uL (ref 1.7–7.7)
Neutrophils Relative %: 63 %
Platelet Count: 146 10*3/uL — ABNORMAL LOW (ref 150–400)
RBC: 3 MIL/uL — ABNORMAL LOW (ref 3.87–5.11)
RDW: 15.9 % — ABNORMAL HIGH (ref 11.5–15.5)
WBC Count: 5.6 10*3/uL (ref 4.0–10.5)
nRBC: 0 % (ref 0.0–0.2)

## 2018-09-01 LAB — CMP (CANCER CENTER ONLY)
ALT: 14 U/L (ref 0–44)
AST: 18 U/L (ref 15–41)
Albumin: 4.4 g/dL (ref 3.5–5.0)
Alkaline Phosphatase: 58 U/L (ref 38–126)
Anion gap: 9 (ref 5–15)
BUN: 21 mg/dL (ref 8–23)
CO2: 30 mmol/L (ref 22–32)
Calcium: 9.7 mg/dL (ref 8.9–10.3)
Chloride: 103 mmol/L (ref 98–111)
Creatinine: 1.03 mg/dL — ABNORMAL HIGH (ref 0.44–1.00)
GFR, Est AFR Am: >60 mL/min (ref >60)
GFR, Estimated: 55 mL/min — ABNORMAL LOW (ref >60)
Glucose, Bld: 113 mg/dL — ABNORMAL HIGH (ref 70–99)
Potassium: 3.8 mmol/L (ref 3.5–5.1)
Sodium: 142 mmol/L (ref 135–145)
Total Bilirubin: 0.5 mg/dL (ref 0.3–1.2)
Total Protein: 7 g/dL (ref 6.5–8.1)

## 2018-09-01 LAB — IRON AND TIBC
Iron: 104 ug/dL (ref 41–142)
Saturation Ratios: 35 % (ref 21–57)
TIBC: 298 ug/dL (ref 236–444)
UIBC: 194 ug/dL (ref 120–384)

## 2018-09-01 LAB — FERRITIN: Ferritin: 710 ng/mL — ABNORMAL HIGH (ref 11–307)

## 2018-09-01 NOTE — Progress Notes (Signed)
Hematology and Oncology Follow Up Visit  Helen Hardy [id number] Nov 03, 1950 68 y.o. 09/01/2018   Principle Diagnosis:  1. 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 Sickle cell trait 3. Anemia of chronic disease 4. Iron deficiency anemia  Current Therapy:   IV Iron as indicated - last dose received in December 0254 Folic acid OTC PO daily   Interim History:  [name] is here today for follow-up.  As always, she is quite talkative.  She really is enjoying talking about 1 of her grandchildren.  She is now a great grandmother.  She had a great grandson born in September.  As always, her arthritis is really bothering her.  She has horrible arthritis.  She was on medication for this.  She has had no problems with respect to her iron.  We last saw her back in August 2019, her ferritin was 709 with an iron saturation of 45%.  She has had no bleeding.  There is no change in her bowel or bladder habits.  She has had no fever.  She has had no cough.  There is been no issues with influenza.  Overall, her performance status is ECOG 1.    Medications:  Allergies as of 09/01/2018      Reactions   Oxycodone Nausea And Vomiting      Medication List       Accurate as of September 01, 2018 10:10 AM. Always use your most recent med list.        amLODipine 10 MG tablet Commonly known as:  NORVASC Take 10 mg by mouth daily.   celecoxib 200 MG capsule Commonly known as:  CELEBREX Take 200 mg by mouth daily.   erythromycin ophthalmic ointment Place a 1/2 inch ribbon of ointment into the lower eyelid.   estradiol 1 MG tablet Commonly known as:  ESTRACE Take 1 mg by mouth daily.   folic acid 270 MCG tablet Commonly known as:  FOLVITE Take 800 mcg by mouth daily.   gabapentin 300 MG capsule Commonly known as:  NEURONTIN Take 300 mg by mouth 3 (three) times daily.   HYDROcodone-acetaminophen 5-325 MG tablet Commonly known as:   NORCO/VICODIN Take 1 tablet by mouth 3 (three) times daily as needed for moderate pain.   leflunomide 20 MG tablet Commonly known as:  ARAVA Take 20 mg by mouth daily.   methocarbamol 500 MG tablet Commonly known as:  ROBAXIN Take 750 mg by mouth 3 (three) times daily.   multivitamin tablet Take 1 tablet by mouth daily.   ondansetron 8 MG disintegrating tablet Commonly known as:  ZOFRAN ODT 54m ODT q4 hours prn nausea   ORENCIA 250 MG injection Generic drug:  abatacept Inject 250 mg into the vein every 28 (twenty-eight) days.   oxyCODONE-acetaminophen 5-325 MG tablet Commonly known as:  PERCOCET Take 1-2 tablets by mouth every 6 (six) hours as needed.   pantoprazole 40 MG tablet Commonly known as:  PROTONIX Take 40 mg by mouth 2 (two) times daily.   predniSONE 10 MG tablet Commonly known as:  DELTASONE Take 10 mg by mouth daily.   triamcinolone cream 0.1 % Commonly known as:  KENALOG Apply 1 application topically as needed (for rash).   VITAMIN A PO Take 1 tablet by mouth daily.   vitamin B-12 1000 MCG tablet Commonly known as:  CYANOCOBALAMIN Take 1,000 mcg by mouth daily.   Vitamin D3 25 MCG (1000 UT) Caps Take 1 capsule by mouth daily.  Allergies:  Allergies  Allergen Reactions  . Oxycodone Nausea And Vomiting    Past Medical History, Surgical history, Social history, and Family History were reviewed and updated.  Review of Systems: Review of Systems  Constitutional: Negative.   HENT: Negative.   Eyes: Negative.   Respiratory: Negative.   Cardiovascular: Negative.   Gastrointestinal: Negative.   Genitourinary: Negative.   Musculoskeletal: Positive for joint pain.  Skin: Negative.   Neurological: Negative.   Endo/Heme/Allergies: Negative.   Psychiatric/Behavioral: Negative.        Physical Exam:  weight is 236 lb (107 kg). Her temperature is 98.4 F (36.9 C). Her blood pressure is 148/94 (abnormal) and her pulse is 84. Her  respiration is 19 and oxygen saturation is 99%.   Wt Readings from Last 3 Encounters:  09/01/18 236 lb (107 kg)  06/15/18 232 lb (105.2 kg)  03/03/18 224 lb (101.6 kg)    Physical Exam Vitals signs reviewed.  HENT:     Head: Normocephalic and atraumatic.  Eyes:     Pupils: Pupils are equal, round, and reactive to light.  Neck:     Musculoskeletal: Normal range of motion.  Cardiovascular:     Rate and Rhythm: Normal rate and regular rhythm.     Heart sounds: Normal heart sounds.  Pulmonary:     Effort: Pulmonary effort is normal.     Breath sounds: Normal breath sounds.  Abdominal:     General: Bowel sounds are normal.     Palpations: Abdomen is soft.  Musculoskeletal: Normal range of motion.        General: No tenderness or deformity.  Lymphadenopathy:     Cervical: No cervical adenopathy.  Skin:    General: Skin is warm and dry.     Findings: No erythema or rash.  Neurological:     Mental Status: She is alert and oriented to person, place, and time.  Psychiatric:        Behavior: Behavior normal.        Thought Content: Thought content normal.        Judgment: Judgment normal.      Lab Results  Component Value Date   WBC 5.6 09/01/2018   HGB 10.7 (L) 09/01/2018   HCT 32.9 (L) 09/01/2018   MCV 109.7 (H) 09/01/2018   PLT 146 (L) 09/01/2018   Lab Results  Component Value Date   FERRITIN 709 (H) 03/03/2018   IRON 127 03/03/2018   TIBC 284 03/03/2018   UIBC 157 03/03/2018   IRONPCTSAT 45 03/03/2018   Lab Results  Component Value Date   RETICCTPCT 2.4 (H) 03/03/2018   RBC 3.00 (L) 09/01/2018   RETICCTABS 41.0 06/21/2015   No results found for: KPAFRELGTCHN, LAMBDASER, KAPLAMBRATIO No results found for: IGGSERUM, IGA, IGMSERUM No results found for: Odetta Pink, SPEI   Chemistry      Component Value Date/Time   NA 142 09/01/2018 0912   NA 144 04/29/2017 0929   K 3.8 09/01/2018 0912   K 4.0  04/29/2017 0929   CL 103 09/01/2018 0912   CL 103 06/21/2015 0800   CO2 30 09/01/2018 0912   CO2 25 04/29/2017 0929   BUN 21 09/01/2018 0912   BUN 17.7 04/29/2017 0929   CREATININE 1.03 (H) 09/01/2018 0912   CREATININE 0.8 04/29/2017 0929      Component Value Date/Time   CALCIUM 9.7 09/01/2018 0912   CALCIUM 9.3 04/29/2017 0929   ALKPHOS 58 09/01/2018 0912  ALKPHOS 55 04/29/2017 0929   AST 18 09/01/2018 0912   AST 19 04/29/2017 0929   ALT 14 09/01/2018 0912   ALT 16 04/29/2017 0929   BILITOT 0.5 09/01/2018 0912   BILITOT 0.43 04/29/2017 0929      Impression and Plan: [name] is a very pleasant 68 yo African American female with 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 She also has intermittent iron deficiency anemia and sickle cell trait.   She is taking her folic acid daily as prescribed.   We will see what her iron studies show and bring her back in for infusion if needed.   We will plan to see her back in another 4 months for follow-up.     Volanda Napoleon, MD 2/27/202010:10 AM

## 2018-09-02 DIAGNOSIS — M0589 Other rheumatoid arthritis with rheumatoid factor of multiple sites: Secondary | ICD-10-CM | POA: Diagnosis not present

## 2018-09-02 DIAGNOSIS — M0579 Rheumatoid arthritis with rheumatoid factor of multiple sites without organ or systems involvement: Secondary | ICD-10-CM | POA: Diagnosis not present

## 2018-09-06 DIAGNOSIS — R600 Localized edema: Secondary | ICD-10-CM | POA: Diagnosis not present

## 2018-09-06 DIAGNOSIS — R350 Frequency of micturition: Secondary | ICD-10-CM | POA: Diagnosis not present

## 2018-09-06 DIAGNOSIS — M961 Postlaminectomy syndrome, not elsewhere classified: Secondary | ICD-10-CM | POA: Diagnosis not present

## 2018-09-06 DIAGNOSIS — G894 Chronic pain syndrome: Secondary | ICD-10-CM | POA: Diagnosis not present

## 2018-09-06 DIAGNOSIS — Z780 Asymptomatic menopausal state: Secondary | ICD-10-CM | POA: Diagnosis not present

## 2018-09-06 DIAGNOSIS — D899 Disorder involving the immune mechanism, unspecified: Secondary | ICD-10-CM | POA: Diagnosis not present

## 2018-09-06 DIAGNOSIS — Z79899 Other long term (current) drug therapy: Secondary | ICD-10-CM | POA: Diagnosis not present

## 2018-09-06 DIAGNOSIS — Z8572 Personal history of non-Hodgkin lymphomas: Secondary | ICD-10-CM | POA: Diagnosis not present

## 2018-09-06 DIAGNOSIS — R7309 Other abnormal glucose: Secondary | ICD-10-CM | POA: Diagnosis not present

## 2018-09-06 DIAGNOSIS — Z8659 Personal history of other mental and behavioral disorders: Secondary | ICD-10-CM | POA: Diagnosis not present

## 2018-09-06 DIAGNOSIS — M0579 Rheumatoid arthritis with rheumatoid factor of multiple sites without organ or systems involvement: Secondary | ICD-10-CM | POA: Diagnosis not present

## 2018-09-06 DIAGNOSIS — K219 Gastro-esophageal reflux disease without esophagitis: Secondary | ICD-10-CM | POA: Diagnosis not present

## 2018-09-06 DIAGNOSIS — I10 Essential (primary) hypertension: Secondary | ICD-10-CM | POA: Diagnosis not present

## 2018-09-06 DIAGNOSIS — D649 Anemia, unspecified: Secondary | ICD-10-CM | POA: Diagnosis not present

## 2018-09-16 DIAGNOSIS — M5116 Intervertebral disc disorders with radiculopathy, lumbar region: Secondary | ICD-10-CM | POA: Diagnosis not present

## 2018-09-16 DIAGNOSIS — M5136 Other intervertebral disc degeneration, lumbar region: Secondary | ICD-10-CM | POA: Diagnosis not present

## 2018-09-16 DIAGNOSIS — M545 Low back pain: Secondary | ICD-10-CM | POA: Diagnosis not present

## 2018-09-16 DIAGNOSIS — M5416 Radiculopathy, lumbar region: Secondary | ICD-10-CM | POA: Diagnosis not present

## 2018-10-03 DIAGNOSIS — M0579 Rheumatoid arthritis with rheumatoid factor of multiple sites without organ or systems involvement: Secondary | ICD-10-CM | POA: Diagnosis not present

## 2018-10-03 DIAGNOSIS — M0589 Other rheumatoid arthritis with rheumatoid factor of multiple sites: Secondary | ICD-10-CM | POA: Diagnosis not present

## 2018-10-31 DIAGNOSIS — M0589 Other rheumatoid arthritis with rheumatoid factor of multiple sites: Secondary | ICD-10-CM | POA: Diagnosis not present

## 2018-11-29 DIAGNOSIS — M0589 Other rheumatoid arthritis with rheumatoid factor of multiple sites: Secondary | ICD-10-CM | POA: Diagnosis not present

## 2018-11-30 DIAGNOSIS — M25561 Pain in right knee: Secondary | ICD-10-CM | POA: Diagnosis not present

## 2018-12-01 DIAGNOSIS — R600 Localized edema: Secondary | ICD-10-CM | POA: Diagnosis not present

## 2018-12-01 DIAGNOSIS — I872 Venous insufficiency (chronic) (peripheral): Secondary | ICD-10-CM | POA: Diagnosis not present

## 2018-12-22 DIAGNOSIS — M0579 Rheumatoid arthritis with rheumatoid factor of multiple sites without organ or systems involvement: Secondary | ICD-10-CM | POA: Diagnosis not present

## 2018-12-22 DIAGNOSIS — E559 Vitamin D deficiency, unspecified: Secondary | ICD-10-CM | POA: Diagnosis not present

## 2018-12-22 DIAGNOSIS — D573 Sickle-cell trait: Secondary | ICD-10-CM | POA: Diagnosis not present

## 2018-12-22 DIAGNOSIS — H02402 Unspecified ptosis of left eyelid: Secondary | ICD-10-CM | POA: Diagnosis not present

## 2018-12-22 DIAGNOSIS — R600 Localized edema: Secondary | ICD-10-CM | POA: Diagnosis not present

## 2018-12-22 DIAGNOSIS — Z96653 Presence of artificial knee joint, bilateral: Secondary | ICD-10-CM | POA: Diagnosis not present

## 2018-12-22 DIAGNOSIS — R7303 Prediabetes: Secondary | ICD-10-CM | POA: Diagnosis not present

## 2018-12-27 DIAGNOSIS — Z0289 Encounter for other administrative examinations: Secondary | ICD-10-CM | POA: Diagnosis not present

## 2018-12-27 DIAGNOSIS — I10 Essential (primary) hypertension: Secondary | ICD-10-CM | POA: Diagnosis not present

## 2018-12-27 DIAGNOSIS — R600 Localized edema: Secondary | ICD-10-CM | POA: Diagnosis not present

## 2018-12-28 DIAGNOSIS — M0579 Rheumatoid arthritis with rheumatoid factor of multiple sites without organ or systems involvement: Secondary | ICD-10-CM | POA: Diagnosis not present

## 2018-12-28 DIAGNOSIS — M25561 Pain in right knee: Secondary | ICD-10-CM | POA: Diagnosis not present

## 2018-12-28 DIAGNOSIS — S82041K Displaced comminuted fracture of right patella, subsequent encounter for closed fracture with nonunion: Secondary | ICD-10-CM | POA: Diagnosis not present

## 2018-12-28 DIAGNOSIS — M0589 Other rheumatoid arthritis with rheumatoid factor of multiple sites: Secondary | ICD-10-CM | POA: Diagnosis not present

## 2018-12-30 ENCOUNTER — Inpatient Hospital Stay (HOSPITAL_BASED_OUTPATIENT_CLINIC_OR_DEPARTMENT_OTHER): Payer: Medicare Other | Admitting: Hematology & Oncology

## 2018-12-30 ENCOUNTER — Inpatient Hospital Stay: Payer: Medicare Other | Attending: Hematology & Oncology

## 2018-12-30 ENCOUNTER — Other Ambulatory Visit: Payer: Self-pay

## 2018-12-30 ENCOUNTER — Telehealth: Payer: Self-pay | Admitting: Hematology & Oncology

## 2018-12-30 ENCOUNTER — Encounter: Payer: Self-pay | Admitting: Hematology & Oncology

## 2018-12-30 VITALS — BP 137/76 | HR 80 | Temp 97.7°F | Resp 20 | Wt 230.0 lb

## 2018-12-30 DIAGNOSIS — D509 Iron deficiency anemia, unspecified: Secondary | ICD-10-CM | POA: Diagnosis not present

## 2018-12-30 DIAGNOSIS — Z8572 Personal history of non-Hodgkin lymphomas: Secondary | ICD-10-CM | POA: Diagnosis not present

## 2018-12-30 DIAGNOSIS — N2889 Other specified disorders of kidney and ureter: Secondary | ICD-10-CM | POA: Diagnosis not present

## 2018-12-30 DIAGNOSIS — N183 Chronic kidney disease, stage 3 unspecified: Secondary | ICD-10-CM

## 2018-12-30 DIAGNOSIS — M069 Rheumatoid arthritis, unspecified: Secondary | ICD-10-CM | POA: Diagnosis not present

## 2018-12-30 DIAGNOSIS — D573 Sickle-cell trait: Secondary | ICD-10-CM | POA: Insufficient documentation

## 2018-12-30 DIAGNOSIS — D631 Anemia in chronic kidney disease: Secondary | ICD-10-CM | POA: Insufficient documentation

## 2018-12-30 DIAGNOSIS — D500 Iron deficiency anemia secondary to blood loss (chronic): Secondary | ICD-10-CM

## 2018-12-30 DIAGNOSIS — D638 Anemia in other chronic diseases classified elsewhere: Secondary | ICD-10-CM

## 2018-12-30 DIAGNOSIS — Z79899 Other long term (current) drug therapy: Secondary | ICD-10-CM

## 2018-12-30 HISTORY — DX: Anemia in chronic kidney disease: D63.1

## 2018-12-30 HISTORY — DX: Chronic kidney disease, stage 3 unspecified: N18.30

## 2018-12-30 LAB — CBC WITH DIFFERENTIAL (CANCER CENTER ONLY)
Abs Immature Granulocytes: 0.04 10*3/uL (ref 0.00–0.07)
Basophils Absolute: 0 10*3/uL (ref 0.0–0.1)
Basophils Relative: 0 %
Eosinophils Absolute: 0.1 10*3/uL (ref 0.0–0.5)
Eosinophils Relative: 1 %
HCT: 32.4 % — ABNORMAL LOW (ref 36.0–46.0)
Hemoglobin: 10.7 g/dL — ABNORMAL LOW (ref 12.0–15.0)
Immature Granulocytes: 1 %
Lymphocytes Relative: 39 %
Lymphs Abs: 2.3 10*3/uL (ref 0.7–4.0)
MCH: 36 pg — ABNORMAL HIGH (ref 26.0–34.0)
MCHC: 33 g/dL (ref 30.0–36.0)
MCV: 109.1 fL — ABNORMAL HIGH (ref 80.0–100.0)
Monocytes Absolute: 0.6 10*3/uL (ref 0.1–1.0)
Monocytes Relative: 9 %
Neutro Abs: 2.9 10*3/uL (ref 1.7–7.7)
Neutrophils Relative %: 50 %
Platelet Count: 173 10*3/uL (ref 150–400)
RBC: 2.97 MIL/uL — ABNORMAL LOW (ref 3.87–5.11)
RDW: 15.6 % — ABNORMAL HIGH (ref 11.5–15.5)
WBC Count: 6 10*3/uL (ref 4.0–10.5)
nRBC: 0 % (ref 0.0–0.2)

## 2018-12-30 LAB — CMP (CANCER CENTER ONLY)
ALT: 13 U/L (ref 0–44)
AST: 17 U/L (ref 15–41)
Albumin: 4 g/dL (ref 3.5–5.0)
Alkaline Phosphatase: 49 U/L (ref 38–126)
Anion gap: 9 (ref 5–15)
BUN: 34 mg/dL — ABNORMAL HIGH (ref 8–23)
CO2: 31 mmol/L (ref 22–32)
Calcium: 9.5 mg/dL (ref 8.9–10.3)
Chloride: 102 mmol/L (ref 98–111)
Creatinine: 1.34 mg/dL — ABNORMAL HIGH (ref 0.44–1.00)
GFR, Est AFR Am: 47 mL/min — ABNORMAL LOW (ref >60)
GFR, Estimated: 40 mL/min — ABNORMAL LOW (ref >60)
Glucose, Bld: 105 mg/dL — ABNORMAL HIGH (ref 70–99)
Potassium: 3.9 mmol/L (ref 3.5–5.1)
Sodium: 142 mmol/L (ref 135–145)
Total Bilirubin: 0.5 mg/dL (ref 0.3–1.2)
Total Protein: 6.5 g/dL (ref 6.5–8.1)

## 2018-12-30 LAB — IRON AND TIBC
Iron: 115 ug/dL (ref 41–142)
Saturation Ratios: 40 % (ref 21–57)
TIBC: 284 ug/dL (ref 236–444)
UIBC: 169 ug/dL (ref 120–384)

## 2018-12-30 LAB — FERRITIN: Ferritin: 739 ng/mL — ABNORMAL HIGH (ref 11–307)

## 2018-12-30 NOTE — Telephone Encounter (Signed)
lmom for appt 7/24 at 12 pm

## 2018-12-30 NOTE — Progress Notes (Signed)
Hematology and Oncology Follow Up Visit  Helen Hardy [id number] [birth year] 68 y.o. 12/30/2018   Principle Diagnosis:  1. 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 Sickle cell trait 3. 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 Iron deficiency anemia  Current Therapy:   IV Iron as indicated - last dose received in December 2016 Aranesp 300 mcg sq for Hgb < 11 Folic acid 1 mg po daily   Interim History:  [name] is here today for follow-up.  As always, she is quite talkative.  She really is enjoying talking about 1 of her grandchildren.  She is now a great grandmother.  He is 8 months old.  She really is enjoying him.  She is worried about the world right now.  We had a long talk about what is going on in the world.  Of course, our fellowship came through and we had good prayer.  She does have a bad rheumatoid arthritis.  Every now that she will feel nodules on her skin which come and go.  She has had no bleeding.  She has had no change in bowel or bladder habits..  She does have the injections into her back.  Overall, her performance status is  ECOG 1.    Medications:  Allergies as of 12/30/2018      Reactions   Oxycodone Nausea And Vomiting      Medication List       Accurate as of December 30, 2018 10:13 AM. If you have any questions, ask your nurse or doctor.        amLODipine 10 MG tablet Commonly known as: NORVASC Take 10 mg by mouth daily.   celecoxib 200 MG capsule Commonly known as: CELEBREX Take 200 mg by mouth daily.   erythromycin ophthalmic ointment Place a 1/2 inch ribbon of ointment into the lower eyelid.   estradiol 1 MG tablet Commonly known as: ESTRACE Take 1 mg by mouth daily.   folic acid 1 MG tablet Commonly known as: FOLVITE Take 2 mg by mouth daily.   furosemide 40 MG tablet Commonly known as: LASIX TAKE 1 TABLET BY MOUTH ONCE DAILY FOR 30 DAYS   gabapentin 300 MG capsule Commonly known  as: NEURONTIN Take 300 mg by mouth 3 (three) times daily.   HYDROcodone-acetaminophen 5-325 MG tablet Commonly known as: NORCO/VICODIN Take 1 tablet by mouth 3 (three) times daily as needed for moderate pain.   HYDROcodone-acetaminophen 10-325 MG tablet Commonly known as: NORCO Take 1 tablet by mouth 3 (three) times daily as needed.   leflunomide 20 MG tablet Commonly known as: ARAVA Take 20 mg by mouth daily.   methocarbamol 500 MG tablet Commonly known as: ROBAXIN Take 750 mg by mouth 3 (three) times daily.   multivitamin tablet Take 1 tablet by mouth daily.   olmesartan 20 MG tablet Commonly known as: BENICAR Take 20 mg by mouth daily.   ondansetron 8 MG disintegrating tablet Commonly known as: Zofran ODT 46m ODT q4 hours prn nausea   Orencia 250 MG injection Generic drug: abatacept Inject 250 mg into the vein every 28 (twenty-eight) days.   oxyCODONE-acetaminophen 5-325 MG tablet Commonly known as: Percocet Take 1-2 tablets by mouth every 6 (six) hours as needed.   pantoprazole 40 MG tablet Commonly known as: PROTONIX Take 40 mg by mouth 2 (two) times daily.   potassium chloride 10 MEQ tablet Commonly known as: K-DUR 20 mEq daily.   Potassium Chloride ER 20 MEQ  Tbcr TAKE 1 TABLET BY MOUTH ONCE DAILY WITH FOOD FOR 30 DAYS   predniSONE 10 MG tablet Commonly known as: DELTASONE Take 10 mg by mouth daily.   triamcinolone cream 0.1 % Commonly known as: KENALOG Apply 1 application topically as needed (for rash).   VITAMIN A PO Take 1 tablet by mouth daily.   vitamin B-12 1000 MCG tablet Commonly known as: CYANOCOBALAMIN Take 1,000 mcg by mouth daily.   Vitamin D3 25 MCG (1000 UT) Caps Take 2 capsules by mouth daily.       Allergies:  Allergies  Allergen Reactions  . Oxycodone Nausea And Vomiting    Past Medical History, Surgical history, Social history, and Family History were reviewed and updated.  Review of Systems: Review of Systems   Constitutional: Negative.   HENT: Negative.   Eyes: Negative.   Respiratory: Negative.   Cardiovascular: Negative.   Gastrointestinal: Negative.   Genitourinary: Negative.   Musculoskeletal: Positive for joint pain.  Skin: Negative.   Neurological: Negative.   Endo/Heme/Allergies: Negative.   Psychiatric/Behavioral: Negative.        Physical Exam:  weight is 230 lb (104.3 kg). Her oral temperature is 97.7 F (36.5 C). Her blood pressure is 137/76 and her pulse is 80. Her respiration is 20 and oxygen saturation is 100%.   Wt Readings from Last 3 Encounters:  12/30/18 230 lb (104.3 kg)  09/01/18 236 lb (107 kg)  06/15/18 232 lb (105.2 kg)    Physical Exam Vitals signs reviewed.  HENT:     Head: Normocephalic and atraumatic.  Eyes:     Pupils: Pupils are equal, round, and reactive to light.  Neck:     Musculoskeletal: Normal range of motion.  Cardiovascular:     Rate and Rhythm: Normal rate and regular rhythm.     Heart sounds: Normal heart sounds.  Pulmonary:     Effort: Pulmonary effort is normal.     Breath sounds: Normal breath sounds.  Abdominal:     General: Bowel sounds are normal.     Palpations: Abdomen is soft.  Musculoskeletal: Normal range of motion.        General: No tenderness or deformity.  Lymphadenopathy:     Cervical: No cervical adenopathy.  Skin:    General: Skin is warm and dry.     Findings: No erythema or rash.  Neurological:     Mental Status: She is alert and oriented to person, place, and time.  Psychiatric:        Behavior: Behavior normal.        Thought Content: Thought content normal.        Judgment: Judgment normal.      Lab Results  Component Value Date   WBC 6.0 12/30/2018   HGB 10.7 (L) 12/30/2018   HCT 32.4 (L) 12/30/2018   MCV 109.1 (H) 12/30/2018   PLT 173 12/30/2018   Lab Results  Component Value Date   FERRITIN 710 (H) 09/01/2018   IRON 104 09/01/2018   TIBC 298 09/01/2018   UIBC 194 09/01/2018    IRONPCTSAT 35 09/01/2018   Lab Results  Component Value Date   RETICCTPCT 2.4 (H) 03/03/2018   RBC 2.97 (L) 12/30/2018   RETICCTABS 41.0 06/21/2015   No results found for: KPAFRELGTCHN, LAMBDASER, KAPLAMBRATIO No results found for: IGGSERUM, IGA, IGMSERUM No results found for: TOTALPROTELP, ALBUMINELP, A1GS, A2GS, BETS, BETA2SER, GAMS, MSPIKE, SPEI   Chemistry      Component Value Date/Time   NA 142  12/30/2018 0922   NA 144 04/29/2017 0929   K 3.9 12/30/2018 0922   K 4.0 04/29/2017 0929   CL 102 12/30/2018 0922   CL 103 06/21/2015 0800   CO2 31 12/30/2018 0922   CO2 25 04/29/2017 0929   BUN 34 (H) 12/30/2018 0922   BUN 17.7 04/29/2017 0929   CREATININE 1.34 (H) 12/30/2018 0922   CREATININE 0.8 04/29/2017 0929      Component Value Date/Time   CALCIUM 9.5 12/30/2018 0922   CALCIUM 9.3 04/29/2017 0929   ALKPHOS 49 12/30/2018 0922   ALKPHOS 55 04/29/2017 0929   AST 17 12/30/2018 0922   AST 19 04/29/2017 0929   ALT 13 12/30/2018 0922   ALT 16 04/29/2017 0929   BILITOT 0.5 12/30/2018 0922   BILITOT 0.43 04/29/2017 0929      Impression and Plan: Ms. Croy is a very pleasant 68 yo African American female with 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 She also has intermittent iron deficiency anemia and sickle cell trait.   She is taking her folic acid daily as prescribed.   I worry about her renal insufficiency.  We are sending off an erythropoietin level on her so we see how that looks.  It is not surprising if it will be low.  If so, we will see if we cannot get her on Aranesp to try to help with her blood counts.  We will see what her iron studies show and bring her back in for infusion if needed.   We will plan to see her back in another 4 weeks.  I think we have to follow her closely given what is going on with her and with her blood not being as good as I would like.    Volanda Napoleon, MD 6/26/202010:13 AM

## 2018-12-31 LAB — ERYTHROPOIETIN: Erythropoietin: 46 m[IU]/mL — ABNORMAL HIGH (ref 2.6–18.5)

## 2019-01-03 DIAGNOSIS — M961 Postlaminectomy syndrome, not elsewhere classified: Secondary | ICD-10-CM | POA: Diagnosis not present

## 2019-01-03 DIAGNOSIS — G894 Chronic pain syndrome: Secondary | ICD-10-CM | POA: Diagnosis not present

## 2019-01-03 DIAGNOSIS — M5416 Radiculopathy, lumbar region: Secondary | ICD-10-CM | POA: Diagnosis not present

## 2019-01-13 DIAGNOSIS — M5136 Other intervertebral disc degeneration, lumbar region: Secondary | ICD-10-CM | POA: Diagnosis not present

## 2019-01-13 DIAGNOSIS — M5416 Radiculopathy, lumbar region: Secondary | ICD-10-CM | POA: Diagnosis not present

## 2019-01-19 DIAGNOSIS — M65871 Other synovitis and tenosynovitis, right ankle and foot: Secondary | ICD-10-CM | POA: Diagnosis not present

## 2019-01-19 DIAGNOSIS — M12271 Villonodular synovitis (pigmented), right ankle and foot: Secondary | ICD-10-CM | POA: Diagnosis not present

## 2019-01-19 DIAGNOSIS — M19071 Primary osteoarthritis, right ankle and foot: Secondary | ICD-10-CM | POA: Diagnosis not present

## 2019-01-19 DIAGNOSIS — M25571 Pain in right ankle and joints of right foot: Secondary | ICD-10-CM | POA: Diagnosis not present

## 2019-01-19 DIAGNOSIS — M12272 Villonodular synovitis (pigmented), left ankle and foot: Secondary | ICD-10-CM | POA: Diagnosis not present

## 2019-01-19 DIAGNOSIS — M19072 Primary osteoarthritis, left ankle and foot: Secondary | ICD-10-CM | POA: Diagnosis not present

## 2019-01-25 DIAGNOSIS — M0589 Other rheumatoid arthritis with rheumatoid factor of multiple sites: Secondary | ICD-10-CM | POA: Diagnosis not present

## 2019-01-26 ENCOUNTER — Telehealth: Payer: Self-pay | Admitting: *Deleted

## 2019-01-26 DIAGNOSIS — H6123 Impacted cerumen, bilateral: Secondary | ICD-10-CM | POA: Diagnosis not present

## 2019-01-26 NOTE — Telephone Encounter (Signed)
Records faxed to [place] [phone]

## 2019-01-26 NOTE — Telephone Encounter (Signed)
Records faxed to [place] [phone]

## 2019-01-27 ENCOUNTER — Other Ambulatory Visit: Payer: Self-pay

## 2019-01-27 ENCOUNTER — Inpatient Hospital Stay: Payer: Medicare Other

## 2019-01-27 ENCOUNTER — Inpatient Hospital Stay: Payer: Medicare Other | Attending: Hematology & Oncology | Admitting: Hematology & Oncology

## 2019-01-27 ENCOUNTER — Encounter: Payer: Self-pay | Admitting: Hematology & Oncology

## 2019-01-27 VITALS — BP 149/90 | HR 77 | Temp 97.4°F | Resp 20 | Wt 236.0 lb

## 2019-01-27 DIAGNOSIS — D631 Anemia in chronic kidney disease: Secondary | ICD-10-CM

## 2019-01-27 DIAGNOSIS — D500 Iron deficiency anemia secondary to blood loss (chronic): Secondary | ICD-10-CM

## 2019-01-27 DIAGNOSIS — Z8572 Personal history of non-Hodgkin lymphomas: Secondary | ICD-10-CM | POA: Diagnosis not present

## 2019-01-27 DIAGNOSIS — D573 Sickle-cell trait: Secondary | ICD-10-CM | POA: Insufficient documentation

## 2019-01-27 DIAGNOSIS — Z79899 Other long term (current) drug therapy: Secondary | ICD-10-CM | POA: Insufficient documentation

## 2019-01-27 DIAGNOSIS — N183 Chronic kidney disease, stage 3 unspecified: Secondary | ICD-10-CM

## 2019-01-27 DIAGNOSIS — N2889 Other specified disorders of kidney and ureter: Secondary | ICD-10-CM

## 2019-01-27 DIAGNOSIS — M069 Rheumatoid arthritis, unspecified: Secondary | ICD-10-CM | POA: Diagnosis not present

## 2019-01-27 DIAGNOSIS — D509 Iron deficiency anemia, unspecified: Secondary | ICD-10-CM | POA: Diagnosis not present

## 2019-01-27 LAB — CMP (CANCER CENTER ONLY)
ALT: 15 U/L (ref 0–44)
AST: 20 U/L (ref 15–41)
Albumin: 4 g/dL (ref 3.5–5.0)
Alkaline Phosphatase: 49 U/L (ref 38–126)
Anion gap: 8 (ref 5–15)
BUN: 22 mg/dL (ref 8–23)
CO2: 29 mmol/L (ref 22–32)
Calcium: 8.4 mg/dL — ABNORMAL LOW (ref 8.9–10.3)
Chloride: 105 mmol/L (ref 98–111)
Creatinine: 1.05 mg/dL — ABNORMAL HIGH (ref 0.44–1.00)
GFR, Est AFR Am: >60 mL/min (ref >60)
GFR, Estimated: 54 mL/min — ABNORMAL LOW (ref >60)
Glucose, Bld: 121 mg/dL — ABNORMAL HIGH (ref 70–99)
Potassium: 4.7 mmol/L (ref 3.5–5.1)
Sodium: 142 mmol/L (ref 135–145)
Total Bilirubin: 0.5 mg/dL (ref 0.3–1.2)
Total Protein: 6.2 g/dL — ABNORMAL LOW (ref 6.5–8.1)

## 2019-01-27 LAB — CBC WITH DIFFERENTIAL (CANCER CENTER ONLY)
Abs Immature Granulocytes: 0.04 10*3/uL (ref 0.00–0.07)
Basophils Absolute: 0 10*3/uL (ref 0.0–0.1)
Basophils Relative: 0 %
Eosinophils Absolute: 0 10*3/uL (ref 0.0–0.5)
Eosinophils Relative: 1 %
HCT: 32.3 % — ABNORMAL LOW (ref 36.0–46.0)
Hemoglobin: 10.3 g/dL — ABNORMAL LOW (ref 12.0–15.0)
Immature Granulocytes: 1 %
Lymphocytes Relative: 22 %
Lymphs Abs: 1.3 10*3/uL (ref 0.7–4.0)
MCH: 35.3 pg — ABNORMAL HIGH (ref 26.0–34.0)
MCHC: 31.9 g/dL (ref 30.0–36.0)
MCV: 110.6 fL — ABNORMAL HIGH (ref 80.0–100.0)
Monocytes Absolute: 0.5 10*3/uL (ref 0.1–1.0)
Monocytes Relative: 8 %
Neutro Abs: 4 10*3/uL (ref 1.7–7.7)
Neutrophils Relative %: 68 %
Platelet Count: 163 10*3/uL (ref 150–400)
RBC: 2.92 MIL/uL — ABNORMAL LOW (ref 3.87–5.11)
RDW: 15.7 % — ABNORMAL HIGH (ref 11.5–15.5)
WBC Count: 5.9 10*3/uL (ref 4.0–10.5)
nRBC: 0 % (ref 0.0–0.2)

## 2019-01-27 LAB — RETICULOCYTES
Immature Retic Fract: 25.8 % — ABNORMAL HIGH (ref 2.3–15.9)
RBC.: 2.9 MIL/uL — ABNORMAL LOW (ref 3.87–5.11)
Retic Count, Absolute: 62.1 10*3/uL (ref 19.0–186.0)
Retic Ct Pct: 2.1 % (ref 0.4–3.1)

## 2019-01-27 LAB — SAVE SMEAR(SSMR), FOR PROVIDER SLIDE REVIEW

## 2019-01-27 MED ORDER — DARBEPOETIN ALFA 300 MCG/0.6ML IJ SOSY: 300.0000 ug | PREFILLED_SYRINGE | Freq: Once | INTRAMUSCULAR | Status: DC

## 2019-01-27 NOTE — Progress Notes (Signed)
Hematology and Oncology Follow Up Visit  Helen Hardy [id number] May 12, 1951 68 y.o. 01/27/2019   Principle Diagnosis:  1. 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 Sickle cell trait 3. 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 Iron deficiency anemia  Current Therapy:   IV Iron as indicated - last dose received in December 2016 Aranesp 300 mcg sq for Hgb < 11 Folic acid 1 mg po daily   Interim History:  Helen Hardy is here today for follow-up.  She is doing pretty well.  She is worried about her kidney function.  She was told that her kidneys were not working all that well.  I know that when we last saw her, her creatinine was 1.34.  Today, the gravity is 1.05.  She is on some different medications.  She does need Aranesp.  Her iron studies have all look pretty good.  Her erythropoietin level is 46.  This, I think, is low for her degree of anemia.  We will try to give her Aranesp today.  Hopefully, we can get this approved.  She has had no bleeding.  She has had no nausea or vomiting.  Her biggest clinical issue is her arthritis.  She has bad rheumatoid arthritis.    Overall, her performance status is  ECOG 1.    Medications:  Allergies as of 01/27/2019      Reactions   Oxycodone Nausea And Vomiting      Medication List       Accurate as of January 27, 2019  1:32 PM. If you have any questions, ask your nurse or doctor.        amLODipine 10 MG tablet Commonly known as: NORVASC Take 10 mg by mouth daily.   amoxicillin 500 MG tablet Commonly known as: AMOXIL Take 1,000 mg by mouth 3 (three) times daily.   celecoxib 200 MG capsule Commonly known as: CELEBREX Take 200 mg by mouth daily.   erythromycin ophthalmic ointment Place a 1/2 inch ribbon of ointment into the lower eyelid.   estradiol 1 MG tablet Commonly known as: ESTRACE Take 1 mg by mouth daily.   folic acid 1 MG tablet Commonly known as: FOLVITE Take 2 mg by  mouth daily.   furosemide 40 MG tablet Commonly known as: LASIX TAKE 1 TABLET BY MOUTH ONCE DAILY FOR 30 DAYS   gabapentin 300 MG capsule Commonly known as: NEURONTIN Take 300 mg by mouth 3 (three) times daily.   HYDROcodone-acetaminophen 5-325 MG tablet Commonly known as: NORCO/VICODIN Take 1 tablet by mouth 3 (three) times daily as needed for moderate pain.   HYDROcodone-acetaminophen 10-325 MG tablet Commonly known as: NORCO Take 1 tablet by mouth 3 (three) times daily as needed.   leflunomide 20 MG tablet Commonly known as: ARAVA Take 20 mg by mouth daily.   methocarbamol 500 MG tablet Commonly known as: ROBAXIN Take 750 mg by mouth 3 (three) times daily.   multivitamin tablet Take 1 tablet by mouth daily.   olmesartan 20 MG tablet Commonly known as: BENICAR Take 20 mg by mouth daily.   ondansetron 8 MG disintegrating tablet Commonly known as: Zofran ODT 22m ODT q4 hours prn nausea   Orencia 250 MG injection Generic drug: abatacept Inject 250 mg into the vein every 28 (twenty-eight) days.   oxyCODONE-acetaminophen 5-325 MG tablet Commonly known as: Percocet Take 1-2 tablets by mouth every 6 (six) hours as needed.   pantoprazole 40 MG tablet Commonly known as: PROTONIX Take  40 mg by mouth 2 (two) times daily.   potassium chloride 10 MEQ tablet Commonly known as: K-DUR 20 mEq daily.   Potassium Chloride ER 20 MEQ Tbcr TAKE 1 TABLET BY MOUTH ONCE DAILY WITH FOOD FOR 30 DAYS   predniSONE 10 MG tablet Commonly known as: DELTASONE Take 10 mg by mouth daily.   triamcinolone cream 0.1 % Commonly known as: KENALOG Apply 1 application topically as needed (for rash).   VITAMIN A PO Take 1 tablet by mouth daily.   vitamin B-12 1000 MCG tablet Commonly known as: CYANOCOBALAMIN Take 1,000 mcg by mouth daily.   Vitamin D3 25 MCG (1000 UT) Caps Take 2 capsules by mouth daily.       Allergies:  Allergies  Allergen Reactions  . Oxycodone Nausea And  Vomiting    Past Medical History, Surgical history, Social history, and Family History were reviewed and updated.  Review of Systems: Review of Systems  Constitutional: Negative.   HENT: Negative.   Eyes: Negative.   Respiratory: Negative.   Cardiovascular: Negative.   Gastrointestinal: Negative.   Genitourinary: Negative.   Musculoskeletal: Positive for joint pain.  Skin: Negative.   Neurological: Negative.   Endo/Heme/Allergies: Negative.   Psychiatric/Behavioral: Negative.        Physical Exam:  weight is 236 lb (107 kg). Her oral temperature is 97.4 F (36.3 C) (abnormal). Her blood pressure is 149/90 (abnormal) and her pulse is 77. Her respiration is 20 and oxygen saturation is 100%.   Wt Readings from Last 3 Encounters:  01/27/19 236 lb (107 kg)  12/30/18 230 lb (104.3 kg)  09/01/18 236 lb (107 kg)    Physical Exam Vitals signs reviewed.  HENT:     Head: Normocephalic and atraumatic.  Eyes:     Pupils: Pupils are equal, round, and reactive to light.  Neck:     Musculoskeletal: Normal range of motion.  Cardiovascular:     Rate and Rhythm: Normal rate and regular rhythm.     Heart sounds: Normal heart sounds.  Pulmonary:     Effort: Pulmonary effort is normal.     Breath sounds: Normal breath sounds.  Abdominal:     General: Bowel sounds are normal.     Palpations: Abdomen is soft.  Musculoskeletal: Normal range of motion.        General: No tenderness or deformity.  Lymphadenopathy:     Cervical: No cervical adenopathy.  Skin:    General: Skin is warm and dry.     Findings: No erythema or rash.  Neurological:     Mental Status: She is alert and oriented to person, place, and time.  Psychiatric:        Behavior: Behavior normal.        Thought Content: Thought content normal.        Judgment: Judgment normal.      Lab Results  Component Value Date   WBC 5.9 01/27/2019   HGB 10.3 (L) 01/27/2019   HCT 32.3 (L) 01/27/2019   MCV 110.6 (H)  01/27/2019   PLT 163 01/27/2019   Lab Results  Component Value Date   FERRITIN 739 (H) 12/30/2018   IRON 115 12/30/2018   TIBC 284 12/30/2018   UIBC 169 12/30/2018   IRONPCTSAT 40 12/30/2018   Lab Results  Component Value Date   RETICCTPCT 2.1 01/27/2019   RBC 2.90 (L) 01/27/2019   RETICCTABS 41.0 06/21/2015   No results found for: KPAFRELGTCHN, LAMBDASER, KAPLAMBRATIO No results found for: IGGSERUM,  IGA, IGMSERUM No results found for: Odetta Pink, SPEI   Chemistry      Component Value Date/Time   NA 142 01/27/2019 1215   NA 144 04/29/2017 0929   K 4.7 01/27/2019 1215   K 4.0 04/29/2017 0929   CL 105 01/27/2019 1215   CL 103 06/21/2015 0800   CO2 29 01/27/2019 1215   CO2 25 04/29/2017 0929   BUN 22 01/27/2019 1215   BUN 17.7 04/29/2017 0929   CREATININE 1.05 (H) 01/27/2019 1215   CREATININE 0.8 04/29/2017 0929      Component Value Date/Time   CALCIUM 8.4 (L) 01/27/2019 1215   CALCIUM 9.3 04/29/2017 0929   ALKPHOS 49 01/27/2019 1215   ALKPHOS 55 04/29/2017 0929   AST 20 01/27/2019 1215   AST 19 04/29/2017 0929   ALT 15 01/27/2019 1215   ALT 16 04/29/2017 0929   BILITOT 0.5 01/27/2019 1215   BILITOT 0.43 04/29/2017 0929      Impression and Plan: Ms. Callan is a very pleasant 68 yo African American female with 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 She also has intermittent iron deficiency anemia and sickle cell trait.   She is taking her folic acid daily as prescribed.   I we will have to give her Aranesp when we see her back.  Unfortunately, we cannot get this approved as of yet for today.  I do not want to wait around the office.  We will have her come back in about 3 weeks or so.  We will continue to monitor her kidney function and her iron studies along with her hemoglobin.  Volanda Napoleon, MD 7/24/20201:32 PM

## 2019-01-30 ENCOUNTER — Telehealth: Payer: Self-pay | Admitting: Hematology & Oncology

## 2019-01-30 LAB — IRON AND TIBC
Iron: 51 ug/dL (ref 41–142)
Saturation Ratios: 18 % — ABNORMAL LOW (ref 21–57)
TIBC: 291 ug/dL (ref 236–444)
UIBC: 240 ug/dL (ref 120–384)

## 2019-01-30 LAB — FERRITIN: Ferritin: 852 ng/mL — ABNORMAL HIGH (ref 11–307)

## 2019-01-30 NOTE — Telephone Encounter (Signed)
lmom to inform patient of iron appt 7/29 at 130 pm per 7/26 result note

## 2019-02-01 ENCOUNTER — Other Ambulatory Visit: Payer: Self-pay

## 2019-02-01 ENCOUNTER — Inpatient Hospital Stay: Payer: Medicare Other

## 2019-02-01 VITALS — BP 120/85 | HR 74 | Temp 97.1°F | Resp 18

## 2019-02-01 DIAGNOSIS — D573 Sickle-cell trait: Secondary | ICD-10-CM | POA: Diagnosis not present

## 2019-02-01 DIAGNOSIS — D509 Iron deficiency anemia, unspecified: Secondary | ICD-10-CM | POA: Diagnosis not present

## 2019-02-01 DIAGNOSIS — Z8572 Personal history of non-Hodgkin lymphomas: Secondary | ICD-10-CM | POA: Diagnosis not present

## 2019-02-01 DIAGNOSIS — M069 Rheumatoid arthritis, unspecified: Secondary | ICD-10-CM | POA: Diagnosis not present

## 2019-02-01 DIAGNOSIS — Z79899 Other long term (current) drug therapy: Secondary | ICD-10-CM | POA: Diagnosis not present

## 2019-02-01 DIAGNOSIS — N2889 Other specified disorders of kidney and ureter: Secondary | ICD-10-CM | POA: Diagnosis not present

## 2019-02-01 DIAGNOSIS — D500 Iron deficiency anemia secondary to blood loss (chronic): Secondary | ICD-10-CM

## 2019-02-01 DIAGNOSIS — K909 Intestinal malabsorption, unspecified: Secondary | ICD-10-CM

## 2019-02-01 MED ORDER — SODIUM CHLORIDE 0.9 % IV SOLN
Freq: Once | INTRAVENOUS | Status: AC
  Administered 2019-02-01: 14:00:00 via INTRAVENOUS
  Filled 2019-02-01: qty 250

## 2019-02-01 MED ORDER — DARBEPOETIN ALFA 300 MCG/0.6ML IJ SOSY: 300.0000 ug | PREFILLED_SYRINGE | Freq: Once | INTRAMUSCULAR | Status: DC

## 2019-02-01 MED ORDER — SODIUM CHLORIDE 0.9 % IV SOLN
510.0000 mg | Freq: Once | INTRAVENOUS | Status: AC
  Administered 2019-02-01: 510 mg via INTRAVENOUS
  Filled 2019-02-01: qty 510

## 2019-02-01 NOTE — Patient Instructions (Signed)

## 2019-02-02 DIAGNOSIS — I10 Essential (primary) hypertension: Secondary | ICD-10-CM | POA: Diagnosis not present

## 2019-02-02 DIAGNOSIS — N183 Chronic kidney disease, stage 3 (moderate): Secondary | ICD-10-CM | POA: Diagnosis not present

## 2019-02-03 DIAGNOSIS — R51 Headache: Secondary | ICD-10-CM | POA: Diagnosis not present

## 2019-02-03 DIAGNOSIS — R42 Dizziness and giddiness: Secondary | ICD-10-CM | POA: Diagnosis not present

## 2019-02-03 DIAGNOSIS — J310 Chronic rhinitis: Secondary | ICD-10-CM | POA: Diagnosis not present

## 2019-02-03 DIAGNOSIS — H9209 Otalgia, unspecified ear: Secondary | ICD-10-CM | POA: Diagnosis not present

## 2019-02-03 DIAGNOSIS — J343 Hypertrophy of nasal turbinates: Secondary | ICD-10-CM | POA: Diagnosis not present

## 2019-02-09 DIAGNOSIS — M65871 Other synovitis and tenosynovitis, right ankle and foot: Secondary | ICD-10-CM | POA: Diagnosis not present

## 2019-02-09 DIAGNOSIS — M25571 Pain in right ankle and joints of right foot: Secondary | ICD-10-CM | POA: Diagnosis not present

## 2019-02-10 DIAGNOSIS — I10 Essential (primary) hypertension: Secondary | ICD-10-CM | POA: Diagnosis not present

## 2019-02-10 DIAGNOSIS — N183 Chronic kidney disease, stage 3 (moderate): Secondary | ICD-10-CM | POA: Diagnosis not present

## 2019-02-22 DIAGNOSIS — M0589 Other rheumatoid arthritis with rheumatoid factor of multiple sites: Secondary | ICD-10-CM | POA: Diagnosis not present

## 2019-02-24 ENCOUNTER — Other Ambulatory Visit: Payer: Self-pay

## 2019-02-24 ENCOUNTER — Inpatient Hospital Stay: Payer: Medicare Other

## 2019-02-24 ENCOUNTER — Inpatient Hospital Stay: Payer: Medicare Other | Attending: Hematology & Oncology | Admitting: Hematology & Oncology

## 2019-02-24 ENCOUNTER — Encounter: Payer: Self-pay | Admitting: Hematology & Oncology

## 2019-02-24 VITALS — BP 158/98 | HR 70 | Temp 97.1°F | Resp 19 | Ht 60.0 in | Wt 237.1 lb

## 2019-02-24 DIAGNOSIS — Z79899 Other long term (current) drug therapy: Secondary | ICD-10-CM | POA: Insufficient documentation

## 2019-02-24 DIAGNOSIS — E611 Iron deficiency: Secondary | ICD-10-CM | POA: Insufficient documentation

## 2019-02-24 DIAGNOSIS — N183 Chronic kidney disease, stage 3 unspecified: Secondary | ICD-10-CM

## 2019-02-24 DIAGNOSIS — K909 Intestinal malabsorption, unspecified: Secondary | ICD-10-CM

## 2019-02-24 DIAGNOSIS — D631 Anemia in chronic kidney disease: Secondary | ICD-10-CM | POA: Diagnosis not present

## 2019-02-24 DIAGNOSIS — D500 Iron deficiency anemia secondary to blood loss (chronic): Secondary | ICD-10-CM

## 2019-02-24 DIAGNOSIS — M069 Rheumatoid arthritis, unspecified: Secondary | ICD-10-CM | POA: Diagnosis not present

## 2019-02-24 DIAGNOSIS — Z8572 Personal history of non-Hodgkin lymphomas: Secondary | ICD-10-CM | POA: Insufficient documentation

## 2019-02-24 DIAGNOSIS — D509 Iron deficiency anemia, unspecified: Secondary | ICD-10-CM | POA: Diagnosis not present

## 2019-02-24 DIAGNOSIS — N189 Chronic kidney disease, unspecified: Secondary | ICD-10-CM | POA: Diagnosis not present

## 2019-02-24 DIAGNOSIS — D573 Sickle-cell trait: Secondary | ICD-10-CM | POA: Insufficient documentation

## 2019-02-24 DIAGNOSIS — D638 Anemia in other chronic diseases classified elsewhere: Secondary | ICD-10-CM | POA: Diagnosis not present

## 2019-02-24 LAB — CBC WITH DIFFERENTIAL (CANCER CENTER ONLY)
Abs Immature Granulocytes: 0.04 10*3/uL (ref 0.00–0.07)
Basophils Absolute: 0 10*3/uL (ref 0.0–0.1)
Basophils Relative: 0 %
Eosinophils Absolute: 0 10*3/uL (ref 0.0–0.5)
Eosinophils Relative: 0 %
HCT: 30.6 % — ABNORMAL LOW (ref 36.0–46.0)
Hemoglobin: 9.7 g/dL — ABNORMAL LOW (ref 12.0–15.0)
Immature Granulocytes: 1 %
Lymphocytes Relative: 35 %
Lymphs Abs: 1.7 10*3/uL (ref 0.7–4.0)
MCH: 35.4 pg — ABNORMAL HIGH (ref 26.0–34.0)
MCHC: 31.7 g/dL (ref 30.0–36.0)
MCV: 111.7 fL — ABNORMAL HIGH (ref 80.0–100.0)
Monocytes Absolute: 0.3 10*3/uL (ref 0.1–1.0)
Monocytes Relative: 6 %
Neutro Abs: 2.9 10*3/uL (ref 1.7–7.7)
Neutrophils Relative %: 58 %
Platelet Count: 135 10*3/uL — ABNORMAL LOW (ref 150–400)
RBC: 2.74 MIL/uL — ABNORMAL LOW (ref 3.87–5.11)
RDW: 16.5 % — ABNORMAL HIGH (ref 11.5–15.5)
WBC Count: 5 10*3/uL (ref 4.0–10.5)
nRBC: 0 % (ref 0.0–0.2)

## 2019-02-24 LAB — CMP (CANCER CENTER ONLY)
ALT: 19 U/L (ref 0–44)
AST: 22 U/L (ref 15–41)
Albumin: 3.9 g/dL (ref 3.5–5.0)
Alkaline Phosphatase: 53 U/L (ref 38–126)
Anion gap: 7 (ref 5–15)
BUN: 17 mg/dL (ref 8–23)
CO2: 28 mmol/L (ref 22–32)
Calcium: 9.1 mg/dL (ref 8.9–10.3)
Chloride: 109 mmol/L (ref 98–111)
Creatinine: 0.93 mg/dL (ref 0.44–1.00)
GFR, Est AFR Am: >60 mL/min (ref >60)
GFR, Estimated: >60 mL/min (ref >60)
Glucose, Bld: 105 mg/dL — ABNORMAL HIGH (ref 70–99)
Potassium: 4.8 mmol/L (ref 3.5–5.1)
Sodium: 144 mmol/L (ref 135–145)
Total Bilirubin: 0.4 mg/dL (ref 0.3–1.2)
Total Protein: 6.5 g/dL (ref 6.5–8.1)

## 2019-02-24 LAB — RETICULOCYTES
Immature Retic Fract: 24.4 % — ABNORMAL HIGH (ref 2.3–15.9)
RBC.: 2.76 MIL/uL — ABNORMAL LOW (ref 3.87–5.11)
Retic Count, Absolute: 72.3 10*3/uL (ref 19.0–186.0)
Retic Ct Pct: 2.6 % (ref 0.4–3.1)

## 2019-02-24 MED ORDER — EPOETIN ALFA-EPBX 40000 UNIT/ML IJ SOLN
40000.0000 [IU] | Freq: Once | INTRAMUSCULAR | Status: AC
  Administered 2019-02-24: 40000 [IU] via SUBCUTANEOUS
  Filled 2019-02-24: qty 1

## 2019-02-24 NOTE — Patient Instructions (Signed)

## 2019-02-24 NOTE — Progress Notes (Signed)
Hematology and Oncology Follow Up Visit  Helen Hardy [id number] [birth year] 68 y.o. 02/24/2019   Principle Diagnosis:  1. 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 Sickle cell trait 3. 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 Iron deficiency anemia  Current Therapy:   IV Iron as indicated - last dose received in December 2016 Aranesp 300 mcg sq for Hgb < 11 Folic acid 1 mg po daily   Interim History:  Helen Hardy is here today for follow-up.  She is doing pretty well.  She is worried about her kidney function.  She was told that her kidneys were not working all that well.  I know that when we last saw her, her creatinine was 1.34.  Today, the gravity is 1.05.  She is on some different medications.  She does need Aranesp.  Her iron studies have all look pretty good.  Her erythropoietin level is 46.  This, I think, is low for her degree of anemia.  We will try to give her Aranesp today.    She has had no bleeding.  She has had no nausea or vomiting.  Her biggest clinical issue is her arthritis.  She has bad rheumatoid arthritis.    Overall, her performance status is  ECOG 1.    Medications:  Allergies as of 02/24/2019      Reactions   Oxycodone Nausea And Vomiting      Medication List       Accurate as of February 24, 2019  2:35 PM. If you have any questions, ask your nurse or doctor.        amLODipine 10 MG tablet Commonly known as: NORVASC Take 10 mg by mouth daily.   amoxicillin 500 MG tablet Commonly known as: AMOXIL Take 1,000 mg by mouth 3 (three) times daily.   celecoxib 200 MG capsule Commonly known as: CELEBREX Take 200 mg by mouth daily.   erythromycin ophthalmic ointment Place a 1/2 inch ribbon of ointment into the lower eyelid.   estradiol 1 MG tablet Commonly known as: ESTRACE Take 1 mg by mouth daily.   folic acid 1 MG tablet Commonly known as: FOLVITE Take 2 mg by mouth daily.   furosemide 40 MG  tablet Commonly known as: LASIX TAKE 1 TABLET BY MOUTH ONCE DAILY FOR 30 DAYS   gabapentin 300 MG capsule Commonly known as: NEURONTIN Take 300 mg by mouth 3 (three) times daily.   HYDROcodone-acetaminophen 5-325 MG tablet Commonly known as: NORCO/VICODIN Take 1 tablet by mouth 3 (three) times daily as needed for moderate pain.   HYDROcodone-acetaminophen 10-325 MG tablet Commonly known as: NORCO Take 1 tablet by mouth 3 (three) times daily as needed.   leflunomide 20 MG tablet Commonly known as: ARAVA Take 20 mg by mouth daily.   methocarbamol 500 MG tablet Commonly known as: ROBAXIN Take 750 mg by mouth 3 (three) times daily.   multivitamin tablet Take 1 tablet by mouth daily.   olmesartan 20 MG tablet Commonly known as: BENICAR Take 20 mg by mouth daily.   ondansetron 8 MG disintegrating tablet Commonly known as: Zofran ODT 67m ODT q4 hours prn nausea   Orencia 250 MG injection Generic drug: abatacept Inject 250 mg into the vein every 28 (twenty-eight) days.   oxyCODONE-acetaminophen 5-325 MG tablet Commonly known as: Percocet Take 1-2 tablets by mouth every 6 (six) hours as needed.   pantoprazole 40 MG tablet Commonly known as: PROTONIX Take 40 mg by mouth 2 (  two) times daily.   potassium chloride 10 MEQ tablet Commonly known as: K-DUR 20 mEq daily.   Potassium Chloride ER 20 MEQ Tbcr TAKE 1 TABLET BY MOUTH ONCE DAILY WITH FOOD FOR 30 DAYS   predniSONE 10 MG tablet Commonly known as: DELTASONE Take 10 mg by mouth daily.   triamcinolone cream 0.1 % Commonly known as: KENALOG Apply 1 application topically as needed (for rash).   VITAMIN A PO Take 1 tablet by mouth daily.   vitamin B-12 1000 MCG tablet Commonly known as: CYANOCOBALAMIN Take 1,000 mcg by mouth daily.   Vitamin D3 25 MCG (1000 UT) Caps Take 2 capsules by mouth daily.       Allergies:  Allergies  Allergen Reactions  . Oxycodone Nausea And Vomiting    Past Medical History,  Surgical history, Social history, and Family History were reviewed and updated.  Review of Systems: Review of Systems  Constitutional: Negative.   HENT: Negative.   Eyes: Negative.   Respiratory: Negative.   Cardiovascular: Negative.   Gastrointestinal: Negative.   Genitourinary: Negative.   Musculoskeletal: Positive for joint pain.  Skin: Negative.   Neurological: Negative.   Endo/Heme/Allergies: Negative.   Psychiatric/Behavioral: Negative.        Physical Exam:  height is 5' (1.524 m) and weight is 237 lb 1.9 oz (107.6 kg). Her temporal temperature is 97.1 F (36.2 C) (abnormal). Her blood pressure is 158/98 (abnormal) and her pulse is 70. Her respiration is 19 and oxygen saturation is 100%.   Wt Readings from Last 3 Encounters:  02/24/19 237 lb 1.9 oz (107.6 kg)  01/27/19 236 lb (107 kg)  12/30/18 230 lb (104.3 kg)    Physical Exam Vitals signs reviewed.  HENT:     Head: Normocephalic and atraumatic.  Eyes:     Pupils: Pupils are equal, round, and reactive to light.  Neck:     Musculoskeletal: Normal range of motion.  Cardiovascular:     Rate and Rhythm: Normal rate and regular rhythm.     Heart sounds: Normal heart sounds.  Pulmonary:     Effort: Pulmonary effort is normal.     Breath sounds: Normal breath sounds.  Abdominal:     General: Bowel sounds are normal.     Palpations: Abdomen is soft.  Musculoskeletal: Normal range of motion.        General: No tenderness or deformity.  Lymphadenopathy:     Cervical: No cervical adenopathy.  Skin:    General: Skin is warm and dry.     Findings: No erythema or rash.  Neurological:     Mental Status: She is alert and oriented to person, place, and time.  Psychiatric:        Behavior: Behavior normal.        Thought Content: Thought content normal.        Judgment: Judgment normal.      Lab Results  Component Value Date   WBC 5.0 02/24/2019   HGB 9.7 (L) 02/24/2019   HCT 30.6 (L) 02/24/2019   MCV 111.7  (H) 02/24/2019   PLT 135 (L) 02/24/2019   Lab Results  Component Value Date   FERRITIN 852 (H) 01/27/2019   IRON 51 01/27/2019   TIBC 291 01/27/2019   UIBC 240 01/27/2019   IRONPCTSAT 18 (L) 01/27/2019   Lab Results  Component Value Date   RETICCTPCT 2.6 02/24/2019   RBC 2.76 (L) 02/24/2019   RETICCTABS 41.0 06/21/2015   No results found for: KPAFRELGTCHN,  LAMBDASER, KAPLAMBRATIO No results found for: IGGSERUM, IGA, IGMSERUM No results found for: Odetta Pink, SPEI   Chemistry      Component Value Date/Time   NA 144 02/24/2019 1331   NA 144 04/29/2017 0929   K 4.8 02/24/2019 1331   K 4.0 04/29/2017 0929   CL 109 02/24/2019 1331   CL 103 06/21/2015 0800   CO2 28 02/24/2019 1331   CO2 25 04/29/2017 0929   BUN 17 02/24/2019 1331   BUN 17.7 04/29/2017 0929   CREATININE 0.93 02/24/2019 1331   CREATININE 0.8 04/29/2017 0929      Component Value Date/Time   CALCIUM 9.1 02/24/2019 1331   CALCIUM 9.3 04/29/2017 0929   ALKPHOS 53 02/24/2019 1331   ALKPHOS 55 04/29/2017 0929   AST 22 02/24/2019 1331   AST 19 04/29/2017 0929   ALT 19 02/24/2019 1331   ALT 16 04/29/2017 0929   BILITOT 0.4 02/24/2019 1331   BILITOT 0.43 04/29/2017 0929      Impression and Plan: [name] is a very pleasant 68 yo African American female with 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 She also has intermittent iron deficiency anemia and sickle cell trait.   She is taking her folic acid daily as prescribed.   I have to believe that the Aranesp will get her hemoglobin up.  We will have her come back in about 3 weeks or so.  We will continue to monitor her kidney function and her iron studies along with her hemoglobin.  Volanda Napoleon, MD 8/21/20202:35 PM

## 2019-02-27 LAB — FERRITIN: Ferritin: 2122 ng/mL — ABNORMAL HIGH (ref 11–307)

## 2019-02-27 LAB — IRON AND TIBC
Iron: 84 ug/dL (ref 41–142)
Saturation Ratios: 34 % (ref 21–57)
TIBC: 243 ug/dL (ref 236–444)
UIBC: 159 ug/dL (ref 120–384)

## 2019-03-01 DIAGNOSIS — Z23 Encounter for immunization: Secondary | ICD-10-CM | POA: Diagnosis not present

## 2019-03-01 DIAGNOSIS — I10 Essential (primary) hypertension: Secondary | ICD-10-CM | POA: Diagnosis not present

## 2019-03-01 DIAGNOSIS — K219 Gastro-esophageal reflux disease without esophagitis: Secondary | ICD-10-CM | POA: Diagnosis not present

## 2019-03-09 DIAGNOSIS — M25571 Pain in right ankle and joints of right foot: Secondary | ICD-10-CM | POA: Diagnosis not present

## 2019-03-09 DIAGNOSIS — M65872 Other synovitis and tenosynovitis, left ankle and foot: Secondary | ICD-10-CM | POA: Diagnosis not present

## 2019-03-09 DIAGNOSIS — M65871 Other synovitis and tenosynovitis, right ankle and foot: Secondary | ICD-10-CM | POA: Diagnosis not present

## 2019-03-17 DIAGNOSIS — J343 Hypertrophy of nasal turbinates: Secondary | ICD-10-CM | POA: Diagnosis not present

## 2019-03-17 DIAGNOSIS — J310 Chronic rhinitis: Secondary | ICD-10-CM | POA: Diagnosis not present

## 2019-03-17 DIAGNOSIS — R42 Dizziness and giddiness: Secondary | ICD-10-CM | POA: Diagnosis not present

## 2019-03-17 DIAGNOSIS — R51 Headache: Secondary | ICD-10-CM | POA: Diagnosis not present

## 2019-03-22 ENCOUNTER — Inpatient Hospital Stay: Payer: Medicare Other

## 2019-03-22 ENCOUNTER — Telehealth: Payer: Self-pay | Admitting: Hematology & Oncology

## 2019-03-22 ENCOUNTER — Other Ambulatory Visit: Payer: Self-pay

## 2019-03-22 ENCOUNTER — Inpatient Hospital Stay: Payer: Medicare Other | Attending: Hematology & Oncology | Admitting: Hematology & Oncology

## 2019-03-22 VITALS — BP 156/84 | HR 66 | Temp 97.4°F | Resp 18 | Wt 235.0 lb

## 2019-03-22 DIAGNOSIS — D500 Iron deficiency anemia secondary to blood loss (chronic): Secondary | ICD-10-CM

## 2019-03-22 DIAGNOSIS — N183 Chronic kidney disease, stage 3 unspecified: Secondary | ICD-10-CM

## 2019-03-22 DIAGNOSIS — Z8572 Personal history of non-Hodgkin lymphomas: Secondary | ICD-10-CM | POA: Diagnosis not present

## 2019-03-22 DIAGNOSIS — D631 Anemia in chronic kidney disease: Secondary | ICD-10-CM | POA: Diagnosis not present

## 2019-03-22 DIAGNOSIS — Z79899 Other long term (current) drug therapy: Secondary | ICD-10-CM | POA: Diagnosis not present

## 2019-03-22 DIAGNOSIS — I129 Hypertensive chronic kidney disease with stage 1 through stage 4 chronic kidney disease, or unspecified chronic kidney disease: Secondary | ICD-10-CM | POA: Diagnosis not present

## 2019-03-22 DIAGNOSIS — N189 Chronic kidney disease, unspecified: Secondary | ICD-10-CM | POA: Diagnosis not present

## 2019-03-22 DIAGNOSIS — M069 Rheumatoid arthritis, unspecified: Secondary | ICD-10-CM | POA: Insufficient documentation

## 2019-03-22 DIAGNOSIS — D573 Sickle-cell trait: Secondary | ICD-10-CM | POA: Insufficient documentation

## 2019-03-22 DIAGNOSIS — K909 Intestinal malabsorption, unspecified: Secondary | ICD-10-CM

## 2019-03-22 LAB — CBC WITH DIFFERENTIAL (CANCER CENTER ONLY)
Abs Immature Granulocytes: 0.05 10*3/uL (ref 0.00–0.07)
Basophils Absolute: 0 10*3/uL (ref 0.0–0.1)
Basophils Relative: 0 %
Eosinophils Absolute: 0 10*3/uL (ref 0.0–0.5)
Eosinophils Relative: 0 %
HCT: 31.9 % — ABNORMAL LOW (ref 36.0–46.0)
Hemoglobin: 10.2 g/dL — ABNORMAL LOW (ref 12.0–15.0)
Immature Granulocytes: 1 %
Lymphocytes Relative: 31 %
Lymphs Abs: 1.7 10*3/uL (ref 0.7–4.0)
MCH: 35.8 pg — ABNORMAL HIGH (ref 26.0–34.0)
MCHC: 32 g/dL (ref 30.0–36.0)
MCV: 111.9 fL — ABNORMAL HIGH (ref 80.0–100.0)
Monocytes Absolute: 0.4 10*3/uL (ref 0.1–1.0)
Monocytes Relative: 7 %
Neutro Abs: 3.4 10*3/uL (ref 1.7–7.7)
Neutrophils Relative %: 61 %
Platelet Count: 154 10*3/uL (ref 150–400)
RBC: 2.85 MIL/uL — ABNORMAL LOW (ref 3.87–5.11)
RDW: 16.7 % — ABNORMAL HIGH (ref 11.5–15.5)
WBC Count: 5.6 10*3/uL (ref 4.0–10.5)
nRBC: 0 % (ref 0.0–0.2)

## 2019-03-22 LAB — CMP (CANCER CENTER ONLY)
ALT: 16 U/L (ref 0–44)
AST: 21 U/L (ref 15–41)
Albumin: 4.1 g/dL (ref 3.5–5.0)
Alkaline Phosphatase: 55 U/L (ref 38–126)
Anion gap: 9 (ref 5–15)
BUN: 19 mg/dL (ref 8–23)
CO2: 28 mmol/L (ref 22–32)
Calcium: 9.5 mg/dL (ref 8.9–10.3)
Chloride: 106 mmol/L (ref 98–111)
Creatinine: 1.01 mg/dL — ABNORMAL HIGH (ref 0.44–1.00)
GFR, Est AFR Am: >60 mL/min (ref >60)
GFR, Estimated: 57 mL/min — ABNORMAL LOW (ref >60)
Glucose, Bld: 115 mg/dL — ABNORMAL HIGH (ref 70–99)
Potassium: 4.2 mmol/L (ref 3.5–5.1)
Sodium: 143 mmol/L (ref 135–145)
Total Bilirubin: 0.5 mg/dL (ref 0.3–1.2)
Total Protein: 6.4 g/dL — ABNORMAL LOW (ref 6.5–8.1)

## 2019-03-22 LAB — RETICULOCYTES
Immature Retic Fract: 27.2 % — ABNORMAL HIGH (ref 2.3–15.9)
RBC.: 2.87 MIL/uL — ABNORMAL LOW (ref 3.87–5.11)
Retic Count, Absolute: 68.3 10*3/uL (ref 19.0–186.0)
Retic Ct Pct: 2.4 % (ref 0.4–3.1)

## 2019-03-22 LAB — IRON AND TIBC
Iron: 120 ug/dL (ref 41–142)
Saturation Ratios: 48 % (ref 21–57)
TIBC: 253 ug/dL (ref 236–444)
UIBC: 132 ug/dL (ref 120–384)

## 2019-03-22 LAB — FERRITIN: Ferritin: 2040 ng/mL — ABNORMAL HIGH (ref 11–307)

## 2019-03-22 MED ORDER — EPOETIN ALFA-EPBX 40000 UNIT/ML IJ SOLN
40000.0000 [IU] | Freq: Once | INTRAMUSCULAR | Status: AC
  Administered 2019-03-22: 40000 [IU] via SUBCUTANEOUS
  Filled 2019-03-22: qty 1

## 2019-03-22 NOTE — Progress Notes (Signed)
Hematology and Oncology Follow Up Visit  Helen Hardy [id number] 10-Apr-1951 68 y.o. 03/22/2019   Principle Diagnosis:  1. 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 Sickle cell trait 3. 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 Iron deficiency anemia  Current Therapy:   IV Iron as indicated - last dose received in December 2016 Aranesp 300 mcg sq for Hgb < 11 Folic acid 1 mg po daily   Interim History:  Helen Hardy is here today for follow-up.  As always, she was quite talkative.  She really loves to chit chat about her family and what is going on.   Her rheumatoid arthritis is still bother her quite a bit.  She does get around okay.   She is doing well with the Aranesp.  Her hemoglobin is 10.2 today.  I will go ahead and give her another dose.  Her blood pressure is okay.  She has had no bleeding.  There is been no fever.  She has had no cough.  She has had no rashes.  There is been no nausea or vomiting.  Overall, her performance status is  ECOG 1.    Medications:  Allergies as of 03/22/2019      Reactions   Oxycodone Nausea And Vomiting      Medication List       Accurate as of March 22, 2019 12:12 PM. If you have any questions, ask your nurse or doctor.        STOP taking these medications   amLODipine 10 MG tablet Commonly known as: NORVASC Stopped by: Volanda Napoleon, MD   amoxicillin 500 MG tablet Commonly known as: AMOXIL Stopped by: Volanda Napoleon, MD   celecoxib 200 MG capsule Commonly known as: CELEBREX Stopped by: Volanda Napoleon, MD   erythromycin ophthalmic ointment Stopped by: Volanda Napoleon, MD   VITAMIN A PO Stopped by: Volanda Napoleon, MD     TAKE these medications   estradiol 1 MG tablet Commonly known as: ESTRACE Take 1 mg by mouth daily.   folic acid 1 MG tablet Commonly known as: FOLVITE Take 2 mg by mouth daily.   furosemide 40 MG tablet Commonly known as: LASIX TAKE 1 TABLET  BY MOUTH ONCE DAILY FOR 30 DAYS   gabapentin 300 MG capsule Commonly known as: NEURONTIN Take 300 mg by mouth 3 (three) times daily.   HYDROcodone-acetaminophen 10-325 MG tablet Commonly known as: NORCO Take 1 tablet by mouth 3 (three) times daily as needed. What changed: Another medication with the same name was removed. Continue taking this medication, and follow the directions you see here. Changed by: Volanda Napoleon, MD   leflunomide 20 MG tablet Commonly known as: ARAVA Take 20 mg by mouth daily.   methocarbamol 500 MG tablet Commonly known as: ROBAXIN Take 750 mg by mouth 3 (three) times daily.   multivitamin tablet Take 1 tablet by mouth daily.   olmesartan 20 MG tablet Commonly known as: BENICAR Take 20 mg by mouth daily.   ondansetron 8 MG disintegrating tablet Commonly known as: Zofran ODT 97m ODT q4 hours prn nausea   Orencia 250 MG injection Generic drug: abatacept Inject 250 mg into the vein every 28 (twenty-eight) days.   oxyCODONE-acetaminophen 5-325 MG tablet Commonly known as: Percocet Take 1-2 tablets by mouth every 6 (six) hours as needed.   pantoprazole 40 MG tablet Commonly known as: PROTONIX Take 40 mg by mouth 2 (two) times daily.  Potassium Chloride ER 20 MEQ Tbcr TAKE 1 TABLET BY MOUTH ONCE DAILY WITH FOOD FOR 30 DAYS What changed: Another medication with the same name was removed. Continue taking this medication, and follow the directions you see here. Changed by: Volanda Napoleon, MD   predniSONE 10 MG tablet Commonly known as: DELTASONE Take 10 mg by mouth daily.   triamcinolone cream 0.1 % Commonly known as: KENALOG Apply 1 application topically as needed (for rash).   vitamin B-12 1000 MCG tablet Commonly known as: CYANOCOBALAMIN Take 1,000 mcg by mouth daily.   vitamin C 1000 MG tablet Take 1,000 mg by mouth daily.   Vitamin D3 25 MCG (1000 UT) Caps Take 2 capsules by mouth daily.       Allergies:  Allergies   Allergen Reactions  . Oxycodone Nausea And Vomiting    Past Medical History, Surgical history, Social history, and Family History were reviewed and updated.  Review of Systems: Review of Systems  Constitutional: Negative.   HENT: Negative.   Eyes: Negative.   Respiratory: Negative.   Cardiovascular: Negative.   Gastrointestinal: Negative.   Genitourinary: Negative.   Musculoskeletal: Positive for joint pain.  Skin: Negative.   Neurological: Negative.   Endo/Heme/Allergies: Negative.   Psychiatric/Behavioral: Negative.        Physical Exam:  weight is 235 lb (106.6 kg). Her temporal temperature is 97.4 F (36.3 C) (abnormal). Her blood pressure is 156/84 (abnormal) and her pulse is 66. Her respiration is 18 and oxygen saturation is 98%.   Wt Readings from Last 3 Encounters:  03/22/19 235 lb (106.6 kg)  02/24/19 237 lb 1.9 oz (107.6 kg)  01/27/19 236 lb (107 kg)    Physical Exam Vitals signs reviewed.  HENT:     Head: Normocephalic and atraumatic.  Eyes:     Pupils: Pupils are equal, round, and reactive to light.  Neck:     Musculoskeletal: Normal range of motion.  Cardiovascular:     Rate and Rhythm: Normal rate and regular rhythm.     Heart sounds: Normal heart sounds.  Pulmonary:     Effort: Pulmonary effort is normal.     Breath sounds: Normal breath sounds.  Abdominal:     General: Bowel sounds are normal.     Palpations: Abdomen is soft.  Musculoskeletal: Normal range of motion.        General: No tenderness or deformity.  Lymphadenopathy:     Cervical: No cervical adenopathy.  Skin:    General: Skin is warm and dry.     Findings: No erythema or rash.  Neurological:     Mental Status: She is alert and oriented to person, place, and time.  Psychiatric:        Behavior: Behavior normal.        Thought Content: Thought content normal.        Judgment: Judgment normal.      Lab Results  Component Value Date   WBC 5.6 03/22/2019   HGB 10.2 (L)  03/22/2019   HCT 31.9 (L) 03/22/2019   MCV 111.9 (H) 03/22/2019   PLT 154 03/22/2019   Lab Results  Component Value Date   FERRITIN 2,122 (H) 02/24/2019   IRON 84 02/24/2019   TIBC 243 02/24/2019   UIBC 159 02/24/2019   IRONPCTSAT 34 02/24/2019   Lab Results  Component Value Date   RETICCTPCT 2.4 03/22/2019   RBC 2.87 (L) 03/22/2019   RBC 2.85 (L) 03/22/2019   RETICCTABS 41.0 06/21/2015  No results found for: KPAFRELGTCHN, LAMBDASER, KAPLAMBRATIO No results found for: IGGSERUM, IGA, IGMSERUM No results found for: Odetta Pink, SPEI   Chemistry      Component Value Date/Time   NA 143 03/22/2019 1040   NA 144 04/29/2017 0929   K 4.2 03/22/2019 1040   K 4.0 04/29/2017 0929   CL 106 03/22/2019 1040   CL 103 06/21/2015 0800   CO2 28 03/22/2019 1040   CO2 25 04/29/2017 0929   BUN 19 03/22/2019 1040   BUN 17.7 04/29/2017 0929   CREATININE 1.01 (H) 03/22/2019 1040   CREATININE 0.8 04/29/2017 0929      Component Value Date/Time   CALCIUM 9.5 03/22/2019 1040   CALCIUM 9.3 04/29/2017 0929   ALKPHOS 55 03/22/2019 1040   ALKPHOS 55 04/29/2017 0929   AST 21 03/22/2019 1040   AST 19 04/29/2017 0929   ALT 16 03/22/2019 1040   ALT 16 04/29/2017 0929   BILITOT 0.5 03/22/2019 1040   BILITOT 0.43 04/29/2017 0929      Impression and Plan: Helen Hardy is a very pleasant 68 yo African American female with 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 She also has intermittent iron deficiency anemia and sickle cell trait.   She is taking her folic acid daily as prescribed.   I have to believe that the Aranesp will get her hemoglobin up.  We will have her come back in about 4 weeks or so.  We will continue to monitor her kidney function and her iron studies along with her hemoglobin.  Volanda Napoleon, MD 9/16/202012:12 PM

## 2019-03-22 NOTE — Telephone Encounter (Signed)
Appointments scheduled calendar printed per 9/16 los

## 2019-03-22 NOTE — Patient Instructions (Signed)
Epoetin Alfa injection (Retacrit) What is this medicine? EPOETIN ALFA (e POE e tin AL fa) helps your body make more red blood cells. This medicine is used to treat anemia caused by chronic kidney disease, cancer chemotherapy, or HIV-therapy. It may also be used before surgery if you have anemia. This medicine may be used for other purposes; ask your health care provider or pharmacist if you have questions. COMMON BRAND NAME(S): Epogen, Procrit, Retacrit What should I tell my health care provider before I take this medicine? They need to know if you have any of these conditions:  cancer  heart disease  high blood pressure  history of blood clots  history of stroke  low levels of folate, iron, or vitamin B12 in the blood  seizures  an unusual or allergic reaction to erythropoietin, albumin, benzyl alcohol, hamster proteins, other medicines, foods, dyes, or preservatives  pregnant or trying to get pregnant  breast-feeding How should I use this medicine? This medicine is for injection into a vein or under the skin. It is usually given by a health care professional in a hospital or clinic setting. If you get this medicine at home, you will be taught how to prepare and give this medicine. Use exactly as directed. Take your medicine at regular intervals. Do not take your medicine more often than directed. It is important that you put your used needles and syringes in a special sharps container. Do not put them in a trash can. If you do not have a sharps container, call your pharmacist or healthcare provider to get one. A special MedGuide will be given to you by the pharmacist with each prescription and refill. Be sure to read this information carefully each time. Talk to your pediatrician regarding the use of this medicine in children. While this drug may be prescribed for selected conditions, precautions do apply. Overdosage: If you think you have taken too much of this medicine contact a  poison control center or emergency room at once. NOTE: This medicine is only for you. Do not share this medicine with others. What if I miss a dose? If you miss a dose, take it as soon as you can. If it is almost time for your next dose, take only that dose. Do not take double or extra doses. What may interact with this medicine? Interactions have not been studied. This list may not describe all possible interactions. Give your health care provider a list of all the medicines, herbs, non-prescription drugs, or dietary supplements you use. Also tell them if you smoke, drink alcohol, or use illegal drugs. Some items may interact with your medicine. What should I watch for while using this medicine? Your condition will be monitored carefully while you are receiving this medicine. You may need blood work done while you are taking this medicine. This medicine may cause a decrease in vitamin B6. You should make sure that you get enough vitamin B6 while you are taking this medicine. Discuss the foods you eat and the vitamins you take with your health care professional. What side effects may I notice from receiving this medicine? Side effects that you should report to your doctor or health care professional as soon as possible:  allergic reactions like skin rash, itching or hives, swelling of the face, lips, or tongue  seizures  signs and symptoms of a blood clot such as breathing problems; changes in vision; chest pain; severe, sudden headache; pain, swelling, warmth in the leg; trouble speaking; sudden numbness  or weakness of the face, arm or leg  signs and symptoms of a stroke like changes in vision; confusion; trouble speaking or understanding; severe headaches; sudden numbness or weakness of the face, arm or leg; trouble walking; dizziness; loss of balance or coordination Side effects that usually do not require medical attention (report to your doctor or health care professional if they continue  or are bothersome):  chills  cough  dizziness  fever  headaches  joint pain  muscle cramps  muscle pain  nausea, vomiting  pain, redness, or irritation at site where injected This list may not describe all possible side effects. Call your doctor for medical advice about side effects. You may report side effects to FDA at 1-800-FDA-1088. Where should I keep my medicine? Keep out of the reach of children. Store in a refrigerator between 2 and 8 degrees C (36 and 46 degrees F). Do not freeze or shake. Throw away any unused portion if using a single-dose vial. Multi-dose vials can be kept in the refrigerator for up to 21 days after the initial dose. Throw away unused medicine. NOTE: This sheet is a summary. It may not cover all possible information. If you have questions about this medicine, talk to your doctor, pharmacist, or health care provider.  2020 Elsevier/Gold Standard (2017-01-29 08:35:19)

## 2019-03-23 ENCOUNTER — Telehealth: Payer: Self-pay | Admitting: *Deleted

## 2019-03-23 NOTE — Telephone Encounter (Signed)
Called pt, unable to reach. LMOVM for pt regarding her results

## 2019-03-23 NOTE — Telephone Encounter (Signed)
-----   Message from Volanda Napoleon, MD sent at 03/22/2019  4:49 PM EDT ----- Please call her and tell her that her iron level is okay.  Thank you

## 2019-03-27 DIAGNOSIS — M0589 Other rheumatoid arthritis with rheumatoid factor of multiple sites: Secondary | ICD-10-CM | POA: Diagnosis not present

## 2019-03-27 DIAGNOSIS — D649 Anemia, unspecified: Secondary | ICD-10-CM | POA: Diagnosis not present

## 2019-03-27 DIAGNOSIS — M0579 Rheumatoid arthritis with rheumatoid factor of multiple sites without organ or systems involvement: Secondary | ICD-10-CM | POA: Diagnosis not present

## 2019-04-03 DIAGNOSIS — M65871 Other synovitis and tenosynovitis, right ankle and foot: Secondary | ICD-10-CM | POA: Diagnosis not present

## 2019-04-03 DIAGNOSIS — M25571 Pain in right ankle and joints of right foot: Secondary | ICD-10-CM | POA: Diagnosis not present

## 2019-04-20 ENCOUNTER — Inpatient Hospital Stay (HOSPITAL_BASED_OUTPATIENT_CLINIC_OR_DEPARTMENT_OTHER): Payer: Medicare Other | Admitting: Hematology & Oncology

## 2019-04-20 ENCOUNTER — Other Ambulatory Visit: Payer: Self-pay

## 2019-04-20 ENCOUNTER — Inpatient Hospital Stay: Payer: Medicare Other

## 2019-04-20 ENCOUNTER — Encounter: Payer: Self-pay | Admitting: Hematology & Oncology

## 2019-04-20 ENCOUNTER — Inpatient Hospital Stay: Payer: Medicare Other | Attending: Hematology & Oncology

## 2019-04-20 ENCOUNTER — Telehealth: Payer: Self-pay | Admitting: Hematology & Oncology

## 2019-04-20 VITALS — BP 150/84 | HR 70 | Temp 97.3°F | Resp 16 | Wt 233.0 lb

## 2019-04-20 DIAGNOSIS — D631 Anemia in chronic kidney disease: Secondary | ICD-10-CM

## 2019-04-20 DIAGNOSIS — D509 Iron deficiency anemia, unspecified: Secondary | ICD-10-CM | POA: Insufficient documentation

## 2019-04-20 DIAGNOSIS — D638 Anemia in other chronic diseases classified elsewhere: Secondary | ICD-10-CM | POA: Diagnosis not present

## 2019-04-20 DIAGNOSIS — E611 Iron deficiency: Secondary | ICD-10-CM | POA: Insufficient documentation

## 2019-04-20 DIAGNOSIS — N189 Chronic kidney disease, unspecified: Secondary | ICD-10-CM | POA: Diagnosis not present

## 2019-04-20 DIAGNOSIS — N183 Chronic kidney disease, stage 3 unspecified: Secondary | ICD-10-CM

## 2019-04-20 DIAGNOSIS — D573 Sickle-cell trait: Secondary | ICD-10-CM | POA: Diagnosis not present

## 2019-04-20 DIAGNOSIS — Z79899 Other long term (current) drug therapy: Secondary | ICD-10-CM | POA: Insufficient documentation

## 2019-04-20 DIAGNOSIS — D500 Iron deficiency anemia secondary to blood loss (chronic): Secondary | ICD-10-CM

## 2019-04-20 DIAGNOSIS — M069 Rheumatoid arthritis, unspecified: Secondary | ICD-10-CM | POA: Insufficient documentation

## 2019-04-20 DIAGNOSIS — K909 Intestinal malabsorption, unspecified: Secondary | ICD-10-CM

## 2019-04-20 DIAGNOSIS — Z8572 Personal history of non-Hodgkin lymphomas: Secondary | ICD-10-CM | POA: Insufficient documentation

## 2019-04-20 LAB — CBC WITH DIFFERENTIAL (CANCER CENTER ONLY)
Abs Immature Granulocytes: 0.04 10*3/uL (ref 0.00–0.07)
Basophils Absolute: 0 10*3/uL (ref 0.0–0.1)
Basophils Relative: 0 %
Eosinophils Absolute: 0 10*3/uL (ref 0.0–0.5)
Eosinophils Relative: 1 %
HCT: 34.4 % — ABNORMAL LOW (ref 36.0–46.0)
Hemoglobin: 10.8 g/dL — ABNORMAL LOW (ref 12.0–15.0)
Immature Granulocytes: 1 %
Lymphocytes Relative: 30 %
Lymphs Abs: 1.7 10*3/uL (ref 0.7–4.0)
MCH: 35.2 pg — ABNORMAL HIGH (ref 26.0–34.0)
MCHC: 31.4 g/dL (ref 30.0–36.0)
MCV: 112.1 fL — ABNORMAL HIGH (ref 80.0–100.0)
Monocytes Absolute: 0.5 10*3/uL (ref 0.1–1.0)
Monocytes Relative: 8 %
Neutro Abs: 3.5 10*3/uL (ref 1.7–7.7)
Neutrophils Relative %: 60 %
Platelet Count: 139 10*3/uL — ABNORMAL LOW (ref 150–400)
RBC: 3.07 MIL/uL — ABNORMAL LOW (ref 3.87–5.11)
RDW: 16 % — ABNORMAL HIGH (ref 11.5–15.5)
WBC Count: 5.8 10*3/uL (ref 4.0–10.5)
nRBC: 0 % (ref 0.0–0.2)

## 2019-04-20 LAB — CMP (CANCER CENTER ONLY)
ALT: 19 U/L (ref 0–44)
AST: 22 U/L (ref 15–41)
Albumin: 4.3 g/dL (ref 3.5–5.0)
Alkaline Phosphatase: 62 U/L (ref 38–126)
Anion gap: 9 (ref 5–15)
BUN: 17 mg/dL (ref 8–23)
CO2: 27 mmol/L (ref 22–32)
Calcium: 9.4 mg/dL (ref 8.9–10.3)
Chloride: 109 mmol/L (ref 98–111)
Creatinine: 0.97 mg/dL (ref 0.44–1.00)
GFR, Est AFR Am: >60 mL/min (ref >60)
GFR, Estimated: 60 mL/min — ABNORMAL LOW (ref >60)
Glucose, Bld: 100 mg/dL — ABNORMAL HIGH (ref 70–99)
Potassium: 4.5 mmol/L (ref 3.5–5.1)
Sodium: 145 mmol/L (ref 135–145)
Total Bilirubin: 0.5 mg/dL (ref 0.3–1.2)
Total Protein: 6.5 g/dL (ref 6.5–8.1)

## 2019-04-20 LAB — RETICULOCYTES
Immature Retic Fract: 30.4 % — ABNORMAL HIGH (ref 2.3–15.9)
RBC.: 3.07 MIL/uL — ABNORMAL LOW (ref 3.87–5.11)
Retic Count, Absolute: 76.8 10*3/uL (ref 19.0–186.0)
Retic Ct Pct: 2.5 % (ref 0.4–3.1)

## 2019-04-20 MED ORDER — EPOETIN ALFA-EPBX 40000 UNIT/ML IJ SOLN
40000.0000 [IU] | Freq: Once | INTRAMUSCULAR | Status: AC
  Administered 2019-04-20: 40000 [IU] via SUBCUTANEOUS
  Filled 2019-04-20: qty 1

## 2019-04-20 NOTE — Progress Notes (Signed)
Hematology and Oncology Follow Up Visit  Helen Hardy [id number] [birth year] 68 y.o. 04/20/2019   Principle Diagnosis:  1. 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 Sickle cell trait 3. 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 Iron deficiency anemia  Current Therapy:   IV Iron as indicated - last dose received in December 2016 Aranesp 300 mcg sq for Hgb < 11 Folic acid 1 mg po daily   Interim History:  [name] is here today for follow-up.  As always, she was quite talkative.  She really loves to chit chat about her family and what is going on.   Her rheumatoid arthritis is still bother her quite a bit.  She does get around okay.   The Aranesp is working quite well.  Her hemoglobin is slowly trending upward.  She feels better.  She does not have as much swelling in her legs.  She has had no bleeding.  There is no headache.  She has had no nausea or vomiting.  She has had no visual changes.  There is been no change in bowel or bladder habits.  Overall, her performance status is  ECOG 1.    Medications:  Allergies as of 04/20/2019      Reactions   Oxycodone Nausea And Vomiting      Medication List       Accurate as of April 20, 2019 12:50 PM. If you have any questions, ask your nurse or doctor.        estradiol 1 MG tablet Commonly known as: ESTRACE Take 1 mg by mouth daily.   folic acid 1 MG tablet Commonly known as: FOLVITE Take 2 mg by mouth daily.   furosemide 40 MG tablet Commonly known as: LASIX TAKE 1 TABLET BY MOUTH ONCE DAILY FOR 30 DAYS   gabapentin 300 MG capsule Commonly known as: NEURONTIN Take 300 mg by mouth 3 (three) times daily.   HYDROcodone-acetaminophen 10-325 MG tablet Commonly known as: NORCO Take 1 tablet by mouth 3 (three) times daily as needed.   leflunomide 20 MG tablet Commonly known as: ARAVA Take 20 mg by mouth daily.   methocarbamol 500 MG tablet Commonly known as: ROBAXIN  Take 750 mg by mouth 3 (three) times daily.   multivitamin tablet Take 1 tablet by mouth daily.   olmesartan 20 MG tablet Commonly known as: BENICAR Take 20 mg by mouth daily.   ondansetron 4 MG tablet Commonly known as: ZOFRAN Take 4 mg by mouth.   ondansetron 8 MG disintegrating tablet Commonly known as: Zofran ODT 32m ODT q4 hours prn nausea   Orencia 250 MG injection Generic drug: abatacept Inject 250 mg into the vein every 28 (twenty-eight) days.   oxyCODONE-acetaminophen 5-325 MG tablet Commonly known as: Percocet Take 1-2 tablets by mouth every 6 (six) hours as needed.   pantoprazole 40 MG tablet Commonly known as: PROTONIX Take 40 mg by mouth 2 (two) times daily.   Potassium Chloride ER 20 MEQ Tbcr TAKE 1 TABLET BY MOUTH ONCE DAILY WITH FOOD FOR 30 DAYS   predniSONE 10 MG tablet Commonly known as: DELTASONE Take 10 mg by mouth daily.   triamcinolone cream 0.1 % Commonly known as: KENALOG Apply 1 application topically as needed (for rash).   vitamin B-12 1000 MCG tablet Commonly known as: CYANOCOBALAMIN Take 1,000 mcg by mouth daily.   vitamin C 1000 MG tablet Take 1,000 mg by mouth daily.   Vitamin D3 25 MCG (1000 UT)  Caps Take 2 capsules by mouth daily.       Allergies:  Allergies  Allergen Reactions  . Oxycodone Nausea And Vomiting    Past Medical History, Surgical history, Social history, and Family History were reviewed and updated.  Review of Systems: Review of Systems  Constitutional: Negative.   HENT: Negative.   Eyes: Negative.   Respiratory: Negative.   Cardiovascular: Negative.   Gastrointestinal: Negative.   Genitourinary: Negative.   Musculoskeletal: Positive for joint pain.  Skin: Negative.   Neurological: Negative.   Endo/Heme/Allergies: Negative.   Psychiatric/Behavioral: Negative.        Physical Exam:  weight is 233 lb (105.7 kg). Her temporal temperature is 97.3 F (36.3 C) (abnormal). Her blood pressure is  150/84 (abnormal) and her pulse is 70. Her respiration is 16 and oxygen saturation is 98%.   Wt Readings from Last 3 Encounters:  04/20/19 233 lb (105.7 kg)  03/22/19 235 lb (106.6 kg)  02/24/19 237 lb 1.9 oz (107.6 kg)    Physical Exam Vitals signs reviewed.  HENT:     Head: Normocephalic and atraumatic.  Eyes:     Pupils: Pupils are equal, round, and reactive to light.  Neck:     Musculoskeletal: Normal range of motion.  Cardiovascular:     Rate and Rhythm: Normal rate and regular rhythm.     Heart sounds: Normal heart sounds.  Pulmonary:     Effort: Pulmonary effort is normal.     Breath sounds: Normal breath sounds.  Abdominal:     General: Bowel sounds are normal.     Palpations: Abdomen is soft.  Musculoskeletal: Normal range of motion.        General: No tenderness or deformity.  Lymphadenopathy:     Cervical: No cervical adenopathy.  Skin:    General: Skin is warm and dry.     Findings: No erythema or rash.  Neurological:     Mental Status: She is alert and oriented to person, place, and time.  Psychiatric:        Behavior: Behavior normal.        Thought Content: Thought content normal.        Judgment: Judgment normal.      Lab Results  Component Value Date   WBC 5.8 04/20/2019   HGB 10.8 (L) 04/20/2019   HCT 34.4 (L) 04/20/2019   MCV 112.1 (H) 04/20/2019   PLT 139 (L) 04/20/2019   Lab Results  Component Value Date   FERRITIN 2,040 (H) 03/22/2019   IRON 120 03/22/2019   TIBC 253 03/22/2019   UIBC 132 03/22/2019   IRONPCTSAT 48 03/22/2019   Lab Results  Component Value Date   RETICCTPCT 2.5 04/20/2019   RBC 3.07 (L) 04/20/2019   RETICCTABS 41.0 06/21/2015   No results found for: KPAFRELGTCHN, LAMBDASER, KAPLAMBRATIO No results found for: IGGSERUM, IGA, IGMSERUM No results found for: Odetta Pink, SPEI   Chemistry      Component Value Date/Time   NA 145 04/20/2019 1131   NA 144  04/29/2017 0929   K 4.5 04/20/2019 1131   K 4.0 04/29/2017 0929   CL 109 04/20/2019 1131   CL 103 06/21/2015 0800   CO2 27 04/20/2019 1131   CO2 25 04/29/2017 0929   BUN 17 04/20/2019 1131   BUN 17.7 04/29/2017 0929   CREATININE 0.97 04/20/2019 1131   CREATININE 0.8 04/29/2017 0929      Component Value Date/Time   CALCIUM  9.4 04/20/2019 1131   CALCIUM 9.3 04/29/2017 0929   ALKPHOS 62 04/20/2019 1131   ALKPHOS 55 04/29/2017 0929   AST 22 04/20/2019 1131   AST 19 04/29/2017 0929   ALT 19 04/20/2019 1131   ALT 16 04/29/2017 0929   BILITOT 0.5 04/20/2019 1131   BILITOT 0.43 04/29/2017 0929      Impression and Plan: Ms. Housh is a very pleasant 68 yo African American female with 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 She also has intermittent iron deficiency anemia and sickle cell trait.   She is taking her folic acid daily as prescribed.   I have to believe that the Aranesp will get her hemoglobin up.  We will go ahead and give her a dose today.  Her 68th birthday is coming up in a couple weeks.  I know this will be a big event for she and her family.  We will have her come back in about 4 weeks or so.  Want to make sure that we get her back before Thanksgiving to make sure that her blood is optimal for the Thanksgiving holiday.    We will continue to monitor her kidney function and her iron studies along with her hemoglobin.  Volanda Napoleon, MD 10/15/202012:50 PM

## 2019-04-20 NOTE — Patient Instructions (Signed)

## 2019-04-20 NOTE — Telephone Encounter (Signed)
Appointments scheduled calendar printed per 10/15 los

## 2019-04-21 LAB — IRON AND TIBC
Iron: 133 ug/dL (ref 41–142)
Saturation Ratios: 50 % (ref 21–57)
TIBC: 268 ug/dL (ref 236–444)
UIBC: 135 ug/dL (ref 120–384)

## 2019-04-21 LAB — FERRITIN: Ferritin: 1097 ng/mL — ABNORMAL HIGH (ref 11–307)

## 2019-04-24 DIAGNOSIS — M0589 Other rheumatoid arthritis with rheumatoid factor of multiple sites: Secondary | ICD-10-CM | POA: Diagnosis not present

## 2019-04-28 DIAGNOSIS — Z79899 Other long term (current) drug therapy: Secondary | ICD-10-CM | POA: Diagnosis not present

## 2019-04-28 DIAGNOSIS — Z5181 Encounter for therapeutic drug level monitoring: Secondary | ICD-10-CM | POA: Diagnosis not present

## 2019-04-28 DIAGNOSIS — M5416 Radiculopathy, lumbar region: Secondary | ICD-10-CM | POA: Diagnosis not present

## 2019-04-28 DIAGNOSIS — G894 Chronic pain syndrome: Secondary | ICD-10-CM | POA: Diagnosis not present

## 2019-05-04 DIAGNOSIS — D849 Immunodeficiency, unspecified: Secondary | ICD-10-CM | POA: Diagnosis not present

## 2019-05-04 DIAGNOSIS — I10 Essential (primary) hypertension: Secondary | ICD-10-CM | POA: Diagnosis not present

## 2019-05-04 DIAGNOSIS — B027 Disseminated zoster: Secondary | ICD-10-CM | POA: Diagnosis not present

## 2019-05-04 DIAGNOSIS — R61 Generalized hyperhidrosis: Secondary | ICD-10-CM | POA: Diagnosis not present

## 2019-05-16 DIAGNOSIS — B027 Disseminated zoster: Secondary | ICD-10-CM | POA: Diagnosis not present

## 2019-05-16 DIAGNOSIS — D849 Immunodeficiency, unspecified: Secondary | ICD-10-CM | POA: Diagnosis not present

## 2019-05-18 ENCOUNTER — Inpatient Hospital Stay: Payer: Medicare Other | Attending: Hematology & Oncology | Admitting: Family

## 2019-05-18 ENCOUNTER — Other Ambulatory Visit: Payer: Self-pay

## 2019-05-18 ENCOUNTER — Inpatient Hospital Stay: Payer: Medicare Other

## 2019-05-18 ENCOUNTER — Encounter: Payer: Self-pay | Admitting: Family

## 2019-05-18 VITALS — BP 114/86 | HR 88 | Temp 97.1°F | Resp 18 | Wt 226.0 lb

## 2019-05-18 DIAGNOSIS — M069 Rheumatoid arthritis, unspecified: Secondary | ICD-10-CM | POA: Insufficient documentation

## 2019-05-18 DIAGNOSIS — N183 Chronic kidney disease, stage 3 unspecified: Secondary | ICD-10-CM

## 2019-05-18 DIAGNOSIS — D509 Iron deficiency anemia, unspecified: Secondary | ICD-10-CM | POA: Diagnosis not present

## 2019-05-18 DIAGNOSIS — D573 Sickle-cell trait: Secondary | ICD-10-CM | POA: Diagnosis not present

## 2019-05-18 DIAGNOSIS — D631 Anemia in chronic kidney disease: Secondary | ICD-10-CM

## 2019-05-18 DIAGNOSIS — D638 Anemia in other chronic diseases classified elsewhere: Secondary | ICD-10-CM | POA: Insufficient documentation

## 2019-05-18 DIAGNOSIS — D500 Iron deficiency anemia secondary to blood loss (chronic): Secondary | ICD-10-CM

## 2019-05-18 DIAGNOSIS — R21 Rash and other nonspecific skin eruption: Secondary | ICD-10-CM | POA: Diagnosis not present

## 2019-05-18 DIAGNOSIS — Z8572 Personal history of non-Hodgkin lymphomas: Secondary | ICD-10-CM | POA: Insufficient documentation

## 2019-05-18 LAB — CBC WITH DIFFERENTIAL (CANCER CENTER ONLY)
Abs Immature Granulocytes: 0.04 10*3/uL (ref 0.00–0.07)
Basophils Absolute: 0 10*3/uL (ref 0.0–0.1)
Basophils Relative: 0 %
Eosinophils Absolute: 0 10*3/uL (ref 0.0–0.5)
Eosinophils Relative: 0 %
HCT: 34.1 % — ABNORMAL LOW (ref 36.0–46.0)
Hemoglobin: 11.2 g/dL — ABNORMAL LOW (ref 12.0–15.0)
Immature Granulocytes: 1 %
Lymphocytes Relative: 33 %
Lymphs Abs: 2.4 10*3/uL (ref 0.7–4.0)
MCH: 35.8 pg — ABNORMAL HIGH (ref 26.0–34.0)
MCHC: 32.8 g/dL (ref 30.0–36.0)
MCV: 108.9 fL — ABNORMAL HIGH (ref 80.0–100.0)
Monocytes Absolute: 0.5 10*3/uL (ref 0.1–1.0)
Monocytes Relative: 7 %
Neutro Abs: 4.3 10*3/uL (ref 1.7–7.7)
Neutrophils Relative %: 59 %
Platelet Count: 164 10*3/uL (ref 150–400)
RBC: 3.13 MIL/uL — ABNORMAL LOW (ref 3.87–5.11)
RDW: 15.4 % (ref 11.5–15.5)
WBC Count: 7.2 10*3/uL (ref 4.0–10.5)
nRBC: 0 % (ref 0.0–0.2)

## 2019-05-18 LAB — RETICULOCYTES
Immature Retic Fract: 10.5 % (ref 2.3–15.9)
RBC.: 3.16 MIL/uL — ABNORMAL LOW (ref 3.87–5.11)
Retic Count, Absolute: 45.2 10*3/uL (ref 19.0–186.0)
Retic Ct Pct: 1.4 % (ref 0.4–3.1)

## 2019-05-18 LAB — CMP (CANCER CENTER ONLY)
ALT: 17 U/L (ref 0–44)
AST: 19 U/L (ref 15–41)
Albumin: 4.4 g/dL (ref 3.5–5.0)
Alkaline Phosphatase: 60 U/L (ref 38–126)
Anion gap: 10 (ref 5–15)
BUN: 40 mg/dL — ABNORMAL HIGH (ref 8–23)
CO2: 26 mmol/L (ref 22–32)
Calcium: 10.2 mg/dL (ref 8.9–10.3)
Chloride: 106 mmol/L (ref 98–111)
Creatinine: 1.33 mg/dL — ABNORMAL HIGH (ref 0.44–1.00)
GFR, Est AFR Am: 47 mL/min — ABNORMAL LOW (ref >60)
GFR, Estimated: 40 mL/min — ABNORMAL LOW (ref >60)
Glucose, Bld: 108 mg/dL — ABNORMAL HIGH (ref 70–99)
Potassium: 4.8 mmol/L (ref 3.5–5.1)
Sodium: 142 mmol/L (ref 135–145)
Total Bilirubin: 0.4 mg/dL (ref 0.3–1.2)
Total Protein: 6.6 g/dL (ref 6.5–8.1)

## 2019-05-18 NOTE — Progress Notes (Signed)
Hematology and Oncology Follow Up Visit  Helen Hardy [id number] 1950/11/30 68 y.o. 05/18/2019   Principle Diagnosis:  1. 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 Sickle cell trait 3. 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 Iron deficiency anemia  Current Therapy:   IV Iron as indicated - last dose received in December 2016 Retacrit 40,000 units sq for Hgb < 11 Folic acid 1 mg po daily   Interim History:  Helen Hardy is here today for follow-up. She states that she was diagnosed with what her PCP felt was chicken pox a week a couple weeks ago. She had a rash on her face, neck and back with vesicles. She was treated with Valacyclovir and this has resolved. She still has a few scratch marks on her back that are healing.  She denies fatigue, chills, n/v, cough, rash, dizziness, SOB, chest pain, palpitations, abdominal pain or changes in bowel or bladder habits.  No swelling, numbness or tingling in her extremities at this time. She has generalized joint aches and pains due to rheumatoid arthritis and is doing well on Arava.  No falls or syncope.  She is eating well and staying hydrated. Her weight is stable.   ECOG Performance Status: 1 - Symptomatic but completely ambulatory  Medications:  Allergies as of 05/18/2019      Reactions   Oxycodone Nausea And Vomiting      Medication List       Accurate as of May 18, 2019  1:31 PM. If you have any questions, ask your nurse or doctor.        estradiol 1 MG tablet Commonly known as: ESTRACE Take 1 mg by mouth daily.   folic acid 1 MG tablet Commonly known as: FOLVITE Take 2 mg by mouth daily.   furosemide 40 MG tablet Commonly known as: LASIX TAKE 1 TABLET BY MOUTH ONCE DAILY FOR 30 DAYS   gabapentin 300 MG capsule Commonly known as: NEURONTIN Take 300 mg by mouth 3 (three) times daily.   HYDROcodone-acetaminophen 10-325 MG tablet Commonly known as: NORCO Take 1 tablet  by mouth 3 (three) times daily as needed.   leflunomide 20 MG tablet Commonly known as: ARAVA Take 20 mg by mouth daily.   methocarbamol 500 MG tablet Commonly known as: ROBAXIN Take 750 mg by mouth 3 (three) times daily.   multivitamin tablet Take 1 tablet by mouth daily.   olmesartan 20 MG tablet Commonly known as: BENICAR Take 20 mg by mouth daily.   ondansetron 4 MG tablet Commonly known as: ZOFRAN Take 4 mg by mouth.   ondansetron 8 MG disintegrating tablet Commonly known as: Zofran ODT 55m ODT q4 hours prn nausea   Orencia 250 MG injection Generic drug: abatacept Inject 250 mg into the vein every 28 (twenty-eight) days.   oxyCODONE-acetaminophen 5-325 MG tablet Commonly known as: Percocet Take 1-2 tablets by mouth every 6 (six) hours as needed.   pantoprazole 40 MG tablet Commonly known as: PROTONIX Take 40 mg by mouth 2 (two) times daily.   Potassium Chloride ER 20 MEQ Tbcr TAKE 1 TABLET BY MOUTH ONCE DAILY WITH FOOD FOR 30 DAYS   predniSONE 10 MG tablet Commonly known as: DELTASONE Take 10 mg by mouth daily.   triamcinolone cream 0.1 % Commonly known as: KENALOG Apply 1 application topically as needed (for rash).   vitamin B-12 1000 MCG tablet Commonly known as: CYANOCOBALAMIN Take 1,000 mcg by mouth daily.   vitamin C 1000  MG tablet Take 1,000 mg by mouth daily.   Vitamin D3 25 MCG (1000 UT) Caps Take 2 capsules by mouth daily.       Allergies:  Allergies  Allergen Reactions  . Oxycodone Nausea And Vomiting    Past Medical History, Surgical history, Social history, and Family History were reviewed and updated.  Review of Systems: All other 10 point review of systems is negative.   Physical Exam:  vitals were not taken for this visit.   Wt Readings from Last 3 Encounters:  04/20/19 233 lb (105.7 kg)  03/22/19 235 lb (106.6 kg)  02/24/19 237 lb 1.9 oz (107.6 kg)    Ocular: Sclerae unicteric, pupils equal, round and reactive to  light Ear-nose-throat: Oropharynx clear, dentition fair Lymphatic: No cervical or supraclavicular adenopathy Lungs no rales or rhonchi, good excursion bilaterally Heart regular rate and rhythm, no murmur appreciated Abd soft, nontender, positive bowel sounds, no liver or spleen tip palpated on exam, no fluid wave  MSK no focal spinal tenderness, no joint edema Neuro: non-focal, well-oriented, appropriate affect Breasts: Deferred   Lab Results  Component Value Date   WBC 5.8 04/20/2019   HGB 10.8 (L) 04/20/2019   HCT 34.4 (L) 04/20/2019   MCV 112.1 (H) 04/20/2019   PLT 139 (L) 04/20/2019   Lab Results  Component Value Date   FERRITIN 1,097 (H) 04/20/2019   IRON 133 04/20/2019   TIBC 268 04/20/2019   UIBC 135 04/20/2019   IRONPCTSAT 50 04/20/2019   Lab Results  Component Value Date   RETICCTPCT 2.5 04/20/2019   RBC 3.07 (L) 04/20/2019   RETICCTABS 41.0 06/21/2015   No results found for: KPAFRELGTCHN, LAMBDASER, KAPLAMBRATIO No results found for: IGGSERUM, IGA, IGMSERUM No results found for: Odetta Pink, SPEI   Chemistry      Component Value Date/Time   NA 145 04/20/2019 1131   NA 144 04/29/2017 0929   K 4.5 04/20/2019 1131   K 4.0 04/29/2017 0929   CL 109 04/20/2019 1131   CL 103 06/21/2015 0800   CO2 27 04/20/2019 1131   CO2 25 04/29/2017 0929   BUN 17 04/20/2019 1131   BUN 17.7 04/29/2017 0929   CREATININE 0.97 04/20/2019 1131   CREATININE 0.8 04/29/2017 0929      Component Value Date/Time   CALCIUM 9.4 04/20/2019 1131   CALCIUM 9.3 04/29/2017 0929   ALKPHOS 62 04/20/2019 1131   ALKPHOS 55 04/29/2017 0929   AST 22 04/20/2019 1131   AST 19 04/29/2017 0929   ALT 19 04/20/2019 1131   ALT 16 04/29/2017 0929   BILITOT 0.5 04/20/2019 1131   BILITOT 0.43 04/29/2017 0929       Impression and Plan: Helen Hardy is a very pleasant 68 yo African American female with history of low grade non-Hodgkin's  lymphoma now in clinical remission. She also has intermittent iron deficiency anemia and sickle cell trait.  Hgb is stable at 11.2, no Retacrit needed this visit.  We will see what her iron studies show and bring her back in for infusion if needed.  I forwarded all available labs to her PCP Dr. [name].  We will plan to see her back in another month.  She will contact our office with any questions or concerns. We can certainly see her sooner if needed.   Laverna Peace, NP 11/12/20201:31 PM

## 2019-05-19 LAB — FERRITIN: Ferritin: 1667 ng/mL — ABNORMAL HIGH (ref 11–307)

## 2019-05-19 LAB — IRON AND TIBC
Iron: 124 ug/dL (ref 41–142)
Saturation Ratios: 50 % (ref 21–57)
TIBC: 249 ug/dL (ref 236–444)
UIBC: 125 ug/dL (ref 120–384)

## 2019-05-22 DIAGNOSIS — M0589 Other rheumatoid arthritis with rheumatoid factor of multiple sites: Secondary | ICD-10-CM | POA: Diagnosis not present

## 2019-06-09 ENCOUNTER — Other Ambulatory Visit: Payer: Self-pay | Admitting: Obstetrics and Gynecology

## 2019-06-09 DIAGNOSIS — Z1231 Encounter for screening mammogram for malignant neoplasm of breast: Secondary | ICD-10-CM

## 2019-06-15 ENCOUNTER — Other Ambulatory Visit: Payer: Medicare Other

## 2019-06-15 ENCOUNTER — Ambulatory Visit: Payer: Medicare Other | Admitting: Family

## 2019-06-16 DIAGNOSIS — M5116 Intervertebral disc disorders with radiculopathy, lumbar region: Secondary | ICD-10-CM | POA: Diagnosis not present

## 2019-07-10 ENCOUNTER — Inpatient Hospital Stay: Payer: Medicare Other

## 2019-07-10 ENCOUNTER — Inpatient Hospital Stay: Payer: Medicare Other | Attending: Hematology & Oncology

## 2019-07-10 ENCOUNTER — Other Ambulatory Visit: Payer: Self-pay

## 2019-07-10 ENCOUNTER — Inpatient Hospital Stay (HOSPITAL_BASED_OUTPATIENT_CLINIC_OR_DEPARTMENT_OTHER): Payer: Medicare Other | Admitting: Family

## 2019-07-10 ENCOUNTER — Telehealth: Payer: Self-pay | Admitting: Family

## 2019-07-10 ENCOUNTER — Encounter: Payer: Self-pay | Admitting: Family

## 2019-07-10 VITALS — BP 128/74 | HR 96 | Temp 97.1°F | Resp 19 | Ht 60.0 in | Wt 222.8 lb

## 2019-07-10 DIAGNOSIS — Z8572 Personal history of non-Hodgkin lymphomas: Secondary | ICD-10-CM | POA: Diagnosis not present

## 2019-07-10 DIAGNOSIS — R200 Anesthesia of skin: Secondary | ICD-10-CM | POA: Diagnosis not present

## 2019-07-10 DIAGNOSIS — K909 Intestinal malabsorption, unspecified: Secondary | ICD-10-CM

## 2019-07-10 DIAGNOSIS — D509 Iron deficiency anemia, unspecified: Secondary | ICD-10-CM | POA: Diagnosis not present

## 2019-07-10 DIAGNOSIS — N183 Chronic kidney disease, stage 3 unspecified: Secondary | ICD-10-CM

## 2019-07-10 DIAGNOSIS — M7989 Other specified soft tissue disorders: Secondary | ICD-10-CM | POA: Insufficient documentation

## 2019-07-10 DIAGNOSIS — D500 Iron deficiency anemia secondary to blood loss (chronic): Secondary | ICD-10-CM | POA: Diagnosis not present

## 2019-07-10 DIAGNOSIS — D631 Anemia in chronic kidney disease: Secondary | ICD-10-CM | POA: Diagnosis not present

## 2019-07-10 DIAGNOSIS — D573 Sickle-cell trait: Secondary | ICD-10-CM | POA: Diagnosis not present

## 2019-07-10 DIAGNOSIS — D638 Anemia in other chronic diseases classified elsewhere: Secondary | ICD-10-CM | POA: Diagnosis not present

## 2019-07-10 DIAGNOSIS — Z79899 Other long term (current) drug therapy: Secondary | ICD-10-CM | POA: Diagnosis not present

## 2019-07-10 DIAGNOSIS — R202 Paresthesia of skin: Secondary | ICD-10-CM | POA: Diagnosis not present

## 2019-07-10 LAB — CBC WITH DIFFERENTIAL (CANCER CENTER ONLY)
Abs Immature Granulocytes: 0.04 10*3/uL (ref 0.00–0.07)
Basophils Absolute: 0 10*3/uL (ref 0.0–0.1)
Basophils Relative: 0 %
Eosinophils Absolute: 0.1 10*3/uL (ref 0.0–0.5)
Eosinophils Relative: 1 %
HCT: 33.6 % — ABNORMAL LOW (ref 36.0–46.0)
Hemoglobin: 10.8 g/dL — ABNORMAL LOW (ref 12.0–15.0)
Immature Granulocytes: 1 %
Lymphocytes Relative: 28 %
Lymphs Abs: 1.9 10*3/uL (ref 0.7–4.0)
MCH: 34.6 pg — ABNORMAL HIGH (ref 26.0–34.0)
MCHC: 32.1 g/dL (ref 30.0–36.0)
MCV: 107.7 fL — ABNORMAL HIGH (ref 80.0–100.0)
Monocytes Absolute: 0.5 10*3/uL (ref 0.1–1.0)
Monocytes Relative: 8 %
Neutro Abs: 4.2 10*3/uL (ref 1.7–7.7)
Neutrophils Relative %: 62 %
Platelet Count: 161 10*3/uL (ref 150–400)
RBC: 3.12 MIL/uL — ABNORMAL LOW (ref 3.87–5.11)
RDW: 16.1 % — ABNORMAL HIGH (ref 11.5–15.5)
WBC Count: 6.7 10*3/uL (ref 4.0–10.5)
nRBC: 0 % (ref 0.0–0.2)

## 2019-07-10 LAB — CMP (CANCER CENTER ONLY)
ALT: 15 U/L (ref 0–44)
AST: 21 U/L (ref 15–41)
Albumin: 4.2 g/dL (ref 3.5–5.0)
Alkaline Phosphatase: 58 U/L (ref 38–126)
Anion gap: 11 (ref 5–15)
BUN: 16 mg/dL (ref 8–23)
CO2: 27 mmol/L (ref 22–32)
Calcium: 10.1 mg/dL (ref 8.9–10.3)
Chloride: 103 mmol/L (ref 98–111)
Creatinine: 1.35 mg/dL — ABNORMAL HIGH (ref 0.44–1.00)
GFR, Est AFR Am: 46 mL/min — ABNORMAL LOW (ref >60)
GFR, Estimated: 40 mL/min — ABNORMAL LOW (ref >60)
Glucose, Bld: 144 mg/dL — ABNORMAL HIGH (ref 70–99)
Potassium: 3.8 mmol/L (ref 3.5–5.1)
Sodium: 141 mmol/L (ref 135–145)
Total Bilirubin: 0.5 mg/dL (ref 0.3–1.2)
Total Protein: 6.6 g/dL (ref 6.5–8.1)

## 2019-07-10 LAB — RETICULOCYTES
Immature Retic Fract: 24.5 % — ABNORMAL HIGH (ref 2.3–15.9)
RBC.: 3.03 MIL/uL — ABNORMAL LOW (ref 3.87–5.11)
Retic Count, Absolute: 69.4 10*3/uL (ref 19.0–186.0)
Retic Ct Pct: 2.3 % (ref 0.4–3.1)

## 2019-07-10 MED ORDER — EPOETIN ALFA-EPBX 40000 UNIT/ML IJ SOLN
INTRAMUSCULAR | Status: AC
  Filled 2019-07-10: qty 1

## 2019-07-10 MED ORDER — EPOETIN ALFA-EPBX 40000 UNIT/ML IJ SOLN
40000.0000 [IU] | Freq: Once | INTRAMUSCULAR | Status: AC
  Administered 2019-07-10: 40000 [IU] via SUBCUTANEOUS

## 2019-07-10 NOTE — Telephone Encounter (Signed)
Appointments scheduled calendar printed per 1/4 los

## 2019-07-10 NOTE — Patient Instructions (Signed)

## 2019-07-10 NOTE — Progress Notes (Signed)
Hematology and Oncology Follow Up Visit  Helen Hardy [id number] 11-30-1950 69 y.o. 07/10/2019   Principle Diagnosis:  1. 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 Sickle cell trait 3. 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 Iron deficiency anemia  Current Therapy:   IV Iron as indicated - last dose received in December 2016 Retacrit 40,000 units sq for Hgb < 11 Folic acid 1 mg po daily   Interim History:  Helen Hardy is here today for follow-up and Retacrit injection. Hgb is 10.8 today.  She is doing well and has no complaints at this time.  She has not noted any episodes of bleeding. No bruising or petechiae.  No fever, chills, n/v, cough, rash, dizziness, SOB, chest pain, palpitations, abdominal pain or changes in bowel or bladder habits.  She has swelling and tenderness in her right leg that comes and goes. She gets steroid injection periodically in her lower back that she states help some. She also has associated numbness and tingling in her feet that comes and goes.  She ambulates with a cane for added support and denies any falls or syncopal episodes.   She has maintained a good appetite and is staying well hydrated. Her weight is stable.   ECOG Performance Status: 0 - Asymptomatic  Medications:  Allergies as of 07/10/2019      Reactions   Oxycodone Nausea And Vomiting      Medication List       Accurate as of July 10, 2019  2:08 PM. If you have any questions, ask your nurse or doctor.        estradiol 1 MG tablet Commonly known as: ESTRACE Take 1 mg by mouth daily.   folic acid 1 MG tablet Commonly known as: FOLVITE Take 2 mg by mouth daily.   furosemide 40 MG tablet Commonly known as: LASIX TAKE 1 TABLET BY MOUTH ONCE DAILY FOR 30 DAYS   gabapentin 300 MG capsule Commonly known as: NEURONTIN Take 300 mg by mouth 3 (three) times daily.   HYDROcodone-acetaminophen 10-325 MG tablet Commonly known as:  NORCO Take 1 tablet by mouth 3 (three) times daily as needed.   leflunomide 20 MG tablet Commonly known as: ARAVA Take 20 mg by mouth daily.   methocarbamol 500 MG tablet Commonly known as: ROBAXIN Take 750 mg by mouth 3 (three) times daily.   multivitamin tablet Take 1 tablet by mouth daily.   olmesartan 20 MG tablet Commonly known as: BENICAR Take 20 mg by mouth daily.   ondansetron 4 MG tablet Commonly known as: ZOFRAN Take 4 mg by mouth.   ondansetron 8 MG disintegrating tablet Commonly known as: Zofran ODT 48m ODT q4 hours prn nausea   Orencia 250 MG injection Generic drug: abatacept Inject 250 mg into the vein every 28 (twenty-eight) days.   oxyCODONE-acetaminophen 5-325 MG tablet Commonly known as: Percocet Take 1-2 tablets by mouth every 6 (six) hours as needed.   pantoprazole 40 MG tablet Commonly known as: PROTONIX Take 40 mg by mouth 2 (two) times daily.   Potassium Chloride ER 20 MEQ Tbcr TAKE 1 TABLET BY MOUTH ONCE DAILY WITH FOOD FOR 30 DAYS   predniSONE 10 MG tablet Commonly known as: DELTASONE Take 10 mg by mouth daily.   triamcinolone cream 0.1 % Commonly known as: KENALOG Apply 1 application topically as needed (for rash).   vitamin B-12 1000 MCG tablet Commonly known as: CYANOCOBALAMIN Take 1,000 mcg by mouth daily.  vitamin C 1000 MG tablet Take 1,000 mg by mouth daily.   Vitamin D3 25 MCG (1000 UT) Caps Take 2 capsules by mouth daily.       Allergies:  Allergies  Allergen Reactions  . Oxycodone Nausea And Vomiting    Past Medical History, Surgical history, Social history, and Family History were reviewed and updated.  Review of Systems: All other 10 point review of systems is negative.   Physical Exam:  height is 5' (1.524 m) and weight is 222 lb 12.8 oz (101.1 kg). Her temporal temperature is 97.1 F (36.2 C) (abnormal). Her blood pressure is 128/74 and her pulse is 96. Her respiration is 19 and oxygen saturation is  100%.   Wt Readings from Last 3 Encounters:  07/10/19 222 lb 12.8 oz (101.1 kg)  05/18/19 226 lb (102.5 kg)  04/20/19 233 lb (105.7 kg)    Ocular: Sclerae unicteric, pupils equal, round and reactive to light Ear-nose-throat: Oropharynx clear, dentition fair Lymphatic: No cervical or supraclavicular adenopathy Lungs no rales or rhonchi, good excursion bilaterally Heart regular rate and rhythm, no murmur appreciated Abd soft, nontender, positive bowel sounds, no liver or spleen tip palpated on exam, no fluid wave  MSK no focal spinal tenderness, no joint edema Neuro: non-focal, well-oriented, appropriate affect Breasts: Deferred   Lab Results  Component Value Date   WBC 6.7 07/10/2019   HGB 10.8 (L) 07/10/2019   HCT 33.6 (L) 07/10/2019   MCV 107.7 (H) 07/10/2019   PLT 161 07/10/2019   Lab Results  Component Value Date   FERRITIN 1,667 (H) 05/18/2019   IRON 124 05/18/2019   TIBC 249 05/18/2019   UIBC 125 05/18/2019   IRONPCTSAT 50 05/18/2019   Lab Results  Component Value Date   RETICCTPCT 2.3 07/10/2019   RBC 3.03 (L) 07/10/2019   RETICCTABS 41.0 06/21/2015   No results found for: KPAFRELGTCHN, LAMBDASER, KAPLAMBRATIO No results found for: Kandis Cocking, IGMSERUM No results found for: Odetta Pink, SPEI   Chemistry      Component Value Date/Time   NA 141 07/10/2019 1329   NA 144 04/29/2017 0929   K 3.8 07/10/2019 1329   K 4.0 04/29/2017 0929   CL 103 07/10/2019 1329   CL 103 06/21/2015 0800   CO2 27 07/10/2019 1329   CO2 25 04/29/2017 0929   BUN 16 07/10/2019 1329   BUN 17.7 04/29/2017 0929   CREATININE 1.35 (H) 07/10/2019 1329   CREATININE 0.8 04/29/2017 0929      Component Value Date/Time   CALCIUM 10.1 07/10/2019 1329   CALCIUM 9.3 04/29/2017 0929   ALKPHOS 58 07/10/2019 1329   ALKPHOS 55 04/29/2017 0929   AST 21 07/10/2019 1329   AST 19 04/29/2017 0929   ALT 15 07/10/2019 1329   ALT 16  04/29/2017 0929   BILITOT 0.5 07/10/2019 1329   BILITOT 0.43 04/29/2017 0929       Impression and Plan: Helen Hardy is a very pleasant 69yo African American female with 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 She also has intermittent iron deficiency anemia and sickle cell trait.  She received Retacrit today.  We will see what her iron studies look like and replace if needed.  We will see her again in 1 month.  She will contact our office with any questions or concerns. We can certainly see her sooner if needed.   Laverna Peace, NP 1/4/20212:08 PM

## 2019-07-11 LAB — IRON AND TIBC
Iron: 105 ug/dL (ref 41–142)
Saturation Ratios: 43 % (ref 21–57)
TIBC: 246 ug/dL (ref 236–444)
UIBC: 141 ug/dL (ref 120–384)

## 2019-07-11 LAB — FERRITIN: Ferritin: 1475 ng/mL — ABNORMAL HIGH (ref 11–307)

## 2019-07-18 DIAGNOSIS — Z961 Presence of intraocular lens: Secondary | ICD-10-CM | POA: Diagnosis not present

## 2019-07-18 DIAGNOSIS — I10 Essential (primary) hypertension: Secondary | ICD-10-CM | POA: Diagnosis not present

## 2019-07-18 DIAGNOSIS — H02831 Dermatochalasis of right upper eyelid: Secondary | ICD-10-CM | POA: Diagnosis not present

## 2019-07-18 DIAGNOSIS — H18413 Arcus senilis, bilateral: Secondary | ICD-10-CM | POA: Diagnosis not present

## 2019-07-20 ENCOUNTER — Other Ambulatory Visit: Payer: Medicare Other

## 2019-07-20 ENCOUNTER — Ambulatory Visit: Payer: Medicare Other | Admitting: Family

## 2019-07-20 DIAGNOSIS — M0589 Other rheumatoid arthritis with rheumatoid factor of multiple sites: Secondary | ICD-10-CM | POA: Diagnosis not present

## 2019-07-31 ENCOUNTER — Ambulatory Visit: Payer: Medicare Other

## 2019-08-02 DIAGNOSIS — A5131 Condyloma latum: Secondary | ICD-10-CM | POA: Diagnosis not present

## 2019-08-02 DIAGNOSIS — A630 Anogenital (venereal) warts: Secondary | ICD-10-CM | POA: Diagnosis not present

## 2019-08-04 ENCOUNTER — Other Ambulatory Visit: Payer: Self-pay

## 2019-08-04 ENCOUNTER — Ambulatory Visit
Admission: RE | Admit: 2019-08-04 | Discharge: 2019-08-04 | Disposition: A | Discharge status: Home / Self Care | Payer: Medicare Other | Source: Ambulatory Visit | Attending: Obstetrics and Gynecology | Admitting: Obstetrics and Gynecology

## 2019-08-04 DIAGNOSIS — Z1231 Encounter for screening mammogram for malignant neoplasm of breast: Secondary | ICD-10-CM

## 2019-08-09 DIAGNOSIS — A630 Anogenital (venereal) warts: Secondary | ICD-10-CM | POA: Diagnosis not present

## 2019-08-09 DIAGNOSIS — A5131 Condyloma latum: Secondary | ICD-10-CM | POA: Diagnosis not present

## 2019-08-10 ENCOUNTER — Inpatient Hospital Stay: Payer: Medicare Other | Attending: Hematology & Oncology

## 2019-08-10 ENCOUNTER — Inpatient Hospital Stay (HOSPITAL_BASED_OUTPATIENT_CLINIC_OR_DEPARTMENT_OTHER): Payer: Medicare Other | Admitting: Family

## 2019-08-10 ENCOUNTER — Encounter: Payer: Self-pay | Admitting: Family

## 2019-08-10 ENCOUNTER — Inpatient Hospital Stay: Payer: Medicare Other

## 2019-08-10 ENCOUNTER — Other Ambulatory Visit: Payer: Self-pay

## 2019-08-10 VITALS — BP 136/95 | HR 88 | Temp 97.7°F | Resp 18 | Ht 60.0 in | Wt 225.8 lb

## 2019-08-10 DIAGNOSIS — K909 Intestinal malabsorption, unspecified: Secondary | ICD-10-CM

## 2019-08-10 DIAGNOSIS — Z79899 Other long term (current) drug therapy: Secondary | ICD-10-CM | POA: Insufficient documentation

## 2019-08-10 DIAGNOSIS — D631 Anemia in chronic kidney disease: Secondary | ICD-10-CM | POA: Diagnosis not present

## 2019-08-10 DIAGNOSIS — N183 Chronic kidney disease, stage 3 unspecified: Secondary | ICD-10-CM

## 2019-08-10 DIAGNOSIS — D638 Anemia in other chronic diseases classified elsewhere: Secondary | ICD-10-CM | POA: Diagnosis not present

## 2019-08-10 DIAGNOSIS — M549 Dorsalgia, unspecified: Secondary | ICD-10-CM | POA: Diagnosis not present

## 2019-08-10 DIAGNOSIS — D573 Sickle-cell trait: Secondary | ICD-10-CM | POA: Diagnosis not present

## 2019-08-10 DIAGNOSIS — D500 Iron deficiency anemia secondary to blood loss (chronic): Secondary | ICD-10-CM | POA: Diagnosis not present

## 2019-08-10 DIAGNOSIS — D509 Iron deficiency anemia, unspecified: Secondary | ICD-10-CM | POA: Diagnosis not present

## 2019-08-10 DIAGNOSIS — E611 Iron deficiency: Secondary | ICD-10-CM | POA: Diagnosis not present

## 2019-08-10 DIAGNOSIS — N189 Chronic kidney disease, unspecified: Secondary | ICD-10-CM | POA: Insufficient documentation

## 2019-08-10 DIAGNOSIS — M7989 Other specified soft tissue disorders: Secondary | ICD-10-CM | POA: Insufficient documentation

## 2019-08-10 DIAGNOSIS — Z8572 Personal history of non-Hodgkin lymphomas: Secondary | ICD-10-CM | POA: Insufficient documentation

## 2019-08-10 LAB — CBC WITH DIFFERENTIAL (CANCER CENTER ONLY)
Abs Immature Granulocytes: 0.05 10*3/uL (ref 0.00–0.07)
Basophils Absolute: 0 10*3/uL (ref 0.0–0.1)
Basophils Relative: 0 %
Eosinophils Absolute: 0.1 10*3/uL (ref 0.0–0.5)
Eosinophils Relative: 1 %
HCT: 31.3 % — ABNORMAL LOW (ref 36.0–46.0)
Hemoglobin: 10 g/dL — ABNORMAL LOW (ref 12.0–15.0)
Immature Granulocytes: 1 %
Lymphocytes Relative: 30 %
Lymphs Abs: 1.8 10*3/uL (ref 0.7–4.0)
MCH: 35.1 pg — ABNORMAL HIGH (ref 26.0–34.0)
MCHC: 31.9 g/dL (ref 30.0–36.0)
MCV: 109.8 fL — ABNORMAL HIGH (ref 80.0–100.0)
Monocytes Absolute: 0.6 10*3/uL (ref 0.1–1.0)
Monocytes Relative: 10 %
Neutro Abs: 3.5 10*3/uL (ref 1.7–7.7)
Neutrophils Relative %: 58 %
Platelet Count: 158 10*3/uL (ref 150–400)
RBC: 2.85 MIL/uL — ABNORMAL LOW (ref 3.87–5.11)
RDW: 15.8 % — ABNORMAL HIGH (ref 11.5–15.5)
WBC Count: 6 10*3/uL (ref 4.0–10.5)
nRBC: 0 % (ref 0.0–0.2)

## 2019-08-10 LAB — CMP (CANCER CENTER ONLY)
ALT: 16 U/L (ref 0–44)
AST: 21 U/L (ref 15–41)
Albumin: 4.2 g/dL (ref 3.5–5.0)
Alkaline Phosphatase: 52 U/L (ref 38–126)
Anion gap: 9 (ref 5–15)
BUN: 20 mg/dL (ref 8–23)
CO2: 28 mmol/L (ref 22–32)
Calcium: 9.9 mg/dL (ref 8.9–10.3)
Chloride: 107 mmol/L (ref 98–111)
Creatinine: 1.17 mg/dL — ABNORMAL HIGH (ref 0.44–1.00)
GFR, Est AFR Am: 55 mL/min — ABNORMAL LOW (ref >60)
GFR, Estimated: 47 mL/min — ABNORMAL LOW (ref >60)
Glucose, Bld: 89 mg/dL (ref 70–99)
Potassium: 4.3 mmol/L (ref 3.5–5.1)
Sodium: 144 mmol/L (ref 135–145)
Total Bilirubin: 0.5 mg/dL (ref 0.3–1.2)
Total Protein: 6.5 g/dL (ref 6.5–8.1)

## 2019-08-10 LAB — RETICULOCYTES
Immature Retic Fract: 32.2 % — ABNORMAL HIGH (ref 2.3–15.9)
RBC.: 2.87 MIL/uL — ABNORMAL LOW (ref 3.87–5.11)
Retic Count, Absolute: 69.5 10*3/uL (ref 19.0–186.0)
Retic Ct Pct: 2.4 % (ref 0.4–3.1)

## 2019-08-10 MED ORDER — EPOETIN ALFA-EPBX 40000 UNIT/ML IJ SOLN
INTRAMUSCULAR | Status: AC
  Filled 2019-08-10: qty 1

## 2019-08-10 MED ORDER — EPOETIN ALFA-EPBX 40000 UNIT/ML IJ SOLN
40000.0000 [IU] | Freq: Once | INTRAMUSCULAR | Status: AC
  Administered 2019-08-10: 40000 [IU] via SUBCUTANEOUS

## 2019-08-10 NOTE — Patient Instructions (Signed)

## 2019-08-10 NOTE — Progress Notes (Signed)
Hematology and Oncology Follow Up Visit  Helen Hardy [id number] 03-11-1951 69 y.o. 08/10/2019   Principle Diagnosis:  1. 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 Sickle cell trait 3. 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 Iron deficiency anemia  Current Therapy:   IV Iron as indicated  Retacrit 40,000 unitssq for Hgb < 11 Folic acid 1 mg po daily   Interim History:  Helen Hardy is here today for follow-up and Retacrit. She is doing well but still has issues with back pain and states that she has been getting injections as needed.  She has had no issue with bleeding. No bruising or petechiae.  She states that she had history of clot with a port "years ago" but has had no other issues. She is on low dose estrogen to prevent hot flashes and night sweats and she states that she can not stand to go without it. I spoke with Dr. Marin Olp and he states that this is fine for her to continue.  No fever, chills, n/v, cough, rash, dizziness, SOB, chest pain, palpitations, abdominal pain or changes in bowel or bladder habits.  She has swelling in her legs that comes and goes. She states that her diuretic helps reduce the fluid retention.  No falls or syncopal episodes to report.  No numbness or tingling in her extremities.  She states that she has maintained a good appetite and is staying well hydrated. Her weight is stable.   ECOG Performance Status: 1 - Symptomatic but completely ambulatory  Medications:  Allergies as of 08/10/2019      Reactions   Oxycodone Nausea And Vomiting      Medication List       Accurate as of August 10, 2019 11:34 AM. If you have any questions, ask your nurse or doctor.        estradiol 1 MG tablet Commonly known as: ESTRACE Take 1 mg by mouth daily.   folic acid 1 MG tablet Commonly known as: FOLVITE Take 2 mg by mouth daily.   furosemide 40 MG tablet Commonly known as: LASIX TAKE 1 TABLET BY MOUTH ONCE  DAILY FOR 30 DAYS   gabapentin 300 MG capsule Commonly known as: NEURONTIN Take 300 mg by mouth 3 (three) times daily.   HYDROcodone-acetaminophen 10-325 MG tablet Commonly known as: NORCO Take 1 tablet by mouth 3 (three) times daily as needed.   leflunomide 20 MG tablet Commonly known as: ARAVA Take 20 mg by mouth daily.   methocarbamol 500 MG tablet Commonly known as: ROBAXIN Take 750 mg by mouth 3 (three) times daily.   multivitamin tablet Take 1 tablet by mouth daily.   olmesartan 20 MG tablet Commonly known as: BENICAR Take 20 mg by mouth daily.   ondansetron 4 MG tablet Commonly known as: ZOFRAN Take 4 mg by mouth.   ondansetron 8 MG disintegrating tablet Commonly known as: Zofran ODT 60m ODT q4 hours prn nausea   Orencia 250 MG injection Generic drug: abatacept Inject 250 mg into the vein every 28 (twenty-eight) days.   oxyCODONE-acetaminophen 5-325 MG tablet Commonly known as: Percocet Take 1-2 tablets by mouth every 6 (six) hours as needed.   pantoprazole 40 MG tablet Commonly known as: PROTONIX Take 40 mg by mouth 2 (two) times daily.   Potassium Chloride ER 20 MEQ Tbcr TAKE 1 TABLET BY MOUTH ONCE DAILY WITH FOOD FOR 30 DAYS   predniSONE 10 MG tablet Commonly known as: DELTASONE Take 10 mg  by mouth daily.   triamcinolone cream 0.1 % Commonly known as: KENALOG Apply 1 application topically as needed (for rash).   vitamin B-12 1000 MCG tablet Commonly known as: CYANOCOBALAMIN Take 1,000 mcg by mouth daily.   vitamin C 1000 MG tablet Take 1,000 mg by mouth daily.   Vitamin D3 25 MCG (1000 UT) Caps Take 2 capsules by mouth daily.       Allergies:  Allergies  Allergen Reactions  . Oxycodone Nausea And Vomiting    Past Medical History, Surgical history, Social history, and Family History were reviewed and updated.  Review of Systems: All other 10 point review of systems is negative.   Physical Exam:  height is 5' (1.524 m) and  weight is 225 lb 12.8 oz (102.4 kg). Her temporal temperature is 97.7 F (36.5 C). Her blood pressure is 136/95 (abnormal) and her pulse is 88. Her respiration is 18 and oxygen saturation is 99%.   Wt Readings from Last 3 Encounters:  08/10/19 225 lb 12.8 oz (102.4 kg)  07/10/19 222 lb 12.8 oz (101.1 kg)  05/18/19 226 lb (102.5 kg)    Ocular: Sclerae unicteric, pupils equal, round and reactive to light Ear-nose-throat: Oropharynx clear, dentition fair Lymphatic: No cervical or supraclavicular adenopathy Lungs no rales or rhonchi, good excursion bilaterally Heart regular rate and rhythm, no murmur appreciated Abd soft, nontender, positive bowel sounds, no liver or spleen tip palpated on exam, no fluid wave  MSK no focal spinal tenderness, no joint edema Neuro: non-focal, well-oriented, appropriate affect Breasts: Deferred   Lab Results  Component Value Date   WBC 6.0 08/10/2019   HGB 10.0 (L) 08/10/2019   HCT 31.3 (L) 08/10/2019   MCV 109.8 (H) 08/10/2019   PLT 158 08/10/2019   Lab Results  Component Value Date   FERRITIN 1,475 (H) 07/10/2019   IRON 105 07/10/2019   TIBC 246 07/10/2019   UIBC 141 07/10/2019   IRONPCTSAT 43 07/10/2019   Lab Results  Component Value Date   RETICCTPCT 2.4 08/10/2019   RBC 2.87 (L) 08/10/2019   RBC 2.85 (L) 08/10/2019   RETICCTABS 41.0 06/21/2015   No results found for: KPAFRELGTCHN, LAMBDASER, KAPLAMBRATIO No results found for: Kandis Cocking, IGMSERUM No results found for: Odetta Pink, SPEI   Chemistry      Component Value Date/Time   NA 141 07/10/2019 1329   NA 144 04/29/2017 0929   K 3.8 07/10/2019 1329   K 4.0 04/29/2017 0929   CL 103 07/10/2019 1329   CL 103 06/21/2015 0800   CO2 27 07/10/2019 1329   CO2 25 04/29/2017 0929   BUN 16 07/10/2019 1329   BUN 17.7 04/29/2017 0929   CREATININE 1.35 (H) 07/10/2019 1329   CREATININE 0.8 04/29/2017 0929      Component Value  Date/Time   CALCIUM 10.1 07/10/2019 1329   CALCIUM 9.3 04/29/2017 0929   ALKPHOS 58 07/10/2019 1329   ALKPHOS 55 04/29/2017 0929   AST 21 07/10/2019 1329   AST 19 04/29/2017 0929   ALT 15 07/10/2019 1329   ALT 16 04/29/2017 0929   BILITOT 0.5 07/10/2019 1329   BILITOT 0.43 04/29/2017 0929       Impression and Plan: Helen Hardy is a very pleasant 69yo African American female with 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 She also has intermittent iron deficiency anemia and sickle cell trait. She received her Retacrit today for Hgb of 10.0.  Iron studies  are pending. We will replace if needed.  We will see her again in 1 month.  She will contact our office with any questions or concerns. We can certainly see her sooner if needed.   Laverna Peace, NP 2/4/202111:34 AM

## 2019-08-11 LAB — FERRITIN: Ferritin: 1225 ng/mL — ABNORMAL HIGH (ref 11–307)

## 2019-08-11 LAB — IRON AND TIBC
Iron: 109 ug/dL (ref 41–142)
Saturation Ratios: 41 % (ref 21–57)
TIBC: 265 ug/dL (ref 236–444)
UIBC: 156 ug/dL (ref 120–384)

## 2019-08-14 DIAGNOSIS — G894 Chronic pain syndrome: Secondary | ICD-10-CM | POA: Diagnosis not present

## 2019-08-21 DIAGNOSIS — M0579 Rheumatoid arthritis with rheumatoid factor of multiple sites without organ or systems involvement: Secondary | ICD-10-CM | POA: Diagnosis not present

## 2019-08-23 DIAGNOSIS — A630 Anogenital (venereal) warts: Secondary | ICD-10-CM | POA: Diagnosis not present

## 2019-08-23 DIAGNOSIS — Z79890 Hormone replacement therapy (postmenopausal): Secondary | ICD-10-CM | POA: Diagnosis not present

## 2019-08-23 DIAGNOSIS — A5131 Condyloma latum: Secondary | ICD-10-CM | POA: Diagnosis not present

## 2019-08-30 DIAGNOSIS — A5131 Condyloma latum: Secondary | ICD-10-CM | POA: Diagnosis not present

## 2019-08-30 DIAGNOSIS — A630 Anogenital (venereal) warts: Secondary | ICD-10-CM | POA: Diagnosis not present

## 2019-08-31 DIAGNOSIS — I10 Essential (primary) hypertension: Secondary | ICD-10-CM | POA: Diagnosis not present

## 2019-09-06 DIAGNOSIS — A630 Anogenital (venereal) warts: Secondary | ICD-10-CM | POA: Diagnosis not present

## 2019-09-07 ENCOUNTER — Other Ambulatory Visit: Payer: Self-pay

## 2019-09-07 ENCOUNTER — Inpatient Hospital Stay: Payer: Medicare Other | Attending: Hematology & Oncology

## 2019-09-07 ENCOUNTER — Inpatient Hospital Stay: Payer: Medicare Other

## 2019-09-07 ENCOUNTER — Encounter: Payer: Self-pay | Admitting: Family

## 2019-09-07 ENCOUNTER — Inpatient Hospital Stay (HOSPITAL_BASED_OUTPATIENT_CLINIC_OR_DEPARTMENT_OTHER): Payer: Medicare Other | Admitting: Family

## 2019-09-07 VITALS — BP 155/85 | HR 74 | Temp 97.1°F | Resp 18 | Ht 60.0 in | Wt 232.0 lb

## 2019-09-07 DIAGNOSIS — N189 Chronic kidney disease, unspecified: Secondary | ICD-10-CM | POA: Insufficient documentation

## 2019-09-07 DIAGNOSIS — M129 Arthropathy, unspecified: Secondary | ICD-10-CM | POA: Insufficient documentation

## 2019-09-07 DIAGNOSIS — N183 Chronic kidney disease, stage 3 unspecified: Secondary | ICD-10-CM | POA: Diagnosis not present

## 2019-09-07 DIAGNOSIS — I129 Hypertensive chronic kidney disease with stage 1 through stage 4 chronic kidney disease, or unspecified chronic kidney disease: Secondary | ICD-10-CM | POA: Insufficient documentation

## 2019-09-07 DIAGNOSIS — Z79899 Other long term (current) drug therapy: Secondary | ICD-10-CM | POA: Diagnosis not present

## 2019-09-07 DIAGNOSIS — D631 Anemia in chronic kidney disease: Secondary | ICD-10-CM | POA: Insufficient documentation

## 2019-09-07 DIAGNOSIS — D500 Iron deficiency anemia secondary to blood loss (chronic): Secondary | ICD-10-CM

## 2019-09-07 DIAGNOSIS — C8300 Small cell B-cell lymphoma, unspecified site: Secondary | ICD-10-CM | POA: Diagnosis not present

## 2019-09-07 DIAGNOSIS — D509 Iron deficiency anemia, unspecified: Secondary | ICD-10-CM | POA: Insufficient documentation

## 2019-09-07 DIAGNOSIS — D573 Sickle-cell trait: Secondary | ICD-10-CM | POA: Insufficient documentation

## 2019-09-07 DIAGNOSIS — K909 Intestinal malabsorption, unspecified: Secondary | ICD-10-CM

## 2019-09-07 LAB — CMP (CANCER CENTER ONLY)
ALT: 14 U/L (ref 0–44)
AST: 20 U/L (ref 15–41)
Albumin: 4.3 g/dL (ref 3.5–5.0)
Alkaline Phosphatase: 52 U/L (ref 38–126)
Anion gap: 9 (ref 5–15)
BUN: 18 mg/dL (ref 8–23)
CO2: 25 mmol/L (ref 22–32)
Calcium: 9.3 mg/dL (ref 8.9–10.3)
Chloride: 110 mmol/L (ref 98–111)
Creatinine: 0.99 mg/dL (ref 0.44–1.00)
GFR, Est AFR Am: >60 mL/min (ref >60)
GFR, Estimated: 58 mL/min — ABNORMAL LOW (ref >60)
Glucose, Bld: 110 mg/dL — ABNORMAL HIGH (ref 70–99)
Potassium: 3.8 mmol/L (ref 3.5–5.1)
Sodium: 144 mmol/L (ref 135–145)
Total Bilirubin: 0.5 mg/dL (ref 0.3–1.2)
Total Protein: 6.5 g/dL (ref 6.5–8.1)

## 2019-09-07 LAB — CBC WITH DIFFERENTIAL (CANCER CENTER ONLY)
Abs Immature Granulocytes: 0.05 10*3/uL (ref 0.00–0.07)
Basophils Absolute: 0 10*3/uL (ref 0.0–0.1)
Basophils Relative: 0 %
Eosinophils Absolute: 0 10*3/uL (ref 0.0–0.5)
Eosinophils Relative: 1 %
HCT: 30.9 % — ABNORMAL LOW (ref 36.0–46.0)
Hemoglobin: 9.9 g/dL — ABNORMAL LOW (ref 12.0–15.0)
Immature Granulocytes: 1 %
Lymphocytes Relative: 29 %
Lymphs Abs: 1.6 10*3/uL (ref 0.7–4.0)
MCH: 35.1 pg — ABNORMAL HIGH (ref 26.0–34.0)
MCHC: 32 g/dL (ref 30.0–36.0)
MCV: 109.6 fL — ABNORMAL HIGH (ref 80.0–100.0)
Monocytes Absolute: 0.4 10*3/uL (ref 0.1–1.0)
Monocytes Relative: 8 %
Neutro Abs: 3.3 10*3/uL (ref 1.7–7.7)
Neutrophils Relative %: 61 %
Platelet Count: 145 10*3/uL — ABNORMAL LOW (ref 150–400)
RBC: 2.82 MIL/uL — ABNORMAL LOW (ref 3.87–5.11)
RDW: 16.2 % — ABNORMAL HIGH (ref 11.5–15.5)
WBC Count: 5.4 10*3/uL (ref 4.0–10.5)
nRBC: 0 % (ref 0.0–0.2)

## 2019-09-07 LAB — RETICULOCYTES
Immature Retic Fract: 25 % — ABNORMAL HIGH (ref 2.3–15.9)
RBC.: 2.8 MIL/uL — ABNORMAL LOW (ref 3.87–5.11)
Retic Count, Absolute: 63.3 10*3/uL (ref 19.0–186.0)
Retic Ct Pct: 2.3 % (ref 0.4–3.1)

## 2019-09-07 MED ORDER — EPOETIN ALFA-EPBX 40000 UNIT/ML IJ SOLN
40000.0000 [IU] | Freq: Once | INTRAMUSCULAR | Status: AC
  Administered 2019-09-07: 40000 [IU] via SUBCUTANEOUS

## 2019-09-07 MED ORDER — EPOETIN ALFA-EPBX 40000 UNIT/ML IJ SOLN
INTRAMUSCULAR | Status: AC
  Filled 2019-09-07: qty 1

## 2019-09-07 NOTE — Progress Notes (Signed)
Hematology and Oncology Follow Up Visit  Helen Hardy [id number] Feb 16, 1951 69 y.o. 09/07/2019   Principle Diagnosis:  1. 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 Sickle cell trait 3. 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 Iron deficiency anemia  Current Therapy:   IV Iron as indicated  Retacrit 40,000 unitssq for Hgb < 11 Folic acid 1 mg po daily   Interim History:  Helen Hardy is here today for follow-up. She is doing well but has some fatigue at times.  Hgb is 9.9, MCV 109.  She has not noted any episodes of bleeding. No bruising or petechiae.  No fever, chills, n/v, cough, rash, dizziness, SOB, chest pain, palpitations, abdominal pain or changes in bowel or bladder habits.  She has chronic swelling in the knees and tenderness that waxes and wanes.  Generalized aches and pains due to arthritis.  No falls or syncopal episodes to report.  She has maintained a good appetite and is staying well hydrated. Her weight is stable.   ECOG Performance Status: 1 - Symptomatic but completely ambulatory  Medications:  Allergies as of 09/07/2019      Reactions   Oxycodone Nausea And Vomiting      Medication List       Accurate as of September 07, 2019 11:31 AM. If you have any questions, ask your nurse or doctor.        estradiol 1 MG tablet Commonly known as: ESTRACE Take 1 mg by mouth daily.   folic acid 1 MG tablet Commonly known as: FOLVITE Take 2 mg by mouth daily.   furosemide 40 MG tablet Commonly known as: LASIX TAKE 1 TABLET BY MOUTH ONCE DAILY FOR 30 DAYS   gabapentin 300 MG capsule Commonly known as: NEURONTIN Take 300 mg by mouth 3 (three) times daily.   HYDROcodone-acetaminophen 10-325 MG tablet Commonly known as: NORCO Take 1 tablet by mouth 3 (three) times daily as needed.   leflunomide 20 MG tablet Commonly known as: ARAVA Take 20 mg by mouth daily.   methocarbamol 500 MG tablet Commonly known as:  ROBAXIN Take 750 mg by mouth 3 (three) times daily.   multivitamin tablet Take 1 tablet by mouth daily.   olmesartan 20 MG tablet Commonly known as: BENICAR Take 20 mg by mouth daily.   ondansetron 4 MG tablet Commonly known as: ZOFRAN Take 4 mg by mouth.   ondansetron 8 MG disintegrating tablet Commonly known as: Zofran ODT 91m ODT q4 hours prn nausea   Orencia 250 MG injection Generic drug: abatacept Inject 250 mg into the vein every 28 (twenty-eight) days.   oxyCODONE-acetaminophen 5-325 MG tablet Commonly known as: Percocet Take 1-2 tablets by mouth every 6 (six) hours as needed.   pantoprazole 40 MG tablet Commonly known as: PROTONIX Take 40 mg by mouth 2 (two) times daily.   Potassium Chloride ER 20 MEQ Tbcr TAKE 1 TABLET BY MOUTH ONCE DAILY WITH FOOD FOR 30 DAYS   predniSONE 10 MG tablet Commonly known as: DELTASONE Take 10 mg by mouth daily.   triamcinolone cream 0.1 % Commonly known as: KENALOG Apply 1 application topically as needed (for rash).   vitamin B-12 1000 MCG tablet Commonly known as: CYANOCOBALAMIN Take 1,000 mcg by mouth daily.   vitamin C 1000 MG tablet Take 1,000 mg by mouth daily.   Vitamin D3 25 MCG (1000 UT) Caps Take 2 capsules by mouth daily.       Allergies:  Allergies  Allergen  Reactions  . Oxycodone Nausea And Vomiting    Past Medical History, Surgical history, Social history, and Family History were reviewed and updated.  Review of Systems: All other 10 point review of systems is negative.   Physical Exam:  vitals were not taken for this visit.   Wt Readings from Last 3 Encounters:  08/10/19 225 lb 12.8 oz (102.4 kg)  07/10/19 222 lb 12.8 oz (101.1 kg)  05/18/19 226 lb (102.5 kg)    Ocular: Sclerae unicteric, pupils equal, round and reactive to light Ear-nose-throat: Oropharynx clear, dentition fair Lymphatic: No cervical or supraclavicular adenopathy Lungs no rales or rhonchi, good excursion  bilaterally Heart regular rate and rhythm, no murmur appreciated Abd soft, nontender, positive bowel sounds, no liver or spleen tip palpated on exam, no fluid wave  MSK no focal spinal tenderness, no joint edema Neuro: non-focal, well-oriented, appropriate affect Breasts: Deferred   Lab Results  Component Value Date   WBC 5.4 09/07/2019   HGB 9.9 (L) 09/07/2019   HCT 30.9 (L) 09/07/2019   MCV 109.6 (H) 09/07/2019   PLT 145 (L) 09/07/2019   Lab Results  Component Value Date   FERRITIN 1,225 (H) 08/10/2019   IRON 109 08/10/2019   TIBC 265 08/10/2019   UIBC 156 08/10/2019   IRONPCTSAT 41 08/10/2019   Lab Results  Component Value Date   RETICCTPCT 2.3 09/07/2019   RBC 2.82 (L) 09/07/2019   RETICCTABS 41.0 06/21/2015   No results found for: KPAFRELGTCHN, LAMBDASER, KAPLAMBRATIO No results found for: IGGSERUM, IGA, IGMSERUM No results found for: Odetta Pink, SPEI   Chemistry      Component Value Date/Time   NA 144 08/10/2019 1105   NA 144 04/29/2017 0929   K 4.3 08/10/2019 1105   K 4.0 04/29/2017 0929   CL 107 08/10/2019 1105   CL 103 06/21/2015 0800   CO2 28 08/10/2019 1105   CO2 25 04/29/2017 0929   BUN 20 08/10/2019 1105   BUN 17.7 04/29/2017 0929   CREATININE 1.17 (H) 08/10/2019 1105   CREATININE 0.8 04/29/2017 0929      Component Value Date/Time   CALCIUM 9.9 08/10/2019 1105   CALCIUM 9.3 04/29/2017 0929   ALKPHOS 52 08/10/2019 1105   ALKPHOS 55 04/29/2017 0929   AST 21 08/10/2019 1105   AST 19 04/29/2017 0929   ALT 16 08/10/2019 1105   ALT 16 04/29/2017 0929   BILITOT 0.5 08/10/2019 1105   BILITOT 0.43 04/29/2017 0929       Impression and Plan: Helen Hardy is a very pleasant 69yo African American female with 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 She also has intermittent iron deficiency anemia and sickle cell trait. Retacrit given today for Hgb 9.9.  Iron  studies are pending. We will replace if needed.  We will plan to see her back in another month.  She will contact our office with any questions or concerns. We can certainly see her sooner if needed.   Laverna Peace, NP 3/4/202111:31 AM

## 2019-09-07 NOTE — Patient Instructions (Signed)

## 2019-09-08 LAB — FERRITIN: Ferritin: 1064 ng/mL — ABNORMAL HIGH (ref 11–307)

## 2019-09-08 LAB — IRON AND TIBC
Iron: 83 ug/dL (ref 41–142)
Saturation Ratios: 32 % (ref 21–57)
TIBC: 259 ug/dL (ref 236–444)
UIBC: 176 ug/dL (ref 120–384)

## 2019-09-13 DIAGNOSIS — A630 Anogenital (venereal) warts: Secondary | ICD-10-CM | POA: Diagnosis not present

## 2019-09-18 DIAGNOSIS — M0579 Rheumatoid arthritis with rheumatoid factor of multiple sites without organ or systems involvement: Secondary | ICD-10-CM | POA: Diagnosis not present

## 2019-09-26 DIAGNOSIS — M25571 Pain in right ankle and joints of right foot: Secondary | ICD-10-CM | POA: Diagnosis not present

## 2019-09-26 DIAGNOSIS — M65871 Other synovitis and tenosynovitis, right ankle and foot: Secondary | ICD-10-CM | POA: Diagnosis not present

## 2019-09-27 DIAGNOSIS — A5131 Condyloma latum: Secondary | ICD-10-CM | POA: Diagnosis not present

## 2019-10-05 ENCOUNTER — Other Ambulatory Visit: Payer: Self-pay

## 2019-10-05 ENCOUNTER — Telehealth: Payer: Self-pay | Admitting: Family

## 2019-10-05 ENCOUNTER — Encounter: Payer: Self-pay | Admitting: Family

## 2019-10-05 ENCOUNTER — Inpatient Hospital Stay: Payer: Medicare Other

## 2019-10-05 ENCOUNTER — Inpatient Hospital Stay (HOSPITAL_BASED_OUTPATIENT_CLINIC_OR_DEPARTMENT_OTHER): Payer: Medicare Other | Admitting: Family

## 2019-10-05 ENCOUNTER — Inpatient Hospital Stay: Payer: Medicare Other | Attending: Hematology & Oncology

## 2019-10-05 VITALS — BP 158/72 | HR 76 | Temp 97.3°F | Resp 18 | Ht 60.0 in | Wt 235.1 lb

## 2019-10-05 DIAGNOSIS — Z8572 Personal history of non-Hodgkin lymphomas: Secondary | ICD-10-CM | POA: Diagnosis not present

## 2019-10-05 DIAGNOSIS — N183 Chronic kidney disease, stage 3 unspecified: Secondary | ICD-10-CM | POA: Diagnosis not present

## 2019-10-05 DIAGNOSIS — D500 Iron deficiency anemia secondary to blood loss (chronic): Secondary | ICD-10-CM

## 2019-10-05 DIAGNOSIS — Z79899 Other long term (current) drug therapy: Secondary | ICD-10-CM | POA: Diagnosis not present

## 2019-10-05 DIAGNOSIS — K909 Intestinal malabsorption, unspecified: Secondary | ICD-10-CM

## 2019-10-05 DIAGNOSIS — N189 Chronic kidney disease, unspecified: Secondary | ICD-10-CM | POA: Insufficient documentation

## 2019-10-05 DIAGNOSIS — I129 Hypertensive chronic kidney disease with stage 1 through stage 4 chronic kidney disease, or unspecified chronic kidney disease: Secondary | ICD-10-CM | POA: Insufficient documentation

## 2019-10-05 DIAGNOSIS — D573 Sickle-cell trait: Secondary | ICD-10-CM | POA: Insufficient documentation

## 2019-10-05 DIAGNOSIS — M549 Dorsalgia, unspecified: Secondary | ICD-10-CM | POA: Insufficient documentation

## 2019-10-05 DIAGNOSIS — D509 Iron deficiency anemia, unspecified: Secondary | ICD-10-CM | POA: Diagnosis not present

## 2019-10-05 DIAGNOSIS — D631 Anemia in chronic kidney disease: Secondary | ICD-10-CM | POA: Diagnosis not present

## 2019-10-05 LAB — CBC WITH DIFFERENTIAL (CANCER CENTER ONLY)
Abs Immature Granulocytes: 0.08 10*3/uL — ABNORMAL HIGH (ref 0.00–0.07)
Basophils Absolute: 0 10*3/uL (ref 0.0–0.1)
Basophils Relative: 0 %
Eosinophils Absolute: 0.1 10*3/uL (ref 0.0–0.5)
Eosinophils Relative: 1 %
HCT: 32.1 % — ABNORMAL LOW (ref 36.0–46.0)
Hemoglobin: 10.4 g/dL — ABNORMAL LOW (ref 12.0–15.0)
Immature Granulocytes: 1 %
Lymphocytes Relative: 27 %
Lymphs Abs: 1.6 10*3/uL (ref 0.7–4.0)
MCH: 35 pg — ABNORMAL HIGH (ref 26.0–34.0)
MCHC: 32.4 g/dL (ref 30.0–36.0)
MCV: 108.1 fL — ABNORMAL HIGH (ref 80.0–100.0)
Monocytes Absolute: 0.5 10*3/uL (ref 0.1–1.0)
Monocytes Relative: 9 %
Neutro Abs: 3.5 10*3/uL (ref 1.7–7.7)
Neutrophils Relative %: 62 %
Platelet Count: 144 10*3/uL — ABNORMAL LOW (ref 150–400)
RBC: 2.97 MIL/uL — ABNORMAL LOW (ref 3.87–5.11)
RDW: 15.9 % — ABNORMAL HIGH (ref 11.5–15.5)
WBC Count: 5.7 10*3/uL (ref 4.0–10.5)
nRBC: 0 % (ref 0.0–0.2)

## 2019-10-05 LAB — CMP (CANCER CENTER ONLY)
ALT: 14 U/L (ref 0–44)
AST: 20 U/L (ref 15–41)
Albumin: 4 g/dL (ref 3.5–5.0)
Alkaline Phosphatase: 53 U/L (ref 38–126)
Anion gap: 8 (ref 5–15)
BUN: 17 mg/dL (ref 8–23)
CO2: 28 mmol/L (ref 22–32)
Calcium: 9.6 mg/dL (ref 8.9–10.3)
Chloride: 109 mmol/L (ref 98–111)
Creatinine: 0.99 mg/dL (ref 0.44–1.00)
GFR, Est AFR Am: >60 mL/min (ref >60)
GFR, Estimated: 58 mL/min — ABNORMAL LOW (ref >60)
Glucose, Bld: 97 mg/dL (ref 70–99)
Potassium: 4 mmol/L (ref 3.5–5.1)
Sodium: 145 mmol/L (ref 135–145)
Total Bilirubin: 0.5 mg/dL (ref 0.3–1.2)
Total Protein: 6.2 g/dL — ABNORMAL LOW (ref 6.5–8.1)

## 2019-10-05 LAB — RETICULOCYTES
Immature Retic Fract: 24.5 % — ABNORMAL HIGH (ref 2.3–15.9)
RBC.: 2.95 MIL/uL — ABNORMAL LOW (ref 3.87–5.11)
Retic Count, Absolute: 58.4 10*3/uL (ref 19.0–186.0)
Retic Ct Pct: 2 % (ref 0.4–3.1)

## 2019-10-05 MED ORDER — EPOETIN ALFA-EPBX 40000 UNIT/ML IJ SOLN
40000.0000 [IU] | Freq: Once | INTRAMUSCULAR | Status: AC
  Administered 2019-10-05: 40000 [IU] via SUBCUTANEOUS

## 2019-10-05 MED ORDER — EPOETIN ALFA-EPBX 40000 UNIT/ML IJ SOLN
INTRAMUSCULAR | Status: AC
  Filled 2019-10-05: qty 1

## 2019-10-05 NOTE — Patient Instructions (Signed)

## 2019-10-05 NOTE — Telephone Encounter (Signed)
Appointments scheduled calendar printed per 4/1 los

## 2019-10-05 NOTE — Progress Notes (Signed)
Hematology and Oncology Follow Up Visit  Helen Hardy [id number] March 21, 1951 69 y.o. 10/05/2019   Principle Diagnosis:  1. 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 Sickle cell trait 3. 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 Iron deficiency anemia  Current Therapy:        IV Iron as indicated  Retacrit 40,000 unitssq for Hgb < 11 Folic acid 1 mg po daily   Interim History:  Helen Hardy is here today for follow-up and Retacrit. She states that she still has some fatigue but feels this is due to her not sleeping because of back pain.  She is seeing Dr. Rolena Infante and feels that she may be needing surgery this year as the injections are no longer helping.  She has not noted any episodes of bleeding. No bruising or petechiae.  Hgb is improved at 10.4, MCV 108.  No fever, chills, n/v, cough, rash, dizziness, SOB, chest pain, palpitations, abdominal pain or changes in bowel or bladder habits.  No numbness or tingling in her extremities at this time. She has chronic swelling and tenderness in her knees that waves and wanes.  No falls or syncopal episodes to report.  She has maintained a good appetite and is staying well hydrated. Her weight is stable.   ECOG Performance Status: 1 - Symptomatic but completely ambulatory  Medications:  Allergies as of 10/05/2019      Reactions   Oxycodone Nausea And Vomiting      Medication List       Accurate as of October 05, 2019 11:53 AM. If you have any questions, ask your nurse or doctor.        estradiol 1 MG tablet Commonly known as: ESTRACE Take 1 mg by mouth daily.   folic acid 1 MG tablet Commonly known as: FOLVITE Take 2 mg by mouth daily.   furosemide 40 MG tablet Commonly known as: LASIX TAKE 1 TABLET BY MOUTH ONCE DAILY FOR 30 DAYS   gabapentin 300 MG capsule Commonly known as: NEURONTIN Take 300 mg by mouth 3 (three) times daily.   HYDROcodone-acetaminophen 10-325 MG  tablet Commonly known as: NORCO Take 1 tablet by mouth 3 (three) times daily as needed.   leflunomide 20 MG tablet Commonly known as: ARAVA Take 20 mg by mouth daily.   methocarbamol 500 MG tablet Commonly known as: ROBAXIN Take 750 mg by mouth 3 (three) times daily.   multivitamin tablet Take 1 tablet by mouth daily.   olmesartan 20 MG tablet Commonly known as: BENICAR Take 20 mg by mouth daily.   ondansetron 4 MG tablet Commonly known as: ZOFRAN Take 4 mg by mouth.   ondansetron 8 MG disintegrating tablet Commonly known as: Zofran ODT 79m ODT q4 hours prn nausea   oxyCODONE-acetaminophen 5-325 MG tablet Commonly known as: Percocet Take 1-2 tablets by mouth every 6 (six) hours as needed.   pantoprazole 40 MG tablet Commonly known as: PROTONIX Take 40 mg by mouth 2 (two) times daily.   Potassium Chloride ER 20 MEQ Tbcr TAKE 1 TABLET BY MOUTH ONCE DAILY WITH FOOD FOR 30 DAYS   predniSONE 10 MG tablet Commonly known as: DELTASONE Take 10 mg by mouth daily.   triamcinolone cream 0.1 % Commonly known as: KENALOG Apply 1 application topically as needed (for rash).   vitamin B-12 1000 MCG tablet Commonly known as: CYANOCOBALAMIN Take 1,000 mcg by mouth daily.   vitamin C 1000 MG tablet Take 1,000 mg by mouth daily.  Vitamin D3 25 MCG (1000 UT) Caps Take 2 capsules by mouth daily.       Allergies:  Allergies  Allergen Reactions  . Oxycodone Nausea And Vomiting    Past Medical History, Surgical history, Social history, and Family History were reviewed and updated.  Review of Systems: All other 10 point review of systems is negative.   Physical Exam:  height is 5' (1.524 m) and weight is 235 lb 1.3 oz (106.6 kg). Her temporal temperature is 97.3 F (36.3 C) (abnormal). Her blood pressure is 158/72 (abnormal) and her pulse is 76. Her respiration is 18 and oxygen saturation is 100%.   Wt Readings from Last 3 Encounters:  10/05/19 235 lb 1.3 oz (106.6  kg)  09/07/19 232 lb 0.6 oz (105.3 kg)  08/10/19 225 lb 12.8 oz (102.4 kg)    Ocular: Sclerae unicteric, pupils equal, round and reactive to light Ear-nose-throat: Oropharynx clear, dentition fair Lymphatic: No cervical or supraclavicular adenopathy Lungs no rales or rhonchi, good excursion bilaterally Heart regular rate and rhythm, no murmur appreciated Abd soft, nontender, positive bowel sounds, no liver or spleen tip palpated on exam, no fluid wave  MSK no focal spinal tenderness, no joint edema Neuro: non-focal, well-oriented, appropriate affect Breasts: Deferred   Lab Results  Component Value Date   WBC 5.7 10/05/2019   HGB 10.4 (L) 10/05/2019   HCT 32.1 (L) 10/05/2019   MCV 108.1 (H) 10/05/2019   PLT 144 (L) 10/05/2019   Lab Results  Component Value Date   FERRITIN 1,064 (H) 09/07/2019   IRON 83 09/07/2019   TIBC 259 09/07/2019   UIBC 176 09/07/2019   IRONPCTSAT 32 09/07/2019   Lab Results  Component Value Date   RETICCTPCT 2.0 10/05/2019   RBC 2.95 (L) 10/05/2019   RETICCTABS 41.0 06/21/2015   No results found for: KPAFRELGTCHN, LAMBDASER, KAPLAMBRATIO No results found for: Kandis Cocking, IGMSERUM No results found for: Odetta Pink, SPEI   Chemistry      Component Value Date/Time   NA 145 10/05/2019 1049   NA 144 04/29/2017 0929   K 4.0 10/05/2019 1049   K 4.0 04/29/2017 0929   CL 109 10/05/2019 1049   CL 103 06/21/2015 0800   CO2 28 10/05/2019 1049   CO2 25 04/29/2017 0929   BUN 17 10/05/2019 1049   BUN 17.7 04/29/2017 0929   CREATININE 0.99 10/05/2019 1049   CREATININE 0.8 04/29/2017 0929      Component Value Date/Time   CALCIUM 9.6 10/05/2019 1049   CALCIUM 9.3 04/29/2017 0929   ALKPHOS 53 10/05/2019 1049   ALKPHOS 55 04/29/2017 0929   AST 20 10/05/2019 1049   AST 19 04/29/2017 0929   ALT 14 10/05/2019 1049   ALT 16 04/29/2017 0929   BILITOT 0.5 10/05/2019 1049   BILITOT 0.43 04/29/2017  0929       Impression and Plan: Helen Hardy is a very pleasant 69yo African American female with 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 She also has intermittent iron deficiency anemia and sickle cell trait. She did receive Retacrit for Hgb 10.4.  Iron studies are pending. We can replace if needed.  We will see her back for lab and injection appointment in 3 weeks and follow-up in 6 weeks.  She will contact our office with any questions or concerns. We can certainly see her sooner if needed.   Laverna Peace, NP 4/1/202111:53 AM

## 2019-10-06 LAB — IRON AND TIBC
Iron: 97 ug/dL (ref 41–142)
Saturation Ratios: 38 % (ref 21–57)
TIBC: 253 ug/dL (ref 236–444)
UIBC: 155 ug/dL (ref 120–384)

## 2019-10-06 LAB — FERRITIN: Ferritin: 828 ng/mL — ABNORMAL HIGH (ref 11–307)

## 2019-10-10 DIAGNOSIS — M722 Plantar fascial fibromatosis: Secondary | ICD-10-CM | POA: Diagnosis not present

## 2019-10-10 DIAGNOSIS — M25571 Pain in right ankle and joints of right foot: Secondary | ICD-10-CM | POA: Diagnosis not present

## 2019-10-13 DIAGNOSIS — M545 Low back pain: Secondary | ICD-10-CM | POA: Diagnosis not present

## 2019-10-16 DIAGNOSIS — M0579 Rheumatoid arthritis with rheumatoid factor of multiple sites without organ or systems involvement: Secondary | ICD-10-CM | POA: Diagnosis not present

## 2019-10-25 DIAGNOSIS — M5416 Radiculopathy, lumbar region: Secondary | ICD-10-CM | POA: Diagnosis not present

## 2019-10-26 ENCOUNTER — Inpatient Hospital Stay: Payer: Medicare Other

## 2019-10-26 ENCOUNTER — Other Ambulatory Visit: Payer: Self-pay

## 2019-10-26 DIAGNOSIS — Z79899 Other long term (current) drug therapy: Secondary | ICD-10-CM | POA: Diagnosis not present

## 2019-10-26 DIAGNOSIS — D631 Anemia in chronic kidney disease: Secondary | ICD-10-CM | POA: Diagnosis not present

## 2019-10-26 DIAGNOSIS — N189 Chronic kidney disease, unspecified: Secondary | ICD-10-CM | POA: Diagnosis not present

## 2019-10-26 DIAGNOSIS — D509 Iron deficiency anemia, unspecified: Secondary | ICD-10-CM | POA: Diagnosis not present

## 2019-10-26 DIAGNOSIS — Z8572 Personal history of non-Hodgkin lymphomas: Secondary | ICD-10-CM | POA: Diagnosis not present

## 2019-10-26 DIAGNOSIS — I129 Hypertensive chronic kidney disease with stage 1 through stage 4 chronic kidney disease, or unspecified chronic kidney disease: Secondary | ICD-10-CM | POA: Diagnosis not present

## 2019-10-26 DIAGNOSIS — D500 Iron deficiency anemia secondary to blood loss (chronic): Secondary | ICD-10-CM

## 2019-10-26 DIAGNOSIS — N183 Chronic kidney disease, stage 3 unspecified: Secondary | ICD-10-CM

## 2019-10-26 LAB — CMP (CANCER CENTER ONLY)
ALT: 16 U/L (ref 0–44)
AST: 21 U/L (ref 15–41)
Albumin: 4.1 g/dL (ref 3.5–5.0)
Alkaline Phosphatase: 60 U/L (ref 38–126)
Anion gap: 6 (ref 5–15)
BUN: 20 mg/dL (ref 8–23)
CO2: 29 mmol/L (ref 22–32)
Calcium: 9.9 mg/dL (ref 8.9–10.3)
Chloride: 108 mmol/L (ref 98–111)
Creatinine: 1.14 mg/dL — ABNORMAL HIGH (ref 0.44–1.00)
GFR, Est AFR Am: 56 mL/min — ABNORMAL LOW (ref >60)
GFR, Estimated: 49 mL/min — ABNORMAL LOW (ref >60)
Glucose, Bld: 103 mg/dL — ABNORMAL HIGH (ref 70–99)
Potassium: 4 mmol/L (ref 3.5–5.1)
Sodium: 143 mmol/L (ref 135–145)
Total Bilirubin: 0.5 mg/dL (ref 0.3–1.2)
Total Protein: 6.5 g/dL (ref 6.5–8.1)

## 2019-10-26 LAB — CBC WITH DIFFERENTIAL (CANCER CENTER ONLY)
Abs Immature Granulocytes: 0.03 10*3/uL (ref 0.00–0.07)
Basophils Absolute: 0 10*3/uL (ref 0.0–0.1)
Basophils Relative: 0 %
Eosinophils Absolute: 0 10*3/uL (ref 0.0–0.5)
Eosinophils Relative: 1 %
HCT: 34 % — ABNORMAL LOW (ref 36.0–46.0)
Hemoglobin: 11 g/dL — ABNORMAL LOW (ref 12.0–15.0)
Immature Granulocytes: 1 %
Lymphocytes Relative: 27 %
Lymphs Abs: 1.5 10*3/uL (ref 0.7–4.0)
MCH: 35 pg — ABNORMAL HIGH (ref 26.0–34.0)
MCHC: 32.4 g/dL (ref 30.0–36.0)
MCV: 108.3 fL — ABNORMAL HIGH (ref 80.0–100.0)
Monocytes Absolute: 0.5 10*3/uL (ref 0.1–1.0)
Monocytes Relative: 9 %
Neutro Abs: 3.6 10*3/uL (ref 1.7–7.7)
Neutrophils Relative %: 62 %
Platelet Count: 168 10*3/uL (ref 150–400)
RBC: 3.14 MIL/uL — ABNORMAL LOW (ref 3.87–5.11)
RDW: 16.5 % — ABNORMAL HIGH (ref 11.5–15.5)
WBC Count: 5.8 10*3/uL (ref 4.0–10.5)
nRBC: 0 % (ref 0.0–0.2)

## 2019-10-26 NOTE — Progress Notes (Signed)
No Retacrit today d/t order parameters. dph

## 2019-11-01 DIAGNOSIS — M961 Postlaminectomy syndrome, not elsewhere classified: Secondary | ICD-10-CM | POA: Diagnosis not present

## 2019-11-01 DIAGNOSIS — M5416 Radiculopathy, lumbar region: Secondary | ICD-10-CM | POA: Diagnosis not present

## 2019-11-01 DIAGNOSIS — G894 Chronic pain syndrome: Secondary | ICD-10-CM | POA: Diagnosis not present

## 2019-11-09 DIAGNOSIS — M25571 Pain in right ankle and joints of right foot: Secondary | ICD-10-CM | POA: Diagnosis not present

## 2019-11-09 DIAGNOSIS — M65871 Other synovitis and tenosynovitis, right ankle and foot: Secondary | ICD-10-CM | POA: Diagnosis not present

## 2019-11-09 DIAGNOSIS — M722 Plantar fascial fibromatosis: Secondary | ICD-10-CM | POA: Diagnosis not present

## 2019-11-10 DIAGNOSIS — Z6841 Body Mass Index (BMI) 40.0 and over, adult: Secondary | ICD-10-CM | POA: Diagnosis not present

## 2019-11-10 DIAGNOSIS — M545 Low back pain: Secondary | ICD-10-CM | POA: Diagnosis not present

## 2019-11-10 DIAGNOSIS — M961 Postlaminectomy syndrome, not elsewhere classified: Secondary | ICD-10-CM | POA: Diagnosis not present

## 2019-11-10 DIAGNOSIS — M5116 Intervertebral disc disorders with radiculopathy, lumbar region: Secondary | ICD-10-CM | POA: Diagnosis not present

## 2019-11-10 DIAGNOSIS — G894 Chronic pain syndrome: Secondary | ICD-10-CM | POA: Diagnosis not present

## 2019-11-10 DIAGNOSIS — M5416 Radiculopathy, lumbar region: Secondary | ICD-10-CM | POA: Diagnosis not present

## 2019-11-13 DIAGNOSIS — M0579 Rheumatoid arthritis with rheumatoid factor of multiple sites without organ or systems involvement: Secondary | ICD-10-CM | POA: Diagnosis not present

## 2019-11-14 DIAGNOSIS — M792 Neuralgia and neuritis, unspecified: Secondary | ICD-10-CM | POA: Diagnosis not present

## 2019-11-15 ENCOUNTER — Telehealth: Payer: Self-pay | Admitting: Family

## 2019-11-15 ENCOUNTER — Inpatient Hospital Stay: Payer: Medicare Other

## 2019-11-15 ENCOUNTER — Inpatient Hospital Stay (HOSPITAL_BASED_OUTPATIENT_CLINIC_OR_DEPARTMENT_OTHER): Payer: Medicare Other | Admitting: Family

## 2019-11-15 ENCOUNTER — Encounter: Payer: Self-pay | Admitting: Family

## 2019-11-15 ENCOUNTER — Other Ambulatory Visit: Payer: Self-pay

## 2019-11-15 ENCOUNTER — Inpatient Hospital Stay: Payer: Medicare Other | Attending: Hematology & Oncology

## 2019-11-15 VITALS — BP 196/113 | HR 83 | Temp 97.1°F | Resp 19 | Ht 60.0 in | Wt 235.1 lb

## 2019-11-15 DIAGNOSIS — N183 Chronic kidney disease, stage 3 unspecified: Secondary | ICD-10-CM

## 2019-11-15 DIAGNOSIS — D631 Anemia in chronic kidney disease: Secondary | ICD-10-CM | POA: Insufficient documentation

## 2019-11-15 DIAGNOSIS — M549 Dorsalgia, unspecified: Secondary | ICD-10-CM | POA: Diagnosis not present

## 2019-11-15 DIAGNOSIS — Z8572 Personal history of non-Hodgkin lymphomas: Secondary | ICD-10-CM | POA: Insufficient documentation

## 2019-11-15 DIAGNOSIS — N189 Chronic kidney disease, unspecified: Secondary | ICD-10-CM | POA: Diagnosis not present

## 2019-11-15 DIAGNOSIS — Z79899 Other long term (current) drug therapy: Secondary | ICD-10-CM | POA: Insufficient documentation

## 2019-11-15 DIAGNOSIS — R5383 Other fatigue: Secondary | ICD-10-CM | POA: Insufficient documentation

## 2019-11-15 DIAGNOSIS — D500 Iron deficiency anemia secondary to blood loss (chronic): Secondary | ICD-10-CM

## 2019-11-15 DIAGNOSIS — D573 Sickle-cell trait: Secondary | ICD-10-CM | POA: Diagnosis not present

## 2019-11-15 DIAGNOSIS — I129 Hypertensive chronic kidney disease with stage 1 through stage 4 chronic kidney disease, or unspecified chronic kidney disease: Secondary | ICD-10-CM | POA: Insufficient documentation

## 2019-11-15 LAB — CBC WITH DIFFERENTIAL (CANCER CENTER ONLY)
Abs Immature Granulocytes: 0.12 10*3/uL — ABNORMAL HIGH (ref 0.00–0.07)
Basophils Absolute: 0 10*3/uL (ref 0.0–0.1)
Basophils Relative: 0 %
Eosinophils Absolute: 0 10*3/uL (ref 0.0–0.5)
Eosinophils Relative: 0 %
HCT: 32.1 % — ABNORMAL LOW (ref 36.0–46.0)
Hemoglobin: 10.4 g/dL — ABNORMAL LOW (ref 12.0–15.0)
Immature Granulocytes: 2 %
Lymphocytes Relative: 24 %
Lymphs Abs: 1.5 10*3/uL (ref 0.7–4.0)
MCH: 35 pg — ABNORMAL HIGH (ref 26.0–34.0)
MCHC: 32.4 g/dL (ref 30.0–36.0)
MCV: 108.1 fL — ABNORMAL HIGH (ref 80.0–100.0)
Monocytes Absolute: 0.3 10*3/uL (ref 0.1–1.0)
Monocytes Relative: 5 %
Neutro Abs: 4.1 10*3/uL (ref 1.7–7.7)
Neutrophils Relative %: 69 %
Platelet Count: 154 10*3/uL (ref 150–400)
RBC: 2.97 MIL/uL — ABNORMAL LOW (ref 3.87–5.11)
RDW: 16.4 % — ABNORMAL HIGH (ref 11.5–15.5)
WBC Count: 6 10*3/uL (ref 4.0–10.5)
nRBC: 0 % (ref 0.0–0.2)

## 2019-11-15 LAB — FERRITIN: Ferritin: 911 ng/mL — ABNORMAL HIGH (ref 11–307)

## 2019-11-15 LAB — RETICULOCYTES
Immature Retic Fract: 29.2 % — ABNORMAL HIGH (ref 2.3–15.9)
RBC.: 2.98 MIL/uL — ABNORMAL LOW (ref 3.87–5.11)
Retic Count, Absolute: 69.1 10*3/uL (ref 19.0–186.0)
Retic Ct Pct: 2.3 % (ref 0.4–3.1)

## 2019-11-15 LAB — CMP (CANCER CENTER ONLY)
ALT: 16 U/L (ref 0–44)
AST: 19 U/L (ref 15–41)
Albumin: 4.2 g/dL (ref 3.5–5.0)
Alkaline Phosphatase: 52 U/L (ref 38–126)
Anion gap: 7 (ref 5–15)
BUN: 24 mg/dL — ABNORMAL HIGH (ref 8–23)
CO2: 28 mmol/L (ref 22–32)
Calcium: 9.9 mg/dL (ref 8.9–10.3)
Chloride: 109 mmol/L (ref 98–111)
Creatinine: 1.03 mg/dL — ABNORMAL HIGH (ref 0.44–1.00)
GFR, Est AFR Am: >60 mL/min (ref >60)
GFR, Estimated: 55 mL/min — ABNORMAL LOW (ref >60)
Glucose, Bld: 116 mg/dL — ABNORMAL HIGH (ref 70–99)
Potassium: 4.2 mmol/L (ref 3.5–5.1)
Sodium: 144 mmol/L (ref 135–145)
Total Bilirubin: 0.5 mg/dL (ref 0.3–1.2)
Total Protein: 6.2 g/dL — ABNORMAL LOW (ref 6.5–8.1)

## 2019-11-15 LAB — IRON AND TIBC
Iron: 104 ug/dL (ref 41–142)
Saturation Ratios: 40 % (ref 21–57)
TIBC: 259 ug/dL (ref 236–444)
UIBC: 155 ug/dL (ref 120–384)

## 2019-11-15 NOTE — Progress Notes (Signed)
Hematology and Oncology Follow Up Visit  Helen Hardy [id number] [birth year] 69 y.o. 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 is here today for follow-up. She is feeling fatigued today. She states that she has been seeing a lot of doctors lately.  Her BP today as quite elevated at 196/113 when she first got here. Once she had sat for a bit I retook manually and she had come down a little to 180/100.  She states that she has a lot of back pain and has chosen not to have surgery. She goes again tomorrow for steroid injection.  She has chronic swelling in her feet and ankles along with knee pain. No pitting edema noted on exam. Pedal pulses are 2+.  No fever, chills, n/v, cough, rash, dizziness, headaches, blurred/loss of vision, SOB, chest pain, palpitations, abdominal pain or changes in bowel or bladder habits.  No numbness or tingling in her extremities.  No falls or syncopal episodes to report. She ambulates with a cane for added support.  She has a good appetite and is staying well hydrated. Her weight is stable.   ECOG Performance Status: 1 - Symptomatic but completely ambulatory  Medications:  Allergies as of 11/15/2019      Reactions   Oxycodone Nausea And Vomiting      Medication List       Accurate as of Nov 15, 2019 10:09 AM. If you have any questions, ask your nurse or doctor.        estradiol 1 MG tablet Commonly known as: ESTRACE Take 1 mg by mouth daily.   folic acid 1 MG tablet Commonly known as: FOLVITE Take 2 mg by mouth daily.   furosemide 40 MG tablet Commonly known as: LASIX TAKE 1 TABLET BY MOUTH ONCE DAILY FOR 30 DAYS   gabapentin 300 MG  capsule Commonly known as: NEURONTIN Take 300 mg by mouth 3 (three) times daily.   HYDROcodone-acetaminophen 10-325 MG tablet Commonly known as: NORCO Take 1 tablet by mouth 3 (three) times daily as needed.   leflunomide 20 MG tablet Commonly known as: ARAVA Take 20 mg by mouth daily.   methocarbamol 500 MG tablet Commonly known as: ROBAXIN Take 750 mg by mouth 3 (three) times daily.   multivitamin tablet Take 1 tablet by mouth daily.   olmesartan 20 MG tablet Commonly known as: BENICAR Take 20 mg by mouth daily.   ondansetron 4 MG tablet Commonly known as: ZOFRAN Take 4 mg by mouth.   ondansetron 8 MG disintegrating tablet Commonly known as: Zofran ODT 53m ODT q4 hours prn nausea   oxyCODONE-acetaminophen 5-325 MG tablet Commonly known as: Percocet Take 1-2 tablets by mouth every 6 (six) hours as needed.   pantoprazole 40 MG tablet Commonly known as: PROTONIX Take 40 mg by mouth 2 (two) times daily.   Potassium Chloride ER 20 MEQ Tbcr TAKE 1 TABLET BY MOUTH ONCE DAILY WITH FOOD FOR 30 DAYS   predniSONE 10 MG tablet Commonly known as: DELTASONE Take 10 mg by mouth daily.   triamcinolone cream 0.1 % Commonly known as: KENALOG Apply 1 application topically as needed (for rash).   vitamin B-12 1000 MCG tablet Commonly known as: CYANOCOBALAMIN Take 1,000 mcg  by mouth daily.   vitamin C 1000 MG tablet Take 1,000 mg by mouth daily.   Vitamin D3 25 MCG (1000 UT) Caps Take 2 capsules by mouth daily.       Allergies:  Allergies  Allergen Reactions  . Oxycodone Nausea And Vomiting    Past Medical History, Surgical history, Social history, and Family History were reviewed and updated.  Review of Systems: All other 10 point review of systems is negative.   Physical Exam:  vitals were not taken for this visit.   Wt Readings from Last 3 Encounters:  10/05/19 235 lb 1.3 oz (106.6 kg)  09/07/19 232 lb 0.6 oz (105.3 kg)  08/10/19 225 lb 12.8 oz (102.4  kg)    Ocular: Sclerae unicteric, pupils equal, round and reactive to light Ear-nose-throat: Oropharynx clear, dentition fair Lymphatic: No cervical or supraclavicular adenopathy Lungs no rales or rhonchi, good excursion bilaterally Heart regular rate and rhythm, no murmur appreciated Abd soft, nontender, positive bowel sounds, no liver or spleen tip palpated on exam, no fluid wave  MSK no focal spinal tenderness, no joint edema Neuro: non-focal, well-oriented, appropriate affect Breasts: Deferred   Lab Results  Component Value Date   WBC 6.0 11/15/2019   HGB 10.4 (L) 11/15/2019   HCT 32.1 (L) 11/15/2019   MCV 108.1 (H) 11/15/2019   PLT 154 11/15/2019   Lab Results  Component Value Date   FERRITIN 828 (H) 10/05/2019   IRON 97 10/05/2019   TIBC 253 10/05/2019   UIBC 155 10/05/2019   IRONPCTSAT 38 10/05/2019   Lab Results  Component Value Date   RETICCTPCT 2.3 11/15/2019   RBC 2.98 (L) 11/15/2019   RBC 2.97 (L) 11/15/2019   RETICCTABS 41.0 06/21/2015   No results found for: KPAFRELGTCHN, LAMBDASER, KAPLAMBRATIO No results found for: Kandis Cocking, IGMSERUM No results found for: Odetta Pink, SPEI   Chemistry      Component Value Date/Time   NA 143 10/26/2019 1053   NA 144 04/29/2017 0929   K 4.0 10/26/2019 1053   K 4.0 04/29/2017 0929   CL 108 10/26/2019 1053   CL 103 06/21/2015 0800   CO2 29 10/26/2019 1053   CO2 25 04/29/2017 0929   BUN 20 10/26/2019 1053   BUN 17.7 04/29/2017 0929   CREATININE 1.14 (H) 10/26/2019 1053   CREATININE 0.8 04/29/2017 0929      Component Value Date/Time   CALCIUM 9.9 10/26/2019 1053   CALCIUM 9.3 04/29/2017 0929   ALKPHOS 60 10/26/2019 1053   ALKPHOS 55 04/29/2017 0929   AST 21 10/26/2019 1053   AST 19 04/29/2017 0929   ALT 16 10/26/2019 1053   ALT 16 04/29/2017 0929   BILITOT 0.5 10/26/2019 1053   BILITOT 0.43 04/29/2017 0929       Impression and Plan: Ms.  Helen Hardy is a very pleasant 69yo African American female with 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 She also has intermittent iron deficiency anemia and sickle cell trait. Dur to her elevated blood pressure we were unable to give Retacrit today.  I have routed today's note with vitals and lab work to her PCP.  She will start checking her BP daily and contact her PCP if this remains elevated.  Iron studies are pending.  We will see her back for lab and injection appointment in 3 weeks and follow-up in 6 weeks.  She will contact our office with any questions or concerns. We can  certainly see her sooner if needed.   Laverna Peace, NP 5/12/202110:09 AM

## 2019-11-15 NOTE — Telephone Encounter (Signed)
Appointments scheduled and I LMVM for her w/ dates/times.  I asked that she call back if she needed to change any of theses dates/times 5/12 los

## 2019-11-16 ENCOUNTER — Ambulatory Visit: Payer: Medicare Other | Admitting: Family

## 2019-11-16 ENCOUNTER — Ambulatory Visit: Payer: Medicare Other

## 2019-11-16 ENCOUNTER — Other Ambulatory Visit: Payer: Medicare Other

## 2019-11-17 DIAGNOSIS — M48061 Spinal stenosis, lumbar region without neurogenic claudication: Secondary | ICD-10-CM | POA: Diagnosis not present

## 2019-11-17 DIAGNOSIS — G894 Chronic pain syndrome: Secondary | ICD-10-CM | POA: Diagnosis not present

## 2019-11-29 DIAGNOSIS — S42124A Nondisplaced fracture of acromial process, right shoulder, initial encounter for closed fracture: Secondary | ICD-10-CM | POA: Diagnosis not present

## 2019-11-29 DIAGNOSIS — M19111 Post-traumatic osteoarthritis, right shoulder: Secondary | ICD-10-CM | POA: Diagnosis not present

## 2019-11-29 DIAGNOSIS — M25511 Pain in right shoulder: Secondary | ICD-10-CM | POA: Diagnosis not present

## 2019-12-01 DIAGNOSIS — M5416 Radiculopathy, lumbar region: Secondary | ICD-10-CM | POA: Diagnosis not present

## 2019-12-01 DIAGNOSIS — M5116 Intervertebral disc disorders with radiculopathy, lumbar region: Secondary | ICD-10-CM | POA: Diagnosis not present

## 2019-12-01 DIAGNOSIS — M5136 Other intervertebral disc degeneration, lumbar region: Secondary | ICD-10-CM | POA: Diagnosis not present

## 2019-12-06 ENCOUNTER — Other Ambulatory Visit: Payer: Self-pay

## 2019-12-06 ENCOUNTER — Inpatient Hospital Stay: Payer: Medicare Other

## 2019-12-06 ENCOUNTER — Inpatient Hospital Stay: Payer: Medicare Other | Attending: Hematology & Oncology

## 2019-12-06 VITALS — BP 161/82 | HR 79 | Temp 97.3°F | Resp 19

## 2019-12-06 DIAGNOSIS — D500 Iron deficiency anemia secondary to blood loss (chronic): Secondary | ICD-10-CM

## 2019-12-06 DIAGNOSIS — I129 Hypertensive chronic kidney disease with stage 1 through stage 4 chronic kidney disease, or unspecified chronic kidney disease: Secondary | ICD-10-CM | POA: Insufficient documentation

## 2019-12-06 DIAGNOSIS — N183 Chronic kidney disease, stage 3 unspecified: Secondary | ICD-10-CM

## 2019-12-06 DIAGNOSIS — D631 Anemia in chronic kidney disease: Secondary | ICD-10-CM | POA: Diagnosis not present

## 2019-12-06 DIAGNOSIS — K909 Intestinal malabsorption, unspecified: Secondary | ICD-10-CM

## 2019-12-06 DIAGNOSIS — Z79899 Other long term (current) drug therapy: Secondary | ICD-10-CM | POA: Diagnosis not present

## 2019-12-06 DIAGNOSIS — N189 Chronic kidney disease, unspecified: Secondary | ICD-10-CM | POA: Diagnosis not present

## 2019-12-06 LAB — CMP (CANCER CENTER ONLY)
ALT: 16 U/L (ref 0–44)
AST: 18 U/L (ref 15–41)
Albumin: 4 g/dL (ref 3.5–5.0)
Alkaline Phosphatase: 52 U/L (ref 38–126)
Anion gap: 8 (ref 5–15)
BUN: 19 mg/dL (ref 8–23)
CO2: 26 mmol/L (ref 22–32)
Calcium: 9.7 mg/dL (ref 8.9–10.3)
Chloride: 107 mmol/L (ref 98–111)
Creatinine: 1.03 mg/dL — ABNORMAL HIGH (ref 0.44–1.00)
GFR, Est AFR Am: >60 mL/min (ref >60)
GFR, Estimated: 55 mL/min — ABNORMAL LOW (ref >60)
Glucose, Bld: 121 mg/dL — ABNORMAL HIGH (ref 70–99)
Potassium: 4.2 mmol/L (ref 3.5–5.1)
Sodium: 141 mmol/L (ref 135–145)
Total Bilirubin: 0.4 mg/dL (ref 0.3–1.2)
Total Protein: 6.4 g/dL — ABNORMAL LOW (ref 6.5–8.1)

## 2019-12-06 LAB — RETICULOCYTES
Immature Retic Fract: 30.7 % — ABNORMAL HIGH (ref 2.3–15.9)
RBC.: 3.1 MIL/uL — ABNORMAL LOW (ref 3.87–5.11)
Retic Count, Absolute: 72.2 10*3/uL (ref 19.0–186.0)
Retic Ct Pct: 2.3 % (ref 0.4–3.1)

## 2019-12-06 LAB — CBC WITH DIFFERENTIAL (CANCER CENTER ONLY)
Abs Immature Granulocytes: 0.06 10*3/uL (ref 0.00–0.07)
Basophils Absolute: 0 10*3/uL (ref 0.0–0.1)
Basophils Relative: 0 %
Eosinophils Absolute: 0 10*3/uL (ref 0.0–0.5)
Eosinophils Relative: 0 %
HCT: 33.1 % — ABNORMAL LOW (ref 36.0–46.0)
Hemoglobin: 10.8 g/dL — ABNORMAL LOW (ref 12.0–15.0)
Immature Granulocytes: 1 %
Lymphocytes Relative: 22 %
Lymphs Abs: 1.4 10*3/uL (ref 0.7–4.0)
MCH: 35.2 pg — ABNORMAL HIGH (ref 26.0–34.0)
MCHC: 32.6 g/dL (ref 30.0–36.0)
MCV: 107.8 fL — ABNORMAL HIGH (ref 80.0–100.0)
Monocytes Absolute: 0.5 10*3/uL (ref 0.1–1.0)
Monocytes Relative: 7 %
Neutro Abs: 4.7 10*3/uL (ref 1.7–7.7)
Neutrophils Relative %: 70 %
Platelet Count: 167 10*3/uL (ref 150–400)
RBC: 3.07 MIL/uL — ABNORMAL LOW (ref 3.87–5.11)
RDW: 16.5 % — ABNORMAL HIGH (ref 11.5–15.5)
WBC Count: 6.7 10*3/uL (ref 4.0–10.5)
nRBC: 0 % (ref 0.0–0.2)

## 2019-12-06 MED ORDER — EPOETIN ALFA-EPBX 40000 UNIT/ML IJ SOLN
40000.0000 [IU] | Freq: Once | INTRAMUSCULAR | Status: AC
  Administered 2019-12-06: 40000 [IU] via SUBCUTANEOUS

## 2019-12-06 MED ORDER — EPOETIN ALFA-EPBX 40000 UNIT/ML IJ SOLN
INTRAMUSCULAR | Status: AC
  Filled 2019-12-06: qty 1

## 2019-12-06 NOTE — Patient Instructions (Signed)

## 2019-12-07 DIAGNOSIS — M5116 Intervertebral disc disorders with radiculopathy, lumbar region: Secondary | ICD-10-CM | POA: Diagnosis not present

## 2019-12-07 DIAGNOSIS — M5416 Radiculopathy, lumbar region: Secondary | ICD-10-CM | POA: Diagnosis not present

## 2019-12-07 DIAGNOSIS — M961 Postlaminectomy syndrome, not elsewhere classified: Secondary | ICD-10-CM | POA: Diagnosis not present

## 2019-12-07 DIAGNOSIS — G894 Chronic pain syndrome: Secondary | ICD-10-CM | POA: Diagnosis not present

## 2019-12-07 LAB — FERRITIN: Ferritin: 722 ng/mL — ABNORMAL HIGH (ref 11–307)

## 2019-12-07 LAB — IRON AND TIBC
Iron: 81 ug/dL (ref 41–142)
Saturation Ratios: 28 % (ref 21–57)
TIBC: 288 ug/dL (ref 236–444)
UIBC: 207 ug/dL (ref 120–384)

## 2019-12-09 ENCOUNTER — Emergency Department (HOSPITAL_COMMUNITY)
Admission: EM | Admit: 2019-12-09 | Discharge: 2019-12-09 | Disposition: A | Discharge status: Home / Self Care | Payer: Medicare Other | Attending: Emergency Medicine | Admitting: Emergency Medicine

## 2019-12-09 ENCOUNTER — Other Ambulatory Visit: Payer: Self-pay

## 2019-12-09 DIAGNOSIS — Z79891 Long term (current) use of opiate analgesic: Secondary | ICD-10-CM | POA: Diagnosis not present

## 2019-12-09 DIAGNOSIS — M79604 Pain in right leg: Secondary | ICD-10-CM | POA: Diagnosis not present

## 2019-12-09 DIAGNOSIS — Z96653 Presence of artificial knee joint, bilateral: Secondary | ICD-10-CM | POA: Insufficient documentation

## 2019-12-09 DIAGNOSIS — M5410 Radiculopathy, site unspecified: Secondary | ICD-10-CM | POA: Diagnosis not present

## 2019-12-09 DIAGNOSIS — Z8572 Personal history of non-Hodgkin lymphomas: Secondary | ICD-10-CM | POA: Diagnosis not present

## 2019-12-09 DIAGNOSIS — I129 Hypertensive chronic kidney disease with stage 1 through stage 4 chronic kidney disease, or unspecified chronic kidney disease: Secondary | ICD-10-CM | POA: Diagnosis not present

## 2019-12-09 DIAGNOSIS — N183 Chronic kidney disease, stage 3 unspecified: Secondary | ICD-10-CM | POA: Diagnosis not present

## 2019-12-09 DIAGNOSIS — Z791 Long term (current) use of non-steroidal anti-inflammatories (NSAID): Secondary | ICD-10-CM | POA: Diagnosis not present

## 2019-12-09 DIAGNOSIS — Z79899 Other long term (current) drug therapy: Secondary | ICD-10-CM | POA: Diagnosis not present

## 2019-12-09 DIAGNOSIS — M069 Rheumatoid arthritis, unspecified: Secondary | ICD-10-CM | POA: Diagnosis not present

## 2019-12-09 DIAGNOSIS — Z7952 Long term (current) use of systemic steroids: Secondary | ICD-10-CM | POA: Diagnosis not present

## 2019-12-09 MED ORDER — MORPHINE SULFATE (PF) 4 MG/ML IV SOLN
4.0000 mg | Freq: Once | INTRAVENOUS | Status: AC
  Administered 2019-12-09: 4 mg via INTRAMUSCULAR
  Filled 2019-12-09: qty 1

## 2019-12-09 NOTE — ED Triage Notes (Signed)
Patient reports she had an infusion in her right knee last week . States she has issues with that knee and leg due to RA. She took oxycodone this morning but the pain will not subside. Patient currently has right leg in immobilizer.

## 2019-12-09 NOTE — Discharge Instructions (Addendum)
Today you received medications that may make you sleepy or impair your ability to make decisions.  For the next 24 hours please do not drive, operate heavy machinery, care for a small child with out another adult present, or perform any activities that may cause harm to you or someone else if you were to fall asleep or be impaired.   If you develop fevers, worsening pain, changes to bowel or bladder function, new weakness, or have other concerns please seek additional medical care and evaluation.   You take a lot of medications that may make you sleepy.  You are a high risk of falls.  Please be extra careful, use your walker for balance and take time with any position changes.

## 2019-12-09 NOTE — ED Provider Notes (Signed)
[facility] DEPT Provider Note   CSN: [id number] Arrival date & time: 12/09/19  1301     History Chief Complaint  Patient presents with  . Knee Pain    right    Neima Collie [name] is a 69 y.o. female with a past medical history of non-Hodgkin's lymphoma, chronic anemia, rheumatoid arthritis, obesity, who presents today for evaluation pain in her right leg.  She reports that 7 days ago she had a lumbar injection for her pain.   She denies any known fevers.  No nausea or vomiting.  She reports she has been taking her home pain medicines without relief of pain.  Chart review shows she has a current prescription for oxycodone 10.  She states that previously when she has been like this she requires a shot of morphine and then can go home.  No changes to bowel or bladder function.  No saddle anesthesias or paresthesias.  Her gait is at her baseline.  Her right leg does not hurt more than anyone specific area.  No abnormal leg or joint swelling.  No recent trauma.  HPI     Past Medical History:  Diagnosis Date  . Anemia   . Anemia of chronic renal failure, stage 3 (moderate) 12/30/2018  . Arthritis    Rheumatoid  . Cancer ([place])    Non-Hodgkins Lymphoma  . GERD (gastroesophageal reflux disease)   . Hypertension   . 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 Numbness and tingling of right leg   . Pneumonia     Patient Active Problem List   Diagnosis Date Noted  . Anemia of chronic renal failure, stage 3 (moderate) 12/30/2018  . 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 Iron deficiency anemia 06/21/2015  . 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 Morbid obesity ([place]) 04/10/2015  . 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 S/P knee replacement 04/08/2015    Past Surgical History:  Procedure Laterality Date  . 2012 Right big toe and toe beside - had pins in toes    . ABDOMINAL HYSTERECTOMY    . bladder tack - 2005    . EYE SURGERY Bilateral    cataract  . JOINT  REPLACEMENT     both knee replacement  . LUMBAR LAMINECTOMY/DECOMPRESSION MICRODISCECTOMY N/A 03/04/2016   Procedure: RIGHT FAR LATERAL DISCECTOMY L5-S1;  Surgeon: Melina Schools, MD;  Location: [facility];  Service: Orthopedics;  Laterality: N/A;  . ROTATOR CUFF REPAIR Left   . TOTAL KNEE REVISION Left 04/08/2015   Procedure: REVISION LEFT TOTAL KNEE ;  Surgeon: Paralee Cancel, MD;  Location: [facility];  Service: Orthopedics;  Laterality: Left;     OB History   No obstetric history on file.     No family history on file.  Social History   Tobacco Use  . Smoking status: Never Smoker  . Smokeless tobacco: Never Used  Substance Use Topics  . Alcohol use: No    Alcohol/week: 0.0 standard drinks  . Drug use: No    Home Medications Prior to Admission medications   Medication Sig Start Date End Date Taking? Authorizing Provider  Ascorbic Acid (VITAMIN C) 1000 MG tablet Take 1,000 mg by mouth daily.    [provider]  Cholecalciferol (VITAMIN D3) 1000 units CAPS Take 2 capsules by mouth daily.     [provider]  estradiol (ESTRACE) 1 MG tablet Take 1 mg by mouth daily.  12/25/12   [provider]  folic acid (FOLVITE) 1 MG tablet  Take 2 mg by mouth daily.     [provider]  furosemide (LASIX) 40 MG tablet TAKE 1 TABLET BY MOUTH ONCE DAILY FOR 30 DAYS 12/01/18   [provider]  gabapentin (NEURONTIN) 300 MG capsule Take 300 mg by mouth 3 (three) times daily. 03/17/18   [provider]  HYDROcodone-acetaminophen (NORCO) 10-325 MG tablet Take 1 tablet by mouth 3 (three) times daily as needed. 12/16/18   [provider]  leflunomide (ARAVA) 20 MG tablet Take 20 mg by mouth daily.    [provider]  methocarbamol (ROBAXIN) 500 MG tablet Take 750 mg by mouth 3 (three) times daily. 03/11/18   [provider]  Multiple Vitamin (MULTIVITAMIN) tablet Take 1 tablet by mouth daily.    [provider]  olmesartan  (BENICAR) 20 MG tablet Take 20 mg by mouth daily. 12/27/18   [provider]  ondansetron (ZOFRAN ODT) 8 MG disintegrating tablet 28m ODT q4 hours prn nausea 06/15/18   DVeryl Speak MD  ondansetron (ZOFRAN) 4 MG tablet Take 4 mg by mouth. 04/10/19   [provider]  oxyCODONE-acetaminophen (PERCOCET) 5-325 MG tablet Take 1-2 tablets by mouth every 6 (six) hours as needed. 06/15/18   DVeryl Speak MD  pantoprazole (PROTONIX) 40 MG tablet Take 40 mg by mouth 2 (two) times daily.     [provider]  Potassium Chloride ER 20 MEQ TBCR TAKE 1 TABLET BY MOUTH ONCE DAILY WITH FOOD FOR 30 DAYS 12/22/18   [provider]  predniSONE (DELTASONE) 10 MG tablet Take 10 mg by mouth daily. 04/12/18   [provider]  triamcinolone cream (KENALOG) 0.1 % Apply 1 application topically as needed (for rash).     [provider]  vitamin B-12 (CYANOCOBALAMIN) 1000 MCG tablet Take 1,000 mcg by mouth daily.    [provider]    Allergies    Tramadol and Oxycodone  Review of Systems   Review of Systems  Constitutional: Negative for chills and fever.  Musculoskeletal: Positive for back pain. Negative for neck pain.       Pain diffusely in right leg.   Skin: Negative for color change and wound.  All other systems reviewed and are negative.   Physical Exam Updated Vital Signs BP 135/80 (BP Location: Left Arm)   Pulse 83   Temp 98.7 F (37.1 C) (Oral)   Resp 16   Ht 5' 4"  (1.626 m)   Wt 106.6 kg   SpO2 99%   BMI 40.34 kg/m   Physical Exam Vitals and nursing note reviewed.  Constitutional:      General: She is not in acute distress.    Appearance: She is well-developed. She is obese. She is not diaphoretic.  HENT:     Head: Normocephalic and atraumatic.  Eyes:     General: No scleral icterus.       Right eye: No discharge.        Left eye: No discharge.     Conjunctiva/sclera: Conjunctivae normal.  Cardiovascular:     Rate and Rhythm:  Normal rate.  Pulmonary:     Effort: Pulmonary effort is normal. No respiratory distress.     Breath sounds: No stridor.  Abdominal:     General: There is no distension.     Tenderness: There is no abdominal tenderness. There is no guarding.  Musculoskeletal:        General: No deformity.     Cervical back: Normal range of motion.  Right lower leg: No edema.     Left lower leg: No edema.     Comments: No localized tenderness palpation, pain, crepitus or deformity of right leg, lumbar back.  Palpation of the right-sided buttock recreates and exacerbates her reported pain and radiation.  Skin:    General: Skin is warm and dry.     Comments: Well-healed surgical scar across right knee  Neurological:     Mental Status: She is alert.     Sensory: No sensory deficit (Sensation intact to light touch to bilateral legs.).     Motor: No abnormal muscle tone.  Psychiatric:        Behavior: Behavior normal.     ED Results / Procedures / Treatments   Labs (all labs ordered are listed, but only abnormal results are displayed) Labs Reviewed - No data to display  EKG None  Radiology No results found.  Procedures Procedures (including critical care time)  Medications Ordered in ED Medications  morphine 4 MG/ML injection 4 mg (4 mg Intramuscular Given 12/09/19 1457)    ED Course  I have reviewed the triage vital signs and the nursing notes.  Pertinent labs & imaging results that were available during my care of the patient were reviewed by me and considered in my medical decision making (see chart for details).  Clinical Course as of Dec 09 1839  Sat Dec 09, 9362  1312 69 year old female with history of rheumatoid arthritis chronic steroid use and chronic back and radicular pain down her right leg.  She had a recent injection by Dr. Nelva Bush and she said it worked for a few days but then the pain came back.  They have also increased her gabapentin and her narcotic pain medicine but she  still in severe pain.  Radiates down her leg to her foot.  No new trauma.  No fevers.  We will try some pain medication here.   [MB]  7824 Patient re-evaluated, pain improved, wishes for discharge home at this time.    [EH]    Clinical Course User Index [EH] Lorin Glass, PA-C [MB] Hayden Rasmussen, MD   MDM Rules/Calculators/A&P                     Patient is a 68 year old woman with a past medical history of chronic back and right leg radicular pain who presents today for evaluation of pain in her right leg.  She feels like the pain is primarily coming from her back.  This is consistent with her usual chronic pain however she states it is worse than usual.  She did recently have an injection which improved her pain for a few days.  She denies any fevers, no saddle anesthesias paresthesias or changes to bowel or bladder function.  Clinically she is well-appearing, afebrile not tachycardic or tachypneic.  Do not suspect infectious etiology.  She has not had any trauma or falls, no indication for imaging at this time.  She is given a dose of morphine IM, which she states is usually what she needs, after this she was reevaluated, reports that her pain had improved and she wished for discharge home.  I counseled both the patient, and her husband on increased fall risk as patient is on multiple sedating medications.  We discussed risks of polypharmacy and overdose along with the possible significant consequences of a fall.  Patient is given information on fall prevention, states her understanding.  This patient was seen as a  shared visit with Dr. Melina Copa.   Return precautions were discussed with patient who states their understanding.  At the time of discharge patient denied any unaddressed complaints or concerns.  Patient is agreeable for discharge home.  Note: Portions of this report may have been transcribed using voice recognition software. Every effort was made to ensure accuracy;  however, inadvertent computerized transcription errors may be present.   Final Clinical Impression(s) / ED Diagnoses Final diagnoses:  Radicular leg pain    Rx / DC Orders ED Discharge Orders    None       Ollen Gross 12/09/19 1843    Hayden Rasmussen, MD 12/10/19 [id number]

## 2019-12-10 ENCOUNTER — Other Ambulatory Visit: Payer: Self-pay

## 2019-12-10 ENCOUNTER — Encounter (HOSPITAL_COMMUNITY): Payer: Self-pay | Admitting: Emergency Medicine

## 2019-12-10 ENCOUNTER — Emergency Department (HOSPITAL_COMMUNITY): Payer: Medicare Other

## 2019-12-10 ENCOUNTER — Emergency Department (HOSPITAL_COMMUNITY)
Admission: EM | Admit: 2019-12-10 | Discharge: 2019-12-10 | Disposition: A | Discharge status: Home / Self Care | Payer: Medicare Other | Attending: Emergency Medicine | Admitting: Emergency Medicine

## 2019-12-10 DIAGNOSIS — N183 Chronic kidney disease, stage 3 unspecified: Secondary | ICD-10-CM | POA: Diagnosis not present

## 2019-12-10 DIAGNOSIS — M5416 Radiculopathy, lumbar region: Secondary | ICD-10-CM | POA: Diagnosis not present

## 2019-12-10 DIAGNOSIS — Z8572 Personal history of non-Hodgkin lymphomas: Secondary | ICD-10-CM | POA: Insufficient documentation

## 2019-12-10 DIAGNOSIS — E669 Obesity, unspecified: Secondary | ICD-10-CM | POA: Insufficient documentation

## 2019-12-10 DIAGNOSIS — I129 Hypertensive chronic kidney disease with stage 1 through stage 4 chronic kidney disease, or unspecified chronic kidney disease: Secondary | ICD-10-CM | POA: Diagnosis not present

## 2019-12-10 DIAGNOSIS — M069 Rheumatoid arthritis, unspecified: Secondary | ICD-10-CM | POA: Insufficient documentation

## 2019-12-10 DIAGNOSIS — Z79899 Other long term (current) drug therapy: Secondary | ICD-10-CM | POA: Insufficient documentation

## 2019-12-10 DIAGNOSIS — Z6832 Body mass index (BMI) 32.0-32.9, adult: Secondary | ICD-10-CM | POA: Diagnosis not present

## 2019-12-10 DIAGNOSIS — M5410 Radiculopathy, site unspecified: Secondary | ICD-10-CM | POA: Diagnosis not present

## 2019-12-10 DIAGNOSIS — M25551 Pain in right hip: Secondary | ICD-10-CM | POA: Diagnosis not present

## 2019-12-10 MED ORDER — HYDROMORPHONE HCL 1 MG/ML IJ SOLN
1.0000 mg | Freq: Once | INTRAMUSCULAR | Status: AC
  Administered 2019-12-10: 1 mg via INTRAMUSCULAR
  Filled 2019-12-10: qty 1

## 2019-12-10 NOTE — ED Provider Notes (Signed)
[facility] EMERGENCY DEPARTMENT Provider Note   CSN: [id number] Arrival date & time: 12/10/19  1402     History Chief Complaint  Patient presents with  . Hip Pain  . Leg Pain    Helen Hardy is a 69 y.o. female.  The history is provided by the patient and medical records. No language interpreter was used.  Hip Pain  Leg Pain    69 year old female with history of RA, chronic steroid use, chronic back pain and radicular pain who recently had spinal injection by Dr. Nelva Bush presenting complaining of back pain.  Patient states she had an MRI of her low back approximately a week ago and was told that she has some disc disease.  She was subsequently received a steroid shot to her spine.  Since then she endorsed progressive worsening pain to her right hip right leg.  Pain is described as a burning shooting sensation, worse with standing and walking, moderate to severe, causing her difficulty ambulating.  No associated fever chills no bowel bladder incontinence no signs of lesion, no rash.  She denies any recent injury.  She was seen by her specialist PA several days prior for her pain, and was recommended to increase her gabapentin as well as her oxycodone.  She has been compliant with the medication but states it did not provide adequate relief.  She was seen earlier today for her pain, and received dose of pain medication.  Provide minimal relief and now she is back again with complaints of pain.  Patient states that she has been on pain medication for a prolonged time.  Her tolerance is high.  She needs additional pain management.  She is able to ambulate with some difficulty.  She is scheduled to have her main treatment for RA tomorrow.  She worries she may not be able to make it to the appointment due to her pain.  Past Medical History:  Diagnosis Date  . Anemia   . Anemia of chronic renal failure, stage 3 (moderate) 12/30/2018  . Arthritis    Rheumatoid  . Cancer  ([place])    Non-Hodgkins Lymphoma  . GERD (gastroesophageal reflux disease)   . Hypertension   . 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 Numbness and tingling of right leg   . Pneumonia     Patient Active Problem List   Diagnosis Date Noted  . Anemia of chronic renal failure, stage 3 (moderate) 12/30/2018  . 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 Iron deficiency anemia 06/21/2015  . 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 Morbid obesity ([place]) 04/10/2015  . 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 S/P knee replacement 04/08/2015    Past Surgical History:  Procedure Laterality Date  . 2012 Right big toe and toe beside - had pins in toes    . ABDOMINAL HYSTERECTOMY    . bladder tack - 2005    . EYE SURGERY Bilateral    cataract  . JOINT REPLACEMENT     both knee replacement  . LUMBAR LAMINECTOMY/DECOMPRESSION MICRODISCECTOMY N/A 03/04/2016   Procedure: RIGHT FAR LATERAL DISCECTOMY L5-S1;  Surgeon: Melina Schools, MD;  Location: [facility];  Service: Orthopedics;  Laterality: N/A;  . ROTATOR CUFF REPAIR Left   . TOTAL KNEE REVISION Left 04/08/2015   Procedure: REVISION LEFT TOTAL KNEE ;  Surgeon: Paralee Cancel, MD;  Location: [facility];  Service: Orthopedics;  Laterality: Left;     OB History   No obstetric history on file.  No family history on file.  Social History   Tobacco Use  . Smoking status: Never Smoker  . Smokeless tobacco: Never Used  Substance Use Topics  . Alcohol use: No    Alcohol/week: 0.0 standard drinks  . Drug use: No    Home Medications Prior to Admission medications   Medication Sig Start Date End Date Taking? Authorizing Provider  Ascorbic Acid (VITAMIN C) 1000 MG tablet Take 1,000 mg by mouth daily.    [provider]  Cholecalciferol (VITAMIN D3) 1000 units CAPS Take 2 capsules by mouth daily.     [provider]  estradiol (ESTRACE) 1 MG tablet Take 1 mg by mouth daily.  12/25/12   [provider]  folic acid (FOLVITE) 1 MG tablet Take 2  mg by mouth daily.     [provider]  furosemide (LASIX) 40 MG tablet TAKE 1 TABLET BY MOUTH ONCE DAILY FOR 30 DAYS 12/01/18   [provider]  gabapentin (NEURONTIN) 300 MG capsule Take 300 mg by mouth 3 (three) times daily. 03/17/18   [provider]  HYDROcodone-acetaminophen (NORCO) 10-325 MG tablet Take 1 tablet by mouth 3 (three) times daily as needed. 12/16/18   [provider]  leflunomide (ARAVA) 20 MG tablet Take 20 mg by mouth daily.    [provider]  methocarbamol (ROBAXIN) 500 MG tablet Take 750 mg by mouth 3 (three) times daily. 03/11/18   [provider]  Multiple Vitamin (MULTIVITAMIN) tablet Take 1 tablet by mouth daily.    [provider]  olmesartan (BENICAR) 20 MG tablet Take 20 mg by mouth daily. 12/27/18   [provider]  ondansetron (ZOFRAN ODT) 8 MG disintegrating tablet 18m ODT q4 hours prn nausea 06/15/18   DVeryl Speak MD  ondansetron (ZOFRAN) 4 MG tablet Take 4 mg by mouth. 04/10/19   [provider]  oxyCODONE-acetaminophen (PERCOCET) 5-325 MG tablet Take 1-2 tablets by mouth every 6 (six) hours as needed. 06/15/18   DVeryl Speak MD  pantoprazole (PROTONIX) 40 MG tablet Take 40 mg by mouth 2 (two) times daily.     [provider]  Potassium Chloride ER 20 MEQ TBCR TAKE 1 TABLET BY MOUTH ONCE DAILY WITH FOOD FOR 30 DAYS 12/22/18   [provider]  predniSONE (DELTASONE) 10 MG tablet Take 10 mg by mouth daily. 04/12/18   [provider]  triamcinolone cream (KENALOG) 0.1 % Apply 1 application topically as needed (for rash).     [provider]  vitamin B-12 (CYANOCOBALAMIN) 1000 MCG tablet Take 1,000 mcg by mouth daily.    [provider]    Allergies    Tramadol and Oxycodone  Review of Systems   Review of Systems  All other systems reviewed and are negative.   Physical Exam Updated Vital Signs BP 118/86 (BP Location: Right Arm)   Pulse  83   Temp 98.5 F (36.9 C) (Oral)   Resp 17   Ht 5' 11"  (1.803 m)   Wt 105.7 kg   SpO2 100%   BMI 32.50 kg/m   Physical Exam Vitals and nursing note reviewed.  Constitutional:      General: She is not in acute distress.    Appearance: She is well-developed. She is obese.  HENT:     Head: Atraumatic.  Eyes:     Conjunctiva/sclera: Conjunctivae normal.  Abdominal:     Palpations: Abdomen is soft.     Tenderness: There is no abdominal tenderness.  Musculoskeletal:  General: Tenderness (Tenderness to right lumbosacral region.  Tenderness to right inguinal region with decreased hip range of motion secondary to pain but no deformity and no leg shortening) present.     Cervical back: Neck supple.     Comments: No significant midline spine tenderness.  Skin:    Capillary Refill: Capillary refill takes less than 2 seconds.     Findings: No rash.     Comments: Patient is wearing a knee sleeve  Neurological:     Mental Status: She is alert.     ED Results / Procedures / Treatments   Labs (all labs ordered are listed, but only abnormal results are displayed) Labs Reviewed - No data to display  EKG None  Radiology CT Hip Right Wo Contrast  Result Date: 12/10/2019 CLINICAL DATA:  Right hip pain. EXAM: CT OF THE RIGHT HIP WITHOUT CONTRAST TECHNIQUE: Multidetector CT imaging of the right hip was performed according to the standard protocol. Multiplanar CT image reconstructions were also generated. COMPARISON:  CT scan 08/17/2017 FINDINGS: Bones/Joint/Cartilage No appreciable fracture or acute bony finding. Mild spurring of the right femoral head. Mild spurring at the pubis. Ligaments Suboptimally assessed by CT. Muscles and Tendons Chronic appearing calcification in the proximal hamstring tendon. Soft tissues Sigmoid colon diverticulosis. IMPRESSION: 1. No appreciable fracture or acute bony finding. 2. Mild spurring of the right femoral head and pubis. 3. Chronic appearing  calcification in the proximal hamstring tendon. 4. Sigmoid colon diverticulosis. Electronically Signed   By: Van Clines M.D.   On: 12/10/2019 17:23    Procedures Procedures (including critical care time)  Medications Ordered in ED Medications  HYDROmorphone (DILAUDID) injection 1 mg (1 mg Intramuscular Given 12/10/19 1759)    ED Course  I have reviewed the triage vital signs and the nursing notes.  Pertinent labs & imaging results that were available during my care of the patient were reviewed by me and considered in my medical decision making (see chart for details).    MDM Rules/Calculators/A&P                      BP 118/86 (BP Location: Right Arm)   Pulse 83   Temp 98.5 F (36.9 C) (Oral)   Resp 17   Ht 5' 11"  (1.803 m)   Wt 105.7 kg   SpO2 100%   BMI 32.50 kg/m   Final Clinical Impression(s) / ED Diagnoses Final diagnoses:  Radicular low back pain    Rx / DC Orders ED Discharge Orders    None     4:38 PM Patient here with acute radicular right leg pain.  This time she felt her pain is more in her right inguinal region and does not complain of significant back pain.  She has been seen earlier today for her complaints and received medication.  She reports earlier in the week by her specialist as well as her neurosurgeon she received injection in her lower back without adequate improvement.  At this time I have low suspicion for infectious etiology but in the setting of right hip pain, and will obtain CT scan to rule out malignancy or occult fracture.  Pain medication given.  Care discussed with Dr. Vanita Panda.  6:30 PM Right hip CT scan without any acute bony finding or acute fracture.  There is chronic appearing calcifications of the proximal hamstring tendons.  6:55 PM Patient reports doing much better with pain medication.  She is able to ambulate with a cane.  She is stable for discharge.  She will call and follow-up closely with her neurosurgeon for  further management.  Return precaution discussed.   Domenic Moras, PA-C 12/10/19 1857    Carmin Muskrat, MD 12/10/19 2100

## 2019-12-10 NOTE — ED Notes (Signed)
Pt verbalized understanding of d/c instructions, follow up care and s/s requiring return to ed. Pt had no further questions at this time. Pt transported to exit via wheelchair.

## 2019-12-10 NOTE — ED Notes (Signed)
Transported to CT 

## 2019-12-10 NOTE — Discharge Instructions (Addendum)
Please call and follow up closely with your doctor for further management of your recurrent leg pain.

## 2019-12-10 NOTE — ED Triage Notes (Signed)
C/o R hip and R leg pain. States she had injection on 5/28 that helped initially but now pain is worse.  Pt wearing R knee immobilizer.  States she saw PA on Thursday and put on oxycodone without relief.

## 2019-12-11 DIAGNOSIS — M0579 Rheumatoid arthritis with rheumatoid factor of multiple sites without organ or systems involvement: Secondary | ICD-10-CM | POA: Diagnosis not present

## 2019-12-12 DIAGNOSIS — M5116 Intervertebral disc disorders with radiculopathy, lumbar region: Secondary | ICD-10-CM | POA: Diagnosis not present

## 2019-12-12 DIAGNOSIS — G894 Chronic pain syndrome: Secondary | ICD-10-CM | POA: Diagnosis not present

## 2019-12-12 DIAGNOSIS — M961 Postlaminectomy syndrome, not elsewhere classified: Secondary | ICD-10-CM | POA: Diagnosis not present

## 2019-12-12 DIAGNOSIS — Z79891 Long term (current) use of opiate analgesic: Secondary | ICD-10-CM | POA: Diagnosis not present

## 2019-12-19 DIAGNOSIS — M5416 Radiculopathy, lumbar region: Secondary | ICD-10-CM | POA: Diagnosis not present

## 2019-12-19 DIAGNOSIS — G894 Chronic pain syndrome: Secondary | ICD-10-CM | POA: Diagnosis not present

## 2019-12-25 ENCOUNTER — Inpatient Hospital Stay (HOSPITAL_COMMUNITY)
Admission: EM | Admit: 2019-12-25 | Discharge: 2019-12-31 | DRG: 392 | Disposition: A | Discharge status: Home Health | Payer: Medicare Other | Attending: Internal Medicine | Admitting: Internal Medicine

## 2019-12-25 ENCOUNTER — Other Ambulatory Visit: Payer: Self-pay

## 2019-12-25 ENCOUNTER — Encounter (HOSPITAL_COMMUNITY): Payer: Self-pay | Admitting: Emergency Medicine

## 2019-12-25 ENCOUNTER — Emergency Department (HOSPITAL_COMMUNITY): Payer: Medicare Other

## 2019-12-25 DIAGNOSIS — K625 Hemorrhage of anus and rectum: Secondary | ICD-10-CM | POA: Diagnosis not present

## 2019-12-25 DIAGNOSIS — K219 Gastro-esophageal reflux disease without esophagitis: Secondary | ICD-10-CM | POA: Diagnosis present

## 2019-12-25 DIAGNOSIS — Z885 Allergy status to narcotic agent status: Secondary | ICD-10-CM | POA: Diagnosis not present

## 2019-12-25 DIAGNOSIS — K50011 Crohn's disease of small intestine with rectal bleeding: Secondary | ICD-10-CM

## 2019-12-25 DIAGNOSIS — R531 Weakness: Secondary | ICD-10-CM

## 2019-12-25 DIAGNOSIS — R109 Unspecified abdominal pain: Secondary | ICD-10-CM | POA: Diagnosis present

## 2019-12-25 DIAGNOSIS — Z79890 Hormone replacement therapy (postmenopausal): Secondary | ICD-10-CM

## 2019-12-25 DIAGNOSIS — D7589 Other specified diseases of blood and blood-forming organs: Secondary | ICD-10-CM

## 2019-12-25 DIAGNOSIS — K5732 Diverticulitis of large intestine without perforation or abscess without bleeding: Secondary | ICD-10-CM

## 2019-12-25 DIAGNOSIS — K5909 Other constipation: Secondary | ICD-10-CM | POA: Diagnosis present

## 2019-12-25 DIAGNOSIS — Z7952 Long term (current) use of systemic steroids: Secondary | ICD-10-CM | POA: Diagnosis not present

## 2019-12-25 DIAGNOSIS — Z20822 Contact with and (suspected) exposure to covid-19: Secondary | ICD-10-CM | POA: Diagnosis present

## 2019-12-25 DIAGNOSIS — R933 Abnormal findings on diagnostic imaging of other parts of digestive tract: Secondary | ICD-10-CM | POA: Diagnosis not present

## 2019-12-25 DIAGNOSIS — M170 Bilateral primary osteoarthritis of knee: Secondary | ICD-10-CM | POA: Diagnosis present

## 2019-12-25 DIAGNOSIS — K5752 Diverticulitis of both small and large intestine without perforation or abscess without bleeding: Principal | ICD-10-CM | POA: Diagnosis present

## 2019-12-25 DIAGNOSIS — A040 Enteropathogenic Escherichia coli infection: Secondary | ICD-10-CM | POA: Diagnosis present

## 2019-12-25 DIAGNOSIS — Z79899 Other long term (current) drug therapy: Secondary | ICD-10-CM

## 2019-12-25 DIAGNOSIS — A044 Other intestinal Escherichia coli infections: Secondary | ICD-10-CM | POA: Diagnosis present

## 2019-12-25 DIAGNOSIS — M5416 Radiculopathy, lumbar region: Secondary | ICD-10-CM | POA: Diagnosis not present

## 2019-12-25 DIAGNOSIS — N179 Acute kidney failure, unspecified: Secondary | ICD-10-CM | POA: Diagnosis not present

## 2019-12-25 DIAGNOSIS — K5792 Diverticulitis of intestine, part unspecified, without perforation or abscess without bleeding: Secondary | ICD-10-CM | POA: Diagnosis not present

## 2019-12-25 DIAGNOSIS — D631 Anemia in chronic kidney disease: Secondary | ICD-10-CM | POA: Diagnosis present

## 2019-12-25 DIAGNOSIS — G8929 Other chronic pain: Secondary | ICD-10-CM | POA: Diagnosis present

## 2019-12-25 DIAGNOSIS — R739 Hyperglycemia, unspecified: Secondary | ICD-10-CM | POA: Diagnosis present

## 2019-12-25 DIAGNOSIS — K5000 Crohn's disease of small intestine without complications: Secondary | ICD-10-CM | POA: Diagnosis present

## 2019-12-25 DIAGNOSIS — Z8572 Personal history of non-Hodgkin lymphomas: Secondary | ICD-10-CM | POA: Diagnosis not present

## 2019-12-25 DIAGNOSIS — K5730 Diverticulosis of large intestine without perforation or abscess without bleeding: Secondary | ICD-10-CM | POA: Diagnosis not present

## 2019-12-25 DIAGNOSIS — Z6841 Body Mass Index (BMI) 40.0 and over, adult: Secondary | ICD-10-CM | POA: Diagnosis not present

## 2019-12-25 DIAGNOSIS — I129 Hypertensive chronic kidney disease with stage 1 through stage 4 chronic kidney disease, or unspecified chronic kidney disease: Secondary | ICD-10-CM | POA: Diagnosis present

## 2019-12-25 DIAGNOSIS — K922 Gastrointestinal hemorrhage, unspecified: Secondary | ICD-10-CM | POA: Diagnosis not present

## 2019-12-25 DIAGNOSIS — M069 Rheumatoid arthritis, unspecified: Secondary | ICD-10-CM | POA: Diagnosis present

## 2019-12-25 DIAGNOSIS — N183 Chronic kidney disease, stage 3 unspecified: Secondary | ICD-10-CM | POA: Diagnosis present

## 2019-12-25 DIAGNOSIS — I10 Essential (primary) hypertension: Secondary | ICD-10-CM | POA: Diagnosis not present

## 2019-12-25 DIAGNOSIS — E872 Acidosis: Secondary | ICD-10-CM | POA: Diagnosis present

## 2019-12-25 DIAGNOSIS — Z96653 Presence of artificial knee joint, bilateral: Secondary | ICD-10-CM | POA: Diagnosis present

## 2019-12-25 DIAGNOSIS — A498 Other bacterial infections of unspecified site: Secondary | ICD-10-CM | POA: Diagnosis not present

## 2019-12-25 DIAGNOSIS — Z90710 Acquired absence of both cervix and uterus: Secondary | ICD-10-CM | POA: Diagnosis not present

## 2019-12-25 LAB — SARS CORONAVIRUS 2 BY RT PCR (HOSPITAL ORDER, PERFORMED IN ~~LOC~~ HOSPITAL LAB): SARS Coronavirus 2: NEGATIVE

## 2019-12-25 LAB — CBC WITH DIFFERENTIAL/PLATELET
Abs Immature Granulocytes: 0.07 10*3/uL (ref 0.00–0.07)
Basophils Absolute: 0 10*3/uL (ref 0.0–0.1)
Basophils Relative: 0 %
Eosinophils Absolute: 0 10*3/uL (ref 0.0–0.5)
Eosinophils Relative: 0 %
HCT: 37.8 % (ref 36.0–46.0)
Hemoglobin: 12 g/dL (ref 12.0–15.0)
Immature Granulocytes: 1 %
Lymphocytes Relative: 25 %
Lymphs Abs: 1.9 10*3/uL (ref 0.7–4.0)
MCH: 35.3 pg — ABNORMAL HIGH (ref 26.0–34.0)
MCHC: 31.7 g/dL (ref 30.0–36.0)
MCV: 111.2 fL — ABNORMAL HIGH (ref 80.0–100.0)
Monocytes Absolute: 0.5 10*3/uL (ref 0.1–1.0)
Monocytes Relative: 7 %
Neutro Abs: 5 10*3/uL (ref 1.7–7.7)
Neutrophils Relative %: 67 %
Platelets: 197 10*3/uL (ref 150–400)
RBC: 3.4 MIL/uL — ABNORMAL LOW (ref 3.87–5.11)
RDW: 17 % — ABNORMAL HIGH (ref 11.5–15.5)
WBC: 7.5 10*3/uL (ref 4.0–10.5)
nRBC: 0 % (ref 0.0–0.2)

## 2019-12-25 LAB — COMPREHENSIVE METABOLIC PANEL
ALT: 23 U/L (ref 0–44)
AST: 29 U/L (ref 15–41)
Albumin: 3.7 g/dL (ref 3.5–5.0)
Alkaline Phosphatase: 55 U/L (ref 38–126)
Anion gap: 12 (ref 5–15)
BUN: 27 mg/dL — ABNORMAL HIGH (ref 8–23)
CO2: 27 mmol/L (ref 22–32)
Calcium: 9.1 mg/dL (ref 8.9–10.3)
Chloride: 102 mmol/L (ref 98–111)
Creatinine, Ser: 1.41 mg/dL — ABNORMAL HIGH (ref 0.44–1.00)
GFR calc Af Amer: 44 mL/min — ABNORMAL LOW (ref >60)
GFR calc non Af Amer: 38 mL/min — ABNORMAL LOW (ref >60)
Glucose, Bld: 139 mg/dL — ABNORMAL HIGH (ref 70–99)
Potassium: 4.8 mmol/L (ref 3.5–5.1)
Sodium: 141 mmol/L (ref 135–145)
Total Bilirubin: 0.6 mg/dL (ref 0.3–1.2)
Total Protein: 6.9 g/dL (ref 6.5–8.1)

## 2019-12-25 LAB — TYPE AND SCREEN
ABO/RH(D): O POS
Antibody Screen: NEGATIVE

## 2019-12-25 LAB — LIPASE, BLOOD: Lipase: 19 U/L (ref 11–51)

## 2019-12-25 LAB — POC OCCULT BLOOD, ED: Fecal Occult Bld: POSITIVE — AB

## 2019-12-25 LAB — ABO/RH: ABO/RH(D): O POS

## 2019-12-25 MED ORDER — CIPROFLOXACIN IN D5W 400 MG/200ML IV SOLN
400.0000 mg | Freq: Once | INTRAVENOUS | Status: AC
  Administered 2019-12-25: 400 mg via INTRAVENOUS
  Filled 2019-12-25: qty 200

## 2019-12-25 MED ORDER — SODIUM CHLORIDE (PF) 0.9 % IJ SOLN
INTRAMUSCULAR | Status: AC
  Administered 2019-12-25: 3 mL via INTRAVENOUS
  Filled 2019-12-25: qty 50

## 2019-12-25 MED ORDER — ASCORBIC ACID 500 MG PO TABS
1000.0000 mg | ORAL_TABLET | Freq: Every day | ORAL | Status: DC
  Administered 2019-12-25 – 2019-12-31 (×7): 1000 mg via ORAL
  Filled 2019-12-25 (×7): qty 2

## 2019-12-25 MED ORDER — PANTOPRAZOLE SODIUM 40 MG PO TBEC
40.0000 mg | DELAYED_RELEASE_TABLET | Freq: Every day | ORAL | Status: DC
  Administered 2019-12-25 – 2019-12-31 (×7): 40 mg via ORAL
  Filled 2019-12-25 (×7): qty 1

## 2019-12-25 MED ORDER — SODIUM CHLORIDE 0.9 % IV SOLN: INTRAVENOUS | Status: DC

## 2019-12-25 MED ORDER — GABAPENTIN 300 MG PO CAPS
600.0000 mg | ORAL_CAPSULE | Freq: Every day | ORAL | Status: DC
  Administered 2019-12-25 – 2019-12-30 (×6): 600 mg via ORAL
  Filled 2019-12-25 (×7): qty 2

## 2019-12-25 MED ORDER — ACETAMINOPHEN 325 MG PO TABS
650.0000 mg | ORAL_TABLET | Freq: Four times a day (QID) | ORAL | Status: DC | PRN
  Administered 2019-12-25: 650 mg via ORAL
  Filled 2019-12-25: qty 2

## 2019-12-25 MED ORDER — METHOCARBAMOL 500 MG PO TABS
750.0000 mg | ORAL_TABLET | Freq: Two times a day (BID) | ORAL | Status: DC
  Administered 2019-12-25 – 2019-12-31 (×13): 750 mg via ORAL
  Filled 2019-12-25 (×13): qty 2

## 2019-12-25 MED ORDER — GABAPENTIN 300 MG PO CAPS
300.0000 mg | ORAL_CAPSULE | ORAL | Status: DC
  Administered 2019-12-25 – 2019-12-31 (×13): 300 mg via ORAL
  Filled 2019-12-25 (×11): qty 1

## 2019-12-25 MED ORDER — FOLIC ACID 1 MG PO TABS
2.0000 mg | ORAL_TABLET | Freq: Every day | ORAL | Status: DC
  Administered 2019-12-25 – 2019-12-31 (×7): 2 mg via ORAL
  Filled 2019-12-25 (×7): qty 2

## 2019-12-25 MED ORDER — ACETAMINOPHEN 325 MG PO TABS
650.0000 mg | ORAL_TABLET | Freq: Once | ORAL | Status: AC
  Administered 2019-12-25: 650 mg via ORAL
  Filled 2019-12-25: qty 2

## 2019-12-25 MED ORDER — SODIUM CHLORIDE 0.9 % IV BOLUS
500.0000 mL | Freq: Once | INTRAVENOUS | Status: AC
  Administered 2019-12-25: 500 mL via INTRAVENOUS

## 2019-12-25 MED ORDER — MORPHINE SULFATE (PF) 2 MG/ML IV SOLN
2.0000 mg | INTRAVENOUS | Status: DC | PRN
  Administered 2019-12-25 – 2019-12-29 (×12): 2 mg via INTRAVENOUS
  Filled 2019-12-25 (×13): qty 1

## 2019-12-25 MED ORDER — IOHEXOL 300 MG/ML  SOLN
100.0000 mL | Freq: Once | INTRAMUSCULAR | Status: AC | PRN
  Administered 2019-12-25: 80 mL via INTRAVENOUS

## 2019-12-25 MED ORDER — CIPROFLOXACIN IN D5W 400 MG/200ML IV SOLN
400.0000 mg | Freq: Two times a day (BID) | INTRAVENOUS | Status: DC
  Administered 2019-12-25 – 2019-12-26 (×2): 400 mg via INTRAVENOUS
  Filled 2019-12-25 (×2): qty 200

## 2019-12-25 MED ORDER — LEFLUNOMIDE 20 MG PO TABS
20.0000 mg | ORAL_TABLET | Freq: Every day | ORAL | Status: DC
  Administered 2019-12-25 – 2019-12-31 (×7): 20 mg via ORAL
  Filled 2019-12-25 (×7): qty 1

## 2019-12-25 MED ORDER — VITAMIN B-12 1000 MCG PO TABS
1000.0000 ug | ORAL_TABLET | Freq: Every day | ORAL | Status: DC
  Administered 2019-12-25 – 2019-12-31 (×7): 1000 ug via ORAL
  Filled 2019-12-25 (×7): qty 1

## 2019-12-25 MED ORDER — SODIUM CHLORIDE 0.9 % IV SOLN: INTRAVENOUS | Status: AC

## 2019-12-25 MED ORDER — VITAMIN D3 25 MCG (1000 UNIT) PO TABS
2000.0000 [IU] | ORAL_TABLET | Freq: Every day | ORAL | Status: DC
  Administered 2019-12-25 – 2019-12-31 (×7): 2000 [IU] via ORAL
  Filled 2019-12-25 (×7): qty 2

## 2019-12-25 MED ORDER — HYDROCODONE-ACETAMINOPHEN 5-325 MG PO TABS
1.0000 | ORAL_TABLET | ORAL | Status: DC | PRN
  Administered 2019-12-25 – 2019-12-31 (×13): 1 via ORAL
  Filled 2019-12-25 (×14): qty 1

## 2019-12-25 MED ORDER — ADULT MULTIVITAMIN W/MINERALS CH
1.0000 | ORAL_TABLET | Freq: Every day | ORAL | Status: DC
  Administered 2019-12-25 – 2019-12-31 (×6): 1 via ORAL
  Filled 2019-12-25 (×6): qty 1

## 2019-12-25 MED ORDER — PREDNISONE 5 MG PO TABS
10.0000 mg | ORAL_TABLET | Freq: Every day | ORAL | Status: DC
  Administered 2019-12-25 – 2019-12-31 (×7): 10 mg via ORAL
  Filled 2019-12-25 (×4): qty 2
  Filled 2019-12-25: qty 1
  Filled 2019-12-25 (×2): qty 2

## 2019-12-25 MED ORDER — FENTANYL CITRATE (PF) 100 MCG/2ML IJ SOLN: 25.0000 ug | INTRAMUSCULAR | Status: DC | PRN

## 2019-12-25 MED ORDER — SODIUM CHLORIDE 0.9% FLUSH
3.0000 mL | Freq: Once | INTRAVENOUS | Status: AC
  Administered 2019-12-25: 3 mL via INTRAVENOUS

## 2019-12-25 MED ORDER — ONDANSETRON HCL 4 MG/2ML IJ SOLN
4.0000 mg | Freq: Four times a day (QID) | INTRAMUSCULAR | Status: DC | PRN
  Administered 2019-12-26 – 2019-12-31 (×5): 4 mg via INTRAVENOUS
  Filled 2019-12-25 (×5): qty 2

## 2019-12-25 MED ORDER — METRONIDAZOLE IN NACL 5-0.79 MG/ML-% IV SOLN
500.0000 mg | Freq: Three times a day (TID) | INTRAVENOUS | Status: DC
  Administered 2019-12-25 – 2019-12-26 (×3): 500 mg via INTRAVENOUS
  Filled 2019-12-25 (×4): qty 100

## 2019-12-25 MED ORDER — METRONIDAZOLE IN NACL 5-0.79 MG/ML-% IV SOLN
500.0000 mg | Freq: Once | INTRAVENOUS | Status: AC
  Administered 2019-12-25: 500 mg via INTRAVENOUS
  Filled 2019-12-25: qty 100

## 2019-12-25 MED ORDER — ESTRADIOL 1 MG PO TABS
1.0000 mg | ORAL_TABLET | Freq: Every day | ORAL | Status: DC
  Administered 2019-12-25 – 2019-12-31 (×7): 1 mg via ORAL
  Filled 2019-12-25 (×7): qty 1

## 2019-12-25 MED ORDER — GABAPENTIN 300 MG PO CAPS: 300.0000 mg | ORAL_CAPSULE | ORAL | Status: DC

## 2019-12-25 NOTE — H&P (Signed)
History and Physical    Helen Hardy ZOX:[id number] DOB: March 02, 1951 DOA: 12/25/2019  PCP: Deland Pretty, MD  Patient coming from: home  I have personally briefly reviewed patient's old medical records in [place]  Chief Complaint: Abdominal pain and bloody diarrhea  HPI: Helen Hardy is a 69 y.o. female with medical history significant of non-Hodgkin's lymphoma (dx in 2004) in remission, rheumatoid arthritis since age 33, chronic pain, hypertension, GERD, osteoarthritis, lumbar radiculopathy, morbid obesity, anemia of chronic disease presented to the ED with abdominal pain and bloody diarrhea since about 5 PM yesterday after 2 days of feeling generally weak and unwell.  Prior to onset of pain yesterday, she drank a protein drink but has had these before without issue.  Patient describes the abdominal pain as extreme cramping, sharp at times and diffuse but worse on the right, 10 out of 10 in severity.  Reports 5 episodes of diarrhea with red blood.  Very little PO intake yesterday.  She denies associated nausea or vomiting.  No known fevers but reports she has been sweaty and currently feels chilled.  She does endorse generalized weakness and lightheadedness.  She otherwise denies recent acute symptoms including cough, chest pain, shortness of breath, dysuria, urinary frequency, focal weakness, or headaches.    ED Course: Temp 99, heart rate 98, respirations 18, initial BP 140/22, repeat 153/88.  O2 sat 90% on room air.  CBC was notable for very mild anemia hemoglobin of 12.  BMP was notable for creatinine of 1.41 (normal back in 2018 but no recent baseline) and BUN 27, glucose 139.  FOBT positive.  Review of Systems: As per HPI otherwise 10 point review of systems negative.    Past Medical History:  Diagnosis Date  . Anemia   . Anemia of chronic renal failure, stage 3 (moderate) 12/30/2018  . Arthritis    Rheumatoid  . Cancer ([place])    Non-Hodgkins Lymphoma  . GERD  (gastroesophageal reflux disease)   . Hypertension   . 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 Numbness and tingling of right leg   . Pneumonia     Past Surgical History:  Procedure Laterality Date  . 2012 Right big toe and toe beside - had pins in toes    . ABDOMINAL HYSTERECTOMY    . bladder tack - 2005    . EYE SURGERY Bilateral    cataract  . JOINT REPLACEMENT     both knee replacement  . LUMBAR LAMINECTOMY/DECOMPRESSION MICRODISCECTOMY N/A 03/04/2016   Procedure: RIGHT FAR LATERAL DISCECTOMY L5-S1;  Surgeon: Melina Schools, MD;  Location: [facility];  Service: Orthopedics;  Laterality: N/A;  . ROTATOR CUFF REPAIR Left   . TOTAL KNEE REVISION Left 04/08/2015   Procedure: REVISION LEFT TOTAL KNEE ;  Surgeon: Paralee Cancel, MD;  Location: [facility];  Service: Orthopedics;  Laterality: Left;     reports that she has never smoked. She has never used smokeless tobacco. She reports that she does not drink alcohol and does not use drugs.  Allergies  Allergen Reactions  . Tramadol   . Oxycodone Nausea And Vomiting    History reviewed. No pertinent family history.   Prior to Admission medications   Medication Sig Start Date End Date Taking? Authorizing Provider  Ascorbic Acid (VITAMIN C) 1000 MG tablet Take 1,000 mg by mouth daily.   Yes [provider]  Cholecalciferol (VITAMIN D3) 1000 units CAPS Take 2,000 Units by mouth daily.    Yes [provider]  estradiol (ESTRACE) 1 MG tablet Take 1 mg by mouth daily.  12/25/12  Yes [provider]  folic acid (FOLVITE) 1 MG tablet Take 2 mg by mouth daily.    Yes [provider]  furosemide (LASIX) 40 MG tablet Take 40 mg by mouth daily.  12/01/18  Yes [provider]  gabapentin (NEURONTIN) 300 MG capsule Take 300-600 mg by mouth See admin instructions. 300 mg in the morning and at 2 pm. 600 mg every night 03/17/18  Yes [provider]  HYDROcodone-acetaminophen (NORCO) 10-325 MG tablet Take 1 tablet  by mouth every 8 (eight) hours as needed for moderate pain or severe pain.  12/16/18  Yes [provider]  leflunomide (ARAVA) 20 MG tablet Take 20 mg by mouth daily.   Yes [provider]  methocarbamol (ROBAXIN) 500 MG tablet Take 750 mg by mouth in the morning and at bedtime.  03/11/18  Yes [provider]  Multiple Vitamin (MULTIVITAMIN) tablet Take 1 tablet by mouth daily.   Yes [provider]  olmesartan (BENICAR) 20 MG tablet Take 20 mg by mouth daily. 12/27/18  Yes [provider]  ondansetron (ZOFRAN) 4 MG tablet Take 4 mg by mouth every 8 (eight) hours as needed for nausea or vomiting.  04/10/19  Yes [provider]  oxyCODONE-acetaminophen (PERCOCET) 10-325 MG tablet Take 1 tablet by mouth every 8 (eight) hours as needed for pain.  12/04/19  Yes [provider]  pantoprazole (PROTONIX) 40 MG tablet Take 40 mg by mouth daily.    Yes [provider]  Potassium Chloride ER 20 MEQ TBCR Take 20 mEq by mouth daily.  12/22/18  Yes [provider]  predniSONE (DELTASONE) 10 MG tablet Take 10 mg by mouth daily. 04/12/18  Yes [provider]  triamcinolone cream (KENALOG) 0.1 % Apply 1 application topically as needed (for rash).    Yes [provider]  vitamin B-12 (CYANOCOBALAMIN) 1000 MCG tablet Take 1,000 mcg by mouth daily.   Yes [provider]    Physical Exam: Vitals:   12/25/19 0630 12/25/19 0700 12/25/19 0748 12/25/19 0823  BP: (!) 106/50 119/84 (!) 150/97   Pulse: 98 94 (!) 107   Resp: 19 (!) 21 (!) 23   Temp:    99.7 F (37.6 C)  TempSrc:    Oral  SpO2: 92% 95% 97%   Weight:      Height:         Constitutional: NAD, calm, comfortable, obese Eyes: lids and conjunctivae normal, EOMI ENMT: Mucous membranes are moist. Hearing grossly normal.  Respiratory: CTAB, no wheezing, no crackles. Normal respiratory effort. No accessory muscle use.  Cardiovascular: RRR, no murmurs / rubs  / gallops. Nonpitting LE edema. 2+ pedal pulses.  Abdomen: mildly tender on right side, ND, no masses or HSM palpated. +Bowel sounds.  Musculoskeletal: no clubbing / cyanosis. No joint deformity upper and lower extremities. Normal muscle tone.  Skin: dry, intact, normal color, normal temperature Neurologic: CN 2-12 grossly intact. Normal speech.  Grossly non-focal exam. Psychiatric: Alert and oriented x 3. Normal mood. Congruent affect.  Normal judgement and insight.   Labs on Admission: I have personally reviewed following labs and imaging studies  CBC: Recent Labs  Lab 12/25/19 0218  WBC 7.5  NEUTROABS 5.0  HGB 12.0  HCT 37.8  MCV 111.2*  PLT 937   Basic Metabolic Panel: Recent Labs  Lab 12/25/19 0209  NA 141  K 4.8  CL 102  CO2 27  GLUCOSE 139*  BUN 27*  CREATININE 1.41*  CALCIUM 9.1   GFR: Estimated Creatinine Clearance: 40.8 mL/min (A) (by C-G formula based on SCr of 1.41 mg/dL (H)). Liver Function Tests: Recent Labs  Lab 12/25/19 0209  AST 29  ALT 23  ALKPHOS 55  BILITOT 0.6  PROT 6.9  ALBUMIN 3.7   Recent Labs  Lab 12/25/19 0209  LIPASE 19   No results for input(s): AMMONIA in the last 168 hours. Coagulation Profile: No results for input(s): INR, PROTIME in the last 168 hours. Cardiac Enzymes: No results for input(s): CKTOTAL, CKMB, CKMBINDEX, TROPONINI in the last 168 hours. BNP (last 3 results) No results for input(s): PROBNP in the last 8760 hours. HbA1C: No results for input(s): HGBA1C in the last 72 hours. CBG: No results for input(s): GLUCAP in the last 168 hours. Lipid Profile: No results for input(s): CHOL, HDL, LDLCALC, TRIG, CHOLHDL, LDLDIRECT in the last 72 hours. Thyroid Function Tests: No results for input(s): TSH, T4TOTAL, FREET4, T3FREE, THYROIDAB in the last 72 hours. Anemia Panel: No results for input(s): VITAMINB12, FOLATE, FERRITIN, TIBC, IRON, RETICCTPCT in the last 72 hours. Urine analysis:    Component Value Date/Time    COLORURINE YELLOW 08/17/2017 [place] 08/17/2017 1305   LABSPEC >1.046 (H) 08/17/2017 1305   PHURINE 5.0 08/17/2017 1305   GLUCOSEU NEGATIVE 08/17/2017 1305   HGBUR NEGATIVE 08/17/2017 1305   BILIRUBINUR NEGATIVE 08/17/2017 1305   KETONESUR NEGATIVE 08/17/2017 1305   PROTEINUR NEGATIVE 08/17/2017 1305   UROBILINOGEN 0.2 03/29/2015 0847   NITRITE NEGATIVE 08/17/2017 1305   LEUKOCYTESUR NEGATIVE 08/17/2017 1305    Radiological Exams on Admission: CT ABDOMEN PELVIS W CONTRAST  Result Date: 12/25/2019 CLINICAL DATA:  Lower GI bleeding and diarrhea EXAM: CT ABDOMEN AND PELVIS WITH CONTRAST TECHNIQUE: Multidetector CT imaging of the abdomen and pelvis was performed using the standard protocol following bolus administration of intravenous contrast. CONTRAST:  44m OMNIPAQUE IOHEXOL 300 MG/ML  SOLN COMPARISON:  August 17, 2017 FINDINGS: Lower chest: The visualized heart size within normal limits. No pericardial fluid/thickening. No hiatal hernia. Posterior bibasilar ground-glass atelectasis is noted. Hepatobiliary: The liver is normal in density without focal abnormality.The main portal vein is patent. No evidence of calcified gallstones, gallbladder wall thickening or biliary dilatation. Pancreas: Unremarkable. No pancreatic ductal dilatation or surrounding inflammatory changes. Spleen: Normal in size without focal abnormality. Adrenals/Urinary Tract: Both adrenal glands appear normal. The kidneys and collecting system appear normal without evidence of urinary tract calculus or hydronephrosis. Bladder is unremarkable. Stomach/Bowel: The stomach and small bowel are normal in appearance. There is diffuse wall thickening with diverticula and surrounding inflammatory changes involving the cecum and terminal ileum. Scattered small lymph nodes are seen within the right lower quadrant. No surrounding loculated fluid collections or free air. The appendix appears to be mildly prominent  measuring 7 mm in transverse dimension, however there is small foci of air seen within the appendix. No surrounding inflammatory changes seen around the appendiceal tip. Vascular/Lymphatic: There are no enlarged mesenteric, retroperitoneal, or pelvic lymph nodes. No significant vascular findings are present. Reproductive: The patient is status post hysterectomy. No adnexal masses or collections seen. Other: No evidence of abdominal wall mass or hernia. Musculoskeletal: No acute or significant osseous findings. IMPRESSION: Findings consistent with diffuse diverticulitis/enteritis involving the cecum and terminal ileum with significant surrounding inflammatory changes. No loculated fluid collections or free air. Mildly prominent appendix, however no definite evidence of appendicitis. Electronically Signed   By:  Prudencio Pair M.D.   On: 12/25/2019 04:38    EKG: none  Assessment/Plan Principal Problem:   Cecal diverticulitis Active Problems:   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HCC)   AKI (acute kidney injury) (HCC)   Generalized weakness   Essential hypertension   Hyperglycemia   Rheumatoid arthritis (HCC)   GERD (gastroesophageal reflux disease)   Chronic radicular lumbar pain   Morbid obesity (HCC)    Cecal diverticulitis and Terminal ileitis - POA with abdominal pain, bloody diarrhea and CT findings as above without evidence of perforation or abscess or appendicitis.  Continue on IV Cipro and Flagyl.  IV fluids. Bowel rest, NPO for now except ice chips and sips with meds.  Pain control.  Consult GI (amion currently down, will notify once on call schedule available).  AKI (acute kidney injury) - POA with Cr 1.41.  No recent baseline but normal Cr in 2018.  Hold ARB and Lasix.  IV hydration as suspect pre-renal due to poor PO intake and diarrhea.  BMP's daily to monitor.  Generalized Weakness - due to acute illness and chronic pain.  Ambulates with cane or walker at baseline.  PT evaluation when feeling  better.  Essential hypertension - chronic.  Hold home ARB and Lasix given AKI.  Hyperglycemia - POA.  No history of diabetes per patient.  Is on chronic steroids.  Check A1c.  CBG's.  Sensitive sliding scale Novolog.  Rheumatoid arthritis - not acutely flared.  Continue prednisone, leflunomide.  Chronic Radicular Lumbar Pain - continue gabapentin and Robaxin.  Pain meds per orders.  Consider PT evaluation when feeling better.  GERD - continue PPI.  Morbid obesity - Body mass index is 45.5 kg/m.  Complicates overall care and prognosis   DVT prophylaxis: SCD's   Code Status: full  Family Communication: husband at bedside during encounter.  Disposition Plan: expect d/c home pending improvement and tolerating diet  Consults called: gastroenterology    Admission status:  Status is: Inpatient  Remains inpatient appropriate because:IV treatments appropriate due to intensity of illness or inability to take PO   Dispo: The patient is from: Home              Anticipated d/c is to: Home              Anticipated d/c date is: 2 days              Patient currently is not medically stable to d/c.         Ezekiel Slocumb, DO [facility]  12/25/2019, 8:34 AM    If 7PM-7AM, please contact night-coverage. How to contact the [facility] Attending or Consulting provider [place] or covering provider during after hours [place], for this patient?    1. Check the care team in [facility] and look for a) attending/consulting [facility] provider listed and b) the [facility] team listed 2. Log into [email or web address] and use [place]'s universal password to access. If you do not have the password, please contact the hospital operator. 3. Locate the [facility] provider you are looking for under [facility] and page to a number that you can be directly reached. 4. If you still have difficulty reaching the provider, please page the [facility] (Director on Call) for the Hospitalists listed on amion for assistance.

## 2019-12-25 NOTE — ED Notes (Signed)
Patient repositioned into hospital bed for comfort. DO made aware of rectal temperature. Waiting for pharmacy to verify tylenol.

## 2019-12-25 NOTE — ED Provider Notes (Signed)
[facility] DEPT Provider Note: Georgena Spurling, MD, FACEP  CSN: [id number] MRN: [id number] ARRIVAL: 12/25/19 at [place]: [place]  Abdominal Pain   HISTORY OF PRESENT ILLNESS  12/25/19 2:17 AM Helen Hardy is a 69 y.o. female with abdominal pain and bloody diarrhea that began yesterday afternoon about 5 PM.  The pain in her abdomen is sharp and diffusely located.  She rates it as a 10 out of 10.  She denies nausea, vomiting, fever, chills.  She has been weak and lightheaded.  She has a history of non-Hodgkin's lymphoma since 2004 and rheumatoid arthritis since age 2.   Past Medical History:  Diagnosis Date  . Anemia   . Anemia of chronic renal failure, stage 3 (moderate) 12/30/2018  . Arthritis    Rheumatoid  . Cancer ([place])    Non-Hodgkins Lymphoma  . GERD (gastroesophageal reflux disease)   . Hypertension   . 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 Numbness and tingling of right leg   . Pneumonia     Past Surgical History:  Procedure Laterality Date  . 2012 Right big toe and toe beside - had pins in toes    . ABDOMINAL HYSTERECTOMY    . bladder tack - 2005    . EYE SURGERY Bilateral    cataract  . JOINT REPLACEMENT     both knee replacement  . LUMBAR LAMINECTOMY/DECOMPRESSION MICRODISCECTOMY N/A 03/04/2016   Procedure: RIGHT FAR LATERAL DISCECTOMY L5-S1;  Surgeon: Melina Schools, MD;  Location: [facility];  Service: Orthopedics;  Laterality: N/A;  . ROTATOR CUFF REPAIR Left   . TOTAL KNEE REVISION Left 04/08/2015   Procedure: REVISION LEFT TOTAL KNEE ;  Surgeon: Paralee Cancel, MD;  Location: [facility];  Service: Orthopedics;  Laterality: Left;    History reviewed. No pertinent family history.  Social History   Tobacco Use  . Smoking status: Never Smoker  . Smokeless tobacco: Never Used  Vaping Use  . Vaping Use: Never used  Substance Use Topics  . Alcohol use: No    Alcohol/week: 0.0 standard drinks  . Drug use: No    Prior to Admission  medications   Medication Sig Start Date End Date Taking? Authorizing Provider  Ascorbic Acid (VITAMIN C) 1000 MG tablet Take 1,000 mg by mouth daily.   Yes [provider]  Cholecalciferol (VITAMIN D3) 1000 units CAPS Take 2,000 Units by mouth daily.    Yes [provider]  estradiol (ESTRACE) 1 MG tablet Take 1 mg by mouth daily.  12/25/12  Yes [provider]  folic acid (FOLVITE) 1 MG tablet Take 2 mg by mouth daily.    Yes [provider]  furosemide (LASIX) 40 MG tablet Take 40 mg by mouth daily.  12/01/18  Yes [provider]  gabapentin (NEURONTIN) 300 MG capsule Take 300-600 mg by mouth See admin instructions. 300 mg in the morning and at 2 pm. 600 mg every night 03/17/18  Yes [provider]  HYDROcodone-acetaminophen (NORCO) 10-325 MG tablet Take 1 tablet by mouth every 8 (eight) hours as needed for moderate pain or severe pain.  12/16/18  Yes [provider]  leflunomide (ARAVA) 20 MG tablet Take 20 mg by mouth daily.   Yes [provider]  methocarbamol (ROBAXIN) 500 MG tablet Take 750 mg by mouth in the morning and at bedtime.  03/11/18  Yes [provider]  Multiple Vitamin (MULTIVITAMIN) tablet Take 1 tablet by mouth daily.   Yes  [provider]  olmesartan (BENICAR) 20 MG tablet Take 20 mg by mouth daily. 12/27/18  Yes [provider]  ondansetron (ZOFRAN) 4 MG tablet Take 4 mg by mouth every 8 (eight) hours as needed for nausea or vomiting.  04/10/19  Yes [provider]  oxyCODONE-acetaminophen (PERCOCET) 10-325 MG tablet Take 1 tablet by mouth every 8 (eight) hours as needed for pain.  12/04/19  Yes [provider]  pantoprazole (PROTONIX) 40 MG tablet Take 40 mg by mouth daily.    Yes [provider]  Potassium Chloride ER 20 MEQ TBCR Take 20 mEq by mouth daily.  12/22/18  Yes [provider]  predniSONE (DELTASONE) 10 MG tablet Take 10 mg by mouth daily.  04/12/18  Yes [provider]  triamcinolone cream (KENALOG) 0.1 % Apply 1 application topically as needed (for rash).    Yes [provider]  vitamin B-12 (CYANOCOBALAMIN) 1000 MCG tablet Take 1,000 mcg by mouth daily.   Yes [provider]    Allergies Tramadol and Oxycodone   REVIEW OF SYSTEMS  Negative except as noted here or in the History of Present Illness.   PHYSICAL EXAMINATION  Initial Vital Signs Blood pressure (!) 140/122, pulse 98, temperature 99 F (37.2 C), temperature source Oral, resp. rate 18, height 5' (1.524 m), weight 105.7 kg, SpO2 98 %.  Examination General: Well-developed, well-nourished female in no acute distress; appearance consistent with age of record HENT: normocephalic; atraumatic Eyes: pupils equal, round and reactive to light; extraocular muscles intact; pale conjunctive a \ Neck: supple Heart: regular rate and rhythm Lungs: clear to auscultation bilaterally Abdomen: soft; nondistended; diffusely tender; bowel sounds present Rectal: Grossly bloody stool in rectal vault; Hemoccult positive Extremities: No deformity; full range of motion; pulses normal Neurologic: Awake, alert and oriented; motor function intact in all extremities and symmetric; no facial droop Skin: Warm and dry Psychiatric: Normal mood and affect   RESULTS  Summary of this visit's results, reviewed and interpreted by myself:   EKG Interpretation  Date/Time:    Ventricular Rate:    PR Interval:    QRS Duration:   QT Interval:    QTC Calculation:   R Axis:     Text Interpretation:        Laboratory Studies: Results for orders placed or performed during the hospital encounter of 12/25/19 (from the past 24 hour(s))  Lipase, blood     Status: None   Collection Time: 12/25/19  2:09 AM  Result Value Ref Range   Lipase 19 11 - 51 U/L  Comprehensive metabolic panel     Status: Abnormal   Collection Time: 12/25/19  2:09 AM  Result Value Ref  Range   Sodium 141 135 - 145 mmol/L   Potassium 4.8 3.5 - 5.1 mmol/L   Chloride 102 98 - 111 mmol/L   CO2 27 22 - 32 mmol/L   Glucose, Bld 139 (H) 70 - 99 mg/dL   BUN 27 (H) 8 - 23 mg/dL   Creatinine, Ser 1.41 (H) 0.44 - 1.00 mg/dL   Calcium 9.1 8.9 - 10.3 mg/dL   Total Protein 6.9 6.5 - 8.1 g/dL   Albumin 3.7 3.5 - 5.0 g/dL   AST 29 15 - 41 U/L   ALT 23 0 - 44 U/L   Alkaline Phosphatase 55 38 - 126 U/L   Total Bilirubin 0.6 0.3 - 1.2 mg/dL   GFR calc non Af Amer 38 (L) >60 mL/min   GFR calc Af  Amer 44 (L) >60 mL/min   Anion gap 12 5 - 15  Type and screen [place]     Status: None   Collection Time: 12/25/19  2:09 AM  Result Value Ref Range   ABO/RH(D) O POS    Antibody Screen NEG    Sample Expiration      12/28/2019,2359 Performed at [facility], [place] [address], [place], [place] [address]   ABO/Rh     Status: None (Preliminary result)   Collection Time: 12/25/19  2:09 AM  Result Value Ref Range   ABO/RH(D)      O POS Performed at [facility], [place] [address], [place], [place] [address]   CBC with Differential/Platelet     Status: Abnormal   Collection Time: 12/25/19  2:18 AM  Result Value Ref Range   WBC 7.5 4.0 - 10.5 K/uL   RBC 3.40 (L) 3.87 - 5.11 MIL/uL   Hemoglobin 12.0 12.0 - 15.0 g/dL   HCT 37.8 36 - 46 %   MCV 111.2 (H) 80.0 - 100.0 fL   MCH 35.3 (H) 26.0 - 34.0 pg   MCHC 31.7 30.0 - 36.0 g/dL   RDW 17.0 (H) 11.5 - 15.5 %   Platelets 197 150 - 400 K/uL   nRBC 0.0 0.0 - 0.2 %   Neutrophils Relative % 67 %   Neutro Abs 5.0 1.7 - 7.7 K/uL   Lymphocytes Relative 25 %   Lymphs Abs 1.9 0.7 - 4.0 K/uL   Monocytes Relative 7 %   Monocytes Absolute 0.5 0 - 1 K/uL   Eosinophils Relative 0 %   Eosinophils Absolute 0.0 0 - 0 K/uL   Basophils Relative 0 %   Basophils Absolute 0.0 0 - 0 K/uL   Immature Granulocytes 1 %   Abs Immature Granulocytes 0.07 0.00 - 0.07 K/uL  POC occult blood, ED     Status:  Abnormal   Collection Time: 12/25/19  2:47 AM  Result Value Ref Range   Fecal Occult Bld POSITIVE (A) NEGATIVE   Imaging Studies: CT ABDOMEN PELVIS W CONTRAST  Result Date: 12/25/2019 CLINICAL DATA:  Lower GI bleeding and diarrhea EXAM: CT ABDOMEN AND PELVIS WITH CONTRAST TECHNIQUE: Multidetector CT imaging of the abdomen and pelvis was performed using the standard protocol following bolus administration of intravenous contrast. CONTRAST:  89m OMNIPAQUE IOHEXOL 300 MG/ML  SOLN COMPARISON:  August 17, 2017 FINDINGS: Lower chest: The visualized heart size within normal limits. No pericardial fluid/thickening. No hiatal hernia. Posterior bibasilar ground-glass atelectasis is noted. Hepatobiliary: The liver is normal in density without focal abnormality.The main portal vein is patent. No evidence of calcified gallstones, gallbladder wall thickening or biliary dilatation. Pancreas: Unremarkable. No pancreatic ductal dilatation or surrounding inflammatory changes. Spleen: Normal in size without focal abnormality. Adrenals/Urinary Tract: Both adrenal glands appear normal. The kidneys and collecting system appear normal without evidence of urinary tract calculus or hydronephrosis. Bladder is unremarkable. Stomach/Bowel: The stomach and small bowel are normal in appearance. There is diffuse wall thickening with diverticula and surrounding inflammatory changes involving the cecum and terminal ileum. Scattered small lymph nodes are seen within the right lower quadrant. No surrounding loculated fluid collections or free air. The appendix appears to be mildly prominent measuring 7 mm in transverse dimension, however there is small foci of air seen within the appendix. No surrounding inflammatory changes seen around the appendiceal tip. Vascular/Lymphatic: There are no enlarged mesenteric, retroperitoneal, or pelvic lymph nodes. No significant  vascular findings are present. Reproductive: The patient is status post  hysterectomy. No adnexal masses or collections seen. Other: No evidence of abdominal wall mass or hernia. Musculoskeletal: No acute or significant osseous findings. IMPRESSION: Findings consistent with diffuse diverticulitis/enteritis involving the cecum and terminal ileum with significant surrounding inflammatory changes. No loculated fluid collections or free air. Mildly prominent appendix, however no definite evidence of appendicitis. Electronically Signed   By: Prudencio Pair M.D.   On: 12/25/2019 04:38    ED COURSE and MDM  Nursing notes, initial and subsequent vitals signs, including pulse oximetry, reviewed and interpreted by myself.  Vitals:   12/25/19 0209 12/25/19 0229 12/25/19 0330 12/25/19 0457  BP: (!) 140/122 (!) 153/88 (!) 161/93 (!) 107/93  Pulse: 98 92 92 95  Resp: 18 18 (!) 23 16  Temp: 99 F (37.2 C)     TempSrc: Oral     SpO2: 98% 97% 97% 98%  Weight:      Height:       Medications  ciprofloxacin (CIPRO) IVPB 400 mg (has no administration in time range)  metroNIDAZOLE (FLAGYL) IVPB 500 mg (has no administration in time range)  sodium chloride flush (NS) 0.9 % injection 3 mL (3 mLs Intravenous Given by Other 12/25/19 0440)  sodium chloride 0.9 % bolus 500 mL (500 mLs Intravenous New Bag/Given 12/25/19 0247)  iohexol (OMNIPAQUE) 300 MG/ML solution 100 mL (80 mLs Intravenous Contrast Given 12/25/19 0411)   6:13 AM Antibiotics ordered for possible diverticulitis or bacterial enteritis.  We will have patient admitted to the hospitalist service.   PROCEDURES  Procedures   ED DIAGNOSES     ICD-10-CM   1. Diverticulitis of cecum  K57.32   2. Lower GI bleed  K92.2   3. AKI (acute kidney injury) ([place])  N17.9   4. Macrocytosis without anemia  D75.89        Josean Lycan, Jenny Reichmann, MD 12/25/19 [phone]

## 2019-12-25 NOTE — ED Notes (Addendum)
Arbutus Ped, DO at bedside.   Patient repositioned in bed. New pure wick placed. Patient encouraged to void. New BP cuff placed on right upper arm.

## 2019-12-25 NOTE — Consult Note (Signed)
Reason for Consult: Rectal bleeding/Abdominal pain. Referring Physician: THP Helen Hardy is an 69 y.o. female.  HPI: [name] reported Ronnald Hardy is a 69 year old black female with multiple medical problems listed below, known to Dr. 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  Patient has not seen Dr. Benson Norway over the last 4 years.. She claims she was doing well and was in her baseline state of health till yesterday when started developing loose stools with severe abdominal pain predominantly in the right lower quadrant.  She has significant rectal bleeding with the diarrhea but denies having any chills or rigors. She has rheumatoid arthritis and the been taking narcotics for the pain but denies the use of nonsteroidals.  CT scan on admission revealed diffuse diverticulosis with diverticulitis/enteritis in the cecum and terminal ileum with significant surrounding inflammatory changes but no localized fluid collection or free air; the appendix is mildly prominent with no evidence of appendicitis.  Patient suffers from chronic constipation and can go 2 to 3 days without a bowel movement but has not had any melena hematochezia in the past.  She denies any sick contacts.  Past Medical History:  Diagnosis Date  .  Morbid obesity   . Anemia of chronic renal failure, stage 3 (moderate) 12/30/2018   Rheumatoid arthritis/lumbar lumbar radiculopathy/chronic pain   Non-Hodgkins Lymphoma-2004  . GERD (gastroesophageal reflux disease)   . Hypertension   . 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 Numbness and tingling of right leg   . Pneumonia    Past Surgical History:  Procedure Laterality Date  . 2012 Right big toe and toe beside - had pins in toes    . ABDOMINAL HYSTERECTOMY    . bladder tack - 2005    . EYE SURGERY Bilateral    cataract  . JOINT REPLACEMENT     both knee replacement  . LUMBAR LAMINECTOMY/DECOMPRESSION MICRODISCECTOMY  N/A 03/04/2016   Procedure: RIGHT FAR LATERAL DISCECTOMY L5-S1;  Surgeon: Melina Schools, MD;  Location: [facility];  Service: Orthopedics;  Laterality: N/A;  . ROTATOR CUFF REPAIR Left   . TOTAL KNEE REVISION Left 04/08/2015   Procedure: REVISION LEFT TOTAL KNEE ;  Surgeon: Paralee Cancel, MD;  Location: [facility];  Service: Orthopedics;  Laterality: Left;    History reviewed. No pertinent family history.  Social History:  reports that she has never smoked. She has never used smokeless tobacco. She reports that she does not drink alcohol and does not use drugs.  Allergies:  Allergies  Allergen Reactions  . Tramadol   . Oxycodone Nausea And Vomiting   Medications: I have reviewed the patient's current medications.  Results for orders placed or performed during the hospital encounter of 12/25/19 (from the past 48 hour(s))  Lipase, blood     Status: None   Collection Time: 12/25/19  2:09 AM  Result Value Ref Range   Lipase 19 11 - 51 U/L    Comment: Performed at [facility], [place] [address], [place], [place] [address]  Comprehensive metabolic panel     Status: Abnormal   Collection Time: 12/25/19  2:09 AM  Result Value Ref Range   Sodium 141 135 - 145 mmol/L   Potassium 4.8 3.5 - 5.1 mmol/L   Chloride 102 98 - 111 mmol/L   CO2 27 22 - 32 mmol/L   Glucose, Bld 139 (H) 70 - 99 mg/dL    Comment: Glucose reference  range applies only to samples taken after fasting for at least 8 hours.   BUN 27 (H) 8 - 23 mg/dL   Creatinine, Ser 1.41 (H) 0.44 - 1.00 mg/dL   Calcium 9.1 8.9 - 10.3 mg/dL   Total Protein 6.9 6.5 - 8.1 g/dL   Albumin 3.7 3.5 - 5.0 g/dL   AST 29 15 - 41 U/L   ALT 23 0 - 44 U/L   Alkaline Phosphatase 55 38 - 126 U/L   Total Bilirubin 0.6 0.3 - 1.2 mg/dL   GFR calc non Af Amer 38 (L) >60 mL/min   GFR calc Af Amer 44 (L) >60 mL/min   Anion gap 12 5 - 15    Comment: Performed at [facility], [place] [address] [place], [place] [address]  Type and  screen [place]     Status: None   Collection Time: 12/25/19  2:09 AM  Result Value Ref Range   ABO/RH(D) O POS    Antibody Screen NEG    Sample Expiration      12/28/2019,2359 Performed at [facility], [place] [address] [place], [place] [address]   ABO/Rh     Status: None   Collection Time: 12/25/19  2:09 AM  Result Value Ref Range   ABO/RH(D)      O POS Performed at [facility], [place] [address], [place], [place] [address]   CBC with Differential/Platelet     Status: Abnormal   Collection Time: 12/25/19  2:18 AM  Result Value Ref Range   WBC 7.5 4.0 - 10.5 K/uL   RBC 3.40 (L) 3.87 - 5.11 MIL/uL   Hemoglobin 12.0 12.0 - 15.0 g/dL   HCT 37.8 36 - 46 %   MCV 111.2 (H) 80.0 - 100.0 fL   MCH 35.3 (H) 26.0 - 34.0 pg   MCHC 31.7 30.0 - 36.0 g/dL   RDW 17.0 (H) 11.5 - 15.5 %   Platelets 197 150 - 400 K/uL   nRBC 0.0 0.0 - 0.2 %   Neutrophils Relative % 67 %   Neutro Abs 5.0 1.7 - 7.7 K/uL   Lymphocytes Relative 25 %   Lymphs Abs 1.9 0.7 - 4.0 K/uL   Monocytes Relative 7 %   Monocytes Absolute 0.5 0 - 1 K/uL   Eosinophils Relative 0 %   Eosinophils Absolute 0.0 0 - 0 K/uL   Basophils Relative 0 %   Basophils Absolute 0.0 0 - 0 K/uL   Immature Granulocytes 1 %   Abs Immature Granulocytes 0.07 0.00 - 0.07 K/uL    Comment: Performed at [facility], [place] [address] [place], [place] [address]  POC occult blood, ED     Status: Abnormal   Collection Time: 12/25/19  2:47 AM  Result Value Ref Range   Fecal Occult Bld POSITIVE (A) NEGATIVE  SARS Coronavirus 2 by RT PCR (hospital order, performed in [facility] hospital lab) Nasopharyngeal Nasopharyngeal Swab     Status: None   Collection Time: 12/25/19  6:32 AM   Specimen: Nasopharyngeal Swab  Result Value Ref Range   SARS Coronavirus 2 NEGATIVE NEGATIVE    Comment: (NOTE) SARS-CoV-2 target nucleic acids are NOT DETECTED.  The SARS-CoV-2 RNA is  generally detectable in upper and lower respiratory specimens during the acute phase of infection. The lowest concentration of SARS-CoV-2 viral copies this assay can detect is 250 copies / mL. A negative result does not preclude SARS-CoV-2 infection and  should not be used as the sole basis for treatment or other patient management decisions.  A negative result may occur with improper specimen collection / handling, submission of specimen other than nasopharyngeal swab, presence of viral mutation(s) within the areas targeted by this assay, and inadequate number of viral copies (<250 copies / mL). A negative result must be combined with clinical observations, patient history, and epidemiological information.  Fact Sheet for Patients:   StrictlyIdeas.no  Fact Sheet for Healthcare Providers: BankingDealers.co.za  This test is not yet approved or  cleared by the [place] FDA and has been authorized for detection and/or diagnosis of SARS-CoV-2 by FDA under an Emergency Use Authorization (EUA).  This EUA will remain in effect (meaning this test can be used) for the duration of the COVID-19 declaration under Section 564(b)(1) of the Act, 21 U.S.C. section 360bbb-3(b)(1), unless the authorization is terminated or revoked sooner.  Performed at [facility], [place] [address], [place], [place] [address]     CT ABDOMEN PELVIS W CONTRAST  Result Date: 12/25/2019 CLINICAL DATA:  Lower GI bleeding and diarrhea EXAM: CT ABDOMEN AND PELVIS WITH CONTRAST TECHNIQUE: Multidetector CT imaging of the abdomen and pelvis was performed using the standard protocol following bolus administration of intravenous contrast. CONTRAST:  22m OMNIPAQUE IOHEXOL 300 MG/ML  SOLN COMPARISON:  August 17, 2017 FINDINGS: Lower chest: The visualized heart size within normal limits. No pericardial fluid/thickening. No hiatal hernia. Posterior bibasilar  ground-glass atelectasis is noted. Hepatobiliary: The liver is normal in density without focal abnormality.The main portal vein is patent. No evidence of calcified gallstones, gallbladder wall thickening or biliary dilatation. Pancreas: Unremarkable. No pancreatic ductal dilatation or surrounding inflammatory changes. Spleen: Normal in size without focal abnormality. Adrenals/Urinary Tract: Both adrenal glands appear normal. The kidneys and collecting system appear normal without evidence of urinary tract calculus or hydronephrosis. Bladder is unremarkable. Stomach/Bowel: The stomach and small bowel are normal in appearance. There is diffuse wall thickening with diverticula and surrounding inflammatory changes involving the cecum and terminal ileum. Scattered small lymph nodes are seen within the right lower quadrant. No surrounding loculated fluid collections or free air. The appendix appears to be mildly prominent measuring 7 mm in transverse dimension, however there is small foci of air seen within the appendix. No surrounding inflammatory changes seen around the appendiceal tip. Vascular/Lymphatic: There are no enlarged mesenteric, retroperitoneal, or pelvic lymph nodes. No significant vascular findings are present. Reproductive: The patient is status post hysterectomy. No adnexal masses or collections seen. Other: No evidence of abdominal wall mass or hernia. Musculoskeletal: No acute or significant osseous findings. IMPRESSION: Findings consistent with diffuse diverticulitis/enteritis involving the cecum and terminal ileum with significant surrounding inflammatory changes. No loculated fluid collections or free air. Mildly prominent appendix, however no definite evidence of appendicitis. Electronically Signed   By: BPrudencio PairM.D.   On: 12/25/2019 04:38   ROS:  As stated above in the HPI otherwise negative.  Blood pressure (!) 118/39, pulse 96, temperature (S) (!) 102.8 F (39.3 C), temperature source  Rectal, resp. rate (!) 31, height 5' (1.524 m), weight 105.7 kg, SpO2 91 %.  PE: Morbidly morbidly obese black female in mild distress Gen: Alert and Oriented HEENT:  [place]/AT, EOMI Neck: Supple, no LAD Lungs: CTA Bilaterally CV: RRR without M/G/R ABM: Soft, morbidly obese with diffuse abdominal tenderness on palpation with guarding hypoactive bowel sounds; patient seems to be most tender in the right lower quadrant Ext: No C/C/E  Assessment/Plan: 1)  Acute diverticulitis/enteritis on CT scan in the setting of diffuse pandiverticulosis and rectal bleeding with diarrhea-agree with broad-spectrum antibiotics and ice chips for now.  2) History of gastroesophageal reflux disease . 3) Acute kidney injury-ARB use and Lasix on hold for now.[place] 4) HTN. 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 6) Hyperglycemia/morbid obesity. Juanita Craver 12/25/2019, 12:53 PM

## 2019-12-25 NOTE — Progress Notes (Signed)
AC made aware that this patient pended to 5 [facility] telemetry unit.  This charge nurse concerned with the appropriateness of this assignment due to patient's elevated MEWS score.    Virginia Rochester, RN

## 2019-12-25 NOTE — ED Triage Notes (Signed)
Patient is complaining of abdominal pain with diarrhea/bright red color stool since 5pm.

## 2019-12-25 NOTE — ED Notes (Addendum)
Patient soiled her brief. New brief and new pure wick placed. Patient repositioned in bed.   Unable to obtain urine sample at this time due to UA mixed with stool.   Arbutus Ped, DO aware of patients diarrhea.

## 2019-12-26 ENCOUNTER — Telehealth: Payer: Self-pay | Admitting: *Deleted

## 2019-12-26 DIAGNOSIS — M5416 Radiculopathy, lumbar region: Secondary | ICD-10-CM

## 2019-12-26 DIAGNOSIS — K219 Gastro-esophageal reflux disease without esophagitis: Secondary | ICD-10-CM

## 2019-12-26 DIAGNOSIS — K5792 Diverticulitis of intestine, part unspecified, without perforation or abscess without bleeding: Secondary | ICD-10-CM

## 2019-12-26 DIAGNOSIS — G8929 Other chronic pain: Secondary | ICD-10-CM

## 2019-12-26 DIAGNOSIS — I10 Essential (primary) hypertension: Secondary | ICD-10-CM

## 2019-12-26 LAB — CBC WITH DIFFERENTIAL/PLATELET
Abs Immature Granulocytes: 0.15 10*3/uL — ABNORMAL HIGH (ref 0.00–0.07)
Basophils Absolute: 0 10*3/uL (ref 0.0–0.1)
Basophils Relative: 0 %
Eosinophils Absolute: 0 10*3/uL (ref 0.0–0.5)
Eosinophils Relative: 0 %
HCT: 35.6 % — ABNORMAL LOW (ref 36.0–46.0)
Hemoglobin: 11.4 g/dL — ABNORMAL LOW (ref 12.0–15.0)
Immature Granulocytes: 1 %
Lymphocytes Relative: 22 %
Lymphs Abs: 2.5 10*3/uL (ref 0.7–4.0)
MCH: 36 pg — ABNORMAL HIGH (ref 26.0–34.0)
MCHC: 32 g/dL (ref 30.0–36.0)
MCV: 112.3 fL — ABNORMAL HIGH (ref 80.0–100.0)
Monocytes Absolute: 0.7 10*3/uL (ref 0.1–1.0)
Monocytes Relative: 6 %
Neutro Abs: 8 10*3/uL — ABNORMAL HIGH (ref 1.7–7.7)
Neutrophils Relative %: 71 %
Platelets: 161 10*3/uL (ref 150–400)
RBC: 3.17 MIL/uL — ABNORMAL LOW (ref 3.87–5.11)
RDW: 17.1 % — ABNORMAL HIGH (ref 11.5–15.5)
WBC: 11.4 10*3/uL — ABNORMAL HIGH (ref 4.0–10.5)
nRBC: 0 % (ref 0.0–0.2)

## 2019-12-26 LAB — BASIC METABOLIC PANEL
Anion gap: 11 (ref 5–15)
BUN: 24 mg/dL — ABNORMAL HIGH (ref 8–23)
CO2: 20 mmol/L — ABNORMAL LOW (ref 22–32)
Calcium: 8.8 mg/dL — ABNORMAL LOW (ref 8.9–10.3)
Chloride: 107 mmol/L (ref 98–111)
Creatinine, Ser: 1.29 mg/dL — ABNORMAL HIGH (ref 0.44–1.00)
GFR calc Af Amer: 49 mL/min — ABNORMAL LOW (ref >60)
GFR calc non Af Amer: 42 mL/min — ABNORMAL LOW (ref >60)
Glucose, Bld: 102 mg/dL — ABNORMAL HIGH (ref 70–99)
Potassium: 4.3 mmol/L (ref 3.5–5.1)
Sodium: 138 mmol/L (ref 135–145)

## 2019-12-26 LAB — C DIFFICILE QUICK SCREEN W PCR REFLEX
C Diff antigen: NEGATIVE
C Diff interpretation: NOT DETECTED
C Diff toxin: NEGATIVE

## 2019-12-26 MED ORDER — PIPERACILLIN-TAZOBACTAM 3.375 G IVPB 30 MIN: 3.3750 g | Freq: Four times a day (QID) | INTRAVENOUS | Status: DC

## 2019-12-26 MED ORDER — PIPERACILLIN-TAZOBACTAM 3.375 G IVPB
3.3750 g | Freq: Three times a day (TID) | INTRAVENOUS | Status: DC
  Administered 2019-12-26 – 2019-12-27 (×4): 3.375 g via INTRAVENOUS
  Filled 2019-12-26 (×4): qty 50

## 2019-12-26 MED ORDER — ZINC OXIDE 40 % EX OINT
TOPICAL_OINTMENT | Freq: Three times a day (TID) | CUTANEOUS | Status: DC | PRN
  Filled 2019-12-26 (×2): qty 57

## 2019-12-26 MED ORDER — KATE FARMS STANDARD 1.4 PO LIQD
325.0000 mL | ORAL | Status: DC
  Administered 2019-12-27: 325 mL via ORAL
  Filled 2019-12-26 (×5): qty 325

## 2019-12-26 MED ORDER — PRO-STAT SUGAR FREE PO LIQD
30.0000 mL | Freq: Two times a day (BID) | ORAL | Status: DC
  Administered 2019-12-26 – 2019-12-31 (×10): 30 mL via ORAL
  Filled 2019-12-26 (×10): qty 30

## 2019-12-26 MED ORDER — DEXTROSE IN LACTATED RINGERS 5 % IV SOLN: INTRAVENOUS | Status: DC

## 2019-12-26 NOTE — Progress Notes (Signed)
Pharmacy Antibiotic Note  Helen Hardy is a 69 y.o. female admitted on 12/25/2019 with acute cecal diverticulitis and terminal ileitis.  Pharmacy has been consulted for zosyn dosing.  Plan: Zosyn 3.375g IV Q8H infused over 4hrs. Pharmacy will sign off and follow peripherally  Height: 4' 11"  (149.9 cm) Weight: 101.8 kg (224 lb 6.4 oz) IBW/kg (Calculated) : 43.2  Temp (24hrs), Avg:100.2 F (37.9 C), Min:98.6 F (37 C), Max:102.8 F (39.3 C)  Recent Labs  Lab 12/25/19 0209 12/25/19 0218 12/26/19 0530  WBC  --  7.5 11.4*  CREATININE 1.41*  --  1.29*    Estimated Creatinine Clearance: 42.7 mL/min (A) (by C-G formula based on SCr of 1.29 mg/dL (H)).    Allergies  Allergen Reactions  . Tramadol   . Oxycodone Nausea And Vomiting    Thank you for allowing pharmacy to be a part of this patient's care.  Dolly Rias RPh 12/26/2019, 1:18 PM

## 2019-12-26 NOTE — Telephone Encounter (Signed)
Call received from [facility] to inform Dr. Marin Olp that pt is currently admitted in room 1405 with diverticulitis.  Dr. Marin Olp notified.

## 2019-12-26 NOTE — Progress Notes (Signed)
Subjective: Feeling better.  Abdominal pain is improving as well as diarrhea.  Objective: Vital signs in last 24 hours: Temp:  [98.6 F (37 C)-101.7 F (38.7 C)] 99.6 F (37.6 C) (06/22 0454) Pulse Rate:  [96-106] 101 (06/22 0454) Resp:  [14-37] 20 (06/22 0454) BP: (106-149)/(57-104) 135/78 (06/22 0454) SpO2:  [90 %-95 %] 94 % (06/22 0454) Weight:  [101.8 kg] 101.8 kg (06/21 2304) Last BM Date: 12/25/19  Intake/Output from previous day: 06/21 0701 - 06/22 0700 In: 3131.7 [I.V.:1960; IV Piggyback:1171.7] Out: 400 [Urine:400] Intake/Output this shift: No intake/output data recorded.  General appearance: alert and no distress GI: tender on the right side, no rebound  Lab Results: Recent Labs    12/25/19 0218 12/26/19 0530  WBC 7.5 11.4*  HGB 12.0 11.4*  HCT 37.8 35.6*  PLT 197 161   BMET Recent Labs    12/25/19 0209 12/26/19 0530  NA 141 138  K 4.8 4.3  CL 102 107  CO2 27 20*  GLUCOSE 139* 102*  BUN 27* 24*  CREATININE 1.41* 1.29*  CALCIUM 9.1 8.8*   LFT Recent Labs    12/25/19 0209  PROT 6.9  ALBUMIN 3.7  AST 29  ALT 23  ALKPHOS 55  BILITOT 0.6   PT/INR No results for input(s): LABPROT, INR in the last 72 hours. Hepatitis Panel No results for input(s): HEPBSAG, HCVAB, HEPAIGM, HEPBIGM in the last 72 hours. C-Diff Recent Labs    12/26/19 1246  CDIFFTOX NEGATIVE   Fecal Lactopherrin No results for input(s): FECLLACTOFRN in the last 72 hours.  Studies/Results: CT ABDOMEN PELVIS W CONTRAST  Result Date: 12/25/2019 CLINICAL DATA:  Lower GI bleeding and diarrhea EXAM: CT ABDOMEN AND PELVIS WITH CONTRAST TECHNIQUE: Multidetector CT imaging of the abdomen and pelvis was performed using the standard protocol following bolus administration of intravenous contrast. CONTRAST:  9m OMNIPAQUE IOHEXOL 300 MG/ML  SOLN COMPARISON:  August 17, 2017 FINDINGS: Lower chest: The visualized heart size within normal limits. No pericardial fluid/thickening. No  hiatal hernia. Posterior bibasilar ground-glass atelectasis is noted. Hepatobiliary: The liver is normal in density without focal abnormality.The main portal vein is patent. No evidence of calcified gallstones, gallbladder wall thickening or biliary dilatation. Pancreas: Unremarkable. No pancreatic ductal dilatation or surrounding inflammatory changes. Spleen: Normal in size without focal abnormality. Adrenals/Urinary Tract: Both adrenal glands appear normal. The kidneys and collecting system appear normal without evidence of urinary tract calculus or hydronephrosis. Bladder is unremarkable. Stomach/Bowel: The stomach and small bowel are normal in appearance. There is diffuse wall thickening with diverticula and surrounding inflammatory changes involving the cecum and terminal ileum. Scattered small lymph nodes are seen within the right lower quadrant. No surrounding loculated fluid collections or free air. The appendix appears to be mildly prominent measuring 7 mm in transverse dimension, however there is small foci of air seen within the appendix. No surrounding inflammatory changes seen around the appendiceal tip. Vascular/Lymphatic: There are no enlarged mesenteric, retroperitoneal, or pelvic lymph nodes. No significant vascular findings are present. Reproductive: The patient is status post hysterectomy. No adnexal masses or collections seen. Other: No evidence of abdominal wall mass or hernia. Musculoskeletal: No acute or significant osseous findings. IMPRESSION: Findings consistent with diffuse diverticulitis/enteritis involving the cecum and terminal ileum with significant surrounding inflammatory changes. No loculated fluid collections or free air. Mildly prominent appendix, however no definite evidence of appendicitis. Electronically Signed   By: BPrudencio PairM.D.   On: 12/25/2019 04:38    Medications:  Scheduled: . vitamin  C  1,000 mg Oral Daily  . cholecalciferol  2,000 Units Oral Daily  .  estradiol  1 mg Oral Daily  . folic acid  2 mg Oral Daily  . gabapentin  300 mg Oral 2 times per day   And  . gabapentin  600 mg Oral QHS  . leflunomide  20 mg Oral Daily  . methocarbamol  750 mg Oral BID  . multivitamin with minerals  1 tablet Oral Daily  . pantoprazole  40 mg Oral Daily  . predniSONE  10 mg Oral Daily  . vitamin B-12  1,000 mcg Oral Daily   Continuous: . dextrose 5% lactated ringers 100 mL/hr at 12/26/19 1349  . piperacillin-tazobactam (ZOSYN)  IV 3.375 g (12/26/19 1325)    Assessment/Plan: 1) Cecal diverticulitis. 2) ABD pain.   Clinically she is improving.  She had a difficult night last night with diarrhea and her WBC did increase, but she is feeling better now.  C diff is negative.  Stool pathogen panel is still pending.  Plan: 1) Continue with Zosyn. 2) Eat as she can tolerate.  LOS: 1 day   Helen Hardy D 12/26/2019, 3:15 PM

## 2019-12-26 NOTE — Progress Notes (Signed)
Initial Nutrition Assessment  RD working remotely.  DOCUMENTATION CODES:   Morbid obesity  INTERVENTION:  - will order [employer] once/day, each supplement provides 455 kcal and 20 grams protein. - will order 30 mL Prostat BID, each supplement provides 100 kcal and 15 grams of protein.  NUTRITION DIAGNOSIS:   Increased nutrient needs related to acute illness as evidenced by estimated needs.  GOAL:   Patient will meet greater than or equal to 90% of their needs  MONITOR:   PO intake, Supplement acceptance, Labs, Weight trends  REASON FOR ASSESSMENT:   Consult Assessment of nutrition requirement/status  ASSESSMENT:   69 year old female with a medical history for non-Hodgkin's lymphoma, (diagnosis 2004, now in remission), rheumatoid arthritis, HTN, GERD, osteoarthritis, obesity, and lumbar radiculopathy. Patient presented to the ED with acute onset of severe abdominal pain with associated severe and profuse bloody diarrhea. She also had generalized malaise and weakness x2 days prior to the onset of abdominal pain. No nausea or vomiting. CT abdomen/pelvis showed diffuse diverticulitis and enteritis involving the cecum and terminal ileum with significant inflammatory changes.  Diet advanced from NPO to Regular today at 1300 and no intakes documented since that time. Notes indicate that patient reported significant decrease in PO intake PTA d/t abdominal pain and diarrhea; no N/V during that time.   Per chart review, weight yesterday was 224 lb and weight on 6/6 was 233 lb. This indicates 9 lb weight loss (4% body weight) in ~2 weeks; significant for time frame. Suspect may also be partly d/t fluid loss with diarrhea PTA. Will monitor weight trends.  Per notes: - acute cecal diverticulitis and ileitis--rectal tube with watery output - cdiff testing and GI panel pending - AKI; metabolic acidosis - chronic back pain and bilateral knee osteoarthritis    Labs reviewed; BUN: 24 mg/dl,  creatinine: 1.29 mg/dl, GFR: 49 ml/min. Medications reviewed; 1000 mcg ascorbic acid/day, 2000 units vitamin D/day, 2 mg folvite/day, 1 tablet multivitamin with minerals/day, 10 mg deltasone/day, 1000 mcg oral cyanocobalamin/day.  IVF; D5-LR @ 100 ml/hr (408 kcal).     NUTRITION - FOCUSED PHYSICAL EXAM:  unable to complete at this time.   Diet Order:   Diet Order            Diet regular Room service appropriate? Yes; Fluid consistency: Thin  Diet effective now                 EDUCATION NEEDS:   No education needs have been identified at this time  Skin:  Skin Assessment: Reviewed RN Assessment  Last BM:  6/22  Height:   Ht Readings from Last 1 Encounters:  12/25/19 4' 11"  (1.499 m)    Weight:   Wt Readings from Last 1 Encounters:  12/25/19 101.8 kg    Estimated Nutritional Needs:  Kcal:  1730-1935 kcal Protein:  90-105 grams Fluid:  >/= 2 L/day     Jarome Matin, MS, RD, LDN, CNSC Inpatient Clinical Dietitian RD pager # available in [facility]  After hours/weekend pager # available in [facility]

## 2019-12-26 NOTE — Progress Notes (Signed)
PROGRESS NOTE    Helen Hardy  SVX:[id number] DOB: [birth year] DOA: 12/25/2019 PCP: Deland Pretty, MD    Brief Narrative:  Patient admitted to the hospital with the working diagnosis of acute cecal diverticulitis and terminal ileitis.  69 year old female with a past medical history for non-Hodgkin's lymphoma, (diagnosis 2004, now in remission), rheumatoid arthritis, hypertension, GERD, osteoarthritis, obesity class III and lumbar radiculopathy.    Patient reported acute onset of severe abdominal pain, diffuse, predominantly at the right quadrants, associated with severe and profuse bloody diarrhea.  Her symptoms were preceded by generalized malaise and weakness for 2 days.  No nausea and no vomiting, but significant decreased p.o. intake.  On her initial physical examination blood pressure 106/50, heart rate 94-107, respiratory rate 23, oxygen saturation 95%, temperature 99.7.  Her lungs were clear to auscultation bilaterally, heart S1-S2, present, rhythmic, abdomen tender mainly at the right side, no rebound or guarding, no lower extremity edema. Sodium 141, potassium 4.8, chloride 102, bicarb 27, glucose 139, BUN 27, creatinine 1.41, white count 7.5, hemoglobin 12.0, hematocrit 37.9, platelets 197.  SARS COVID-19 negative.  CT of the abdomen and pelvis with diffuse diverticulitis, enteritis involving the cecum and terminal ileum with significant surrounding inflammatory changes.  Patient has been placed on IV fluids and IV antibiotic therapy. Ordered C diff and GI panel.   Assessment & Plan:   Principal Problem:   Cecal diverticulitis Active Problems:   Morbid obesity (HCC)   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[place])   Rheumatoid arthritis (HCC)   GERD (gastroesophageal reflux disease)   Essential hypertension   AKI (acute kidney injury) ([place])   Hyperglycemia   Chronic radicular lumbar pain   Generalized weakness   1. Acute diverticulitis/ enteritis affecting the cecum and terminal ileum. Patient  continue to have profuse diarrhea, has a rectal tube in place with watery output. Wbc is worsening up to 11,4 and continue to have abdominal pain, no nausea or vomiting.   Will continue antibiotic therapy with IV Zosyn (hold on ciprofloxacin and metronidazole), will continue IV fluids with balanced electrolyte solutions and will follow up on C diff testing and stool GI panel.   Continue as needed IV morphine for pain control. Advance diet to regular and consult nutrition for supplements.   2. AKI with non anion gap metabolic acidosis. Pre-renal renal injury, serum cr today at 1,29 with K at 4,3 and serum bicarbonate at 20, anion gap 11.   Will continue hydration with balanced electrolyte solutions with dextrose at 100 ml per H. Will follow up renal panel in am, avoid hypotension and nephrotoxic medications.   3. RA/ chronic back pain/ bilateral knee osteoarthritis. Continue pain control with methocarbamol, hydrocodone, leflunomide and gabapentin.   At home on chronic prednisone therapy, will continue current dose, no current indication for stress dose at this point.   4. GERD. Continue with pantoprazole.   5. Obesity class 3. Calculated BMI is 45,3, will need outpatient follow up.   6. HTN. Blood pressure this am 135/78, will continue close monitoring.   Resolved hyperglycemia, likely reactive.   Patient continue to be at high risk for worsening colitis and diarrhea.   Status is: Inpatient  Remains inpatient appropriate because:IV treatments appropriate due to intensity of illness or inability to take PO   Dispo: The patient is from: Home              Anticipated d/c is to: Home  Anticipated d/c date is: 3 days              Patient currently is not medically stable to d/c.   DVT prophylaxis: Enoxaparin   Code Status:   full  Family Communication:  No family the bedside      Consultants:   GI    Antimicrobials:   Zosyn     Subjective: Patient continue to  have abdominal pain, predominantly at the right side, improved with IV morphine but not yet back to baseline,. No nausea or vomiting, poor appetite,   Objective: Vitals:   12/25/19 2229 12/25/19 2303 12/25/19 2304 12/26/19 0454  BP: (!) 149/90 (!) 116/104  135/78  Pulse: (!) 106 98  (!) 101  Resp: (!) 27 (!) 24  20  Temp:  99.6 F (37.6 C)  99.6 F (37.6 C)  TempSrc:  Oral  Oral  SpO2: 92% 95%  94%  Weight:   101.8 kg   Height:   4' 11"  (1.499 m)     Intake/Output Summary (Last 24 hours) at 12/26/2019 1147 Last data filed at 12/26/2019 0534 Gross per 24 hour  Intake 2465 ml  Output 400 ml  Net 2065 ml   Filed Weights   12/25/19 0200 12/25/19 2304  Weight: 105.7 kg 101.8 kg    Examination:   General: Not in pain or dyspnea, deconditioned  Neurology: Awake and alert, non focal  E ENT: mild pallor, no icterus, oral mucosa moist Cardiovascular: No JVD. S1-S2 present, rhythmic, no gallops, rubs, or murmurs. No lower extremity edema. Pulmonary: positive breath sounds bilaterally, no wheezing, rhonchi or rales. Gastrointestinal. Abdomen distended and tender to deep palpation at the right side with no rebound or guarding.  Skin. No rashes Musculoskeletal: hypertrophic knees bilaterally.      Data Reviewed: I have personally reviewed following labs and imaging studies  CBC: Recent Labs  Lab 12/25/19 0218 12/26/19 0530  WBC 7.5 11.4*  NEUTROABS 5.0 8.0*  HGB 12.0 11.4*  HCT 37.8 35.6*  MCV 111.2* 112.3*  PLT 197 007   Basic Metabolic Panel: Recent Labs  Lab 12/25/19 0209 12/26/19 0530  NA 141 138  K 4.8 4.3  CL 102 107  CO2 27 20*  GLUCOSE 139* 102*  BUN 27* 24*  CREATININE 1.41* 1.29*  CALCIUM 9.1 8.8*   GFR: Estimated Creatinine Clearance: 42.7 mL/min (A) (by C-G formula based on SCr of 1.29 mg/dL (H)). Liver Function Tests: Recent Labs  Lab 12/25/19 0209  AST 29  ALT 23  ALKPHOS 55  BILITOT 0.6  PROT 6.9  ALBUMIN 3.7   Recent Labs  Lab  12/25/19 0209  LIPASE 19   No results for input(s): AMMONIA in the last 168 hours. Coagulation Profile: No results for input(s): INR, PROTIME in the last 168 hours. Cardiac Enzymes: No results for input(s): CKTOTAL, CKMB, CKMBINDEX, TROPONINI in the last 168 hours. BNP (last 3 results) No results for input(s): PROBNP in the last 8760 hours. HbA1C: No results for input(s): HGBA1C in the last 72 hours. CBG: No results for input(s): GLUCAP in the last 168 hours. Lipid Profile: No results for input(s): CHOL, HDL, LDLCALC, TRIG, CHOLHDL, LDLDIRECT in the last 72 hours. Thyroid Function Tests: No results for input(s): TSH, T4TOTAL, FREET4, T3FREE, THYROIDAB in the last 72 hours. Anemia Panel: No results for input(s): VITAMINB12, FOLATE, FERRITIN, TIBC, IRON, RETICCTPCT in the last 72 hours.    Radiology Studies: I have reviewed all of the imaging during this hospital visit personally  Scheduled Meds: . vitamin C  1,000 mg Oral Daily  . cholecalciferol  2,000 Units Oral Daily  . estradiol  1 mg Oral Daily  . folic acid  2 mg Oral Daily  . gabapentin  300 mg Oral 2 times per day   And  . gabapentin  600 mg Oral QHS  . leflunomide  20 mg Oral Daily  . methocarbamol  750 mg Oral BID  . multivitamin with minerals  1 tablet Oral Daily  . pantoprazole  40 mg Oral Daily  . predniSONE  10 mg Oral Daily  . vitamin B-12  1,000 mcg Oral Daily   Continuous Infusions: . sodium chloride 100 mL/hr at 12/26/19 0200  . ciprofloxacin 400 mg (12/26/19 0456)  . metronidazole 500 mg (12/26/19 0620)     LOS: 1 day        Brysen Shankman Gerome Apley, MD

## 2019-12-27 ENCOUNTER — Inpatient Hospital Stay: Payer: Medicare Other

## 2019-12-27 ENCOUNTER — Inpatient Hospital Stay: Payer: Medicare Other | Admitting: Family

## 2019-12-27 DIAGNOSIS — A044 Other intestinal Escherichia coli infections: Secondary | ICD-10-CM

## 2019-12-27 LAB — CBC WITH DIFFERENTIAL/PLATELET
Abs Immature Granulocytes: 0.17 10*3/uL — ABNORMAL HIGH (ref 0.00–0.07)
Basophils Absolute: 0 10*3/uL (ref 0.0–0.1)
Basophils Relative: 0 %
Eosinophils Absolute: 0.1 10*3/uL (ref 0.0–0.5)
Eosinophils Relative: 1 %
HCT: 31.9 % — ABNORMAL LOW (ref 36.0–46.0)
Hemoglobin: 10.3 g/dL — ABNORMAL LOW (ref 12.0–15.0)
Immature Granulocytes: 2 %
Lymphocytes Relative: 19 %
Lymphs Abs: 1.8 10*3/uL (ref 0.7–4.0)
MCH: 35.6 pg — ABNORMAL HIGH (ref 26.0–34.0)
MCHC: 32.3 g/dL (ref 30.0–36.0)
MCV: 110.4 fL — ABNORMAL HIGH (ref 80.0–100.0)
Monocytes Absolute: 0.5 10*3/uL (ref 0.1–1.0)
Monocytes Relative: 6 %
Neutro Abs: 6.7 10*3/uL (ref 1.7–7.7)
Neutrophils Relative %: 72 %
Platelets: 143 10*3/uL — ABNORMAL LOW (ref 150–400)
RBC: 2.89 MIL/uL — ABNORMAL LOW (ref 3.87–5.11)
RDW: 16.7 % — ABNORMAL HIGH (ref 11.5–15.5)
WBC: 9.2 10*3/uL (ref 4.0–10.5)
nRBC: 0 % (ref 0.0–0.2)

## 2019-12-27 LAB — HEMOGLOBIN A1C
Hgb A1c MFr Bld: 5.7 % — ABNORMAL HIGH (ref 4.8–5.6)
Mean Plasma Glucose: 117 mg/dL

## 2019-12-27 LAB — GASTROINTESTINAL PANEL BY PCR, STOOL (REPLACES STOOL CULTURE)

## 2019-12-27 LAB — BASIC METABOLIC PANEL
Anion gap: 8 (ref 5–15)
BUN: 20 mg/dL (ref 8–23)
CO2: 21 mmol/L — ABNORMAL LOW (ref 22–32)
Calcium: 8.3 mg/dL — ABNORMAL LOW (ref 8.9–10.3)
Chloride: 110 mmol/L (ref 98–111)
Creatinine, Ser: 1.26 mg/dL — ABNORMAL HIGH (ref 0.44–1.00)
GFR calc Af Amer: 50 mL/min — ABNORMAL LOW (ref >60)
GFR calc non Af Amer: 43 mL/min — ABNORMAL LOW (ref >60)
Glucose, Bld: 112 mg/dL — ABNORMAL HIGH (ref 70–99)
Potassium: 3.8 mmol/L (ref 3.5–5.1)
Sodium: 139 mmol/L (ref 135–145)

## 2019-12-27 LAB — MAGNESIUM: Magnesium: 2.2 mg/dL (ref 1.7–2.4)

## 2019-12-27 MED ORDER — SODIUM CHLORIDE 0.9 % IV SOLN
500.0000 mg | INTRAVENOUS | Status: AC
  Administered 2019-12-27 – 2019-12-29 (×3): 500 mg via INTRAVENOUS
  Filled 2019-12-27 (×3): qty 500

## 2019-12-27 NOTE — Progress Notes (Signed)
PROGRESS NOTE    Helen Hardy  WLN:[id number] DOB: 02-16-1951 DOA: 12/25/2019 PCP: Deland Pretty, MD    Brief Narrative:  69 year old female with a past medical history for non-Hodgkin's lymphoma, (diagnosis 2004, now in remission), rheumatoid arthritis, hypertension, GERD, osteoarthritis, obesity class III and lumbar radiculopathy, presented with acute onset of severe abdominal pain, diffuse, predominantly at the right quadrants, associated with severe and profuse bloody diarrhea.  Her symptoms were preceded by generalized malaise and weakness for 2 days.  No nausea and no vomiting, but significant decreased p.o. intake. Labs showed  BUN 27, creatinine 1.41, white count 7.5, hemoglobin 12.0, hematocrit 37.9, platelets 197.  SARS COVID-19 negative.  CT of the abdomen and pelvis with diffuse diverticulitis, enteritis involving the cecum and terminal ileum with significant surrounding inflammatory changes.  Patient admitted for further management.    Assessment & Plan:   Principal Problem:   Cecal diverticulitis Active Problems:   Morbid obesity (HCC)   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[place])   Rheumatoid arthritis ([place])   GERD (gastroesophageal reflux disease)   Essential hypertension   AKI (acute kidney injury) (HCC)   Hyperglycemia   Chronic radicular lumbar pain   Generalized weakness   Acute E.coli gastroenteritis  Currently afebrile, with resolving leukocytosis CT abdomen/pelvis showed diffuse diverticulitis/enteritis involving the cecum and terminal ileum C. difficile negative, GI panel showed enteropathogenic E. Coli GI on board, appreciate recs Discontinue IV Zosyn, start IV azithromycin daily for 3 days Continue IV fluids Continue as needed IV morphine for pain control  Advance diet to regular and consult nutrition for supplements Continue rectal tube Monitor closely  AKI with non anion gap metabolic acidosis Continue IV fluids Daily BMP   RA/ chronic back pain/ bilateral knee  osteoarthritis Continue pain control with methocarbamol, hydrocodone, leflunomide and gabapentin, continue home prednisone for now  GERD Continue with pantoprazole  Hypertension Monitor closely  Morbid obesity BMI 45.32 Lifestyle modification advised    Status is: Inpatient  Remains inpatient appropriate because:IV treatments appropriate due to intensity of illness or inability to take PO   Dispo: The patient is from: Home              Anticipated d/c is to: Home              Anticipated d/c date is: 2 days              Patient currently is not medically stable to d/c.   DVT prophylaxis: Enoxaparin   Code Status:   full  Family Communication:  Plan to discuss with family, no family the bedside      Consultants:   GI    Antimicrobials:   Zosyn--> azithromycin   Subjective: Patient continues to report abdominal pain, worse on the right side, still with diarrhea, denies any nausea/vomiting, but is afraid to eat.  Patient encouraged.     Objective: Vitals:   12/26/19 1544 12/26/19 2058 12/27/19 0427 12/27/19 1223  BP: 91/76 124/85 121/78 129/74  Pulse: 97 87 75 86  Resp: 17 16 18 20   Temp: 98.2 F (36.8 C) 98.7 F (37.1 C) 97.9 F (36.6 C) 98.3 F (36.8 C)  TempSrc: Oral Oral Oral Oral  SpO2: 100% 100% 97% 97%  Weight:      Height:        Intake/Output Summary (Last 24 hours) at 12/27/2019 1700 Last data filed at 12/27/2019 1523 Gross per 24 hour  Intake 2665.78 ml  Output 1700 ml  Net 965.78 ml  Filed Weights   12/25/19 0200 12/25/19 2304  Weight: 105.7 kg 101.8 kg    Examination:   General: NAD   Cardiovascular: S1, S2 present  Respiratory: CTAB  Abdomen: Soft, right-sided tenderness, obese, bowel sounds present  Musculoskeletal: No bilateral pedal edema noted  Skin: Normal  Psychiatry: Normal mood     Data Reviewed: I have personally reviewed following labs and imaging studies  CBC: Recent Labs  Lab 12/25/19 0218  12/26/19 0530 12/27/19 0617  WBC 7.5 11.4* 9.2  NEUTROABS 5.0 8.0* 6.7  HGB 12.0 11.4* 10.3*  HCT 37.8 35.6* 31.9*  MCV 111.2* 112.3* 110.4*  PLT 197 161 161*   Basic Metabolic Panel: Recent Labs  Lab 12/25/19 0209 12/26/19 0530 12/27/19 0617  NA 141 138 139  K 4.8 4.3 3.8  CL 102 107 110  CO2 27 20* 21*  GLUCOSE 139* 102* 112*  BUN 27* 24* 20  CREATININE 1.41* 1.29* 1.26*  CALCIUM 9.1 8.8* 8.3*  MG  --   --  2.2   GFR: Estimated Creatinine Clearance: 43.7 mL/min (A) (by C-G formula based on SCr of 1.26 mg/dL (H)). Liver Function Tests: Recent Labs  Lab 12/25/19 0209  AST 29  ALT 23  ALKPHOS 55  BILITOT 0.6  PROT 6.9  ALBUMIN 3.7   Recent Labs  Lab 12/25/19 0209  LIPASE 19   No results for input(s): AMMONIA in the last 168 hours. Coagulation Profile: No results for input(s): INR, PROTIME in the last 168 hours. Cardiac Enzymes: No results for input(s): CKTOTAL, CKMB, CKMBINDEX, TROPONINI in the last 168 hours. BNP (last 3 results) No results for input(s): PROBNP in the last 8760 hours. HbA1C: Recent Labs    12/26/19 0530  HGBA1C 5.7*   CBG: No results for input(s): GLUCAP in the last 168 hours. Lipid Profile: No results for input(s): CHOL, HDL, LDLCALC, TRIG, CHOLHDL, LDLDIRECT in the last 72 hours. Thyroid Function Tests: No results for input(s): TSH, T4TOTAL, FREET4, T3FREE, THYROIDAB in the last 72 hours. Anemia Panel: No results for input(s): VITAMINB12, FOLATE, FERRITIN, TIBC, IRON, RETICCTPCT in the last 72 hours.    Radiology Studies: I have reviewed all of the imaging during this hospital visit personally     Scheduled Meds: . vitamin C  1,000 mg Oral Daily  . cholecalciferol  2,000 Units Oral Daily  . estradiol  1 mg Oral Daily  . feeding supplement ([employer] STANDARD 1.4)  325 mL Oral Q24H  . feeding supplement (PRO-STAT SUGAR FREE 64)  30 mL Oral BID BM  . folic acid  2 mg Oral Daily  . gabapentin  300 mg Oral 2 times per  day   And  . gabapentin  600 mg Oral QHS  . leflunomide  20 mg Oral Daily  . methocarbamol  750 mg Oral BID  . multivitamin with minerals  1 tablet Oral Daily  . pantoprazole  40 mg Oral Daily  . predniSONE  10 mg Oral Daily  . vitamin B-12  1,000 mcg Oral Daily   Continuous Infusions: . dextrose 5% lactated ringers 100 mL/hr at 12/27/19 1207  . piperacillin-tazobactam (ZOSYN)  IV 3.375 g (12/27/19 1415)     LOS: 2 days        Alma Friendly, MD

## 2019-12-27 NOTE — Progress Notes (Signed)
GASTROENTEROLOGY ROUNDING NOTE Subjective: [name] is a [age] year old black female with right lower quadrant pain admitted couple of days ago and diagnosed with enteropathogenic E. coli infection involving the distal ileum and the ascending colon currently doing better on azithromycin.  She was initially treated with Zosyn. The right lower quadrant pain has improved significantly but she still continues to have profuse diarrhea with over 1000 cc's output today.  She is tolerating a full liquid diet well.  She denies having any fever, nausea, chills, melena or hematochezia.  Objective: Vital signs in last 24 hours: Temp:  [97.9 F (36.6 C)-98.7 F (37.1 C)] 97.9 F (36.6 C) (06/23 0427) Pulse Rate:  [75-97] 75 (06/23 0427) Resp:  [16-18] 18 (06/23 0427) BP: (91-124)/(76-85) 121/78 (06/23 0427) SpO2:  [97 %-100 %] 97 % (06/23 0427) Last BM Date: 12/26/19 General: NAD Lungs: Clear to auscultation with no rales rhonchi wheezing Heart: S1-S2 regular with no murmur rub or gallop  Abdomen: Morbidly obese with right lower quadrant tenderness on palpation with guarding no rebound or rigidity  Intake/Output from previous day: 06/22 0701 - 06/23 0700 In: 1904 [I.V.:1804; IV Piggyback:100] Out: 1850 [Urine:1450; Stool:400] Intake/Output this shift: Total I/O In: 600 [Other:600] Out: -   Lab Results: Recent Labs    12/25/19 0218 12/26/19 0530 12/27/19 0617  WBC 7.5 11.4* 9.2  HGB 12.0 11.4* 10.3*  PLT 197 161 143*  MCV 111.2* 112.3* 110.4*   BMET Recent Labs    12/25/19 0209 12/26/19 0530 12/27/19 0617  NA 141 138 139  K 4.8 4.3 3.8  CL 102 107 110  CO2 27 20* 21*  GLUCOSE 139* 102* 112*  BUN 27* 24* 20  CREATININE 1.41* 1.29* 1.26*  CALCIUM 9.1 8.8* 8.3*   LFT Recent Labs    12/25/19 0209  PROT 6.9  ALBUMIN 3.7  AST 29  ALT 23  ALKPHOS 55  BILITOT 0.6   PT/INR No results for input(s): INR in the last 72 hours.  Imaging/Other results: No results  found.  Assessment /Plan 1) Acute E. coli gastroenteritis currently doing well on azithromycin.  Her hemoglobin is dropped slightly from 12 g/dL to 10.3 g/dL today but she currently does not have any ongoing evidence of GI bleeding. The white count is improved to 9.2K today.  We will continue present antibiotics and IV hydration and monitor her closely. 2) Pandiverticulosis. 3) GERD-on Protonix. 4) Rheumatoid arthritis/lumbar radiculopathy/chronic back pain. 5) HTN. 6) AKI-seems to be improving slowly-creatinine is 1.26 today 7) Morbid obesity.  Juanita Craver, MD  12/27/2019, 11:00 AM

## 2019-12-28 LAB — CBC WITH DIFFERENTIAL/PLATELET
Abs Immature Granulocytes: 0.04 10*3/uL (ref 0.00–0.07)
Basophils Absolute: 0 10*3/uL (ref 0.0–0.1)
Basophils Relative: 0 %
Eosinophils Absolute: 0 10*3/uL (ref 0.0–0.5)
Eosinophils Relative: 1 %
HCT: 29.6 % — ABNORMAL LOW (ref 36.0–46.0)
Hemoglobin: 9.6 g/dL — ABNORMAL LOW (ref 12.0–15.0)
Immature Granulocytes: 1 %
Lymphocytes Relative: 24 %
Lymphs Abs: 1.6 10*3/uL (ref 0.7–4.0)
MCH: 35.8 pg — ABNORMAL HIGH (ref 26.0–34.0)
MCHC: 32.4 g/dL (ref 30.0–36.0)
MCV: 110.4 fL — ABNORMAL HIGH (ref 80.0–100.0)
Monocytes Absolute: 0.5 10*3/uL (ref 0.1–1.0)
Monocytes Relative: 8 %
Neutro Abs: 4.3 10*3/uL (ref 1.7–7.7)
Neutrophils Relative %: 66 %
Platelets: 137 10*3/uL — ABNORMAL LOW (ref 150–400)
RBC: 2.68 MIL/uL — ABNORMAL LOW (ref 3.87–5.11)
RDW: 16.2 % — ABNORMAL HIGH (ref 11.5–15.5)
WBC: 6.5 10*3/uL (ref 4.0–10.5)
nRBC: 0 % (ref 0.0–0.2)

## 2019-12-28 LAB — BASIC METABOLIC PANEL
Anion gap: 7 (ref 5–15)
BUN: 19 mg/dL (ref 8–23)
CO2: 21 mmol/L — ABNORMAL LOW (ref 22–32)
Calcium: 8.3 mg/dL — ABNORMAL LOW (ref 8.9–10.3)
Chloride: 113 mmol/L — ABNORMAL HIGH (ref 98–111)
Creatinine, Ser: 0.92 mg/dL (ref 0.44–1.00)
GFR calc Af Amer: >60 mL/min (ref >60)
GFR calc non Af Amer: >60 mL/min (ref >60)
Glucose, Bld: 98 mg/dL (ref 70–99)
Potassium: 3.7 mmol/L (ref 3.5–5.1)
Sodium: 141 mmol/L (ref 135–145)

## 2019-12-28 NOTE — Care Management Important Message (Signed)
Important Message  Patient Details IM Letter given to Dessa Phi RN Case Manager to present to the Patient Name: Helen Hardy MRN: [id number] Date of Birth: [birth year]   Medicare Important Message Given:  Yes     Kerin Salen 12/28/2019, 12:11 PM

## 2019-12-28 NOTE — Plan of Care (Signed)
  Problem: Education: Goal: Knowledge of General Education information will improve Description Including pain rating scale, medication(s)/side effects and non-pharmacologic comfort measures Outcome: Progressing   Problem: Health Behavior/Discharge Planning: Goal: Ability to manage health-related needs will improve Outcome: Progressing   

## 2019-12-28 NOTE — Plan of Care (Signed)
  Problem: Education: Goal: Knowledge of General Education information will improve Description Including pain rating scale, medication(s)/side effects and non-pharmacologic comfort measures Outcome: Progressing   

## 2019-12-28 NOTE — Progress Notes (Signed)
Assumed care of the pt from off going RN. Conditon stable. Cont with plan of care

## 2019-12-28 NOTE — Progress Notes (Signed)
Subjective: She continues to improve, but slowly.  Objective: Vital signs in last 24 hours: Temp:  [98.1 F (36.7 C)-98.4 F (36.9 C)] 98.1 F (36.7 C) (06/24 0502) Pulse Rate:  [70-86] 70 (06/24 0502) Resp:  [18-20] 18 (06/24 0502) BP: (129-138)/(64-74) 138/73 (06/24 0502) SpO2:  [97 %-100 %] 100 % (06/24 0502) Last BM Date: 12/27/19  Intake/Output from previous day: 06/23 0701 - 06/24 0700 In: 2836.3 [P.O.:360; I.V.:1708.5; IV Piggyback:167.9] Out: 1450 [Urine:1450] Intake/Output this shift: No intake/output data recorded.  General appearance: alert and no distress GI: soft, non-tender; bowel sounds normal; no masses,  no organomegaly  Lab Results: Recent Labs    12/26/19 0530 12/27/19 0617 12/28/19 0538  WBC 11.4* 9.2 6.5  HGB 11.4* 10.3* 9.6*  HCT 35.6* 31.9* 29.6*  PLT 161 143* 137*   BMET Recent Labs    12/26/19 0530 12/27/19 0617 12/28/19 0538  NA 138 139 141  K 4.3 3.8 3.7  CL 107 110 113*  CO2 20* 21* 21*  GLUCOSE 102* 112* 98  BUN 24* 20 19  CREATININE 1.29* 1.26* 0.92  CALCIUM 8.8* 8.3* 8.3*   LFT No results for input(s): PROT, ALBUMIN, AST, ALT, ALKPHOS, BILITOT, BILIDIR, IBILI in the last 72 hours. PT/INR No results for input(s): LABPROT, INR in the last 72 hours. Hepatitis Panel No results for input(s): HEPBSAG, HCVAB, HEPAIGM, HEPBIGM in the last 72 hours. C-Diff Recent Labs    12/26/19 1246  CDIFFTOX NEGATIVE   Fecal Lactopherrin No results for input(s): FECLLACTOFRN in the last 72 hours.  Studies/Results: No results found.  Medications:  Scheduled: . vitamin C  1,000 mg Oral Daily  . cholecalciferol  2,000 Units Oral Daily  . estradiol  1 mg Oral Daily  . feeding supplement (KATE FARMS STANDARD 1.4)  325 mL Oral Q24H  . feeding supplement (PRO-STAT SUGAR FREE 64)  30 mL Oral BID BM  . folic acid  2 mg Oral Daily  . gabapentin  300 mg Oral 2 times per day   And  . gabapentin  600 mg Oral QHS  . leflunomide  20 mg Oral  Daily  . methocarbamol  750 mg Oral BID  . multivitamin with minerals  1 tablet Oral Daily  . pantoprazole  40 mg Oral Daily  . predniSONE  10 mg Oral Daily  . vitamin B-12  1,000 mcg Oral Daily   Continuous: . azithromycin 500 mg (12/27/19 1741)  . dextrose 5% lactated ringers Stopped (12/28/19 0553)    Assessment/Plan: 1) Enteropathogenic E coli. 2) Diarrhea. 3) Abdominal pain.   The right sided abdominal pain continues to persist.  She thinks that her stool has decreased.  It is only reported that she had two bouts of stool, but the actual amount was not recorded.  Examination of the stool back shows that it is liquid stool, but not a significant amount, currently.  Plan: 1) Continue with azithromycin. 2) Continue with supportive care.  LOS: 3 days   Olander Friedl D 12/28/2019, 7:07 AM

## 2019-12-28 NOTE — Progress Notes (Signed)
PROGRESS NOTE    Helen Hardy  ZES:[id number] DOB: [birth year] DOA: 12/25/2019 PCP: Deland Pretty, MD    Brief Narrative:  69 year old female with a past medical history for non-Hodgkin's lymphoma, (diagnosis 2004, now in remission), rheumatoid arthritis, hypertension, GERD, osteoarthritis, obesity class III and lumbar radiculopathy, presented with acute onset of severe abdominal pain, diffuse, predominantly at the right quadrants, associated with severe and profuse bloody diarrhea.  Her symptoms were preceded by generalized malaise and weakness for 2 days.  No nausea and no vomiting, but significant decreased p.o. intake. Labs showed  BUN 27, creatinine 1.41, white count 7.5, hemoglobin 12.0, hematocrit 37.9, platelets 197.  SARS COVID-19 negative.  CT of the abdomen and pelvis with diffuse diverticulitis, enteritis involving the cecum and terminal ileum with significant surrounding inflammatory changes.  Patient admitted for further management.    Assessment & Plan:   Principal Problem:   Cecal diverticulitis Active Problems:   Morbid obesity (HCC)   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[place])   Rheumatoid arthritis ([place])   GERD (gastroesophageal reflux disease)   Essential hypertension   AKI (acute kidney injury) (HCC)   Hyperglycemia   Chronic radicular lumbar pain   Generalized weakness   Acute E.coli gastroenteritis  Currently afebrile, with resolved leukocytosis CT abdomen/pelvis showed diffuse diverticulitis/enteritis involving the cecum and terminal ileum C. difficile negative, GI panel showed enteropathogenic E. Coli GI on board, appreciate recs Discontinue IV Zosyn, continue IV azithromycin daily for 3 days Continue IV fluids Continue as needed IV morphine for pain control  Advance diet to regular and consult nutrition for supplements Continue rectal tube Monitor closely  AKI with non anion gap metabolic acidosis Improving Continue IV fluids Daily BMP   RA/ chronic back pain/  bilateral knee osteoarthritis Continue pain control with methocarbamol, hydrocodone, leflunomide and gabapentin, continue home prednisone for now  Anemia of chronic disease Baseline hemoglobin around 10 Daily CBC  GERD Continue with pantoprazole  Hypertension Monitor closely  Morbid obesity BMI 45.32 Lifestyle modification advised    Status is: Inpatient  Remains inpatient appropriate because:IV treatments appropriate due to intensity of illness or inability to take PO   Dispo: The patient is from: Home              Anticipated d/c is to: Home              Anticipated d/c date is: 2 days              Patient currently is not medically stable to d/c.   DVT prophylaxis: Enoxaparin   Code Status:   full  Family Communication:  Discussed extensively with patient, no family the bedside      Consultants:   GI    Antimicrobials:   Zosyn--> azithromycin   Subjective: Patient continues to report abdominal pain, denies any nausea/vomiting, continues to have liquid stools, reports some improvement.    Objective: Vitals:   12/27/19 1223 12/27/19 2106 12/28/19 0502 12/28/19 1230  BP: 129/74 131/64 138/73 (!) 151/82  Pulse: 86 78 70 74  Resp: 20 18 18 20   Temp: 98.3 F (36.8 C) 98.4 F (36.9 C) 98.1 F (36.7 C) 98 F (36.7 C)  TempSrc: Oral Oral Oral Oral  SpO2: 97% 100% 100% 98%  Weight:      Height:        Intake/Output Summary (Last 24 hours) at 12/28/2019 1730 Last data filed at 12/28/2019 1507 Gross per 24 hour  Intake 2476.57 ml  Output 1200 ml  Net 1276.57  ml   Filed Weights   12/25/19 0200 12/25/19 2304  Weight: 105.7 kg 101.8 kg    Examination:   General: NAD   Cardiovascular: S1, S2 present  Respiratory: CTAB  Abdomen: Soft, right-sided tenderness, obese, bowel sounds present  Musculoskeletal: No bilateral pedal edema noted  Skin: Normal  Psychiatry: Normal mood     Data Reviewed: I have personally reviewed following labs and  imaging studies  CBC: Recent Labs  Lab 12/25/19 0218 12/26/19 0530 12/27/19 0617 12/28/19 0538  WBC 7.5 11.4* 9.2 6.5  NEUTROABS 5.0 8.0* 6.7 4.3  HGB 12.0 11.4* 10.3* 9.6*  HCT 37.8 35.6* 31.9* 29.6*  MCV 111.2* 112.3* 110.4* 110.4*  PLT 197 161 143* 035*   Basic Metabolic Panel: Recent Labs  Lab 12/25/19 0209 12/26/19 0530 12/27/19 0617 12/28/19 0538  NA 141 138 139 141  K 4.8 4.3 3.8 3.7  CL 102 107 110 113*  CO2 27 20* 21* 21*  GLUCOSE 139* 102* 112* 98  BUN 27* 24* 20 19  CREATININE 1.41* 1.29* 1.26* 0.92  CALCIUM 9.1 8.8* 8.3* 8.3*  MG  --   --  2.2  --    GFR: Estimated Creatinine Clearance: 59.8 mL/min (by C-G formula based on SCr of 0.92 mg/dL). Liver Function Tests: Recent Labs  Lab 12/25/19 0209  AST 29  ALT 23  ALKPHOS 55  BILITOT 0.6  PROT 6.9  ALBUMIN 3.7   Recent Labs  Lab 12/25/19 0209  LIPASE 19   No results for input(s): AMMONIA in the last 168 hours. Coagulation Profile: No results for input(s): INR, PROTIME in the last 168 hours. Cardiac Enzymes: No results for input(s): CKTOTAL, CKMB, CKMBINDEX, TROPONINI in the last 168 hours. BNP (last 3 results) No results for input(s): PROBNP in the last 8760 hours. HbA1C: Recent Labs    12/26/19 0530  HGBA1C 5.7*   CBG: No results for input(s): GLUCAP in the last 168 hours. Lipid Profile: No results for input(s): CHOL, HDL, LDLCALC, TRIG, CHOLHDL, LDLDIRECT in the last 72 hours. Thyroid Function Tests: No results for input(s): TSH, T4TOTAL, FREET4, T3FREE, THYROIDAB in the last 72 hours. Anemia Panel: No results for input(s): VITAMINB12, FOLATE, FERRITIN, TIBC, IRON, RETICCTPCT in the last 72 hours.    Radiology Studies: I have reviewed all of the imaging during this hospital visit personally     Scheduled Meds: . vitamin C  1,000 mg Oral Daily  . cholecalciferol  2,000 Units Oral Daily  . estradiol  1 mg Oral Daily  . feeding supplement (KATE FARMS STANDARD 1.4)  325 mL  Oral Q24H  . feeding supplement (PRO-STAT SUGAR FREE 64)  30 mL Oral BID BM  . folic acid  2 mg Oral Daily  . gabapentin  300 mg Oral 2 times per day   And  . gabapentin  600 mg Oral QHS  . leflunomide  20 mg Oral Daily  . methocarbamol  750 mg Oral BID  . multivitamin with minerals  1 tablet Oral Daily  . pantoprazole  40 mg Oral Daily  . predniSONE  10 mg Oral Daily  . vitamin B-12  1,000 mcg Oral Daily   Continuous Infusions: . azithromycin 500 mg (12/28/19 1712)  . dextrose 5% lactated ringers 75 mL/hr at 12/28/19 0738     LOS: 3 days        Alma Friendly, MD

## 2019-12-29 LAB — CBC WITH DIFFERENTIAL/PLATELET
Abs Immature Granulocytes: 0.04 10*3/uL (ref 0.00–0.07)
Basophils Absolute: 0 10*3/uL (ref 0.0–0.1)
Basophils Relative: 0 %
Eosinophils Absolute: 0 10*3/uL (ref 0.0–0.5)
Eosinophils Relative: 0 %
HCT: 29.4 % — ABNORMAL LOW (ref 36.0–46.0)
Hemoglobin: 9.5 g/dL — ABNORMAL LOW (ref 12.0–15.0)
Immature Granulocytes: 1 %
Lymphocytes Relative: 33 %
Lymphs Abs: 1.7 10*3/uL (ref 0.7–4.0)
MCH: 35.7 pg — ABNORMAL HIGH (ref 26.0–34.0)
MCHC: 32.3 g/dL (ref 30.0–36.0)
MCV: 110.5 fL — ABNORMAL HIGH (ref 80.0–100.0)
Monocytes Absolute: 0.5 10*3/uL (ref 0.1–1.0)
Monocytes Relative: 9 %
Neutro Abs: 3 10*3/uL (ref 1.7–7.7)
Neutrophils Relative %: 57 %
Platelets: 133 10*3/uL — ABNORMAL LOW (ref 150–400)
RBC: 2.66 MIL/uL — ABNORMAL LOW (ref 3.87–5.11)
RDW: 15.9 % — ABNORMAL HIGH (ref 11.5–15.5)
WBC: 5.3 10*3/uL (ref 4.0–10.5)
nRBC: 0 % (ref 0.0–0.2)

## 2019-12-29 LAB — BASIC METABOLIC PANEL
Anion gap: 7 (ref 5–15)
BUN: 17 mg/dL (ref 8–23)
CO2: 22 mmol/L (ref 22–32)
Calcium: 8.2 mg/dL — ABNORMAL LOW (ref 8.9–10.3)
Chloride: 113 mmol/L — ABNORMAL HIGH (ref 98–111)
Creatinine, Ser: 0.8 mg/dL (ref 0.44–1.00)
GFR calc Af Amer: >60 mL/min (ref >60)
GFR calc non Af Amer: >60 mL/min (ref >60)
Glucose, Bld: 104 mg/dL — ABNORMAL HIGH (ref 70–99)
Potassium: 3.6 mmol/L (ref 3.5–5.1)
Sodium: 142 mmol/L (ref 135–145)

## 2019-12-29 NOTE — Progress Notes (Signed)
Report received from [facility], [place].  Assessment unchanged. Andre Lefort

## 2019-12-29 NOTE — Evaluation (Addendum)
Physical Therapy Evaluation Patient Details Name: Helen Hardy MRN: [id number] DOB: [birth year] Today's Date: 12/29/2019   History of Present Illness  69 y.o. female with medical history significant of non-Hodgkin's lymphoma (dx in 2004) in remission, rheumatoid arthritis since age 5, chronic pain, hypertension, GERD, osteoarthritis, lumbar radiculopathy, morbid obesity, anemia of chronic disease presented to the ED with abdominal pain and bloody diarrhea. Dx of cecal diverticulitis, AKI.  Clinical Impression  Pt admitted with above diagnosis. Mod assist for bed mobility, mod assist of 2 for stand pivot transfer to recliner with RW, buckling of BLEs x 1 during transfer. At present, pt's spouse cannot provide needed level of assistance. If mobility doesn't improve significantly, she will need rehab at [facility] upon acute DC.  At baseline, she ambulates independently with a RW.  Pt currently with functional limitations due to the deficits listed below (see PT Problem List). Pt will benefit from skilled PT to increase their independence and safety with mobility to allow discharge to the venue listed below.       Follow Up Recommendations SNF; assist for mobility    Equipment Recommendations  3in1 (PT)    Recommendations for Other Services       Precautions / Restrictions Precautions Precautions: Fall Precaution Comments: denies h/o falls in past 1 year Restrictions Weight Bearing Restrictions: No      Mobility  Bed Mobility Overal bed mobility: Needs Assistance Bed Mobility: Rolling;Sidelying to Sit Rolling: Min assist Sidelying to sit: Mod assist       General bed mobility comments: assist to initiate roll, then to raise trunk, pt reported dizziness in sitting, BP 155/97, HR 80 in sitting  Transfers Overall transfer level: Needs assistance Equipment used: Rolling walker (2 wheeled) Transfers: Sit to/from [facility] Sit to Stand: Mod assist;+2 physical  assistance;+2 safety/equipment Stand pivot transfers: Mod assist;+2 safety/equipment;+2 physical assistance       General transfer comment: assist to rise/steady, BLEs buckled during pivot requiring +2 mod A to prevent loss of balance, VCs for positioning in RW  Ambulation/Gait                Stairs            Wheelchair Mobility    Modified Rankin (Stroke Patients Only)       Balance Overall balance assessment: Needs assistance   Sitting balance-Leahy Scale: Fair       Standing balance-Leahy Scale: Poor                               Pertinent Vitals/Pain Pain Assessment: Faces Faces Pain Scale: Hurts even more Pain Location: R lower abdomen Pain Descriptors / Indicators: Grimacing;Guarding Pain Intervention(s): Limited activity within patient's tolerance;Monitored during session;Premedicated before session    Home Living Family/patient expects to be discharged to:: Private residence Living Arrangements: Spouse/significant other Available Help at Discharge: Family;Available 24 hours/day Type of Home: House Home Access: Stairs to enter   [employer] of Steps: 1 Home Layout: One level Home Equipment: Walker - 2 wheels;Cane - single point;Shower seat Additional Comments: denies falls in past 1 year    Prior Function Level of Independence: Independent with assistive device(s)         Comments: walks with RW or SPC at baseline, sits on shower seat to bathe     Hand Dominance        Extremity/Trunk Assessment   Upper Extremity Assessment Upper Extremity Assessment: RUE  deficits/detail RUE Deficits / Details: very limited shoulder flexion AROM (~25*), limited by pain, will place OT order    Lower Extremity Assessment Lower Extremity Assessment: Overall WFL for tasks assessed (h/o B TKA, knee ext +4/5 B, pt reports h/o peripheral neuropathy but sensation intact to light touch)    Cervical / Trunk Assessment Cervical /  Trunk Assessment: Normal  Communication   Communication: No difficulties  Cognition Arousal/Alertness: Awake/alert Behavior During Therapy: WFL for tasks assessed/performed Overall Cognitive Status: Within Functional Limits for tasks assessed                                        General Comments      Exercises General Exercises - Lower Extremity Ankle Circles/Pumps: AROM;Both;10 reps;Seated   Assessment/Plan    PT Assessment Patient needs continued PT services  PT Problem List Decreased mobility;Decreased activity tolerance;Decreased balance;Pain       PT Treatment Interventions Gait training;Functional mobility training;Therapeutic activities;Therapeutic exercise;Balance training;Patient/family education    PT Goals (Current goals can be found in the Care Plan section)  Acute Rehab PT Goals Patient Stated Goal: return to walking independently with RW PT Goal Formulation: With patient Time For Goal Achievement: 01/12/20    Frequency Min 2X/week   Barriers to discharge        Co-evaluation               AM-PAC PT "6 Clicks" Mobility  Outcome Measure Help needed turning from your back to your side while in a flat bed without using bedrails?: A Little Help needed moving from lying on your back to sitting on the side of a flat bed without using bedrails?: A Lot Help needed moving to and from a bed to a chair (including a wheelchair)?: A Lot Help needed standing up from a chair using your arms (e.g., wheelchair or bedside chair)?: A Lot Help needed to walk in hospital room?: Total Help needed climbing 3-5 steps with a railing? : Total 6 Click Score: 11    End of Session Equipment Utilized During Treatment: Gait belt Activity Tolerance: Patient limited by fatigue;Patient tolerated treatment well Patient left: in chair;with call bell/phone within reach;with nursing/sitter in room Nurse Communication: Mobility status PT Visit Diagnosis: Difficulty  in walking, not elsewhere classified (R26.2);Unsteadiness on feet (R26.81);Pain    Time: 6381-7711 PT Time Calculation (min) (ACUTE ONLY): 25 min   Charges:   PT Evaluation $PT Eval Low Complexity: 1 Low PT Treatments $Therapeutic Activity: 8-22 mins        Blondell Reveal Kistler PT 12/29/2019  Acute Rehabilitation Services Pager [phone] Office [phone]

## 2019-12-29 NOTE — Progress Notes (Signed)
PROGRESS NOTE    Helen Hardy  DXI:[id number] DOB: 26-Apr-1951 DOA: 12/25/2019 PCP: Deland Pretty, MD    Brief Narrative:  69 year old female with a past medical history for non-Hodgkin's lymphoma, (diagnosis 2004, now in remission), rheumatoid arthritis, hypertension, GERD, osteoarthritis, obesity class III and lumbar radiculopathy, presented with acute onset of severe abdominal pain, diffuse, predominantly at the right quadrants, associated with severe and profuse bloody diarrhea.  Her symptoms were preceded by generalized malaise and weakness for 2 days.  No nausea and no vomiting, but significant decreased p.o. intake. Labs showed  BUN 27, creatinine 1.41, white count 7.5, hemoglobin 12.0, hematocrit 37.9, platelets 197.  SARS COVID-19 negative.  CT of the abdomen and pelvis with diffuse diverticulitis, enteritis involving the cecum and terminal ileum with significant surrounding inflammatory changes.  Patient admitted for further management.    Assessment & Plan:   Principal Problem:   Cecal diverticulitis Active Problems:   Morbid obesity (HCC)   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[place])   Rheumatoid arthritis ([place])   GERD (gastroesophageal reflux disease)   Essential hypertension   AKI (acute kidney injury) (HCC)   Hyperglycemia   Chronic radicular lumbar pain   Generalized weakness   Acute E.coli gastroenteritis  Currently afebrile, with resolved leukocytosis CT abdomen/pelvis showed diffuse diverticulitis/enteritis involving the cecum and terminal ileum C. difficile negative, GI panel showed enteropathogenic E. Coli GI on board, appreciate recs S/P IV Zosyn, continue IV azithromycin daily for 3 days D/C IV fluids Continue as needed IV morphine for pain control  Advance diet to regular and consult nutrition for supplements Discontinue rectal tube Monitor closely  AKI with non anion gap metabolic acidosis Resolved Discontinue IV fluids  RA/ chronic back pain/ bilateral knee  osteoarthritis Continue pain control with methocarbamol, hydrocodone, leflunomide and gabapentin, continue home prednisone for now  Anemia of chronic disease Baseline hemoglobin around 10 Daily CBC  GERD Continue with pantoprazole  Hypertension Monitor closely  Morbid obesity BMI 45.32 Lifestyle modification advised    Status is: Inpatient  Remains inpatient appropriate because:IV treatments appropriate due to intensity of illness or inability to take PO   Dispo: The patient is from: Home              Anticipated d/c is to: SNF              Anticipated d/c date is: 1 day              Patient currently is not medically stable to d/c.   DVT prophylaxis: Enoxaparin   Code Status:   full  Family Communication:  Discussed extensively with patient, no family the bedside      Consultants:   GI    Antimicrobials:   Zosyn--> azithromycin   Subjective: Reports improving abdominal pain, diarrhea has slowed down, able to remove rectal tube, denies any chest pain, shortness of breath, fever/chills.  May require SNF upon discharge as patient has advanced rheumatoid arthritis and is somewhat deconditioned.    Objective: Vitals:   12/28/19 0502 12/28/19 1230 12/28/19 2023 12/29/19 1148  BP: 138/73 (!) 151/82 133/76 (!) 164/84  Pulse: 70 74 78 72  Resp: 18 20 18 20   Temp: 98.1 F (36.7 C) 98 F (36.7 C) 98.1 F (36.7 C) 98 F (36.7 C)  TempSrc: Oral Oral Oral Oral  SpO2: 100% 98% 98% 100%  Weight:      Height:        Intake/Output Summary (Last 24 hours) at 12/29/2019 1608 Last data filed  at 12/29/2019 1551 Gross per 24 hour  Intake 1402.42 ml  Output 1300 ml  Net 102.42 ml   Filed Weights   12/25/19 0200 12/25/19 2304  Weight: 105.7 kg 101.8 kg    Examination:   General: NAD, deconditioned  Cardiovascular: S1, S2 present  Respiratory: CTAB  Abdomen: Soft, NT, obese, bowel sounds present  Musculoskeletal: No bilateral pedal edema noted, noted  arthritic changes of extremities  Skin: Normal  Psychiatry: Normal mood     Data Reviewed: I have personally reviewed following labs and imaging studies  CBC: Recent Labs  Lab 12/25/19 0218 12/26/19 0530 12/27/19 0617 12/28/19 0538 12/29/19 0518  WBC 7.5 11.4* 9.2 6.5 5.3  NEUTROABS 5.0 8.0* 6.7 4.3 3.0  HGB 12.0 11.4* 10.3* 9.6* 9.5*  HCT 37.8 35.6* 31.9* 29.6* 29.4*  MCV 111.2* 112.3* 110.4* 110.4* 110.5*  PLT 197 161 143* 137* 229*   Basic Metabolic Panel: Recent Labs  Lab 12/25/19 0209 12/26/19 0530 12/27/19 0617 12/28/19 0538 12/29/19 0518  NA 141 138 139 141 142  K 4.8 4.3 3.8 3.7 3.6  CL 102 107 110 113* 113*  CO2 27 20* 21* 21* 22  GLUCOSE 139* 102* 112* 98 104*  BUN 27* 24* 20 19 17   CREATININE 1.41* 1.29* 1.26* 0.92 0.80  CALCIUM 9.1 8.8* 8.3* 8.3* 8.2*  MG  --   --  2.2  --   --    GFR: Estimated Creatinine Clearance: 68.8 mL/min (by C-G formula based on SCr of 0.8 mg/dL). Liver Function Tests: Recent Labs  Lab 12/25/19 0209  AST 29  ALT 23  ALKPHOS 55  BILITOT 0.6  PROT 6.9  ALBUMIN 3.7   Recent Labs  Lab 12/25/19 0209  LIPASE 19   No results for input(s): AMMONIA in the last 168 hours. Coagulation Profile: No results for input(s): INR, PROTIME in the last 168 hours. Cardiac Enzymes: No results for input(s): CKTOTAL, CKMB, CKMBINDEX, TROPONINI in the last 168 hours. BNP (last 3 results) No results for input(s): PROBNP in the last 8760 hours. HbA1C: No results for input(s): HGBA1C in the last 72 hours. CBG: No results for input(s): GLUCAP in the last 168 hours. Lipid Profile: No results for input(s): CHOL, HDL, LDLCALC, TRIG, CHOLHDL, LDLDIRECT in the last 72 hours. Thyroid Function Tests: No results for input(s): TSH, T4TOTAL, FREET4, T3FREE, THYROIDAB in the last 72 hours. Anemia Panel: No results for input(s): VITAMINB12, FOLATE, FERRITIN, TIBC, IRON, RETICCTPCT in the last 72 hours.    Radiology Studies: I have reviewed  all of the imaging during this hospital visit personally     Scheduled Meds: . vitamin C  1,000 mg Oral Daily  . cholecalciferol  2,000 Units Oral Daily  . estradiol  1 mg Oral Daily  . feeding supplement (KATE FARMS STANDARD 1.4)  325 mL Oral Q24H  . feeding supplement (PRO-STAT SUGAR FREE 64)  30 mL Oral BID BM  . folic acid  2 mg Oral Daily  . gabapentin  300 mg Oral 2 times per day   And  . gabapentin  600 mg Oral QHS  . leflunomide  20 mg Oral Daily  . methocarbamol  750 mg Oral BID  . multivitamin with minerals  1 tablet Oral Daily  . pantoprazole  40 mg Oral Daily  . predniSONE  10 mg Oral Daily  . vitamin B-12  1,000 mcg Oral Daily   Continuous Infusions: . azithromycin 500 mg (12/28/19 1712)  . dextrose 5% lactated ringers 50 mL/hr  at 12/29/19 1221     LOS: 4 days        Alma Friendly, MD

## 2019-12-29 NOTE — Progress Notes (Signed)
Subjective: Feeling better.  Objective: Vital signs in last 24 hours: Temp:  [98 F (36.7 C)-98.1 F (36.7 C)] 98.1 F (36.7 C) (06/24 2023) Pulse Rate:  [74-78] 78 (06/24 2023) Resp:  [18-20] 18 (06/24 2023) BP: (133-151)/(76-82) 133/76 (06/24 2023) SpO2:  [98 %] 98 % (06/24 2023) Last BM Date: 12/27/19  Intake/Output from previous day: 06/24 0701 - 06/25 0700 In: 1963 [P.O.:480; I.V.:1183] Out: 1050 [Urine:1050] Intake/Output this shift: Total I/O In: -  Out: 250 [Urine:250]  General appearance: alert and no distress GI: decreased right sided tenderness  Lab Results: Recent Labs    12/27/19 0617 12/28/19 0538 12/29/19 0518  WBC 9.2 6.5 5.3  HGB 10.3* 9.6* 9.5*  HCT 31.9* 29.6* 29.4*  PLT 143* 137* 133*   BMET Recent Labs    12/27/19 0617 12/28/19 0538 12/29/19 0518  NA 139 141 142  K 3.8 3.7 3.6  CL 110 113* 113*  CO2 21* 21* 22  GLUCOSE 112* 98 104*  BUN 20 19 17   CREATININE 1.26* 0.92 0.80  CALCIUM 8.3* 8.3* 8.2*   LFT No results for input(s): PROT, ALBUMIN, AST, ALT, ALKPHOS, BILITOT, BILIDIR, IBILI in the last 72 hours. PT/INR No results for input(s): LABPROT, INR in the last 72 hours. Hepatitis Panel No results for input(s): HEPBSAG, HCVAB, HEPAIGM, HEPBIGM in the last 72 hours. C-Diff Recent Labs    12/26/19 1246  CDIFFTOX NEGATIVE   Fecal Lactopherrin No results for input(s): FECLLACTOFRN in the last 72 hours.  Studies/Results: No results found.  Medications:  Scheduled: . vitamin C  1,000 mg Oral Daily  . cholecalciferol  2,000 Units Oral Daily  . estradiol  1 mg Oral Daily  . feeding supplement (KATE FARMS STANDARD 1.4)  325 mL Oral Q24H  . feeding supplement (PRO-STAT SUGAR FREE 64)  30 mL Oral BID BM  . folic acid  2 mg Oral Daily  . gabapentin  300 mg Oral 2 times per day   And  . gabapentin  600 mg Oral QHS  . leflunomide  20 mg Oral Daily  . methocarbamol  750 mg Oral BID  . multivitamin with minerals  1 tablet Oral  Daily  . pantoprazole  40 mg Oral Daily  . predniSONE  10 mg Oral Daily  . vitamin B-12  1,000 mcg Oral Daily   Continuous: . azithromycin 500 mg (12/28/19 1712)  . dextrose 5% lactated ringers 50 mL/hr at 12/29/19 0200    Assessment/Plan: 1) Enteropathogenic E coli. 2) Abdominal pain. 3) Diarrhea.   She continues to improve.  Her stooling exhibits some form.  It is not pure liquid and there was only a minimal amount in the rectal bag.  It was documented that she only had one bowel movement.  Her abdominal pain also improved.  Plan: 1) Continue azithromycin. 2) ? D/C home tomorrow or Sunday.  LOS: 4 days   Chapin Arduini D 12/29/2019, 8:39 AM

## 2019-12-30 LAB — CBC WITH DIFFERENTIAL/PLATELET
Abs Immature Granulocytes: 0.04 10*3/uL (ref 0.00–0.07)
Basophils Absolute: 0 10*3/uL (ref 0.0–0.1)
Basophils Relative: 0 %
Eosinophils Absolute: 0.1 10*3/uL (ref 0.0–0.5)
Eosinophils Relative: 1 %
HCT: 27.7 % — ABNORMAL LOW (ref 36.0–46.0)
Hemoglobin: 9 g/dL — ABNORMAL LOW (ref 12.0–15.0)
Immature Granulocytes: 1 %
Lymphocytes Relative: 37 %
Lymphs Abs: 1.8 10*3/uL (ref 0.7–4.0)
MCH: 35.3 pg — ABNORMAL HIGH (ref 26.0–34.0)
MCHC: 32.5 g/dL (ref 30.0–36.0)
MCV: 108.6 fL — ABNORMAL HIGH (ref 80.0–100.0)
Monocytes Absolute: 0.6 10*3/uL (ref 0.1–1.0)
Monocytes Relative: 12 %
Neutro Abs: 2.3 10*3/uL (ref 1.7–7.7)
Neutrophils Relative %: 49 %
Platelets: 133 10*3/uL — ABNORMAL LOW (ref 150–400)
RBC: 2.55 MIL/uL — ABNORMAL LOW (ref 3.87–5.11)
RDW: 15.4 % (ref 11.5–15.5)
WBC: 4.8 10*3/uL (ref 4.0–10.5)
nRBC: 0 % (ref 0.0–0.2)

## 2019-12-30 LAB — BASIC METABOLIC PANEL
Anion gap: 8 (ref 5–15)
BUN: 14 mg/dL (ref 8–23)
CO2: 21 mmol/L — ABNORMAL LOW (ref 22–32)
Calcium: 8.1 mg/dL — ABNORMAL LOW (ref 8.9–10.3)
Chloride: 113 mmol/L — ABNORMAL HIGH (ref 98–111)
Creatinine, Ser: 0.79 mg/dL (ref 0.44–1.00)
GFR calc Af Amer: >60 mL/min (ref >60)
GFR calc non Af Amer: >60 mL/min (ref >60)
Glucose, Bld: 86 mg/dL (ref 70–99)
Potassium: 3.4 mmol/L — ABNORMAL LOW (ref 3.5–5.1)
Sodium: 142 mmol/L (ref 135–145)

## 2019-12-30 LAB — GLUCOSE, CAPILLARY: Glucose-Capillary: 123 mg/dL — ABNORMAL HIGH (ref 70–99)

## 2019-12-30 MED ORDER — IRBESARTAN 150 MG PO TABS: 75.0000 mg | ORAL_TABLET | Freq: Every day | ORAL | Status: DC

## 2019-12-30 MED ORDER — POTASSIUM CHLORIDE CRYS ER 20 MEQ PO TBCR
40.0000 meq | EXTENDED_RELEASE_TABLET | Freq: Once | ORAL | Status: AC
  Administered 2019-12-30: 40 meq via ORAL
  Filled 2019-12-30: qty 2

## 2019-12-30 MED ORDER — IRBESARTAN 150 MG PO TABS
150.0000 mg | ORAL_TABLET | Freq: Every day | ORAL | Status: DC
  Administered 2019-12-30 – 2019-12-31 (×2): 150 mg via ORAL
  Filled 2019-12-30 (×2): qty 1

## 2019-12-30 MED ORDER — FUROSEMIDE 40 MG PO TABS
40.0000 mg | ORAL_TABLET | Freq: Every day | ORAL | Status: DC
  Administered 2019-12-30 – 2019-12-31 (×2): 40 mg via ORAL
  Filled 2019-12-30 (×2): qty 1

## 2019-12-30 NOTE — Progress Notes (Signed)
Subjective: No GI complaints.  Objective: Vital signs in last 24 hours: Temp:  [98 F (36.7 C)-99.1 F (37.3 C)] 99.1 F (37.3 C) (06/26 0558) Pulse Rate:  [72-80] 80 (06/26 0558) Resp:  [17-20] 17 (06/26 0558) BP: (152-164)/(71-84) 152/82 (06/26 0558) SpO2:  [100 %] 100 % (06/26 0558) Last BM Date: 12/28/19  Intake/Output from previous day: 06/25 0701 - 06/26 0700 In: 1330.9 [P.O.:360; I.V.:789.9; IV Piggyback:181] Out: 700 [Urine:700] Intake/Output this shift: No intake/output data recorded.  General appearance: alert and no distress GI: minimal right sided abdominal tenderness.  Lab Results: Recent Labs    12/28/19 0538 12/29/19 0518 12/30/19 0527  WBC 6.5 5.3 4.8  HGB 9.6* 9.5* 9.0*  HCT 29.6* 29.4* 27.7*  PLT 137* 133* 133*   BMET Recent Labs    12/28/19 0538 12/29/19 0518 12/30/19 0527  NA 141 142 142  K 3.7 3.6 3.4*  CL 113* 113* 113*  CO2 21* 22 21*  GLUCOSE 98 104* 86  BUN 19 17 14   CREATININE 0.92 0.80 0.79  CALCIUM 8.3* 8.2* 8.1*   LFT No results for input(s): PROT, ALBUMIN, AST, ALT, ALKPHOS, BILITOT, BILIDIR, IBILI in the last 72 hours. PT/INR No results for input(s): LABPROT, INR in the last 72 hours. Hepatitis Panel No results for input(s): HEPBSAG, HCVAB, HEPAIGM, HEPBIGM in the last 72 hours. C-Diff No results for input(s): CDIFFTOX in the last 72 hours. Fecal Lactopherrin No results for input(s): FECLLACTOFRN in the last 72 hours.  Studies/Results: No results found.  Medications:  Scheduled: . vitamin C  1,000 mg Oral Daily  . cholecalciferol  2,000 Units Oral Daily  . estradiol  1 mg Oral Daily  . feeding supplement (KATE FARMS STANDARD 1.4)  325 mL Oral Q24H  . feeding supplement (PRO-STAT SUGAR FREE 64)  30 mL Oral BID BM  . folic acid  2 mg Oral Daily  . gabapentin  300 mg Oral 2 times per day   And  . gabapentin  600 mg Oral QHS  . leflunomide  20 mg Oral Daily  . methocarbamol  750 mg Oral BID  . multivitamin with  minerals  1 tablet Oral Daily  . pantoprazole  40 mg Oral Daily  . predniSONE  10 mg Oral Daily  . vitamin B-12  1,000 mcg Oral Daily   Continuous:   Assessment/Plan: 1) Enteropathic E coli. 2) ABD pain - improved.   From the GI standpoint she is well.  Her diarrhea has essentially stopped as well as her abdominal pain.  No further GI intervention  Plan: 1) Office follow up in 2 weeks. 2) Signing off.   LOS: 5 days   Shwanda Soltis D 12/30/2019, 9:07 AM

## 2019-12-30 NOTE — TOC Initial Note (Signed)
Transition of Care [facility]) - Initial/Assessment Note    Patient Details  Name: Helen Hardy MRN: [id number] Date of Birth: [birth year]  [facility]) CM/SW Contact:    Lennart Pall, LCSW Phone Number: 12/30/2019, 1:51 PM  Clinical Narrative:  Met with pt and spouse today to discuss possible SNF need.  Both very pleasant and state they would like to avoid SNF if she can.  Spouse reports he is there most of the time except when working in evenings ~ 5 - 9pm.  Daughter-in-law in the room and states that she lives close by and could be available via phone call when spouse at work.  Pt reports that she doesn't feel like the therapists were with her long enough to get a good idea of her mobility and is hopeful they will see her again today to further assess.  States, "I mean, if they feel like I have to do that (SNF) then I will but I'd like to have another time with them."   Pt has several DME items already at home due to prior ortho surgeries.  She is interested in [facility] f/u.  Will reach out to therapy to see if they can see her today or tomorrow and see if SNF might be avoided.  Will continue to follow.                 Expected Discharge Plan: [place] (vs short term SNF) Barriers to Discharge: Continued Medical Work up   Patient Goals and CMS Choice Patient states their goals for this hospitalization and ongoing recovery are:: to go home      Expected Discharge Plan and Services Expected Discharge Plan: [place] (vs short term SNF) In-house Referral: Clinical Social Work     Living arrangements for the past 2 months: [place]                                      Prior Living Arrangements/Services Living arrangements for the past 2 months: Single Family Home Lives with:: Spouse Patient language and need for interpreter reviewed:: Yes Do you feel safe going back to the place where you live?: Yes      Need for Family  Participation in Patient Care: Yes (Comment) Care giver support system in place?: Yes (comment) Current home services: DME Criminal Activity/Legal Involvement Pertinent to Current Situation/Hospitalization: No - Comment as needed  Activities of Daily Living Home Assistive Devices/Equipment: Cane (specify quad or straight), Walker (specify type), Eyeglasses (reading glasses) ADL Screening (condition at time of admission) Patient's cognitive ability adequate to safely complete daily activities?: Yes Is the patient deaf or have difficulty hearing?: No Does the patient have difficulty seeing, even when wearing glasses/contacts?: No Does the patient have difficulty concentrating, remembering, or making decisions?: No Patient able to express need for assistance with ADLs?: Yes Does the patient have difficulty dressing or bathing?: No Independently performs ADLs?: Yes (appropriate for developmental age) Does the patient have difficulty walking or climbing stairs?: Yes Weakness of Legs: Both Weakness of Arms/Hands: Both  Permission Sought/Granted Permission sought to share information with : Family Supports    Share Information with NAME: Rogelio Seen     Permission granted to share info w Relationship: spouse  Permission granted to share info w Contact Information: [phone]  Emotional Assessment Appearance:: Appears stated age Attitude/Demeanor/Rapport: Engaged, Gracious Affect (  typically observed): Accepting Orientation: : Oriented to Self, Oriented to Place, Oriented to  Time, Oriented to Situation Alcohol / Substance Use: Not Applicable Psych Involvement: No (comment)  Admission diagnosis:  Lower GI bleed [K92.2] Diverticulitis of cecum [K57.32] Macrocytosis without anemia [D75.89] AKI (acute kidney injury) ([place]) [N17.9] Acute diverticulitis [K57.92] Patient Active Problem List   Diagnosis Date Noted  . 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 Cecal diverticulitis 12/25/2019   . Rheumatoid arthritis ([place]) 12/25/2019  . GERD (gastroesophageal reflux disease) 12/25/2019  . Essential hypertension 12/25/2019  . AKI (acute kidney injury) ([place]) 12/25/2019  . Hyperglycemia 12/25/2019  . Chronic radicular lumbar pain 12/25/2019  . Generalized weakness 12/25/2019  . Anemia of chronic renal failure, stage 3 (moderate) 12/30/2018  . 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 Iron deficiency anemia 06/21/2015  . 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 Morbid obesity ([place]) 04/10/2015  . 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 S/P knee replacement 04/08/2015   PCP:  Deland Pretty, MD Pharmacy:   [facility] [address] [place]), [place] - [address] [place] [address] Phone: [phone] Fax: [phone]     Social Determinants of Health (SDOH) Interventions    Readmission Risk Interventions No flowsheet data found.

## 2019-12-30 NOTE — Evaluation (Signed)
Occupational Therapy Evaluation Patient Details Name: Helen Hardy MRN: [id number] DOB: September 03, 1950 Today's Date: 12/30/2019    History of Present Illness 70 y.o. female with medical history significant of non-Hodgkin's lymphoma (dx in 2004) in remission, rheumatoid arthritis since age 79, chronic pain, hypertension, GERD, osteoarthritis, lumbar radiculopathy, morbid obesity, anemia of chronic disease presented to the ED with abdominal pain and bloody diarrhea. Dx of cecal diverticulitis, AKI.   Clinical Impression   Pt admitted with the above. Pt currently with functional limitations due to the deficits listed below (see OT Problem List).  Pt will benefit from skilled OT to increase their safety and independence with ADL and functional mobility for ADL to facilitate discharge to venue listed below.      Follow Up Recommendations  Home health OT;Supervision/Assistance - 24 hour    Equipment Recommendations  3 in 1 bedside commode       Precautions / Restrictions Precautions Precautions: Fall Precaution Comments: denies h/o falls in past 1 year      Mobility Bed Mobility Overal bed mobility: Needs Assistance Bed Mobility: Supine to Sit Rolling: Min assist            Transfers Overall transfer level: Needs assistance Equipment used: Rolling walker (2 wheeled) Transfers: Sit to/from [facility] Sit to Stand: Min assist Stand pivot transfers: Min assist            Balance Overall balance assessment: Needs assistance   Sitting balance-Leahy Scale: Fair       Standing balance-Leahy Scale: Poor                             ADL either performed or assessed with clinical judgement   ADL Overall ADL's : Needs assistance/impaired Eating/Feeding: Set up;Sitting   Grooming: Set up;Sitting   Upper Body Bathing: Minimal assistance;Sitting   Lower Body Bathing: Maximal assistance;Sit to/from stand;Cueing for safety;Cueing for  compensatory techniques   Upper Body Dressing : Minimal assistance;Sitting   Lower Body Dressing: Maximal assistance;Sit to/from stand   Toilet Transfer: Minimal assistance;RW;Stand-pivot;Cueing for safety;Cueing for sequencing   Toileting- Clothing Manipulation and Hygiene: Maximal assistance;Sit to/from stand;Cueing for safety;Cueing for compensatory techniques;Cueing for sequencing         General ADL Comments: pt will need A with ADL activity at home.  Family aware and will work on a plan.     Vision Patient Visual Report: No change from baseline              Pertinent Vitals/Pain Pain Score: 3  Pain Location: R lower abdomen Pain Descriptors / Indicators: Sore Pain Intervention(s): Limited activity within patient's tolerance     Hand Dominance     Extremity/Trunk Assessment Upper Extremity Assessment RUE Deficits / Details: very limited shoulder flexion AROM (~25*), limited by pain       Cervical / Trunk Assessment Cervical / Trunk Assessment: Normal   Communication Communication Communication: No difficulties   Cognition Arousal/Alertness: Awake/alert Behavior During Therapy: WFL for tasks assessed/performed Overall Cognitive Status: Within Functional Limits for tasks assessed                                                Home Living Family/patient expects to be discharged to:: Private residence Living Arrangements: Spouse/significant other Available Help at Discharge: Family;Available 24 hours/day Type  of Home: House Home Access: Stairs to enter [employer] of Steps: 1   Home Layout: One level     Bathroom Shower/Tub: Tub/shower unit         Home Equipment: Environmental consultant - 2 wheels;Cane - single point;Shower seat   Additional Comments: denies falls in past 1 year      Prior Functioning/Environment Level of Independence: Independent with assistive device(s)        Comments: walks with RW or SPC at baseline, sits  on shower seat to bathe        OT Problem List: Decreased strength;Decreased activity tolerance;Decreased coordination;Obesity      OT Treatment/Interventions: Self-care/ADL training;Patient/family education;DME and/or AE instruction    OT Goals(Current goals can be found in the care plan section) Acute Rehab OT Goals Patient Stated Goal: return to walking independently with RW OT Goal Formulation: With patient Time For Goal Achievement: 01/06/20 Potential to Achieve Goals: Good  OT Frequency: Min 2X/week    AM-PAC OT "6 Clicks" Daily Activity     Outcome Measure Help from another person eating meals?: None Help from another person taking care of personal grooming?: A Little Help from another person toileting, which includes using toliet, bedpan, or urinal?: A Lot Help from another person bathing (including washing, rinsing, drying)?: A Little Help from another person to put on and taking off regular upper body clothing?: A Little Help from another person to put on and taking off regular lower body clothing?: A Lot 6 Click Score: 17   End of Session Equipment Utilized During Treatment: Rolling walker Nurse Communication: Mobility status  Activity Tolerance: Patient limited by fatigue Patient left: in chair  OT Visit Diagnosis: Other abnormalities of gait and mobility (R26.89);Unsteadiness on feet (R26.81);Muscle weakness (generalized) (M62.81);History of falling (Z91.81)                Time: 1610-9604 OT Time Calculation (min): 33 min Charges:  OT General Charges $OT Visit: 1 Visit OT Evaluation $OT Eval Moderate Complexity: 1 Mod OT Treatments $Self Care/Home Management : 8-22 mins  Kari Baars, OT Acute Rehabilitation Services [phone] Office- [phone], Edwena Felty D 12/30/2019, 4:24 PM

## 2019-12-30 NOTE — Plan of Care (Signed)
  Problem: Activity: Goal: Risk for activity intolerance will decrease Outcome: Progressing   

## 2019-12-30 NOTE — Progress Notes (Signed)
PROGRESS NOTE    Helen Hardy  JKD:[id number] DOB: 25-Nov-1950 DOA: 12/25/2019 PCP: Deland Pretty, MD    Brief Narrative:  69 year old female with a past medical history for non-Hodgkin's lymphoma, (diagnosis 2004, now in remission), rheumatoid arthritis, hypertension, GERD, osteoarthritis, obesity class III and lumbar radiculopathy, presented with acute onset of severe abdominal pain, diffuse, predominantly at the right quadrants, associated with severe and profuse bloody diarrhea.  Her symptoms were preceded by generalized malaise and weakness for 2 days.  No nausea and no vomiting, but significant decreased p.o. intake. Labs showed  BUN 27, creatinine 1.41, white count 7.5, hemoglobin 12.0, hematocrit 37.9, platelets 197.  SARS COVID-19 negative.  CT of the abdomen and pelvis with diffuse diverticulitis, enteritis involving the cecum and terminal ileum with significant surrounding inflammatory changes.  Patient admitted for further management.    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 difficile negative, GI panel showed enteropathogenic E. Coli GI on board, appreciate recs S/P IV Zosyn--> completed 3 days of azithromycin D/C IV fluids Advance diet to regular and consult nutrition for supplements  AKI with non anion gap metabolic acidosis Resolved Discontinue IV fluids  RA/ chronic back pain/ bilateral knee osteoarthritis Continue pain control with methocarbamol, hydrocodone, leflunomide and gabapentin,  continue home prednisone for now  Anemia of chronic disease Baseline hemoglobin around 10 Daily CBC  GERD Continue with pantoprazole  Hypertension Monitor closely  Morbid obesity BMI 45.32 Lifestyle modification advised    Status is: Inpatient  Remains inpatient appropriate because:IV treatments appropriate due to intensity of illness or inability to take PO   Dispo: The patient is from: Home              Anticipated d/c is to: Home              Anticipated d/c date is: 1 day              Patient currently is not medically stable to d/c.   DVT prophylaxis: Enoxaparin   Code Status:   full  Family Communication:  Discussed extensively with patient, no family the bedside      Consultants:   GI    Antimicrobials:   Zosyn--> azithromycin   Subjective: Reports diarrhea and abdominal pain has now resolved, patient deconditioned, denies any chest pain, shortness of breath, fever/chills.    Objective: Vitals:   12/29/19 2030 12/30/19 0558 12/30/19 1354 12/30/19 1401  BP: (!) 157/71 (!) 152/82 (!) 170/83 (!) 159/99  Pulse: 72 80 79 84  Resp: 17 17  18   Temp: 98.4 F (36.9 C) 99.1 F (37.3 C) 98.8 F (37.1 C) 98 F (36.7 C)  TempSrc:  Oral Oral Oral  SpO2: 100% 100% 100% 100%  Weight:      Height:        Intake/Output Summary (Last 24 hours) at 12/30/2019 1717 Last data filed at 12/30/2019 1030 Gross per 24 hour  Intake 1090.88 ml  Output 800 ml  Net 290.88 ml   Filed Weights   12/25/19 0200 12/25/19 2304  Weight: 105.7 kg  101.8 kg    Examination:   General: NAD, deconditioned  Cardiovascular: S1, S2 present  Respiratory: CTAB  Abdomen: Soft, NT, obese, bowel sounds present  Musculoskeletal: No bilateral pedal edema noted, noted arthritic changes of extremities  Skin: Normal  Psychiatry: Normal mood     Data Reviewed: I have personally reviewed following labs and imaging studies  CBC: Recent Labs  Lab 12/26/19 0530  12/27/19 0617 12/28/19 0538 12/29/19 0518 12/30/19 0527  WBC 11.4* 9.2 6.5 5.3 4.8  NEUTROABS 8.0* 6.7 4.3 3.0 2.3  HGB 11.4* 10.3* 9.6* 9.5* 9.0*  HCT 35.6* 31.9* 29.6* 29.4* 27.7*  MCV 112.3* 110.4* 110.4* 110.5* 108.6*  PLT 161 143* 137* 133* 286*   Basic Metabolic Panel: Recent Labs  Lab 12/26/19 0530 12/27/19 0617 12/28/19 0538 12/29/19 0518 12/30/19 0527  NA 138 139 141 142 142  K 4.3 3.8 3.7 3.6 3.4*  CL 107 110 113* 113* 113*  CO2 20* 21* 21* 22 21*  GLUCOSE 102* 112* 98 104* 86  BUN 24* 20 19 17 14   CREATININE 1.29* 1.26* 0.92 0.80 0.79  CALCIUM 8.8* 8.3* 8.3* 8.2* 8.1*  MG  --  2.2  --   --   --    GFR: Estimated Creatinine Clearance: 68.8 mL/min (by C-G formula based on SCr of 0.79 mg/dL). Liver Function Tests: Recent Labs  Lab 12/25/19 0209  AST 29  ALT 23  ALKPHOS 55  BILITOT 0.6  PROT 6.9  ALBUMIN 3.7   Recent Labs  Lab 12/25/19 0209  LIPASE 19   No results for input(s): AMMONIA in the last 168 hours. Coagulation Profile: No results for input(s): INR, PROTIME in the last 168 hours. Cardiac Enzymes: No results for input(s): CKTOTAL, CKMB, CKMBINDEX, TROPONINI in the last 168 hours. BNP (last 3 results) No results for input(s): PROBNP in the last 8760 hours. HbA1C: No results for input(s): HGBA1C in the last 72 hours. CBG: Recent Labs  Lab 12/30/19 1605  GLUCAP 123*   Lipid Profile: No results for input(s): CHOL, HDL, LDLCALC, TRIG, CHOLHDL, LDLDIRECT in the last 72 hours. Thyroid Function Tests: No results for input(s): TSH, T4TOTAL, FREET4, T3FREE, THYROIDAB in the last 72 hours. Anemia Panel: No results for input(s): VITAMINB12, FOLATE, FERRITIN, TIBC, IRON, RETICCTPCT in the last 72 hours.    Radiology Studies: I have reviewed all of the imaging during this hospital visit personally     Scheduled Meds: . vitamin C  1,000 mg Oral Daily  . cholecalciferol  2,000 Units Oral Daily  . estradiol  1 mg Oral Daily  . feeding  supplement (KATE FARMS STANDARD 1.4)  325 mL Oral Q24H  . feeding supplement (PRO-STAT SUGAR FREE 64)  30 mL Oral BID BM  . folic acid  2 mg Oral Daily  . gabapentin  300 mg Oral 2 times per day   And  . gabapentin  600 mg Oral QHS  . leflunomide  20 mg Oral Daily  . methocarbamol  750 mg Oral BID  . multivitamin with minerals  1 tablet Oral Daily  . pantoprazole  40 mg Oral Daily  . predniSONE  10 mg Oral Daily  . vitamin B-12  1,000 mcg Oral Daily   Continuous Infusions:    LOS: 5 days        Alma Friendly, MD

## 2019-12-31 LAB — BASIC METABOLIC PANEL
Anion gap: 9 (ref 5–15)
BUN: 15 mg/dL (ref 8–23)
CO2: 22 mmol/L (ref 22–32)
Calcium: 8.1 mg/dL — ABNORMAL LOW (ref 8.9–10.3)
Chloride: 108 mmol/L (ref 98–111)
Creatinine, Ser: 0.9 mg/dL (ref 0.44–1.00)
GFR calc Af Amer: >60 mL/min (ref >60)
GFR calc non Af Amer: >60 mL/min (ref >60)
Glucose, Bld: 85 mg/dL (ref 70–99)
Potassium: 3.4 mmol/L — ABNORMAL LOW (ref 3.5–5.1)
Sodium: 139 mmol/L (ref 135–145)

## 2019-12-31 LAB — GLUCOSE, CAPILLARY: Glucose-Capillary: 84 mg/dL (ref 70–99)

## 2019-12-31 MED ORDER — POTASSIUM CHLORIDE CRYS ER 20 MEQ PO TBCR
40.0000 meq | EXTENDED_RELEASE_TABLET | Freq: Once | ORAL | Status: AC
  Administered 2019-12-31: 40 meq via ORAL
  Filled 2019-12-31: qty 2

## 2019-12-31 MED ORDER — LOPERAMIDE HCL 2 MG PO CAPS
2.0000 mg | ORAL_CAPSULE | Freq: Once | ORAL | Status: AC
  Administered 2019-12-31: 2 mg via ORAL
  Filled 2019-12-31: qty 1

## 2019-12-31 NOTE — Progress Notes (Signed)
Occupational Therapy Treatment Patient Details Name: Helen Hardy MRN: [id number] DOB: [birth year] Today's Date: 12/31/2019    History of present illness 69 y.o. female with medical history significant of non-Hodgkin's lymphoma (dx in 2004) in remission, rheumatoid arthritis since age 79, chronic pain, hypertension, GERD, osteoarthritis, lumbar radiculopathy, morbid obesity, anemia of chronic disease presented to the ED with abdominal pain and bloody diarrhea. Dx of cecal diverticulitis, AKI.   OT comments  Pt very motivated but does fatigue quickly  Follow Up Recommendations  Home health OT;Supervision/Assistance - 24 hour    Equipment Recommendations  3 in 1 bedside commode    Recommendations for Other Services      Precautions / Restrictions Precautions Precautions: Fall Precaution Comments: denies h/o falls in past 1 year       Mobility Bed Mobility               General bed mobility comments: Pt OOB  Transfers Overall transfer level: Needs assistance Equipment used: Rolling walker (2 wheeled) Transfers: Sit to/from [facility] Sit to Stand: Min assist;Mod assist Stand pivot transfers: Min assist;Mod assist            Balance Overall balance assessment: Needs assistance   Sitting balance-Leahy Scale: Fair       Standing balance-Leahy Scale: Poor                             ADL either performed or assessed with clinical judgement   ADL       Grooming: Standing;Min guard                   Toilet Transfer: Minimal assistance;RW;Stand-pivot;Cueing for safety;Cueing for sequencing   Toileting- Clothing Manipulation and Hygiene: Maximal assistance;Sit to/from stand;Cueing for safety;Cueing for compensatory techniques;Cueing for sequencing         General ADL Comments: pt fatigued after multiple trips to [facility].  LEft sitting in recliner. No family present this monring               Cognition  Arousal/Alertness: Awake/alert Behavior During Therapy: WFL for tasks assessed/performed Overall Cognitive Status: Within Functional Limits for tasks assessed                                                     Pertinent Vitals/ Pain       Pain Assessment: No/denies pain  Home Living Family/patient expects to be discharged to:: Private residence Living Arrangements: Spouse/significant other Available Help at Discharge: Family;Available 24 hours/day Type of Home: House Home Access: Stairs to enter [employer] of Steps: 1   Home Layout: One level     Bathroom Shower/Tub: Tub/shower unit         Home Equipment: Environmental consultant - 2 wheels;Cane - single point;Shower seat   Additional Comments: denies falls in past 1 year      Prior Functioning/Environment Level of Independence: Independent with assistive device(s)        Comments: walks with RW or SPC at baseline, sits on shower seat to bathe   Frequency  Min 2X/week        Progress Toward Goals  OT Goals(current goals can now be found in the care plan section)  Progress towards OT goals: Progressing toward goals     Plan Discharge  plan remains appropriate (as long as family can provide 24/7 A)       AM-PAC OT "6 Clicks" Daily Activity     Outcome Measure   Help from another person eating meals?: None Help from another person taking care of personal grooming?: A Little Help from another person toileting, which includes using toliet, bedpan, or urinal?: A Lot Help from another person bathing (including washing, rinsing, drying)?: A Little Help from another person to put on and taking off regular upper body clothing?: A Little Help from another person to put on and taking off regular lower body clothing?: A Lot 6 Click Score: 17    End of Session Equipment Utilized During Treatment: Rolling walker  OT Visit Diagnosis: Other abnormalities of gait and mobility (R26.89);Unsteadiness on  feet (R26.81);Muscle weakness (generalized) (M62.81);History of falling (Z91.81)   Activity Tolerance Patient limited by fatigue   Patient Left in chair   Nurse Communication Mobility status        Time: 6861-6837 OT Time Calculation (min): 18 min  Charges: OT General Charges $OT Visit: 1 Visit OT Treatments $Self Care/Home Management : 8-22 mins  Kari Baars, [place] [phone] Office- [phone]      Madeline Pho, Edwena Felty D 12/31/2019, 11:50 AM

## 2019-12-31 NOTE — TOC Transition Note (Signed)
Transition of Care [facility]) - CM/SW Discharge Note   Patient Details  Name: Lesette Collie [name] MRN: [id number] Date of Birth: [birth year]  Transition of Care [facility]) CM/SW Contact:  Servando Snare, LCSW Phone Number: 12/31/2019, 1:41 PM   Clinical Narrative:   Patient going home with home health. Home health arranged with [place] ( [place]) agency will contact patient directly. 3 in 1 will be delivered to patients room prior to dc.   Servando Snare, [place] CSW [phone]   Final next level of care: Home w Home Health Services Barriers to Discharge: No Barriers Identified   Patient Goals and CMS Choice Patient states their goals for this hospitalization and ongoing recovery are:: to go home   Choice offered to / list presented to : NA  Discharge Placement                       Discharge Plan and Services In-house Referral: Clinical Social Work              DME Arranged: 3-N-1 DME Agency: [employer] Date DME Agency Contacted: 12/31/19 Time DME Agency Contacted: [phone] Representative spoke with at DME Agency: [employer] Arranged: PT, OT, Nurse's Aide, Social Work [employer] Agency: [place] ([place]) Date [place]: 12/31/19 Time [place]: [place] Representative spoke with at [place]: [place] ([place]) Interventions     Readmission Risk Interventions No flowsheet data found.

## 2019-12-31 NOTE — TOC Progression Note (Signed)
Transition of Care [facility]) - Progression Note    Patient Details  Name: Helen Hardy MRN: [id number] Date of Birth: 28-Feb-1951  Transition of Care [facility]) CM/SW Contact  Servando Snare, [place] Phone Number: 12/31/2019, 11:58 AM  Clinical Narrative:    LCSW met with patient at bedside. [place] has a LTC policy that will pay for extra help in the home. Patients daughter in law is bringing the paperwork to the hospital. LCSW will review paperwork to determine if there are any needs from the hospital. Patient is agreeable and prefers to go home with home health. Patient reports her spouse is home to help her and her daughter in law can come help when he is not available. LCSW talked to about including [place] aide and social worker to assist patient.   Carolin Coy [place] CSW [phone]    Expected Discharge Plan: [place] (vs short term SNF) Barriers to Discharge: Continued Medical Work up  Expected Discharge Plan and Services Expected Discharge Plan: [place] (vs short term SNF) In-house Referral: Clinical Social Work     Living arrangements for the past 2 months: Single Family Home                                       Social Determinants of Health (SDOH) Interventions    Readmission Risk Interventions No flowsheet data found.

## 2019-12-31 NOTE — Discharge Summary (Signed)
Discharge Summary  Helen Hardy JFH:[id number] DOB: [birth year]  PCP: Deland Pretty, MD  Admit date: 12/25/2019 Discharge date: 12/31/2019  Time spent: 40 mins  Recommendations for Outpatient Follow-up:  1. PCP in 1 week 2. GI in 2 weeks  Discharge Diagnoses:  Active Hospital Problems   Diagnosis Date Noted  . 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 Rheumatoid arthritis ([place]) 12/25/2019  . GERD (gastroesophageal reflux disease) 12/25/2019  . Essential hypertension 12/25/2019  . AKI (acute kidney injury) ([place]) 12/25/2019  . Hyperglycemia 12/25/2019  . Chronic radicular lumbar pain 12/25/2019  . Generalized weakness 12/25/2019  . Morbid obesity ([place]) 04/10/2015    Resolved Hospital Problems   Diagnosis Date Noted Date Resolved  . Acute diverticulitis 12/25/2019 12/25/2019    Discharge Condition: Stable  Diet recommendation: Heart healthy  Vitals:   12/30/19 2122 12/31/19 0548  BP: (!) 145/69 (!) 166/89  Pulse: 86 79  Resp: 17 18  Temp: 98.6 F (37 C) 98.1 F (36.7 C)  SpO2: 98% 98%    History of present illness:  69 year old female with a past medical history for non-Hodgkin's lymphoma, (diagnosis 2004, now in remission), rheumatoid arthritis, hypertension, GERD, osteoarthritis, obesity class III and lumbar radiculopathy, presented with acute onset of severe abdominal pain, diffuse, predominantly at the right quadrants, associated with severe and profuse bloody diarrhea.  Her symptoms were preceded by generalized malaise and weakness for 2 days.  No nausea and no vomiting, but significant decreased p.o. intake. Labs showed  BUN 27, creatinine 1.41, white count 7.5, hemoglobin 12.0, hematocrit 37.9, platelets 197.  SARS COVID-19 negative.  CT of the abdomen and pelvis with diffuse diverticulitis, enteritis involving the cecum and terminal ileum with significant surrounding inflammatory changes. Patient admitted for further  management.    Today, pt denies any new complaints, denies abdominal pian, N/V, further diarrhea, fever/chills, chest pain, SOB.  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 difficile negative, GI panel showed enteropathogenic E. Coli GI consulted, follow up in 2 weeks Status post IV fluids S/P IV Zosyn--> completed 3 days of azithromycin Follow-up with PCP in 1 week  AKI with non anion gap metabolic acidosis Resolved status post IV fluids  RA/ chronic back pain/ bilateral knee osteoarthritis Continue home regimen pain control with methocarbamol, hydrocodone, leflunomide and gabapentin Continue home prednisone  Anemia of chronic disease Baseline hemoglobin around 10  GERD Continue with pantoprazole  Hypertension Continue home BP meds  Morbid obesity BMI 45.32 Lifestyle modification advised        Malnutrition Type:  Nutrition Problem: Increased nutrient needs Etiology: acute illness   Malnutrition Characteristics:  Signs/Symptoms: estimated needs   Nutrition Interventions:  Interventions: MVI, Prostat, Other (Comment) [employer])   Estimated body mass index is 45.32 kg/m as calculated from the following:   Height as of this encounter: 4' 11"  (1.499 m).   Weight as of this encounter: 101.8 kg.    Procedures:  None  Consultations:  GI  Discharge Exam:  BP (!) 166/89   Pulse 79   Temp 98.1 F  (36.7 C) (Oral)   Resp 18   Ht 4' 11"  (1.499 m)   Wt 101.8 kg   SpO2 98%   BMI 45.32 kg/m   General: NAD Cardiovascular: S1, S2 present Respiratory: CTA B Abdomen: Soft, nontender, nondistended, bowel sounds present    Discharge Instructions You were cared for by a hospitalist during your hospital stay. If you have any questions about your discharge medications or the care you received while you were in the hospital after you are discharged, you can call the unit and asked to speak with the hospitalist on call if the hospitalist that took care of you is not available. Once you are discharged, your primary care physician will handle any further medical issues. Please note that NO REFILLS for any discharge medications will be authorized once you are discharged, as it is imperative that you return to your primary care physician (or establish a relationship with a primary care physician if you do not have one) for your aftercare needs so that they can reassess your need for medications and monitor your lab values.  Discharge Instructions    Diet - low sodium heart healthy   Complete by: As directed    Increase activity slowly   Complete by: As directed      Allergies as of 12/31/2019      Reactions   Tramadol    Oxycodone Nausea And Vomiting      Medication List    TAKE these medications   estradiol 1 MG tablet Commonly known as: ESTRACE Take 1 mg by mouth daily.   folic acid 1 MG tablet Commonly known as: FOLVITE Take 2 mg by mouth daily.   furosemide 40 MG tablet Commonly known as: LASIX Take 40 mg by mouth daily.   gabapentin 300 MG capsule Commonly known as: NEURONTIN Take 300-600 mg by mouth See admin instructions. 300 mg in the morning and at 2 pm. 600 mg every night   HYDROcodone-acetaminophen 10-325 MG tablet Commonly known as: NORCO Take 1 tablet by mouth every 8 (eight) hours as needed for moderate pain or severe pain.   leflunomide 20 MG tablet Commonly  known as: ARAVA Take 20 mg by mouth daily.   methocarbamol 500 MG tablet Commonly known as: ROBAXIN Take 750 mg by mouth in the morning and at bedtime.   multivitamin tablet Take 1 tablet by mouth daily.   olmesartan 20 MG tablet Commonly known as: BENICAR Take 20 mg by mouth daily.   ondansetron 4 MG tablet Commonly known as: ZOFRAN Take 4 mg by mouth every 8 (eight) hours as needed for nausea or vomiting.   oxyCODONE-acetaminophen 10-325 MG tablet Commonly known as: PERCOCET Take 1 tablet by mouth every 8 (eight) hours as needed for pain.   pantoprazole 40 MG tablet Commonly known as: PROTONIX Take 40 mg by mouth daily.   Potassium Chloride ER 20 MEQ Tbcr Take 20 mEq by mouth daily.   predniSONE 10 MG tablet Commonly known as: DELTASONE Take 10 mg by mouth daily.   triamcinolone cream 0.1 % Commonly known as: KENALOG Apply 1 application topically as needed (for rash).   vitamin B-12 1000 MCG tablet Commonly known as: CYANOCOBALAMIN Take 1,000 mcg by mouth daily.   vitamin C 1000 MG tablet Take 1,000 mg by mouth daily.   Vitamin D3 25 MCG (1000 UT) Caps Take 2,000 Units by mouth daily.  Allergies  Allergen Reactions  . Tramadol   . Oxycodone Nausea And Vomiting    Follow-up Information    Deland Pretty, MD. Schedule an appointment as soon as possible for a visit in 1 week(s).   Specialty: Internal Medicine Contact information: [address] [place] [address] [phone]        Carol Ada, MD. Schedule an appointment as soon as possible for a visit in 2 week(s).   Specialty: Gastroenterology Contact information: [address] [place] [address] [phone]                The results of significant diagnostics from this hospitalization (including imaging, microbiology, ancillary and laboratory) are listed below for reference.    Significant Diagnostic Studies: CT ABDOMEN PELVIS W  CONTRAST  Result Date: 12/25/2019 CLINICAL DATA:  Lower GI bleeding and diarrhea EXAM: CT ABDOMEN AND PELVIS WITH CONTRAST TECHNIQUE: Multidetector CT imaging of the abdomen and pelvis was performed using the standard protocol following bolus administration of intravenous contrast. CONTRAST:  2m OMNIPAQUE IOHEXOL 300 MG/ML  SOLN COMPARISON:  August 17, 2017 FINDINGS: Lower chest: The visualized heart size within normal limits. No pericardial fluid/thickening. No hiatal hernia. Posterior bibasilar ground-glass atelectasis is noted. Hepatobiliary: The liver is normal in density without focal abnormality.The main portal vein is patent. No evidence of calcified gallstones, gallbladder wall thickening or biliary dilatation. Pancreas: Unremarkable. No pancreatic ductal dilatation or surrounding inflammatory changes. Spleen: Normal in size without focal abnormality. Adrenals/Urinary Tract: Both adrenal glands appear normal. The kidneys and collecting system appear normal without evidence of urinary tract calculus or hydronephrosis. Bladder is unremarkable. Stomach/Bowel: The stomach and small bowel are normal in appearance. There is diffuse wall thickening with diverticula and surrounding inflammatory changes involving the cecum and terminal ileum. Scattered small lymph nodes are seen within the right lower quadrant. No surrounding loculated fluid collections or free air. The appendix appears to be mildly prominent measuring 7 mm in transverse dimension, however there is small foci of air seen within the appendix. No surrounding inflammatory changes seen around the appendiceal tip. Vascular/Lymphatic: There are no enlarged mesenteric, retroperitoneal, or pelvic lymph nodes. No significant vascular findings are present. Reproductive: The patient is status post hysterectomy. No adnexal masses or collections seen. Other: No evidence of abdominal wall mass or hernia. Musculoskeletal: No acute or significant osseous  findings. IMPRESSION: Findings consistent with diffuse diverticulitis/enteritis involving the cecum and terminal ileum with significant surrounding inflammatory changes. No loculated fluid collections or free air. Mildly prominent appendix, however no definite evidence of appendicitis. Electronically Signed   By: BPrudencio PairM.D.   On: 12/25/2019 04:38   CT Hip Right Wo Contrast  Result Date: 12/10/2019 CLINICAL DATA:  Right hip pain. EXAM: CT OF THE RIGHT HIP WITHOUT CONTRAST TECHNIQUE: Multidetector CT imaging of the right hip was performed according to the standard protocol. Multiplanar CT image reconstructions were also generated. COMPARISON:  CT scan 08/17/2017 FINDINGS: Bones/Joint/Cartilage No appreciable fracture or acute bony finding. Mild spurring of the right femoral head. Mild spurring at the pubis. Ligaments Suboptimally assessed by CT. Muscles and Tendons Chronic appearing calcification in the proximal hamstring tendon. Soft tissues Sigmoid colon diverticulosis. IMPRESSION: 1. No appreciable fracture or acute bony finding. 2. Mild spurring of the right femoral head and pubis. 3. Chronic appearing calcification in the proximal hamstring tendon. 4. Sigmoid colon diverticulosis. Electronically Signed   By: WVan ClinesM.D.   On: 12/10/2019 17:23    Microbiology: Recent Results (  from the past 240 hour(s))  SARS Coronavirus 2 by RT PCR (hospital order, performed in [facility] hospital lab) Nasopharyngeal Nasopharyngeal Swab     Status: None   Collection Time: 12/25/19  6:32 AM   Specimen: Nasopharyngeal Swab  Result Value Ref Range Status   SARS Coronavirus 2 NEGATIVE NEGATIVE Final    Comment: (NOTE) SARS-CoV-2 target nucleic acids are NOT DETECTED.  The SARS-CoV-2 RNA is generally detectable in upper and lower respiratory specimens during the acute phase of infection. The lowest concentration of SARS-CoV-2 viral copies this assay can detect is 250 copies / mL. A negative result  does not preclude SARS-CoV-2 infection and should not be used as the sole basis for treatment or other patient management decisions.  A negative result may occur with improper specimen collection / handling, submission of specimen other than nasopharyngeal swab, presence of viral mutation(s) within the areas targeted by this assay, and inadequate number of viral copies (<250 copies / mL). A negative result must be combined with clinical observations, patient history, and epidemiological information.  Fact Sheet for Patients:   StrictlyIdeas.no  Fact Sheet for Healthcare Providers: BankingDealers.co.za  This test is not yet approved or  cleared by the [place] FDA and has been authorized for detection and/or diagnosis of SARS-CoV-2 by FDA under an Emergency Use Authorization (EUA).  This EUA will remain in effect (meaning this test can be used) for the duration of the COVID-19 declaration under Section 564(b)(1) of the Act, 21 U.S.C. section 360bbb-3(b)(1), unless the authorization is terminated or revoked sooner.  Performed at [facility], [place] [address], [place], [place] [address]   C Difficile Quick Screen w PCR reflex     Status: None   Collection Time: 12/26/19 12:46 PM   Specimen: STOOL  Result Value Ref Range Status   C Diff antigen NEGATIVE NEGATIVE Final   C Diff toxin NEGATIVE NEGATIVE Final   C Diff interpretation No C. difficile detected.  Final    Comment: Performed at [facility], [place] [address], [place], [place] [address]  Gastrointestinal Panel by PCR , Stool     Status: Abnormal   Collection Time: 12/26/19 12:46 PM   Specimen: STOOL  Result Value Ref Range Status   Campylobacter species NOT DETECTED NOT DETECTED Final   Plesimonas shigelloides NOT DETECTED NOT DETECTED Final   Salmonella species NOT DETECTED NOT DETECTED Final   Yersinia enterocolitica NOT DETECTED NOT  DETECTED Final   Vibrio species NOT DETECTED NOT DETECTED Final   Vibrio cholerae NOT DETECTED NOT DETECTED Final   Enteroaggregative E coli (EAEC) NOT DETECTED NOT DETECTED Final   Enteropathogenic E coli (EPEC) DETECTED (A) NOT DETECTED Final    Comment: RESULT CALLED TO, READ BACK BY AND VERIFIED WITH: KIM HUYNH @1610  12/27/19 MJU    Enterotoxigenic E coli (ETEC) NOT DETECTED NOT DETECTED Final   Shiga like toxin producing E coli (STEC) NOT DETECTED NOT DETECTED Final   Shigella/Enteroinvasive E coli (EIEC) NOT DETECTED NOT DETECTED Final   Cryptosporidium NOT DETECTED NOT DETECTED Final   Cyclospora cayetanensis NOT DETECTED NOT DETECTED Final   Entamoeba histolytica NOT DETECTED NOT DETECTED Final   Giardia lamblia NOT DETECTED NOT DETECTED Final   Adenovirus F40/41 NOT DETECTED NOT DETECTED Final   Astrovirus NOT DETECTED NOT DETECTED Final   Norovirus GI/GII NOT DETECTED NOT DETECTED Final   Rotavirus A NOT DETECTED NOT DETECTED Final   Sapovirus (I, II, IV, and V) NOT DETECTED  NOT DETECTED Final    Comment: Performed at [facility], [address] [place], [place] [address]     Labs: Basic Metabolic Panel: Recent Labs  Lab 12/27/19 0617 12/28/19 0538 12/29/19 0518 12/30/19 0527 12/31/19 0557  NA 139 141 142 142 139  K 3.8 3.7 3.6 3.4* 3.4*  CL 110 113* 113* 113* 108  CO2 21* 21* 22 21* 22  GLUCOSE 112* 98 104* 86 85  BUN 20 19 17 14 15   CREATININE 1.26* 0.92 0.80 0.79 0.90  CALCIUM 8.3* 8.3* 8.2* 8.1* 8.1*  MG 2.2  --   --   --   --    Liver Function Tests: Recent Labs  Lab 12/25/19 0209  AST 29  ALT 23  ALKPHOS 55  BILITOT 0.6  PROT 6.9  ALBUMIN 3.7   Recent Labs  Lab 12/25/19 0209  LIPASE 19   No results for input(s): AMMONIA in the last 168 hours. CBC: Recent Labs  Lab 12/26/19 0530 12/27/19 0617 12/28/19 0538 12/29/19 0518 12/30/19 0527  WBC 11.4* 9.2 6.5 5.3 4.8  NEUTROABS 8.0* 6.7 4.3 3.0 2.3  HGB 11.4* 10.3* 9.6* 9.5* 9.0*   HCT 35.6* 31.9* 29.6* 29.4* 27.7*  MCV 112.3* 110.4* 110.4* 110.5* 108.6*  PLT 161 143* 137* 133* 133*   Cardiac Enzymes: No results for input(s): CKTOTAL, CKMB, CKMBINDEX, TROPONINI in the last 168 hours. BNP: BNP (last 3 results) No results for input(s): BNP in the last 8760 hours.  ProBNP (last 3 results) No results for input(s): PROBNP in the last 8760 hours.  CBG: Recent Labs  Lab 12/30/19 1605 12/31/19 0720  GLUCAP 123* 84       Signed:  Alma Friendly, MD [facility] 12/31/2019, 12:49 PM

## 2020-01-01 DIAGNOSIS — M5416 Radiculopathy, lumbar region: Secondary | ICD-10-CM | POA: Diagnosis not present

## 2020-01-01 DIAGNOSIS — D631 Anemia in chronic kidney disease: Secondary | ICD-10-CM | POA: Diagnosis not present

## 2020-01-01 DIAGNOSIS — K5000 Crohn's disease of small intestine without complications: Secondary | ICD-10-CM | POA: Diagnosis not present

## 2020-01-01 DIAGNOSIS — M069 Rheumatoid arthritis, unspecified: Secondary | ICD-10-CM | POA: Diagnosis not present

## 2020-01-01 DIAGNOSIS — R739 Hyperglycemia, unspecified: Secondary | ICD-10-CM | POA: Diagnosis not present

## 2020-01-01 DIAGNOSIS — M1990 Unspecified osteoarthritis, unspecified site: Secondary | ICD-10-CM | POA: Diagnosis not present

## 2020-01-01 DIAGNOSIS — I129 Hypertensive chronic kidney disease with stage 1 through stage 4 chronic kidney disease, or unspecified chronic kidney disease: Secondary | ICD-10-CM | POA: Diagnosis not present

## 2020-01-01 DIAGNOSIS — N183 Chronic kidney disease, stage 3 unspecified: Secondary | ICD-10-CM | POA: Diagnosis not present

## 2020-01-01 DIAGNOSIS — Z79891 Long term (current) use of opiate analgesic: Secondary | ICD-10-CM | POA: Diagnosis not present

## 2020-01-01 DIAGNOSIS — K219 Gastro-esophageal reflux disease without esophagitis: Secondary | ICD-10-CM | POA: Diagnosis not present

## 2020-01-01 DIAGNOSIS — Z9181 History of falling: Secondary | ICD-10-CM | POA: Diagnosis not present

## 2020-01-01 DIAGNOSIS — Z7952 Long term (current) use of systemic steroids: Secondary | ICD-10-CM | POA: Diagnosis not present

## 2020-01-01 DIAGNOSIS — Z90710 Acquired absence of both cervix and uterus: Secondary | ICD-10-CM | POA: Diagnosis not present

## 2020-01-01 DIAGNOSIS — Z6841 Body Mass Index (BMI) 40.0 and over, adult: Secondary | ICD-10-CM | POA: Diagnosis not present

## 2020-01-01 DIAGNOSIS — G8929 Other chronic pain: Secondary | ICD-10-CM | POA: Diagnosis not present

## 2020-01-01 DIAGNOSIS — K5751 Diverticulosis of both small and large intestine without perforation or abscess with bleeding: Secondary | ICD-10-CM | POA: Diagnosis not present

## 2020-01-02 DIAGNOSIS — K5000 Crohn's disease of small intestine without complications: Secondary | ICD-10-CM | POA: Diagnosis not present

## 2020-01-02 DIAGNOSIS — M069 Rheumatoid arthritis, unspecified: Secondary | ICD-10-CM | POA: Diagnosis not present

## 2020-01-02 DIAGNOSIS — K5751 Diverticulosis of both small and large intestine without perforation or abscess with bleeding: Secondary | ICD-10-CM | POA: Diagnosis not present

## 2020-01-02 DIAGNOSIS — M1990 Unspecified osteoarthritis, unspecified site: Secondary | ICD-10-CM | POA: Diagnosis not present

## 2020-01-02 DIAGNOSIS — G8929 Other chronic pain: Secondary | ICD-10-CM | POA: Diagnosis not present

## 2020-01-02 DIAGNOSIS — M5416 Radiculopathy, lumbar region: Secondary | ICD-10-CM | POA: Diagnosis not present

## 2020-01-04 DIAGNOSIS — K5751 Diverticulosis of both small and large intestine without perforation or abscess with bleeding: Secondary | ICD-10-CM | POA: Diagnosis not present

## 2020-01-04 DIAGNOSIS — M5416 Radiculopathy, lumbar region: Secondary | ICD-10-CM | POA: Diagnosis not present

## 2020-01-04 DIAGNOSIS — K5000 Crohn's disease of small intestine without complications: Secondary | ICD-10-CM | POA: Diagnosis not present

## 2020-01-04 DIAGNOSIS — M069 Rheumatoid arthritis, unspecified: Secondary | ICD-10-CM | POA: Diagnosis not present

## 2020-01-04 DIAGNOSIS — G8929 Other chronic pain: Secondary | ICD-10-CM | POA: Diagnosis not present

## 2020-01-04 DIAGNOSIS — M1990 Unspecified osteoarthritis, unspecified site: Secondary | ICD-10-CM | POA: Diagnosis not present

## 2020-01-05 ENCOUNTER — Encounter: Payer: Self-pay | Admitting: *Deleted

## 2020-01-05 ENCOUNTER — Other Ambulatory Visit: Payer: Self-pay | Admitting: *Deleted

## 2020-01-05 NOTE — Patient Outreach (Signed)
[facility]) Care Management  01/05/2020  Shaniece Collie [name] 11-23-1950 [id number]  Episode/ note created in error  Oneta Rack, RN, BSN, CCRN [facility] Care Management  [phone]

## 2020-01-05 NOTE — Patient Outreach (Signed)
[facility]) Care Management [facility] CM Telephone Outreach, EMMI Red-Alert notification/ General Discharge PCP office completes Transition of Care follow up post-hospital discharge Post-hospital discharge day # 5  01/05/2020  Helen Hardy 12-06-1950 [id number]  EMMI Red-Alert notification/ General Discharge EMMI call date/ day #: Wednesday January 03, 2020; day # 1 Red-Alert reason(s): "has not read discharge papers;" "No scheduled follow up;" "Other questions/ problems"  Successful telephone outreach to Helen Hardy, 69 y/o female referred to [facility] RN CM 01/04/20 by [facility] CMA for EMMI red-Alert notification as above.  Patient was recently hospitalized June 21-27, 2021 for abdominal pain, diarrhea, malaise, weakness and was diagnosed with cecal diverticulitis due to [facility].  Patient was discharged home to self-care with home health services in place for PT and RN.  Patient has history including, but not limited to, anemia, GERD, HTN, non-hodgkins lymphoma (now in remission) and obesity.  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  She confirms that home health services are active and have arrived.    Patient reports that she recalls EMMI Red- Alert automated call; states, "that was sure a funny call," stating that "it didn't seem like it understood me."  Each EMMI Red-alert item discussed with patient who denies all concerns around reported red-alerts today.  She confirms that she has read her discharge papers, has scheduled post-discharge appointment with her GI doctor, and plans to schedule appointment with PCP "next week," "Once things calm down after the holiday."  She denies clinical concerns  around her condition and reports she is slowly recuperating.  Denies medication concerns, patient was recently discharged from hospital and all medications were reviewed during phone call today-- no concerns or discrepancies identified.  Made patient aware that she may receive additional automated EMMI calls post-hospital discharge and that follow up would occur if concerns are identified with automated call; advised that I would place letter in mail to her and encouraged her to contact me or the [facility] department should needs arise, and she is agreeable.  Plan:  Will make patient inactive with [facility] CM, as she has declined ongoing participation in [place] program, and will make patient's PCP aware of same.  Oneta Rack, RN, BSN, [employer] [facility] Care Management  [phone]

## 2020-01-09 DIAGNOSIS — K5751 Diverticulosis of both small and large intestine without perforation or abscess with bleeding: Secondary | ICD-10-CM | POA: Diagnosis not present

## 2020-01-09 DIAGNOSIS — M5416 Radiculopathy, lumbar region: Secondary | ICD-10-CM | POA: Diagnosis not present

## 2020-01-09 DIAGNOSIS — G8929 Other chronic pain: Secondary | ICD-10-CM | POA: Diagnosis not present

## 2020-01-09 DIAGNOSIS — M1990 Unspecified osteoarthritis, unspecified site: Secondary | ICD-10-CM | POA: Diagnosis not present

## 2020-01-09 DIAGNOSIS — K5000 Crohn's disease of small intestine without complications: Secondary | ICD-10-CM | POA: Diagnosis not present

## 2020-01-09 DIAGNOSIS — M069 Rheumatoid arthritis, unspecified: Secondary | ICD-10-CM | POA: Diagnosis not present

## 2020-01-11 DIAGNOSIS — M069 Rheumatoid arthritis, unspecified: Secondary | ICD-10-CM | POA: Diagnosis not present

## 2020-01-11 DIAGNOSIS — M1990 Unspecified osteoarthritis, unspecified site: Secondary | ICD-10-CM | POA: Diagnosis not present

## 2020-01-11 DIAGNOSIS — K5000 Crohn's disease of small intestine without complications: Secondary | ICD-10-CM | POA: Diagnosis not present

## 2020-01-11 DIAGNOSIS — M5416 Radiculopathy, lumbar region: Secondary | ICD-10-CM | POA: Diagnosis not present

## 2020-01-11 DIAGNOSIS — G8929 Other chronic pain: Secondary | ICD-10-CM | POA: Diagnosis not present

## 2020-01-11 DIAGNOSIS — K5751 Diverticulosis of both small and large intestine without perforation or abscess with bleeding: Secondary | ICD-10-CM | POA: Diagnosis not present

## 2020-01-12 DIAGNOSIS — R600 Localized edema: Secondary | ICD-10-CM | POA: Diagnosis not present

## 2020-01-12 DIAGNOSIS — K5732 Diverticulitis of large intestine without perforation or abscess without bleeding: Secondary | ICD-10-CM | POA: Diagnosis not present

## 2020-01-15 DIAGNOSIS — R197 Diarrhea, unspecified: Secondary | ICD-10-CM | POA: Diagnosis not present

## 2020-01-15 DIAGNOSIS — R5383 Other fatigue: Secondary | ICD-10-CM | POA: Diagnosis not present

## 2020-01-16 DIAGNOSIS — M069 Rheumatoid arthritis, unspecified: Secondary | ICD-10-CM | POA: Diagnosis not present

## 2020-01-16 DIAGNOSIS — M1990 Unspecified osteoarthritis, unspecified site: Secondary | ICD-10-CM | POA: Diagnosis not present

## 2020-01-16 DIAGNOSIS — K5000 Crohn's disease of small intestine without complications: Secondary | ICD-10-CM | POA: Diagnosis not present

## 2020-01-16 DIAGNOSIS — K5751 Diverticulosis of both small and large intestine without perforation or abscess with bleeding: Secondary | ICD-10-CM | POA: Diagnosis not present

## 2020-01-16 DIAGNOSIS — M5416 Radiculopathy, lumbar region: Secondary | ICD-10-CM | POA: Diagnosis not present

## 2020-01-16 DIAGNOSIS — G8929 Other chronic pain: Secondary | ICD-10-CM | POA: Diagnosis not present

## 2020-01-17 ENCOUNTER — Inpatient Hospital Stay: Payer: Medicare Other | Attending: Hematology & Oncology

## 2020-01-17 ENCOUNTER — Inpatient Hospital Stay: Payer: Medicare Other

## 2020-01-17 ENCOUNTER — Inpatient Hospital Stay (HOSPITAL_BASED_OUTPATIENT_CLINIC_OR_DEPARTMENT_OTHER): Payer: Medicare Other | Admitting: Family

## 2020-01-17 ENCOUNTER — Other Ambulatory Visit: Payer: Self-pay

## 2020-01-17 VITALS — BP 130/78 | HR 87 | Temp 98.1°F | Resp 19 | Wt 217.1 lb

## 2020-01-17 DIAGNOSIS — Z7952 Long term (current) use of systemic steroids: Secondary | ICD-10-CM | POA: Insufficient documentation

## 2020-01-17 DIAGNOSIS — D631 Anemia in chronic kidney disease: Secondary | ICD-10-CM | POA: Insufficient documentation

## 2020-01-17 DIAGNOSIS — D500 Iron deficiency anemia secondary to blood loss (chronic): Secondary | ICD-10-CM | POA: Diagnosis not present

## 2020-01-17 DIAGNOSIS — Z8572 Personal history of non-Hodgkin lymphomas: Secondary | ICD-10-CM | POA: Insufficient documentation

## 2020-01-17 DIAGNOSIS — D638 Anemia in other chronic diseases classified elsewhere: Secondary | ICD-10-CM | POA: Diagnosis not present

## 2020-01-17 DIAGNOSIS — N289 Disorder of kidney and ureter, unspecified: Secondary | ICD-10-CM | POA: Diagnosis not present

## 2020-01-17 DIAGNOSIS — D509 Iron deficiency anemia, unspecified: Secondary | ICD-10-CM | POA: Diagnosis not present

## 2020-01-17 DIAGNOSIS — N189 Chronic kidney disease, unspecified: Secondary | ICD-10-CM | POA: Insufficient documentation

## 2020-01-17 DIAGNOSIS — D573 Sickle-cell trait: Secondary | ICD-10-CM | POA: Diagnosis not present

## 2020-01-17 DIAGNOSIS — K909 Intestinal malabsorption, unspecified: Secondary | ICD-10-CM | POA: Diagnosis not present

## 2020-01-17 DIAGNOSIS — Z79899 Other long term (current) drug therapy: Secondary | ICD-10-CM | POA: Diagnosis not present

## 2020-01-17 DIAGNOSIS — N183 Chronic kidney disease, stage 3 unspecified: Secondary | ICD-10-CM

## 2020-01-17 LAB — RETICULOCYTES
Immature Retic Fract: 27.6 % — ABNORMAL HIGH (ref 2.3–15.9)
RBC.: 2.96 MIL/uL — ABNORMAL LOW (ref 3.87–5.11)
Retic Count, Absolute: 59.8 10*3/uL (ref 19.0–186.0)
Retic Ct Pct: 2 % (ref 0.4–3.1)

## 2020-01-17 LAB — CBC WITH DIFFERENTIAL (CANCER CENTER ONLY)
Abs Immature Granulocytes: 0.1 10*3/uL — ABNORMAL HIGH (ref 0.00–0.07)
Basophils Absolute: 0 10*3/uL (ref 0.0–0.1)
Basophils Relative: 0 %
Eosinophils Absolute: 0 10*3/uL (ref 0.0–0.5)
Eosinophils Relative: 0 %
HCT: 33 % — ABNORMAL LOW (ref 36.0–46.0)
Hemoglobin: 10.4 g/dL — ABNORMAL LOW (ref 12.0–15.0)
Immature Granulocytes: 2 %
Lymphocytes Relative: 41 %
Lymphs Abs: 2.4 10*3/uL (ref 0.7–4.0)
MCH: 34.9 pg — ABNORMAL HIGH (ref 26.0–34.0)
MCHC: 31.5 g/dL (ref 30.0–36.0)
MCV: 110.7 fL — ABNORMAL HIGH (ref 80.0–100.0)
Monocytes Absolute: 0.5 10*3/uL (ref 0.1–1.0)
Monocytes Relative: 8 %
Neutro Abs: 2.8 10*3/uL (ref 1.7–7.7)
Neutrophils Relative %: 49 %
Platelet Count: 125 10*3/uL — ABNORMAL LOW (ref 150–400)
RBC: 2.98 MIL/uL — ABNORMAL LOW (ref 3.87–5.11)
RDW: 15.7 % — ABNORMAL HIGH (ref 11.5–15.5)
WBC Count: 5.8 10*3/uL (ref 4.0–10.5)
nRBC: 0 % (ref 0.0–0.2)

## 2020-01-17 LAB — CMP (CANCER CENTER ONLY)
ALT: 24 U/L (ref 0–44)
AST: 30 U/L (ref 15–41)
Albumin: 3.6 g/dL (ref 3.5–5.0)
Alkaline Phosphatase: 60 U/L (ref 38–126)
Anion gap: 11 (ref 5–15)
BUN: 24 mg/dL — ABNORMAL HIGH (ref 8–23)
CO2: 25 mmol/L (ref 22–32)
Calcium: 8.3 mg/dL — ABNORMAL LOW (ref 8.9–10.3)
Chloride: 104 mmol/L (ref 98–111)
Creatinine: 1.31 mg/dL — ABNORMAL HIGH (ref 0.44–1.00)
GFR, Est AFR Am: 48 mL/min — ABNORMAL LOW (ref >60)
GFR, Estimated: 41 mL/min — ABNORMAL LOW (ref >60)
Glucose, Bld: 123 mg/dL — ABNORMAL HIGH (ref 70–99)
Potassium: 5.5 mmol/L — ABNORMAL HIGH (ref 3.5–5.1)
Sodium: 140 mmol/L (ref 135–145)
Total Bilirubin: 0.4 mg/dL (ref 0.3–1.2)
Total Protein: 7 g/dL (ref 6.5–8.1)

## 2020-01-17 MED ORDER — EPOETIN ALFA-EPBX 40000 UNIT/ML IJ SOLN
INTRAMUSCULAR | Status: AC
  Filled 2020-01-17: qty 1

## 2020-01-17 MED ORDER — EPOETIN ALFA-EPBX 40000 UNIT/ML IJ SOLN
40000.0000 [IU] | Freq: Once | INTRAMUSCULAR | Status: AC
  Administered 2020-01-17: 40000 [IU] via SUBCUTANEOUS

## 2020-01-17 NOTE — Progress Notes (Signed)
Hematology and Oncology Follow Up Visit  Helen Hardy [id number] [birth year] 69 y.o. 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 She is recuperating at home now after being hospitalized several weeks ago with acute E.coli gastroenteritis.  She is still feeling very fatigued but is up and mobile using a walker for support.  No falls or syncopal episodes to report.  She has not noted any episodes of bleeding. No bruising or petechiae.  No fever, chills, n/v, cough, rash, dizziness, chest pain, palpitations, abdominal pain or changes in bowel or bladder habits.  No tenderness, numbness or tingling in her extremities.  The puffiness in her feet and ankles is stable. Pedal pulses are 2+.  She states that her appetite is improving and she is hydrating. Her weight is stable.   ECOG Performance Status: 1 - Symptomatic but completely ambulatory  Medications:  Allergies as of 01/17/2020      Reactions   Tramadol    Oxycodone Nausea And Vomiting      Medication List       Accurate as of January 17, 2020  3:46 PM. If you have any questions, ask your nurse or doctor.        STOP taking these medications   HYDROmorphone 4 MG tablet Commonly known as: DILAUDID     TAKE these medications   estradiol 1 MG tablet Commonly known as: ESTRACE Take 1 mg by mouth daily.   folic acid 1 MG tablet Commonly known as: FOLVITE Take 2 mg by mouth daily.   furosemide 40 MG tablet Commonly known as: LASIX Take 40 mg by mouth daily.   gabapentin 300 MG capsule Commonly known as: NEURONTIN Take 300-600 mg by mouth See admin instructions. 300 mg in the morning  and at 2 pm. 600 mg every night   HYDROcodone-acetaminophen 10-325 MG tablet Commonly known as: NORCO Take 1 tablet by mouth every 8 (eight) hours as needed for moderate pain or severe pain.   leflunomide 20 MG tablet Commonly known as: ARAVA Take 20 mg by mouth daily.   methocarbamol 500 MG tablet Commonly known as: ROBAXIN Take 750 mg by mouth in the morning and at bedtime.   multivitamin tablet Take 1 tablet by mouth daily.   olmesartan 20 MG tablet Commonly known as: BENICAR Take 20 mg by mouth daily.   ondansetron 4 MG tablet Commonly known as: ZOFRAN Take 4 mg by mouth every 8 (eight) hours as needed for nausea or vomiting.   oxyCODONE-acetaminophen 10-325 MG tablet Commonly known as: PERCOCET Take 1 tablet by mouth every 8 (eight) hours as needed for pain.   pantoprazole 40 MG tablet Commonly known as: PROTONIX Take 40 mg by mouth daily.   Potassium Chloride ER 20 MEQ Tbcr Take 20 mEq by mouth daily.   predniSONE 10 MG tablet Commonly known as: DELTASONE Take 10 mg by mouth daily.   triamcinolone cream 0.1 % Commonly known as: KENALOG Apply 1 application topically as needed (for rash).   vitamin B-12 1000 MCG tablet Commonly known as: CYANOCOBALAMIN Take 1,000 mcg by mouth daily.   vitamin C 1000 MG tablet Take 1,000 mg by mouth daily.   Vitamin  D3 25 MCG (1000 UT) Caps Take 2,000 Units by mouth daily.       Allergies:  Allergies  Allergen Reactions  . Tramadol   . Oxycodone Nausea And Vomiting    Past Medical History, Surgical history, Social history, and Family History were reviewed and updated.  Review of Systems: All other 10 point review of systems is negative.   Physical Exam:  vitals were not taken for this visit.   Wt Readings from Last 3 Encounters:  12/25/19 224 lb 6.4 oz (101.8 kg)  12/10/19 233 lb (105.7 kg)  12/09/19 235 lb 0.2 oz (106.6 kg)    Ocular: Sclerae unicteric, pupils equal, round and reactive to  light Ear-nose-throat: Oropharynx clear, dentition fair Lymphatic: No cervical or supraclavicular adenopathy Lungs no rales or rhonchi, good excursion bilaterally Heart regular rate and rhythm, no murmur appreciated Abd soft, nontender, positive bowel sounds, no liver or spleen tip palpated on exam, no fluid wave  MSK no focal spinal tenderness, no joint edema Neuro: non-focal, well-oriented, appropriate affect Breasts: Deferred   Lab Results  Component Value Date   WBC 5.8 01/17/2020   HGB 10.4 (L) 01/17/2020   HCT 33.0 (L) 01/17/2020   MCV 110.7 (H) 01/17/2020   PLT 125 (L) 01/17/2020   Lab Results  Component Value Date   FERRITIN 722 (H) 12/06/2019   IRON 81 12/06/2019   TIBC 288 12/06/2019   UIBC 207 12/06/2019   IRONPCTSAT 28 12/06/2019   Lab Results  Component Value Date   RETICCTPCT 2.0 01/17/2020   RBC 2.98 (L) 01/17/2020   RBC 2.96 (L) 01/17/2020   RETICCTABS 41.0 06/21/2015   No results found for: KPAFRELGTCHN, LAMBDASER, KAPLAMBRATIO No results found for: IGGSERUM, IGA, IGMSERUM No results found for: Odetta Pink, SPEI   Chemistry      Component Value Date/Time   NA 139 12/31/2019 0557   NA 144 04/29/2017 0929   K 3.4 (L) 12/31/2019 0557   K 4.0 04/29/2017 0929   CL 108 12/31/2019 0557   CL 103 06/21/2015 0800   CO2 22 12/31/2019 0557   CO2 25 04/29/2017 0929   BUN 15 12/31/2019 0557   BUN 17.7 04/29/2017 0929   CREATININE 0.90 12/31/2019 0557   CREATININE 1.03 (H) 12/06/2019 1107   CREATININE 0.8 04/29/2017 0929      Component Value Date/Time   CALCIUM 8.1 (L) 12/31/2019 0557   CALCIUM 9.3 04/29/2017 0929   ALKPHOS 55 12/25/2019 0209   ALKPHOS 55 04/29/2017 0929   AST 29 12/25/2019 0209   AST 18 12/06/2019 1107   AST 19 04/29/2017 0929   ALT 23 12/25/2019 0209   ALT 16 12/06/2019 1107   ALT 16 04/29/2017 0929   BILITOT 0.6 12/25/2019 0209   BILITOT 0.4 12/06/2019 1107   BILITOT 0.43  04/29/2017 0929       Impression and Plan: Helen Hardy is a very pleasant 69yo African American female with 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 She also has intermittent iron deficiency anemia and sickle cell trait. She received Retacrit today for Hgb 10.4. BP stable at 130/78.  Iron studies are pending. We will replace if needed.  We will plan to see her again in 4 weeks.  She will contact our office with any questions or concerns.   Laverna Peace, NP 7/14/20213:46 PM

## 2020-01-17 NOTE — Patient Instructions (Signed)

## 2020-01-18 ENCOUNTER — Telehealth: Payer: Self-pay | Admitting: Family

## 2020-01-18 DIAGNOSIS — M1990 Unspecified osteoarthritis, unspecified site: Secondary | ICD-10-CM | POA: Diagnosis not present

## 2020-01-18 DIAGNOSIS — M5416 Radiculopathy, lumbar region: Secondary | ICD-10-CM | POA: Diagnosis not present

## 2020-01-18 DIAGNOSIS — G8929 Other chronic pain: Secondary | ICD-10-CM | POA: Diagnosis not present

## 2020-01-18 DIAGNOSIS — K5751 Diverticulosis of both small and large intestine without perforation or abscess with bleeding: Secondary | ICD-10-CM | POA: Diagnosis not present

## 2020-01-18 DIAGNOSIS — M069 Rheumatoid arthritis, unspecified: Secondary | ICD-10-CM | POA: Diagnosis not present

## 2020-01-18 DIAGNOSIS — R600 Localized edema: Secondary | ICD-10-CM | POA: Diagnosis not present

## 2020-01-18 DIAGNOSIS — K5000 Crohn's disease of small intestine without complications: Secondary | ICD-10-CM | POA: Diagnosis not present

## 2020-01-18 LAB — IRON AND TIBC
Iron: 50 ug/dL (ref 41–142)
Saturation Ratios: 20 % — ABNORMAL LOW (ref 21–57)
TIBC: 250 ug/dL (ref 236–444)
UIBC: 200 ug/dL (ref 120–384)

## 2020-01-18 LAB — FERRITIN: Ferritin: 2466 ng/mL — ABNORMAL HIGH (ref 11–307)

## 2020-01-18 NOTE — Telephone Encounter (Signed)
No los 7/14

## 2020-01-18 NOTE — Telephone Encounter (Signed)
Called and LMVM for patient regarding iron infusion appt added for 7/19.  I asked that she call back to confirm appt per 7/15 sch msg

## 2020-01-19 DIAGNOSIS — M5416 Radiculopathy, lumbar region: Secondary | ICD-10-CM | POA: Diagnosis not present

## 2020-01-19 DIAGNOSIS — M1990 Unspecified osteoarthritis, unspecified site: Secondary | ICD-10-CM | POA: Diagnosis not present

## 2020-01-19 DIAGNOSIS — K5751 Diverticulosis of both small and large intestine without perforation or abscess with bleeding: Secondary | ICD-10-CM | POA: Diagnosis not present

## 2020-01-19 DIAGNOSIS — G8929 Other chronic pain: Secondary | ICD-10-CM | POA: Diagnosis not present

## 2020-01-19 DIAGNOSIS — M069 Rheumatoid arthritis, unspecified: Secondary | ICD-10-CM | POA: Diagnosis not present

## 2020-01-19 DIAGNOSIS — K5000 Crohn's disease of small intestine without complications: Secondary | ICD-10-CM | POA: Diagnosis not present

## 2020-01-22 ENCOUNTER — Other Ambulatory Visit: Payer: Self-pay | Admitting: Family

## 2020-01-22 ENCOUNTER — Other Ambulatory Visit: Payer: Self-pay

## 2020-01-22 ENCOUNTER — Inpatient Hospital Stay: Payer: Medicare Other

## 2020-01-22 VITALS — BP 130/61 | HR 75 | Temp 98.6°F | Resp 18

## 2020-01-22 DIAGNOSIS — K909 Intestinal malabsorption, unspecified: Secondary | ICD-10-CM

## 2020-01-22 DIAGNOSIS — N189 Chronic kidney disease, unspecified: Secondary | ICD-10-CM | POA: Diagnosis not present

## 2020-01-22 DIAGNOSIS — Z7952 Long term (current) use of systemic steroids: Secondary | ICD-10-CM | POA: Diagnosis not present

## 2020-01-22 DIAGNOSIS — D638 Anemia in other chronic diseases classified elsewhere: Secondary | ICD-10-CM | POA: Diagnosis not present

## 2020-01-22 DIAGNOSIS — D509 Iron deficiency anemia, unspecified: Secondary | ICD-10-CM | POA: Diagnosis not present

## 2020-01-22 DIAGNOSIS — Z8572 Personal history of non-Hodgkin lymphomas: Secondary | ICD-10-CM | POA: Diagnosis not present

## 2020-01-22 DIAGNOSIS — D573 Sickle-cell trait: Secondary | ICD-10-CM | POA: Diagnosis not present

## 2020-01-22 DIAGNOSIS — N289 Disorder of kidney and ureter, unspecified: Secondary | ICD-10-CM | POA: Diagnosis not present

## 2020-01-22 DIAGNOSIS — D500 Iron deficiency anemia secondary to blood loss (chronic): Secondary | ICD-10-CM

## 2020-01-22 MED ORDER — SODIUM CHLORIDE 0.9 % IV SOLN
510.0000 mg | Freq: Once | INTRAVENOUS | Status: AC
  Administered 2020-01-22: 510 mg via INTRAVENOUS
  Filled 2020-01-22: qty 17

## 2020-01-22 MED ORDER — SODIUM CHLORIDE 0.9 % IV SOLN
Freq: Once | INTRAVENOUS | Status: AC
  Filled 2020-01-22: qty 250

## 2020-01-22 NOTE — Patient Instructions (Signed)

## 2020-01-24 DIAGNOSIS — M5416 Radiculopathy, lumbar region: Secondary | ICD-10-CM | POA: Diagnosis not present

## 2020-01-24 DIAGNOSIS — K5751 Diverticulosis of both small and large intestine without perforation or abscess with bleeding: Secondary | ICD-10-CM | POA: Diagnosis not present

## 2020-01-24 DIAGNOSIS — M069 Rheumatoid arthritis, unspecified: Secondary | ICD-10-CM | POA: Diagnosis not present

## 2020-01-24 DIAGNOSIS — M1990 Unspecified osteoarthritis, unspecified site: Secondary | ICD-10-CM | POA: Diagnosis not present

## 2020-01-24 DIAGNOSIS — K5000 Crohn's disease of small intestine without complications: Secondary | ICD-10-CM | POA: Diagnosis not present

## 2020-01-24 DIAGNOSIS — G8929 Other chronic pain: Secondary | ICD-10-CM | POA: Diagnosis not present

## 2020-01-25 DIAGNOSIS — G8929 Other chronic pain: Secondary | ICD-10-CM | POA: Diagnosis not present

## 2020-01-25 DIAGNOSIS — K5751 Diverticulosis of both small and large intestine without perforation or abscess with bleeding: Secondary | ICD-10-CM | POA: Diagnosis not present

## 2020-01-25 DIAGNOSIS — K5000 Crohn's disease of small intestine without complications: Secondary | ICD-10-CM | POA: Diagnosis not present

## 2020-01-25 DIAGNOSIS — M5416 Radiculopathy, lumbar region: Secondary | ICD-10-CM | POA: Diagnosis not present

## 2020-01-25 DIAGNOSIS — M069 Rheumatoid arthritis, unspecified: Secondary | ICD-10-CM | POA: Diagnosis not present

## 2020-01-25 DIAGNOSIS — M1990 Unspecified osteoarthritis, unspecified site: Secondary | ICD-10-CM | POA: Diagnosis not present

## 2020-01-26 DIAGNOSIS — M069 Rheumatoid arthritis, unspecified: Secondary | ICD-10-CM | POA: Diagnosis not present

## 2020-01-26 DIAGNOSIS — G8929 Other chronic pain: Secondary | ICD-10-CM | POA: Diagnosis not present

## 2020-01-26 DIAGNOSIS — M5416 Radiculopathy, lumbar region: Secondary | ICD-10-CM | POA: Diagnosis not present

## 2020-01-26 DIAGNOSIS — M1990 Unspecified osteoarthritis, unspecified site: Secondary | ICD-10-CM | POA: Diagnosis not present

## 2020-01-26 DIAGNOSIS — K5000 Crohn's disease of small intestine without complications: Secondary | ICD-10-CM | POA: Diagnosis not present

## 2020-01-26 DIAGNOSIS — K5751 Diverticulosis of both small and large intestine without perforation or abscess with bleeding: Secondary | ICD-10-CM | POA: Diagnosis not present

## 2020-01-29 DIAGNOSIS — M1990 Unspecified osteoarthritis, unspecified site: Secondary | ICD-10-CM | POA: Diagnosis not present

## 2020-01-29 DIAGNOSIS — K5751 Diverticulosis of both small and large intestine without perforation or abscess with bleeding: Secondary | ICD-10-CM | POA: Diagnosis not present

## 2020-01-29 DIAGNOSIS — M069 Rheumatoid arthritis, unspecified: Secondary | ICD-10-CM | POA: Diagnosis not present

## 2020-01-29 DIAGNOSIS — M5416 Radiculopathy, lumbar region: Secondary | ICD-10-CM | POA: Diagnosis not present

## 2020-01-30 DIAGNOSIS — M069 Rheumatoid arthritis, unspecified: Secondary | ICD-10-CM | POA: Diagnosis not present

## 2020-01-30 DIAGNOSIS — M5416 Radiculopathy, lumbar region: Secondary | ICD-10-CM | POA: Diagnosis not present

## 2020-01-30 DIAGNOSIS — G8929 Other chronic pain: Secondary | ICD-10-CM | POA: Diagnosis not present

## 2020-01-30 DIAGNOSIS — M1990 Unspecified osteoarthritis, unspecified site: Secondary | ICD-10-CM | POA: Diagnosis not present

## 2020-01-30 DIAGNOSIS — K5751 Diverticulosis of both small and large intestine without perforation or abscess with bleeding: Secondary | ICD-10-CM | POA: Diagnosis not present

## 2020-01-30 DIAGNOSIS — K5000 Crohn's disease of small intestine without complications: Secondary | ICD-10-CM | POA: Diagnosis not present

## 2020-01-31 DIAGNOSIS — Z6841 Body Mass Index (BMI) 40.0 and over, adult: Secondary | ICD-10-CM | POA: Diagnosis not present

## 2020-01-31 DIAGNOSIS — M069 Rheumatoid arthritis, unspecified: Secondary | ICD-10-CM | POA: Diagnosis not present

## 2020-01-31 DIAGNOSIS — G8929 Other chronic pain: Secondary | ICD-10-CM | POA: Diagnosis not present

## 2020-01-31 DIAGNOSIS — N183 Chronic kidney disease, stage 3 unspecified: Secondary | ICD-10-CM | POA: Diagnosis not present

## 2020-01-31 DIAGNOSIS — M5416 Radiculopathy, lumbar region: Secondary | ICD-10-CM | POA: Diagnosis not present

## 2020-01-31 DIAGNOSIS — K5751 Diverticulosis of both small and large intestine without perforation or abscess with bleeding: Secondary | ICD-10-CM | POA: Diagnosis not present

## 2020-01-31 DIAGNOSIS — I129 Hypertensive chronic kidney disease with stage 1 through stage 4 chronic kidney disease, or unspecified chronic kidney disease: Secondary | ICD-10-CM | POA: Diagnosis not present

## 2020-01-31 DIAGNOSIS — D631 Anemia in chronic kidney disease: Secondary | ICD-10-CM | POA: Diagnosis not present

## 2020-01-31 DIAGNOSIS — K219 Gastro-esophageal reflux disease without esophagitis: Secondary | ICD-10-CM | POA: Diagnosis not present

## 2020-01-31 DIAGNOSIS — Z90710 Acquired absence of both cervix and uterus: Secondary | ICD-10-CM | POA: Diagnosis not present

## 2020-01-31 DIAGNOSIS — Z7952 Long term (current) use of systemic steroids: Secondary | ICD-10-CM | POA: Diagnosis not present

## 2020-01-31 DIAGNOSIS — K5000 Crohn's disease of small intestine without complications: Secondary | ICD-10-CM | POA: Diagnosis not present

## 2020-01-31 DIAGNOSIS — Z79891 Long term (current) use of opiate analgesic: Secondary | ICD-10-CM | POA: Diagnosis not present

## 2020-01-31 DIAGNOSIS — Z9181 History of falling: Secondary | ICD-10-CM | POA: Diagnosis not present

## 2020-01-31 DIAGNOSIS — M1990 Unspecified osteoarthritis, unspecified site: Secondary | ICD-10-CM | POA: Diagnosis not present

## 2020-01-31 DIAGNOSIS — R739 Hyperglycemia, unspecified: Secondary | ICD-10-CM | POA: Diagnosis not present

## 2020-02-01 DIAGNOSIS — K5000 Crohn's disease of small intestine without complications: Secondary | ICD-10-CM | POA: Diagnosis not present

## 2020-02-01 DIAGNOSIS — K5751 Diverticulosis of both small and large intestine without perforation or abscess with bleeding: Secondary | ICD-10-CM | POA: Diagnosis not present

## 2020-02-01 DIAGNOSIS — M069 Rheumatoid arthritis, unspecified: Secondary | ICD-10-CM | POA: Diagnosis not present

## 2020-02-01 DIAGNOSIS — M1990 Unspecified osteoarthritis, unspecified site: Secondary | ICD-10-CM | POA: Diagnosis not present

## 2020-02-01 DIAGNOSIS — G8929 Other chronic pain: Secondary | ICD-10-CM | POA: Diagnosis not present

## 2020-02-01 DIAGNOSIS — M5416 Radiculopathy, lumbar region: Secondary | ICD-10-CM | POA: Diagnosis not present

## 2020-02-02 DIAGNOSIS — K5751 Diverticulosis of both small and large intestine without perforation or abscess with bleeding: Secondary | ICD-10-CM | POA: Diagnosis not present

## 2020-02-02 DIAGNOSIS — M069 Rheumatoid arthritis, unspecified: Secondary | ICD-10-CM | POA: Diagnosis not present

## 2020-02-02 DIAGNOSIS — M5416 Radiculopathy, lumbar region: Secondary | ICD-10-CM | POA: Diagnosis not present

## 2020-02-02 DIAGNOSIS — M1990 Unspecified osteoarthritis, unspecified site: Secondary | ICD-10-CM | POA: Diagnosis not present

## 2020-02-02 DIAGNOSIS — K5000 Crohn's disease of small intestine without complications: Secondary | ICD-10-CM | POA: Diagnosis not present

## 2020-02-02 DIAGNOSIS — G8929 Other chronic pain: Secondary | ICD-10-CM | POA: Diagnosis not present

## 2020-02-05 DIAGNOSIS — G8929 Other chronic pain: Secondary | ICD-10-CM | POA: Diagnosis not present

## 2020-02-05 DIAGNOSIS — K5751 Diverticulosis of both small and large intestine without perforation or abscess with bleeding: Secondary | ICD-10-CM | POA: Diagnosis not present

## 2020-02-05 DIAGNOSIS — M5416 Radiculopathy, lumbar region: Secondary | ICD-10-CM | POA: Diagnosis not present

## 2020-02-05 DIAGNOSIS — M1990 Unspecified osteoarthritis, unspecified site: Secondary | ICD-10-CM | POA: Diagnosis not present

## 2020-02-05 DIAGNOSIS — M069 Rheumatoid arthritis, unspecified: Secondary | ICD-10-CM | POA: Diagnosis not present

## 2020-02-05 DIAGNOSIS — K5000 Crohn's disease of small intestine without complications: Secondary | ICD-10-CM | POA: Diagnosis not present

## 2020-02-06 DIAGNOSIS — M25571 Pain in right ankle and joints of right foot: Secondary | ICD-10-CM | POA: Diagnosis not present

## 2020-02-06 DIAGNOSIS — M65871 Other synovitis and tenosynovitis, right ankle and foot: Secondary | ICD-10-CM | POA: Diagnosis not present

## 2020-02-07 DIAGNOSIS — M0579 Rheumatoid arthritis with rheumatoid factor of multiple sites without organ or systems involvement: Secondary | ICD-10-CM | POA: Diagnosis not present

## 2020-02-09 DIAGNOSIS — M5416 Radiculopathy, lumbar region: Secondary | ICD-10-CM | POA: Diagnosis not present

## 2020-02-09 DIAGNOSIS — G8929 Other chronic pain: Secondary | ICD-10-CM | POA: Diagnosis not present

## 2020-02-09 DIAGNOSIS — M069 Rheumatoid arthritis, unspecified: Secondary | ICD-10-CM | POA: Diagnosis not present

## 2020-02-09 DIAGNOSIS — M1990 Unspecified osteoarthritis, unspecified site: Secondary | ICD-10-CM | POA: Diagnosis not present

## 2020-02-09 DIAGNOSIS — K5751 Diverticulosis of both small and large intestine without perforation or abscess with bleeding: Secondary | ICD-10-CM | POA: Diagnosis not present

## 2020-02-09 DIAGNOSIS — K5000 Crohn's disease of small intestine without complications: Secondary | ICD-10-CM | POA: Diagnosis not present

## 2020-02-14 DIAGNOSIS — K5751 Diverticulosis of both small and large intestine without perforation or abscess with bleeding: Secondary | ICD-10-CM | POA: Diagnosis not present

## 2020-02-14 DIAGNOSIS — M069 Rheumatoid arthritis, unspecified: Secondary | ICD-10-CM | POA: Diagnosis not present

## 2020-02-14 DIAGNOSIS — G8929 Other chronic pain: Secondary | ICD-10-CM | POA: Diagnosis not present

## 2020-02-14 DIAGNOSIS — M1990 Unspecified osteoarthritis, unspecified site: Secondary | ICD-10-CM | POA: Diagnosis not present

## 2020-02-14 DIAGNOSIS — K5000 Crohn's disease of small intestine without complications: Secondary | ICD-10-CM | POA: Diagnosis not present

## 2020-02-14 DIAGNOSIS — M5416 Radiculopathy, lumbar region: Secondary | ICD-10-CM | POA: Diagnosis not present

## 2020-02-19 ENCOUNTER — Other Ambulatory Visit: Payer: Self-pay

## 2020-02-19 ENCOUNTER — Inpatient Hospital Stay: Payer: Medicare Other | Attending: Hematology & Oncology

## 2020-02-19 ENCOUNTER — Inpatient Hospital Stay: Payer: Medicare Other

## 2020-02-19 ENCOUNTER — Encounter: Payer: Self-pay | Admitting: Family

## 2020-02-19 ENCOUNTER — Inpatient Hospital Stay (HOSPITAL_BASED_OUTPATIENT_CLINIC_OR_DEPARTMENT_OTHER): Payer: Medicare Other | Admitting: Family

## 2020-02-19 VITALS — BP 116/79 | HR 88 | Temp 97.8°F | Resp 20 | Ht 60.0 in | Wt 218.0 lb

## 2020-02-19 DIAGNOSIS — Z8572 Personal history of non-Hodgkin lymphomas: Secondary | ICD-10-CM | POA: Diagnosis not present

## 2020-02-19 DIAGNOSIS — D509 Iron deficiency anemia, unspecified: Secondary | ICD-10-CM | POA: Diagnosis not present

## 2020-02-19 DIAGNOSIS — K909 Intestinal malabsorption, unspecified: Secondary | ICD-10-CM

## 2020-02-19 DIAGNOSIS — E611 Iron deficiency: Secondary | ICD-10-CM | POA: Insufficient documentation

## 2020-02-19 DIAGNOSIS — R531 Weakness: Secondary | ICD-10-CM | POA: Diagnosis not present

## 2020-02-19 DIAGNOSIS — R200 Anesthesia of skin: Secondary | ICD-10-CM | POA: Insufficient documentation

## 2020-02-19 DIAGNOSIS — R5383 Other fatigue: Secondary | ICD-10-CM | POA: Insufficient documentation

## 2020-02-19 DIAGNOSIS — D631 Anemia in chronic kidney disease: Secondary | ICD-10-CM

## 2020-02-19 DIAGNOSIS — D500 Iron deficiency anemia secondary to blood loss (chronic): Secondary | ICD-10-CM

## 2020-02-19 DIAGNOSIS — N189 Chronic kidney disease, unspecified: Secondary | ICD-10-CM | POA: Diagnosis not present

## 2020-02-19 DIAGNOSIS — R202 Paresthesia of skin: Secondary | ICD-10-CM | POA: Diagnosis not present

## 2020-02-19 DIAGNOSIS — D638 Anemia in other chronic diseases classified elsewhere: Secondary | ICD-10-CM | POA: Diagnosis not present

## 2020-02-19 DIAGNOSIS — Z79899 Other long term (current) drug therapy: Secondary | ICD-10-CM | POA: Diagnosis not present

## 2020-02-19 DIAGNOSIS — N183 Chronic kidney disease, stage 3 unspecified: Secondary | ICD-10-CM

## 2020-02-19 DIAGNOSIS — D573 Sickle-cell trait: Secondary | ICD-10-CM | POA: Diagnosis not present

## 2020-02-19 DIAGNOSIS — N289 Disorder of kidney and ureter, unspecified: Secondary | ICD-10-CM | POA: Diagnosis present

## 2020-02-19 LAB — CBC WITH DIFFERENTIAL (CANCER CENTER ONLY)
Abs Immature Granulocytes: 0.09 10*3/uL — ABNORMAL HIGH (ref 0.00–0.07)
Basophils Absolute: 0 10*3/uL (ref 0.0–0.1)
Basophils Relative: 0 %
Eosinophils Absolute: 0 10*3/uL (ref 0.0–0.5)
Eosinophils Relative: 0 %
HCT: 30.8 % — ABNORMAL LOW (ref 36.0–46.0)
Hemoglobin: 9.7 g/dL — ABNORMAL LOW (ref 12.0–15.0)
Immature Granulocytes: 2 %
Lymphocytes Relative: 37 %
Lymphs Abs: 2.1 10*3/uL (ref 0.7–4.0)
MCH: 35 pg — ABNORMAL HIGH (ref 26.0–34.0)
MCHC: 31.5 g/dL (ref 30.0–36.0)
MCV: 111.2 fL — ABNORMAL HIGH (ref 80.0–100.0)
Monocytes Absolute: 0.5 10*3/uL (ref 0.1–1.0)
Monocytes Relative: 9 %
Neutro Abs: 3 10*3/uL (ref 1.7–7.7)
Neutrophils Relative %: 52 %
Platelet Count: 132 10*3/uL — ABNORMAL LOW (ref 150–400)
RBC: 2.77 MIL/uL — ABNORMAL LOW (ref 3.87–5.11)
RDW: 16.1 % — ABNORMAL HIGH (ref 11.5–15.5)
WBC Count: 5.6 10*3/uL (ref 4.0–10.5)
nRBC: 0.4 % — ABNORMAL HIGH (ref 0.0–0.2)

## 2020-02-19 LAB — CMP (CANCER CENTER ONLY)
ALT: 20 U/L (ref 0–44)
AST: 20 U/L (ref 15–41)
Albumin: 4 g/dL (ref 3.5–5.0)
Alkaline Phosphatase: 56 U/L (ref 38–126)
Anion gap: 9 (ref 5–15)
BUN: 27 mg/dL — ABNORMAL HIGH (ref 8–23)
CO2: 26 mmol/L (ref 22–32)
Calcium: 9.9 mg/dL (ref 8.9–10.3)
Chloride: 108 mmol/L (ref 98–111)
Creatinine: 1.53 mg/dL — ABNORMAL HIGH (ref 0.44–1.00)
GFR, Est AFR Am: 40 mL/min — ABNORMAL LOW (ref >60)
GFR, Estimated: 34 mL/min — ABNORMAL LOW (ref >60)
Glucose, Bld: 121 mg/dL — ABNORMAL HIGH (ref 70–99)
Potassium: 4.2 mmol/L (ref 3.5–5.1)
Sodium: 143 mmol/L (ref 135–145)
Total Bilirubin: 0.4 mg/dL (ref 0.3–1.2)
Total Protein: 6.2 g/dL — ABNORMAL LOW (ref 6.5–8.1)

## 2020-02-19 LAB — IRON AND TIBC
Iron: 151 ug/dL — ABNORMAL HIGH (ref 41–142)
Saturation Ratios: 60 % — ABNORMAL HIGH (ref 21–57)
TIBC: 250 ug/dL (ref 236–444)
UIBC: 99 ug/dL — ABNORMAL LOW (ref 120–384)

## 2020-02-19 LAB — RETICULOCYTES
Immature Retic Fract: 29.2 % — ABNORMAL HIGH (ref 2.3–15.9)
RBC.: 2.75 MIL/uL — ABNORMAL LOW (ref 3.87–5.11)
Retic Count, Absolute: 64.6 10*3/uL (ref 19.0–186.0)
Retic Ct Pct: 2.4 % (ref 0.4–3.1)

## 2020-02-19 LAB — FERRITIN: Ferritin: 3684 ng/mL — ABNORMAL HIGH (ref 11–307)

## 2020-02-19 MED ORDER — EPOETIN ALFA-EPBX 40000 UNIT/ML IJ SOLN
40000.0000 [IU] | Freq: Once | INTRAMUSCULAR | Status: AC
  Administered 2020-02-19: 40000 [IU] via SUBCUTANEOUS

## 2020-02-19 MED ORDER — EPOETIN ALFA-EPBX 40000 UNIT/ML IJ SOLN
INTRAMUSCULAR | Status: AC
  Filled 2020-02-19: qty 1

## 2020-02-19 NOTE — Patient Instructions (Signed)

## 2020-02-19 NOTE — Progress Notes (Signed)
Hematology and Oncology Follow Up Visit  Helen Hardy [id number] [birth year] 69 y.o. 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 is here today for follow-up. She is still feeling fatigued and weak at times. She is doing PT/OT at home a couple days a week which she feels is helping.  She is able to ambulate again with a cane and denies any falls or syncopal episodes.  No episodes of bleeding. No bruising or petechiae.  Hgb is 9.7, MCV 111.  No fever, chills, n/v, cough, rash, dizziness, SOB, chest pain, palpitations, abdominal pain or changes in bowel or bladder habits.  She has puffiness in her feet and ankles that comes and goes. This is unchanged.  The numbness and tingling in her fingertips has improved with PT. She has several exercises she does while sitting which seem to have helped.  She states that she is eating well and hydrating properly. Her weight is stable.   ECOG Performance Status: 1 - Symptomatic but completely ambulatory  Medications:  Allergies as of 02/19/2020      Reactions   Oxycodone Nausea And Vomiting   Tramadol Other (See Comments)   UNKNOWN REACTION      Medication List       Accurate as of February 19, 2020 12:05 PM. If you have any questions, ask your nurse or doctor.        STOP taking these medications   oxyCODONE-acetaminophen 10-325 MG tablet Commonly known as: PERCOCET Stopped by: Helen Peace, NP     TAKE these medications   estradiol 1 MG tablet Commonly known as: ESTRACE Take 1 mg by mouth daily.   folic acid 1 MG tablet Commonly known as: FOLVITE Take 2 mg by mouth daily.   furosemide 40 MG tablet Commonly known as: LASIX Take 40  mg by mouth daily.   gabapentin 300 MG capsule Commonly known as: NEURONTIN Take 300-600 mg by mouth See admin instructions. 300 mg in the morning and at 2 pm. 600 mg every night   HYDROcodone-acetaminophen 10-325 MG tablet Commonly known as: NORCO Take 1 tablet by mouth every 8 (eight) hours as needed for moderate pain or severe pain.   leflunomide 20 MG tablet Commonly known as: ARAVA Take 20 mg by mouth daily.   methocarbamol 500 MG tablet Commonly known as: ROBAXIN Take 750 mg by mouth in the morning and at bedtime.   multivitamin tablet Take 1 tablet by mouth daily.   olmesartan 20 MG tablet Commonly known as: BENICAR Take 20 mg by mouth daily.   ondansetron 4 MG tablet Commonly known as: ZOFRAN Take 4 mg by mouth every 8 (eight) hours as needed for nausea or vomiting.   pantoprazole 40 MG tablet Commonly known as: PROTONIX Take 40 mg by mouth daily.   Potassium Chloride ER 20 MEQ Tbcr Take 20 mEq by mouth daily.   predniSONE 10 MG tablet Commonly known as: DELTASONE Take 10 mg by mouth daily.   triamcinolone cream 0.1 % Commonly known as: KENALOG Apply 1 application topically as needed (for rash).   vitamin B-12 1000 MCG tablet Commonly known as: CYANOCOBALAMIN Take 1,000 mcg by mouth daily.   vitamin C 1000 MG tablet Take 1,000 mg  by mouth daily.   Vitamin D3 25 MCG (1000 UT) Caps Take 2,000 Units by mouth daily.       Allergies:  Allergies  Allergen Reactions  . Oxycodone Nausea And Vomiting  . Tramadol Other (See Comments)    UNKNOWN REACTION    Past Medical History, Surgical history, Social history, and Family History were reviewed and updated.  Review of Systems: All other 10 point review of systems is negative.   Physical Exam:  height is 5' (1.524 m) and weight is 218 lb 0.6 oz (98.9 kg). Her oral temperature is 97.8 F (36.6 C). Her blood pressure is 116/79 and her pulse is 88. Her respiration is 20 and oxygen saturation is 100%.     Wt Readings from Last 3 Encounters:  02/19/20 218 lb 0.6 oz (98.9 kg)  01/17/20 217 lb 1.9 oz (98.5 kg)  12/25/19 224 lb 6.4 oz (101.8 kg)    Ocular: Sclerae unicteric, pupils equal, round and reactive to light Ear-nose-throat: Oropharynx clear, dentition fair Lymphatic: No cervical or supraclavicular adenopathy Lungs no rales or rhonchi, good excursion bilaterally Heart regular rate and rhythm, no murmur appreciated Abd soft, nontender, positive bowel sounds, no liver or spleen tip palpated on exam, no fluid wave  MSK no focal spinal tenderness, no joint edema Neuro: non-focal, well-oriented, appropriate affect Breasts: Deferred   Lab Results  Component Value Date   WBC 5.6 02/19/2020   HGB 9.7 (L) 02/19/2020   HCT 30.8 (L) 02/19/2020   MCV 111.2 (H) 02/19/2020   PLT 132 (L) 02/19/2020   Lab Results  Component Value Date   FERRITIN 2,466 (H) 01/17/2020   IRON 50 01/17/2020   TIBC 250 01/17/2020   UIBC 200 01/17/2020   IRONPCTSAT 20 (L) 01/17/2020   Lab Results  Component Value Date   RETICCTPCT 2.4 02/19/2020   RBC 2.75 (L) 02/19/2020   RETICCTABS 41.0 06/21/2015   No results found for: KPAFRELGTCHN, LAMBDASER, KAPLAMBRATIO No results found for: IGGSERUM, IGA, IGMSERUM No results found for: Odetta Pink, SPEI   Chemistry      Component Value Date/Time   NA 143 02/19/2020 1030   NA 144 04/29/2017 0929   K 4.2 02/19/2020 1030   K 4.0 04/29/2017 0929   CL 108 02/19/2020 1030   CL 103 06/21/2015 0800   CO2 26 02/19/2020 1030   CO2 25 04/29/2017 0929   BUN 27 (H) 02/19/2020 1030   BUN 17.7 04/29/2017 0929   CREATININE 1.53 (H) 02/19/2020 1030   CREATININE 0.8 04/29/2017 0929      Component Value Date/Time   CALCIUM 9.9 02/19/2020 1030   CALCIUM 9.3 04/29/2017 0929   ALKPHOS 56 02/19/2020 1030   ALKPHOS 55 04/29/2017 0929   AST 20 02/19/2020 1030   AST 19 04/29/2017 0929   ALT 20 02/19/2020 1030    ALT 16 04/29/2017 0929   BILITOT 0.4 02/19/2020 1030   BILITOT 0.43 04/29/2017 0929       Impression and Plan: [name] is a very pleasant 69yo African American female with 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 She also has intermittent iron deficiency anemia and sickle cell trait. She received Retacrit today for Hgb 9.7.  We will see what her iron looks like and replace if needed.  We wills ee her again in a month.  She can contact our office with any questions or concerns.   Helen Peace, NP 8/16/202112:05 PM

## 2020-02-20 DIAGNOSIS — K5751 Diverticulosis of both small and large intestine without perforation or abscess with bleeding: Secondary | ICD-10-CM | POA: Diagnosis not present

## 2020-02-20 DIAGNOSIS — M5416 Radiculopathy, lumbar region: Secondary | ICD-10-CM | POA: Diagnosis not present

## 2020-02-20 DIAGNOSIS — G8929 Other chronic pain: Secondary | ICD-10-CM | POA: Diagnosis not present

## 2020-02-20 DIAGNOSIS — K5000 Crohn's disease of small intestine without complications: Secondary | ICD-10-CM | POA: Diagnosis not present

## 2020-02-20 DIAGNOSIS — M1990 Unspecified osteoarthritis, unspecified site: Secondary | ICD-10-CM | POA: Diagnosis not present

## 2020-02-20 DIAGNOSIS — M069 Rheumatoid arthritis, unspecified: Secondary | ICD-10-CM | POA: Diagnosis not present

## 2020-02-23 DIAGNOSIS — G8929 Other chronic pain: Secondary | ICD-10-CM | POA: Diagnosis not present

## 2020-02-23 DIAGNOSIS — M1990 Unspecified osteoarthritis, unspecified site: Secondary | ICD-10-CM | POA: Diagnosis not present

## 2020-02-23 DIAGNOSIS — M069 Rheumatoid arthritis, unspecified: Secondary | ICD-10-CM | POA: Diagnosis not present

## 2020-02-23 DIAGNOSIS — M5416 Radiculopathy, lumbar region: Secondary | ICD-10-CM | POA: Diagnosis not present

## 2020-02-23 DIAGNOSIS — K5751 Diverticulosis of both small and large intestine without perforation or abscess with bleeding: Secondary | ICD-10-CM | POA: Diagnosis not present

## 2020-02-23 DIAGNOSIS — K5000 Crohn's disease of small intestine without complications: Secondary | ICD-10-CM | POA: Diagnosis not present

## 2020-02-26 DIAGNOSIS — G8929 Other chronic pain: Secondary | ICD-10-CM | POA: Diagnosis not present

## 2020-02-26 DIAGNOSIS — M5416 Radiculopathy, lumbar region: Secondary | ICD-10-CM | POA: Diagnosis not present

## 2020-02-26 DIAGNOSIS — M1990 Unspecified osteoarthritis, unspecified site: Secondary | ICD-10-CM | POA: Diagnosis not present

## 2020-02-26 DIAGNOSIS — M069 Rheumatoid arthritis, unspecified: Secondary | ICD-10-CM | POA: Diagnosis not present

## 2020-02-26 DIAGNOSIS — K5751 Diverticulosis of both small and large intestine without perforation or abscess with bleeding: Secondary | ICD-10-CM | POA: Diagnosis not present

## 2020-02-26 DIAGNOSIS — K5000 Crohn's disease of small intestine without complications: Secondary | ICD-10-CM | POA: Diagnosis not present

## 2020-02-28 DIAGNOSIS — G8929 Other chronic pain: Secondary | ICD-10-CM | POA: Diagnosis not present

## 2020-02-28 DIAGNOSIS — K5000 Crohn's disease of small intestine without complications: Secondary | ICD-10-CM | POA: Diagnosis not present

## 2020-02-28 DIAGNOSIS — M1990 Unspecified osteoarthritis, unspecified site: Secondary | ICD-10-CM | POA: Diagnosis not present

## 2020-02-28 DIAGNOSIS — K5751 Diverticulosis of both small and large intestine without perforation or abscess with bleeding: Secondary | ICD-10-CM | POA: Diagnosis not present

## 2020-02-28 DIAGNOSIS — M069 Rheumatoid arthritis, unspecified: Secondary | ICD-10-CM | POA: Diagnosis not present

## 2020-02-28 DIAGNOSIS — M5416 Radiculopathy, lumbar region: Secondary | ICD-10-CM | POA: Diagnosis not present

## 2020-02-29 DIAGNOSIS — M25571 Pain in right ankle and joints of right foot: Secondary | ICD-10-CM | POA: Diagnosis not present

## 2020-02-29 DIAGNOSIS — M65871 Other synovitis and tenosynovitis, right ankle and foot: Secondary | ICD-10-CM | POA: Diagnosis not present

## 2020-03-01 DIAGNOSIS — I129 Hypertensive chronic kidney disease with stage 1 through stage 4 chronic kidney disease, or unspecified chronic kidney disease: Secondary | ICD-10-CM | POA: Diagnosis not present

## 2020-03-01 DIAGNOSIS — K5751 Diverticulosis of both small and large intestine without perforation or abscess with bleeding: Secondary | ICD-10-CM | POA: Diagnosis not present

## 2020-03-01 DIAGNOSIS — M5416 Radiculopathy, lumbar region: Secondary | ICD-10-CM | POA: Diagnosis not present

## 2020-03-01 DIAGNOSIS — Z9181 History of falling: Secondary | ICD-10-CM | POA: Diagnosis not present

## 2020-03-01 DIAGNOSIS — K219 Gastro-esophageal reflux disease without esophagitis: Secondary | ICD-10-CM | POA: Diagnosis not present

## 2020-03-01 DIAGNOSIS — R739 Hyperglycemia, unspecified: Secondary | ICD-10-CM | POA: Diagnosis not present

## 2020-03-01 DIAGNOSIS — Z79891 Long term (current) use of opiate analgesic: Secondary | ICD-10-CM | POA: Diagnosis not present

## 2020-03-01 DIAGNOSIS — Z7952 Long term (current) use of systemic steroids: Secondary | ICD-10-CM | POA: Diagnosis not present

## 2020-03-01 DIAGNOSIS — D631 Anemia in chronic kidney disease: Secondary | ICD-10-CM | POA: Diagnosis not present

## 2020-03-01 DIAGNOSIS — M1990 Unspecified osteoarthritis, unspecified site: Secondary | ICD-10-CM | POA: Diagnosis not present

## 2020-03-01 DIAGNOSIS — G8929 Other chronic pain: Secondary | ICD-10-CM | POA: Diagnosis not present

## 2020-03-01 DIAGNOSIS — M069 Rheumatoid arthritis, unspecified: Secondary | ICD-10-CM | POA: Diagnosis not present

## 2020-03-01 DIAGNOSIS — Z6841 Body Mass Index (BMI) 40.0 and over, adult: Secondary | ICD-10-CM | POA: Diagnosis not present

## 2020-03-01 DIAGNOSIS — Z90710 Acquired absence of both cervix and uterus: Secondary | ICD-10-CM | POA: Diagnosis not present

## 2020-03-01 DIAGNOSIS — N183 Chronic kidney disease, stage 3 unspecified: Secondary | ICD-10-CM | POA: Diagnosis not present

## 2020-03-01 DIAGNOSIS — K5000 Crohn's disease of small intestine without complications: Secondary | ICD-10-CM | POA: Diagnosis not present

## 2020-03-06 DIAGNOSIS — G8929 Other chronic pain: Secondary | ICD-10-CM | POA: Diagnosis not present

## 2020-03-06 DIAGNOSIS — M0579 Rheumatoid arthritis with rheumatoid factor of multiple sites without organ or systems involvement: Secondary | ICD-10-CM | POA: Diagnosis not present

## 2020-03-06 DIAGNOSIS — M5416 Radiculopathy, lumbar region: Secondary | ICD-10-CM | POA: Diagnosis not present

## 2020-03-06 DIAGNOSIS — K5000 Crohn's disease of small intestine without complications: Secondary | ICD-10-CM | POA: Diagnosis not present

## 2020-03-06 DIAGNOSIS — K5751 Diverticulosis of both small and large intestine without perforation or abscess with bleeding: Secondary | ICD-10-CM | POA: Diagnosis not present

## 2020-03-06 DIAGNOSIS — M1990 Unspecified osteoarthritis, unspecified site: Secondary | ICD-10-CM | POA: Diagnosis not present

## 2020-03-06 DIAGNOSIS — M069 Rheumatoid arthritis, unspecified: Secondary | ICD-10-CM | POA: Diagnosis not present

## 2020-03-12 DIAGNOSIS — M5416 Radiculopathy, lumbar region: Secondary | ICD-10-CM | POA: Diagnosis not present

## 2020-03-12 DIAGNOSIS — M1990 Unspecified osteoarthritis, unspecified site: Secondary | ICD-10-CM | POA: Diagnosis not present

## 2020-03-12 DIAGNOSIS — K5751 Diverticulosis of both small and large intestine without perforation or abscess with bleeding: Secondary | ICD-10-CM | POA: Diagnosis not present

## 2020-03-12 DIAGNOSIS — M069 Rheumatoid arthritis, unspecified: Secondary | ICD-10-CM | POA: Diagnosis not present

## 2020-03-12 DIAGNOSIS — K5000 Crohn's disease of small intestine without complications: Secondary | ICD-10-CM | POA: Diagnosis not present

## 2020-03-12 DIAGNOSIS — G8929 Other chronic pain: Secondary | ICD-10-CM | POA: Diagnosis not present

## 2020-03-14 DIAGNOSIS — M25571 Pain in right ankle and joints of right foot: Secondary | ICD-10-CM | POA: Diagnosis not present

## 2020-03-14 DIAGNOSIS — M65871 Other synovitis and tenosynovitis, right ankle and foot: Secondary | ICD-10-CM | POA: Diagnosis not present

## 2020-03-19 ENCOUNTER — Encounter: Payer: Self-pay | Admitting: Family

## 2020-03-19 ENCOUNTER — Inpatient Hospital Stay: Payer: Medicare Other

## 2020-03-19 ENCOUNTER — Telehealth: Payer: Self-pay | Admitting: Family

## 2020-03-19 ENCOUNTER — Other Ambulatory Visit: Payer: Self-pay

## 2020-03-19 ENCOUNTER — Inpatient Hospital Stay: Payer: Medicare Other | Attending: Hematology & Oncology | Admitting: Family

## 2020-03-19 VITALS — BP 108/62 | HR 77 | Temp 98.1°F | Resp 18 | Wt 221.0 lb

## 2020-03-19 DIAGNOSIS — D631 Anemia in chronic kidney disease: Secondary | ICD-10-CM

## 2020-03-19 DIAGNOSIS — D500 Iron deficiency anemia secondary to blood loss (chronic): Secondary | ICD-10-CM

## 2020-03-19 DIAGNOSIS — D538 Other specified nutritional anemias: Secondary | ICD-10-CM | POA: Diagnosis not present

## 2020-03-19 DIAGNOSIS — N183 Chronic kidney disease, stage 3 unspecified: Secondary | ICD-10-CM

## 2020-03-19 DIAGNOSIS — K909 Intestinal malabsorption, unspecified: Secondary | ICD-10-CM

## 2020-03-19 DIAGNOSIS — N289 Disorder of kidney and ureter, unspecified: Secondary | ICD-10-CM | POA: Diagnosis not present

## 2020-03-19 DIAGNOSIS — Z8572 Personal history of non-Hodgkin lymphomas: Secondary | ICD-10-CM | POA: Insufficient documentation

## 2020-03-19 DIAGNOSIS — D638 Anemia in other chronic diseases classified elsewhere: Secondary | ICD-10-CM | POA: Diagnosis present

## 2020-03-19 LAB — CBC WITH DIFFERENTIAL (CANCER CENTER ONLY)
Abs Immature Granulocytes: 0.1 10*3/uL — ABNORMAL HIGH (ref 0.00–0.07)
Basophils Absolute: 0 10*3/uL (ref 0.0–0.1)
Basophils Relative: 0 %
Eosinophils Absolute: 0 10*3/uL (ref 0.0–0.5)
Eosinophils Relative: 0 %
HCT: 30.9 % — ABNORMAL LOW (ref 36.0–46.0)
Hemoglobin: 10 g/dL — ABNORMAL LOW (ref 12.0–15.0)
Immature Granulocytes: 2 %
Lymphocytes Relative: 39 %
Lymphs Abs: 2.2 10*3/uL (ref 0.7–4.0)
MCH: 36.1 pg — ABNORMAL HIGH (ref 26.0–34.0)
MCHC: 32.4 g/dL (ref 30.0–36.0)
MCV: 111.6 fL — ABNORMAL HIGH (ref 80.0–100.0)
Monocytes Absolute: 0.5 10*3/uL (ref 0.1–1.0)
Monocytes Relative: 9 %
Neutro Abs: 2.8 10*3/uL (ref 1.7–7.7)
Neutrophils Relative %: 50 %
Platelet Count: 125 10*3/uL — ABNORMAL LOW (ref 150–400)
RBC: 2.77 MIL/uL — ABNORMAL LOW (ref 3.87–5.11)
RDW: 16.9 % — ABNORMAL HIGH (ref 11.5–15.5)
WBC Count: 5.7 10*3/uL (ref 4.0–10.5)
nRBC: 0 % (ref 0.0–0.2)

## 2020-03-19 LAB — CMP (CANCER CENTER ONLY)
ALT: 20 U/L (ref 0–44)
AST: 21 U/L (ref 15–41)
Albumin: 3.9 g/dL (ref 3.5–5.0)
Alkaline Phosphatase: 52 U/L (ref 38–126)
Anion gap: 8 (ref 5–15)
BUN: 31 mg/dL — ABNORMAL HIGH (ref 8–23)
CO2: 26 mmol/L (ref 22–32)
Calcium: 9.9 mg/dL (ref 8.9–10.3)
Chloride: 109 mmol/L (ref 98–111)
Creatinine: 1.34 mg/dL — ABNORMAL HIGH (ref 0.44–1.00)
GFR, Est AFR Am: 46 mL/min — ABNORMAL LOW (ref >60)
GFR, Estimated: 40 mL/min — ABNORMAL LOW (ref >60)
Glucose, Bld: 96 mg/dL (ref 70–99)
Potassium: 4.9 mmol/L (ref 3.5–5.1)
Sodium: 143 mmol/L (ref 135–145)
Total Bilirubin: 0.4 mg/dL (ref 0.3–1.2)
Total Protein: 6.2 g/dL — ABNORMAL LOW (ref 6.5–8.1)

## 2020-03-19 LAB — RETICULOCYTES
Immature Retic Fract: 24.8 % — ABNORMAL HIGH (ref 2.3–15.9)
RBC.: 2.72 MIL/uL — ABNORMAL LOW (ref 3.87–5.11)
Retic Count, Absolute: 60.4 10*3/uL (ref 19.0–186.0)
Retic Ct Pct: 2.2 % (ref 0.4–3.1)

## 2020-03-19 LAB — IRON AND TIBC
Iron: 142 ug/dL (ref 41–142)
Saturation Ratios: 54 % (ref 21–57)
TIBC: 265 ug/dL (ref 236–444)
UIBC: 123 ug/dL (ref 120–384)

## 2020-03-19 LAB — FERRITIN: Ferritin: 2601 ng/mL — ABNORMAL HIGH (ref 11–307)

## 2020-03-19 MED ORDER — EPOETIN ALFA-EPBX 40000 UNIT/ML IJ SOLN
40000.0000 [IU] | Freq: Once | INTRAMUSCULAR | Status: AC
  Administered 2020-03-19: 40000 [IU] via SUBCUTANEOUS

## 2020-03-19 NOTE — Patient Instructions (Signed)

## 2020-03-19 NOTE — Progress Notes (Signed)
Hematology and Oncology Follow Up Visit  Helen Hardy [id number] [birth year] 69 y.o. 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 She states that she follows up with Dr. Rolena Infante soon and plans to discuss surgery.  She is still getting injections in the right ankle which she states do help.  She has chronic swelling in that right ankle and she wears a compression stocking daily for added support.  She does get fatigued at times.  She has not noted any episodes of bleeding. No bruising or petechiae.  No fever, chills, n/v, cough, rash, dizziness, SOB, chest pain, palpitations, abdominal pain or changes in bowel or bladder habits.  She ambulates with a cane. No falls or syncopal episodes.  She is eating well and staying well hydrated. Her weight is stable.   ECOG Performance Status: 1 - Symptomatic but completely ambulatory  Medications:  Allergies as of 03/19/2020      Reactions   Oxycodone Nausea And Vomiting   Tramadol Other (See Comments)   UNKNOWN REACTION      Medication List       Accurate as of March 19, 2020 10:45 AM. If you have any questions, ask your nurse or doctor.        estradiol 1 MG tablet Commonly known as: ESTRACE Take 1 mg by mouth daily.   folic acid 1 MG tablet Commonly known as: FOLVITE Take 2 mg by mouth daily.   furosemide 40 MG tablet Commonly known as: LASIX Take 40 mg by mouth daily.   gabapentin 300 MG capsule Commonly known as: NEURONTIN Take 300-600 mg by mouth See admin instructions. 300 mg in  the morning and at 2 pm. 600 mg every night   HYDROcodone-acetaminophen 10-325 MG tablet Commonly known as: NORCO Take 1 tablet by mouth every 8 (eight) hours as needed for moderate pain or severe pain.   leflunomide 20 MG tablet Commonly known as: ARAVA Take 20 mg by mouth daily.   methocarbamol 500 MG tablet Commonly known as: ROBAXIN Take 750 mg by mouth in the morning and at bedtime.   multivitamin tablet Take 1 tablet by mouth daily.   olmesartan 20 MG tablet Commonly known as: BENICAR Take 20 mg by mouth daily.   ondansetron 4 MG tablet Commonly known as: ZOFRAN Take 4 mg by mouth every 8 (eight) hours as needed for nausea or vomiting.   pantoprazole 40 MG tablet Commonly known as: PROTONIX Take 40 mg by mouth daily.   Potassium Chloride ER 20 MEQ Tbcr Take 20 mEq by mouth daily.   predniSONE 10 MG tablet Commonly known as: DELTASONE Take 10 mg by mouth daily.   triamcinolone cream 0.1 % Commonly known as: KENALOG Apply 1 application topically as needed (for rash).   valACYclovir 1000 MG tablet Commonly known as: VALTREX   vitamin B-12 1000 MCG tablet Commonly known as: CYANOCOBALAMIN Take 1,000 mcg by mouth daily.   vitamin C 1000 MG tablet Take 1,000 mg by mouth daily.   Vitamin D3 25 MCG (1000 UT) Caps Take  2,000 Units by mouth daily.       Allergies:  Allergies  Allergen Reactions   Oxycodone Nausea And Vomiting   Tramadol Other (See Comments)    UNKNOWN REACTION    Past Medical History, Surgical history, Social history, and Family History were reviewed and updated.  Review of Systems: All other 10 point review of systems is negative.   Physical Exam:  weight is 221 lb (100.2 kg). Her oral temperature is 98.1 F (36.7 C). Her blood pressure is 108/62 and her pulse is 77. Her respiration is 18 and oxygen saturation is 100%.   Wt Readings from Last 3 Encounters:  03/19/20 221 lb (100.2 kg)  02/19/20 218 lb 0.6 oz (98.9 kg)    01/17/20 217 lb 1.9 oz (98.5 kg)    Ocular: Sclerae unicteric, pupils equal, round and reactive to light Ear-nose-throat: Oropharynx clear, dentition fair Lymphatic: No cervical or supraclavicular adenopathy Lungs no rales or rhonchi, good excursion bilaterally Heart regular rate and rhythm, no murmur appreciated Abd soft, nontender, positive bowel sounds MSK no focal spinal tenderness, no joint edema Neuro: non-focal, well-oriented, appropriate affect Breasts: Deferred   Lab Results  Component Value Date   WBC 5.7 03/19/2020   HGB 10.0 (L) 03/19/2020   HCT 30.9 (L) 03/19/2020   MCV 111.6 (H) 03/19/2020   PLT 125 (L) 03/19/2020   Lab Results  Component Value Date   FERRITIN 3,684 (H) 02/19/2020   IRON 151 (H) 02/19/2020   TIBC 250 02/19/2020   UIBC 99 (L) 02/19/2020   IRONPCTSAT 60 (H) 02/19/2020   Lab Results  Component Value Date   RETICCTPCT 2.2 03/19/2020   RBC 2.77 (L) 03/19/2020   RBC 2.72 (L) 03/19/2020   RETICCTABS 41.0 06/21/2015   No results found for: KPAFRELGTCHN, LAMBDASER, KAPLAMBRATIO No results found for: IGGSERUM, IGA, IGMSERUM No results found for: Odetta Pink, SPEI   Chemistry      Component Value Date/Time   NA 143 02/19/2020 1030   NA 144 04/29/2017 0929   K 4.2 02/19/2020 1030   K 4.0 04/29/2017 0929   CL 108 02/19/2020 1030   CL 103 06/21/2015 0800   CO2 26 02/19/2020 1030   CO2 25 04/29/2017 0929   BUN 27 (H) 02/19/2020 1030   BUN 17.7 04/29/2017 0929   CREATININE 1.53 (H) 02/19/2020 1030   CREATININE 0.8 04/29/2017 0929      Component Value Date/Time   CALCIUM 9.9 02/19/2020 1030   CALCIUM 9.3 04/29/2017 0929   ALKPHOS 56 02/19/2020 1030   ALKPHOS 55 04/29/2017 0929   AST 20 02/19/2020 1030   AST 19 04/29/2017 0929   ALT 20 02/19/2020 1030   ALT 16 04/29/2017 0929   BILITOT 0.4 02/19/2020 1030   BILITOT 0.43 04/29/2017 0929       Impression and Plan: Helen Hardy  is a very pleasant 69yo African American female with 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 She also has intermittent iron deficiency anemia and sickle cell trait. She received Retacrit today for Hgb 10.0.  Iron studies are pending and we will replace if needed.  Follow-up in 1 month.  She can contact our office with any questions or concerns.   Laverna Peace, NP 9/14/202110:45 AM

## 2020-03-19 NOTE — Telephone Encounter (Signed)
Appointments scheduled calendar printed per 9/14 los

## 2020-03-20 DIAGNOSIS — K5000 Crohn's disease of small intestine without complications: Secondary | ICD-10-CM | POA: Diagnosis not present

## 2020-03-20 DIAGNOSIS — G8929 Other chronic pain: Secondary | ICD-10-CM | POA: Diagnosis not present

## 2020-03-20 DIAGNOSIS — M069 Rheumatoid arthritis, unspecified: Secondary | ICD-10-CM | POA: Diagnosis not present

## 2020-03-20 DIAGNOSIS — K5751 Diverticulosis of both small and large intestine without perforation or abscess with bleeding: Secondary | ICD-10-CM | POA: Diagnosis not present

## 2020-03-20 DIAGNOSIS — M5416 Radiculopathy, lumbar region: Secondary | ICD-10-CM | POA: Diagnosis not present

## 2020-03-20 DIAGNOSIS — M1990 Unspecified osteoarthritis, unspecified site: Secondary | ICD-10-CM | POA: Diagnosis not present

## 2020-03-25 ENCOUNTER — Emergency Department (HOSPITAL_COMMUNITY): Payer: Medicare Other

## 2020-03-25 ENCOUNTER — Inpatient Hospital Stay (HOSPITAL_COMMUNITY)
Admission: EM | Admit: 2020-03-25 | Discharge: 2020-04-05 | DRG: 871 | Disposition: A | Discharge status: Home / Self Care | Payer: Medicare Other | Attending: Internal Medicine | Admitting: Internal Medicine

## 2020-03-25 ENCOUNTER — Other Ambulatory Visit: Payer: Self-pay

## 2020-03-25 ENCOUNTER — Encounter (HOSPITAL_COMMUNITY): Payer: Self-pay | Admitting: Internal Medicine

## 2020-03-25 DIAGNOSIS — Z7952 Long term (current) use of systemic steroids: Secondary | ICD-10-CM

## 2020-03-25 DIAGNOSIS — R188 Other ascites: Secondary | ICD-10-CM | POA: Diagnosis not present

## 2020-03-25 DIAGNOSIS — D696 Thrombocytopenia, unspecified: Secondary | ICD-10-CM | POA: Diagnosis present

## 2020-03-25 DIAGNOSIS — K633 Ulcer of intestine: Secondary | ICD-10-CM | POA: Diagnosis not present

## 2020-03-25 DIAGNOSIS — R6510 Systemic inflammatory response syndrome (SIRS) of non-infectious origin without acute organ dysfunction: Secondary | ICD-10-CM

## 2020-03-25 DIAGNOSIS — K5000 Crohn's disease of small intestine without complications: Secondary | ICD-10-CM | POA: Diagnosis not present

## 2020-03-25 DIAGNOSIS — R000 Tachycardia, unspecified: Secondary | ICD-10-CM | POA: Diagnosis not present

## 2020-03-25 DIAGNOSIS — Z96652 Presence of left artificial knee joint: Secondary | ICD-10-CM | POA: Diagnosis present

## 2020-03-25 DIAGNOSIS — Z79899 Other long term (current) drug therapy: Secondary | ICD-10-CM

## 2020-03-25 DIAGNOSIS — K5731 Diverticulosis of large intestine without perforation or abscess with bleeding: Secondary | ICD-10-CM | POA: Diagnosis present

## 2020-03-25 DIAGNOSIS — I129 Hypertensive chronic kidney disease with stage 1 through stage 4 chronic kidney disease, or unspecified chronic kidney disease: Secondary | ICD-10-CM | POA: Diagnosis present

## 2020-03-25 DIAGNOSIS — Z885 Allergy status to narcotic agent status: Secondary | ICD-10-CM

## 2020-03-25 DIAGNOSIS — L89152 Pressure ulcer of sacral region, stage 2: Secondary | ICD-10-CM | POA: Diagnosis present

## 2020-03-25 DIAGNOSIS — Z1211 Encounter for screening for malignant neoplasm of colon: Secondary | ICD-10-CM | POA: Diagnosis not present

## 2020-03-25 DIAGNOSIS — I10 Essential (primary) hypertension: Secondary | ICD-10-CM | POA: Diagnosis not present

## 2020-03-25 DIAGNOSIS — M069 Rheumatoid arthritis, unspecified: Secondary | ICD-10-CM | POA: Diagnosis present

## 2020-03-25 DIAGNOSIS — D631 Anemia in chronic kidney disease: Secondary | ICD-10-CM | POA: Diagnosis not present

## 2020-03-25 DIAGNOSIS — R197 Diarrhea, unspecified: Secondary | ICD-10-CM | POA: Diagnosis not present

## 2020-03-25 DIAGNOSIS — E162 Hypoglycemia, unspecified: Secondary | ICD-10-CM | POA: Diagnosis not present

## 2020-03-25 DIAGNOSIS — Z20822 Contact with and (suspected) exposure to covid-19: Secondary | ICD-10-CM | POA: Diagnosis present

## 2020-03-25 DIAGNOSIS — K529 Noninfective gastroenteritis and colitis, unspecified: Secondary | ICD-10-CM | POA: Diagnosis not present

## 2020-03-25 DIAGNOSIS — K50811 Crohn's disease of both small and large intestine with rectal bleeding: Secondary | ICD-10-CM | POA: Diagnosis not present

## 2020-03-25 DIAGNOSIS — Z9181 History of falling: Secondary | ICD-10-CM | POA: Diagnosis not present

## 2020-03-25 DIAGNOSIS — N183 Chronic kidney disease, stage 3 unspecified: Secondary | ICD-10-CM | POA: Diagnosis not present

## 2020-03-25 DIAGNOSIS — R933 Abnormal findings on diagnostic imaging of other parts of digestive tract: Secondary | ICD-10-CM | POA: Diagnosis not present

## 2020-03-25 DIAGNOSIS — M1990 Unspecified osteoarthritis, unspecified site: Secondary | ICD-10-CM | POA: Diagnosis not present

## 2020-03-25 DIAGNOSIS — E876 Hypokalemia: Secondary | ICD-10-CM | POA: Diagnosis present

## 2020-03-25 DIAGNOSIS — K922 Gastrointestinal hemorrhage, unspecified: Secondary | ICD-10-CM | POA: Diagnosis not present

## 2020-03-25 DIAGNOSIS — R509 Fever, unspecified: Secondary | ICD-10-CM | POA: Diagnosis not present

## 2020-03-25 DIAGNOSIS — K219 Gastro-esophageal reflux disease without esophagitis: Secondary | ICD-10-CM | POA: Diagnosis present

## 2020-03-25 DIAGNOSIS — M5416 Radiculopathy, lumbar region: Secondary | ICD-10-CM | POA: Diagnosis not present

## 2020-03-25 DIAGNOSIS — I517 Cardiomegaly: Secondary | ICD-10-CM | POA: Diagnosis not present

## 2020-03-25 DIAGNOSIS — Z981 Arthrodesis status: Secondary | ICD-10-CM

## 2020-03-25 DIAGNOSIS — R109 Unspecified abdominal pain: Secondary | ICD-10-CM | POA: Diagnosis not present

## 2020-03-25 DIAGNOSIS — R52 Pain, unspecified: Secondary | ICD-10-CM | POA: Diagnosis not present

## 2020-03-25 DIAGNOSIS — A419 Sepsis, unspecified organism: Secondary | ICD-10-CM | POA: Diagnosis present

## 2020-03-25 DIAGNOSIS — R1084 Generalized abdominal pain: Secondary | ICD-10-CM | POA: Diagnosis not present

## 2020-03-25 DIAGNOSIS — K50111 Crohn's disease of large intestine with rectal bleeding: Secondary | ICD-10-CM | POA: Diagnosis present

## 2020-03-25 DIAGNOSIS — Z90710 Acquired absence of both cervix and uterus: Secondary | ICD-10-CM | POA: Diagnosis not present

## 2020-03-25 DIAGNOSIS — Z8572 Personal history of non-Hodgkin lymphomas: Secondary | ICD-10-CM | POA: Diagnosis not present

## 2020-03-25 DIAGNOSIS — K559 Vascular disorder of intestine, unspecified: Secondary | ICD-10-CM | POA: Diagnosis present

## 2020-03-25 DIAGNOSIS — K5730 Diverticulosis of large intestine without perforation or abscess without bleeding: Secondary | ICD-10-CM | POA: Diagnosis not present

## 2020-03-25 DIAGNOSIS — R0602 Shortness of breath: Secondary | ICD-10-CM | POA: Diagnosis not present

## 2020-03-25 DIAGNOSIS — Z6841 Body Mass Index (BMI) 40.0 and over, adult: Secondary | ICD-10-CM | POA: Diagnosis not present

## 2020-03-25 DIAGNOSIS — L89302 Pressure ulcer of unspecified buttock, stage 2: Secondary | ICD-10-CM | POA: Diagnosis present

## 2020-03-25 DIAGNOSIS — Z79891 Long term (current) use of opiate analgesic: Secondary | ICD-10-CM | POA: Diagnosis not present

## 2020-03-25 DIAGNOSIS — K449 Diaphragmatic hernia without obstruction or gangrene: Secondary | ICD-10-CM | POA: Diagnosis present

## 2020-03-25 DIAGNOSIS — N179 Acute kidney failure, unspecified: Secondary | ICD-10-CM | POA: Diagnosis present

## 2020-03-25 DIAGNOSIS — R739 Hyperglycemia, unspecified: Secondary | ICD-10-CM | POA: Diagnosis not present

## 2020-03-25 DIAGNOSIS — D509 Iron deficiency anemia, unspecified: Secondary | ICD-10-CM | POA: Diagnosis present

## 2020-03-25 DIAGNOSIS — K625 Hemorrhage of anus and rectum: Secondary | ICD-10-CM | POA: Diagnosis not present

## 2020-03-25 DIAGNOSIS — G8929 Other chronic pain: Secondary | ICD-10-CM | POA: Diagnosis present

## 2020-03-25 DIAGNOSIS — K5751 Diverticulosis of both small and large intestine without perforation or abscess with bleeding: Secondary | ICD-10-CM | POA: Diagnosis not present

## 2020-03-25 LAB — COMPREHENSIVE METABOLIC PANEL
ALT: 25 U/L (ref 0–44)
AST: 30 U/L (ref 15–41)
Albumin: 3.7 g/dL (ref 3.5–5.0)
Alkaline Phosphatase: 56 U/L (ref 38–126)
Anion gap: 11 (ref 5–15)
BUN: 29 mg/dL — ABNORMAL HIGH (ref 8–23)
CO2: 23 mmol/L (ref 22–32)
Calcium: 8.9 mg/dL (ref 8.9–10.3)
Chloride: 103 mmol/L (ref 98–111)
Creatinine, Ser: 1.37 mg/dL — ABNORMAL HIGH (ref 0.44–1.00)
GFR calc Af Amer: 45 mL/min — ABNORMAL LOW (ref >60)
GFR calc non Af Amer: 39 mL/min — ABNORMAL LOW (ref >60)
Glucose, Bld: 115 mg/dL — ABNORMAL HIGH (ref 70–99)
Potassium: 4.2 mmol/L (ref 3.5–5.1)
Sodium: 137 mmol/L (ref 135–145)
Total Bilirubin: 0.5 mg/dL (ref 0.3–1.2)
Total Protein: 6.5 g/dL (ref 6.5–8.1)

## 2020-03-25 LAB — URINALYSIS, ROUTINE W REFLEX MICROSCOPIC
Bilirubin Urine: NEGATIVE
Glucose, UA: NEGATIVE mg/dL
Hgb urine dipstick: NEGATIVE
Ketones, ur: NEGATIVE mg/dL
Leukocytes,Ua: NEGATIVE
Nitrite: NEGATIVE
Protein, ur: NEGATIVE mg/dL
Specific Gravity, Urine: >1.046 — ABNORMAL HIGH (ref 1.005–1.030)
pH: 5 (ref 5.0–8.0)

## 2020-03-25 LAB — I-STAT CHEM 8, ED
BUN: 28 mg/dL — ABNORMAL HIGH (ref 8–23)
Calcium, Ion: 0.88 mmol/L — CL (ref 1.15–1.40)
Chloride: 109 mmol/L (ref 98–111)
Creatinine, Ser: 1.4 mg/dL — ABNORMAL HIGH (ref 0.44–1.00)
Glucose, Bld: 112 mg/dL — ABNORMAL HIGH (ref 70–99)
HCT: 30 % — ABNORMAL LOW (ref 36.0–46.0)
Hemoglobin: 10.2 g/dL — ABNORMAL LOW (ref 12.0–15.0)
Potassium: 4.2 mmol/L (ref 3.5–5.1)
Sodium: 140 mmol/L (ref 135–145)
TCO2: 25 mmol/L (ref 22–32)

## 2020-03-25 LAB — CBC WITH DIFFERENTIAL/PLATELET
Abs Immature Granulocytes: 0.04 10*3/uL (ref 0.00–0.07)
Basophils Absolute: 0 10*3/uL (ref 0.0–0.1)
Basophils Relative: 0 %
Eosinophils Absolute: 0 10*3/uL (ref 0.0–0.5)
Eosinophils Relative: 0 %
HCT: 32.9 % — ABNORMAL LOW (ref 36.0–46.0)
Hemoglobin: 10.5 g/dL — ABNORMAL LOW (ref 12.0–15.0)
Immature Granulocytes: 1 %
Lymphocytes Relative: 40 %
Lymphs Abs: 2.3 10*3/uL (ref 0.7–4.0)
MCH: 37.2 pg — ABNORMAL HIGH (ref 26.0–34.0)
MCHC: 31.9 g/dL (ref 30.0–36.0)
MCV: 116.7 fL — ABNORMAL HIGH (ref 80.0–100.0)
Monocytes Absolute: 0.4 10*3/uL (ref 0.1–1.0)
Monocytes Relative: 7 %
Neutro Abs: 3 10*3/uL (ref 1.7–7.7)
Neutrophils Relative %: 52 %
Platelets: 132 10*3/uL — ABNORMAL LOW (ref 150–400)
RBC: 2.82 MIL/uL — ABNORMAL LOW (ref 3.87–5.11)
RDW: 17.9 % — ABNORMAL HIGH (ref 11.5–15.5)
WBC: 5.8 10*3/uL (ref 4.0–10.5)
nRBC: 2.3 % — ABNORMAL HIGH (ref 0.0–0.2)

## 2020-03-25 LAB — SARS CORONAVIRUS 2 BY RT PCR (HOSPITAL ORDER, PERFORMED IN ~~LOC~~ HOSPITAL LAB): SARS Coronavirus 2: NEGATIVE

## 2020-03-25 LAB — TYPE AND SCREEN
ABO/RH(D): O POS
Antibody Screen: NEGATIVE

## 2020-03-25 LAB — POC OCCULT BLOOD, ED: Fecal Occult Bld: POSITIVE — AB

## 2020-03-25 LAB — LACTIC ACID, PLASMA: Lactic Acid, Venous: 1.6 mmol/L (ref 0.5–1.9)

## 2020-03-25 LAB — HIV ANTIBODY (ROUTINE TESTING W REFLEX): HIV Screen 4th Generation wRfx: NONREACTIVE

## 2020-03-25 LAB — MAGNESIUM: Magnesium: 2.2 mg/dL (ref 1.7–2.4)

## 2020-03-25 LAB — PHOSPHORUS: Phosphorus: 2.2 mg/dL — ABNORMAL LOW (ref 2.5–4.6)

## 2020-03-25 MED ORDER — ACETAMINOPHEN 500 MG PO TABS
1000.0000 mg | ORAL_TABLET | Freq: Once | ORAL | Status: AC
  Administered 2020-03-25: 1000 mg via ORAL
  Filled 2020-03-25: qty 2

## 2020-03-25 MED ORDER — PANTOPRAZOLE SODIUM 40 MG PO TBEC
40.0000 mg | DELAYED_RELEASE_TABLET | Freq: Every day | ORAL | Status: DC
  Administered 2020-03-25 – 2020-04-05 (×12): 40 mg via ORAL
  Filled 2020-03-25 (×11): qty 1

## 2020-03-25 MED ORDER — METRONIDAZOLE IN NACL 5-0.79 MG/ML-% IV SOLN
500.0000 mg | Freq: Three times a day (TID) | INTRAVENOUS | Status: DC
  Administered 2020-03-25 – 2020-03-28 (×9): 500 mg via INTRAVENOUS
  Filled 2020-03-25 (×9): qty 100

## 2020-03-25 MED ORDER — CALCIUM GLUCONATE-NACL 1-0.675 GM/50ML-% IV SOLN: 1.0000 g | Freq: Once | INTRAVENOUS | Status: DC

## 2020-03-25 MED ORDER — SODIUM CHLORIDE 0.9% FLUSH
3.0000 mL | Freq: Two times a day (BID) | INTRAVENOUS | Status: DC
  Administered 2020-03-25 – 2020-04-04 (×11): 3 mL via INTRAVENOUS

## 2020-03-25 MED ORDER — LACTATED RINGERS IV BOLUS
1000.0000 mL | Freq: Once | INTRAVENOUS | Status: AC
  Administered 2020-03-25: 1000 mL via INTRAVENOUS

## 2020-03-25 MED ORDER — GABAPENTIN 300 MG PO CAPS
300.0000 mg | ORAL_CAPSULE | Freq: Every day | ORAL | Status: DC
  Administered 2020-03-26 – 2020-04-05 (×11): 300 mg via ORAL
  Filled 2020-03-25 (×11): qty 1

## 2020-03-25 MED ORDER — ONDANSETRON HCL 4 MG/2ML IJ SOLN
4.0000 mg | Freq: Four times a day (QID) | INTRAMUSCULAR | Status: DC | PRN
  Administered 2020-03-26 – 2020-04-05 (×18): 4 mg via INTRAVENOUS
  Filled 2020-03-25 (×18): qty 2

## 2020-03-25 MED ORDER — METRONIDAZOLE IN NACL 5-0.79 MG/ML-% IV SOLN
500.0000 mg | Freq: Once | INTRAVENOUS | Status: AC
  Administered 2020-03-25: 500 mg via INTRAVENOUS
  Filled 2020-03-25: qty 100

## 2020-03-25 MED ORDER — VITAMIN B-12 1000 MCG PO TABS
1000.0000 ug | ORAL_TABLET | Freq: Every day | ORAL | Status: DC
  Administered 2020-03-25 – 2020-04-05 (×12): 1000 ug via ORAL
  Filled 2020-03-25 (×12): qty 1

## 2020-03-25 MED ORDER — HYDROCODONE-ACETAMINOPHEN 10-325 MG PO TABS
1.0000 | ORAL_TABLET | Freq: Three times a day (TID) | ORAL | Status: DC | PRN
  Administered 2020-03-26 – 2020-04-03 (×21): 1 via ORAL
  Filled 2020-03-25 (×22): qty 1

## 2020-03-25 MED ORDER — ONDANSETRON HCL 4 MG PO TABS
4.0000 mg | ORAL_TABLET | Freq: Four times a day (QID) | ORAL | Status: DC | PRN
  Administered 2020-03-26 – 2020-03-31 (×5): 4 mg via ORAL
  Filled 2020-03-25 (×5): qty 1

## 2020-03-25 MED ORDER — FOLIC ACID 1 MG PO TABS
2.0000 mg | ORAL_TABLET | Freq: Every day | ORAL | Status: DC
  Administered 2020-03-25 – 2020-04-05 (×12): 2 mg via ORAL
  Filled 2020-03-25 (×12): qty 2

## 2020-03-25 MED ORDER — IOHEXOL 300 MG/ML  SOLN
100.0000 mL | Freq: Once | INTRAMUSCULAR | Status: AC | PRN
  Administered 2020-03-25: 100 mL via INTRAVENOUS

## 2020-03-25 MED ORDER — METOPROLOL TARTRATE 5 MG/5ML IV SOLN: 5.0000 mg | Freq: Four times a day (QID) | INTRAVENOUS | Status: DC | PRN

## 2020-03-25 MED ORDER — MAGNESIUM SULFATE IN D5W 1-5 GM/100ML-% IV SOLN: 1.0000 g | Freq: Once | INTRAVENOUS | Status: DC

## 2020-03-25 MED ORDER — CIPROFLOXACIN IN D5W 400 MG/200ML IV SOLN
400.0000 mg | Freq: Two times a day (BID) | INTRAVENOUS | Status: DC
  Administered 2020-03-25 – 2020-03-29 (×8): 400 mg via INTRAVENOUS
  Filled 2020-03-25 (×8): qty 200

## 2020-03-25 MED ORDER — GABAPENTIN 300 MG PO CAPS
600.0000 mg | ORAL_CAPSULE | Freq: Every day | ORAL | Status: DC
  Administered 2020-03-25 – 2020-04-04 (×11): 600 mg via ORAL
  Filled 2020-03-25 (×11): qty 2

## 2020-03-25 MED ORDER — ESTRADIOL 1 MG PO TABS
1.0000 mg | ORAL_TABLET | Freq: Every day | ORAL | Status: DC
  Administered 2020-03-25 – 2020-04-05 (×12): 1 mg via ORAL
  Filled 2020-03-25 (×13): qty 1

## 2020-03-25 MED ORDER — CIPROFLOXACIN IN D5W 400 MG/200ML IV SOLN
400.0000 mg | Freq: Once | INTRAVENOUS | Status: AC
  Administered 2020-03-25: 400 mg via INTRAVENOUS
  Filled 2020-03-25: qty 200

## 2020-03-25 MED ORDER — ACETAMINOPHEN 325 MG PO TABS
650.0000 mg | ORAL_TABLET | Freq: Four times a day (QID) | ORAL | Status: DC | PRN
  Administered 2020-03-25 (×2): 650 mg via ORAL
  Administered 2020-03-26: 325 mg via ORAL
  Administered 2020-03-27 – 2020-04-02 (×11): 650 mg via ORAL
  Filled 2020-03-25 (×14): qty 2

## 2020-03-25 MED ORDER — GABAPENTIN 300 MG PO CAPS: 300.0000 mg | ORAL_CAPSULE | ORAL | Status: DC

## 2020-03-25 MED ORDER — SODIUM CHLORIDE 0.9 % IV SOLN
2.0000 g | Freq: Once | INTRAVENOUS | Status: AC
  Administered 2020-03-25: 2 g via INTRAVENOUS
  Filled 2020-03-25: qty 20

## 2020-03-25 MED ORDER — ACETAMINOPHEN 650 MG RE SUPP: 650.0000 mg | Freq: Four times a day (QID) | RECTAL | Status: DC | PRN

## 2020-03-25 NOTE — ED Triage Notes (Signed)
Patient BIB EMS from home with complaint of 5-7 episodes of bright red blood in stools. Patient has history of same, was admitted in June for same. Patient also complains of abdominal pain in lower quadrants but states past history of same. Patient is AxOx4.

## 2020-03-25 NOTE — ED Notes (Signed)
Pt placed on purewick at 60 mmHg.

## 2020-03-25 NOTE — Progress Notes (Signed)
Called ED to get report on patient. Nurse to call back with report

## 2020-03-25 NOTE — ED Notes (Signed)
Patient back from CT.

## 2020-03-25 NOTE — ED Provider Notes (Signed)
[facility] DEPT Provider Note   CSN: [id number] Arrival date & time: 03/25/20  0548     History No chief complaint on file.   Helen Hardy is a 69 y.o. female.  HPI Helen Hardy is a 69 y.o. female with medical history significant of non-Hodgkin's lymphoma (dx in 2004) in remission, rheumatoid arthritis since age 46, chronic pain, hypertension, GERD, osteoarthritis, lumbar radiculopathy, morbid obesity, anemia of chronic disease presented to the ED due to two days of constant moderate lower abdominal pain.  Patient states she is experiencing associated decreased appetite, mild nausea without vomiting, fatigue.  She also notes some blood when wiping after bowel movements.  Denies any bright red blood in the toilet bowl.  She reports an episode of diarrhea this morning but denies any other episodes of diarrhea.  Patient notes a history of diverticulitis earlier this year for which she was admitted.  States that the symptoms feel similar.  Denies fevers, chills, chest pain, shortness of breath, urinary changes, syncope.  Patient has been vaccinated for COVID-19.    Past Medical History:  Diagnosis Date  . Anemia   . Anemia of chronic renal failure, stage 3 (moderate) 12/30/2018  . Arthritis    Rheumatoid  . Cancer ()    Non-Hodgkins Lymphoma  . GERD (gastroesophageal reflux disease)   . Hypertension   . 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 Numbness and tingling of right leg   . Pneumonia     Patient Active Problem List   Diagnosis Date Noted  . 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 Rheumatoid arthritis ([place]) 12/25/2019  . GERD (gastroesophageal reflux disease) 12/25/2019  . Essential hypertension 12/25/2019  . AKI (acute kidney injury) ([place]) 12/25/2019  . Hyperglycemia 12/25/2019  . Chronic radicular lumbar pain 12/25/2019  . Generalized weakness 12/25/2019  . Anemia of chronic renal failure, stage 3  (moderate) 12/30/2018  . 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 Iron deficiency anemia 06/21/2015  . 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 Morbid obesity ([place]) 04/10/2015  . 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 S/P knee replacement 04/08/2015    Past Surgical History:  Procedure Laterality Date  . 2012 Right big toe and toe beside - had pins in toes    . ABDOMINAL HYSTERECTOMY    . bladder tack - 2005    . EYE SURGERY Bilateral    cataract  . JOINT REPLACEMENT     both knee replacement  . LUMBAR LAMINECTOMY/DECOMPRESSION MICRODISCECTOMY N/A 03/04/2016   Procedure: RIGHT FAR LATERAL DISCECTOMY L5-S1;  Surgeon: Melina Schools, MD;  Location: [facility];  Service: Orthopedics;  Laterality: N/A;  . ROTATOR CUFF REPAIR Left   . TOTAL KNEE REVISION Left 04/08/2015   Procedure: REVISION LEFT TOTAL KNEE ;  Surgeon: Paralee Cancel, MD;  Location: [facility];  Service: Orthopedics;  Laterality: Left;     OB History   No obstetric history on file.     No family history on file.  Social History   Tobacco Use  . Smoking status: Never Smoker  . Smokeless tobacco: Never Used  Vaping Use  . Vaping Use: Never used  Substance Use Topics  . Alcohol use: No    Alcohol/week: 0.0 standard drinks  . Drug use: No    Home Medications Prior to Admission medications   Medication Sig Start Date End Date Taking? Authorizing Provider  Ascorbic Acid (VITAMIN C) 1000 MG tablet Take 1,000 mg by mouth daily.  [provider]  Cholecalciferol (VITAMIN D3) 1000 units CAPS Take 2,000 Units by mouth daily.     [provider]  estradiol (ESTRACE) 1 MG tablet Take 1 mg by mouth daily.  12/25/12   [provider]  folic acid (FOLVITE) 1 MG tablet Take 2 mg by mouth daily.     [provider]  furosemide (LASIX) 40 MG tablet Take 40 mg by mouth daily.  12/01/18   [provider]  gabapentin (NEURONTIN) 300 MG capsule Take 300-600 mg by mouth See admin instructions. 300 mg in the  morning and at 2 pm. 600 mg every night 03/17/18   [provider]  HYDROcodone-acetaminophen (NORCO) 10-325 MG tablet Take 1 tablet by mouth every 8 (eight) hours as needed for moderate pain or severe pain.  12/16/18   [provider]  leflunomide (ARAVA) 20 MG tablet Take 20 mg by mouth daily.    [provider]  methocarbamol (ROBAXIN) 500 MG tablet Take 750 mg by mouth in the morning and at bedtime.  03/11/18   [provider]  Multiple Vitamin (MULTIVITAMIN) tablet Take 1 tablet by mouth daily.    [provider]  olmesartan (BENICAR) 20 MG tablet Take 20 mg by mouth daily. 12/27/18   [provider]  ondansetron (ZOFRAN) 4 MG tablet Take 4 mg by mouth every 8 (eight) hours as needed for nausea or vomiting.  04/10/19   [provider]  pantoprazole (PROTONIX) 40 MG tablet Take 40 mg by mouth daily.     [provider]  Potassium Chloride ER 20 MEQ TBCR Take 20 mEq by mouth daily.  12/22/18   [provider]  predniSONE (DELTASONE) 10 MG tablet Take 10 mg by mouth daily. 04/12/18   [provider]  triamcinolone cream (KENALOG) 0.1 % Apply 1 application topically as needed (for rash).     [provider]  valACYclovir (VALTREX) 1000 MG tablet  03/12/20   [provider]  vitamin B-12 (CYANOCOBALAMIN) 1000 MCG tablet Take 1,000 mcg by mouth daily.    [provider]    Allergies    Oxycodone and Tramadol  Review of Systems   Review of Systems  All other systems reviewed and are negative. Ten systems reviewed and are negative for acute change, except as noted in the HPI.    Physical Exam Updated Vital Signs BP (!) 142/76 (BP Location: Left Arm)   Pulse 97   Temp (!) 103 F (39.4 C) (Oral)   Resp (!) 24   SpO2 98%   Physical Exam Vitals and nursing note reviewed.  Constitutional:      General: She is in acute distress.     Appearance: Normal appearance. She is obese. She is  ill-appearing. She is not toxic-appearing or diaphoretic.  HENT:     Head: Normocephalic and atraumatic.     Right Ear: External ear normal.     Left Ear: External ear normal.     Nose: Nose normal.     Mouth/Throat:     Mouth: Mucous membranes are moist.     Pharynx: Oropharynx is clear. No oropharyngeal exudate or posterior oropharyngeal erythema.  Eyes:     Extraocular Movements: Extraocular movements intact.  Cardiovascular:     Rate and Rhythm: Regular rhythm. Tachycardia present.     Pulses: Normal pulses.     Heart sounds: Normal heart sounds. No murmur heard.  No friction rub. No gallop.      Comments:  Mild tachycardia around 105-110.  No murmurs, rubs, gallops. Pulmonary:     Effort: Pulmonary effort is normal. No respiratory distress.     Breath sounds: Normal breath sounds. No stridor. No wheezing, rhonchi or rales.     Comments: Lungs are clear to auscultation bilaterally. Abdominal:     General: Abdomen is flat.     Palpations: Abdomen is soft.     Tenderness: There is abdominal tenderness.     Comments: Abdomen is soft.  Moderate tenderness noted over the suprapubic region and left lower quadrant.  Difficult to assess abdomen secondary to body habitus.  Genitourinary:    Comments: Female nursing chaperone present.  Small amount of loose brown stool with traces of bright red blood noted around the anus.  No tenderness noted throughout the rectal exam.  No signs of internal or external hemorrhoids.  Additional loose brown stool with traces of bright red blood noted in the rectal vault. Musculoskeletal:        General: Normal range of motion.     Cervical back: Normal range of motion and neck supple. No tenderness.     Right lower leg: No edema.     Left lower leg: No edema.     Comments: No lower extremity edema.  Palpable pedal pulses.  Skin:    General: Skin is warm and dry.  Neurological:     General: No focal deficit present.     Mental Status: She is alert and  oriented to person, place, and time.  Psychiatric:        Mood and Affect: Mood normal.        Behavior: Behavior normal.    ED Results / Procedures / Treatments   Labs (all labs ordered are listed, but only abnormal results are displayed) Labs Reviewed  COMPREHENSIVE METABOLIC PANEL - Abnormal; Notable for the following components:      Result Value   Glucose, Bld 115 (*)    BUN 29 (*)    Creatinine, Ser 1.37 (*)    GFR calc non Af Amer 39 (*)    GFR calc Af Amer 45 (*)    All other components within normal limits  CBC WITH DIFFERENTIAL/PLATELET - Abnormal; Notable for the following components:   RBC 2.82 (*)    Hemoglobin 10.5 (*)    HCT 32.9 (*)    MCV 116.7 (*)    MCH 37.2 (*)    RDW 17.9 (*)    Platelets 132 (*)    nRBC 2.3 (*)    All other components within normal limits  I-STAT CHEM 8, ED - Abnormal; Notable for the following components:   BUN 28 (*)    Creatinine, Ser 1.40 (*)    Glucose, Bld 112 (*)    Calcium, Ion 0.88 (*)    Hemoglobin 10.2 (*)    HCT 30.0 (*)    All other components within normal limits  POC OCCULT BLOOD, ED - Abnormal; Notable for the following components:   Fecal Occult Bld POSITIVE (*)    All other components within normal limits  SARS CORONAVIRUS 2 BY RT PCR (HOSPITAL ORDER, [facility])  CULTURE, BLOOD (ROUTINE X 2)  CULTURE, BLOOD (ROUTINE X 2)  LACTIC ACID, PLASMA  OCCULT BLOOD X 1 CARD TO LAB, STOOL  MAGNESIUM  PHOSPHORUS  TYPE AND SCREEN   EKG EKG Interpretation  Date/Time:  Monday March 25 2020 07:38:40 EDT Ventricular Rate:  115 PR Interval:  QRS Duration: 73 QT Interval:  363 QTC Calculation: 503 R Axis:   29 Text Interpretation: Sinus tachycardia Ventricular premature complex Aberrant conduction of SV complex(es) Probable left atrial enlargement Borderline T abnormalities, inferior leads Prolonged QT interval Artifact in lead(s) I III aVR aVL Confirmed by Gerlene Fee [phone]) on  03/25/2020 8:59:14 AM  Radiology CT ABDOMEN PELVIS W CONTRAST  Result Date: 03/25/2020 CLINICAL DATA:  Left lower quadrant abdominal pain, history of diverticulitis, febrile EXAM: CT ABDOMEN AND PELVIS WITH CONTRAST TECHNIQUE: Multidetector CT imaging of the abdomen and pelvis was performed using the standard protocol following bolus administration of intravenous contrast. CONTRAST:  148m OMNIPAQUE IOHEXOL 300 MG/ML  SOLN COMPARISON:  12/25/2019 FINDINGS: Lower chest: No acute abnormality. Hepatobiliary: No solid liver abnormality is seen. No gallstones, gallbladder wall thickening, or biliary dilatation. Pancreas: Unremarkable. No pancreatic ductal dilatation or surrounding inflammatory changes. Spleen: Normal in size without significant abnormality. Adrenals/Urinary Tract: Adrenal glands are unremarkable. Kidneys are normal, without renal calculi, solid lesion, or hydronephrosis. Bladder is unremarkable. Stomach/Bowel: Stomach is within normal limits. Appendix appears normal. There is segmental wall thickening and fat stranding of the transverse colon (series 2, image 29) as well as the base of the cecum (series 4, image 46). There are occasional cecal diverticula as well as sigmoid diverticula, however these do not appear to be nidus of inflammation. Vascular/Lymphatic: No significant vascular findings are present. No enlarged abdominal or pelvic lymph nodes. Reproductive: Status post hysterectomy. Other: No abdominal wall hernia or abnormality. Small volume free fluid in the low pelvis. Musculoskeletal: No acute or significant osseous findings. IMPRESSION: 1. There is segmental wall thickening and fat stranding of the transverse colon as well as the base of the cecum. Findings are consistent with nonspecific infectious, inflammatory, or ischemic colitis, a pattern of recurrent segmental cecal and transverse colon involvement with skip lesions favoring Crohn's disease. 2. Normal appendix. 3. Cecal and  sigmoid diverticula, which do not however appear to be the underlying etiology of inflammation. 4. Small volume free fluid in the low pelvis, likely reactive. 5. Status post hysterectomy. Electronically Signed   By: AEddie CandleM.D.   On: 03/25/2020 08:53   DG Chest Portable 1 View  Result Date: 03/25/2020 CLINICAL DATA:  Fever and shortness of breath EXAM: PORTABLE CHEST 1 VIEW COMPARISON:  July 17, 2016 FINDINGS: Lungs are clear. Heart is borderline enlarged with pulmonary vascularity normal. No adenopathy. No bone lesions. IMPRESSION: No edema or airspace opacity.  Mild cardiac enlargement. Electronically Signed   By: WLowella GripIII M.D.   On: 03/25/2020 07:54    Procedures Procedures   Medications Ordered in ED Medications  ciprofloxacin (CIPRO) IVPB 400 mg (has no administration in time range)  lactated ringers bolus 1,000 mL ( Intravenous Rate/Dose Verify 03/25/20 0938)  acetaminophen (TYLENOL) tablet 1,000 mg (1,000 mg Oral Given 03/25/20 0728)  cefTRIAXone (ROCEPHIN) 2 g in sodium chloride 0.9 % 100 mL IVPB ( Intravenous Stopped 03/25/20 0800)    And  metroNIDAZOLE (FLAGYL) IVPB 500 mg ( Intravenous Stopped 03/25/20 0936)  iohexol (OMNIPAQUE) 300 MG/ML solution 100 mL (100 mLs Intravenous Contrast Given 03/25/20 0818)   ED Course  I have reviewed the triage vital signs and the nursing notes.  Pertinent labs & imaging results that were available during my care of the patient were reviewed by me and considered in my medical decision making (see chart for details).  Clinical Course as of Mar 25 1005  Mon Mar 25, 2020  04970Lactic  Acid, Venous: 1.6 [LJ]  0734 Similar to baseline values  Hemoglobin(!): 10.5 [LJ]  0735 Rectal  Temp(!): 102.2 F (39 C) [LJ]  0756 SARS Coronavirus 2: NEGATIVE [LJ]  0756 Similar to prior values  Creatinine(!): 1.37 [LJ]  0856 1. There is segmental wall thickening and fat stranding of the transverse colon as well as the base of the cecum.  Findings are consistent with nonspecific infectious, inflammatory, or ischemic colitis, a pattern of recurrent segmental cecal and transverse colon involvement with skip lesions favoring Crohn's disease. 2. Normal appendix. 3. Cecal and sigmoid diverticula, which do not however appear to be the underlying etiology of inflammation. 4. Small volume free fluid in the low pelvis, likely reactive. 5. Status post hysterectomy.  CT ABDOMEN PELVIS W CONTRAST [LJ]  [id number] Patient discussed with Dr. Collene Mares with gastroenterology.  We will start her on Cipro in addition to the Flagyl that she already received.  Will discuss with the medicine team for admission.   [LJ]    Clinical Course User Index [LJ] Rayna Sexton, PA-C   MDM Rules/Calculators/A&P                          Pt is a 69 y.o. female that presents with a history, physical exam, and ED Clinical Course as noted above.   Patient presents today with 2 days of lower abdominal pain as well as some "blood when wiping".  Patient initially presented tachycardic as well as febrile.  Rectal temperature of 102.2 Fahrenheit.  She was fluid bolused and given IV antibiotics.  She has remained stable.  Slight improvement in her tachycardia.  Now normotensive.  Hemoglobin is stable.  Patient was noted to have bright red blood on her rectal exam.  Mild lower abdominal pain.  CT scan was obtained with findings as noted above.  Given her history, symptoms, CT scan findings, it appears that patient is experiencing a Crohn's flare.  No history of Crohn's disease.  Patient discussed with Dr. Collene Mares with gastroenterology.  Patient has already received Flagyl as well as Rocephin.  We will additionally give Cipro and will admit to medicine.  Dr. Collene Mares will plan on consulting on the patient.  COVID-19 test has been ordered.  Note: Portions of this report may have been transcribed using voice recognition software. Every effort was made to ensure accuracy; however,  inadvertent computerized transcription errors may be present.   Final Clinical Impression(s) / ED Diagnoses Final diagnoses:  Crohn's disease of colon with rectal bleeding [facility])   Rx / DC Orders ED Discharge Orders    None       Rayna Sexton, PA-C 03/25/20 1007    Maudie Flakes, MD 03/25/20 [phone]

## 2020-03-25 NOTE — H&P (Signed)
History and Physical        Hospital Admission Note Date: 03/25/2020  Patient name: Helen Hardy Medical record number: [id number] Date of birth: 08-Jan-1951 Age: 69 y.o. Gender: female  PCP: Deland Pretty, MD  Patient coming from: Home Lives with: Husband At baseline, ambulates: Independently  Chief Complaint    No chief complaint on file.     HPI:   This is a 69 year old female with past medical history of diverticulitis, rheumatoid arthritis, recent enteropathogenic E. coli GI infection in June, hypertension, chronic radicular lumbar pain, morbid obesity, non-Hodgkin's lymphoma (now in remission) who was brought in from home by EMS with complaints of constant moderate lower abdominal pain since yesterday with bright red blood per rectum and blood when wiping after a bowel movement.  ED Course: T103F, HR 116, RR 29, normotensive on room air.  Notable labs: BUN 29, creatinine 1.37 (baseline 0.8), calcium 8.9 (ionized calcium 0.88 but normal albumin-lab repeated and pending), COVID-19 negative.  CT abdomen pelvis with contrast most notable for: Segmental wall thickening and fat stranding of the transverse colon as well as base of the cecum consistent with infectious VS inflammatory VS ischemic colitis however favoring Crohn's disease.  Dr. Collene Mares, GI, consulted in the ED and believes this to be Crohn's disease.  She was started on Rocephin, Flagyl and Cipro and GI is to consult.  Vitals:   03/25/20 1016 03/25/20 1100  BP: 114/67 120/77  Pulse: 80   Resp: 20 12  Temp:    SpO2: 100%      Review of Systems:  Review of Systems  Constitutional: Positive for chills and malaise/fatigue. Negative for fever.  Respiratory: Negative for cough and shortness of breath.   Cardiovascular: Negative for chest pain and palpitations.  Gastrointestinal: Positive for abdominal pain and blood  in stool.  Genitourinary: Negative.   Musculoskeletal: Positive for joint pain.  Skin: Negative for rash.  Neurological: Negative.   All other systems reviewed and are negative.   Medical/Social/Family History   Past Medical History: Past Medical History:  Diagnosis Date  . Anemia   . Anemia of chronic renal failure, stage 3 (moderate) 12/30/2018  . Arthritis    Rheumatoid  . Cancer ([place])    Non-Hodgkins Lymphoma  . GERD (gastroesophageal reflux disease)   . Hypertension   . 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 Numbness and tingling of right leg   . Pneumonia     Past Surgical History:  Procedure Laterality Date  . 2012 Right big toe and toe beside - had pins in toes    . ABDOMINAL HYSTERECTOMY    . bladder tack - 2005    . EYE SURGERY Bilateral    cataract  . JOINT REPLACEMENT     both knee replacement  . LUMBAR LAMINECTOMY/DECOMPRESSION MICRODISCECTOMY N/A 03/04/2016   Procedure: RIGHT FAR LATERAL DISCECTOMY L5-S1;  Surgeon: Melina Schools, MD;  Location: [facility];  Service: Orthopedics;  Laterality: N/A;  . ROTATOR CUFF REPAIR Left   . TOTAL KNEE REVISION Left 04/08/2015   Procedure: REVISION LEFT TOTAL KNEE ;  Surgeon: Paralee Cancel, MD;  Location: [facility];  Service: Orthopedics;  Laterality: Left;    Medications: Prior to Admission medications  Medication Sig Start Date End Date Taking? Authorizing Provider  Ascorbic Acid (VITAMIN C) 1000 MG tablet Take 1,000 mg by mouth daily.   Yes [provider]  Cholecalciferol (VITAMIN D3) 1000 units CAPS Take 2,000 Units by mouth daily.    Yes [provider]  estradiol (ESTRACE) 1 MG tablet Take 1 mg by mouth daily.  12/25/12  Yes [provider]  folic acid (FOLVITE) 1 MG tablet Take 2 mg by mouth daily.    Yes [provider]  furosemide (LASIX) 40 MG tablet Take 40 mg by mouth daily.  12/01/18  Yes [provider]  gabapentin (NEURONTIN) 300 MG capsule Take 300-600 mg by mouth See  admin instructions. 300 mg in the morning and at 2 pm. 600 mg every night 03/17/18  Yes [provider]  HYDROcodone-acetaminophen (NORCO) 10-325 MG tablet Take 1 tablet by mouth every 8 (eight) hours as needed for moderate pain or severe pain.  12/16/18  Yes [provider]  leflunomide (ARAVA) 20 MG tablet Take 20 mg by mouth daily.   Yes [provider]  methocarbamol (ROBAXIN) 500 MG tablet Take 750 mg by mouth in the morning and at bedtime.  03/11/18  Yes [provider]  Multiple Vitamin (MULTIVITAMIN) tablet Take 1 tablet by mouth daily.   Yes [provider]  olmesartan (BENICAR) 20 MG tablet Take 20 mg by mouth daily. 12/27/18  Yes [provider]  ondansetron (ZOFRAN) 4 MG tablet Take 4 mg by mouth every 8 (eight) hours as needed for nausea or vomiting.  04/10/19  Yes [provider]  pantoprazole (PROTONIX) 40 MG tablet Take 40 mg by mouth daily.    Yes [provider]  Potassium Chloride ER 20 MEQ TBCR Take 20 mEq by mouth daily.  12/22/18  Yes [provider]  predniSONE (DELTASONE) 10 MG tablet Take 10 mg by mouth daily. 04/12/18  Yes [provider]  triamcinolone cream (KENALOG) 0.1 % Apply 1 application topically as needed (for rash).    Yes [provider]  vitamin B-12 (CYANOCOBALAMIN) 1000 MCG tablet Take 1,000 mcg by mouth daily.   Yes [provider]  valACYclovir (VALTREX) 1000 MG tablet  03/12/20   [provider]    Allergies:   Allergies  Allergen Reactions  . Oxycodone Nausea And Vomiting  . Tramadol Other (See Comments)    UNKNOWN REACTION    Social History:  reports that she has never smoked. She has never used smokeless tobacco. She reports that she does not drink alcohol and does not use drugs.  Family History: History reviewed. No pertinent family history.   Objective   Physical Exam: Blood pressure 120/77, pulse 80, temperature (!) 102.2 F (39  C), temperature source Rectal, resp. rate 12, SpO2 100 %.  Physical Exam Vitals and nursing note reviewed.  Constitutional:      Appearance: She is obese. She is not ill-appearing.  HENT:     Head: Normocephalic and atraumatic.  Eyes:     Conjunctiva/sclera: Conjunctivae normal.  Cardiovascular:     Rate and Rhythm: Normal rate and regular rhythm.  Pulmonary:     Effort: Pulmonary effort is normal.     Breath sounds: Normal breath sounds.  Abdominal:     General: Abdomen is flat.     Tenderness: There is generalized abdominal tenderness.  Musculoskeletal:        General: No swelling or tenderness.  Skin:    Coloration: Skin is not  jaundiced or pale.  Neurological:     Mental Status: She is alert. Mental status is at baseline.  Psychiatric:        Mood and Affect: Mood normal.        Behavior: Behavior normal.     LABS on Admission: I have personally reviewed all the labs and imaging below    Basic Metabolic Panel: Recent Labs  Lab 03/19/20 1015 03/19/20 1015 03/25/20 0621 03/25/20 0649  NA 143   < > 137 140  K 4.9   < > 4.2 4.2  CL 109   < > 103 109  CO2 26  --  23  --   GLUCOSE 96   < > 115* 112*  BUN 31*   < > 29* 28*  CREATININE 1.34*  --  1.37* 1.40*  CALCIUM 9.9  --  8.9  --    < > = values in this interval not displayed.   Liver Function Tests: Recent Labs  Lab 03/19/20 1015 03/25/20 0621  AST 21 30  ALT 20 25  ALKPHOS 52 56  BILITOT 0.4 0.5  PROT 6.2* 6.5  ALBUMIN 3.9 3.7   No results for input(s): LIPASE, AMYLASE in the last 168 hours. No results for input(s): AMMONIA in the last 168 hours. CBC: Recent Labs  Lab 03/19/20 1015 03/19/20 1015 03/25/20 0621 03/25/20 0649  WBC 5.7  --  5.8  --   NEUTROABS 2.8   < > 3.0  --   HGB 10.0*  --  10.5* 10.2*  HCT 30.9*   < > 32.9* 30.0*  MCV 111.6*   < > 116.7*  --   PLT 125*  --  132*  --    < > = values in this interval not displayed.   Cardiac Enzymes: No results for input(s): CKTOTAL,  CKMB, CKMBINDEX, TROPONINI in the last 168 hours. BNP: Invalid input(s): POCBNP CBG: No results for input(s): GLUCAP in the last 168 hours.  Radiological Exams on Admission:  CT ABDOMEN PELVIS W CONTRAST  Result Date: 03/25/2020 CLINICAL DATA:  Left lower quadrant abdominal pain, history of diverticulitis, febrile EXAM: CT ABDOMEN AND PELVIS WITH CONTRAST TECHNIQUE: Multidetector CT imaging of the abdomen and pelvis was performed using the standard protocol following bolus administration of intravenous contrast. CONTRAST:  167m OMNIPAQUE IOHEXOL 300 MG/ML  SOLN COMPARISON:  12/25/2019 FINDINGS: Lower chest: No acute abnormality. Hepatobiliary: No solid liver abnormality is seen. No gallstones, gallbladder wall thickening, or biliary dilatation. Pancreas: Unremarkable. No pancreatic ductal dilatation or surrounding inflammatory changes. Spleen: Normal in size without significant abnormality. Adrenals/Urinary Tract: Adrenal glands are unremarkable. Kidneys are normal, without renal calculi, solid lesion, or hydronephrosis. Bladder is unremarkable. Stomach/Bowel: Stomach is within normal limits. Appendix appears normal. There is segmental wall thickening and fat stranding of the transverse colon (series 2, image 29) as well as the base of the cecum (series 4, image 46). There are occasional cecal diverticula as well as sigmoid diverticula, however these do not appear to be nidus of inflammation. Vascular/Lymphatic: No significant vascular findings are present. No enlarged abdominal or pelvic lymph nodes. Reproductive: Status post hysterectomy. Other: No abdominal wall hernia or abnormality. Small volume free fluid in the low pelvis. Musculoskeletal: No acute or significant osseous findings. IMPRESSION: 1. There is segmental wall thickening and fat stranding of the transverse colon as well as the base of the cecum. Findings are consistent with nonspecific infectious, inflammatory, or ischemic colitis, a  pattern of recurrent segmental cecal and transverse  colon involvement with skip lesions favoring Crohn's disease. 2. Normal appendix. 3. Cecal and sigmoid diverticula, which do not however appear to be the underlying etiology of inflammation. 4. Small volume free fluid in the low pelvis, likely reactive. 5. Status post hysterectomy. Electronically Signed   By: Eddie Candle M.D.   On: 03/25/2020 08:53   DG Chest Portable 1 View  Result Date: 03/25/2020 CLINICAL DATA:  Fever and shortness of breath EXAM: PORTABLE CHEST 1 VIEW COMPARISON:  July 17, 2016 FINDINGS: Lungs are clear. Heart is borderline enlarged with pulmonary vascularity normal. No adenopathy. No bone lesions. IMPRESSION: No edema or airspace opacity.  Mild cardiac enlargement. Electronically Signed   By: Lowella Grip III M.D.   On: 03/25/2020 07:54      EKG: Independently reviewed.    A & P   Principal Problem:   GI bleed Active Problems:   Rheumatoid arthritis (HCC)   Essential hypertension   AKI (acute kidney injury) ([place])   SIRS (systemic inflammatory response syndrome) (HCC)   1. GI bleed, concern for newly diagnosed Crohn's disease a. Hemodynamically stable on room air b. Hb stable at 10.5 c. CT abdomen/pelvis with segmental wall thickening and fat stranding of the transverse colon as well as base of the cecum consistent with infectious VS inflammatory VS ischemic colitis however favoring Crohn's disease d. GI consulted  2. SIRS, possibly developing sepsis secondary to IBD with possible underlying infection in an immunosuppressed patient a. Febrile, tachycardic, tachypneic without leukocytosis b. Received IV fluids in the ED c. Continue Cipro and metronidazole  3. Rheumatoid arthritis, not in acute flare a. Follows with rheumatology and gets monthly injections b. Holding home leflunomide and prednisone  4. Hypocalcemia a. Calcium is normal on BMP with normal albumin but ionized calcium is low and she  is asymptomatic b. Repeat labs  5. Elevated creatinine a. Creatinine 1.37, baseline 0.8 but has been between 1.3-1.4 since July.  This may be her new baseline versus an AKI b. Follow-up in a.m. after fluids c. UA  6. Hypertension, stable a. Hold ARB b. Continue beta-blocker   DVT prophylaxis: SCD   Code Status: Prior  Diet: N.p.o. pending GI recommendations Family Communication: Admission, patients condition and plan of care including tests being ordered have been discussed with the patient who indicates understanding and agrees with the plan and Code Status. Patient's husband at bedside was updated  Disposition Plan: The appropriate patient status for this patient is INPATIENT. Inpatient status is judged to be reasonable and necessary in order to provide the required intensity of service to ensure the patient's safety. The patient's presenting symptoms, physical exam findings, and initial radiographic and laboratory data in the context of their chronic comorbidities is felt to place them at high risk for further clinical deterioration. Furthermore, it is not anticipated that the patient will be medically stable for discharge from the hospital within 2 midnights of admission. The following factors support the patient status of inpatient.   " The patient's presenting symptoms include abdominal pain, bright red blood per rectum. " The worrisome physical exam findings include febrile, abdominal pain. " The initial radiographic and laboratory data are worrisome because of consistent with Crohn's with fever, tachycardia and tachypnea. " The chronic co-morbidities include rheumatoid arthritis.   * I certify that at the point of admission it is my clinical judgment that the patient will require inpatient hospital care spanning beyond 2 midnights from the point of admission due to high intensity of service,  high risk for further deterioration and high frequency of surveillance required.*   Status  is: Inpatient  Remains inpatient appropriate because:IV treatments appropriate due to intensity of illness or inability to take PO and Inpatient level of care appropriate due to severity of illness   Dispo: The patient is from: Home              Anticipated d/c is to: Home              Anticipated d/c date is: 3 days              Patient currently is not medically stable to d/c.    Consultants  . GI  Procedures  . none  Time Spent on Admission: 65 minutes    Harold Hedge, DO [facility] Pager [phone] 03/25/2020, 11:31 AM

## 2020-03-25 NOTE — Progress Notes (Signed)
Pharmacy Antibiotic Note  Helen Hardy is a 69 y.o. female admitted on 03/25/2020 with IAI.  Pharmacy has been consulted for cipro dosing.  Plan: Cipro 400 IV q12 Flagyl 500 IV q8h per MD F/u renal fxn   Temp (24hrs), Avg:102.6 F (39.2 C), Min:102.2 F (39 C), Max:103 F (39.4 C)  Recent Labs  Lab 03/19/20 1015 03/25/20 0620 03/25/20 0621 03/25/20 0649  WBC 5.7  --  5.8  --   CREATININE 1.34*  --  1.37* 1.40*  LATICACIDVEN  --  1.6  --   --     Estimated Creatinine Clearance: 39.8 mL/min (A) (by C-G formula based on SCr of 1.4 mg/dL (H)).    Allergies  Allergen Reactions  . Oxycodone Nausea And Vomiting  . Tramadol Other (See Comments)    UNKNOWN REACTION    Thank you for allowing pharmacy to be a part of this patient's care.  Eudelia Bunch, Pharm.D 03/25/2020 11:41 AM

## 2020-03-25 NOTE — ED Notes (Signed)
Patient's first lactic was normal. No need for second one per [facility].

## 2020-03-25 NOTE — Progress Notes (Signed)
Report received from [facility].

## 2020-03-25 NOTE — Consult Note (Addendum)
Reason for Consult: Colitis on CT scan/rectal bleeding.Helen Hardy Referring Physician: [facility] hospitalist  Helen Hardy [name] is an 69 y.o. female.  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 She comes in today with a history of abdominal pain that she has had since yesterday with some rectal bleeding with bowel movements.  In the emergency room she was found to have a segment wall thickening and fat stranding of the transverse colon as well as the base of the cecum ?infectious versus inflammatory colitis. There are some mention of Crohn's disease but just for clarification purposes I have NOT made a diagnosis of Crohn's disease in this patient. 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   Past Medical History:  Diagnosis Date  . Anemia   . Anemia of chronic renal failure, stage 3 (moderate) 12/30/2018  . Arthritis    Rheumatoid  . Cancer ([place])    Non-Hodgkins Lymphoma  . GERD (gastroesophageal reflux disease)   . Hypertension   . 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 Numbness and tingling of right leg   . Pneumonia    Past Surgical History:  Procedure Laterality Date  . 2012 Right big toe and toe beside - had pins in toes    . ABDOMINAL HYSTERECTOMY    . bladder tack - 2005    . EYE SURGERY Bilateral    cataract  . JOINT REPLACEMENT     both knee replacement  . LUMBAR LAMINECTOMY/DECOMPRESSION MICRODISCECTOMY N/A 03/04/2016   Procedure: RIGHT FAR LATERAL DISCECTOMY L5-S1;  Surgeon: Melina Schools, MD;  Location: [facility];  Service: Orthopedics;  Laterality: N/A;  . ROTATOR CUFF REPAIR Left   . TOTAL KNEE REVISION Left 04/08/2015   Procedure: REVISION LEFT TOTAL KNEE ;  Surgeon: Paralee Cancel, MD;  Location:  [facility];  Service: Orthopedics;  Laterality: Left;   History reviewed. No pertinent family history.  Social History:  reports that she has never smoked. She has never used smokeless tobacco. She reports that she does not drink alcohol and does not use drugs.  Allergies:  Allergies  Allergen Reactions  . Oxycodone Nausea And Vomiting  . Tramadol Other (See Comments)    UNKNOWN REACTION   Medications: I have reviewed the patient's current medications.  Results for orders placed or performed during the hospital encounter of 03/25/20 (from the past 48 hour(s))  Lactic acid, plasma     Status: None   Collection Time: 03/25/20  6:20 AM  Result Value Ref Range   Lactic Acid, Venous 1.6 0.5 - 1.9 mmol/L    Comment: Performed at [facility], [place] [address], [place], [place] [address]  Comprehensive metabolic panel     Status: Abnormal   Collection Time: 03/25/20  6:21 AM  Result Value Ref Range   Sodium 137 135 - 145 mmol/L   Potassium 4.2 3.5 - 5.1 mmol/L   Chloride 103 98 - 111 mmol/L   CO2 23 22 - 32 mmol/L   Glucose, Bld 115 (H) 70 - 99 mg/dL    Comment: Glucose reference range applies only to samples taken after fasting for at least 8 hours.   BUN 29 (H) 8 - 23 mg/dL  Creatinine, Ser 1.37 (H) 0.44 - 1.00 mg/dL   Calcium 8.9 8.9 - 10.3 mg/dL   Total Protein 6.5 6.5 - 8.1 g/dL   Albumin 3.7 3.5 - 5.0 g/dL   AST 30 15 - 41 U/L   ALT 25 0 - 44 U/L   Alkaline Phosphatase 56 38 - 126 U/L   Total Bilirubin 0.5 0.3 - 1.2 mg/dL   GFR calc non Af Amer 39 (L) >60 mL/min   GFR calc Af Amer 45 (L) >60 mL/min   Anion gap 11 5 - 15    Comment: Performed at [facility], [place] [address] [place], [place] [address]  CBC with Differential     Status: Abnormal   Collection Time: 03/25/20  6:21 AM  Result Value Ref Range   WBC 5.8 4.0 - 10.5 K/uL   RBC 2.82 (L) 3.87 - 5.11 MIL/uL   Hemoglobin 10.5 (L) 12.0 - 15.0 g/dL   HCT 32.9 (L) 36 - 46 %   MCV  116.7 (H) 80.0 - 100.0 fL   MCH 37.2 (H) 26.0 - 34.0 pg   MCHC 31.9 30.0 - 36.0 g/dL   RDW 17.9 (H) 11.5 - 15.5 %   Platelets 132 (L) 150 - 400 K/uL   nRBC 2.3 (H) 0.0 - 0.2 %   Neutrophils Relative % 52 %   Neutro Abs 3.0 1.7 - 7.7 K/uL   Lymphocytes Relative 40 %   Lymphs Abs 2.3 0.7 - 4.0 K/uL   Monocytes Relative 7 %   Monocytes Absolute 0.4 0 - 1 K/uL   Eosinophils Relative 0 %   Eosinophils Absolute 0.0 0 - 0 K/uL   Basophils Relative 0 %   Basophils Absolute 0.0 0 - 0 K/uL   Immature Granulocytes 1 %   Abs Immature Granulocytes 0.04 0.00 - 0.07 K/uL   Polychromasia PRESENT     Comment: Performed at [facility], [place] [address] [place], [place] [address]  SARS Coronavirus 2 by RT PCR (hospital order, performed in [facility] hospital lab) Nasopharyngeal Nasopharyngeal Swab     Status: None   Collection Time: 03/25/20  6:22 AM   Specimen: Nasopharyngeal Swab  Result Value Ref Range   SARS Coronavirus 2 NEGATIVE NEGATIVE    Comment: (NOTE) SARS-CoV-2 target nucleic acids are NOT DETECTED.  The SARS-CoV-2 RNA is generally detectable in upper and lower respiratory specimens during the acute phase of infection. The lowest concentration of SARS-CoV-2 viral copies this assay can detect is 250 copies / mL. A negative result does not preclude SARS-CoV-2 infection and should not be used as the sole basis for treatment or other patient management decisions.  A negative result may occur with improper specimen collection / handling, submission of specimen other than nasopharyngeal swab, presence of viral mutation(s) within the areas targeted by this assay, and inadequate number of viral copies (<250 copies / mL). A negative result must be combined with clinical observations, patient history, and epidemiological information.  Fact Sheet for Patients:   StrictlyIdeas.no  Fact Sheet for Healthcare  Providers: BankingDealers.co.za  This test is not yet approved or  cleared by the [place] FDA and has been authorized for detection and/or diagnosis of SARS-CoV-2 by FDA under an Emergency Use Authorization (EUA).  This EUA will remain in effect (meaning this test can be used) for the duration of the COVID-19 declaration under Section 564(b)(1) of the Act, 21 U.S.C. section 360bbb-3(b)(1), unless the authorization is terminated or revoked sooner.  Performed at [facility], [place] [address], [place], [place] [address]   Type and screen [place]     Status: None   Collection Time: 03/25/20  6:40 AM  Result Value Ref Range   ABO/RH(D) O POS    Antibody Screen NEG    Sample Expiration      03/28/2020,2359 Performed at [place], [place] [address] [place], [place] [address]   I-stat chem 8, ED (not at [facility] or [facility])     Status: Abnormal   Collection Time: 03/25/20  6:49 AM  Result Value Ref Range   Sodium 140 135 - 145 mmol/L   Potassium 4.2 3.5 - 5.1 mmol/L   Chloride 109 98 - 111 mmol/L   BUN 28 (H) 8 - 23 mg/dL   Creatinine, Ser 1.40 (H) 0.44 - 1.00 mg/dL   Glucose, Bld 112 (H) 70 - 99 mg/dL    Comment: Glucose reference range applies only to samples taken after fasting for at least 8 hours.   Calcium, Ion 0.88 (LL) 1.15 - 1.40 mmol/L   TCO2 25 22 - 32 mmol/L   Hemoglobin 10.2 (L) 12.0 - 15.0 g/dL   HCT 30.0 (L) 36 - 46 %   Comment NOTIFIED PHYSICIAN   Magnesium     Status: None   Collection Time: 03/25/20  7:01 AM  Result Value Ref Range   Magnesium 2.2 1.7 - 2.4 mg/dL    Comment: Performed at [facility], [place] [address], [place], [place] [address]  Phosphorus     Status: Abnormal   Collection Time: 03/25/20  7:01 AM  Result Value Ref Range   Phosphorus 2.2 (L) 2.5 - 4.6 mg/dL    Comment: Performed at [facility], [place] [address], [place],  [place] [address]  POC occult blood, ED     Status: Abnormal   Collection Time: 03/25/20  9:21 AM  Result Value Ref Range   Fecal Occult Bld POSITIVE (A) NEGATIVE    CT ABDOMEN PELVIS W CONTRAST  Result Date: 03/25/2020 CLINICAL DATA:  Left lower quadrant abdominal pain, history of diverticulitis, febrile EXAM: CT ABDOMEN AND PELVIS WITH CONTRAST TECHNIQUE: Multidetector CT imaging of the abdomen and pelvis was performed using the standard protocol following bolus administration of intravenous contrast. CONTRAST:  162m OMNIPAQUE IOHEXOL 300 MG/ML  SOLN COMPARISON:  12/25/2019 FINDINGS: Lower chest: No acute abnormality. Hepatobiliary: No solid liver abnormality is seen. No gallstones, gallbladder wall thickening, or biliary dilatation. Pancreas: Unremarkable. No pancreatic ductal dilatation or surrounding inflammatory changes. Spleen: Normal in size without significant abnormality. Adrenals/Urinary Tract: Adrenal glands are unremarkable. Kidneys are normal, without renal calculi, solid lesion, or hydronephrosis. Bladder is unremarkable. Stomach/Bowel: Stomach is within normal limits. Appendix appears normal. There is segmental wall thickening and fat stranding of the transverse colon (series 2, image 29) as well as the base of the cecum (series 4, image 46). There are occasional cecal diverticula as well as sigmoid diverticula, however these do not appear to be nidus of inflammation. Vascular/Lymphatic: No significant vascular findings are present. No enlarged abdominal or pelvic lymph nodes. Reproductive: Status post hysterectomy. Other: No abdominal wall hernia or abnormality. Small volume free fluid in the low pelvis. Musculoskeletal: No acute or significant osseous findings. IMPRESSION: 1. There is segmental wall thickening and fat stranding of the transverse colon as well as the base of the cecum. Findings are consistent with nonspecific infectious, inflammatory, or ischemic colitis, a pattern of recurrent  segmental cecal and transverse colon involvement with skip lesions favoring Crohn's disease. 2. Normal appendix. 3. Cecal and sigmoid diverticula, which do not however appear to be the underlying etiology of inflammation. 4. Small volume free fluid in the low pelvis, likely reactive. 5. Status post hysterectomy. Electronically Signed   By: Eddie Candle M.D.   On: 03/25/2020 08:53   DG Chest Portable 1 View  Result Date: 03/25/2020 CLINICAL DATA:  Fever and shortness of breath EXAM: PORTABLE CHEST 1 VIEW COMPARISON:  July 17, 2016 FINDINGS: Lungs are clear. Heart is borderline enlarged with pulmonary vascularity normal. No adenopathy. No bone lesions. IMPRESSION: No edema or airspace opacity.  Mild cardiac enlargement. Electronically Signed   By: Lowella Grip III M.D.   On: 03/25/2020 07:54    Review of Systems  Gastrointestinal: Positive for abdominal pain and blood in stool.  Musculoskeletal: Positive for arthralgias.   Blood pressure 108/61, pulse 94, temperature 99.8 F (37.7 C), temperature source Oral, resp. rate (!) 22, SpO2 100 %. Physical Exam Constitutional:      Appearance: Normal appearance.  HENT:     Head: Normocephalic and atraumatic.     Nose: Nose normal.     Mouth/Throat:     Mouth: Mucous membranes are dry.  Eyes:     Extraocular Movements: Extraocular movements intact.     Pupils: Pupils are equal, round, and reactive to light.  Cardiovascular:     Rate and Rhythm: Normal rate and regular rhythm.  Abdominal:     General: Bowel sounds are normal.     Palpations: Abdomen is soft.  Musculoskeletal:     Cervical back: Normal range of motion and neck supple.  Skin:    General: Skin is warm and dry.  Neurological:     General: No focal deficit present.     Mental Status: She is alert and oriented to person, place, and time.  Psychiatric:        Mood and Affect: Mood normal.        Behavior: Behavior normal.        Thought Content: Thought content normal.         Judgment: Judgment normal.   Assessment/Plan: 1) Rectal bleeding with fever and an abnormal CT scan-pandiverticulosis on last colonoscopy-I suspect she has diverticulitis vs an infectious colitis-agree with Ciporfoxacin and Flagyl for now. Will allow her clears and advance to full liquids later today. Again I dont think she has Crohn's disease. 2) GERD/Hiatal hernia.  3) Rheumatoid arthritis. 4) HTN. 5) ?AKI.  Helen Hardy 03/25/2020, 12:52 PM

## 2020-03-25 NOTE — Progress Notes (Signed)
Dr. Neysa Bonito notified of fever of 101. PRN tylenol given. No further orders at this time.

## 2020-03-25 NOTE — Progress Notes (Signed)
Patients husband, Mr. Rogelio Seen notified of patient being in room 1612.

## 2020-03-26 DIAGNOSIS — K922 Gastrointestinal hemorrhage, unspecified: Secondary | ICD-10-CM | POA: Diagnosis not present

## 2020-03-26 LAB — CBC
HCT: 27.3 % — ABNORMAL LOW (ref 36.0–46.0)
Hemoglobin: 8.7 g/dL — ABNORMAL LOW (ref 12.0–15.0)
MCH: 36.9 pg — ABNORMAL HIGH (ref 26.0–34.0)
MCHC: 31.9 g/dL (ref 30.0–36.0)
MCV: 115.7 fL — ABNORMAL HIGH (ref 80.0–100.0)
Platelets: 115 10*3/uL — ABNORMAL LOW (ref 150–400)
RBC: 2.36 MIL/uL — ABNORMAL LOW (ref 3.87–5.11)
RDW: 18.3 % — ABNORMAL HIGH (ref 11.5–15.5)
WBC: 7.5 10*3/uL (ref 4.0–10.5)
nRBC: 0.7 % — ABNORMAL HIGH (ref 0.0–0.2)

## 2020-03-26 LAB — BASIC METABOLIC PANEL
Anion gap: 8 (ref 5–15)
BUN: 20 mg/dL (ref 8–23)
CO2: 22 mmol/L (ref 22–32)
Calcium: 7.8 mg/dL — ABNORMAL LOW (ref 8.9–10.3)
Chloride: 108 mmol/L (ref 98–111)
Creatinine, Ser: 1.17 mg/dL — ABNORMAL HIGH (ref 0.44–1.00)
GFR calc Af Amer: 55 mL/min — ABNORMAL LOW (ref >60)
GFR calc non Af Amer: 47 mL/min — ABNORMAL LOW (ref >60)
Glucose, Bld: 79 mg/dL (ref 70–99)
Potassium: 4.1 mmol/L (ref 3.5–5.1)
Sodium: 138 mmol/L (ref 135–145)

## 2020-03-26 LAB — CALCIUM, IONIZED: Calcium, Ionized, Serum: 4.9 mg/dL (ref 4.5–5.6)

## 2020-03-26 MED ORDER — SODIUM CHLORIDE 0.9 % IV SOLN: INTRAVENOUS | Status: DC

## 2020-03-26 MED ORDER — SACCHAROMYCES BOULARDII 250 MG PO CAPS
250.0000 mg | ORAL_CAPSULE | Freq: Two times a day (BID) | ORAL | Status: DC
  Administered 2020-03-26 – 2020-04-05 (×21): 250 mg via ORAL
  Filled 2020-03-26 (×21): qty 1

## 2020-03-26 NOTE — Progress Notes (Signed)
PROGRESS NOTE    Helen Hardy  ZOX:[id number] DOB: May 28, 1951 DOA: 03/25/2020 PCP: Deland Pretty, MD    Brief Narrative: 69 year old female with past medical history of diverticulitis, rheumatoid arthritis, recent enteropathogenic E. coli GI infection in June, hypertension, chronic radicular lumbar pain, morbid obesity, non-Hodgkin's lymphoma (now in remission) who was brought in from home by EMS with complaints of constant moderate lower abdominal pain since yesterday with bright red blood per rectum and blood when wiping after a bowel movement.  ED Course: T103F, HR 116, RR 29, normotensive on room air.  Notable labs: BUN 29, creatinine 1.37 (baseline 0.8), calcium 8.9 (ionized calcium 0.88 but normal albumin-lab repeated and pending), COVID-19 negative.  CT abdomen pelvis with contrast most notable for: Segmental wall thickening and fat stranding of the transverse colon as well as base of the cecum consistent with infectious VS inflammatory VS ischemic colitis however favoring Crohn's disease.  Dr. Collene Mares, GI, consulted in the ED and believes this to be Crohn's disease.  She was started on Rocephin, Flagyl and Cipro and GI is to consult.  Assessment & Plan:   Principal Problem:   GI bleed Active Problems:   Rheumatoid arthritis ([place])   Essential hypertension   AKI (acute kidney injury) ([place])   SIRS (systemic inflammatory response syndrome) ([facility])   #1 acute diverticulitis versus infectious colitis-patient presented with high fever abdominal pain bloody diarrhea.  At the time of admission she met sepsis criteria with fever of 103, tachypnea  tachycardia hypotension with no leukocytosis.  I do not have a lactate level on her.  She has been treated with IV fluids Cipro and Flagyl.  We will continue the same.  Blood pressure is still borderline, continue IV fluids. Blood cultures so far no growth. Advance diet to full liquids. Patient still complains of abdominal pain but no nausea  vomiting or further bloody bowel movements reported. CT abdomen and pelvis 03/25/2020 with segmental wall thickening and fat stranding of the transverse colon as well as the base of the cecum consistent with infectious versus inflammatory versus ischemic colitis however favoring Crohn's disease. Appreciate GI input.  #2 history of rheumatoid arthritis holding steroids in light of sepsis  #3 hypotension patient with history of hypertension on ARB  and Lasix at home which is on hold.    Estimated body mass index is 45.6 kg/m as calculated from the following:   Height as of this encounter: 5' (1.524 m).   Weight as of this encounter: 105.9 kg.  DVT prophylaxis: SCD due to bleeding per rectum  code Status: Full code Family Communication: None at bedside  disposition Plan:  Status is: Inpatient  Dispo: The patient is from:home              Anticipated d/c is to: Home              Anticipated d/c date is: More than 2 days              Patient currently is not medically stable to d/c.  Patient admitted with abdominal pain fever and bloody bowel movements on IV fluids and IV antibiotics still on clear liquids    Consultants:   GI  Procedures: None Antimicrobials: Cipro and Flagyl  Subjective: She continues to complain of abdominal pain but no further bloody bowel movements reported denies any nausea vomiting  Objective: Vitals:   03/26/20 0551 03/26/20 0809 03/26/20 0903 03/26/20 1121  BP: (!) 145/75 (!) 110/52 104/68 (!) 103/59  Pulse:  100 98 96 93  Resp: 20 18 19 20   Temp: (!) 101.8 F (38.8 C) 99.4 F (37.4 C) 100.1 F (37.8 C) 99.4 F (37.4 C)  TempSrc: Oral Oral Oral Oral  SpO2: 96% 95% 98% 97%  Weight:      Height:        Intake/Output Summary (Last 24 hours) at 03/26/2020 1232 Last data filed at 03/26/2020 0745 Gross per 24 hour  Intake --  Output 2502 ml  Net -2502 ml   Filed Weights   03/25/20 1325  Weight: 105.9 kg    Examination:  General exam:  Appears calm and comfortable  Respiratory system: Clear to auscultation. Respiratory effort normal. Cardiovascular system: S1 & S2 heard, RRR. No JVD, murmurs, rubs, gallops or clicks. No pedal edema. Gastrointestinal system: Abdomen is nondistended, soft and tender. No organomegaly or masses felt. Normal bowel sounds heard. Central nervous system: Alert and oriented. No focal neurological deficits. Extremities: Symmetric 5 x 5 power. Skin: No rashes, lesions or ulcers Psychiatry: Judgement and insight appear normal. Mood & affect appropriate.     Data Reviewed: I have personally reviewed following labs and imaging studies  CBC: Recent Labs  Lab 03/25/20 0621 03/25/20 0649 03/26/20 0723  WBC 5.8  --  7.5  NEUTROABS 3.0  --   --   HGB 10.5* 10.2* 8.7*  HCT 32.9* 30.0* 27.3*  MCV 116.7*  --  115.7*  PLT 132*  --  979*   Basic Metabolic Panel: Recent Labs  Lab 03/25/20 0621 03/25/20 0649 03/25/20 0701 03/26/20 0723  NA 137 140  --  138  K 4.2 4.2  --  4.1  CL 103 109  --  108  CO2 23  --   --  22  GLUCOSE 115* 112*  --  79  BUN 29* 28*  --  20  CREATININE 1.37* 1.40*  --  1.17*  CALCIUM 8.9  --   --  7.8*  MG  --   --  2.2  --   PHOS  --   --  2.2*  --    GFR: Estimated Creatinine Clearance: 49.2 mL/min (A) (by C-G formula based on SCr of 1.17 mg/dL (H)). Liver Function Tests: Recent Labs  Lab 03/25/20 0621  AST 30  ALT 25  ALKPHOS 56  BILITOT 0.5  PROT 6.5  ALBUMIN 3.7   No results for input(s): LIPASE, AMYLASE in the last 168 hours. No results for input(s): AMMONIA in the last 168 hours. Coagulation Profile: No results for input(s): INR, PROTIME in the last 168 hours. Cardiac Enzymes: No results for input(s): CKTOTAL, CKMB, CKMBINDEX, TROPONINI in the last 168 hours. BNP (last 3 results) No results for input(s): PROBNP in the last 8760 hours. HbA1C: No results for input(s): HGBA1C in the last 72 hours. CBG: No results for input(s): GLUCAP in the last  168 hours. Lipid Profile: No results for input(s): CHOL, HDL, LDLCALC, TRIG, CHOLHDL, LDLDIRECT in the last 72 hours. Thyroid Function Tests: No results for input(s): TSH, T4TOTAL, FREET4, T3FREE, THYROIDAB in the last 72 hours. Anemia Panel: No results for input(s): VITAMINB12, FOLATE, FERRITIN, TIBC, IRON, RETICCTPCT in the last 72 hours. Sepsis Labs: Recent Labs  Lab 03/25/20 0620  LATICACIDVEN 1.6    Recent Results (from the past 240 hour(s))  Culture, blood (routine x 2)     Status: None (Preliminary result)   Collection Time: 03/25/20  6:10 AM   Specimen: BLOOD RIGHT HAND  Result Value Ref Range Status  Specimen Description   Final    BLOOD RIGHT HAND Performed at [facility] [address] [place], [place] [address]    Special Requests   Final    BOTTLES DRAWN AEROBIC AND ANAEROBIC Blood Culture adequate volume Performed at [facility] [address] [place], [place] [address]    Culture   Final    NO GROWTH 1 DAY Performed at [facility], [place] [address], [place], [place] [address]    Report Status PENDING  Incomplete  Culture, blood (routine x 2)     Status: None (Preliminary result)   Collection Time: 03/25/20  6:20 AM   Specimen: BLOOD  Result Value Ref Range Status   Specimen Description   Final    BLOOD RIGHT ANTECUBITAL Performed at [facility] [address] [place], [place] [address]    Special Requests   Final    BOTTLES DRAWN AEROBIC AND ANAEROBIC Blood Culture adequate volume Performed at [facility] [address], [place], [place] [address]    Culture   Final    NO GROWTH 1 DAY Performed at [facility], [place] [address], [place], [place] [address]    Report Status PENDING  Incomplete  SARS Coronavirus 2 by RT PCR (hospital order, performed in [facility] hospital lab) Nasopharyngeal Nasopharyngeal Swab     Status: None   Collection Time: 03/25/20   6:22 AM   Specimen: Nasopharyngeal Swab  Result Value Ref Range Status   SARS Coronavirus 2 NEGATIVE NEGATIVE Final    Comment: (NOTE) SARS-CoV-2 target nucleic acids are NOT DETECTED.  The SARS-CoV-2 RNA is generally detectable in upper and lower respiratory specimens during the acute phase of infection. The lowest concentration of SARS-CoV-2 viral copies this assay can detect is 250 copies / mL. A negative result does not preclude SARS-CoV-2 infection and should not be used as the sole basis for treatment or other patient management decisions.  A negative result may occur with improper specimen collection / handling, submission of specimen other than nasopharyngeal swab, presence of viral mutation(s) within the areas targeted by this assay, and inadequate number of viral copies (<250 copies / mL). A negative result must be combined with clinical observations, patient history, and epidemiological information.  Fact Sheet for Patients:   StrictlyIdeas.no  Fact Sheet for Healthcare Providers: BankingDealers.co.za  This test is not yet approved or  cleared by the [place] FDA and has been authorized for detection and/or diagnosis of SARS-CoV-2 by FDA under an Emergency Use Authorization (EUA).  This EUA will remain in effect (meaning this test can be used) for the duration of the COVID-19 declaration under Section 564(b)(1) of the Act, 21 U.S.C. section 360bbb-3(b)(1), unless the authorization is terminated or revoked sooner.  Performed at [facility], [place] [address], [place], [place] [address]          Radiology Studies: CT ABDOMEN PELVIS W CONTRAST  Result Date: 03/25/2020 CLINICAL DATA:  Left lower quadrant abdominal pain, history of diverticulitis, febrile EXAM: CT ABDOMEN AND PELVIS WITH CONTRAST TECHNIQUE: Multidetector CT imaging of the abdomen and pelvis was performed using the standard protocol  following bolus administration of intravenous contrast. CONTRAST:  130m OMNIPAQUE IOHEXOL 300 MG/ML  SOLN COMPARISON:  12/25/2019 FINDINGS: Lower chest: No acute abnormality. Hepatobiliary: No solid liver abnormality is seen. No gallstones, gallbladder wall thickening, or biliary dilatation. Pancreas: Unremarkable. No pancreatic ductal dilatation or surrounding inflammatory changes. Spleen:  Normal in size without significant abnormality. Adrenals/Urinary Tract: Adrenal glands are unremarkable. Kidneys are normal, without renal calculi, solid lesion, or hydronephrosis. Bladder is unremarkable. Stomach/Bowel: Stomach is within normal limits. Appendix appears normal. There is segmental wall thickening and fat stranding of the transverse colon (series 2, image 29) as well as the base of the cecum (series 4, image 46). There are occasional cecal diverticula as well as sigmoid diverticula, however these do not appear to be nidus of inflammation. Vascular/Lymphatic: No significant vascular findings are present. No enlarged abdominal or pelvic lymph nodes. Reproductive: Status post hysterectomy. Other: No abdominal wall hernia or abnormality. Small volume free fluid in the low pelvis. Musculoskeletal: No acute or significant osseous findings. IMPRESSION: 1. There is segmental wall thickening and fat stranding of the transverse colon as well as the base of the cecum. Findings are consistent with nonspecific infectious, inflammatory, or ischemic colitis, a pattern of recurrent segmental cecal and transverse colon involvement with skip lesions favoring Crohn's disease. 2. Normal appendix. 3. Cecal and sigmoid diverticula, which do not however appear to be the underlying etiology of inflammation. 4. Small volume free fluid in the low pelvis, likely reactive. 5. Status post hysterectomy. Electronically Signed   By: Eddie Candle M.D.   On: 03/25/2020 08:53   DG Chest Portable 1 View  Result Date: 03/25/2020 CLINICAL DATA:   Fever and shortness of breath EXAM: PORTABLE CHEST 1 VIEW COMPARISON:  July 17, 2016 FINDINGS: Lungs are clear. Heart is borderline enlarged with pulmonary vascularity normal. No adenopathy. No bone lesions. IMPRESSION: No edema or airspace opacity.  Mild cardiac enlargement. Electronically Signed   By: Lowella Grip III M.D.   On: 03/25/2020 07:54        Scheduled Meds:  estradiol  1 mg Oral Daily   folic acid  2 mg Oral Daily   gabapentin  300 mg Oral Daily   And   gabapentin  600 mg Oral QHS   pantoprazole  40 mg Oral Daily   sodium chloride flush  3 mL Intravenous Q12H   vitamin B-12  1,000 mcg Oral Daily   Continuous Infusions:  ciprofloxacin 400 mg (03/26/20 1108)   metronidazole 500 mg (03/26/20 0755)     LOS: 1 day     Georgette Shell, MD  03/26/2020, 12:32 PM

## 2020-03-26 NOTE — Evaluation (Signed)
Physical Therapy Evaluation Patient Details Name: Helen Hardy MRN: [id number] DOB: 07/19/1950 Today's Date: 03/26/2020   History of Present Illness  69 y.o. female with medical history significant of non-Hodgkin's lymphoma (dx in 2004) in remission, rheumatoid arthritis since age 8, chronic pain, hypertension, GERD, osteoarthritis, lumbar radiculopathy, morbid obesity, anemia of chronic disease and admitted for abdominal pain with some rectal bleeding with bowel movements  Clinical Impression  Pt admitted with above diagnosis.  Pt currently with functional limitations due to the deficits listed below (see PT Problem List). Pt will benefit from skilled PT to increase their independence and safety with mobility to allow discharge to the venue listed below.  Pt agreeable to mobilize and then requested to use bathroom.  Pt assisted into bathroom and then back to bed.  Pt too fatigued and reports LE pain so declined any further mobility.  Pt states she was receiving HHPT one time a week prior to admission.  Recommend resuming HHPT upon d/c.     Follow Up Recommendations Home health PT (was receiving HHPT 1x/week prior to admission)    Equipment Recommendations  None recommended by PT    Recommendations for Other Services       Precautions / Restrictions Precautions Precautions: Fall Restrictions Weight Bearing Restrictions: No      Mobility  Bed Mobility Overal bed mobility: Needs Assistance Bed Mobility: Supine to Sit;Sit to Supine     Supine to sit: Min guard Sit to supine: Mod assist   General bed mobility comments: pt requested assist for LEs onto bed  Transfers Overall transfer level: Needs assistance Equipment used: Rolling walker (2 wheeled) Transfers: Sit to/from Stand Sit to Stand: Min guard         General transfer comment: min/guard for safety, utilized grab bar in bathroom to self assist  Ambulation/Gait Ambulation/Gait assistance: Min guard Gait  Distance (Feet): 8 Feet (x2) Assistive device: Rolling walker (2 wheeled) Gait Pattern/deviations: Step-through pattern;Decreased stride length Gait velocity: decr   General Gait Details: slow with RW however no physical assist required, min/guard for safety due to reported LE pain (chronic per pt, also has hx of bil TKAs)  Stairs            Wheelchair Mobility    Modified Rankin (Stroke Patients Only)       Balance                                             Pertinent Vitals/Pain Pain Assessment: Faces Faces Pain Scale: Hurts even more Pain Location: bil legs Pain Descriptors / Indicators: Sore;Aching Pain Intervention(s): Repositioned;Monitored during session    Home Living Family/patient expects to be discharged to:: Private residence Living Arrangements: Spouse/significant other Available Help at Discharge: Family;Available 24 hours/day Type of Home: House Home Access: Stairs to enter   [employer] of Steps: 1 Home Layout: One level Home Equipment: Walker - 2 wheels;Cane - single point;Shower seat;Bedside commode      Prior Function Level of Independence: Independent with assistive device(s)         Comments: walks with RW or SPC at baseline, sits on shower seat to bathe     Hand Dominance        Extremity/Trunk Assessment        Lower Extremity Assessment Lower Extremity Assessment: Generalized weakness       Communication   Communication:  No difficulties  Cognition Arousal/Alertness: Awake/alert Behavior During Therapy: WFL for tasks assessed/performed Overall Cognitive Status: Within Functional Limits for tasks assessed                                        General Comments      Exercises     Assessment/Plan    PT Assessment Patient needs continued PT services  PT Problem List Decreased activity tolerance;Decreased strength;Decreased mobility;Obesity       PT Treatment  Interventions DME instruction;Gait training;Balance training;Functional mobility training;Therapeutic activities;Patient/family education;Therapeutic exercise    PT Goals (Current goals can be found in the Care Plan section)  Acute Rehab PT Goals PT Goal Formulation: With patient Time For Goal Achievement: 04/09/20 Potential to Achieve Goals: Good    Frequency Min 3X/week   Barriers to discharge        Co-evaluation               AM-PAC PT "6 Clicks" Mobility  Outcome Measure Help needed turning from your back to your side while in a flat bed without using bedrails?: A Little Help needed moving from lying on your back to sitting on the side of a flat bed without using bedrails?: A Little Help needed moving to and from a bed to a chair (including a wheelchair)?: A Little Help needed standing up from a chair using your arms (e.g., wheelchair or bedside chair)?: A Little Help needed to walk in hospital room?: A Little Help needed climbing 3-5 steps with a railing? : A Lot 6 Click Score: 17    End of Session Equipment Utilized During Treatment: Gait belt Activity Tolerance: Patient tolerated treatment well Patient left: in bed;with call bell/phone within reach;with bed alarm set   PT Visit Diagnosis: Difficulty in walking, not elsewhere classified (R26.2);Muscle weakness (generalized) (M62.81)    Time: 0973-5329 PT Time Calculation (min) (ACUTE ONLY): 24 min   Charges:   PT Evaluation $PT Eval Low Complexity: 1 Low     Kati PT, DPT Acute Rehabilitation Services Pager: [phone] Office: [phone]  York Ram E 03/26/2020, 11:33 AM

## 2020-03-26 NOTE — Progress Notes (Signed)
Yellow mews implemented for temp. Agricultural consultant notified. On call MD denny notified. PRN medication given. will recheck temp in 1 hr. This is documented in mews flowsheets.

## 2020-03-26 NOTE — Progress Notes (Signed)
Subjective: No new complaints.  Still with significant diarrhea.  Objective: Vital signs in last 24 hours: Temp:  [98.9 F (37.2 C)-101.8 F (38.8 C)] 98.9 F (37.2 C) (09/21 1321) Pulse Rate:  [80-100] 97 (09/21 1321) Resp:  [18-20] 19 (09/21 1321) BP: (103-145)/(52-75) 111/69 (09/21 1321) SpO2:  [93 %-100 %] 97 % (09/21 1321) Last BM Date: 03/26/20  Intake/Output from previous day: 09/20 0701 - 09/21 0700 In: 1401.4 [IV Piggyback:1401.4] Out: 1502 [Urine:1500; Stool:2] Intake/Output this shift: Total I/O In: -  Out: 1000 [Urine:1000]  General appearance: alert and mild distress GI: mildly tender to palpation  Lab Results: Recent Labs    03/25/20 0621 03/25/20 0649 03/26/20 0723  WBC 5.8  --  7.5  HGB 10.5* 10.2* 8.7*  HCT 32.9* 30.0* 27.3*  PLT 132*  --  115*   BMET Recent Labs    03/25/20 0621 03/25/20 0649 03/26/20 0723  NA 137 140 138  K 4.2 4.2 4.1  CL 103 109 108  CO2 23  --  22  GLUCOSE 115* 112* 79  BUN 29* 28* 20  CREATININE 1.37* 1.40* 1.17*  CALCIUM 8.9  --  7.8*   LFT Recent Labs    03/25/20 0621  PROT 6.5  ALBUMIN 3.7  AST 30  ALT 25  ALKPHOS 56  BILITOT 0.5   PT/INR No results for input(s): LABPROT, INR in the last 72 hours. Hepatitis Panel No results for input(s): HEPBSAG, HCVAB, HEPAIGM, HEPBIGM in the last 72 hours. C-Diff No results for input(s): CDIFFTOX in the last 72 hours. Fecal Lactopherrin No results for input(s): FECLLACTOFRN in the last 72 hours.  Studies/Results: CT ABDOMEN PELVIS W CONTRAST  Result Date: 03/25/2020 CLINICAL DATA:  Left lower quadrant abdominal pain, history of diverticulitis, febrile EXAM: CT ABDOMEN AND PELVIS WITH CONTRAST TECHNIQUE: Multidetector CT imaging of the abdomen and pelvis was performed using the standard protocol following bolus administration of intravenous contrast. CONTRAST:  162m OMNIPAQUE IOHEXOL 300 MG/ML  SOLN COMPARISON:  12/25/2019 FINDINGS: Lower chest: No acute  abnormality. Hepatobiliary: No solid liver abnormality is seen. No gallstones, gallbladder wall thickening, or biliary dilatation. Pancreas: Unremarkable. No pancreatic ductal dilatation or surrounding inflammatory changes. Spleen: Normal in size without significant abnormality. Adrenals/Urinary Tract: Adrenal glands are unremarkable. Kidneys are normal, without renal calculi, solid lesion, or hydronephrosis. Bladder is unremarkable. Stomach/Bowel: Stomach is within normal limits. Appendix appears normal. There is segmental wall thickening and fat stranding of the transverse colon (series 2, image 29) as well as the base of the cecum (series 4, image 46). There are occasional cecal diverticula as well as sigmoid diverticula, however these do not appear to be nidus of inflammation. Vascular/Lymphatic: No significant vascular findings are present. No enlarged abdominal or pelvic lymph nodes. Reproductive: Status post hysterectomy. Other: No abdominal wall hernia or abnormality. Small volume free fluid in the low pelvis. Musculoskeletal: No acute or significant osseous findings. IMPRESSION: 1. There is segmental wall thickening and fat stranding of the transverse colon as well as the base of the cecum. Findings are consistent with nonspecific infectious, inflammatory, or ischemic colitis, a pattern of recurrent segmental cecal and transverse colon involvement with skip lesions favoring Crohn's disease. 2. Normal appendix. 3. Cecal and sigmoid diverticula, which do not however appear to be the underlying etiology of inflammation. 4. Small volume free fluid in the low pelvis, likely reactive. 5. Status post hysterectomy. Electronically Signed   By: AEddie CandleM.D.   On: 03/25/2020 08:53   DG Chest Portable  1 View  Result Date: 03/25/2020 CLINICAL DATA:  Fever and shortness of breath EXAM: PORTABLE CHEST 1 VIEW COMPARISON:  July 17, 2016 FINDINGS: Lungs are clear. Heart is borderline enlarged with pulmonary  vascularity normal. No adenopathy. No bone lesions. IMPRESSION: No edema or airspace opacity.  Mild cardiac enlargement. Electronically Signed   By: Lowella Grip III M.D.   On: 03/25/2020 07:54    Medications:  Scheduled: . estradiol  1 mg Oral Daily  . folic acid  2 mg Oral Daily  . gabapentin  300 mg Oral Daily   And  . gabapentin  600 mg Oral QHS  . pantoprazole  40 mg Oral Daily  . sodium chloride flush  3 mL Intravenous Q12H  . vitamin B-12  1,000 mcg Oral Daily   Continuous: . sodium chloride 75 mL/hr at 03/26/20 1345  . ciprofloxacin 400 mg (03/26/20 1108)  . metronidazole 500 mg (03/26/20 0755)    Assessment/Plan: 1) Colitis. 2) Diarrhea.   Clinically she is stable, but there is no significant improvement per her report.  The suspicion is that she has an infectious colitis.  During her last admission, it was a few days before she started to feel better with the enteropathogenic E coli.    Plan: 1) Continue with Cipro and Flagyl. 2) Flexiseal rectal tube. 3) Supportive care.  LOS: 1 day   Vonceil Upshur D 03/26/2020, 2:31 PM

## 2020-03-26 NOTE — Evaluation (Signed)
Occupational Therapy Evaluation Patient Details Name: Helen Hardy MRN: [id number] DOB: 04/06/1951 Today's Date: 03/26/2020    History of Present Illness 69 y.o. female with medical history significant of non-Hodgkin's lymphoma (dx in 2004) in remission, rheumatoid arthritis since age 12, chronic pain, hypertension, GERD, osteoarthritis, lumbar radiculopathy, morbid obesity, anemia of chronic disease and admitted for abdominal pain with some rectal bleeding with bowel movements   Clinical Impression   Pt admitted with the above. Pt currently with functional limitations due to the deficits listed below (see OT Problem List).  Pt will benefit from skilled OT to increase their safety and independence with ADL and functional mobility for ADL to facilitate discharge to venue listed below.      Follow Up Recommendations  Home health OT;Supervision/Assistance - 24 hour (depending on progress) and if husband can A as needed           Precautions / Restrictions Precautions Precautions: Fall Precaution Comments: loose stool with mobility      Mobility Bed Mobility Overal bed mobility: Needs Assistance Bed Mobility: Supine to Sit;Sit to Supine     Supine to sit: Min guard Sit to supine: Mod assist   General bed mobility comments: pt requested assist for LEs onto bed  Transfers Overall transfer level: Needs assistance Equipment used: Rolling walker (2 wheeled) Transfers: Sit to/from [facility] Sit to Stand: Min assist Stand pivot transfers: Min assist;Mod assist       General transfer comment: min/guard for safety, utilized grab bar in bathroom to self assist    Balance Overall balance assessment: Needs assistance Sitting-balance support: No upper extremity supported Sitting balance-Leahy Scale: Fair     Standing balance support: Single extremity supported Standing balance-Leahy Scale: Fair                             ADL either performed  or assessed with clinical judgement   ADL Overall ADL's : Needs assistance/impaired Eating/Feeding: Set up;Sitting   Grooming: Wash/dry face;Sitting   Upper Body Bathing: Set up;Sitting   Lower Body Bathing: Moderate assistance;Sit to/from stand   Upper Body Dressing : Set up;Sitting   Lower Body Dressing: Moderate assistance;Sit to/from stand   Toilet Transfer: Moderate assistance;Minimal assistance;BSC;Cueing for safety;Cueing for sequencing   Toileting- Clothing Manipulation and Hygiene: Maximal assistance;Sit to/from stand                            Pertinent Vitals/Pain Pain Assessment: Faces Pain Score: 3  Faces Pain Scale: Hurts even more Pain Location: bil legs Pain Descriptors / Indicators: Sore;Aching Pain Intervention(s): Repositioned;Monitored during session     Hand Dominance     Extremity/Trunk Assessment Upper Extremity Assessment Upper Extremity Assessment: Generalized weakness   Lower Extremity Assessment Lower Extremity Assessment: Generalized weakness       Communication Communication Communication: No difficulties   Cognition Arousal/Alertness: Awake/alert Behavior During Therapy: WFL for tasks assessed/performed Overall Cognitive Status: Within Functional Limits for tasks assessed                                                Home Living Family/patient expects to be discharged to:: Private residence Living Arrangements: Spouse/significant other Available Help at Discharge: Family;Available 24 hours/day Type of Home: House Home Access: Stairs to  enter Entrance Stairs-Number of Steps: 1   Home Layout: One level     Bathroom Shower/Tub: Tub/shower unit         Home Equipment: [place] - 2 wheels;Cane - single point;Shower seat;Bedside commode          Prior Functioning/Environment Level of Independence: Independent with assistive device(s)        Comments: walks with RW or SPC at baseline, sits on  shower seat to bathe        OT Problem List: Decreased strength;Impaired balance (sitting and/or standing);Decreased activity tolerance      OT Treatment/Interventions: Self-care/ADL training;DME and/or AE instruction;Therapeutic activities;Patient/family education    OT Goals(Current goals can be found in the care plan section) Acute Rehab OT Goals Patient Stated Goal: get well OT Goal Formulation: With patient Time For Goal Achievement: 04/10/20 Potential to Achieve Goals: Good ADL Goals Pt Will Perform Grooming: with supervision;standing Pt Will Perform Lower Body Bathing: with caregiver independent in assisting;with min assist;sit to/from stand Pt Will Perform Lower Body Dressing: with caregiver independent in assisting;with min assist;sit to/from stand Pt Will Transfer to Toilet: with min assist;bedside commode Pt Will Perform Toileting - Clothing Manipulation and hygiene: with min assist  OT Frequency: Min 2X/week    AM-PAC OT "6 Clicks" Daily Activity     Outcome Measure   Help from another person taking care of personal grooming?: None Help from another person toileting, which includes using toliet, bedpan, or urinal?: A Little Help from another person bathing (including washing, rinsing, drying)?: A Little Help from another person to put on and taking off regular upper body clothing?: None Help from another person to put on and taking off regular lower body clothing?: A Little 6 Click Score: 17   End of Session Equipment Utilized During Treatment: Rolling walker Nurse Communication: Mobility status  Activity Tolerance: Patient tolerated treatment well Patient left: in bed  OT Visit Diagnosis: Unsteadiness on feet (R26.81);Muscle weakness (generalized) (M62.81)                Time: 8338-2505 OT Time Calculation (min): 17 min Charges:  OT General Charges $OT Visit: 1 Visit OT Evaluation $OT Eval Moderate Complexity: 1 Mod  Kari Baars, [place] [phone] Office- [place], Edwena Felty D 03/26/2020, 1:46 PM

## 2020-03-27 DIAGNOSIS — K922 Gastrointestinal hemorrhage, unspecified: Secondary | ICD-10-CM | POA: Diagnosis not present

## 2020-03-27 LAB — COMPREHENSIVE METABOLIC PANEL
ALT: 20 U/L (ref 0–44)
AST: 26 U/L (ref 15–41)
Albumin: 2.5 g/dL — ABNORMAL LOW (ref 3.5–5.0)
Alkaline Phosphatase: 51 U/L (ref 38–126)
Anion gap: 8 (ref 5–15)
BUN: 13 mg/dL (ref 8–23)
CO2: 21 mmol/L — ABNORMAL LOW (ref 22–32)
Calcium: 7.9 mg/dL — ABNORMAL LOW (ref 8.9–10.3)
Chloride: 110 mmol/L (ref 98–111)
Creatinine, Ser: 1.18 mg/dL — ABNORMAL HIGH (ref 0.44–1.00)
GFR calc Af Amer: 54 mL/min — ABNORMAL LOW (ref >60)
GFR calc non Af Amer: 47 mL/min — ABNORMAL LOW (ref >60)
Glucose, Bld: 82 mg/dL (ref 70–99)
Potassium: 3.6 mmol/L (ref 3.5–5.1)
Sodium: 139 mmol/L (ref 135–145)
Total Bilirubin: 0.8 mg/dL (ref 0.3–1.2)
Total Protein: 4.9 g/dL — ABNORMAL LOW (ref 6.5–8.1)

## 2020-03-27 LAB — IRON AND TIBC
Iron: 15 ug/dL — ABNORMAL LOW (ref 28–170)
Saturation Ratios: 9 % — ABNORMAL LOW (ref 10.4–31.8)
TIBC: 166 ug/dL — ABNORMAL LOW (ref 250–450)
UIBC: 151 ug/dL

## 2020-03-27 LAB — VITAMIN B12: Vitamin B-12: 1208 pg/mL — ABNORMAL HIGH (ref 180–914)

## 2020-03-27 LAB — FERRITIN: Ferritin: 1684 ng/mL — ABNORMAL HIGH (ref 11–307)

## 2020-03-27 LAB — CBC
HCT: 25.9 % — ABNORMAL LOW (ref 36.0–46.0)
Hemoglobin: 8.2 g/dL — ABNORMAL LOW (ref 12.0–15.0)
MCH: 36.8 pg — ABNORMAL HIGH (ref 26.0–34.0)
MCHC: 31.7 g/dL (ref 30.0–36.0)
MCV: 116.1 fL — ABNORMAL HIGH (ref 80.0–100.0)
Platelets: 105 10*3/uL — ABNORMAL LOW (ref 150–400)
RBC: 2.23 MIL/uL — ABNORMAL LOW (ref 3.87–5.11)
RDW: 18 % — ABNORMAL HIGH (ref 11.5–15.5)
WBC: 4.5 10*3/uL (ref 4.0–10.5)
nRBC: 0 % (ref 0.0–0.2)

## 2020-03-27 NOTE — Progress Notes (Signed)
PROGRESS NOTE    Helen Hardy  KFM:[id number] DOB: Nov 27, 1950 DOA: 03/25/2020 PCP: Deland Pretty, MD    Brief Narrative: 69 year old female with past medical history of diverticulitis, rheumatoid arthritis, recent enteropathogenic E. coli GI infection in June, hypertension, chronic radicular lumbar pain, morbid obesity, non-Hodgkin's lymphoma (now in remission) who was brought in from home by EMS with complaints of constant moderate lower abdominal pain since yesterday with bright red blood per rectum and blood when wiping after a bowel movement.  ED Course: T103F, HR 116, RR 29, normotensive on room air.  Notable labs: BUN 29, creatinine 1.37 (baseline 0.8), calcium 8.9 (ionized calcium 0.88 but normal albumin-lab repeated and pending), COVID-19 negative.  CT abdomen pelvis with contrast most notable for: Segmental wall thickening and fat stranding of the transverse colon as well as base of the cecum consistent with infectious VS inflammatory VS ischemic colitis however favoring Crohn's disease.  Dr. Collene Mares, GI, consulted in the ED and believes this to be Crohn's disease.  She was started on Rocephin, Flagyl and Cipro and GI is to consult.  Assessment & Plan:   Principal Problem:   GI bleed Active Problems:   Rheumatoid arthritis ([place])   Essential hypertension   AKI (acute kidney injury) ([place])   SIRS (systemic inflammatory response syndrome) ([facility])   #1 acute diverticulitis versus infectious colitis-patient presented with high fever abdominal pain bloody diarrhea.  At the time of admission she met sepsis criteria with fever of 103, tachypnea  tachycardia hypotension with no leukocytosis. She has been treated with IV fluids Cipro and Flagyl.   Blood cultures so far no growth. Advance diet to full liquids. Patient still complains of abdominal pain but no nausea vomiting or further bloody bowel movements reported.  CT abdomen and pelvis 03/25/2020 with segmental wall thickening and fat  stranding of the transverse colon as well as the base of the cecum consistent with infectious versus inflammatory versus ischemic colitis however favoring Crohn's disease. Appreciate GI input.  Check GI pathogen panel.  #2 history of rheumatoid arthritis holding steroids in light of sepsis  #3 hypotension resolved now her blood pressure is trending up restart ARB blood pressure 136/107.      Estimated body mass index is 45.6 kg/m as calculated from the following:   Height as of this encounter: 5' (1.524 m).   Weight as of this encounter: 105.9 kg.  DVT prophylaxis: SCD due to bleeding per rectum  code Status: Full code Family Communication: None at bedside  disposition Plan:  Status is: Inpatient  Dispo: The patient is from:home              Anticipated d/c is to: Home              Anticipated d/c date is: More than 2 days              Patient currently is not medically stable to d/c.  Patient admitted with abdominal pain fever and bloody bowel movements on IV fluids and IV antibiotics still on clear liquids    Consultants:   GI  Procedures: None Antimicrobials: Cipro and Flagyl  Subjective: Patient resting in bed continues to complain of ongoing loose BM with blood  Abdominal pain better remains on clear liquids Objective: Vitals:   03/26/20 1734 03/26/20 2214 03/27/20 0559 03/27/20 1413  BP: 130/64 (!) 103/54 111/62 (!) 136/107  Pulse: 96 89 91 84  Resp: 19 17 17 19   Temp: (!) 100.4 F (38 C) 98.4  F (36.9 C) 98.7 F (37.1 C) 98.7 F (37.1 C)  TempSrc: Oral Oral Oral Oral  SpO2: 94% 94% 97% 98%  Weight:      Height:        Intake/Output Summary (Last 24 hours) at 03/27/2020 1523 Last data filed at 03/27/2020 1448 Gross per 24 hour  Intake 1440 ml  Output 950 ml  Net 490 ml   Filed Weights   03/25/20 1325  Weight: 105.9 kg    Examination:  General exam: Appears calm and comfortable  Respiratory system: Clear to auscultation. Respiratory effort  normal. Cardiovascular system: S1 & S2 heard, RRR. No JVD, murmurs, rubs, gallops or clicks. No pedal edema. Gastrointestinal system: Abdomen is nondistended, soft and tender. No organomegaly or masses felt. Normal bowel sounds heard. Central nervous system: Alert and oriented. No focal neurological deficits. Extremities: Symmetric 5 x 5 power. Skin: No rashes, lesions or ulcers Psychiatry: Judgement and insight appear normal. Mood & affect appropriate.     Data Reviewed: I have personally reviewed following labs and imaging studies  CBC: Recent Labs  Lab 03/25/20 0621 03/25/20 0649 03/26/20 0723 03/27/20 0812  WBC 5.8  --  7.5 4.5  NEUTROABS 3.0  --   --   --   HGB 10.5* 10.2* 8.7* 8.2*  HCT 32.9* 30.0* 27.3* 25.9*  MCV 116.7*  --  115.7* 116.1*  PLT 132*  --  115* 235*   Basic Metabolic Panel: Recent Labs  Lab 03/25/20 0621 03/25/20 0649 03/25/20 0701 03/26/20 0723 03/27/20 0812  NA 137 140  --  138 139  K 4.2 4.2  --  4.1 3.6  CL 103 109  --  108 110  CO2 23  --   --  22 21*  GLUCOSE 115* 112*  --  79 82  BUN 29* 28*  --  20 13  CREATININE 1.37* 1.40*  --  1.17* 1.18*  CALCIUM 8.9  --   --  7.8* 7.9*  MG  --   --  2.2  --   --   PHOS  --   --  2.2*  --   --    GFR: Estimated Creatinine Clearance: 48.8 mL/min (A) (by C-G formula based on SCr of 1.18 mg/dL (H)). Liver Function Tests: Recent Labs  Lab 03/25/20 0621 03/27/20 0812  AST 30 26  ALT 25 20  ALKPHOS 56 51  BILITOT 0.5 0.8  PROT 6.5 4.9*  ALBUMIN 3.7 2.5*   No results for input(s): LIPASE, AMYLASE in the last 168 hours. No results for input(s): AMMONIA in the last 168 hours. Coagulation Profile: No results for input(s): INR, PROTIME in the last 168 hours. Cardiac Enzymes: No results for input(s): CKTOTAL, CKMB, CKMBINDEX, TROPONINI in the last 168 hours. BNP (last 3 results) No results for input(s): PROBNP in the last 8760 hours. HbA1C: No results for input(s): HGBA1C in the last 72  hours. CBG: No results for input(s): GLUCAP in the last 168 hours. Lipid Profile: No results for input(s): CHOL, HDL, LDLCALC, TRIG, CHOLHDL, LDLDIRECT in the last 72 hours. Thyroid Function Tests: No results for input(s): TSH, T4TOTAL, FREET4, T3FREE, THYROIDAB in the last 72 hours. Anemia Panel: Recent Labs    03/27/20 0812  VITAMINB12 1,208*  FERRITIN 1,684*  TIBC 166*  IRON 15*   Sepsis Labs: Recent Labs  Lab 03/25/20 0620  LATICACIDVEN 1.6    Recent Results (from the past 240 hour(s))  Culture, blood (routine x 2)     Status:  None (Preliminary result)   Collection Time: 03/25/20  6:10 AM   Specimen: BLOOD RIGHT HAND  Result Value Ref Range Status   Specimen Description   Final    BLOOD RIGHT HAND Performed at [facility] [address] [place], [place] [address]    Special Requests   Final    BOTTLES DRAWN AEROBIC AND ANAEROBIC Blood Culture adequate volume Performed at [facility] [address], [place], [place] [address]    Culture   Final    NO GROWTH 2 DAYS Performed at [facility] [address], [place], [place] [address]    Report Status PENDING  Incomplete  Culture, blood (routine x 2)     Status: None (Preliminary result)   Collection Time: 03/25/20  6:20 AM   Specimen: BLOOD  Result Value Ref Range Status   Specimen Description   Final    BLOOD RIGHT ANTECUBITAL Performed at [facility] [address], [place], [place] [address]    Special Requests   Final    BOTTLES DRAWN AEROBIC AND ANAEROBIC Blood Culture adequate volume Performed at [facility] [address], [place], [place] [address]    Culture   Final    NO GROWTH 2 DAYS Performed at [facility] [address], [place], [place] [address]    Report Status PENDING  Incomplete  SARS Coronavirus 2 by RT PCR (hospital order, performed in [facility] hospital lab) Nasopharyngeal  Nasopharyngeal Swab     Status: None   Collection Time: 03/25/20  6:22 AM   Specimen: Nasopharyngeal Swab  Result Value Ref Range Status   SARS Coronavirus 2 NEGATIVE NEGATIVE Final    Comment: (NOTE) SARS-CoV-2 target nucleic acids are NOT DETECTED.  The SARS-CoV-2 RNA is generally detectable in upper and lower respiratory specimens during the acute phase of infection. The lowest concentration of SARS-CoV-2 viral copies this assay can detect is 250 copies / mL. A negative result does not preclude SARS-CoV-2 infection and should not be used as the sole basis for treatment or other patient management decisions.  A negative result may occur with improper specimen collection / handling, submission of specimen other than nasopharyngeal swab, presence of viral mutation(s) within the areas targeted by this assay, and inadequate number of viral copies (<250 copies / mL). A negative result must be combined with clinical observations, patient history, and epidemiological information.  Fact Sheet for Patients:   StrictlyIdeas.no  Fact Sheet for Healthcare Providers: BankingDealers.co.za  This test is not yet approved or  cleared by the [place] FDA and has been authorized for detection and/or diagnosis of SARS-CoV-2 by FDA under an Emergency Use Authorization (EUA).  This EUA will remain in effect (meaning this test can be used) for the duration of the COVID-19 declaration under Section 564(b)(1) of the Act, 21 U.S.C. section 360bbb-3(b)(1), unless the authorization is terminated or revoked sooner.  Performed at [facility], [place] [address] [place], [place] [address]          Radiology Studies: No results found.      Scheduled Meds: . estradiol  1 mg Oral Daily  . folic acid  2 mg Oral Daily  . gabapentin  300 mg Oral Daily   And  . gabapentin  600 mg Oral QHS  . pantoprazole  40 mg Oral Daily  .  saccharomyces boulardii  250 mg Oral BID  . sodium chloride flush  3 mL Intravenous Q12H  . vitamin B-12  1,000 mcg Oral Daily   Continuous Infusions: . sodium chloride 75 mL/hr at 03/27/20 0613  . ciprofloxacin 400 mg (03/27/20 1002)  . metronidazole 500 mg (03/27/20 0743)     LOS: 2 days     Georgette Shell, MD  03/27/2020, 3:23 PM

## 2020-03-27 NOTE — Progress Notes (Signed)
Subjective: Since I last evaluated the patient, she seems to be doing somewhat better.  Her abdominal pain is improved and diarrhea has slowed down somewhat.  Denies having any nausea vomiting or fever. Her hemoglobin is dropped from 10.5 g/dL on admission to 8.2 g/dL today.  There is no history of melena hematochezia today.  She had rectal bleeding with the diarrhea when she presented to the ER on Monday.  Objective: Vital signs in last 24 hours: Temp:  [98.4 F (36.9 C)-100.4 F (38 C)] 98.7 F (37.1 C) (09/22 1413) Pulse Rate:  [84-96] 84 (09/22 1413) Resp:  [17-19] 19 (09/22 1413) BP: (103-136)/(54-107) 136/107 (09/22 1413) SpO2:  [94 %-98 %] 98 % (09/22 1413) Last BM Date: 03/27/20  Intake/Output from previous day: 09/21 0701 - 09/22 0700 In: 1680 [P.O.:480; I.V.:900; IV Piggyback:300] Out: 1600 [Urine:1600] Intake/Output this shift: Total I/O In: -  Out: 350 [Stool:350]  General appearance: alert, cooperative, appears stated age, no distress and moderately obese Resp: clear to auscultation bilaterally Cardio: regular rate and rhythm, S1, S2 normal, no murmur, click, rub or gallop GI: soft, mild diffuse tenderness on palpation in the periumbilical area; bowel sounds normal; no masses,  no organomegaly  Lab Results: Recent Labs    03/25/20 0621 03/25/20 0621 03/25/20 0649 03/26/20 0723 03/27/20 0812  WBC 5.8  --   --  7.5 4.5  HGB 10.5*   < > 10.2* 8.7* 8.2*  HCT 32.9*   < > 30.0* 27.3* 25.9*  PLT 132*  --   --  115* 105*   < > = values in this interval not displayed.   BMET Recent Labs    03/25/20 0621 03/25/20 0621 03/25/20 0649 03/26/20 0723 03/27/20 0812  NA 137   < > 140 138 139  K 4.2   < > 4.2 4.1 3.6  CL 103   < > 109 108 110  CO2 23  --   --  22 21*  GLUCOSE 115*   < > 112* 79 82  BUN 29*   < > 28* 20 13  CREATININE 1.37*   < > 1.40* 1.17* 1.18*  CALCIUM 8.9  --   --  7.8* 7.9*   < > = values in this interval not displayed.   LFT Recent Labs     03/27/20 0812  PROT 4.9*  ALBUMIN 2.5*  AST 26  ALT 20  ALKPHOS 51  BILITOT 0.8    Medications: I have reviewed the patient's current medications.  Assessment/Plan: Acute infectious colitis/abnormal CT scan with diarrhea and blood in stool and anemia with hemoglobin of 8.2 gms/dl.  Continue present care with antibiotics.  Patient will need follow-up with Dr. Benson Norway after discharge for further evaluation.  LOS: 2 days   Juanita Craver 03/27/2020, 2:53 PM

## 2020-03-28 DIAGNOSIS — K5751 Diverticulosis of both small and large intestine without perforation or abscess with bleeding: Secondary | ICD-10-CM | POA: Diagnosis not present

## 2020-03-28 DIAGNOSIS — M1990 Unspecified osteoarthritis, unspecified site: Secondary | ICD-10-CM | POA: Diagnosis not present

## 2020-03-28 DIAGNOSIS — K5000 Crohn's disease of small intestine without complications: Secondary | ICD-10-CM | POA: Diagnosis not present

## 2020-03-28 DIAGNOSIS — K922 Gastrointestinal hemorrhage, unspecified: Secondary | ICD-10-CM | POA: Diagnosis not present

## 2020-03-28 DIAGNOSIS — M069 Rheumatoid arthritis, unspecified: Secondary | ICD-10-CM | POA: Diagnosis not present

## 2020-03-28 LAB — GASTROINTESTINAL PANEL BY PCR, STOOL (REPLACES STOOL CULTURE)

## 2020-03-28 LAB — CBC
HCT: 27.9 % — ABNORMAL LOW (ref 36.0–46.0)
Hemoglobin: 8.8 g/dL — ABNORMAL LOW (ref 12.0–15.0)
MCH: 36.4 pg — ABNORMAL HIGH (ref 26.0–34.0)
MCHC: 31.5 g/dL (ref 30.0–36.0)
MCV: 115.3 fL — ABNORMAL HIGH (ref 80.0–100.0)
Platelets: 103 10*3/uL — ABNORMAL LOW (ref 150–400)
RBC: 2.42 MIL/uL — ABNORMAL LOW (ref 3.87–5.11)
RDW: 17.2 % — ABNORMAL HIGH (ref 11.5–15.5)
WBC: 3.7 10*3/uL — ABNORMAL LOW (ref 4.0–10.5)
nRBC: 0 % (ref 0.0–0.2)

## 2020-03-28 LAB — COMPREHENSIVE METABOLIC PANEL
ALT: 20 U/L (ref 0–44)
AST: 29 U/L (ref 15–41)
Albumin: 2.7 g/dL — ABNORMAL LOW (ref 3.5–5.0)
Alkaline Phosphatase: 54 U/L (ref 38–126)
Anion gap: 10 (ref 5–15)
BUN: 9 mg/dL (ref 8–23)
CO2: 22 mmol/L (ref 22–32)
Calcium: 8.2 mg/dL — ABNORMAL LOW (ref 8.9–10.3)
Chloride: 108 mmol/L (ref 98–111)
Creatinine, Ser: 1.1 mg/dL — ABNORMAL HIGH (ref 0.44–1.00)
GFR calc Af Amer: 59 mL/min — ABNORMAL LOW (ref >60)
GFR calc non Af Amer: 51 mL/min — ABNORMAL LOW (ref >60)
Glucose, Bld: 87 mg/dL (ref 70–99)
Potassium: 3.9 mmol/L (ref 3.5–5.1)
Sodium: 140 mmol/L (ref 135–145)
Total Bilirubin: 0.8 mg/dL (ref 0.3–1.2)
Total Protein: 5.2 g/dL — ABNORMAL LOW (ref 6.5–8.1)

## 2020-03-28 LAB — LACTOFERRIN, FECAL, QUALITATIVE: Lactoferrin, Fecal, Qual: POSITIVE — AB

## 2020-03-28 NOTE — Care Management Important Message (Signed)
Important Message  Patient Details IM Letter given to the Patient Name: Helen Hardy MRN: [id number] Date of Birth: 22-Jul-1950   Medicare Important Message Given:  Yes     Kerin Salen 03/28/2020, 9:39 AM

## 2020-03-28 NOTE — Progress Notes (Signed)
Subjective: Feeling better.  Objective: Vital signs in last 24 hours: Temp:  [99.2 F (37.3 C)-100 F (37.8 C)] 99.8 F (37.7 C) (09/23 1446) Pulse Rate:  [77-84] 77 (09/23 1446) Resp:  [17-18] 18 (09/23 1446) BP: (131-146)/(66-85) 146/66 (09/23 1446) SpO2:  [94 %-97 %] 97 % (09/23 1446) Last BM Date: 03/28/20  Intake/Output from previous day: 09/22 0701 - 09/23 0700 In: -  Out: 350 [Stool:350] Intake/Output this shift: Total I/O In: -  Out: 650 [Urine:500; Stool:150]  General appearance: alert and no distress GI: soft, some tenderness, but improving  Lab Results: Recent Labs    03/26/20 0723 03/27/20 0812 03/28/20 0606  WBC 7.5 4.5 3.7*  HGB 8.7* 8.2* 8.8*  HCT 27.3* 25.9* 27.9*  PLT 115* 105* 103*   BMET Recent Labs    03/26/20 0723 03/27/20 0812 03/28/20 0606  NA 138 139 140  K 4.1 3.6 3.9  CL 108 110 108  CO2 22 21* 22  GLUCOSE 79 82 87  BUN 20 13 9   CREATININE 1.17* 1.18* 1.10*  CALCIUM 7.8* 7.9* 8.2*   LFT Recent Labs    03/28/20 0606  PROT 5.2*  ALBUMIN 2.7*  AST 29  ALT 20  ALKPHOS 54  BILITOT 0.8   PT/INR No results for input(s): LABPROT, INR in the last 72 hours. Hepatitis Panel No results for input(s): HEPBSAG, HCVAB, HEPAIGM, HEPBIGM in the last 72 hours. C-Diff No results for input(s): CDIFFTOX in the last 72 hours. Fecal Lactopherrin No results for input(s): FECLLACTOFRN in the last 72 hours.  Studies/Results: No results found.  Medications:  Scheduled: . estradiol  1 mg Oral Daily  . folic acid  2 mg Oral Daily  . gabapentin  300 mg Oral Daily   And  . gabapentin  600 mg Oral QHS  . pantoprazole  40 mg Oral Daily  . saccharomyces boulardii  250 mg Oral BID  . sodium chloride flush  3 mL Intravenous Q12H  . vitamin B-12  1,000 mcg Oral Daily   Continuous: . sodium chloride 75 mL/hr at 03/27/20 0613  . ciprofloxacin 400 mg (03/28/20 1145)    Assessment/Plan: 1) Enteropathogenic E coli. 2) Diarrhea. 3)  Abdominal pain.   The patient is infected again with the same type of E. Coli.  Previously she responded well to azithromycin after being treated with Zosyn (June admission).  Cipro is also used to treat this bacteria.  She is making slow improvement and she is taking the same clinical course as she did in June.  Plan: 1) Continue with Cipro for now. 2) Rectal tube out tomorrow.  This can cause a rectal ulcer and precipitate bleeding.  LOS: 3 days   Helen Hardy D 03/28/2020, 3:10 PM

## 2020-03-28 NOTE — Progress Notes (Signed)
PROGRESS NOTE    Helen Hardy  YIA:[id number] DOB: [birth year] DOA: 03/25/2020 PCP: Deland Pretty, MD    Brief Narrative: 69 year old female with past medical history of diverticulitis, rheumatoid arthritis, recent enteropathogenic E. coli GI infection in June, hypertension, chronic radicular lumbar pain, morbid obesity, non-Hodgkin's lymphoma (now in remission) who was brought in from home by EMS with complaints of constant moderate lower abdominal pain since yesterday with bright red blood per rectum and blood when wiping after a bowel movement.  ED Course: T103F, HR 116, RR 29, normotensive on room air.  Notable labs: BUN 29, creatinine 1.37 (baseline 0.8), calcium 8.9 (ionized calcium 0.88 but normal albumin-lab repeated and pending), COVID-19 negative.  CT abdomen pelvis with contrast most notable for: Segmental wall thickening and fat stranding of the transverse colon as well as base of the cecum consistent with infectious VS inflammatory VS ischemic colitis however favoring Crohn's disease.  Dr. Collene Mares, GI, consulted in the ED and believes this to be Crohn's disease.  She was started on Rocephin, Flagyl and Cipro and GI is to consult.  Assessment & Plan:   Principal Problem:   GI bleed Active Problems:   Rheumatoid arthritis ([place])   Essential hypertension   AKI (acute kidney injury) ([place])   SIRS (systemic inflammatory response syndrome) ([facility])   #1 acute diverticulitis versus infectious colitis-patient presented with high fever abdominal pain bloody diarrhea.  At the time of admission she met sepsis criteria with fever of 103, tachypnea  tachycardia hypotension with no leukocytosis. She has been treated with IV fluids Cipro and Flagyl.   Blood cultures so far no growth. Advance diet to full liquids. Patient still complains of abdominal pain but no nausea vomiting or further bloody bowel movements reported.  CT abdomen and pelvis 03/25/2020 with segmental wall thickening and fat  stranding of the transverse colon as well as the base of the cecum consistent with infectious versus inflammatory versus ischemic colitis however favoring Crohn's disease. Appreciate GI input.  Check GI pathogen panel.  #2 history of rheumatoid arthritis holding steroids in light of sepsis  #3 hypotension resolved now her blood pressure is trending up restart ARB blood pressure 136/107.      Estimated body mass index is 45.6 kg/m as calculated from the following:   Height as of this encounter: 5' (1.524 m).   Weight as of this encounter: 105.9 kg.  DVT prophylaxis: SCD due to bleeding per rectum  code Status: Full code Family Communication: None at bedside  disposition Plan:  Status is: Inpatient  Dispo: The patient is from:home              Anticipated d/c is to: Home              Anticipated d/c date is: More than 2 days              Patient currently is not medically stable to d/c.  Patient admitted with abdominal pain fever and bloody bowel movements on IV fluids and IV antibiotics still on clear liquids    Consultants:   GI  Procedures: None Antimicrobials: Cipro and Flagyl  Subjective: Patient still continues to have loose BMs dark Hemoglobin remained stable at 8.8 Her abdominal pain is better remains on clear liquids  Objective: Vitals:   03/27/20 0559 03/27/20 1413 03/27/20 2047 03/28/20 0541  BP: 111/62 (!) 136/107 131/85 (!) 146/85  Pulse: 91 84 84 82  Resp: 17 19 17 17   Temp: 98.7 F (37.1 C)  98.7 F (37.1 C) 100 F (37.8 C) 99.2 F (37.3 C)  TempSrc: Oral Oral Oral Oral  SpO2: 97% 98% 97% 94%  Weight:      Height:        Intake/Output Summary (Last 24 hours) at 03/28/2020 1238 Last data filed at 03/28/2020 1019 Gross per 24 hour  Intake --  Output 500 ml  Net -500 ml   Filed Weights   03/25/20 1325  Weight: 105.9 kg    Examination:  General exam: Appears calm and comfortable  Respiratory system: Clear to auscultation. Respiratory effort  normal. Cardiovascular system: S1 & S2 heard, RRR. No JVD, murmurs, rubs, gallops or clicks. No pedal edema. Gastrointestinal system: Abdomen is nondistended, soft and tender. No organomegaly or masses felt. Normal bowel sounds heard. Central nervous system: Alert and oriented. No focal neurological deficits. Extremities: Symmetric 5 x 5 power. Skin: No rashes, lesions or ulcers Psychiatry: Judgement and insight appear normal. Mood & affect appropriate.     Data Reviewed: I have personally reviewed following labs and imaging studies  CBC: Recent Labs  Lab 03/25/20 0621 03/25/20 0649 03/26/20 0723 03/27/20 0812 03/28/20 0606  WBC 5.8  --  7.5 4.5 3.7*  NEUTROABS 3.0  --   --   --   --   HGB 10.5* 10.2* 8.7* 8.2* 8.8*  HCT 32.9* 30.0* 27.3* 25.9* 27.9*  MCV 116.7*  --  115.7* 116.1* 115.3*  PLT 132*  --  115* 105* 852*   Basic Metabolic Panel: Recent Labs  Lab 03/25/20 0621 03/25/20 0649 03/25/20 0701 03/26/20 0723 03/27/20 0812 03/28/20 0606  NA 137 140  --  138 139 140  K 4.2 4.2  --  4.1 3.6 3.9  CL 103 109  --  108 110 108  CO2 23  --   --  22 21* 22  GLUCOSE 115* 112*  --  79 82 87  BUN 29* 28*  --  20 13 9   CREATININE 1.37* 1.40*  --  1.17* 1.18* 1.10*  CALCIUM 8.9  --   --  7.8* 7.9* 8.2*  MG  --   --  2.2  --   --   --   PHOS  --   --  2.2*  --   --   --    GFR: Estimated Creatinine Clearance: 52.4 mL/min (A) (by C-G formula based on SCr of 1.1 mg/dL (H)). Liver Function Tests: Recent Labs  Lab 03/25/20 0621 03/27/20 0812 03/28/20 0606  AST 30 26 29   ALT 25 20 20   ALKPHOS 56 51 54  BILITOT 0.5 0.8 0.8  PROT 6.5 4.9* 5.2*  ALBUMIN 3.7 2.5* 2.7*   No results for input(s): LIPASE, AMYLASE in the last 168 hours. No results for input(s): AMMONIA in the last 168 hours. Coagulation Profile: No results for input(s): INR, PROTIME in the last 168 hours. Cardiac Enzymes: No results for input(s): CKTOTAL, CKMB, CKMBINDEX, TROPONINI in the last 168  hours. BNP (last 3 results) No results for input(s): PROBNP in the last 8760 hours. HbA1C: No results for input(s): HGBA1C in the last 72 hours. CBG: No results for input(s): GLUCAP in the last 168 hours. Lipid Profile: No results for input(s): CHOL, HDL, LDLCALC, TRIG, CHOLHDL, LDLDIRECT in the last 72 hours. Thyroid Function Tests: No results for input(s): TSH, T4TOTAL, FREET4, T3FREE, THYROIDAB in the last 72 hours. Anemia Panel: Recent Labs    03/27/20 0812  VITAMINB12 1,208*  FERRITIN 1,684*  TIBC 166*  IRON 15*  Sepsis Labs: Recent Labs  Lab 03/25/20 0620  LATICACIDVEN 1.6    Recent Results (from the past 240 hour(s))  Culture, blood (routine x 2)     Status: None (Preliminary result)   Collection Time: 03/25/20  6:10 AM   Specimen: BLOOD RIGHT HAND  Result Value Ref Range Status   Specimen Description   Final    BLOOD RIGHT HAND Performed at [facility], [place] [address] [place], [place] [address]    Special Requests   Final    BOTTLES DRAWN AEROBIC AND ANAEROBIC Blood Culture adequate volume Performed at [facility] [address], [place], [place] [address]    Culture   Final    NO GROWTH 3 DAYS Performed at [facility], [place] [address], [place], [place] [address]    Report Status PENDING  Incomplete  Culture, blood (routine x 2)     Status: None (Preliminary result)   Collection Time: 03/25/20  6:20 AM   Specimen: BLOOD  Result Value Ref Range Status   Specimen Description   Final    BLOOD RIGHT ANTECUBITAL Performed at [facility] [address], [place], [place] [address]    Special Requests   Final    BOTTLES DRAWN AEROBIC AND ANAEROBIC Blood Culture adequate volume Performed at [facility] [address] [place], [place] [address]    Culture   Final    NO GROWTH 3 DAYS Performed at [facility], [place] [address] [place], [place] [address]    Report  Status PENDING  Incomplete  SARS Coronavirus 2 by RT PCR (hospital order, performed in [facility] hospital lab) Nasopharyngeal Nasopharyngeal Swab     Status: None   Collection Time: 03/25/20  6:22 AM   Specimen: Nasopharyngeal Swab  Result Value Ref Range Status   SARS Coronavirus 2 NEGATIVE NEGATIVE Final    Comment: (NOTE) SARS-CoV-2 target nucleic acids are NOT DETECTED.  The SARS-CoV-2 RNA is generally detectable in upper and lower respiratory specimens during the acute phase of infection. The lowest concentration of SARS-CoV-2 viral copies this assay can detect is 250 copies / mL. A negative result does not preclude SARS-CoV-2 infection and should not be used as the sole basis for treatment or other patient management decisions.  A negative result may occur with improper specimen collection / handling, submission of specimen other than nasopharyngeal swab, presence of viral mutation(s) within the areas targeted by this assay, and inadequate number of viral copies (<250 copies / mL). A negative result must be combined with clinical observations, patient history, and epidemiological information.  Fact Sheet for Patients:   StrictlyIdeas.no  Fact Sheet for Healthcare Providers: BankingDealers.co.za  This test is not yet approved or  cleared by the [place] FDA and has been authorized for detection and/or diagnosis of SARS-CoV-2 by FDA under an Emergency Use Authorization (EUA).  This EUA will remain in effect (meaning this test can be used) for the duration of the COVID-19 declaration under Section 564(b)(1) of the Act, 21 U.S.C. section 360bbb-3(b)(1), unless the authorization is terminated or revoked sooner.  Performed at [facility], [place] [address], [place],  [address]          Radiology Studies: No results found.      Scheduled Meds: . estradiol  1 mg Oral Daily  . folic acid  2  mg Oral Daily  . gabapentin  300 mg Oral Daily  And  . gabapentin  600 mg Oral QHS  . pantoprazole  40 mg Oral Daily  . saccharomyces boulardii  250 mg Oral BID  . sodium chloride flush  3 mL Intravenous Q12H  . vitamin B-12  1,000 mcg Oral Daily   Continuous Infusions: . sodium chloride 75 mL/hr at 03/27/20 0613  . ciprofloxacin 400 mg (03/28/20 1145)  . metronidazole 500 mg (03/28/20 0841)     LOS: 3 days     Georgette Shell, MD  03/28/2020, 12:38 PM

## 2020-03-28 NOTE — TOC Initial Note (Signed)
Transition of Care [facility]) - Initial/Assessment Note    Patient Details  Name: Helen Hardy MRN: [id number] Date of Birth: 13-Jul-1950  [facility] CM/SW Contact:    Dondrell Loudermilk, Marjie Skiff, RN Phone Number: 03/28/2020, 2:27 PM  Clinical Narrative:                 Pt from home with spouse and was active with [facility] for HHPT. New home health orders will need to be placed if patient needs to continue with home health services at dc.  Expected Discharge Plan: [place] Barriers to Discharge: Continued Medical Work up   Patient Goals and CMS Choice        Expected Discharge Plan and Services Expected Discharge Plan: [place]   Discharge Planning Services: CM Consult   Living arrangements for the past 2 months: Single Family Home                               Date [place]: 03/28/20 Time HH Agency Contacted: 1210 Representative spoke with at [place]: Santiago Glad  Prior Living Arrangements/Services Living arrangements for the past 2 months: [place] Lives with:: Spouse          Need for Family Participation in Patient Care: Yes (Comment) Care giver support system in place?: Yes (comment)      Activities of Daily Living Home Assistive Devices/Equipment: Cane (specify quad or straight), Shower chair with back, Environmental consultant (specify type), Eyeglasses (single point cane, front wheeled walker, reading glasses) ADL Screening (condition at time of admission) Patient's cognitive ability adequate to safely complete daily activities?: Yes Is the patient deaf or have difficulty hearing?: No Does the patient have difficulty seeing, even when wearing glasses/contacts?: No Does the patient have difficulty concentrating, remembering, or making decisions?: No Patient able to express need for assistance with ADLs?: Yes Does the patient have difficulty dressing or bathing?: No Independently performs ADLs?: Yes (appropriate for  developmental age) Does the patient have difficulty walking or climbing stairs?: Yes Weakness of Legs: Right (has numbness and tingling of right leg) Weakness of Arms/Hands: None  Permission Sought/Granted                  Emotional Assessment Appearance:: Appears stated age            Admission diagnosis:  GI bleed [K92.2] Crohn's disease of colon with rectal bleeding ([place]) [K50.111] Patient Active Problem List   Diagnosis Date Noted  . GI bleed 03/25/2020  . SIRS (systemic inflammatory response syndrome) ([place]) 03/25/2020  . 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 Rheumatoid arthritis ([place]) 12/25/2019  . GERD (gastroesophageal reflux disease) 12/25/2019  . Essential hypertension 12/25/2019  . AKI (acute kidney injury) ([place]) 12/25/2019  . Hyperglycemia 12/25/2019  . Chronic radicular lumbar pain 12/25/2019  . Generalized weakness 12/25/2019  . Anemia of chronic renal failure, stage 3 (moderate) 12/30/2018  . 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 Iron deficiency anemia 06/21/2015  . 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 Morbid obesity ([place]) 04/10/2015  . 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 S/P knee replacement 04/08/2015   PCP:  Deland Pretty, MD Pharmacy:   [facility] [address] [place]), [place] - [address] [place] ([place]) [place] [address] Phone: [phone] Fax: [phone]     Social Determinants of Health (SDOH) Interventions  Readmission Risk Interventions No flowsheet data found.

## 2020-03-28 NOTE — Progress Notes (Signed)
Pharmacy Antibiotic Note  Helen Hardy is a 69 y.o. female admitted on 03/25/2020 with IAI.  Pharmacy has been consulted for ciprofloxacin dosing.  Today, 03/28/20 -WBC 3.7, afebrile -SCr 1.1, CrCl ~52 mL/min  Today is day #4 of IV antibiotics on ciprofloxacin + metronidazole. GI following.  Plan:  Continue ciprofloxacin 400 mg IV q12h  Metronidazole 500 mg IV q8h  Renal function stable, pharmacy to sign off. Please re-consult if needed.  Height: 5' (152.4 cm) Weight: 105.9 kg (233 lb 7.5 oz) IBW/kg (Calculated) : 45.5  Temp (24hrs), Avg:99.3 F (37.4 C), Min:98.7 F (37.1 C), Max:100 F (37.8 C)  Recent Labs  Lab 03/25/20 0620 03/25/20 0621 03/25/20 0649 03/26/20 0723 03/27/20 0812 03/28/20 0606  WBC  --  5.8  --  7.5 4.5 3.7*  CREATININE  --  1.37* 1.40* 1.17* 1.18* 1.10*  LATICACIDVEN 1.6  --   --   --   --   --     Estimated Creatinine Clearance: 52.4 mL/min (A) (by C-G formula based on SCr of 1.1 mg/dL (H)).    Allergies  Allergen Reactions  . Oxycodone Nausea And Vomiting  . Tramadol Other (See Comments)    UNKNOWN REACTION    Microbiology: -9/20 BCx: ngtd -9/21 GI panel: Ordered  Thank you for allowing pharmacy to be a part of this patient's care.  Lenis Noon, PharmD 03/28/20 9:57 AM

## 2020-03-28 NOTE — Progress Notes (Signed)
Occupational Therapy Treatment Patient Details Name: Helen Hardy MRN: [id number] DOB: 1951/03/03 Today's Date: 03/28/2020    History of present illness 69 y.o. female with medical history significant of non-Hodgkin's lymphoma (dx in 2004) in remission, rheumatoid arthritis since age 21, chronic pain, hypertension, GERD, osteoarthritis, lumbar radiculopathy, morbid obesity, anemia of chronic disease and admitted for abdominal pain with some rectal bleeding with bowel movements   OT comments  Treatment focused on activity tolerance and participation and improvement in ADLs. Patient min assist to transfer to side of bed, min assist to rise from bed, min assist for toileting and set up for grooming seated in recliner. Patient required increased time for all tasks. Patient's IV started bleeding during treatment and RN in room to assess and remove. Patient limited by rectal tube and continued decrease in activity tolerance. Continue POC.   Follow Up Recommendations  Home health OT;Supervision/Assistance - 24 hour    Equipment Recommendations       Recommendations for Other Services      Precautions / Restrictions Precautions Precautions: Fall Precaution Comments: rectal pouch Restrictions Weight Bearing Restrictions: No       Mobility Bed Mobility Overal bed mobility: Needs Assistance Bed Mobility: Supine to Sit     Supine to sit: Min assist Sit to supine: Mod assist   General bed mobility comments: Min assist for hand hold to transfer to side of bed.  Transfers Overall transfer level: Needs assistance Equipment used: Rolling walker (2 wheeled) Transfers: Sit to/from [facility] Sit to Stand: Min assist Stand pivot transfers: Min guard       General transfer comment: Min assist to stand from bed height. Min guard to transfer to BSc and then to recliner with RW.    Balance Overall balance assessment: Mild deficits observed, not formally tested                                          ADL either performed or assessed with clinical judgement   ADL Overall ADL's : Needs assistance/impaired     Grooming: Oral care;Wash/dry face;Wash/dry hands;Sitting;Set up Grooming Details (indicate cue type and reason): seated in recliner. Reports unable to stand at sink.                 Toilet Transfer: RW;BSC;Stand-pivot;Min guard   Writer and Hygiene: Set up;Min guard Toileting - Clothing Manipulation Details (indicate cue type and reason): min guard. patinet able to lean on rw to perform pericare.             Vision       Perception     Praxis      Cognition Arousal/Alertness: Awake/alert Behavior During Therapy: WFL for tasks assessed/performed Overall Cognitive Status: Within Functional Limits for tasks assessed                                          Exercises     Shoulder Instructions       General Comments      Pertinent Vitals/ Pain       Pain Assessment: No/denies pain Faces Pain Scale: Hurts even more Pain Location: abdomen Pain Descriptors / Indicators: Sore;Aching Pain Intervention(s): Repositioned;Monitored during session  Home Living  Prior Functioning/Environment              Frequency  Min 2X/week        Progress Toward Goals  OT Goals(current goals can now be found in the care plan section)  Progress towards OT goals: Progressing toward goals  Acute Rehab OT Goals Patient Stated Goal: get well OT Goal Formulation: With patient Time For Goal Achievement: 04/10/20 Potential to Achieve Goals: Good  Plan Discharge plan remains appropriate    Co-evaluation                 AM-PAC OT "6 Clicks" Daily Activity     Outcome Measure   Help from another person eating meals?: None Help from another person taking care of personal grooming?: A Little Help from  another person toileting, which includes using toliet, bedpan, or urinal?: A Little Help from another person bathing (including washing, rinsing, drying)?: A Lot Help from another person to put on and taking off regular upper body clothing?: None Help from another person to put on and taking off regular lower body clothing?: A Little 6 Click Score: 19    End of Session Equipment Utilized During Treatment: Rolling walker  OT Visit Diagnosis: Unsteadiness on feet (R26.81);Muscle weakness (generalized) (M62.81)   Activity Tolerance Patient tolerated treatment well   Patient Left in chair;with call bell/phone within reach;with nursing/sitter in room   Nurse Communication Mobility status        Time: 5916-3846 OT Time Calculation (min): 39 min  Charges: OT General Charges $OT Visit: 1 Visit OT Treatments $Self Care/Home Management : 38-52 mins  Kaytie Ratcliffe, OTR/L [place]  Office [phone] Pager: [place] 03/28/2020, 1:57 PM

## 2020-03-28 NOTE — Progress Notes (Signed)
Physical Therapy Treatment Patient Details Name: Helen Hardy MRN: [id number] DOB: 10-21-1950 Today's Date: 03/28/2020    History of Present Illness 69 y.o. female with medical history significant of non-Hodgkin's lymphoma (dx in 2004) in remission, rheumatoid arthritis since age 24, chronic pain, hypertension, GERD, osteoarthritis, lumbar radiculopathy, morbid obesity, anemia of chronic disease and admitted for abdominal pain with some rectal bleeding with bowel movements    PT Comments    Pt up in recliner on arrival (had worked with OT this morning).  Pt agreeable to ambulate in room however upon standing, pt was dizzy and had truncal sway.  Pt requested return to bed (IV team also coming).  Pt requiring min assist for stability.    Follow Up Recommendations  Home health PT     Equipment Recommendations  None recommended by PT    Recommendations for Other Services       Precautions / Restrictions Precautions Precautions: Fall Precaution Comments: rectal pouch    Mobility  Bed Mobility Overal bed mobility: Needs Assistance Bed Mobility: Sit to Supine       Sit to supine: Mod assist   General bed mobility comments: pt requested assist for LEs onto bed  Transfers Overall transfer level: Needs assistance Equipment used: Rolling walker (2 wheeled) Transfers: Sit to/from [facility] Sit to Stand: Min assist Stand pivot transfers: Min assist       General transfer comment: assist to stabilize, pt dizzy upon standing from recliner and requested back to bed  Ambulation/Gait             General Gait Details: pt declined   Marine scientist [place] (Stroke Patients Only)       Balance                                            Cognition Arousal/Alertness: Awake/alert Behavior During Therapy: WFL for tasks assessed/performed Overall Cognitive Status: Within Functional Limits  for tasks assessed                                        Exercises      General Comments        Pertinent Vitals/Pain Pain Assessment: Faces Faces Pain Scale: Hurts even more Pain Location: abdomen Pain Descriptors / Indicators: Sore;Aching Pain Intervention(s): Repositioned;Monitored during session    Home Living                      Prior Function            PT Goals (current goals can now be found in the care plan section) Progress towards PT goals: Progressing toward goals    Frequency           PT Plan Current plan remains appropriate    Co-evaluation              AM-PAC PT "6 Clicks" Mobility   Outcome Measure  Help needed turning from your back to your side while in a flat bed without using bedrails?: A Little Help needed moving from lying on your back to sitting on the side of a flat bed without using bedrails?: A Little Help needed  moving to and from a bed to a chair (including a wheelchair)?: A Little Help needed standing up from a chair using your arms (e.g., wheelchair or bedside chair)?: A Little Help needed to walk in hospital room?: A Little Help needed climbing 3-5 steps with a railing? : A Lot 6 Click Score: 17    End of Session Equipment Utilized During Treatment: Gait belt Activity Tolerance: Patient limited by fatigue Patient left: in bed;with call bell/phone within reach;with bed alarm set   PT Visit Diagnosis: Difficulty in walking, not elsewhere classified (R26.2);Muscle weakness (generalized) (M62.81)     Time: 4709-2957 PT Time Calculation (min) (ACUTE ONLY): 15 min  Charges:  $Therapeutic Activity: 8-22 mins                    Jannette Spanner PT, DPT Acute Rehabilitation Services Pager: [phone] Office: [phone]   Trena Platt 03/28/2020, 12:54 PM

## 2020-03-29 DIAGNOSIS — K922 Gastrointestinal hemorrhage, unspecified: Secondary | ICD-10-CM | POA: Diagnosis not present

## 2020-03-29 MED ORDER — LOPERAMIDE HCL 2 MG PO CAPS
4.0000 mg | ORAL_CAPSULE | Freq: Four times a day (QID) | ORAL | Status: DC | PRN
  Administered 2020-03-29: 4 mg via ORAL
  Filled 2020-03-29: qty 2

## 2020-03-29 NOTE — Progress Notes (Addendum)
Subjective: Feeling better, but she still has diarrhea and abdominal pain.  Objective: Vital signs in last 24 hours: Temp:  [99.1 F (37.3 C)-99.8 F (37.7 C)] 99.1 F (37.3 C) (09/24 0451) Pulse Rate:  [77-82] 82 (09/24 0451) Resp:  [17-18] 17 (09/24 0451) BP: (139-146)/(66-79) 139/79 (09/24 0451) SpO2:  [95 %-97 %] 95 % (09/24 0451) Last BM Date: 03/28/20  Intake/Output from previous day: 09/23 0701 - 09/24 0700 In: -  Out: 650 [Urine:500; Stool:150] Intake/Output this shift: No intake/output data recorded.  General appearance: alert and no distress GI: tender with mild palpation  Lab Results: Recent Labs    03/27/20 0812 03/28/20 0606  WBC 4.5 3.7*  HGB 8.2* 8.8*  HCT 25.9* 27.9*  PLT 105* 103*   BMET Recent Labs    03/27/20 0812 03/28/20 0606  NA 139 140  K 3.6 3.9  CL 110 108  CO2 21* 22  GLUCOSE 82 87  BUN 13 9  CREATININE 1.18* 1.10*  CALCIUM 7.9* 8.2*   LFT Recent Labs    03/28/20 0606  PROT 5.2*  ALBUMIN 2.7*  AST 29  ALT 20  ALKPHOS 54  BILITOT 0.8   PT/INR No results for input(s): LABPROT, INR in the last 72 hours. Hepatitis Panel No results for input(s): HEPBSAG, HCVAB, HEPAIGM, HEPBIGM in the last 72 hours. C-Diff No results for input(s): CDIFFTOX in the last 72 hours. Fecal Lactopherrin No results for input(s): FECLLACTOFRN in the last 72 hours.  Studies/Results: No results found.  Medications:  Scheduled: . estradiol  1 mg Oral Daily  . folic acid  2 mg Oral Daily  . gabapentin  300 mg Oral Daily   And  . gabapentin  600 mg Oral QHS  . pantoprazole  40 mg Oral Daily  . saccharomyces boulardii  250 mg Oral BID  . sodium chloride flush  3 mL Intravenous Q12H  . vitamin B-12  1,000 mcg Oral Daily   Continuous: . sodium chloride 75 mL/hr at 03/27/20 0613  . ciprofloxacin 400 mg (03/28/20 2134)    Assessment/Plan: 1) Enteropathogenic E coli. 2) Diarrhea. 3) Abdominal pain.   She is improving, but very slowly.   Again, this current admission mirrors her June hospital course.  It took 7 days before she was able to be discharged home.  The rectal tube still showed liquid stool.  The plan was to discontinue the tube today, but the plan will change to have her keep it in for another day.  Plan: 1) Maintain rectal tube. 2) Continue with supportive care. 3) ? Discontinue her cipro.  Typically a 3-4 day treatment is adequate, but she still has  liquid stools.  ADDENDUM:  THE PATIENT DOES NOT HAVE ENTEROPATHOGENIC E COLI.  I picked up on the positive result and it was from her June admission.  Her GI pathogen panel is negative.  LOS: 4 days   Tahesha Skeet D 03/29/2020, 8:32 AM

## 2020-03-29 NOTE — Progress Notes (Signed)
Physical Therapy Treatment Patient Details Name: Helen Hardy MRN: [id number] DOB: 01/13/1951 Today's Date: 03/29/2020    History of Present Illness 69 y.o. female with medical history significant of non-Hodgkin's lymphoma (dx in 2004) in remission, rheumatoid arthritis since age 89, chronic pain, hypertension, GERD, osteoarthritis, lumbar radiculopathy, morbid obesity, anemia of chronic disease and admitted for abdominal pain with some rectal bleeding with bowel movements    PT Comments    Pt assisted to [facility] first and then ambulated short distance into hallway.      Follow Up Recommendations  Home health PT     Equipment Recommendations  None recommended by PT    Recommendations for Other Services       Precautions / Restrictions Precautions Precautions: Fall Precaution Comments: rectal pouch    Mobility  Bed Mobility Overal bed mobility: Needs Assistance Bed Mobility: Supine to Sit;Sit to Supine     Supine to sit: Min assist Sit to supine: Mod assist   General bed mobility comments: assist for trunk upright and LEs onto bed  Transfers Overall transfer level: Needs assistance Equipment used: Rolling walker (2 wheeled) Transfers: Sit to/from [facility] Sit to Stand: Min guard Stand pivot transfers: Min guard       General transfer comment: min/guard for safety, increased time and effort, cues for hand placement  Ambulation/Gait Ambulation/Gait assistance: Min guard Gait Distance (Feet): 15 Feet Assistive device: Rolling walker (2 wheeled) Gait Pattern/deviations: Step-through pattern;Decreased stride length Gait velocity: decr   General Gait Details: pt encouraged to ambulate and more agreeable with recliner following; pt self limiting on distance however   Stairs             Wheelchair Mobility    Modified Rankin (Stroke Patients Only)       Balance                                             Cognition Arousal/Alertness: Awake/alert Behavior During Therapy: WFL for tasks assessed/performed Overall Cognitive Status: Within Functional Limits for tasks assessed                                        Exercises      General Comments        Pertinent Vitals/Pain Pain Assessment: Faces Faces Pain Scale: Hurts little more Pain Location: abdomen Pain Descriptors / Indicators: Sore;Aching Pain Intervention(s): Repositioned;Monitored during session    Home Living                      Prior Function            PT Goals (current goals can now be found in the care plan section) Progress towards PT goals: Progressing toward goals    Frequency    Min 3X/week      PT Plan Current plan remains appropriate    Co-evaluation              AM-PAC PT "6 Clicks" Mobility   Outcome Measure  Help needed turning from your back to your side while in a flat bed without using bedrails?: A Little Help needed moving from lying on your back to sitting on the side of a flat bed without using bedrails?: A Little Help needed moving  to and from a bed to a chair (including a wheelchair)?: A Little Help needed standing up from a chair using your arms (e.g., wheelchair or bedside chair)?: A Little Help needed to walk in hospital room?: A Little Help needed climbing 3-5 steps with a railing? : A Lot 6 Click Score: 17    End of Session Equipment Utilized During Treatment: Gait belt Activity Tolerance: Patient limited by fatigue Patient left: in bed;with call bell/phone within reach;with bed alarm set;with family/visitor present   PT Visit Diagnosis: Difficulty in walking, not elsewhere classified (R26.2);Muscle weakness (generalized) (M62.81)     Time: 0012-3935 PT Time Calculation (min) (ACUTE ONLY): 22 min  Charges:  $Gait Training: 8-22 mins                    Arlyce Dice, DPT Acute Rehabilitation Services Pager: [phone] Office:  [phone]  Trena Platt 03/29/2020, 4:26 PM

## 2020-03-29 NOTE — Progress Notes (Addendum)
PROGRESS NOTE    Helen Hardy  RSW:[id number] DOB: June 27, 1951 DOA: 03/25/2020 PCP: Deland Pretty, MD    Brief Narrative: 69 year old female with past medical history of diverticulitis, rheumatoid arthritis, recent enteropathogenic E. coli GI infection in June, hypertension, chronic radicular lumbar pain, morbid obesity, non-Hodgkin's lymphoma (now in remission) who was brought in from home by EMS with complaints of constant moderate lower abdominal pain since yesterday with bright red blood per rectum and blood when wiping after a bowel movement.  ED Course: T103F, HR 116, RR 29, normotensive on room air.  Notable labs: BUN 29, creatinine 1.37 (baseline 0.8), calcium 8.9 (ionized calcium 0.88 but normal albumin-lab repeated and pending), COVID-19 negative.  CT abdomen pelvis with contrast most notable for: Segmental wall thickening and fat stranding of the transverse colon as well as base of the cecum consistent with infectious VS inflammatory VS ischemic colitis however favoring Crohn's disease.  Dr. Collene Mares, GI, consulted in the ED and believes this to be Crohn's disease.  She was started on Rocephin, Flagyl and Cipro and GI is to consult.  Assessment & Plan:   Principal Problem:   GI bleed Active Problems:   Rheumatoid arthritis ([place])   Essential hypertension   AKI (acute kidney injury) ([place])   SIRS (systemic inflammatory response syndrome) ([facility])   #1 acute diverticulitis versus infectious colitis-patient presented with high fever abdominal pain bloody diarrhea.  At the time of admission she met sepsis criteria with fever of 103, tachypnea  tachycardia hypotension with no leukocytosis. She has been treated with IV fluids Cipro and Flagyl.   Blood cultures so far no growth. Advance diet to full liquids. Patient still complains of abdominal pain but no nausea vomiting or further bloody bowel movements reported.  CT abdomen and pelvis 03/25/2020 with segmental wall thickening and fat  stranding of the transverse colon as well as the base of the cecum consistent with infectious versus inflammatory versus ischemic colitis however favoring Crohn's disease. Appreciate GI input.  Stool studies positive for  Lactoferrin Antibiotics stopped.  #2 history of rheumatoid arthritis holding steroids in light of sepsis  #3 hypotension resolved blood pressure improved 139/79.     Estimated body mass index is 45.6 kg/m as calculated from the following:   Height as of this encounter: 5' (1.524 m).   Weight as of this encounter: 105.9 kg.  DVT prophylaxis: SCD due to bleeding per rectum  code Status: Full code Family Communication: None at bedside  disposition Plan:  Status is: Inpatient  Dispo: The patient is from:home              Anticipated d/c is to: Home              Anticipated d/c date is: More than 2 days              Patient currently is not medically stable to d/c.  Patient admitted with abdominal pain fever and bloody bowel movements on IV fluids and IV antibiotics still on clear liquids    Consultants:   GI  Procedures: None Antimicrobials- Anti-infectives (From admission, onward)   Start     Dose/Rate Route Frequency Ordered Stop   03/25/20 2200  ciprofloxacin (CIPRO) IVPB 400 mg        400 mg 200 mL/hr over 60 Minutes Intravenous Every 12 hours 03/25/20 1142     03/25/20 1600  metroNIDAZOLE (FLAGYL) IVPB 500 mg  Status:  Discontinued        500 mg  100 mL/hr over 60 Minutes Intravenous Every 8 hours 03/25/20 1125 03/28/20 1509   03/25/20 1000  ciprofloxacin (CIPRO) IVPB 400 mg        400 mg 200 mL/hr over 60 Minutes Intravenous  Once 03/25/20 0952 03/25/20 1122   03/25/20 0715  cefTRIAXone (ROCEPHIN) 2 g in sodium chloride 0.9 % 100 mL IVPB       "And" Linked Group Details   2 g 200 mL/hr over 30 Minutes Intravenous  Once 03/25/20 0708 03/25/20 0800   03/25/20 0715  metroNIDAZOLE (FLAGYL) IVPB 500 mg       "And" Linked Group Details   500 mg 100 mL/hr  over 60 Minutes Intravenous  Once 03/25/20 0708 03/25/20 0936      Subjective: Patient is awake and alert still continues to have loose stools  Objective: Vitals:   03/28/20 0541 03/28/20 1446 03/28/20 2213 03/29/20 0451  BP: (!) 146/85 (!) 146/66 (!) 146/73 139/79  Pulse: 82 77 81 82  Resp: _0 Temp: 99.2 F (37.3 C) 99.8 F (37.7 C) 99.8 F (37.7 C) 99.1 F (37.3 C)  TempSrc: Oral Oral Oral Oral  SpO2: 94% 97% 97% 95%  Weight:      Height:       No intake or output data in the 24 hours ending 03/29/20 1228 Filed Weights   03/25/20 1325  Weight: 105.9 kg    Examination:  General exam: Appears calm and comfortable  Respiratory system: Clear to auscultation. Respiratory effort normal. Cardiovascular system: S1 & S2 heard, RRR. No JVD, murmurs, rubs, gallops or clicks. No pedal edema. Gastrointestinal system: Abdomen is nondistended, soft and tender. No organomegaly or masses felt. Normal bowel sounds heard. Central nervous system: Alert and oriented. No focal neurological deficits. Extremities: Symmetric 5 x 5 power. Skin: No rashes, lesions or ulcers Psychiatry: Judgement and insight appear normal. Mood & affect appropriate.     Data Reviewed: I have personally reviewed following labs and imaging studies  CBC: Recent Labs  Lab 03/25/20 0621 03/25/20 0649 03/26/20 0723 03/27/20 0812 03/28/20 0606  WBC 5.8  --  7.5 4.5 3.7*  NEUTROABS 3.0  --   --   --   --   HGB 10.5* 10.2* 8.7* 8.2* 8.8*  HCT 32.9* 30.0* 27.3* 25.9* 27.9*  MCV 116.7*  --  115.7* 116.1* 115.3*  PLT 132*  --  115* 105* 478*   Basic Metabolic Panel: Recent Labs  Lab 03/25/20 0621 03/25/20 0649 03/25/20 0701 03/26/20 0723 03/27/20 0812 03/28/20 0606  NA 137 140  --  138 139 140  K 4.2 4.2  --  4.1 3.6 3.9  CL 103 109  --  108 110 108  CO2 23  --   --  22 21* 22  GLUCOSE 115* 112*  --  79 82 87  BUN 29* 28*  --  _1 CREATININE 1.37* 1.40*  --  1.17* 1.18* 1.10*    CALCIUM 8.9  --   --  7.8* 7.9* 8.2*  MG  --   --  2.2  --   --   --   PHOS  --   --  2.2*  --   --   --    GFR: Estimated Creatinine Clearance: 52.4 mL/min (A) (by C-G formula based on SCr of 1.1 mg/dL (H)). Liver Function Tests: Recent Labs  Lab 03/25/20 0621 03/27/20 0812 03/28/20 0606  AST _2 ALT 25 20 20  ALKPHOS 56 51 54  BILITOT 0.5 0.8 0.8  PROT 6.5 4.9* 5.2*  ALBUMIN 3.7 2.5* 2.7*   No results for input(s): LIPASE, AMYLASE in the last 168 hours. No results for input(s): AMMONIA in the last 168 hours. Coagulation Profile: No results for input(s): INR, PROTIME in the last 168 hours. Cardiac Enzymes: No results for input(s): CKTOTAL, CKMB, CKMBINDEX, TROPONINI in the last 168 hours. BNP (last 3 results) No results for input(s): PROBNP in the last 8760 hours. HbA1C: No results for input(s): HGBA1C in the last 72 hours. CBG: No results for input(s): GLUCAP in the last 168 hours. Lipid Profile: No results for input(s): CHOL, HDL, LDLCALC, TRIG, CHOLHDL, LDLDIRECT in the last 72 hours. Thyroid Function Tests: No results for input(s): TSH, T4TOTAL, FREET4, T3FREE, THYROIDAB in the last 72 hours. Anemia Panel: Recent Labs    03/27/20 0812  VITAMINB12 1,208*  FERRITIN 1,684*  TIBC 166*  IRON 15*   Sepsis Labs: Recent Labs  Lab 03/25/20 0620  LATICACIDVEN 1.6    Recent Results (from the past 240 hour(s))  Culture, blood (routine x 2)     Status: None (Preliminary result)   Collection Time: 03/25/20  6:10 AM   Specimen: BLOOD RIGHT HAND  Result Value Ref Range Status   Specimen Description   Final    BLOOD RIGHT HAND Performed at [facility], [place] [address] [place], [place] [address]    Special Requests   Final    BOTTLES DRAWN AEROBIC AND ANAEROBIC Blood Culture adequate volume Performed at [place] [address] [place], [place] [address]    Culture   Final    NO GROWTH 3 DAYS Performed at [facility], [place] [address], [place], [place] [address]    Report Status PENDING  Incomplete  Culture, blood (routine x 2)     Status: None (Preliminary result)   Collection Time: 03/25/20  6:20 AM   Specimen: BLOOD  Result Value Ref Range Status   Specimen Description   Final    BLOOD RIGHT ANTECUBITAL Performed at [place] [address] [place], [place] [address]    Special Requests   Final    BOTTLES DRAWN AEROBIC AND ANAEROBIC Blood Culture adequate volume Performed at [place] [address], [place], [place] [address]    Culture   Final    NO GROWTH 3 DAYS Performed at [facility], [place] [address], [place], [place] [address]    Report Status PENDING  Incomplete  SARS Coronavirus 2 by RT PCR (hospital order, performed in [facility] hospital lab) Nasopharyngeal Nasopharyngeal Swab     Status: None   Collection Time: 03/25/20  6:22 AM   Specimen: Nasopharyngeal Swab  Result Value Ref Range Status   SARS Coronavirus 2 NEGATIVE NEGATIVE Final    Comment: (NOTE) SARS-CoV-2 target nucleic acids are NOT DETECTED.  The SARS-CoV-2 RNA is generally detectable in upper and lower respiratory specimens during the acute phase of infection. The lowest concentration of SARS-CoV-2 viral copies this assay can detect is 250 copies / mL. A negative result does not preclude SARS-CoV-2 infection and should not be used as the sole basis for treatment or other patient management decisions.  A negative result may occur with improper specimen collection / handling, submission of specimen other than nasopharyngeal swab, presence of viral mutation(s) within the areas targeted by this assay, and inadequate number of viral copies (<250 copies /  mL). A negative result must be combined with clinical observations, patient history, and epidemiological information.  Fact Sheet for Patients:   StrictlyIdeas.no  Fact Sheet  for Healthcare Providers: BankingDealers.co.za  This test is not yet approved or  cleared by the [place] FDA and has been authorized for detection and/or diagnosis of SARS-CoV-2 by FDA under an Emergency Use Authorization (EUA).  This EUA will remain in effect (meaning this test can be used) for the duration of the COVID-19 declaration under Section 564(b)(1) of the Act, 21 U.S.C. section 360bbb-3(b)(1), unless the authorization is terminated or revoked sooner.  Performed at [facility], [place] [address] [place], [place] [address]   Gastrointestinal Panel by PCR , Stool     Status: None   Collection Time: 03/28/20 10:20 AM   Specimen: Stool  Result Value Ref Range Status   Campylobacter species NOT DETECTED NOT DETECTED Final   Plesimonas shigelloides NOT DETECTED NOT DETECTED Final   Salmonella species NOT DETECTED NOT DETECTED Final   Yersinia enterocolitica NOT DETECTED NOT DETECTED Final   Vibrio species NOT DETECTED NOT DETECTED Final   Vibrio cholerae NOT DETECTED NOT DETECTED Final   Enteroaggregative E coli (EAEC) NOT DETECTED NOT DETECTED Final   Enteropathogenic E coli (EPEC) NOT DETECTED NOT DETECTED Final   Enterotoxigenic E coli (ETEC) NOT DETECTED NOT DETECTED Final   Shiga like toxin producing E coli (STEC) NOT DETECTED NOT DETECTED Final   Shigella/Enteroinvasive E coli (EIEC) NOT DETECTED NOT DETECTED Final   Cryptosporidium NOT DETECTED NOT DETECTED Final   Cyclospora cayetanensis NOT DETECTED NOT DETECTED Final   Entamoeba histolytica NOT DETECTED NOT DETECTED Final   Giardia lamblia NOT DETECTED NOT DETECTED Final   Adenovirus F40/41 NOT DETECTED NOT DETECTED Final   Astrovirus NOT DETECTED NOT DETECTED Final   Norovirus GI/GII NOT DETECTED NOT DETECTED Final   Rotavirus A NOT DETECTED NOT DETECTED Final   Sapovirus (I, II, IV, and V) NOT DETECTED NOT DETECTED Final    Comment: Performed at [facility],  [address] [place], [place] [address]         Radiology Studies: No results found.      Scheduled Meds: . estradiol  1 mg Oral Daily  . folic acid  2 mg Oral Daily  . gabapentin  300 mg Oral Daily   And  . gabapentin  600 mg Oral QHS  . pantoprazole  40 mg Oral Daily  . saccharomyces boulardii  250 mg Oral BID  . sodium chloride flush  3 mL Intravenous Q12H  . vitamin B-12  1,000 mcg Oral Daily   Continuous Infusions: . sodium chloride 75 mL/hr at 03/27/20 0613  . ciprofloxacin 400 mg (03/29/20 2003)     LOS: 4 days     Georgette Shell, MD  03/29/2020, 12:28 PM

## 2020-03-30 DIAGNOSIS — K922 Gastrointestinal hemorrhage, unspecified: Secondary | ICD-10-CM | POA: Diagnosis not present

## 2020-03-30 LAB — CULTURE, BLOOD (ROUTINE X 2)
Culture: NO GROWTH
Culture: NO GROWTH
Special Requests: ADEQUATE
Special Requests: ADEQUATE

## 2020-03-30 MED ORDER — IRBESARTAN 75 MG PO TABS
37.5000 mg | ORAL_TABLET | Freq: Every day | ORAL | Status: DC
  Administered 2020-03-30 – 2020-04-05 (×7): 37.5 mg via ORAL
  Filled 2020-03-30 (×7): qty 1

## 2020-03-30 MED ORDER — LOPERAMIDE HCL 2 MG PO CAPS
4.0000 mg | ORAL_CAPSULE | Freq: Three times a day (TID) | ORAL | Status: DC
  Administered 2020-03-30 – 2020-04-03 (×8): 4 mg via ORAL
  Filled 2020-03-30 (×11): qty 2

## 2020-03-30 MED ORDER — SODIUM CHLORIDE 0.9 % IV SOLN: INTRAVENOUS | Status: DC

## 2020-03-30 MED ORDER — PEG 3350-KCL-NA BICARB-NACL 420 G PO SOLR
4000.0000 mL | Freq: Once | ORAL | Status: AC
  Administered 2020-03-30: 4000 mL via ORAL
  Filled 2020-03-30 (×2): qty 4000

## 2020-03-30 NOTE — Progress Notes (Signed)
Subjective: Feeling tired today.  Objective: Vital signs in last 24 hours: Temp:  [99.2 F (37.3 C)-100.3 F (37.9 C)] 100 F (37.8 C) (09/25 1309) Pulse Rate:  [84-89] 85 (09/25 1309) Resp:  [18-20] 18 (09/25 1309) BP: (152-159)/(80-87) 159/84 (09/25 1309) SpO2:  [97 %-98 %] 98 % (09/25 1309) Last BM Date: 03/30/20 (flexiseal)  Intake/Output from previous day: 09/24 0701 - 09/25 0700 In: 100 [IV Piggyback:100] Out: 1750 [Urine:1450; Stool:300] Intake/Output this shift: No intake/output data recorded.  General appearance: alert and no distress GI: soft, non-tender; bowel sounds normal; no masses,  no organomegaly  Lab Results: Recent Labs    03/28/20 0606  WBC 3.7*  HGB 8.8*  HCT 27.9*  PLT 103*   BMET Recent Labs    03/28/20 0606  NA 140  K 3.9  CL 108  CO2 22  GLUCOSE 87  BUN 9  CREATININE 1.10*  CALCIUM 8.2*   LFT Recent Labs    03/28/20 0606  PROT 5.2*  ALBUMIN 2.7*  AST 29  ALT 20  ALKPHOS 54  BILITOT 0.8   PT/INR No results for input(s): LABPROT, INR in the last 72 hours. Hepatitis Panel No results for input(s): HEPBSAG, HCVAB, HEPAIGM, HEPBIGM in the last 72 hours. C-Diff No results for input(s): CDIFFTOX in the last 72 hours. Fecal Lactopherrin No results for input(s): FECLLACTOFRN in the last 72 hours.  Studies/Results: No results found.  Medications:  Scheduled: . estradiol  1 mg Oral Daily  . folic acid  2 mg Oral Daily  . gabapentin  300 mg Oral Daily   And  . gabapentin  600 mg Oral QHS  . irbesartan  37.5 mg Oral Daily  . loperamide  4 mg Oral TID  . pantoprazole  40 mg Oral Daily  . polyethylene glycol-electrolytes  4,000 mL Oral Once  . saccharomyces boulardii  250 mg Oral BID  . sodium chloride flush  3 mL Intravenous Q12H  . vitamin B-12  1,000 mcg Oral Daily   Continuous: . sodium chloride 75 mL/hr at 03/29/20 1628    Assessment/Plan: 1) Diarrhea - mildly improving. 2) ABD pain - improved.   The patient's  stools are thickening with loperamide, but there is still liquid stool in the stool bag.  A colonoscopy will be performed for further evaluation tomorrow.  Her abdominal pain is much better.  It is not tender to palpation.  Plan: 1) Colonoscopy tomorrow.  LOS: 5 days   Bert Ptacek D 03/30/2020, 1:21 PM

## 2020-03-30 NOTE — Progress Notes (Signed)
PROGRESS NOTE    Helen Hardy  OMV:[id number] DOB: 04-Mar-1951 DOA: 03/25/2020 PCP: Deland Pretty, MD    Brief Narrative: 69 year old female with past medical history of diverticulitis, rheumatoid arthritis, recent enteropathogenic E. coli GI infection in June, hypertension, chronic radicular lumbar pain, morbid obesity, non-Hodgkin's lymphoma (now in remission) who was brought in from home by EMS with complaints of constant moderate lower abdominal pain since yesterday with bright red blood per rectum and blood when wiping after a bowel movement.  ED Course: T103F, HR 116, RR 29, normotensive on room air.  Notable labs: BUN 29, creatinine 1.37 (baseline 0.8), calcium 8.9 (ionized calcium 0.88 but normal albumin-lab repeated and pending), COVID-19 negative.  CT abdomen pelvis with contrast most notable for: Segmental wall thickening and fat stranding of the transverse colon as well as base of the cecum consistent with infectious VS inflammatory VS ischemic colitis however favoring Crohn's disease.  Dr. Collene Mares, GI, consulted in the ED and believes this to be Crohn's disease.  She was started on Rocephin, Flagyl and Cipro and GI is to consult.  Assessment & Plan:   Principal Problem:   GI bleed Active Problems:   Rheumatoid arthritis ([place])   Essential hypertension   AKI (acute kidney injury) ([place])   SIRS (systemic inflammatory response syndrome) ([facility])   #1 acute diverticulitis versus infectious colitis-patient presented with high fever abdominal pain bloody diarrhea.  At the time of admission she met sepsis criteria with fever of 103, tachypnea  tachycardia hypotension with no leukocytosis. She has been treated with IV fluids Cipro and Flagyl.   Blood cultures so far no growth. Advance diet to soft diet Patient continues with multiple loose BMs.still with rectal tube in place. Advance diet to soft diet  CT abdomen and pelvis 03/25/2020 with segmental wall thickening and fat stranding of  the transverse colon as well as the base of the cecum consistent with infectious versus inflammatory versus ischemic colitis however favoring Crohn's disease.  Stool studies positive for  Lactoferrin Antibiotics stopped 9/24  #2 history of rheumatoid arthritis holding steroids in light of sepsis  #3 hypotension- resolved bp 152/80  Restart avapro.   Estimated body mass index is 45.6 kg/m as calculated from the following:   Height as of this encounter: 5' (1.524 m).   Weight as of this encounter: 105.9 kg.  DVT prophylaxis: SCD due to bleeding per rectum  code Status: Full code Family Communication: None at bedside  disposition Plan:  Status is: Inpatient  Dispo: The patient is from:home              Anticipated d/c is to: Home              Anticipated d/c date is: More than 2 days              Patient currently is not medically stable to d/c.  Patient admitted with abdominal pain fever and bloody bowel movements on IV fluids,continues with rectal tube..   Consultants:   GI  Procedures: None Antimicrobials- Anti-infectives (From admission, onward)   Start     Dose/Rate Route Frequency Ordered Stop   03/25/20 2200  ciprofloxacin (CIPRO) IVPB 400 mg  Status:  Discontinued        400 mg 200 mL/hr over 60 Minutes Intravenous Every 12 hours 03/25/20 1142 03/29/20 1503   03/25/20 1600  metroNIDAZOLE (FLAGYL) IVPB 500 mg  Status:  Discontinued        500 mg 100 mL/hr over 60  Minutes Intravenous Every 8 hours 03/25/20 1125 03/28/20 1509   03/25/20 1000  ciprofloxacin (CIPRO) IVPB 400 mg        400 mg 200 mL/hr over 60 Minutes Intravenous  Once 03/25/20 0952 03/25/20 1122   03/25/20 0715  cefTRIAXone (ROCEPHIN) 2 g in sodium chloride 0.9 % 100 mL IVPB       "And" Linked Group Details   2 g 200 mL/hr over 30 Minutes Intravenous  Once 03/25/20 0708 03/25/20 0800   03/25/20 0715  metroNIDAZOLE (FLAGYL) IVPB 500 mg       "And" Linked Group Details   500 mg 100 mL/hr over 60 Minutes  Intravenous  Once 03/25/20 0708 03/25/20 0936      Subjective: Patient is awake and alert still continues to have loose stools  Objective: Vitals:   03/28/20 2213 03/29/20 0451 03/29/20 1345 03/30/20 0536  BP: (!) 146/73 139/79 (!) 153/87 (!) 152/80  Pulse: 81 82 84 89  Resp: 17 17 19 20   Temp: 99.8 F (37.7 C) 99.1 F (37.3 C) 99.2 F (37.3 C) 100.3 F (37.9 C)  TempSrc: Oral Oral Oral Oral  SpO2: 97% 95% 98% 97%  Weight:      Height:        Intake/Output Summary (Last 24 hours) at 03/30/2020 1102 Last data filed at 03/30/2020 0500 Gross per 24 hour  Intake --  Output 1750 ml  Net -1750 ml   Filed Weights   03/25/20 1325  Weight: 105.9 kg    Examination:  General exam: Appears calm and comfortable  Respiratory system: Clear to auscultation. Respiratory effort normal. Cardiovascular system: S1 & S2 heard, RRR. No JVD, murmurs, rubs, gallops or clicks. No pedal edema. Gastrointestinal system: Abdomen is nondistended, soft and tender. No organomegaly or masses felt. Normal bowel sounds heard. Central nervous system: Alert and oriented. No focal neurological deficits. Extremities: Symmetric 5 x 5 power. Skin: No rashes, lesions or ulcers Psychiatry: Judgement and insight appear normal. Mood & affect appropriate.     Data Reviewed: I have personally reviewed following labs and imaging studies  CBC: Recent Labs  Lab 03/25/20 0621 03/25/20 0649 03/26/20 0723 03/27/20 0812 03/28/20 0606  WBC 5.8  --  7.5 4.5 3.7*  NEUTROABS 3.0  --   --   --   --   HGB 10.5* 10.2* 8.7* 8.2* 8.8*  HCT 32.9* 30.0* 27.3* 25.9* 27.9*  MCV 116.7*  --  115.7* 116.1* 115.3*  PLT 132*  --  115* 105* 299*   Basic Metabolic Panel: Recent Labs  Lab 03/25/20 0621 03/25/20 0649 03/25/20 0701 03/26/20 0723 03/27/20 0812 03/28/20 0606  NA 137 140  --  138 139 140  K 4.2 4.2  --  4.1 3.6 3.9  CL 103 109  --  108 110 108  CO2 23  --   --  22 21* 22  GLUCOSE 115* 112*  --  79 82 87   BUN 29* 28*  --  20 13 9   CREATININE 1.37* 1.40*  --  1.17* 1.18* 1.10*  CALCIUM 8.9  --   --  7.8* 7.9* 8.2*  MG  --   --  2.2  --   --   --   PHOS  --   --  2.2*  --   --   --    GFR: Estimated Creatinine Clearance: 52.4 mL/min (A) (by C-G formula based on SCr of 1.1 mg/dL (H)). Liver Function Tests: Recent Labs  Lab 03/25/20 [phone]  03/27/20 0812 03/28/20 0606  AST 30 26 29   ALT 25 20 20   ALKPHOS 56 51 54  BILITOT 0.5 0.8 0.8  PROT 6.5 4.9* 5.2*  ALBUMIN 3.7 2.5* 2.7*   No results for input(s): LIPASE, AMYLASE in the last 168 hours. No results for input(s): AMMONIA in the last 168 hours. Coagulation Profile: No results for input(s): INR, PROTIME in the last 168 hours. Cardiac Enzymes: No results for input(s): CKTOTAL, CKMB, CKMBINDEX, TROPONINI in the last 168 hours. BNP (last 3 results) No results for input(s): PROBNP in the last 8760 hours. HbA1C: No results for input(s): HGBA1C in the last 72 hours. CBG: No results for input(s): GLUCAP in the last 168 hours. Lipid Profile: No results for input(s): CHOL, HDL, LDLCALC, TRIG, CHOLHDL, LDLDIRECT in the last 72 hours. Thyroid Function Tests: No results for input(s): TSH, T4TOTAL, FREET4, T3FREE, THYROIDAB in the last 72 hours. Anemia Panel: No results for input(s): VITAMINB12, FOLATE, FERRITIN, TIBC, IRON, RETICCTPCT in the last 72 hours. Sepsis Labs: Recent Labs  Lab 03/25/20 0620  LATICACIDVEN 1.6    Recent Results (from the past 240 hour(s))  Culture, blood (routine x 2)     Status: None (Preliminary result)   Collection Time: 03/25/20  6:10 AM   Specimen: BLOOD RIGHT HAND  Result Value Ref Range Status   Specimen Description   Final    BLOOD RIGHT HAND Performed at [facility], [place] [address], [place], [place] [address]    Special Requests   Final    BOTTLES DRAWN AEROBIC AND ANAEROBIC Blood Culture adequate volume Performed at [facility] [address],  [place], [place] [address]    Culture   Final    NO GROWTH 3 DAYS Performed at [facility], [place] [address], [place], [place] [address]    Report Status PENDING  Incomplete  Culture, blood (routine x 2)     Status: None (Preliminary result)   Collection Time: 03/25/20  6:20 AM   Specimen: BLOOD  Result Value Ref Range Status   Specimen Description   Final    BLOOD RIGHT ANTECUBITAL Performed at [facility] [address] [place], [place] [address]    Special Requests   Final    BOTTLES DRAWN AEROBIC AND ANAEROBIC Blood Culture adequate volume Performed at [facility] [address], [place], [place] [address]    Culture   Final    NO GROWTH 3 DAYS Performed at [facility], [place] [address], [place], [place] [address]    Report Status PENDING  Incomplete  SARS Coronavirus 2 by RT PCR (hospital order, performed in [facility] hospital lab) Nasopharyngeal Nasopharyngeal Swab     Status: None   Collection Time: 03/25/20  6:22 AM   Specimen: Nasopharyngeal Swab  Result Value Ref Range Status   SARS Coronavirus 2 NEGATIVE NEGATIVE Final    Comment: (NOTE) SARS-CoV-2 target nucleic acids are NOT DETECTED.  The SARS-CoV-2 RNA is generally detectable in upper and lower respiratory specimens during the acute phase of infection. The lowest concentration of SARS-CoV-2 viral copies this assay can detect is 250 copies / mL. A negative result does not preclude SARS-CoV-2 infection and should not be used as the sole basis for treatment or other patient management decisions.  A negative result may occur with improper specimen collection / handling, submission of specimen other than nasopharyngeal swab, presence of viral mutation(s) within the areas targeted by this assay,  and inadequate number of viral copies (<250 copies / mL). A negative result must be combined with clinical observations, patient history, and epidemiological  information.  Fact Sheet for Patients:   StrictlyIdeas.no  Fact Sheet for Healthcare Providers: BankingDealers.co.za  This test is not yet approved or  cleared by the [place] FDA and has been authorized for detection and/or diagnosis of SARS-CoV-2 by FDA under an Emergency Use Authorization (EUA).  This EUA will remain in effect (meaning this test can be used) for the duration of the COVID-19 declaration under Section 564(b)(1) of the Act, 21 U.S.C. section 360bbb-3(b)(1), unless the authorization is terminated or revoked sooner.  Performed at [facility], [place] [address], [place], [place] [address]   Gastrointestinal Panel by PCR , Stool     Status: None   Collection Time: 03/28/20 10:20 AM   Specimen: Stool  Result Value Ref Range Status   Campylobacter species NOT DETECTED NOT DETECTED Final   Plesimonas shigelloides NOT DETECTED NOT DETECTED Final   Salmonella species NOT DETECTED NOT DETECTED Final   Yersinia enterocolitica NOT DETECTED NOT DETECTED Final   Vibrio species NOT DETECTED NOT DETECTED Final   Vibrio cholerae NOT DETECTED NOT DETECTED Final   Enteroaggregative E coli (EAEC) NOT DETECTED NOT DETECTED Final   Enteropathogenic E coli (EPEC) NOT DETECTED NOT DETECTED Final   Enterotoxigenic E coli (ETEC) NOT DETECTED NOT DETECTED Final   Shiga like toxin producing E coli (STEC) NOT DETECTED NOT DETECTED Final   Shigella/Enteroinvasive E coli (EIEC) NOT DETECTED NOT DETECTED Final   Cryptosporidium NOT DETECTED NOT DETECTED Final   Cyclospora cayetanensis NOT DETECTED NOT DETECTED Final   Entamoeba histolytica NOT DETECTED NOT DETECTED Final   Giardia lamblia NOT DETECTED NOT DETECTED Final   Adenovirus F40/41 NOT DETECTED NOT DETECTED Final   Astrovirus NOT DETECTED NOT DETECTED Final   Norovirus GI/GII NOT DETECTED NOT DETECTED Final   Rotavirus A NOT DETECTED NOT DETECTED Final   Sapovirus  (I, II, IV, and V) NOT DETECTED NOT DETECTED Final    Comment: Performed at [facility], [address], [place], [place] [address]         Radiology Studies: No results found.      Scheduled Meds: . estradiol  1 mg Oral Daily  . folic acid  2 mg Oral Daily  . gabapentin  300 mg Oral Daily   And  . gabapentin  600 mg Oral QHS  . loperamide  4 mg Oral TID  . pantoprazole  40 mg Oral Daily  . saccharomyces boulardii  250 mg Oral BID  . sodium chloride flush  3 mL Intravenous Q12H  . vitamin B-12  1,000 mcg Oral Daily   Continuous Infusions: . sodium chloride 75 mL/hr at 03/29/20 1628     LOS: 5 days     Georgette Shell, MD  03/30/2020, 11:02 AM

## 2020-03-31 DIAGNOSIS — Z7952 Long term (current) use of systemic steroids: Secondary | ICD-10-CM | POA: Diagnosis not present

## 2020-03-31 DIAGNOSIS — Z6841 Body Mass Index (BMI) 40.0 and over, adult: Secondary | ICD-10-CM | POA: Diagnosis not present

## 2020-03-31 DIAGNOSIS — Z90710 Acquired absence of both cervix and uterus: Secondary | ICD-10-CM | POA: Diagnosis not present

## 2020-03-31 DIAGNOSIS — Z79891 Long term (current) use of opiate analgesic: Secondary | ICD-10-CM | POA: Diagnosis not present

## 2020-03-31 DIAGNOSIS — K5000 Crohn's disease of small intestine without complications: Secondary | ICD-10-CM | POA: Diagnosis not present

## 2020-03-31 DIAGNOSIS — M1990 Unspecified osteoarthritis, unspecified site: Secondary | ICD-10-CM | POA: Diagnosis not present

## 2020-03-31 DIAGNOSIS — Z9181 History of falling: Secondary | ICD-10-CM | POA: Diagnosis not present

## 2020-03-31 DIAGNOSIS — K5751 Diverticulosis of both small and large intestine without perforation or abscess with bleeding: Secondary | ICD-10-CM | POA: Diagnosis not present

## 2020-03-31 DIAGNOSIS — D631 Anemia in chronic kidney disease: Secondary | ICD-10-CM | POA: Diagnosis not present

## 2020-03-31 DIAGNOSIS — K219 Gastro-esophageal reflux disease without esophagitis: Secondary | ICD-10-CM | POA: Diagnosis not present

## 2020-03-31 DIAGNOSIS — M069 Rheumatoid arthritis, unspecified: Secondary | ICD-10-CM | POA: Diagnosis not present

## 2020-03-31 DIAGNOSIS — I129 Hypertensive chronic kidney disease with stage 1 through stage 4 chronic kidney disease, or unspecified chronic kidney disease: Secondary | ICD-10-CM | POA: Diagnosis not present

## 2020-03-31 DIAGNOSIS — N183 Chronic kidney disease, stage 3 unspecified: Secondary | ICD-10-CM | POA: Diagnosis not present

## 2020-03-31 DIAGNOSIS — G8929 Other chronic pain: Secondary | ICD-10-CM | POA: Diagnosis not present

## 2020-03-31 DIAGNOSIS — M5416 Radiculopathy, lumbar region: Secondary | ICD-10-CM | POA: Diagnosis not present

## 2020-03-31 DIAGNOSIS — K922 Gastrointestinal hemorrhage, unspecified: Secondary | ICD-10-CM | POA: Diagnosis not present

## 2020-03-31 DIAGNOSIS — R739 Hyperglycemia, unspecified: Secondary | ICD-10-CM | POA: Diagnosis not present

## 2020-03-31 LAB — CBC
HCT: 25.9 % — ABNORMAL LOW (ref 36.0–46.0)
Hemoglobin: 8.4 g/dL — ABNORMAL LOW (ref 12.0–15.0)
MCH: 35.9 pg — ABNORMAL HIGH (ref 26.0–34.0)
MCHC: 32.4 g/dL (ref 30.0–36.0)
MCV: 110.7 fL — ABNORMAL HIGH (ref 80.0–100.0)
Platelets: 73 10*3/uL — ABNORMAL LOW (ref 150–400)
RBC: 2.34 MIL/uL — ABNORMAL LOW (ref 3.87–5.11)
RDW: 16.1 % — ABNORMAL HIGH (ref 11.5–15.5)
WBC: 4.3 10*3/uL (ref 4.0–10.5)
nRBC: 0 % (ref 0.0–0.2)

## 2020-03-31 LAB — COMPREHENSIVE METABOLIC PANEL WITH GFR
ALT: 46 U/L — ABNORMAL HIGH (ref 0–44)
AST: 83 U/L — ABNORMAL HIGH (ref 15–41)
Albumin: 2.3 g/dL — ABNORMAL LOW (ref 3.5–5.0)
Alkaline Phosphatase: 65 U/L (ref 38–126)
Anion gap: 11 (ref 5–15)
BUN: <5 mg/dL — ABNORMAL LOW (ref 8–23)
CO2: 17 mmol/L — ABNORMAL LOW (ref 22–32)
Calcium: 7.7 mg/dL — ABNORMAL LOW (ref 8.9–10.3)
Chloride: 109 mmol/L (ref 98–111)
Creatinine, Ser: 0.87 mg/dL (ref 0.44–1.00)
GFR calc Af Amer: >60 mL/min
GFR calc non Af Amer: >60 mL/min
Glucose, Bld: 65 mg/dL — ABNORMAL LOW (ref 70–99)
Potassium: 3.4 mmol/L — ABNORMAL LOW (ref 3.5–5.1)
Sodium: 137 mmol/L (ref 135–145)
Total Bilirubin: 0.8 mg/dL (ref 0.3–1.2)
Total Protein: 4.9 g/dL — ABNORMAL LOW (ref 6.5–8.1)

## 2020-03-31 MED ORDER — PROPOFOL 500 MG/50ML IV EMUL
INTRAVENOUS | Status: AC
  Filled 2020-03-31: qty 50

## 2020-03-31 NOTE — Anesthesia Preprocedure Evaluation (Addendum)
Anesthesia Evaluation  Patient identified by MRN, date of birth, ID band Patient awake    Reviewed: Allergy & Precautions, NPO status , Patient's Chart, lab work & pertinent test results  Airway Mallampati: III  TM Distance: <3 FB Neck ROM: Full    Dental  (+) Missing, Partial Upper, Dental Advisory Given   Pulmonary neg pulmonary ROS,    breath sounds clear to auscultation + decreased breath sounds      Cardiovascular hypertension, Pt. on medications  Rhythm:Regular Rate:Normal     Neuro/Psych negative neurological ROS  negative psych ROS   GI/Hepatic Neg liver ROS, GERD  ,  Endo/Other  Morbid obesity  Renal/GU negative Renal ROS  negative genitourinary   Musculoskeletal  (+) Arthritis , Rheumatoid disorders and steroids,    Abdominal (+) + obese,   Peds negative pediatric ROS (+)  Hematology  (+) anemia ,   Anesthesia Other Findings   Reproductive/Obstetrics negative OB ROS                            Anesthesia Physical Anesthesia Plan  ASA: III  Anesthesia Plan: MAC   Post-op Pain Management:    Induction: Intravenous  PONV Risk Score and Plan: 0  Airway Management Planned: Simple Face Mask and Natural Airway  Additional Equipment: None  Intra-op Plan:   Post-operative Plan:   Informed Consent: I have reviewed the patients History and Physical, chart, labs and discussed the procedure including the risks, benefits and alternatives for the proposed anesthesia with the patient or authorized representative who has indicated his/her understanding and acceptance.     Dental advisory given  Plan Discussed with: CRNA  Anesthesia Plan Comments:        Anesthesia Quick Evaluation

## 2020-03-31 NOTE — Progress Notes (Signed)
Subjective: She was unable to drink more than half the prep.  Objective: Vital signs in last 24 hours: Temp:  [99.6 F (37.6 C)-100.3 F (37.9 C)] 100.3 F (37.9 C) (09/26 1415) Pulse Rate:  [88-99] 88 (09/26 1415) Resp:  [16-18] 17 (09/26 1415) BP: (139-149)/(65-78) 149/78 (09/26 1415) SpO2:  [96 %-99 %] 99 % (09/26 1415) Last BM Date: 03/31/20  Intake/Output from previous day: 09/25 0701 - 09/26 0700 In: 2318 [P.O.:740; I.V.:1578] Out: 1000 [Urine:1000] Intake/Output this shift: Total I/O In: 250 [P.O.:250] Out: 800 [Urine:300; Stool:500]  General appearance: alert and no distress GI: soft, non-tender; bowel sounds normal; no masses,  no organomegaly  Lab Results: Recent Labs    03/31/20 1120  WBC 4.3  HGB 8.4*  HCT 25.9*  PLT 73*   BMET Recent Labs    03/31/20 1120  NA 137  K 3.4*  CL 109  CO2 17*  GLUCOSE 65*  BUN <5*  CREATININE 0.87  CALCIUM 7.7*   LFT Recent Labs    03/31/20 1120  PROT 4.9*  ALBUMIN 2.3*  AST 83*  ALT 46*  ALKPHOS 65  BILITOT 0.8   PT/INR No results for input(s): LABPROT, INR in the last 72 hours. Hepatitis Panel No results for input(s): HEPBSAG, HCVAB, HEPAIGM, HEPBIGM in the last 72 hours. C-Diff No results for input(s): CDIFFTOX in the last 72 hours. Fecal Lactopherrin No results for input(s): FECLLACTOFRN in the last 72 hours.  Studies/Results: No results found.  Medications:  Scheduled: . estradiol  1 mg Oral Daily  . folic acid  2 mg Oral Daily  . gabapentin  300 mg Oral Daily   And  . gabapentin  600 mg Oral QHS  . irbesartan  37.5 mg Oral Daily  . loperamide  4 mg Oral TID  . pantoprazole  40 mg Oral Daily  . saccharomyces boulardii  250 mg Oral BID  . sodium chloride flush  3 mL Intravenous Q12H  . vitamin B-12  1,000 mcg Oral Daily   Continuous: . sodium chloride 75 mL/hr at 03/30/20 1930  . sodium chloride      Assessment/Plan: 1) Diarrhea - ? Etiology. 2) Abdominal pain - minimal.   She  was not able to prep properly for the colonoscopy.  Another attempt at the procedure will be performed tomorrow.  Plan: 1) Colonoscopy with Dr. Collene Mares.  LOS: 6 days   Helen Hardy D 03/31/2020, 3:03 PM

## 2020-03-31 NOTE — Progress Notes (Signed)
PROGRESS NOTE    Helen Hardy  QVZ:[id number] DOB: March 05, 1951 DOA: 03/25/2020 PCP: Deland Pretty, MD    Brief Narrative: 69 year old female with past medical history of diverticulitis, rheumatoid arthritis, recent enteropathogenic E. coli GI infection in June, hypertension, chronic radicular lumbar pain, morbid obesity, non-Hodgkin's lymphoma (now in remission) who was brought in from home by EMS with complaints of constant moderate lower abdominal pain since yesterday with bright red blood per rectum and blood when wiping after a bowel movement.  ED Course: T103F, HR 116, RR 29, normotensive on room air.  Notable labs: BUN 29, creatinine 1.37 (baseline 0.8), calcium 8.9 (ionized calcium 0.88 but normal albumin-lab repeated and pending), COVID-19 negative.  CT abdomen pelvis with contrast most notable for: Segmental wall thickening and fat stranding of the transverse colon as well as base of the cecum consistent with infectious VS inflammatory VS ischemic colitis however favoring Crohn's disease.  Dr. Collene Mares, GI, consulted in the ED and believes this to be Crohn's disease.  She was started on Rocephin, Flagyl and Cipro and GI is to consult.  Assessment & Plan:   Principal Problem:   GI bleed Active Problems:   Rheumatoid arthritis ([place])   Essential hypertension   AKI (acute kidney injury) ([place])   SIRS (systemic inflammatory response syndrome) ([facility])   #1 acute diverticulitis versus infectious colitis-patient presented with high fever abdominal pain bloody diarrhea.  At the time of admission she met sepsis criteria with fever of 103, tachypnea  tachycardia hypotension with no leukocytosis. She has been treated with IV fluids Cipro and Flagyl. Antibiotics were stopped after 4 days since stool culture did not grow anything. However her lactoferrin was positive which is likely secondary to inflammation. Patient has been n.p.o. for colonoscopy today. Will advance diet after she returns from  colonoscopy.  Consult PT Labs today  CT abdomen and pelvis 03/25/2020 with segmental wall thickening and fat stranding of the transverse colon as well as the base of the cecum consistent with infectious versus inflammatory versus ischemic colitis however favoring Crohn's disease.  Stool studies positive for  Lactoferrin Antibiotics stopped 9/24  #2 history of rheumatoid arthritis prednisone on hold  #3 hypotension- resolved BP 139/76 continue Avapro   Estimated body mass index is 45.6 kg/m as calculated from the following:   Height as of this encounter: 5' (1.524 m).   Weight as of this encounter: 105.9 kg.  DVT prophylaxis: SCD due to bleeding per rectum  code Status: Full code Family Communication: None at bedside  disposition Plan:  Status is: Inpatient  Dispo: The patient is from:home              Anticipated d/c is to: Home              Anticipated d/c date is: More than 2 days              Patient currently is not medically stable to d/c.  Patient admitted with abdominal pain fever and bloody bowel movements on IV fluids,continues with rectal tube..   Consultants:   GI  Procedures: None Antimicrobials- Anti-infectives (From admission, onward)   Start     Dose/Rate Route Frequency Ordered Stop   03/25/20 2200  ciprofloxacin (CIPRO) IVPB 400 mg  Status:  Discontinued        400 mg 200 mL/hr over 60 Minutes Intravenous Every 12 hours 03/25/20 1142 03/29/20 1503   03/25/20 1600  metroNIDAZOLE (FLAGYL) IVPB 500 mg  Status:  Discontinued  500 mg 100 mL/hr over 60 Minutes Intravenous Every 8 hours 03/25/20 1125 03/28/20 1509   03/25/20 1000  ciprofloxacin (CIPRO) IVPB 400 mg        400 mg 200 mL/hr over 60 Minutes Intravenous  Once 03/25/20 0952 03/25/20 1122   03/25/20 0715  cefTRIAXone (ROCEPHIN) 2 g in sodium chloride 0.9 % 100 mL IVPB       "And" Linked Group Details   2 g 200 mL/hr over 30 Minutes Intravenous  Once 03/25/20 0708 03/25/20 0800   03/25/20 0715   metroNIDAZOLE (FLAGYL) IVPB 500 mg       "And" Linked Group Details   500 mg 100 mL/hr over 60 Minutes Intravenous  Once 03/25/20 0708 03/25/20 0936      Subjective: Patient is awake and alert still continues to have loose stools  Objective: Vitals:   03/30/20 0536 03/30/20 1309 03/30/20 1949 03/31/20 0522  BP: (!) 152/80 (!) 159/84 (!) 147/65 139/76  Pulse: 89 85 99 89  Resp: 20 18 16 18   Temp: 100.3 F (37.9 C) 100 F (37.8 C) 99.6 F (37.6 C) 99.6 F (37.6 C)  TempSrc: Oral Oral Oral Oral  SpO2: 97% 98% 98% 96%  Weight:      Height:        Intake/Output Summary (Last 24 hours) at 03/31/2020 1031 Last data filed at 03/31/2020 0600 Gross per 24 hour  Intake 2078 ml  Output 1000 ml  Net 1078 ml   Filed Weights   03/25/20 1325  Weight: 105.9 kg    Examination:RECTAL TUBE IN PLACE loose bms has been drinking golytely  General exam: Appears calm and comfortable  Respiratory system: Clear to auscultation. Respiratory effort normal. Cardiovascular system: S1 & S2 heard, RRR. No JVD, murmurs, rubs, gallops or clicks. No pedal edema. Gastrointestinal system: Abdomen is nondistended, soft and tender. No organomegaly or masses felt. Normal bowel sounds heard. Central nervous system: Alert and oriented. No focal neurological deficits. Extremities: trace edema Skin: No rashes, lesions or ulcers Psychiatry: Judgement and insight appear normal. Mood & affect appropriate.     Data Reviewed: I have personally reviewed following labs and imaging studies  CBC: Recent Labs  Lab 03/25/20 0621 03/25/20 0649 03/26/20 0723 03/27/20 0812 03/28/20 0606  WBC 5.8  --  7.5 4.5 3.7*  NEUTROABS 3.0  --   --   --   --   HGB 10.5* 10.2* 8.7* 8.2* 8.8*  HCT 32.9* 30.0* 27.3* 25.9* 27.9*  MCV 116.7*  --  115.7* 116.1* 115.3*  PLT 132*  --  115* 105* 811*   Basic Metabolic Panel: Recent Labs  Lab 03/25/20 0621 03/25/20 0649 03/25/20 0701 03/26/20 0723 03/27/20 0812  03/28/20 0606  NA 137 140  --  138 139 140  K 4.2 4.2  --  4.1 3.6 3.9  CL 103 109  --  108 110 108  CO2 23  --   --  22 21* 22  GLUCOSE 115* 112*  --  79 82 87  BUN 29* 28*  --  20 13 9   CREATININE 1.37* 1.40*  --  1.17* 1.18* 1.10*  CALCIUM 8.9  --   --  7.8* 7.9* 8.2*  MG  --   --  2.2  --   --   --   PHOS  --   --  2.2*  --   --   --    GFR: Estimated Creatinine Clearance: 52.4 mL/min (A) (by C-G formula based on SCr  of 1.1 mg/dL (H)). Liver Function Tests: Recent Labs  Lab 03/25/20 0621 03/27/20 0812 03/28/20 0606  AST 30 26 29   ALT 25 20 20   ALKPHOS 56 51 54  BILITOT 0.5 0.8 0.8  PROT 6.5 4.9* 5.2*  ALBUMIN 3.7 2.5* 2.7*   No results for input(s): LIPASE, AMYLASE in the last 168 hours. No results for input(s): AMMONIA in the last 168 hours. Coagulation Profile: No results for input(s): INR, PROTIME in the last 168 hours. Cardiac Enzymes: No results for input(s): CKTOTAL, CKMB, CKMBINDEX, TROPONINI in the last 168 hours. BNP (last 3 results) No results for input(s): PROBNP in the last 8760 hours. HbA1C: No results for input(s): HGBA1C in the last 72 hours. CBG: No results for input(s): GLUCAP in the last 168 hours. Lipid Profile: No results for input(s): CHOL, HDL, LDLCALC, TRIG, CHOLHDL, LDLDIRECT in the last 72 hours. Thyroid Function Tests: No results for input(s): TSH, T4TOTAL, FREET4, T3FREE, THYROIDAB in the last 72 hours. Anemia Panel: No results for input(s): VITAMINB12, FOLATE, FERRITIN, TIBC, IRON, RETICCTPCT in the last 72 hours. Sepsis Labs: Recent Labs  Lab 03/25/20 0620  LATICACIDVEN 1.6    Recent Results (from the past 240 hour(s))  Culture, blood (routine x 2)     Status: None   Collection Time: 03/25/20  6:10 AM   Specimen: BLOOD RIGHT HAND  Result Value Ref Range Status   Specimen Description   Final    BLOOD RIGHT HAND Performed at [facility] [address], [place], [place] [address]    Special Requests    Final    BOTTLES DRAWN AEROBIC AND ANAEROBIC Blood Culture adequate volume Performed at [facility] [address], [place], [place] [address]    Culture   Final    NO GROWTH 5 DAYS Performed at [facility], [place] [address] [place], [place] [address]    Report Status 03/30/2020 FINAL  Final  Culture, blood (routine x 2)     Status: None   Collection Time: 03/25/20  6:20 AM   Specimen: BLOOD  Result Value Ref Range Status   Specimen Description   Final    BLOOD RIGHT ANTECUBITAL Performed at [facility] [address] [place], [place] [address]    Special Requests   Final    BOTTLES DRAWN AEROBIC AND ANAEROBIC Blood Culture adequate volume Performed at [facility] [address], [place], [place] [address]    Culture   Final    NO GROWTH 5 DAYS Performed at [facility], [place] [address] [place], [place] [address]    Report Status 03/30/2020 FINAL  Final  SARS Coronavirus 2 by RT PCR (hospital order, performed in [facility] hospital lab) Nasopharyngeal Nasopharyngeal Swab     Status: None   Collection Time: 03/25/20  6:22 AM   Specimen: Nasopharyngeal Swab  Result Value Ref Range Status   SARS Coronavirus 2 NEGATIVE NEGATIVE Final    Comment: (NOTE) SARS-CoV-2 target nucleic acids are NOT DETECTED.  The SARS-CoV-2 RNA is generally detectable in upper and lower respiratory specimens during the acute phase of infection. The lowest concentration of SARS-CoV-2 viral copies this assay can detect is 250 copies / mL. A negative result does not preclude SARS-CoV-2 infection and should not be used as the sole basis for treatment or other patient management decisions.  A negative result may occur with improper specimen collection / handling, submission of specimen other than nasopharyngeal swab,  presence of viral mutation(s) within the areas targeted by this assay, and inadequate number of viral copies (<250  copies / mL). A negative result must be combined with clinical observations, patient history, and epidemiological information.  Fact Sheet for Patients:   StrictlyIdeas.no  Fact Sheet for Healthcare Providers: BankingDealers.co.za  This test is not yet approved or  cleared by the [place] FDA and has been authorized for detection and/or diagnosis of SARS-CoV-2 by FDA under an Emergency Use Authorization (EUA).  This EUA will remain in effect (meaning this test can be used) for the duration of the COVID-19 declaration under Section 564(b)(1) of the Act, 21 U.S.C. section 360bbb-3(b)(1), unless the authorization is terminated or revoked sooner.  Performed at [facility], [place] [address], [place], [place] [address]   Gastrointestinal Panel by PCR , Stool     Status: None   Collection Time: 03/28/20 10:20 AM   Specimen: Stool  Result Value Ref Range Status   Campylobacter species NOT DETECTED NOT DETECTED Final   Plesimonas shigelloides NOT DETECTED NOT DETECTED Final   Salmonella species NOT DETECTED NOT DETECTED Final   Yersinia enterocolitica NOT DETECTED NOT DETECTED Final   Vibrio species NOT DETECTED NOT DETECTED Final   Vibrio cholerae NOT DETECTED NOT DETECTED Final   Enteroaggregative E coli (EAEC) NOT DETECTED NOT DETECTED Final   Enteropathogenic E coli (EPEC) NOT DETECTED NOT DETECTED Final   Enterotoxigenic E coli (ETEC) NOT DETECTED NOT DETECTED Final   Shiga like toxin producing E coli (STEC) NOT DETECTED NOT DETECTED Final   Shigella/Enteroinvasive E coli (EIEC) NOT DETECTED NOT DETECTED Final   Cryptosporidium NOT DETECTED NOT DETECTED Final   Cyclospora cayetanensis NOT DETECTED NOT DETECTED Final   Entamoeba histolytica NOT DETECTED NOT DETECTED Final   Giardia lamblia NOT DETECTED NOT DETECTED Final   Adenovirus F40/41 NOT DETECTED NOT DETECTED Final   Astrovirus NOT DETECTED NOT DETECTED  Final   Norovirus GI/GII NOT DETECTED NOT DETECTED Final   Rotavirus A NOT DETECTED NOT DETECTED Final   Sapovirus (I, II, IV, and V) NOT DETECTED NOT DETECTED Final    Comment: Performed at [facility], [address], [place], [place] [address]         Radiology Studies: No results found.      Scheduled Meds: . estradiol  1 mg Oral Daily  . folic acid  2 mg Oral Daily  . gabapentin  300 mg Oral Daily   And  . gabapentin  600 mg Oral QHS  . irbesartan  37.5 mg Oral Daily  . loperamide  4 mg Oral TID  . pantoprazole  40 mg Oral Daily  . saccharomyces boulardii  250 mg Oral BID  . sodium chloride flush  3 mL Intravenous Q12H  . vitamin B-12  1,000 mcg Oral Daily   Continuous Infusions: . sodium chloride 75 mL/hr at 03/30/20 1930  . sodium chloride       LOS: 6 days     Georgette Shell, MD  03/31/2020, 10:31 AM

## 2020-04-01 ENCOUNTER — Encounter (HOSPITAL_COMMUNITY): Payer: Self-pay | Admitting: Internal Medicine

## 2020-04-01 ENCOUNTER — Inpatient Hospital Stay (HOSPITAL_COMMUNITY): Payer: Medicare Other | Admitting: Anesthesiology

## 2020-04-01 ENCOUNTER — Encounter (HOSPITAL_COMMUNITY)
Admission: EM | Disposition: A | Discharge status: Home / Self Care | Payer: Self-pay | Source: Home / Self Care | Attending: Internal Medicine

## 2020-04-01 DIAGNOSIS — K922 Gastrointestinal hemorrhage, unspecified: Secondary | ICD-10-CM | POA: Diagnosis not present

## 2020-04-01 HISTORY — PX: BIOPSY: SHX5522

## 2020-04-01 HISTORY — PX: COLONOSCOPY WITH PROPOFOL: SHX5780

## 2020-04-01 SURGERY — COLONOSCOPY WITH PROPOFOL
Anesthesia: Monitor Anesthesia Care

## 2020-04-01 MED ORDER — PROPOFOL 500 MG/50ML IV EMUL
INTRAVENOUS | Status: AC
  Filled 2020-04-01: qty 50

## 2020-04-01 MED ORDER — PROPOFOL 500 MG/50ML IV EMUL
INTRAVENOUS | Status: DC | PRN
  Administered 2020-04-01: 100 ug/kg/min via INTRAVENOUS

## 2020-04-01 MED ORDER — PROPOFOL 10 MG/ML IV BOLUS
INTRAVENOUS | Status: AC
  Filled 2020-04-01: qty 20

## 2020-04-01 MED ORDER — HYDRALAZINE HCL 20 MG/ML IJ SOLN: 5.0000 mg | INTRAMUSCULAR | Status: DC | PRN

## 2020-04-01 MED ORDER — PROPOFOL 10 MG/ML IV BOLUS
INTRAVENOUS | Status: DC | PRN
  Administered 2020-04-01 (×2): 10 mg via INTRAVENOUS
  Administered 2020-04-01: 20 mg via INTRAVENOUS

## 2020-04-01 MED ORDER — LIDOCAINE 2% (20 MG/ML) 5 ML SYRINGE
INTRAMUSCULAR | Status: DC | PRN
  Administered 2020-04-01: 100 mg via INTRAVENOUS

## 2020-04-01 SURGICAL SUPPLY — 22 items

## 2020-04-01 NOTE — Op Note (Signed)
[facility] Patient Name: Helen Hardy Procedure Date: 04/01/2020 MRN: [id number] Attending MD: Juanita Craver , MD Date of Birth: 1951/04/16 CSN: [id number] Age: 69 Admit Type: Inpatient Procedure:                Colonoscopy with biopsies. Indications:              Rectal bleeding, Abnormal CT scan; CRC screening                            for colorectal malignant neoplasm. Providers:                Wynonia Sours, RN, William Dalton, Technician,                            Juanita Craver, MD, Gala Lewandowsky, CRNA Referring MD:             Carol Ada, MD Medicines:                Monitored Anesthesia Care Complications:            No immediate complications. Estimated Blood Loss:     Estimated blood loss was minimal. Procedure:                Pre-Anesthesia Assessment: - Prior to the                            procedure, a history and physical was performed,                            and patient medications and allergies were                            reviewed. The patient's tolerance of previous                            anesthesia was also reviewed. The risks and                            benefits of the procedure and the sedation options                            and risks were discussed with the patient. All                            questions were answered, and informed consent was                            obtained. Prior Anticoagulants: The patient has                            taken no previous anticoagulant or antiplatelet                            agents. ASA Grade Assessment: III - A patient with  severe systemic disease. After reviewing the risks                            and benefits, the patient was deemed in                            satisfactory condition to undergo the procedure.                            After obtaining informed consent, the colonoscope                            was passed under direct vision.  Throughout the                            procedure, the patient's blood pressure, pulse, and                            oxygen saturations were monitored continuously. The                            CF-HQ190L ([id number] Olympus colonoscope was                            introduced through the anus and advanced to the the                            cecum, identified by appendiceal orifice and                            ileocecal valve. The colonoscopy was extremely                            difficult due to inadequate bowel prep. Completion                            of the procedure was aided by lavage. The patient                            tolerated the procedure well. The quality of the                            bowel preparation was fair, at best with aggressive                            lavage. The ileocecal valve, the appendiceal                            orifice and the rectum were photographed. The bowel                            preparation used was NuLytely via split dose  instruction. Scope In: 2:42:39 PM Scope Out: 3:00:27 PM Scope Withdrawal Time: 0 hours 8 minutes 54 seconds  Total Procedure Duration: 0 hours 17 minutes 48 seconds  Findings:      A few small and large-mouthed diverticula were found in the entire colon.      A continuous area of oozing, ulcerated mucosa with stigmata of recent       bleeding was present at the splenic flexure-ischemic colitis in the       watershed area-biopsies done.      A patch of oozing, ulcerated mucosa was present in the cecum-biopsies       done.      No additional abnormalities were found on retroflexion.      Multiple superficial ulcerations on the perianal skin noted on anal       inspection. Impression:               - Preparation of the colon was fair at best with                            aggressive lavage.                           - Multiple ulcerations with skin breakdown over                             both buttocks and in the perianal region.                           - Scattered diverticulosis in the entire examined                            colon.                           - Mucosal ulceration from 70-90 cm-watershed                            region-consistent with ischemic colitis-biopsies                            done.                           - Mucosal ulceration in the cecal base-biopsied. Moderate Sedation:      MAC used. Recommendation:           - Full liquid diet today.                           - Continue present medications.                           - Await pathology results. Procedure Code(s):        --- Professional ---                           [phone], Colonoscopy, flexible; with biopsy, single  or multiple Diagnosis Code(s):        --- Professional ---                           K62.5, Hemorrhage of anus and rectum                           R93.3, Abnormal findings on diagnostic imaging of                            other parts of digestive tract                           K63.3, Ulcer of intestine                           Z12.11, Encounter for screening for malignant                            neoplasm of colon                           K57.30, Diverticulosis of large intestine without                            perforation or abscess without bleeding CPT copyright 2019 [employer]. All rights reserved. The codes documented in this report are preliminary and upon coder review may  be revised to meet current compliance requirements. Juanita Craver, MD Juanita Craver, MD 04/01/2020 3:24:54 PM This report has been signed electronically. Number of Addenda: 0

## 2020-04-01 NOTE — Progress Notes (Addendum)
PROGRESS NOTE    Helen Hardy  ONG:[id number] DOB: 12/19/1950 DOA: 03/25/2020 PCP: Deland Pretty, MD    Brief Narrative: 69 year old female with past medical history of diverticulitis, rheumatoid arthritis, recent enteropathogenic E. coli GI infection in June, hypertension, chronic radicular lumbar pain, morbid obesity, non-Hodgkin's lymphoma (now in remission) who was brought in from home by EMS with complaints of constant moderate lower abdominal pain since yesterday with bright red blood per rectum and blood when wiping after a bowel movement.  ED Course: T103F, HR 116, RR 29, normotensive on room air.  Notable labs: BUN 29, creatinine 1.37 (baseline 0.8), calcium 8.9 (ionized calcium 0.88 but normal albumin-lab repeated and pending), COVID-19 negative.  CT abdomen pelvis with contrast most notable for: Segmental wall thickening and fat stranding of the transverse colon as well as base of the cecum consistent with infectious VS inflammatory VS ischemic colitis however favoring Crohn's disease.  Dr. Collene Mares, GI, consulted in the ED and believes this to be Crohn's disease.  She was started on Rocephin, Flagyl and Cipro and GI is to consult.  Assessment & Plan:   Principal Problem:   GI bleed Active Problems:   Rheumatoid arthritis ([place])   Essential hypertension   AKI (acute kidney injury) ([place])   SIRS (systemic inflammatory response syndrome) ([facility])   #1 acute diverticulitis versus infectious colitis-patient presented with high fever abdominal pain bloody diarrhea.  At the time of admission she met sepsis criteria with fever of 103, tachypnea  tachycardia hypotension with no leukocytosis. She has been treated with IV fluids Cipro and Flagyl. Antibiotics were stopped after 4 days since stool culture did not grow anything. However her lactoferrin was positive which is likely secondary to inflammation. She is scheduled to have colonoscopy today.  Will advance diet after colonoscopy. Consult  PT CT abdomen and pelvis 03/25/2020 with segmental wall thickening and fat stranding of the transverse colon as well as the base of the cecum consistent with infectious versus inflammatory versus ischemic colitis however favoring Crohn's disease.  Stool studies positive for  Lactoferrin Antibiotics stopped 9/24  #2 history of rheumatoid arthritis prednisone on hold  #3  Patient was initially hypotensive on admission.  Currently her blood pressure is 159/105.  Continue Avapro and as needed IV hydralazine will be ordered.   Estimated body mass index is 45.6 kg/m as calculated from the following:   Height as of this encounter: 5' (1.524 m).   Weight as of this encounter: 105.9 kg.  DVT prophylaxis: SCD due to bleeding per rectum  code Status: Full code Family Communication: None at bedside  disposition Plan:  Status is: Inpatient  Dispo: The patient is from:home              Anticipated d/c is to: Home              Anticipated d/c date is: More than 2 days              Patient currently is not medically stable to d/c.  Patient admitted with abdominal pain fever and bloody bowel movements on IV fluids,continues with rectal tube..   Consultants:   GI  Procedures: None Antimicrobials- Anti-infectives (From admission, onward)   Start     Dose/Rate Route Frequency Ordered Stop   03/25/20 2200  ciprofloxacin (CIPRO) IVPB 400 mg  Status:  Discontinued        400 mg 200 mL/hr over 60 Minutes Intravenous Every 12 hours 03/25/20 1142 03/29/20 1503   03/25/20  1600  metroNIDAZOLE (FLAGYL) IVPB 500 mg  Status:  Discontinued        500 mg 100 mL/hr over 60 Minutes Intravenous Every 8 hours 03/25/20 1125 03/28/20 1509   03/25/20 1000  ciprofloxacin (CIPRO) IVPB 400 mg        400 mg 200 mL/hr over 60 Minutes Intravenous  Once 03/25/20 0952 03/25/20 1122   03/25/20 0715  cefTRIAXone (ROCEPHIN) 2 g in sodium chloride 0.9 % 100 mL IVPB       "And" Linked Group Details   2 g 200 mL/hr over 30  Minutes Intravenous  Once 03/25/20 0708 03/25/20 0800   03/25/20 0715  metroNIDAZOLE (FLAGYL) IVPB 500 mg       "And" Linked Group Details   500 mg 100 mL/hr over 60 Minutes Intravenous  Once 03/25/20 0708 03/25/20 0936      Subjective: Patient resting in bed appears very weak rectal tube with almost clear bowels.  Objective: Vitals:   03/31/20 0522 03/31/20 1415 03/31/20 2314 04/01/20 0618  BP: 139/76 (!) 149/78 (!) 155/92 (!) 159/105  Pulse: 89 88 86 90  Resp: 18 17 18 18   Temp: 99.6 F (37.6 C) 100.3 F (37.9 C) 99.1 F (37.3 C) 99.1 F (37.3 C)  TempSrc: Oral Oral Oral Oral  SpO2: 96% 99% 96% 95%  Weight:      Height:        Intake/Output Summary (Last 24 hours) at 04/01/2020 1001 Last data filed at 04/01/2020 0800 Gross per 24 hour  Intake 2526.9 ml  Output 2200 ml  Net 326.9 ml   Filed Weights   03/25/20 1325  Weight: 105.9 kg    Examination:RECTAL TUBE IN PLACE loose bms has been drinking golytely  General exam: Appears calm and comfortable  Respiratory system: Clear to auscultation. Respiratory effort normal. Cardiovascular system: S1 & S2 heard, RRR. No JVD, murmurs, rubs, gallops or clicks. No pedal edema. Gastrointestinal system: Abdomen is nondistended, soft and tender. No organomegaly or masses felt. Normal bowel sounds heard. Central nervous system: Alert and oriented. No focal neurological deficits. Extremities: trace edema Skin: No rashes, lesions or ulcers Psychiatry: Judgement and insight appear normal. Mood & affect appropriate.     Data Reviewed: I have personally reviewed following labs and imaging studies  CBC: Recent Labs  Lab 03/26/20 0723 03/27/20 0812 03/28/20 0606 03/31/20 1120  WBC 7.5 4.5 3.7* 4.3  HGB 8.7* 8.2* 8.8* 8.4*  HCT 27.3* 25.9* 27.9* 25.9*  MCV 115.7* 116.1* 115.3* 110.7*  PLT 115* 105* 103* 73*   Basic Metabolic Panel: Recent Labs  Lab 03/26/20 0723 03/27/20 0812 03/28/20 0606 03/31/20 1120  NA 138 139  140 137  K 4.1 3.6 3.9 3.4*  CL 108 110 108 109  CO2 22 21* 22 17*  GLUCOSE 79 82 87 65*  BUN 20 13 9  <5*  CREATININE 1.17* 1.18* 1.10* 0.87  CALCIUM 7.8* 7.9* 8.2* 7.7*   GFR: Estimated Creatinine Clearance: 66.2 mL/min (by C-G formula based on SCr of 0.87 mg/dL). Liver Function Tests: Recent Labs  Lab 03/27/20 0812 03/28/20 0606 03/31/20 1120  AST 26 29 83*  ALT 20 20 46*  ALKPHOS 51 54 65  BILITOT 0.8 0.8 0.8  PROT 4.9* 5.2* 4.9*  ALBUMIN 2.5* 2.7* 2.3*   No results for input(s): LIPASE, AMYLASE in the last 168 hours. No results for input(s): AMMONIA in the last 168 hours. Coagulation Profile: No results for input(s): INR, PROTIME in the last 168 hours. Cardiac Enzymes: No  results for input(s): CKTOTAL, CKMB, CKMBINDEX, TROPONINI in the last 168 hours. BNP (last 3 results) No results for input(s): PROBNP in the last 8760 hours. HbA1C: No results for input(s): HGBA1C in the last 72 hours. CBG: No results for input(s): GLUCAP in the last 168 hours. Lipid Profile: No results for input(s): CHOL, HDL, LDLCALC, TRIG, CHOLHDL, LDLDIRECT in the last 72 hours. Thyroid Function Tests: No results for input(s): TSH, T4TOTAL, FREET4, T3FREE, THYROIDAB in the last 72 hours. Anemia Panel: No results for input(s): VITAMINB12, FOLATE, FERRITIN, TIBC, IRON, RETICCTPCT in the last 72 hours. Sepsis Labs: No results for input(s): PROCALCITON, LATICACIDVEN in the last 168 hours.  Recent Results (from the past 240 hour(s))  Culture, blood (routine x 2)     Status: None   Collection Time: 03/25/20  6:10 AM   Specimen: BLOOD RIGHT HAND  Result Value Ref Range Status   Specimen Description   Final    BLOOD RIGHT HAND Performed at [facility] [address], [place], [place] [address]    Special Requests   Final    BOTTLES DRAWN AEROBIC AND ANAEROBIC Blood Culture adequate volume Performed at [facility] [address], [place], [place]  [address]    Culture   Final    NO GROWTH 5 DAYS Performed at [facility], [place] [address] [place], [place] [address]    Report Status 03/30/2020 FINAL  Final  Culture, blood (routine x 2)     Status: None   Collection Time: 03/25/20  6:20 AM   Specimen: BLOOD  Result Value Ref Range Status   Specimen Description   Final    BLOOD RIGHT ANTECUBITAL Performed at [facility] [address], [place], [place] [address]    Special Requests   Final    BOTTLES DRAWN AEROBIC AND ANAEROBIC Blood Culture adequate volume Performed at [facility] [address] [place], [place] [address]    Culture   Final    NO GROWTH 5 DAYS Performed at [facility], [place] [address], [place], [place] [address]    Report Status 03/30/2020 FINAL  Final  SARS Coronavirus 2 by RT PCR (hospital order, performed in [facility] hospital lab) Nasopharyngeal Nasopharyngeal Swab     Status: None   Collection Time: 03/25/20  6:22 AM   Specimen: Nasopharyngeal Swab  Result Value Ref Range Status   SARS Coronavirus 2 NEGATIVE NEGATIVE Final    Comment: (NOTE) SARS-CoV-2 target nucleic acids are NOT DETECTED.  The SARS-CoV-2 RNA is generally detectable in upper and lower respiratory specimens during the acute phase of infection. The lowest concentration of SARS-CoV-2 viral copies this assay can detect is 250 copies / mL. A negative result does not preclude SARS-CoV-2 infection and should not be used as the sole basis for treatment or other patient management decisions.  A negative result may occur with improper specimen collection / handling, submission of specimen other than nasopharyngeal swab, presence of viral mutation(s) within the areas targeted by this assay, and inadequate number of viral copies (<250 copies / mL). A negative result must be combined with clinical observations, patient history, and epidemiological information.  Fact Sheet for Patients:     StrictlyIdeas.no  Fact Sheet for Healthcare Providers: BankingDealers.co.za  This test is not yet approved or  cleared by the [place] FDA and has been authorized for detection and/or diagnosis of SARS-CoV-2 by FDA under an Emergency Use Authorization (EUA).  This EUA will remain in effect (meaning this test can be used) for the duration of the COVID-19 declaration under Section 564(b)(1) of the Act, 21 U.S.C. section 360bbb-3(b)(1), unless the authorization is terminated or revoked sooner.  Performed at [facility], [place] [address] [place], [place] [address]   Gastrointestinal Panel by PCR , Stool     Status: None   Collection Time: 03/28/20 10:20 AM   Specimen: Stool  Result Value Ref Range Status   Campylobacter species NOT DETECTED NOT DETECTED Final   Plesimonas shigelloides NOT DETECTED NOT DETECTED Final   Salmonella species NOT DETECTED NOT DETECTED Final   Yersinia enterocolitica NOT DETECTED NOT DETECTED Final   Vibrio species NOT DETECTED NOT DETECTED Final   Vibrio cholerae NOT DETECTED NOT DETECTED Final   Enteroaggregative E coli (EAEC) NOT DETECTED NOT DETECTED Final   Enteropathogenic E coli (EPEC) NOT DETECTED NOT DETECTED Final   Enterotoxigenic E coli (ETEC) NOT DETECTED NOT DETECTED Final   Shiga like toxin producing E coli (STEC) NOT DETECTED NOT DETECTED Final   Shigella/Enteroinvasive E coli (EIEC) NOT DETECTED NOT DETECTED Final   Cryptosporidium NOT DETECTED NOT DETECTED Final   Cyclospora cayetanensis NOT DETECTED NOT DETECTED Final   Entamoeba histolytica NOT DETECTED NOT DETECTED Final   Giardia lamblia NOT DETECTED NOT DETECTED Final   Adenovirus F40/41 NOT DETECTED NOT DETECTED Final   Astrovirus NOT DETECTED NOT DETECTED Final   Norovirus GI/GII NOT DETECTED NOT DETECTED Final   Rotavirus A NOT DETECTED NOT DETECTED Final   Sapovirus (I, II, IV, and V) NOT DETECTED NOT  DETECTED Final    Comment: Performed at [facility], [address], [place],  [address]         Radiology Studies: No results found.      Scheduled Meds: . estradiol  1 mg Oral Daily  . folic acid  2 mg Oral Daily  . gabapentin  300 mg Oral Daily   And  . gabapentin  600 mg Oral QHS  . irbesartan  37.5 mg Oral Daily  . loperamide  4 mg Oral TID  . pantoprazole  40 mg Oral Daily  . saccharomyces boulardii  250 mg Oral BID  . sodium chloride flush  3 mL Intravenous Q12H  . vitamin B-12  1,000 mcg Oral Daily   Continuous Infusions: . sodium chloride 75 mL/hr at 03/31/20 2030  . sodium chloride       LOS: 7 days     Georgette Shell, MD  04/01/2020, 10:01 AM

## 2020-04-01 NOTE — Anesthesia Postprocedure Evaluation (Signed)
Anesthesia Post Note  Patient: Helen Hardy  Procedure(s) Performed: COLONOSCOPY WITH PROPOFOL (N/A ) BIOPSY     Patient location during evaluation: PACU Anesthesia Type: MAC Level of consciousness: awake and alert Pain management: pain level controlled Vital Signs Assessment: post-procedure vital signs reviewed and stable Respiratory status: spontaneous breathing Cardiovascular status: stable Anesthetic complications: no   No complications documented.  Last Vitals:  Vitals:   04/01/20 1530 04/01/20 1552  BP: 140/75 138/81  Pulse: 82 80  Resp: 15 16  Temp:  37 C  SpO2: 95% 93%    Last Pain:  Vitals:   04/01/20 1614  TempSrc:   PainSc: [place]

## 2020-04-01 NOTE — Progress Notes (Signed)
PT Cancellation Note  Patient Details Name: Helen Hardy MRN: [id number] DOB: 1950/10/03   Cancelled Treatment:     Colonoscopy today Pt has been evaluated with rec for [facility] Will attempt to see another day as schedule permits    Nathanial Rancher 04/01/2020, 2:34 PM

## 2020-04-01 NOTE — Progress Notes (Signed)
Removed flexiseal from rectum per Dr Collene Mares, no fluid noted in balloon port, safety maintained

## 2020-04-01 NOTE — Transfer of Care (Signed)
Immediate Anesthesia Transfer of Care Note  Patient: Helen Hardy  Procedure(s) Performed: COLONOSCOPY WITH PROPOFOL (N/A ) BIOPSY  Patient Location: Endoscopy Unit  Anesthesia Type:MAC  Level of Consciousness: drowsy  Airway & Oxygen Therapy: Patient Spontanous Breathing and Patient connected to face mask oxygen  Post-op Assessment: Report given to RN and Post -op Vital signs reviewed and stable  Post vital signs: Reviewed and stable  Last Vitals:  Vitals Value Taken Time  BP 113/75 04/01/20 1510  Temp    Pulse 91 04/01/20 1511  Resp 44 04/01/20 1511  SpO2 100 % 04/01/20 1511  Vitals shown include unvalidated device data.  Last Pain:  Vitals:   04/01/20 1351  TempSrc: Oral  PainSc: 5       Patients Stated Pain Goal: 3 (81/18/86 7737)  Complications: No complications documented.

## 2020-04-01 NOTE — Care Management Important Message (Signed)
Important Message  Patient Details IM Letter given to the Patient Name: Helen Hardy MRN: [id number] Date of Birth: [birth year]   Medicare Important Message Given:  Yes     Kerin Salen 04/01/2020, 11:10 AM

## 2020-04-02 ENCOUNTER — Encounter (HOSPITAL_COMMUNITY): Payer: Self-pay | Admitting: Gastroenterology

## 2020-04-02 DIAGNOSIS — K922 Gastrointestinal hemorrhage, unspecified: Secondary | ICD-10-CM | POA: Diagnosis not present

## 2020-04-02 LAB — GLUCOSE, CAPILLARY
Glucose-Capillary: 52 mg/dL — ABNORMAL LOW (ref 70–99)
Glucose-Capillary: 50 mg/dL — ABNORMAL LOW (ref 70–99)
Glucose-Capillary: 74 mg/dL (ref 70–99)
Glucose-Capillary: 69 mg/dL — ABNORMAL LOW (ref 70–99)
Glucose-Capillary: 83 mg/dL (ref 70–99)
Glucose-Capillary: 94 mg/dL (ref 70–99)

## 2020-04-02 LAB — CBC
HCT: 27.2 % — ABNORMAL LOW (ref 36.0–46.0)
Hemoglobin: 8.6 g/dL — ABNORMAL LOW (ref 12.0–15.0)
MCH: 35.5 pg — ABNORMAL HIGH (ref 26.0–34.0)
MCHC: 31.6 g/dL (ref 30.0–36.0)
MCV: 112.4 fL — ABNORMAL HIGH (ref 80.0–100.0)
Platelets: 84 10*3/uL — ABNORMAL LOW (ref 150–400)
RBC: 2.42 MIL/uL — ABNORMAL LOW (ref 3.87–5.11)
RDW: 16.1 % — ABNORMAL HIGH (ref 11.5–15.5)
WBC: 4.7 10*3/uL (ref 4.0–10.5)
nRBC: 0 % (ref 0.0–0.2)

## 2020-04-02 LAB — COMPREHENSIVE METABOLIC PANEL
ALT: 35 U/L (ref 0–44)
AST: 47 U/L — ABNORMAL HIGH (ref 15–41)
Albumin: 2.4 g/dL — ABNORMAL LOW (ref 3.5–5.0)
Alkaline Phosphatase: 68 U/L (ref 38–126)
Anion gap: 11 (ref 5–15)
BUN: <5 mg/dL — ABNORMAL LOW (ref 8–23)
CO2: 14 mmol/L — ABNORMAL LOW (ref 22–32)
Calcium: 7.7 mg/dL — ABNORMAL LOW (ref 8.9–10.3)
Chloride: 111 mmol/L (ref 98–111)
Creatinine, Ser: 0.79 mg/dL (ref 0.44–1.00)
GFR calc Af Amer: >60 mL/min (ref >60)
GFR calc non Af Amer: >60 mL/min (ref >60)
Glucose, Bld: 53 mg/dL — ABNORMAL LOW (ref 70–99)
Potassium: 3.3 mmol/L — ABNORMAL LOW (ref 3.5–5.1)
Sodium: 136 mmol/L (ref 135–145)
Total Bilirubin: 0.9 mg/dL (ref 0.3–1.2)
Total Protein: 5 g/dL — ABNORMAL LOW (ref 6.5–8.1)

## 2020-04-02 LAB — MAGNESIUM: Magnesium: 1.8 mg/dL (ref 1.7–2.4)

## 2020-04-02 LAB — SURGICAL PATHOLOGY

## 2020-04-02 MED ORDER — POTASSIUM CHLORIDE 10 MEQ/100ML IV SOLN
10.0000 meq | INTRAVENOUS | Status: AC
  Administered 2020-04-02 (×3): 10 meq via INTRAVENOUS
  Filled 2020-04-02 (×3): qty 100

## 2020-04-02 MED ORDER — GLUCOSE 40 % PO GEL
2.0000 | ORAL | Status: AC
  Administered 2020-04-02: 75 g via ORAL
  Filled 2020-04-02: qty 2

## 2020-04-02 MED ORDER — LIP MEDEX EX OINT
TOPICAL_OINTMENT | CUTANEOUS | Status: AC
  Administered 2020-04-02: 1
  Filled 2020-04-02: qty 7

## 2020-04-02 NOTE — Progress Notes (Signed)
Subjective: She complains about her bed sores.  No diarrhea.  Objective: Vital signs in last 24 hours: Temp:  [98.7 F (37.1 C)-99.6 F (37.6 C)] 99.6 F (37.6 C) (09/28 1428) Pulse Rate:  [89-102] 102 (09/28 1428) Resp:  [17-20] 20 (09/28 1428) BP: (138-177)/(80-97) 138/97 (09/28 1428) SpO2:  [96 %-97 %] 96 % (09/28 1428) Last BM Date: 04/02/20  Intake/Output from previous day: 09/27 0701 - 09/28 0700 In: 1684.1 [P.O.:180; I.V.:1504.1] Out: 1500 [Urine:1450; Stool:50] Intake/Output this shift: Total I/O In: 200 [IV Piggyback:200] Out: 915 [Urine:650; Emesis/NG output:25; Stool:240]  General appearance: alert and no distress GI: soft, non-tender; bowel sounds normal; no masses,  no organomegaly  Lab Results: Recent Labs    03/31/20 1120 04/02/20 0549  WBC 4.3 4.7  HGB 8.4* 8.6*  HCT 25.9* 27.2*  PLT 73* 84*   BMET Recent Labs    03/31/20 1120 04/02/20 0549  NA 137 136  K 3.4* 3.3*  CL 109 111  CO2 17* 14*  GLUCOSE 65* 53*  BUN <5* <5*  CREATININE 0.87 0.79  CALCIUM 7.7* 7.7*   LFT Recent Labs    04/02/20 0549  PROT 5.0*  ALBUMIN 2.4*  AST 47*  ALT 35  ALKPHOS 68  BILITOT 0.9   PT/INR No results for input(s): LABPROT, INR in the last 72 hours. Hepatitis Panel No results for input(s): HEPBSAG, HCVAB, HEPAIGM, HEPBIGM in the last 72 hours. C-Diff No results for input(s): CDIFFTOX in the last 72 hours. Fecal Lactopherrin No results for input(s): FECLLACTOFRN in the last 72 hours.  Studies/Results: No results found.  Medications:  Scheduled: . estradiol  1 mg Oral Daily  . folic acid  2 mg Oral Daily  . gabapentin  300 mg Oral Daily   And  . gabapentin  600 mg Oral QHS  . irbesartan  37.5 mg Oral Daily  . loperamide  4 mg Oral TID  . pantoprazole  40 mg Oral Daily  . saccharomyces boulardii  250 mg Oral BID  . sodium chloride flush  3 mL Intravenous Q12H  . vitamin B-12  1,000 mcg Oral Daily   Continuous:   Assessment/Plan: 1)  Ischemic colitis. 2) Bed sores.   She denies any further diarrhea today.  This issue should resolve on its own and she can maintain loperamide to control her symptoms.  Plan: 1) No further GI intervention. 2) Follow up in 2 weeks upon follow up. 3) Signing off.  LOS: 8 days   Helen Hardy D 04/02/2020, 6:23 PM

## 2020-04-02 NOTE — Progress Notes (Signed)
Pt given PRN Norco tablet. She was sitting on the side of the bed, after she swallowed it she began to vomit and spit the tablet out into the medicine cup. The tablet had begun to dissolve and the pt did not want to attempt to swallow it again. The tablet was wasted in the sharps box with Apolonio Schneiders, PT. I got another tablet from the pyxis and the patient swallowed it and did not vomit afterwards.

## 2020-04-02 NOTE — Progress Notes (Signed)
MEWS 3, temperature 102.8. Tylenol 650 administered. Paged Dr. Sharlet [place]    04/02/20 2144  Vitals  Temp (!) 102.8 F (39.3 C)  Temp Source Oral  BP (!) 143/66  MAP (mmHg) 90  BP Location Right Arm  BP Method Automatic  Patient Position (if appropriate) Lying  Pulse Rate (!) 105  Resp 17  MEWS COLOR  MEWS Score Color Yellow  Oxygen Therapy  SpO2 92 %  O2 Device Room Air

## 2020-04-02 NOTE — Progress Notes (Signed)
Physical Therapy Treatment Patient Details Name: Helen Hardy MRN: [id number] DOB: 11/12/1950 Today's Date: 04/02/2020    History of Present Illness 69 y.o. female with medical history significant of non-Hodgkin's lymphoma (dx in 2004) in remission, rheumatoid arthritis since age 77, chronic pain, hypertension, GERD, osteoarthritis, lumbar radiculopathy, morbid obesity, anemia of chronic disease and admitted for abdominal pain with some rectal bleeding with bowel movements    PT Comments    Patient limited today by nausea and emesis. She required increased assist for bed mobility and transfers with RW. Patient c/o dizziness after mobilizing to recliner and found to be hypoglycemic. RN with pt and cola provided. Acute PT will continue to progress as able; will re-assess pt's therapy needs for discharge next session to determine most appropriate below venue.   Follow Up Recommendations  Home health PT;SNF (pt receiving HHPT 1x/week PTA; will continue to assess)     Equipment Recommendations   (TBA)    Recommendations for Other Services       Precautions / Restrictions Precautions Precautions: Fall Restrictions Weight Bearing Restrictions: No    Mobility  Bed Mobility Overal bed mobility: Needs Assistance Bed Mobility: Supine to Sit     Supine to sit: HOB elevated;Mod assist     General bed mobility comments: assist to bring LE's off EOB and to raise trunk upright.   Transfers Overall transfer level: Needs assistance Equipment used: Rolling walker (2 wheeled) Transfers: Sit to/from [facility] Sit to Stand: Min assist;From elevated surface;+2 safety/equipment;+2 physical assistance Stand pivot transfers: Min assist       General transfer comment: cues for safe hand placement on RW, +2 min assist for power up and to steady in standing. Pt required assist to manage RW position to complete stand step transfer to recliner.    Ambulation/Gait Ambulation/Gait assistance: Min assist Gait Distance (Feet): 4 Feet Assistive device: Rolling walker (2 wheeled) Gait Pattern/deviations: Step-through pattern;Decreased stride length Gait velocity: decr   General Gait Details: cues and assist for managing RW during stand step transfers   Stairs             Wheelchair Mobility    Modified Rankin (Stroke Patients Only)       Balance Overall balance assessment: Needs assistance Sitting-balance support: Feet supported Sitting balance-Leahy Scale: Fair     Standing balance support: Bilateral upper extremity supported;During functional activity Standing balance-Leahy Scale: Poor               Cognition Arousal/Alertness: Awake/alert Behavior During Therapy: WFL for tasks assessed/performed Overall Cognitive Status: Within Functional Limits for tasks assessed                     Exercises      General Comments General comments (skin integrity, edema, etc.): pt with c/o nausea and episode of emesis while sitting EOB. pain and nausea meds provided by RN. Pt with elevated BP (Cuff noted to be incorrect size) correct size obtained and pressure 152/52. Pt c/o dizziness and nausea sitting in recliner. Blood sugar assessed and pt noted to be hypoglycemic. Cola provided and RN with pt at EOS.      Pertinent Vitals/Pain Pain Assessment: Faces Faces Pain Scale: Hurts little more Pain Location: generalized from RA, pt also nauseous Pain Descriptors / Indicators: Sore;Aching Pain Intervention(s): Limited activity within patient's tolerance;Monitored during session;RN gave pain meds during session;Repositioned           PT Goals (current goals can now be found  in the care plan section) Acute Rehab PT Goals Patient Stated Goal: get well PT Goal Formulation: With patient Time For Goal Achievement: 04/09/20 Potential to Achieve Goals: Good Progress towards PT goals: Progressing toward goals     Frequency    Min 3X/week      PT Plan Current plan remains appropriate    Co-evaluation              AM-PAC PT "6 Clicks" Mobility   Outcome Measure  Help needed turning from your back to your side while in a flat bed without using bedrails?: A Little Help needed moving from lying on your back to sitting on the side of a flat bed without using bedrails?: A Little Help needed moving to and from a bed to a chair (including a wheelchair)?: A Little Help needed standing up from a chair using your arms (e.g., wheelchair or bedside chair)?: A Little Help needed to walk in hospital room?: A Little Help needed climbing 3-5 steps with a railing? : A Lot 6 Click Score: 17    End of Session Equipment Utilized During Treatment: Gait belt Activity Tolerance: Patient limited by fatigue Patient left: in chair;with call bell/phone within reach;with chair alarm set;with family/visitor present;with nursing/sitter in room Nurse Communication: Mobility status PT Visit Diagnosis: Difficulty in walking, not elsewhere classified (R26.2);Muscle weakness (generalized) (M62.81)     Time: 5747-3403 PT Time Calculation (min) (ACUTE ONLY): 39 min  Charges:  $Therapeutic Activity: 23-37 mins                    Verner Mould, DPT Acute Rehabilitation Services  Office [phone] Pager [phone]  04/02/2020 6:57 PM

## 2020-04-02 NOTE — Progress Notes (Addendum)
PROGRESS NOTE    Helen Hardy  WUX:[id number] DOB: 24-Mar-1951 DOA: 03/25/2020 PCP: Deland Pretty, MD    Brief Narrative: 69 year old female with past medical history of diverticulitis, rheumatoid arthritis, recent enteropathogenic E. coli GI infection in June, hypertension, chronic radicular lumbar pain, morbid obesity, non-Hodgkin's lymphoma (now in remission) who was brought in from home by EMS with complaints of constant moderate lower abdominal pain  with bright red blood per rectum.  She was admitted with possible diagnosis of acute diverticulitis versus infectious colitis.  She continued to have diarrhea.  She had a colonoscopy 04/01/2020 which was consistent with ischemic colitis a biopsy was taken.  Biopsies pending at this time patient continues with diarrhea.  She was initially treated with Cipro and Flagyl which has been stopped.  Her stool culture negative.  Lactoferrin positive.  Assessment & Plan:   Principal Problem:   GI bleed Active Problems:   Rheumatoid arthritis ([place])   Essential hypertension   AKI (acute kidney injury) ([place])   SIRS (systemic inflammatory response syndrome) ([facility])   #1  Ischemic colitis status post colonoscopy 04/01/2020 with findings of the same.  Advance diet to soft diet today.  Patient has been mostly n.p.o. for the last 8 days because of abdominal pain and preparation for colonoscopy.  She was initially treated with Cipro and Flagyl.  At the time of admission she met sepsis criteria with fever of 103, tachypnea  tachycardia hypotension with no leukocytosis.  Antibiotics were stopped after 4 days since stool culture did not grow anything. However her lactoferrin was positive which is likely secondary to inflammation. CT abdomen and pelvis 03/25/2020 with segmental wall thickening and fat stranding of the transverse colon as well as the base of the cecum consistent with infectious versus inflammatory versus ischemic colitis however favoring Crohn's  disease.  DC IV fluids DC rectal tube out of bed ambulate PT consult  #2 history of rheumatoid arthritis prednisone on hold  #3  Hypertension patient was initially hypotensive on admission.  Blood pressure 138/97 on Avapro.    #4 hypokalemia potassium 3.3 replete check magnesium level  Estimated body mass index is 45.6 kg/m as calculated from the following:   Height as of this encounter: 5' (1.524 m).   Weight as of this encounter: 105.9 kg.  DVT prophylaxis: SCD due to bleeding per rectum  code Status: Full code Family Communication: None at bedside  disposition Plan:  Status is: Inpatient  Dispo: The patient is from:home              Anticipated d/c is to: Home versus SNF              Anticipated d/c date is: More than 2 days              Patient currently is not medically stable to d/c.  Patient admitted with abdominal pain fever and bloody bowel movements on IV fluids,continues with rectal tube..   Consultants:   GI  Procedures: None Antimicrobials- Anti-infectives (From admission, onward)   Start     Dose/Rate Route Frequency Ordered Stop   03/25/20 2200  ciprofloxacin (CIPRO) IVPB 400 mg  Status:  Discontinued        400 mg 200 mL/hr over 60 Minutes Intravenous Every 12 hours 03/25/20 1142 03/29/20 1503   03/25/20 1600  metroNIDAZOLE (FLAGYL) IVPB 500 mg  Status:  Discontinued        500 mg 100 mL/hr over 60 Minutes Intravenous Every 8 hours 03/25/20  1125 03/28/20 1509   03/25/20 1000  ciprofloxacin (CIPRO) IVPB 400 mg        400 mg 200 mL/hr over 60 Minutes Intravenous  Once 03/25/20 0952 03/25/20 1122   03/25/20 0715  cefTRIAXone (ROCEPHIN) 2 g in sodium chloride 0.9 % 100 mL IVPB       "And" Linked Group Details   2 g 200 mL/hr over 30 Minutes Intravenous  Once 03/25/20 0708 03/25/20 0800   03/25/20 0715  metroNIDAZOLE (FLAGYL) IVPB 500 mg       "And" Linked Group Details   500 mg 100 mL/hr over 60 Minutes Intravenous  Once 03/25/20 0708 03/25/20 0936       Subjective: Patient is resting in bed very weak and tired was hypoglycemic this morning blood sugar dropped to the 50s Started a diet today was on IV fluids rectal tube in place Denies abdominal pain Set up by the side of the bed with physical therapy and patient had nausea and vomiting and was found to be hypoglycemic  Objective: Vitals:   04/01/20 1530 04/01/20 1552 04/01/20 2101 04/02/20 0505  BP: 140/75 138/81 (!) 177/90 139/80  Pulse: 82 80 89 (!) 101  Resp: 15 16 17 17   Temp:  98.6 F (37 C) 98.7 F (37.1 C) 99.6 F (37.6 C)  TempSrc:  Oral Oral Oral  SpO2: 95% 93% 97% 97%  Weight:      Height:        Intake/Output Summary (Last 24 hours) at 04/02/2020 1425 Last data filed at 04/02/2020 0500 Gross per 24 hour  Intake 1324.16 ml  Output 1200 ml  Net 124.16 ml   Filed Weights   03/25/20 1325 04/01/20 1351  Weight: 105.9 kg 105.9 kg    Examination:RECTAL TUBE IN PLACE General exam: Appears calm and comfortable  Respiratory system: Clear to auscultation. Respiratory effort normal. Cardiovascular system: S1 & S2 heard, RRR. No JVD, murmurs, rubs, gallops or clicks. No pedal edema. Gastrointestinal system: Abdomen is nondistended, soft and tender. No organomegaly or masses felt. Normal bowel sounds heard. Central nervous system: Alert and oriented. No focal neurological deficits. Extremities: trace edema Skin: No rashes, lesions or ulcers Psychiatry: Judgement and insight appear normal. Mood & affect appropriate.     Data Reviewed: I have personally reviewed following labs and imaging studies  CBC: Recent Labs  Lab 03/27/20 0812 03/28/20 0606 03/31/20 1120 04/02/20 0549  WBC 4.5 3.7* 4.3 4.7  HGB 8.2* 8.8* 8.4* 8.6*  HCT 25.9* 27.9* 25.9* 27.2*  MCV 116.1* 115.3* 110.7* 112.4*  PLT 105* 103* 73* 84*   Basic Metabolic Panel: Recent Labs  Lab 03/27/20 0812 03/28/20 0606 03/31/20 1120 04/02/20 0549  NA 139 140 137 136  K 3.6 3.9 3.4* 3.3*  CL 110  108 109 111  CO2 21* 22 17* 14*  GLUCOSE 82 87 65* 53*  BUN 13 9 <5* <5*  CREATININE 1.18* 1.10* 0.87 0.79  CALCIUM 7.9* 8.2* 7.7* 7.7*   GFR: Estimated Creatinine Clearance: 72 mL/min (by C-G formula based on SCr of 0.79 mg/dL). Liver Function Tests: Recent Labs  Lab 03/27/20 0812 03/28/20 0606 03/31/20 1120 04/02/20 0549  AST 26 29 83* 47*  ALT 20 20 46* 35  ALKPHOS 51 54 65 68  BILITOT 0.8 0.8 0.8 0.9  PROT 4.9* 5.2* 4.9* 5.0*  ALBUMIN 2.5* 2.7* 2.3* 2.4*   No results for input(s): LIPASE, AMYLASE in the last 168 hours. No results for input(s): AMMONIA in the last 168 hours. Coagulation  Profile: No results for input(s): INR, PROTIME in the last 168 hours. Cardiac Enzymes: No results for input(s): CKTOTAL, CKMB, CKMBINDEX, TROPONINI in the last 168 hours. BNP (last 3 results) No results for input(s): PROBNP in the last 8760 hours. HbA1C: No results for input(s): HGBA1C in the last 72 hours. CBG: Recent Labs  Lab 04/02/20 1318 04/02/20 1348  GLUCAP 52* 50*   Lipid Profile: No results for input(s): CHOL, HDL, LDLCALC, TRIG, CHOLHDL, LDLDIRECT in the last 72 hours. Thyroid Function Tests: No results for input(s): TSH, T4TOTAL, FREET4, T3FREE, THYROIDAB in the last 72 hours. Anemia Panel: No results for input(s): VITAMINB12, FOLATE, FERRITIN, TIBC, IRON, RETICCTPCT in the last 72 hours. Sepsis Labs: No results for input(s): PROCALCITON, LATICACIDVEN in the last 168 hours.  Recent Results (from the past 240 hour(s))  Culture, blood (routine x 2)     Status: None   Collection Time: 03/25/20  6:10 AM   Specimen: BLOOD RIGHT HAND  Result Value Ref Range Status   Specimen Description   Final    BLOOD RIGHT HAND Performed at [facility] [address], [place], [place] [address]    Special Requests   Final    BOTTLES DRAWN AEROBIC AND ANAEROBIC Blood Culture adequate volume Performed at [facility] [address]  [place], [place] [address]    Culture   Final    NO GROWTH 5 DAYS Performed at [facility], [place] [address] [place], [place] [address]    Report Status 03/30/2020 FINAL  Final  Culture, blood (routine x 2)     Status: None   Collection Time: 03/25/20  6:20 AM   Specimen: BLOOD  Result Value Ref Range Status   Specimen Description   Final    BLOOD RIGHT ANTECUBITAL Performed at [facility] [address] [place], [place] [address]    Special Requests   Final    BOTTLES DRAWN AEROBIC AND ANAEROBIC Blood Culture adequate volume Performed at [facility] [address], [place], [place] [address]    Culture   Final    NO GROWTH 5 DAYS Performed at [facility], [place] [address] [place], [place] [address]    Report Status 03/30/2020 FINAL  Final  SARS Coronavirus 2 by RT PCR (hospital order, performed in [facility] hospital lab) Nasopharyngeal Nasopharyngeal Swab     Status: None   Collection Time: 03/25/20  6:22 AM   Specimen: Nasopharyngeal Swab  Result Value Ref Range Status   SARS Coronavirus 2 NEGATIVE NEGATIVE Final    Comment: (NOTE) SARS-CoV-2 target nucleic acids are NOT DETECTED.  The SARS-CoV-2 RNA is generally detectable in upper and lower respiratory specimens during the acute phase of infection. The lowest concentration of SARS-CoV-2 viral copies this assay can detect is 250 copies / mL. A negative result does not preclude SARS-CoV-2 infection and should not be used as the sole basis for treatment or other patient management decisions.  A negative result may occur with improper specimen collection / handling, submission of specimen other than nasopharyngeal swab, presence of viral mutation(s) within the areas targeted by this assay, and inadequate number of viral copies (<250 copies / mL). A negative result must be combined with clinical observations, patient history, and epidemiological information.  Fact Sheet for  Patients:   StrictlyIdeas.no  Fact Sheet for Healthcare Providers: BankingDealers.co.za  This test is not yet approved or  cleared by the [place] FDA and  has been authorized for detection and/or diagnosis of SARS-CoV-2 by FDA under an Emergency Use Authorization (EUA).  This EUA will remain in effect (meaning this test can be used) for the duration of the COVID-19 declaration under Section 564(b)(1) of the Act, 21 U.S.C. section 360bbb-3(b)(1), unless the authorization is terminated or revoked sooner.  Performed at [facility], [place] [address] [place], [place] [address]   Gastrointestinal Panel by PCR , Stool     Status: None   Collection Time: 03/28/20 10:20 AM   Specimen: Stool  Result Value Ref Range Status   Campylobacter species NOT DETECTED NOT DETECTED Final   Plesimonas shigelloides NOT DETECTED NOT DETECTED Final   Salmonella species NOT DETECTED NOT DETECTED Final   Yersinia enterocolitica NOT DETECTED NOT DETECTED Final   Vibrio species NOT DETECTED NOT DETECTED Final   Vibrio cholerae NOT DETECTED NOT DETECTED Final   Enteroaggregative E coli (EAEC) NOT DETECTED NOT DETECTED Final   Enteropathogenic E coli (EPEC) NOT DETECTED NOT DETECTED Final   Enterotoxigenic E coli (ETEC) NOT DETECTED NOT DETECTED Final   Shiga like toxin producing E coli (STEC) NOT DETECTED NOT DETECTED Final   Shigella/Enteroinvasive E coli (EIEC) NOT DETECTED NOT DETECTED Final   Cryptosporidium NOT DETECTED NOT DETECTED Final   Cyclospora cayetanensis NOT DETECTED NOT DETECTED Final   Entamoeba histolytica NOT DETECTED NOT DETECTED Final   Giardia lamblia NOT DETECTED NOT DETECTED Final   Adenovirus F40/41 NOT DETECTED NOT DETECTED Final   Astrovirus NOT DETECTED NOT DETECTED Final   Norovirus GI/GII NOT DETECTED NOT DETECTED Final   Rotavirus A NOT DETECTED NOT DETECTED Final   Sapovirus (I, II, IV, and V) NOT DETECTED  NOT DETECTED Final    Comment: Performed at [facility], [address], [place], [place] [address]         Radiology Studies: No results found.      Scheduled Meds: . estradiol  1 mg Oral Daily  . folic acid  2 mg Oral Daily  . gabapentin  300 mg Oral Daily   And  . gabapentin  600 mg Oral QHS  . irbesartan  37.5 mg Oral Daily  . loperamide  4 mg Oral TID  . pantoprazole  40 mg Oral Daily  . saccharomyces boulardii  250 mg Oral BID  . sodium chloride flush  3 mL Intravenous Q12H  . vitamin B-12  1,000 mcg Oral Daily   Continuous Infusions:    LOS: 8 days   Georgette Shell, MD  04/02/2020, 2:25 PM

## 2020-04-03 ENCOUNTER — Inpatient Hospital Stay (HOSPITAL_COMMUNITY): Payer: Medicare Other

## 2020-04-03 DIAGNOSIS — K559 Vascular disorder of intestine, unspecified: Secondary | ICD-10-CM | POA: Diagnosis not present

## 2020-04-03 DIAGNOSIS — R509 Fever, unspecified: Secondary | ICD-10-CM

## 2020-04-03 DIAGNOSIS — I10 Essential (primary) hypertension: Secondary | ICD-10-CM | POA: Diagnosis not present

## 2020-04-03 LAB — COMPREHENSIVE METABOLIC PANEL
ALT: 30 U/L (ref 0–44)
AST: 37 U/L (ref 15–41)
Albumin: 2.6 g/dL — ABNORMAL LOW (ref 3.5–5.0)
Alkaline Phosphatase: 82 U/L (ref 38–126)
Anion gap: 10 (ref 5–15)
BUN: 5 mg/dL — ABNORMAL LOW (ref 8–23)
CO2: 17 mmol/L — ABNORMAL LOW (ref 22–32)
Calcium: 8.2 mg/dL — ABNORMAL LOW (ref 8.9–10.3)
Chloride: 110 mmol/L (ref 98–111)
Creatinine, Ser: 0.79 mg/dL (ref 0.44–1.00)
GFR calc Af Amer: >60 mL/min (ref >60)
GFR calc non Af Amer: >60 mL/min (ref >60)
Glucose, Bld: 93 mg/dL (ref 70–99)
Potassium: 3.6 mmol/L (ref 3.5–5.1)
Sodium: 137 mmol/L (ref 135–145)
Total Bilirubin: 0.2 mg/dL — ABNORMAL LOW (ref 0.3–1.2)
Total Protein: 5.2 g/dL — ABNORMAL LOW (ref 6.5–8.1)

## 2020-04-03 LAB — CBC
HCT: 30.4 % — ABNORMAL LOW (ref 36.0–46.0)
Hemoglobin: 9.9 g/dL — ABNORMAL LOW (ref 12.0–15.0)
MCH: 35.9 pg — ABNORMAL HIGH (ref 26.0–34.0)
MCHC: 32.6 g/dL (ref 30.0–36.0)
MCV: 110.1 fL — ABNORMAL HIGH (ref 80.0–100.0)
Platelets: 95 10*3/uL — ABNORMAL LOW (ref 150–400)
RBC: 2.76 MIL/uL — ABNORMAL LOW (ref 3.87–5.11)
RDW: 16.2 % — ABNORMAL HIGH (ref 11.5–15.5)
WBC: 5.3 10*3/uL (ref 4.0–10.5)
nRBC: 0 % (ref 0.0–0.2)

## 2020-04-03 LAB — GLUCOSE, CAPILLARY
Glucose-Capillary: 73 mg/dL (ref 70–99)
Glucose-Capillary: 120 mg/dL — ABNORMAL HIGH (ref 70–99)
Glucose-Capillary: 165 mg/dL — ABNORMAL HIGH (ref 70–99)

## 2020-04-03 MED ORDER — LOPERAMIDE HCL 2 MG PO CAPS: 4.0000 mg | ORAL_CAPSULE | Freq: Three times a day (TID) | ORAL | Status: DC | PRN

## 2020-04-03 MED ORDER — PREDNISONE 5 MG PO TABS
10.0000 mg | ORAL_TABLET | Freq: Every day | ORAL | Status: DC
  Administered 2020-04-03 – 2020-04-05 (×3): 10 mg via ORAL
  Filled 2020-04-03 (×3): qty 2

## 2020-04-03 NOTE — Progress Notes (Signed)
MEWS green.    04/03/20 0112  Assess: MEWS Score  Temp 99.4 F (37.4 C)  BP 110/76  Pulse Rate (!) 107  Resp 16  SpO2 94 %  O2 Device Room Air  Assess: MEWS Score  MEWS Temp 0  MEWS Systolic 0  MEWS Pulse 1  MEWS RR 0  MEWS LOC 0  MEWS Score 1  MEWS Score Color Green

## 2020-04-03 NOTE — Progress Notes (Signed)
°   04/02/20 2310  Assess: MEWS Score  Temp (!) 102.7 F (39.3 C)  Assess: MEWS Score  MEWS Temp 2  MEWS Systolic 0  MEWS Pulse 1  MEWS RR 0  MEWS LOC 0  MEWS Score 3  MEWS Score Color Yellow   MEWS continues Yellow 3. Continue to monitor.

## 2020-04-03 NOTE — Progress Notes (Signed)
Occupational Therapy Treatment Patient Details Name: Helen Hardy MRN: [id number] DOB: 05-29-1951 Today's Date: 04/03/2020    History of present illness 69 y.o. female with medical history significant of non-Hodgkin's lymphoma (dx in 2004) in remission, rheumatoid arthritis since age 28, chronic pain, hypertension, GERD, osteoarthritis, lumbar radiculopathy, morbid obesity, anemia of chronic disease and admitted for abdominal pain with some rectal bleeding with bowel movements   OT comments  Patient demonstrating progress towards goals. Today patient able to stand at sink and perform grooming task. Ambulation limited to near chair or beside bed due to patient reporting her left knee "buckling." Use of RW and verbal cues for technique with walker management and hand placement with sit to stand transfers. Cont POC    Follow Up Recommendations  Home health OT;Supervision/Assistance - 24 hour    Equipment Recommendations       Recommendations for Other Services      Precautions / Restrictions Precautions Precautions: Fall Restrictions Weight Bearing Restrictions: No       Mobility Bed Mobility               General bed mobility comments: Patient seated in recliner.  Transfers Overall transfer level: Needs assistance Equipment used: Rolling walker (2 wheeled) Transfers: Sit to/from Stand Sit to Stand: Min guard Stand pivot transfers: Min guard       General transfer comment: verbal cues for technique.    Balance           Standing balance support: Bilateral upper extremity supported;During functional activity Standing balance-Leahy Scale: Poor                             ADL either performed or assessed with clinical judgement   ADL       Grooming: Standing;Min guard;Wash/dry face;Wash/dry Nurse, mental health Details (indicate cue type and reason): Patient stood at sink to wash hands and face. Patient bent over sink with weight through elbows  during task.                                     Vision Patient Visual Report: No change from baseline     Perception     Praxis      Cognition Arousal/Alertness: Awake/alert Behavior During Therapy: WFL for tasks assessed/performed Overall Cognitive Status: Within Functional Limits for tasks assessed                                          Exercises     Shoulder Instructions       General Comments      Pertinent Vitals/ Pain       Pain Assessment: No/denies pain  Home Living                                          Prior Functioning/Environment              Frequency  Min 2X/week        Progress Toward Goals  OT Goals(current goals can now be found in the care plan section)  Progress towards OT goals: Progressing toward goals  Acute Rehab OT Goals Patient Stated Goal: get  well OT Goal Formulation: With patient Time For Goal Achievement: 04/10/20 Potential to Achieve Goals: Good  Plan Discharge plan remains appropriate    Co-evaluation                 AM-PAC OT "6 Clicks" Daily Activity     Outcome Measure   Help from another person eating meals?: None   Help from another person toileting, which includes using toliet, bedpan, or urinal?: A Little Help from another person bathing (including washing, rinsing, drying)?: A Lot Help from another person to put on and taking off regular upper body clothing?: None Help from another person to put on and taking off regular lower body clothing?: A Lot 6 Click Score: 15    End of Session Equipment Utilized During Treatment: Rolling walker;Gait belt  OT Visit Diagnosis: Unsteadiness on feet (R26.81);Muscle weakness (generalized) (M62.81)   Activity Tolerance Patient tolerated treatment well   Patient Left in chair;with call bell/phone within reach;with family/visitor present;with chair alarm set   Nurse Communication Mobility status         Time: [id number] OT Time Calculation (min): 20 min  Charges: OT General Charges $OT Visit: 1 Visit OT Treatments $Self Care/Home Management : 8-22 mins  Derl Barrow, OTR/L [place]  Office [phone] Pager: [place] 04/03/2020, 4:38 PM

## 2020-04-03 NOTE — Progress Notes (Signed)
PROGRESS NOTE    Helen Hardy  UXN:[id number] DOB: 06-Aug-1950 DOA: 03/25/2020 PCP: Deland Pretty, MD    Brief Narrative: 69 year old female with past medical history of diverticulitis, rheumatoid arthritis, recent enteropathogenic E. coli GI infection in June, hypertension, chronic radicular lumbar pain, morbid obesity, non-Hodgkin's lymphoma (now in remission) who was brought in from home by EMS with complaints of constant moderate lower abdominal pain  with bright red blood per rectum.  She was admitted with possible diagnosis of acute diverticulitis versus infectious colitis.  She continued to have diarrhea.  She had a colonoscopy 04/01/2020 which was consistent with ischemic colitis a biopsy was taken.  Biopsies pending at this time patient continues with diarrhea.  She was initially treated with Cipro and Flagyl which has been stopped.  Her stool culture negative.  Lactoferrin positive.  Assessment & Plan:  Ischemic colitis Status post colonoscopy 04/01/2020 with findings of the same.   Patient's diet was advanced to soft yesterday.  Seems to be tolerating it well.  Diarrhea appears to be slowing down. Patient was previously treated with Cipro and Flagyl.  She did have fever at the time of admission with tachypnea and tachycardia.  She met sepsis criteria on admission.  Antibiotics were stopped after 4 days. Her lactoferrin was noted to be positive which was thought to be secondary to inflammation CT abdomen and pelvis 03/25/2020 with segmental wall thickening and fat stranding of the transverse colon as well as the base of the cecum consistent with infectious versus inflammatory versus ischemic colitis however favoring Crohn's disease.  Seen by physical therapy.  Home health versus skilled nursing facility.  Patient wants to go home.  Fever Patient noted to have developed fever overnight.  Patient denies any new symptoms.  Her WBC is normal this morning.  Will repeat blood cultures.   Order UA and chest x-ray.  If she again spikes temperatures then we will initiate antibacterials.  Chest x-ray without any active disease.  Microcytic anemia Anemia panel showed ferritin of 1684, TIBC 166, iron 15.  Hemoglobin has been stable.  History of rheumatoid arthritis Patient's prednisone was kept on hold initially due to sepsis.  Patient has been taking prednisone for many years.  We will resume it to avoid adrenal insufficiency.   Essential hypertension Initially hypotensive on presentation.  Stable.  Hypokalemia Stable.  Thrombocytopenia Reason for this is not entirely clear.  Has been stable.  Morbid obesity Estimated body mass index is 45.6 kg/m as calculated from the following:   Height as of this encounter: 5' (1.524 m).   Weight as of this encounter: 105.9 kg.  Sacral and buttock pressure ulcers, stage II Examined.  No signs of infection.  Overall skin noted.  Continue local care.  DVT prophylaxis: SCD due to bleeding per rectum  Code Status: Full code Family Communication: None at bedside  Disposition Plan: Patient wants to go home at discharge.  Continue PT and OT.  Status is: Inpatient  Dispo: The patient is from:home              Anticipated d/c is to: Home versus SNF              Anticipated d/c date is: More than 2 days              Patient currently is not medically stable to d/c.     Consultants:   GI  Procedures: None  Antimicrobials- Anti-infectives (From admission, onward)   Start  Dose/Rate Route Frequency Ordered Stop   03/25/20 2200  ciprofloxacin (CIPRO) IVPB 400 mg  Status:  Discontinued        400 mg 200 mL/hr over 60 Minutes Intravenous Every 12 hours 03/25/20 1142 03/29/20 1503   03/25/20 1600  metroNIDAZOLE (FLAGYL) IVPB 500 mg  Status:  Discontinued        500 mg 100 mL/hr over 60 Minutes Intravenous Every 8 hours 03/25/20 1125 03/28/20 1509   03/25/20 1000  ciprofloxacin (CIPRO) IVPB 400 mg        400 mg 200 mL/hr over 60  Minutes Intravenous  Once 03/25/20 0952 03/25/20 1122   03/25/20 0715  cefTRIAXone (ROCEPHIN) 2 g in sodium chloride 0.9 % 100 mL IVPB       "And" Linked Group Details   2 g 200 mL/hr over 30 Minutes Intravenous  Once 03/25/20 0708 03/25/20 0800   03/25/20 0715  metroNIDAZOLE (FLAGYL) IVPB 500 mg       "And" Linked Group Details   500 mg 100 mL/hr over 60 Minutes Intravenous  Once 03/25/20 0708 03/25/20 0936      Subjective: Patient states that she is feeling slightly better with respect to her diarrhea.  Denies any abdominal pain nausea vomiting.  Tolerating her soft diet.  Concerned about the fever that she had overnight.  Objective: Vitals:   04/02/20 2353 04/03/20 0112 04/03/20 0400 04/03/20 0700  BP: 115/76 110/76 (!) 145/88 (!) 146/73  Pulse: (!) 110 (!) 107 92 92  Resp: 17 16 16 16   Temp: (!) 101.9 F (38.8 C) 99.4 F (37.4 C) 98.9 F (37.2 C) 98.6 F (37 C)  TempSrc: Oral Oral Oral Oral  SpO2: 92% 94% 96% 98%  Weight:      Height:        Intake/Output Summary (Last 24 hours) at 04/03/2020 1326 Last data filed at 04/03/2020 1040 Gross per 24 hour  Intake 440 ml  Output 915 ml  Net -475 ml   Filed Weights   03/25/20 1325 04/01/20 1351  Weight: 105.9 kg 105.9 kg   General appearance: Awake alert.  In no distress Resp: Clear to auscultation bilaterally.  Normal effort Cardio: S1-S2 is normal regular.  No S3-S4.  No rubs murmurs or bruit GI: Abdomen is soft.  Nontender nondistended.  Bowel sounds are present normal.  No masses organomegaly Extremities: No edema.  Full range of motion of lower extremities. Neurologic: Alert and oriented x3.  No focal neurological deficits.    Data Reviewed: I have personally reviewed following labs and imaging studies  CBC: Recent Labs  Lab 03/28/20 0606 03/31/20 1120 04/02/20 0549 04/03/20 0539  WBC 3.7* 4.3 4.7 5.3  HGB 8.8* 8.4* 8.6* 9.9*  HCT 27.9* 25.9* 27.2* 30.4*  MCV 115.3* 110.7* 112.4* 110.1*  PLT 103* 73* 84*  95*   Basic Metabolic Panel: Recent Labs  Lab 03/28/20 0606 03/31/20 1120 04/02/20 0549 04/03/20 0539  NA 140 137 136 137  K 3.9 3.4* 3.3* 3.6  CL 108 109 111 110  CO2 22 17* 14* 17*  GLUCOSE 87 65* 53* 93  BUN 9 <5* <5* 5*  CREATININE 1.10* 0.87 0.79 0.79  CALCIUM 8.2* 7.7* 7.7* 8.2*  MG  --   --  1.8  --    GFR: Estimated Creatinine Clearance: 72 mL/min (by C-G formula based on SCr of 0.79 mg/dL). Liver Function Tests: Recent Labs  Lab 03/28/20 0606 03/31/20 1120 04/02/20 0549 04/03/20 0539  AST 29 83* 47* 37  ALT 20 46* 35 30  ALKPHOS 54 65 68 82  BILITOT 0.8 0.8 0.9 0.2*  PROT 5.2* 4.9* 5.0* 5.2*  ALBUMIN 2.7* 2.3* 2.4* 2.6*   CBG: Recent Labs  Lab 04/02/20 1426 04/02/20 1619 04/02/20 1826 04/02/20 2219 04/03/20 0753  GLUCAP 74 69* 83 94 73     Recent Results (from the past 240 hour(s))  Culture, blood (routine x 2)     Status: None   Collection Time: 03/25/20  6:10 AM   Specimen: BLOOD RIGHT HAND  Result Value Ref Range Status   Specimen Description   Final    BLOOD RIGHT HAND Performed at [facility], [place] [address], [place], [place] [address]    Special Requests   Final    BOTTLES DRAWN AEROBIC AND ANAEROBIC Blood Culture adequate volume Performed at [place] [address], [place], [place] [address]    Culture   Final    NO GROWTH 5 DAYS Performed at [facility], [place] [address] [place], [place] [address]    Report Status 03/30/2020 FINAL  Final  Culture, blood (routine x 2)     Status: None   Collection Time: 03/25/20  6:20 AM   Specimen: BLOOD  Result Value Ref Range Status   Specimen Description   Final    BLOOD RIGHT ANTECUBITAL Performed at [place] [address], [place], [place] [address]    Special Requests   Final    BOTTLES DRAWN AEROBIC AND ANAEROBIC Blood Culture adequate volume Performed at [place] [address]  [place], [place] [address]    Culture   Final    NO GROWTH 5 DAYS Performed at [facility], [place] [address] [place], [place] [address]    Report Status 03/30/2020 FINAL  Final  SARS Coronavirus 2 by RT PCR (hospital order, performed in [facility] hospital lab) Nasopharyngeal Nasopharyngeal Swab     Status: None   Collection Time: 03/25/20  6:22 AM   Specimen: Nasopharyngeal Swab  Result Value Ref Range Status   SARS Coronavirus 2 NEGATIVE NEGATIVE Final    Comment: (NOTE) SARS-CoV-2 target nucleic acids are NOT DETECTED.  The SARS-CoV-2 RNA is generally detectable in upper and lower respiratory specimens during the acute phase of infection. The lowest concentration of SARS-CoV-2 viral copies this assay can detect is 250 copies / mL. A negative result does not preclude SARS-CoV-2 infection and should not be used as the sole basis for treatment or other patient management decisions.  A negative result may occur with improper specimen collection / handling, submission of specimen other than nasopharyngeal swab, presence of viral mutation(s) within the areas targeted by this assay, and inadequate number of viral copies (<250 copies / mL). A negative result must be combined with clinical observations, patient history, and epidemiological information.  Fact Sheet for Patients:   StrictlyIdeas.no  Fact Sheet for Healthcare Providers: BankingDealers.co.za  This test is not yet approved or  cleared by the [place] FDA and has been authorized for detection and/or diagnosis of SARS-CoV-2 by FDA under an Emergency Use Authorization (EUA).  This EUA will remain in effect (meaning this test can be used) for the duration of the COVID-19 declaration under Section 564(b)(1) of the Act, 21 U.S.C. section 360bbb-3(b)(1), unless the authorization is terminated or revoked sooner.  Performed at [facility], [place] [address] [place],  [address]   Gastrointestinal Panel by PCR ,  Stool     Status: None   Collection Time: 03/28/20 10:20 AM   Specimen: Stool  Result Value Ref Range Status   Campylobacter species NOT DETECTED NOT DETECTED Final   Plesimonas shigelloides NOT DETECTED NOT DETECTED Final   Salmonella species NOT DETECTED NOT DETECTED Final   Yersinia enterocolitica NOT DETECTED NOT DETECTED Final   Vibrio species NOT DETECTED NOT DETECTED Final   Vibrio cholerae NOT DETECTED NOT DETECTED Final   Enteroaggregative E coli (EAEC) NOT DETECTED NOT DETECTED Final   Enteropathogenic E coli (EPEC) NOT DETECTED NOT DETECTED Final   Enterotoxigenic E coli (ETEC) NOT DETECTED NOT DETECTED Final   Shiga like toxin producing E coli (STEC) NOT DETECTED NOT DETECTED Final   Shigella/Enteroinvasive E coli (EIEC) NOT DETECTED NOT DETECTED Final   Cryptosporidium NOT DETECTED NOT DETECTED Final   Cyclospora cayetanensis NOT DETECTED NOT DETECTED Final   Entamoeba histolytica NOT DETECTED NOT DETECTED Final   Giardia lamblia NOT DETECTED NOT DETECTED Final   Adenovirus F40/41 NOT DETECTED NOT DETECTED Final   Astrovirus NOT DETECTED NOT DETECTED Final   Norovirus GI/GII NOT DETECTED NOT DETECTED Final   Rotavirus A NOT DETECTED NOT DETECTED Final   Sapovirus (I, II, IV, and V) NOT DETECTED NOT DETECTED Final    Comment: Performed at [facility], [address] [place], [place] [address]         Radiology Studies: DG CHEST PORT 1 VIEW  Result Date: 04/03/2020 CLINICAL DATA:  Fever. EXAM: PORTABLE CHEST 1 VIEW COMPARISON:  March 25, 2020. FINDINGS: Stable cardiomediastinal silhouette. Both lungs are clear. The visualized skeletal structures are unremarkable. IMPRESSION: No active disease. Electronically Signed   By: Marijo Conception M.D.   On: 04/03/2020 09:28        Scheduled Meds: . estradiol  1 mg Oral Daily  . folic acid  2 mg Oral Daily  . gabapentin  300 mg Oral Daily    And  . gabapentin  600 mg Oral QHS  . irbesartan  37.5 mg Oral Daily  . loperamide  4 mg Oral TID  . pantoprazole  40 mg Oral Daily  . predniSONE  10 mg Oral Daily  . saccharomyces boulardii  250 mg Oral BID  . sodium chloride flush  3 mL Intravenous Q12H  . vitamin B-12  1,000 mcg Oral Daily   Continuous Infusions:    LOS: 9 days   Bonnielee Haff, MD  04/03/2020, 1:26 PM

## 2020-04-03 NOTE — Progress Notes (Signed)
MEWS 3. Temperature reduced. Continue to monitor interventions.    04/02/20 2353  Assess: MEWS Score  Temp (!) 101.9 F (38.8 C)  BP 115/76  Pulse Rate (!) 110  Resp 17  SpO2 92 %  O2 Device Room Air  Assess: MEWS Score  MEWS Temp 2  MEWS Systolic 0  MEWS Pulse 1  MEWS RR 0  MEWS LOC 0  MEWS Score 3  MEWS Score Color Yellow

## 2020-04-03 NOTE — TOC Progression Note (Signed)
Transition of Care [facility]) - Progression Note    Patient Details  Name: Helen Hardy MRN: [id number] Date of Birth: [birth year]  Transition of Care [facility]) CM/SW Contact  Isidro Monks, Marjie Skiff, RN Phone Number: 04/03/2020, 2:37 PM  Clinical Narrative:    This CM spoke with pt and husband at bedside for dc planning. Husband would still like to take pt home and he will care for her. Pt continues to want to have home health services at dc. Will need MD orders for HHPT/OT/RN/Aide/CSW. Pt plans to transport home via car.   Expected Discharge Plan: [place] Barriers to Discharge: Continued Medical Work up  Expected Discharge Plan and Services Expected Discharge Plan: [place]   Discharge Planning Services: CM Consult   Living arrangements for the past 2 months: Single Family Home                    Date [place]: 03/28/20 Time [place]: 1210 Representative spoke with at [place]: [place] ([place]) Interventions    Readmission Risk Interventions No flowsheet data found.

## 2020-04-04 DIAGNOSIS — I10 Essential (primary) hypertension: Secondary | ICD-10-CM | POA: Diagnosis not present

## 2020-04-04 DIAGNOSIS — R509 Fever, unspecified: Secondary | ICD-10-CM | POA: Diagnosis not present

## 2020-04-04 DIAGNOSIS — K559 Vascular disorder of intestine, unspecified: Secondary | ICD-10-CM | POA: Diagnosis not present

## 2020-04-04 LAB — COMPREHENSIVE METABOLIC PANEL
ALT: 29 U/L (ref 0–44)
AST: 35 U/L (ref 15–41)
Albumin: 2.6 g/dL — ABNORMAL LOW (ref 3.5–5.0)
Alkaline Phosphatase: 68 U/L (ref 38–126)
Anion gap: 8 (ref 5–15)
BUN: 8 mg/dL (ref 8–23)
CO2: 21 mmol/L — ABNORMAL LOW (ref 22–32)
Calcium: 8.8 mg/dL — ABNORMAL LOW (ref 8.9–10.3)
Chloride: 111 mmol/L (ref 98–111)
Creatinine, Ser: 0.8 mg/dL (ref 0.44–1.00)
GFR calc Af Amer: >60 mL/min (ref >60)
GFR calc non Af Amer: >60 mL/min (ref >60)
Glucose, Bld: 116 mg/dL — ABNORMAL HIGH (ref 70–99)
Potassium: 3.6 mmol/L (ref 3.5–5.1)
Sodium: 140 mmol/L (ref 135–145)
Total Bilirubin: 0.6 mg/dL (ref 0.3–1.2)
Total Protein: 5.3 g/dL — ABNORMAL LOW (ref 6.5–8.1)

## 2020-04-04 LAB — GLUCOSE, CAPILLARY
Glucose-Capillary: 106 mg/dL — ABNORMAL HIGH (ref 70–99)
Glucose-Capillary: 119 mg/dL — ABNORMAL HIGH (ref 70–99)
Glucose-Capillary: 144 mg/dL — ABNORMAL HIGH (ref 70–99)
Glucose-Capillary: 153 mg/dL — ABNORMAL HIGH (ref 70–99)

## 2020-04-04 LAB — CBC
HCT: 26.3 % — ABNORMAL LOW (ref 36.0–46.0)
Hemoglobin: 8.6 g/dL — ABNORMAL LOW (ref 12.0–15.0)
MCH: 36 pg — ABNORMAL HIGH (ref 26.0–34.0)
MCHC: 32.7 g/dL (ref 30.0–36.0)
MCV: 110 fL — ABNORMAL HIGH (ref 80.0–100.0)
Platelets: 97 10*3/uL — ABNORMAL LOW (ref 150–400)
RBC: 2.39 MIL/uL — ABNORMAL LOW (ref 3.87–5.11)
RDW: 16.2 % — ABNORMAL HIGH (ref 11.5–15.5)
WBC: 3.7 10*3/uL — ABNORMAL LOW (ref 4.0–10.5)
nRBC: 0 % (ref 0.0–0.2)

## 2020-04-04 LAB — URINALYSIS, ROUTINE W REFLEX MICROSCOPIC
Bilirubin Urine: NEGATIVE
Glucose, UA: NEGATIVE mg/dL
Hgb urine dipstick: NEGATIVE
Ketones, ur: 20 mg/dL — AB
Leukocytes,Ua: NEGATIVE
Nitrite: NEGATIVE
Protein, ur: NEGATIVE mg/dL
Specific Gravity, Urine: 1.005 (ref 1.005–1.030)
pH: 6 (ref 5.0–8.0)

## 2020-04-04 MED ORDER — POTASSIUM CHLORIDE CRYS ER 20 MEQ PO TBCR
40.0000 meq | EXTENDED_RELEASE_TABLET | Freq: Once | ORAL | Status: AC
  Administered 2020-04-04: 40 meq via ORAL
  Filled 2020-04-04: qty 2

## 2020-04-04 NOTE — Progress Notes (Addendum)
PROGRESS NOTE    Helen Hardy  NTZ:[id number] DOB: 1951-01-17 DOA: 03/25/2020 PCP: Deland Pretty, MD    Brief Narrative: 69 year old female with past medical history of diverticulitis, rheumatoid arthritis, recent enteropathogenic E. coli GI infection in June, hypertension, chronic radicular lumbar pain, morbid obesity, non-Hodgkin's lymphoma (now in remission) who was brought in from home by EMS with complaints of constant moderate lower abdominal pain  with bright red blood per rectum.  She was admitted with possible diagnosis of acute diverticulitis versus infectious colitis.  She continued to have diarrhea.  She had a colonoscopy 04/01/2020 which was consistent with ischemic colitis a biopsy was taken.  Biopsies pending at this time patient continues with diarrhea.  She was initially treated with Cipro and Flagyl which has been stopped.  Her stool culture negative.  Lactoferrin positive.  Assessment & Plan:  Ischemic colitis Status post colonoscopy 04/01/2020 with findings of the same.   Patient was previously treated with Cipro and Flagyl.  She did have fever at the time of admission with tachypnea and tachycardia.  She met sepsis criteria on admission.  Antibiotics were stopped after 4 days. Her lactoferrin was noted to be positive which was thought to be secondary to inflammation CT abdomen and pelvis 03/25/2020 with segmental wall thickening and fat stranding of the transverse colon as well as the base of the cecum consistent with infectious versus inflammatory versus ischemic colitis however favoring Crohn's disease.  Patient's diet was advanced to soft.  She does feel better.  Diarrhea appears to be slowing down. Seen by PT and OT.  Home health is recommended.  Patient lives with her husband.  Would like for patient to walk in the hallway with therapy.  Fever Patient noted to have fever with temperatures as high as 102 F on 9/28-29.  Her WBC was normal.  Chest x-ray was clear.  UA  did not suggest infection.  Blood cultures have been drawn.  Has not had any fever in the last 24 hours.  Reason for her fever not entirely clear.  Will wait another 24 hours to see if blood cultures show anything or if she has another episode of fever.  Continue to hold off on antibiotics at this time.    Microcytic anemia Anemia panel showed ferritin of 1684, TIBC 166, iron 15.  Hemoglobin has been stable.  History of rheumatoid arthritis Patient's prednisone was kept on hold initially due to sepsis.  Patient has been taking prednisone for many years.  Prednisone was resumed yesterday.  Essential hypertension Initially hypotensive on presentation.  Stable.  Hypokalemia Potassium 3.6 today.  Will give additional dose of potassium..  Thrombocytopenia Reason for this is not entirely clear.  Has been stable.  Morbid obesity Estimated body mass index is 45.6 kg/m as calculated from the following:   Height as of this encounter: 5' (1.524 m).   Weight as of this encounter: 105.9 kg.  Sacral and buttock pressure ulcers, stage II No signs of infection.  Continue local care.  DVT prophylaxis: SCD  Code Status: Full code Family Communication: None at bedside  Disposition Plan: Patient wants to go home at discharge.  Continue PT and OT.  Anticipate discharge tomorrow if she remains afebrile and after patient has walked in the hallway.  Status is: Inpatient  Dispo: The patient is from:home              Anticipated d/c is to: Home with home health  Anticipated d/c date is: 10/1              Patient currently is not medically stable to d/c.     Consultants:   GI  Procedures: None  Antimicrobials- Anti-infectives (From admission, onward)   Start     Dose/Rate Route Frequency Ordered Stop   03/25/20 2200  ciprofloxacin (CIPRO) IVPB 400 mg  Status:  Discontinued        400 mg 200 mL/hr over 60 Minutes Intravenous Every 12 hours 03/25/20 1142 03/29/20 1503   03/25/20 1600   metroNIDAZOLE (FLAGYL) IVPB 500 mg  Status:  Discontinued        500 mg 100 mL/hr over 60 Minutes Intravenous Every 8 hours 03/25/20 1125 03/28/20 1509   03/25/20 1000  ciprofloxacin (CIPRO) IVPB 400 mg        400 mg 200 mL/hr over 60 Minutes Intravenous  Once 03/25/20 0952 03/25/20 1122   03/25/20 0715  cefTRIAXone (ROCEPHIN) 2 g in sodium chloride 0.9 % 100 mL IVPB       "And" Linked Group Details   2 g 200 mL/hr over 30 Minutes Intravenous  Once 03/25/20 0708 03/25/20 0800   03/25/20 0715  metroNIDAZOLE (FLAGYL) IVPB 500 mg       "And" Linked Group Details   500 mg 100 mL/hr over 60 Minutes Intravenous  Once 03/25/20 0708 03/25/20 0936      Subjective: Patient states that she is feeling better.  Still fatigued.  Would like to hall walk in the hallway first before considering going home.  Denies any further episodes of fever.  Diarrhea slowing down.  Denies any abdominal pain.    Objective: Vitals:   04/03/20 0400 04/03/20 0700 04/03/20 1327 04/04/20 0610  BP: (!) 145/88 (!) 146/73 (!) 141/75 (!) 129/95  Pulse: 92 92 92 87  Resp: 16 16 14 17   Temp: 98.9 F (37.2 C) 98.6 F (37 C) 98.3 F (36.8 C) 97.9 F (36.6 C)  TempSrc: Oral Oral Oral Oral  SpO2: 96% 98% 100% 100%  Weight:      Height:        Intake/Output Summary (Last 24 hours) at 04/04/2020 1030 Last data filed at 04/04/2020 0600 Gross per 24 hour  Intake 960 ml  Output 1250 ml  Net -290 ml   Filed Weights   03/25/20 1325 04/01/20 1351  Weight: 105.9 kg 105.9 kg    General appearance: Awake alert.  In no distress Resp: Clear to auscultation bilaterally.  Normal effort Cardio: S1-S2 is normal regular.  No S3-S4.  No rubs murmurs or bruit GI: Abdomen is soft.  Nontender nondistended.  Bowel sounds are present normal.  No masses organomegaly Extremities: No edema.  Moving all her extremities Skin breakdown noted in the back over the sacral area as well as the gluteal regions bilaterally.  Stage  II. Neurologic:   No focal neurological deficits.     Data Reviewed: I have personally reviewed following labs and imaging studies  CBC: Recent Labs  Lab 03/31/20 1120 04/02/20 0549 04/03/20 0539 04/04/20 0541  WBC 4.3 4.7 5.3 3.7*  HGB 8.4* 8.6* 9.9* 8.6*  HCT 25.9* 27.2* 30.4* 26.3*  MCV 110.7* 112.4* 110.1* 110.0*  PLT 73* 84* 95* 97*   Basic Metabolic Panel: Recent Labs  Lab 03/31/20 1120 04/02/20 0549 04/03/20 0539 04/04/20 0541  NA 137 136 137 140  K 3.4* 3.3* 3.6 3.6  CL 109 111 110 111  CO2 17* 14* 17* 21*  GLUCOSE  65* 53* 93 116*  BUN <5* <5* 5* 8  CREATININE 0.87 0.79 0.79 0.80  CALCIUM 7.7* 7.7* 8.2* 8.8*  MG  --  1.8  --   --    GFR: Estimated Creatinine Clearance: 72 mL/min (by C-G formula based on SCr of 0.8 mg/dL). Liver Function Tests: Recent Labs  Lab 03/31/20 1120 04/02/20 0549 04/03/20 0539 04/04/20 0541  AST 83* 47* 37 35  ALT 46* 35 30 29  ALKPHOS 65 68 82 68  BILITOT 0.8 0.9 0.2* 0.6  PROT 4.9* 5.0* 5.2* 5.3*  ALBUMIN 2.3* 2.4* 2.6* 2.6*   CBG: Recent Labs  Lab 04/02/20 2219 04/03/20 0753 04/03/20 1351 04/03/20 1721 04/04/20 0735  GLUCAP 94 73 120* 165* 106*     Recent Results (from the past 240 hour(s))  Gastrointestinal Panel by PCR , Stool     Status: None   Collection Time: 03/28/20 10:20 AM   Specimen: Stool  Result Value Ref Range Status   Campylobacter species NOT DETECTED NOT DETECTED Final   Plesimonas shigelloides NOT DETECTED NOT DETECTED Final   Salmonella species NOT DETECTED NOT DETECTED Final   Yersinia enterocolitica NOT DETECTED NOT DETECTED Final   Vibrio species NOT DETECTED NOT DETECTED Final   Vibrio cholerae NOT DETECTED NOT DETECTED Final   Enteroaggregative E coli (EAEC) NOT DETECTED NOT DETECTED Final   Enteropathogenic E coli (EPEC) NOT DETECTED NOT DETECTED Final   Enterotoxigenic E coli (ETEC) NOT DETECTED NOT DETECTED Final   Shiga like toxin producing E coli (STEC) NOT DETECTED NOT  DETECTED Final   Shigella/Enteroinvasive E coli (EIEC) NOT DETECTED NOT DETECTED Final   Cryptosporidium NOT DETECTED NOT DETECTED Final   Cyclospora cayetanensis NOT DETECTED NOT DETECTED Final   Entamoeba histolytica NOT DETECTED NOT DETECTED Final   Giardia lamblia NOT DETECTED NOT DETECTED Final   Adenovirus F40/41 NOT DETECTED NOT DETECTED Final   Astrovirus NOT DETECTED NOT DETECTED Final   Norovirus GI/GII NOT DETECTED NOT DETECTED Final   Rotavirus A NOT DETECTED NOT DETECTED Final   Sapovirus (I, II, IV, and V) NOT DETECTED NOT DETECTED Final    Comment: Performed at [facility], [place] [place], [place] [address]  Culture, blood (Routine X 2) w Reflex to ID Panel     Status: None (Preliminary result)   Collection Time: 04/03/20  8:58 AM   Specimen: BLOOD  Result Value Ref Range Status   Specimen Description   Final    BLOOD BLOOD LEFT HAND Performed at [facility], [place] [address] [place], [place] [address]    Special Requests   Final    BOTTLES DRAWN AEROBIC ONLY Blood Culture adequate volume Performed at [place] [address], [place], [place] [address]    Culture   Final    NO GROWTH 1 DAY Performed at [facility], [place] [address] [place], [place] [address]    Report Status PENDING  Incomplete  Culture, blood (Routine X 2) w Reflex to ID Panel     Status: None (Preliminary result)   Collection Time: 04/03/20 11:09 AM   Specimen: BLOOD  Result Value Ref Range Status   Specimen Description   Final    BLOOD BLOOD RIGHT FOREARM Performed at [place] [address] [place], [place] [address]    Special Requests   Final    BOTTLES DRAWN AEROBIC AND ANAEROBIC Blood Culture adequate volume Performed at [facility], [address] [place], [place] [address]    Culture   Final    NO GROWTH < 24 HOURS Performed at [place] [address],  [place], [place] [address]    Report Status PENDING  Incomplete         Radiology Studies: DG CHEST PORT 1 VIEW  Result Date: 04/03/2020 CLINICAL DATA:  Fever. EXAM: PORTABLE CHEST 1 VIEW COMPARISON:  March 25, 2020. FINDINGS: Stable cardiomediastinal silhouette. Both lungs are clear. The visualized skeletal structures are unremarkable. IMPRESSION: No active disease. Electronically Signed   By: Marijo Conception M.D.   On: 04/03/2020 09:28        Scheduled Meds: . estradiol  1 mg Oral Daily  . folic acid  2 mg Oral Daily  . gabapentin  300 mg Oral Daily   And  . gabapentin  600 mg Oral QHS  . irbesartan  37.5 mg Oral Daily  . pantoprazole  40 mg Oral Daily  . predniSONE  10 mg Oral Daily  . saccharomyces boulardii  250 mg Oral BID  . sodium chloride flush  3 mL Intravenous Q12H  . vitamin B-12  1,000 mcg Oral Daily   Continuous Infusions:    LOS: 10 days   Bonnielee Haff, MD  04/04/2020, 10:30 AM

## 2020-04-04 NOTE — Progress Notes (Signed)
Physical Therapy Treatment Patient Details Name: Helen Hardy MRN: [id number] DOB: [birth year] Today's Date: 04/04/2020    History of Present Illness 69 y.o. female with medical history significant of non-Hodgkin's lymphoma (dx in 2004) in remission, rheumatoid arthritis since age 48, chronic pain, hypertension, GERD, osteoarthritis, lumbar radiculopathy, morbid obesity, anemia of chronic disease and admitted for abdominal pain with some rectal bleeding with bowel movements    PT Comments    Patient is making steady progress with mobility. She advanced gait training to ~160 feet with RW and min assist to maintain safe walker management. HR elevated during gait to max of 133 bpm and pt required seated rest break at ~80', HR decreased to 100's resting. At EOS HR back in 90's and Sats 100% throughout on RA. She will continue to benefit from skilled PT interventions in acute setting and with HHPT follow up.      Follow Up Recommendations  Home health PT;SNF     Equipment Recommendations  None recommended by PT    Recommendations for Other Services       Precautions / Restrictions Precautions Precautions: Fall Restrictions Weight Bearing Restrictions: No    Mobility  Bed Mobility Overal bed mobility: Needs Assistance Bed Mobility: Supine to Sit     Supine to sit: Min assist;HOB elevated     General bed mobility comments: assist for LE mobility to EOB, cues to use bed rail to raise trunk.   Transfers Overall transfer level: Needs assistance Equipment used: Rolling walker (2 wheeled) Transfers: Sit to/from Stand Sit to Stand: Min guard         General transfer comment: cues for technique, guard for safety with rise and cues for safe reach back to sit.  Ambulation/Gait Ambulation/Gait assistance: Min assist;Min guard Gait Distance (Feet): 160 Feet (seated break at 80') Assistive device: Rolling walker (2 wheeled) Gait Pattern/deviations: Step-through  pattern;Decreased stride length;Trunk flexed;Wide base of support Gait velocity: decr   General Gait Details: intermittent cues for posture throughout. pt with slow gait and wide steps, cues needed and assist to maintain safe proximity to RW.    Stairs             Wheelchair Mobility    Modified Rankin (Stroke Patients Only)       Balance Overall balance assessment: Needs assistance Sitting-balance support: Feet supported Sitting balance-Leahy Scale: Fair     Standing balance support: Bilateral upper extremity supported;During functional activity Standing balance-Leahy Scale: Poor                              Cognition Arousal/Alertness: Awake/alert Behavior During Therapy: WFL for tasks assessed/performed Overall Cognitive Status: Within Functional Limits for tasks assessed                                        Exercises      General Comments        Pertinent Vitals/Pain Pain Assessment: No/denies pain    Home Living                      Prior Function            PT Goals (current goals can now be found in the care plan section) Acute Rehab PT Goals PT Goal Formulation: With patient Time For Goal Achievement: 04/09/20 Potential to Achieve  Goals: Good Progress towards PT goals: Progressing toward goals    Frequency    Min 3X/week      PT Plan Current plan remains appropriate    Co-evaluation              AM-PAC PT "6 Clicks" Mobility   Outcome Measure  Help needed turning from your back to your side while in a flat bed without using bedrails?: A Little Help needed moving from lying on your back to sitting on the side of a flat bed without using bedrails?: A Little Help needed moving to and from a bed to a chair (including a wheelchair)?: A Little Help needed standing up from a chair using your arms (e.g., wheelchair or bedside chair)?: A Little Help needed to walk in hospital room?: A  Little Help needed climbing 3-5 steps with a railing? : A Lot 6 Click Score: 17    End of Session Equipment Utilized During Treatment: Gait belt Activity Tolerance: Patient tolerated treatment well Patient left: in chair;with call bell/phone within reach;with chair alarm set;with family/visitor present Nurse Communication: Mobility status PT Visit Diagnosis: Difficulty in walking, not elsewhere classified (R26.2);Muscle weakness (generalized) (M62.81)     Time: 4784-1282 PT Time Calculation (min) (ACUTE ONLY): 28 min  Charges:  $Gait Training: 23-37 mins                     Verner Mould, DPT Acute Rehabilitation Services  Office [phone] Pager [phone]  04/04/2020 4:51 PM

## 2020-04-04 NOTE — Progress Notes (Signed)
Patient's back side is extremely excoriated due to multiple liquid stools from antibiotics. Pt's buttocks and medial thighs are excoriated with redness. Barrier cream applied after cleaning with warm soap and water.

## 2020-04-05 DIAGNOSIS — K922 Gastrointestinal hemorrhage, unspecified: Secondary | ICD-10-CM | POA: Diagnosis not present

## 2020-04-05 DIAGNOSIS — N179 Acute kidney failure, unspecified: Secondary | ICD-10-CM | POA: Diagnosis not present

## 2020-04-05 LAB — COMPREHENSIVE METABOLIC PANEL
ALT: 36 U/L (ref 0–44)
AST: 51 U/L — ABNORMAL HIGH (ref 15–41)
Albumin: 2.6 g/dL — ABNORMAL LOW (ref 3.5–5.0)
Alkaline Phosphatase: 70 U/L (ref 38–126)
Anion gap: 10 (ref 5–15)
BUN: 9 mg/dL (ref 8–23)
CO2: 23 mmol/L (ref 22–32)
Calcium: 8.8 mg/dL — ABNORMAL LOW (ref 8.9–10.3)
Chloride: 112 mmol/L — ABNORMAL HIGH (ref 98–111)
Creatinine, Ser: 0.77 mg/dL (ref 0.44–1.00)
GFR calc Af Amer: >60 mL/min (ref >60)
GFR calc non Af Amer: >60 mL/min (ref >60)
Glucose, Bld: 113 mg/dL — ABNORMAL HIGH (ref 70–99)
Potassium: 3.8 mmol/L (ref 3.5–5.1)
Sodium: 145 mmol/L (ref 135–145)
Total Bilirubin: 0.9 mg/dL (ref 0.3–1.2)
Total Protein: 5.5 g/dL — ABNORMAL LOW (ref 6.5–8.1)

## 2020-04-05 LAB — CBC
HCT: 24.6 % — ABNORMAL LOW (ref 36.0–46.0)
Hemoglobin: 7.9 g/dL — ABNORMAL LOW (ref 12.0–15.0)
MCH: 35.3 pg — ABNORMAL HIGH (ref 26.0–34.0)
MCHC: 32.1 g/dL (ref 30.0–36.0)
MCV: 109.8 fL — ABNORMAL HIGH (ref 80.0–100.0)
Platelets: 117 10*3/uL — ABNORMAL LOW (ref 150–400)
RBC: 2.24 MIL/uL — ABNORMAL LOW (ref 3.87–5.11)
RDW: 16.4 % — ABNORMAL HIGH (ref 11.5–15.5)
WBC: 3.5 10*3/uL — ABNORMAL LOW (ref 4.0–10.5)
nRBC: 0 % (ref 0.0–0.2)

## 2020-04-05 NOTE — Discharge Summary (Signed)
Physician Discharge Summary  Patient ID: Helen Hardy MRN: [id number] DOB/AGE: 1951/04/08 69 y.o.  Admit date: 03/25/2020 Discharge date: 04/05/2020  Admission Diagnoses:  Discharge Diagnoses:  Principal Problem:   GI bleed Active Problems:   Rheumatoid arthritis ([place])   Essential hypertension   AKI (acute kidney injury) ([place])   SIRS (systemic inflammatory response syndrome) ([place])   Discharged Condition: stable  Hospital Course: 69 year old female with past medical history of diverticulitis, rheumatoid arthritis, recent enteropathogenic E. coli GI infection in June, hypertension, chronic radicular lumbar pain, morbid obesity, non-Hodgkin's lymphoma(now in remission) who was brought in from home by EMS with complaints of constant moderate lower abdominal pain with bright red blood per rectum.  She was admitted with possible diagnosis of acute diverticulitis versus infectious colitis.  She continued to have diarrhea.  She had a colonoscopy 04/01/2020 which was consistent with ischemic colitis a biopsy was taken.  Biopsies pending at this time patient continues with diarrhea.  She was initially treated with Cipro and Flagyl which has been stopped.  Her stool culture negative.  Lactoferrin positive.  Ischemic colitis Status post colonoscopy 04/01/2020 with findings of the same.   Patient was previously treated with Cipro and Flagyl.  She did have fever at the time of admission with tachypnea and tachycardia.  She met sepsis criteria on admission.  Antibiotics were stopped after 4 days. Her lactoferrin was noted to be positive which was thought to be secondary to inflammation CT abdomen and pelvis 03/25/2020 with segmental wall thickening and fat stranding of the transverse colon as well as the base of the cecum consistent with infectious versus inflammatory versus ischemic colitis however favoring Crohn's disease.  Patient's diet was advanced to soft.  She does feel better.  Diarrhea  appears to be slowing down. Seen by PT and OT.  Home health is recommended.  Patient lives with her husband.  Would like for patient to walk in the hallway with therapy.  Fever Patient noted to have fever with temperatures as high as 102 F on 9/28-29.  Her WBC was normal.  Chest x-ray was clear.  UA did not suggest infection.  Blood cultures have been drawn.  Has not had any fever in the last 24 hours.  Reason for her fever not entirely clear.  Will wait another 24 hours to see if blood cultures show anything or if she has another episode of fever.  Continue to hold off on antibiotics at this time.    Microcytic anemia Anemia panel showed ferritin of 1684, TIBC 166, iron 15.  Hemoglobin has been stable.  History of rheumatoid arthritis Patient's prednisone was kept on hold initially due to sepsis.  Patient has been taking prednisone for many years.  Prednisone was resumed yesterday.  Essential hypertension Initially hypotensive on presentation.  Stable.  Hypokalemia Potassium 3.6 today.  Will give additional dose of potassium..  Thrombocytopenia Reason for this is not entirely clear.  Has been stable.  Morbid obesity Estimated body mass index is 45.6 kg/m as calculated from the following:   Height as of this encounter: 5' (1.524 m).   Weight as of this encounter: 105.9 kg.  Sacral and buttock pressure ulcers, stage II No signs of infection.  Continue local care.   Consults:  GI  Significant Diagnostic Studies:     Discharge Exam: Blood pressure (!) 141/85, pulse 93, temperature 97.8 F (36.6 C), temperature source Oral, resp. rate 18, height 5' (1.524 m), weight 105.9 kg, SpO2 100 %.  Disposition: Discharge disposition: 01-Home or Self Care   Discharge Instructions    Diet - low sodium heart healthy   Complete by: As directed    Discharge wound care:   Complete by: As directed    Continue current management   Increase activity slowly   Complete by: As  directed      Allergies as of 04/05/2020      Reactions   Oxycodone Nausea And Vomiting   Tramadol Other (See Comments)   UNKNOWN REACTION      Medication List    STOP taking these medications   furosemide 40 MG tablet Commonly known as: LASIX   Potassium Chloride ER 20 MEQ Tbcr   valACYclovir 1000 MG tablet Commonly known as: VALTREX     TAKE these medications   estradiol 1 MG tablet Commonly known as: ESTRACE Take 1 mg by mouth daily.   folic acid 1 MG tablet Commonly known as: FOLVITE Take 2 mg by mouth daily.   gabapentin 300 MG capsule Commonly known as: NEURONTIN Take 300-600 mg by mouth See admin instructions. 300 mg in the morning and at 2 pm. 600 mg every night   HYDROcodone-acetaminophen 10-325 MG tablet Commonly known as: NORCO Take 1 tablet by mouth every 8 (eight) hours as needed for moderate pain or severe pain.   leflunomide 20 MG tablet Commonly known as: ARAVA Take 20 mg by mouth daily.   methocarbamol 500 MG tablet Commonly known as: ROBAXIN Take 750 mg by mouth in the morning and at bedtime.   multivitamin tablet Take 1 tablet by mouth daily.   olmesartan 20 MG tablet Commonly known as: BENICAR Take 20 mg by mouth daily.   ondansetron 4 MG tablet Commonly known as: ZOFRAN Take 4 mg by mouth every 8 (eight) hours as needed for nausea or vomiting.   pantoprazole 40 MG tablet Commonly known as: PROTONIX Take 40 mg by mouth daily.   predniSONE 10 MG tablet Commonly known as: DELTASONE Take 10 mg by mouth daily.   triamcinolone cream 0.1 % Commonly known as: KENALOG Apply 1 application topically as needed (for rash).   vitamin B-12 1000 MCG tablet Commonly known as: CYANOCOBALAMIN Take 1,000 mcg by mouth daily.   vitamin C 1000 MG tablet Take 1,000 mg by mouth daily.   Vitamin D3 25 MCG (1000 UT) Caps Take 2,000 Units by mouth daily.            Discharge Care Instructions  (From admission, onward)         Start      Ordered   04/05/20 0000  Discharge wound care:       Comments: Continue current management   04/05/20 1034           Signed: Bonnell Public 04/05/2020, 10:35 AM

## 2020-04-05 NOTE — Consult Note (Addendum)
[facility] Nurse Consult Note: Reason for Consult: Consult requested for maceration to buttocks.  Pt is sitting up in the chair and the area is not visualized, but reviewed progress notes and discussed appearance and plan of care with patient and husband at bedside.  Pt has been frequently incontinent of liquid stool and describes buttocks and perineum as red, moist and painful and "burned up" with patchy areas of skin loss; appearance description is consistent with moisture associated skin damage. Pt plans to discharge home today.  Dressing procedure/placement/frequency: Reviewed topical treatment to protect skin, repel moisture, and promote healing as follows: Apply Desitin cream to perineum and buttocks TID and PRN with each turning and cleaning episode, then apply antifungal powder over the cream. Pt should avoid use of diapers or briefs.  She verbalized understanding. Please re-consult if further assistance is needed.  Thank-you,  Julien Girt MSN, [place], [place], [place], [place]

## 2020-04-05 NOTE — Progress Notes (Signed)
Physical Therapy Treatment Patient Details Name: Helen Hardy MRN: [id number] DOB: 19-Mar-1951 Today's Date: 04/05/2020    History of Present Illness 69 y.o. female with medical history significant of non-Hodgkin's lymphoma (dx in 2004) in remission, rheumatoid arthritis since age 13, chronic pain, hypertension, GERD, osteoarthritis, lumbar radiculopathy, morbid obesity, anemia of chronic disease and admitted for abdominal pain with some rectal bleeding with bowel movements    PT Comments    Patient making steady progress with acute PT. Patient received on [facility] and required assist for pericare; pt performed multiple sit<>stands from [facility] due to LE fatigue with prolonged standing. Pt's dressings soiled and transferred to bed to roll and complete cleansing and dressing change for buttock sores. Reviewed repeated sit<>stands for LE strengthening and gait with RW. Pt demonstrated improved foot clearance during ambulation and no seated rest break required for ~120'. Pt will continue to benefit from skilled PT interventions, recommendation for HHPT remains appropriate follow up. Acute will progress as able.   Follow Up Recommendations  Home health PT;SNF     Equipment Recommendations  None recommended by PT    Recommendations for Other Services       Precautions / Restrictions Precautions Precautions: Fall Restrictions Weight Bearing Restrictions: No    Mobility  Bed Mobility Overal bed mobility: Needs Assistance Bed Mobility: Supine to Sit;Sit to Supine;Rolling Rolling: Supervision   Supine to sit: Supervision Sit to supine: Min assist   General bed mobility comments: pt required increased time sit up to EOB and min assist to bring LE's up into bed. use of bed rail to roll to Lt. rolling completed for pericare/wound care to appkly clean foam dressing.  Transfers Overall transfer level: Needs assistance Equipment used: Rolling walker (2 wheeled) Transfers: Sit to/from  [facility] Sit to Stand: Min guard Stand pivot transfers: Min guard       General transfer comment: cues for technique with RW for power up, pt with good recall for reach back. pt steady wtih small steps to move BSC>bed. pt also performed repeated sit<>stands from [facility] for pericare. required seated breaks after standing ~2 minutes a time due to fatigue.  Ambulation/Gait Ambulation/Gait assistance: Min assist;Min guard Gait Distance (Feet): 120 Feet Assistive device: Rolling walker (2 wheeled) Gait Pattern/deviations: Step-through pattern;Decreased stride length;Trunk flexed;Wide base of support Gait velocity: decr   General Gait Details: pt maintained safe proximity to RW and demonstrated improved foot clearance during swing phase for bil LE's.    Stairs             Wheelchair Mobility    Modified Rankin (Stroke Patients Only)       Balance Overall balance assessment: Needs assistance Sitting-balance support: Feet supported Sitting balance-Leahy Scale: Fair     Standing balance support: Bilateral upper extremity supported;During functional activity Standing balance-Leahy Scale: Poor                              Cognition Arousal/Alertness: Awake/alert Behavior During Therapy: WFL for tasks assessed/performed Overall Cognitive Status: Within Functional Limits for tasks assessed                                        Exercises Other Exercises Other Exercises: 2x 5 reps sit<>stand from EOB, 1 UE use for power up.     General Comments  Pertinent Vitals/Pain Pain Assessment: No/denies pain    Home Living                      Prior Function            PT Goals (current goals can now be found in the care plan section) Acute Rehab PT Goals Patient Stated Goal: get well PT Goal Formulation: With patient Time For Goal Achievement: 04/09/20 Potential to Achieve Goals: Good Progress towards PT  goals: Progressing toward goals    Frequency    Min 3X/week      PT Plan Current plan remains appropriate    Co-evaluation              AM-PAC PT "6 Clicks" Mobility   Outcome Measure  Help needed turning from your back to your side while in a flat bed without using bedrails?: None Help needed moving from lying on your back to sitting on the side of a flat bed without using bedrails?: A Little Help needed moving to and from a bed to a chair (including a wheelchair)?: A Little Help needed standing up from a chair using your arms (e.g., wheelchair or bedside chair)?: A Little Help needed to walk in hospital room?: A Little Help needed climbing 3-5 steps with a railing? : A Lot 6 Click Score: 18    End of Session Equipment Utilized During Treatment: Gait belt Activity Tolerance: Patient tolerated treatment well Patient left: in chair;with call bell/phone within reach;with chair alarm set;with family/visitor present Nurse Communication: Mobility status PT Visit Diagnosis: Difficulty in walking, not elsewhere classified (R26.2);Muscle weakness (generalized) (M62.81)     Time: 2956-2130 PT Time Calculation (min) (ACUTE ONLY): 48 min  Charges:  $Gait Training: 8-22 mins $Therapeutic Exercise: 8-22 mins $Therapeutic Activity: 8-22 mins                     Verner Mould, DPT Acute Rehabilitation Services  Office [phone] Pager [phone]  04/05/2020 2:26 PM

## 2020-04-05 NOTE — Progress Notes (Signed)
Pt buttocks red, very excoriated because of liquid stools over the last few days.  Area cleaned and barrier cream applied.  Wound care nurse consulted.

## 2020-04-05 NOTE — Progress Notes (Signed)
Discharge teaching done with patient and her husband.  Written information given.

## 2020-04-07 DIAGNOSIS — K5751 Diverticulosis of both small and large intestine without perforation or abscess with bleeding: Secondary | ICD-10-CM | POA: Diagnosis not present

## 2020-04-07 DIAGNOSIS — M1990 Unspecified osteoarthritis, unspecified site: Secondary | ICD-10-CM | POA: Diagnosis not present

## 2020-04-07 DIAGNOSIS — G8929 Other chronic pain: Secondary | ICD-10-CM | POA: Diagnosis not present

## 2020-04-07 DIAGNOSIS — M069 Rheumatoid arthritis, unspecified: Secondary | ICD-10-CM | POA: Diagnosis not present

## 2020-04-07 DIAGNOSIS — K5000 Crohn's disease of small intestine without complications: Secondary | ICD-10-CM | POA: Diagnosis not present

## 2020-04-07 DIAGNOSIS — M5416 Radiculopathy, lumbar region: Secondary | ICD-10-CM | POA: Diagnosis not present

## 2020-04-08 LAB — CULTURE, BLOOD (ROUTINE X 2)
Culture: NO GROWTH
Culture: NO GROWTH
Special Requests: ADEQUATE
Special Requests: ADEQUATE

## 2020-04-09 DIAGNOSIS — K5000 Crohn's disease of small intestine without complications: Secondary | ICD-10-CM | POA: Diagnosis not present

## 2020-04-09 DIAGNOSIS — G8929 Other chronic pain: Secondary | ICD-10-CM | POA: Diagnosis not present

## 2020-04-09 DIAGNOSIS — M1990 Unspecified osteoarthritis, unspecified site: Secondary | ICD-10-CM | POA: Diagnosis not present

## 2020-04-09 DIAGNOSIS — K5751 Diverticulosis of both small and large intestine without perforation or abscess with bleeding: Secondary | ICD-10-CM | POA: Diagnosis not present

## 2020-04-09 DIAGNOSIS — M069 Rheumatoid arthritis, unspecified: Secondary | ICD-10-CM | POA: Diagnosis not present

## 2020-04-09 DIAGNOSIS — M5416 Radiculopathy, lumbar region: Secondary | ICD-10-CM | POA: Diagnosis not present

## 2020-04-10 DIAGNOSIS — K5751 Diverticulosis of both small and large intestine without perforation or abscess with bleeding: Secondary | ICD-10-CM | POA: Diagnosis not present

## 2020-04-10 DIAGNOSIS — M069 Rheumatoid arthritis, unspecified: Secondary | ICD-10-CM | POA: Diagnosis not present

## 2020-04-10 DIAGNOSIS — M5416 Radiculopathy, lumbar region: Secondary | ICD-10-CM | POA: Diagnosis not present

## 2020-04-10 DIAGNOSIS — G8929 Other chronic pain: Secondary | ICD-10-CM | POA: Diagnosis not present

## 2020-04-10 DIAGNOSIS — K5000 Crohn's disease of small intestine without complications: Secondary | ICD-10-CM | POA: Diagnosis not present

## 2020-04-10 DIAGNOSIS — M1990 Unspecified osteoarthritis, unspecified site: Secondary | ICD-10-CM | POA: Diagnosis not present

## 2020-04-12 ENCOUNTER — Other Ambulatory Visit: Payer: Self-pay | Admitting: *Deleted

## 2020-04-12 ENCOUNTER — Encounter: Payer: Self-pay | Admitting: *Deleted

## 2020-04-12 NOTE — Patient Outreach (Signed)
[facility] [place]) Care Management [facility] CM Telephone Outreach, EMMI Red-Alert notification PCP office completes Transition of Care follow up post-hospital discharge Post-hospital discharge day # 7  04/12/2020  Helen Hardy 11/22/1950 [id number]  EMMI Red-Alert notification/ General Discharge EMMI call date/ day #: Wednesday April 10, 2020; day # 4 Red-Alert reason(s): "lost interest"  Successful telephone outreach to [employer], 69 y/o female referred to [facility] RN CM 04/11/20 by [facility] CMA for EMMI red-Alert notification as above.  Patient was recently hospitalized September 20-April 05, 2020 for GI bleeding; colonoscopy was completed during hospital visit on 04/01/20.  Patient was discharged home to self-care with home health services.  Patient has history including, but not limited to, anemia, GERD, HTN, non-hodgkins lymphoma (now in remission) and obesity.  HIPAA/ identity verified and purpose of call/ [facility] CM services discussed with patient, who agrees to complete EMMI Red screening call, but declines ongoing [facility] CM follow up, stating that she "has plenty of help coming in," and "does not need anything" that she does not already have.  She confirms that home health services are active and have arrived.  We discussed element of EMMI red-flag notification and depression screening completed- patient completely denies depression and verbalizes understanding to discuss with PCP should these symptoms occur.  Confirms that she has follow up PCP appointment scheduled for Monday 04/15/20- states husband will contnue providing transportation.  Reports has all medications and is taking as prescribed.  Throughout screening call today, patient verbalizes no ongoing care coordination/ disease management/ pharmacy/ community resource needs.  Advised that I would place letter in mail to her and encouraged her to contact me or the [place] department should needs arise, and she is  agreeable.  Plan:  Will make patient inactive with [facility] CM, as she has declined ongoing participation in [place] program, and will make patient's PCP aware of same.  Oneta Rack, RN, BSN, [employer] [facility] Care Management  [phone]

## 2020-04-15 DIAGNOSIS — K559 Vascular disorder of intestine, unspecified: Secondary | ICD-10-CM | POA: Diagnosis not present

## 2020-04-15 DIAGNOSIS — K5792 Diverticulitis of intestine, part unspecified, without perforation or abscess without bleeding: Secondary | ICD-10-CM | POA: Diagnosis not present

## 2020-04-15 DIAGNOSIS — R197 Diarrhea, unspecified: Secondary | ICD-10-CM | POA: Diagnosis not present

## 2020-04-15 DIAGNOSIS — R600 Localized edema: Secondary | ICD-10-CM | POA: Diagnosis not present

## 2020-04-16 DIAGNOSIS — M1990 Unspecified osteoarthritis, unspecified site: Secondary | ICD-10-CM | POA: Diagnosis not present

## 2020-04-16 DIAGNOSIS — G8929 Other chronic pain: Secondary | ICD-10-CM | POA: Diagnosis not present

## 2020-04-16 DIAGNOSIS — K5751 Diverticulosis of both small and large intestine without perforation or abscess with bleeding: Secondary | ICD-10-CM | POA: Diagnosis not present

## 2020-04-16 DIAGNOSIS — M069 Rheumatoid arthritis, unspecified: Secondary | ICD-10-CM | POA: Diagnosis not present

## 2020-04-16 DIAGNOSIS — M5416 Radiculopathy, lumbar region: Secondary | ICD-10-CM | POA: Diagnosis not present

## 2020-04-16 DIAGNOSIS — K5000 Crohn's disease of small intestine without complications: Secondary | ICD-10-CM | POA: Diagnosis not present

## 2020-04-17 DIAGNOSIS — G8929 Other chronic pain: Secondary | ICD-10-CM | POA: Diagnosis not present

## 2020-04-17 DIAGNOSIS — M5416 Radiculopathy, lumbar region: Secondary | ICD-10-CM | POA: Diagnosis not present

## 2020-04-17 DIAGNOSIS — K5751 Diverticulosis of both small and large intestine without perforation or abscess with bleeding: Secondary | ICD-10-CM | POA: Diagnosis not present

## 2020-04-17 DIAGNOSIS — K5000 Crohn's disease of small intestine without complications: Secondary | ICD-10-CM | POA: Diagnosis not present

## 2020-04-17 DIAGNOSIS — M1990 Unspecified osteoarthritis, unspecified site: Secondary | ICD-10-CM | POA: Diagnosis not present

## 2020-04-17 DIAGNOSIS — M069 Rheumatoid arthritis, unspecified: Secondary | ICD-10-CM | POA: Diagnosis not present

## 2020-04-18 ENCOUNTER — Inpatient Hospital Stay (HOSPITAL_BASED_OUTPATIENT_CLINIC_OR_DEPARTMENT_OTHER): Payer: Medicare Other | Admitting: Family

## 2020-04-18 ENCOUNTER — Other Ambulatory Visit: Payer: Self-pay

## 2020-04-18 ENCOUNTER — Encounter: Payer: Self-pay | Admitting: Family

## 2020-04-18 ENCOUNTER — Inpatient Hospital Stay: Payer: Medicare Other

## 2020-04-18 ENCOUNTER — Inpatient Hospital Stay: Payer: Medicare Other | Attending: Hematology & Oncology

## 2020-04-18 VITALS — BP 155/95

## 2020-04-18 VITALS — BP 164/80 | HR 84 | Temp 99.2°F | Resp 20 | Ht 60.0 in | Wt 223.0 lb

## 2020-04-18 DIAGNOSIS — N183 Chronic kidney disease, stage 3 unspecified: Secondary | ICD-10-CM | POA: Diagnosis not present

## 2020-04-18 DIAGNOSIS — K5090 Crohn's disease, unspecified, without complications: Secondary | ICD-10-CM | POA: Insufficient documentation

## 2020-04-18 DIAGNOSIS — N189 Chronic kidney disease, unspecified: Secondary | ICD-10-CM | POA: Insufficient documentation

## 2020-04-18 DIAGNOSIS — Z8572 Personal history of non-Hodgkin lymphomas: Secondary | ICD-10-CM | POA: Insufficient documentation

## 2020-04-18 DIAGNOSIS — I129 Hypertensive chronic kidney disease with stage 1 through stage 4 chronic kidney disease, or unspecified chronic kidney disease: Secondary | ICD-10-CM | POA: Diagnosis not present

## 2020-04-18 DIAGNOSIS — K909 Intestinal malabsorption, unspecified: Secondary | ICD-10-CM

## 2020-04-18 DIAGNOSIS — D500 Iron deficiency anemia secondary to blood loss (chronic): Secondary | ICD-10-CM

## 2020-04-18 DIAGNOSIS — E611 Iron deficiency: Secondary | ICD-10-CM | POA: Diagnosis not present

## 2020-04-18 DIAGNOSIS — D573 Sickle-cell trait: Secondary | ICD-10-CM | POA: Insufficient documentation

## 2020-04-18 DIAGNOSIS — D631 Anemia in chronic kidney disease: Secondary | ICD-10-CM | POA: Insufficient documentation

## 2020-04-18 DIAGNOSIS — D509 Iron deficiency anemia, unspecified: Secondary | ICD-10-CM | POA: Diagnosis not present

## 2020-04-18 DIAGNOSIS — Z79899 Other long term (current) drug therapy: Secondary | ICD-10-CM | POA: Diagnosis not present

## 2020-04-18 LAB — CBC WITH DIFFERENTIAL (CANCER CENTER ONLY)
Abs Immature Granulocytes: 0.03 10*3/uL (ref 0.00–0.07)
Basophils Absolute: 0 10*3/uL (ref 0.0–0.1)
Basophils Relative: 0 %
Eosinophils Absolute: 0 10*3/uL (ref 0.0–0.5)
Eosinophils Relative: 0 %
HCT: 28.9 % — ABNORMAL LOW (ref 36.0–46.0)
Hemoglobin: 8.9 g/dL — ABNORMAL LOW (ref 12.0–15.0)
Immature Granulocytes: 0 %
Lymphocytes Relative: 38 %
Lymphs Abs: 2.6 10*3/uL (ref 0.7–4.0)
MCH: 35 pg — ABNORMAL HIGH (ref 26.0–34.0)
MCHC: 30.8 g/dL (ref 30.0–36.0)
MCV: 113.8 fL — ABNORMAL HIGH (ref 80.0–100.0)
Monocytes Absolute: 0.7 10*3/uL (ref 0.1–1.0)
Monocytes Relative: 10 %
Neutro Abs: 3.5 10*3/uL (ref 1.7–7.7)
Neutrophils Relative %: 52 %
Platelet Count: 126 10*3/uL — ABNORMAL LOW (ref 150–400)
RBC: 2.54 MIL/uL — ABNORMAL LOW (ref 3.87–5.11)
RDW: 18.5 % — ABNORMAL HIGH (ref 11.5–15.5)
WBC Count: 6.8 10*3/uL (ref 4.0–10.5)
nRBC: 0 % (ref 0.0–0.2)

## 2020-04-18 LAB — CMP (CANCER CENTER ONLY)
ALT: 23 U/L (ref 0–44)
AST: 30 U/L (ref 15–41)
Albumin: 4.1 g/dL (ref 3.5–5.0)
Alkaline Phosphatase: 57 U/L (ref 38–126)
Anion gap: 11 (ref 5–15)
BUN: 7 mg/dL — ABNORMAL LOW (ref 8–23)
CO2: 28 mmol/L (ref 22–32)
Calcium: 9.8 mg/dL (ref 8.9–10.3)
Chloride: 106 mmol/L (ref 98–111)
Creatinine: 0.98 mg/dL (ref 0.44–1.00)
GFR, Estimated: 58 mL/min — ABNORMAL LOW (ref >60)
Glucose, Bld: 101 mg/dL — ABNORMAL HIGH (ref 70–99)
Potassium: 4.1 mmol/L (ref 3.5–5.1)
Sodium: 145 mmol/L (ref 135–145)
Total Bilirubin: 0.5 mg/dL (ref 0.3–1.2)
Total Protein: 6.2 g/dL — ABNORMAL LOW (ref 6.5–8.1)

## 2020-04-18 LAB — RETICULOCYTES
Immature Retic Fract: 25.3 % — ABNORMAL HIGH (ref 2.3–15.9)
RBC.: 2.51 MIL/uL — ABNORMAL LOW (ref 3.87–5.11)
Retic Count, Absolute: 106.9 10*3/uL (ref 19.0–186.0)
Retic Ct Pct: 4.3 % — ABNORMAL HIGH (ref 0.4–3.1)

## 2020-04-18 MED ORDER — EPOETIN ALFA-EPBX 40000 UNIT/ML IJ SOLN
INTRAMUSCULAR | Status: AC
  Filled 2020-04-18: qty 1

## 2020-04-18 MED ORDER — EPOETIN ALFA-EPBX 40000 UNIT/ML IJ SOLN
40000.0000 [IU] | Freq: Once | INTRAMUSCULAR | Status: AC
  Administered 2020-04-18: 40000 [IU] via SUBCUTANEOUS

## 2020-04-18 NOTE — Progress Notes (Signed)
Hematology and Oncology Follow Up Visit  Helen Hardy [id number] 1950/09/22 69 y.o. 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 is here today for follow-up. She recently got out of the hospital after having GI bleed and colonoscopy revealed diagnosis of Crohn's disease.  She is home now and her husband is her main caregiver. She is slowly feeling better but still feels quite fatigued.  She has not noted any GI blood loss since discharge. No bruising or petechiae.  No fever, chills, n/v, cough, rash, dizziness, SOB, chest pain, palpitations, abdominal pain or changes in bowel or bladder habits.  The swelling in her legs is described as stable. She is taking Lasix. No erythema or pitting edema noted on exam. Pedal pulses are 2+.  No falls or syncopal episodes to report.  She states that her appetite is better. She is doing her best to stay well hydrated. Her weight is stable at 223 lbs.   ECOG Performance Status: 2 - Symptomatic, <50% confined to bed  Medications:  Allergies as of 04/18/2020      Reactions   Oxycodone Nausea And Vomiting   Tramadol Other (See Comments)   UNKNOWN REACTION      Medication List       Accurate as of April 18, 2020 12:45 PM. If you have any questions, ask your nurse or doctor.        estradiol 1 MG tablet Commonly known as: ESTRACE Take 1 mg by mouth daily.   folic acid 1 MG tablet Commonly known as: FOLVITE Take 2 mg by mouth daily.   gabapentin 300 MG capsule Commonly known as: NEURONTIN Take 300-600 mg by mouth See admin instructions. 300 mg in the morning and at 2 pm. 600 mg every night   HYDROcodone-acetaminophen 10-325 MG tablet Commonly  known as: NORCO Take 1 tablet by mouth every 8 (eight) hours as needed for moderate pain or severe pain.   leflunomide 20 MG tablet Commonly known as: ARAVA Take 20 mg by mouth daily.   methocarbamol 500 MG tablet Commonly known as: ROBAXIN Take 750 mg by mouth in the morning and at bedtime.   multivitamin tablet Take 1 tablet by mouth daily.   olmesartan 20 MG tablet Commonly known as: BENICAR Take 20 mg by mouth daily.   ondansetron 4 MG tablet Commonly known as: ZOFRAN Take 4 mg by mouth every 8 (eight) hours as needed for nausea or vomiting.   pantoprazole 40 MG tablet Commonly known as: PROTONIX Take 40 mg by mouth daily.   predniSONE 10 MG tablet Commonly known as: DELTASONE Take 10 mg by mouth daily.   triamcinolone cream 0.1 % Commonly known as: KENALOG Apply 1 application topically as needed (for rash).   vitamin B-12 1000 MCG tablet Commonly known as: CYANOCOBALAMIN Take 1,000 mcg by mouth daily.   vitamin C 1000 MG tablet Take 1,000 mg by mouth daily.   Vitamin D3 25 MCG (1000 UT) Caps Take 2,000 Units by mouth daily.       Allergies:  Allergies  Allergen Reactions   Oxycodone Nausea And Vomiting   Tramadol Other (See Comments)    UNKNOWN REACTION    Past Medical History, Surgical history, Social history,  and Family History were reviewed and updated.  Review of Systems: All other 10 point review of systems is negative.   Physical Exam:  height is 5' (1.524 m) and weight is 223 lb (101.2 kg). Her oral temperature is 99.2 F (37.3 C). Her blood pressure is 164/80 (abnormal) and her pulse is 84. Her respiration is 20 and oxygen saturation is 100%.   Wt Readings from Last 3 Encounters:  04/18/20 223 lb (101.2 kg)  04/01/20 233 lb 7.5 oz (105.9 kg)  03/19/20 221 lb (100.2 kg)    Ocular: Sclerae unicteric, pupils equal, round and reactive to light Ear-nose-throat: Oropharynx clear, dentition fair Lymphatic: No cervical or supraclavicular  adenopathy Lungs no rales or rhonchi, good excursion bilaterally Heart regular rate and rhythm, no murmur appreciated Abd soft, nontender, positive bowel sounds, no liver or spleen tip palpated on exam, no fluid wave  MSK no focal spinal tenderness, no joint edema Neuro: non-focal, well-oriented, appropriate affect Breasts: Deferred   Lab Results  Component Value Date   WBC 6.8 04/18/2020   HGB 8.9 (L) 04/18/2020   HCT 28.9 (L) 04/18/2020   MCV 113.8 (H) 04/18/2020   PLT 126 (L) 04/18/2020   Lab Results  Component Value Date   FERRITIN 1,684 (H) 03/27/2020   IRON 15 (L) 03/27/2020   TIBC 166 (L) 03/27/2020   UIBC 151 03/27/2020   IRONPCTSAT 9 (L) 03/27/2020   Lab Results  Component Value Date   RETICCTPCT 4.3 (H) 04/18/2020   RBC 2.54 (L) 04/18/2020   RBC 2.51 (L) 04/18/2020   RETICCTABS 41.0 06/21/2015   No results found for: KPAFRELGTCHN, LAMBDASER, KAPLAMBRATIO No results found for: IGGSERUM, IGA, IGMSERUM No results found for: Odetta Pink, SPEI   Chemistry      Component Value Date/Time   NA 145 04/18/2020 1014   NA 144 04/29/2017 0929   K 4.1 04/18/2020 1014   K 4.0 04/29/2017 0929   CL 106 04/18/2020 1014   CL 103 06/21/2015 0800   CO2 28 04/18/2020 1014   CO2 25 04/29/2017 0929   BUN 7 (L) 04/18/2020 1014   BUN 17.7 04/29/2017 0929   CREATININE 0.98 04/18/2020 1014   CREATININE 0.8 04/29/2017 0929      Component Value Date/Time   CALCIUM 9.8 04/18/2020 1014   CALCIUM 9.3 04/29/2017 0929   ALKPHOS 57 04/18/2020 1014   ALKPHOS 55 04/29/2017 0929   AST 30 04/18/2020 1014   AST 19 04/29/2017 0929   ALT 23 04/18/2020 1014   ALT 16 04/29/2017 0929   BILITOT 0.5 04/18/2020 1014   BILITOT 0.43 04/29/2017 0929       Impression and Plan: [name] is a very pleasant 69yo African American female with 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  She also has  intermittent iron deficiency anemia secondary to intermittent Gi blood loss with Crohn's and sickle cell trait. She received Retacrit today for Hgb 8.9.  Iron studies are pending and we will replace if needed.  Follow-up in 1 month.  She was encouraged to contact our office with any questions or concerns.   Laverna Peace, NP 10/14/202112:45 PM

## 2020-04-18 NOTE — Patient Instructions (Signed)

## 2020-04-19 LAB — IRON AND TIBC
Iron: 98 ug/dL (ref 41–142)
Saturation Ratios: 43 % (ref 21–57)
TIBC: 226 ug/dL — ABNORMAL LOW (ref 236–444)
UIBC: 128 ug/dL (ref 120–384)

## 2020-04-19 LAB — FERRITIN: Ferritin: 3471 ng/mL — ABNORMAL HIGH (ref 11–307)

## 2020-04-22 DIAGNOSIS — M0579 Rheumatoid arthritis with rheumatoid factor of multiple sites without organ or systems involvement: Secondary | ICD-10-CM | POA: Diagnosis not present

## 2020-04-23 DIAGNOSIS — G894 Chronic pain syndrome: Secondary | ICD-10-CM | POA: Diagnosis not present

## 2020-04-23 DIAGNOSIS — M5416 Radiculopathy, lumbar region: Secondary | ICD-10-CM | POA: Diagnosis not present

## 2020-04-23 DIAGNOSIS — M1990 Unspecified osteoarthritis, unspecified site: Secondary | ICD-10-CM | POA: Diagnosis not present

## 2020-04-23 DIAGNOSIS — M069 Rheumatoid arthritis, unspecified: Secondary | ICD-10-CM | POA: Diagnosis not present

## 2020-04-23 DIAGNOSIS — G8929 Other chronic pain: Secondary | ICD-10-CM | POA: Diagnosis not present

## 2020-04-23 DIAGNOSIS — K5000 Crohn's disease of small intestine without complications: Secondary | ICD-10-CM | POA: Diagnosis not present

## 2020-04-23 DIAGNOSIS — K5751 Diverticulosis of both small and large intestine without perforation or abscess with bleeding: Secondary | ICD-10-CM | POA: Diagnosis not present

## 2020-04-24 ENCOUNTER — Other Ambulatory Visit: Payer: Self-pay | Admitting: *Deleted

## 2020-04-24 ENCOUNTER — Telehealth: Payer: Self-pay | Admitting: *Deleted

## 2020-04-24 DIAGNOSIS — K909 Intestinal malabsorption, unspecified: Secondary | ICD-10-CM

## 2020-04-24 DIAGNOSIS — N183 Chronic kidney disease, stage 3 unspecified: Secondary | ICD-10-CM

## 2020-04-24 DIAGNOSIS — D631 Anemia in chronic kidney disease: Secondary | ICD-10-CM

## 2020-04-24 DIAGNOSIS — D500 Iron deficiency anemia secondary to blood loss (chronic): Secondary | ICD-10-CM

## 2020-04-24 NOTE — Telephone Encounter (Signed)
Message received from patient stating that she is still "tired" and would like to know what she should do.  Jory Ee NP notified and order received for pt to come in for lab only (CBC) appt..  Message sent to scheduling.

## 2020-04-25 ENCOUNTER — Inpatient Hospital Stay: Payer: Medicare Other

## 2020-04-25 ENCOUNTER — Other Ambulatory Visit: Payer: Self-pay

## 2020-04-25 DIAGNOSIS — N189 Chronic kidney disease, unspecified: Secondary | ICD-10-CM | POA: Diagnosis not present

## 2020-04-25 DIAGNOSIS — I129 Hypertensive chronic kidney disease with stage 1 through stage 4 chronic kidney disease, or unspecified chronic kidney disease: Secondary | ICD-10-CM | POA: Diagnosis not present

## 2020-04-25 DIAGNOSIS — K5751 Diverticulosis of both small and large intestine without perforation or abscess with bleeding: Secondary | ICD-10-CM | POA: Diagnosis not present

## 2020-04-25 DIAGNOSIS — Z79899 Other long term (current) drug therapy: Secondary | ICD-10-CM | POA: Diagnosis not present

## 2020-04-25 DIAGNOSIS — D573 Sickle-cell trait: Secondary | ICD-10-CM | POA: Diagnosis not present

## 2020-04-25 DIAGNOSIS — D500 Iron deficiency anemia secondary to blood loss (chronic): Secondary | ICD-10-CM

## 2020-04-25 DIAGNOSIS — K5000 Crohn's disease of small intestine without complications: Secondary | ICD-10-CM | POA: Diagnosis not present

## 2020-04-25 DIAGNOSIS — M1990 Unspecified osteoarthritis, unspecified site: Secondary | ICD-10-CM | POA: Diagnosis not present

## 2020-04-25 DIAGNOSIS — D631 Anemia in chronic kidney disease: Secondary | ICD-10-CM

## 2020-04-25 DIAGNOSIS — D509 Iron deficiency anemia, unspecified: Secondary | ICD-10-CM | POA: Diagnosis not present

## 2020-04-25 DIAGNOSIS — N183 Chronic kidney disease, stage 3 unspecified: Secondary | ICD-10-CM

## 2020-04-25 DIAGNOSIS — M069 Rheumatoid arthritis, unspecified: Secondary | ICD-10-CM | POA: Diagnosis not present

## 2020-04-25 LAB — CBC WITH DIFFERENTIAL (CANCER CENTER ONLY)
Abs Immature Granulocytes: 0.05 10*3/uL (ref 0.00–0.07)
Basophils Absolute: 0 10*3/uL (ref 0.0–0.1)
Basophils Relative: 0 %
Eosinophils Absolute: 0 10*3/uL (ref 0.0–0.5)
Eosinophils Relative: 0 %
HCT: 31.8 % — ABNORMAL LOW (ref 36.0–46.0)
Hemoglobin: 9.8 g/dL — ABNORMAL LOW (ref 12.0–15.0)
Immature Granulocytes: 1 %
Lymphocytes Relative: 61 %
Lymphs Abs: 3.6 10*3/uL (ref 0.7–4.0)
MCH: 35.3 pg — ABNORMAL HIGH (ref 26.0–34.0)
MCHC: 30.8 g/dL (ref 30.0–36.0)
MCV: 114.4 fL — ABNORMAL HIGH (ref 80.0–100.0)
Monocytes Absolute: 0.6 10*3/uL (ref 0.1–1.0)
Monocytes Relative: 10 %
Neutro Abs: 1.6 10*3/uL — ABNORMAL LOW (ref 1.7–7.7)
Neutrophils Relative %: 28 %
Platelet Count: 131 10*3/uL — ABNORMAL LOW (ref 150–400)
RBC: 2.78 MIL/uL — ABNORMAL LOW (ref 3.87–5.11)
RDW: 18.8 % — ABNORMAL HIGH (ref 11.5–15.5)
WBC Count: 5.8 10*3/uL (ref 4.0–10.5)
nRBC: 0.5 % — ABNORMAL HIGH (ref 0.0–0.2)

## 2020-04-25 LAB — CMP (CANCER CENTER ONLY)
ALT: 22 U/L (ref 0–44)
AST: 31 U/L (ref 15–41)
Albumin: 4.1 g/dL (ref 3.5–5.0)
Alkaline Phosphatase: 57 U/L (ref 38–126)
Anion gap: 10 (ref 5–15)
BUN: 13 mg/dL (ref 8–23)
CO2: 28 mmol/L (ref 22–32)
Calcium: 10 mg/dL (ref 8.9–10.3)
Chloride: 106 mmol/L (ref 98–111)
Creatinine: 1.07 mg/dL — ABNORMAL HIGH (ref 0.44–1.00)
GFR, Estimated: 56 mL/min — ABNORMAL LOW (ref >60)
Glucose, Bld: 98 mg/dL (ref 70–99)
Potassium: 3.9 mmol/L (ref 3.5–5.1)
Sodium: 144 mmol/L (ref 135–145)
Total Bilirubin: 0.5 mg/dL (ref 0.3–1.2)
Total Protein: 6.6 g/dL (ref 6.5–8.1)

## 2020-04-25 LAB — RETICULOCYTES
Immature Retic Fract: 32.6 % — ABNORMAL HIGH (ref 2.3–15.9)
RBC.: 2.73 MIL/uL — ABNORMAL LOW (ref 3.87–5.11)
Retic Count, Absolute: 141.1 10*3/uL (ref 19.0–186.0)
Retic Ct Pct: 5.2 % — ABNORMAL HIGH (ref 0.4–3.1)

## 2020-04-26 DIAGNOSIS — M069 Rheumatoid arthritis, unspecified: Secondary | ICD-10-CM | POA: Diagnosis not present

## 2020-04-26 DIAGNOSIS — M1990 Unspecified osteoarthritis, unspecified site: Secondary | ICD-10-CM | POA: Diagnosis not present

## 2020-04-26 DIAGNOSIS — G8929 Other chronic pain: Secondary | ICD-10-CM | POA: Diagnosis not present

## 2020-04-26 DIAGNOSIS — M5416 Radiculopathy, lumbar region: Secondary | ICD-10-CM | POA: Diagnosis not present

## 2020-04-26 DIAGNOSIS — K5000 Crohn's disease of small intestine without complications: Secondary | ICD-10-CM | POA: Diagnosis not present

## 2020-04-26 DIAGNOSIS — K5751 Diverticulosis of both small and large intestine without perforation or abscess with bleeding: Secondary | ICD-10-CM | POA: Diagnosis not present

## 2020-04-26 LAB — FERRITIN: Ferritin: 2885 ng/mL — ABNORMAL HIGH (ref 11–307)

## 2020-04-26 LAB — IRON AND TIBC
Iron: 93 ug/dL (ref 41–142)
Saturation Ratios: 40 % (ref 21–57)
TIBC: 235 ug/dL — ABNORMAL LOW (ref 236–444)
UIBC: 142 ug/dL (ref 120–384)

## 2020-04-30 ENCOUNTER — Telehealth: Payer: Self-pay | Admitting: *Deleted

## 2020-04-30 DIAGNOSIS — R739 Hyperglycemia, unspecified: Secondary | ICD-10-CM | POA: Diagnosis not present

## 2020-04-30 DIAGNOSIS — K5751 Diverticulosis of both small and large intestine without perforation or abscess with bleeding: Secondary | ICD-10-CM | POA: Diagnosis not present

## 2020-04-30 DIAGNOSIS — N183 Chronic kidney disease, stage 3 unspecified: Secondary | ICD-10-CM | POA: Diagnosis not present

## 2020-04-30 DIAGNOSIS — M1990 Unspecified osteoarthritis, unspecified site: Secondary | ICD-10-CM | POA: Diagnosis not present

## 2020-04-30 DIAGNOSIS — Z8572 Personal history of non-Hodgkin lymphomas: Secondary | ICD-10-CM | POA: Diagnosis not present

## 2020-04-30 DIAGNOSIS — Z7952 Long term (current) use of systemic steroids: Secondary | ICD-10-CM | POA: Diagnosis not present

## 2020-04-30 DIAGNOSIS — Z79891 Long term (current) use of opiate analgesic: Secondary | ICD-10-CM | POA: Diagnosis not present

## 2020-04-30 DIAGNOSIS — K219 Gastro-esophageal reflux disease without esophagitis: Secondary | ICD-10-CM | POA: Diagnosis not present

## 2020-04-30 DIAGNOSIS — Z9181 History of falling: Secondary | ICD-10-CM | POA: Diagnosis not present

## 2020-04-30 DIAGNOSIS — K5000 Crohn's disease of small intestine without complications: Secondary | ICD-10-CM | POA: Diagnosis not present

## 2020-04-30 DIAGNOSIS — Z6841 Body Mass Index (BMI) 40.0 and over, adult: Secondary | ICD-10-CM | POA: Diagnosis not present

## 2020-04-30 DIAGNOSIS — I129 Hypertensive chronic kidney disease with stage 1 through stage 4 chronic kidney disease, or unspecified chronic kidney disease: Secondary | ICD-10-CM | POA: Diagnosis not present

## 2020-04-30 DIAGNOSIS — Z79899 Other long term (current) drug therapy: Secondary | ICD-10-CM | POA: Diagnosis not present

## 2020-04-30 DIAGNOSIS — D631 Anemia in chronic kidney disease: Secondary | ICD-10-CM | POA: Diagnosis not present

## 2020-04-30 DIAGNOSIS — Z90710 Acquired absence of both cervix and uterus: Secondary | ICD-10-CM | POA: Diagnosis not present

## 2020-04-30 DIAGNOSIS — M069 Rheumatoid arthritis, unspecified: Secondary | ICD-10-CM | POA: Diagnosis not present

## 2020-04-30 DIAGNOSIS — G8929 Other chronic pain: Secondary | ICD-10-CM | POA: Diagnosis not present

## 2020-04-30 DIAGNOSIS — M5416 Radiculopathy, lumbar region: Secondary | ICD-10-CM | POA: Diagnosis not present

## 2020-04-30 NOTE — Telephone Encounter (Signed)
Message received from patient stating that she is still "tired" and would like to know results from 04/25/20.  Lab results reviewed by S. [place].  Call placed back to patient and patient notified per order of S. [place] NP that lab results "look better" and to f/u with her PCP if symptoms persist.  Pt appreciative of call back and has no further questions at this time.

## 2020-05-01 DIAGNOSIS — M5416 Radiculopathy, lumbar region: Secondary | ICD-10-CM | POA: Diagnosis not present

## 2020-05-01 DIAGNOSIS — G8929 Other chronic pain: Secondary | ICD-10-CM | POA: Diagnosis not present

## 2020-05-01 DIAGNOSIS — K5751 Diverticulosis of both small and large intestine without perforation or abscess with bleeding: Secondary | ICD-10-CM | POA: Diagnosis not present

## 2020-05-01 DIAGNOSIS — K5000 Crohn's disease of small intestine without complications: Secondary | ICD-10-CM | POA: Diagnosis not present

## 2020-05-01 DIAGNOSIS — M069 Rheumatoid arthritis, unspecified: Secondary | ICD-10-CM | POA: Diagnosis not present

## 2020-05-01 DIAGNOSIS — M1990 Unspecified osteoarthritis, unspecified site: Secondary | ICD-10-CM | POA: Diagnosis not present

## 2020-05-03 DIAGNOSIS — M069 Rheumatoid arthritis, unspecified: Secondary | ICD-10-CM | POA: Diagnosis not present

## 2020-05-03 DIAGNOSIS — K5000 Crohn's disease of small intestine without complications: Secondary | ICD-10-CM | POA: Diagnosis not present

## 2020-05-03 DIAGNOSIS — G8929 Other chronic pain: Secondary | ICD-10-CM | POA: Diagnosis not present

## 2020-05-03 DIAGNOSIS — K5751 Diverticulosis of both small and large intestine without perforation or abscess with bleeding: Secondary | ICD-10-CM | POA: Diagnosis not present

## 2020-05-03 DIAGNOSIS — M5416 Radiculopathy, lumbar region: Secondary | ICD-10-CM | POA: Diagnosis not present

## 2020-05-03 DIAGNOSIS — M1990 Unspecified osteoarthritis, unspecified site: Secondary | ICD-10-CM | POA: Diagnosis not present

## 2020-05-05 DIAGNOSIS — Z23 Encounter for immunization: Secondary | ICD-10-CM | POA: Diagnosis not present

## 2020-05-06 DIAGNOSIS — G8929 Other chronic pain: Secondary | ICD-10-CM | POA: Diagnosis not present

## 2020-05-06 DIAGNOSIS — K5751 Diverticulosis of both small and large intestine without perforation or abscess with bleeding: Secondary | ICD-10-CM | POA: Diagnosis not present

## 2020-05-06 DIAGNOSIS — M069 Rheumatoid arthritis, unspecified: Secondary | ICD-10-CM | POA: Diagnosis not present

## 2020-05-06 DIAGNOSIS — K5000 Crohn's disease of small intestine without complications: Secondary | ICD-10-CM | POA: Diagnosis not present

## 2020-05-06 DIAGNOSIS — M1990 Unspecified osteoarthritis, unspecified site: Secondary | ICD-10-CM | POA: Diagnosis not present

## 2020-05-06 DIAGNOSIS — M5416 Radiculopathy, lumbar region: Secondary | ICD-10-CM | POA: Diagnosis not present

## 2020-05-07 DIAGNOSIS — M25571 Pain in right ankle and joints of right foot: Secondary | ICD-10-CM | POA: Diagnosis not present

## 2020-05-07 DIAGNOSIS — M65871 Other synovitis and tenosynovitis, right ankle and foot: Secondary | ICD-10-CM | POA: Diagnosis not present

## 2020-05-08 DIAGNOSIS — G8929 Other chronic pain: Secondary | ICD-10-CM | POA: Diagnosis not present

## 2020-05-08 DIAGNOSIS — K5751 Diverticulosis of both small and large intestine without perforation or abscess with bleeding: Secondary | ICD-10-CM | POA: Diagnosis not present

## 2020-05-08 DIAGNOSIS — M069 Rheumatoid arthritis, unspecified: Secondary | ICD-10-CM | POA: Diagnosis not present

## 2020-05-08 DIAGNOSIS — K5000 Crohn's disease of small intestine without complications: Secondary | ICD-10-CM | POA: Diagnosis not present

## 2020-05-08 DIAGNOSIS — M1990 Unspecified osteoarthritis, unspecified site: Secondary | ICD-10-CM | POA: Diagnosis not present

## 2020-05-08 DIAGNOSIS — M5416 Radiculopathy, lumbar region: Secondary | ICD-10-CM | POA: Diagnosis not present

## 2020-05-15 DIAGNOSIS — M1990 Unspecified osteoarthritis, unspecified site: Secondary | ICD-10-CM | POA: Diagnosis not present

## 2020-05-15 DIAGNOSIS — K5751 Diverticulosis of both small and large intestine without perforation or abscess with bleeding: Secondary | ICD-10-CM | POA: Diagnosis not present

## 2020-05-15 DIAGNOSIS — G8929 Other chronic pain: Secondary | ICD-10-CM | POA: Diagnosis not present

## 2020-05-15 DIAGNOSIS — M5416 Radiculopathy, lumbar region: Secondary | ICD-10-CM | POA: Diagnosis not present

## 2020-05-15 DIAGNOSIS — K5000 Crohn's disease of small intestine without complications: Secondary | ICD-10-CM | POA: Diagnosis not present

## 2020-05-15 DIAGNOSIS — M069 Rheumatoid arthritis, unspecified: Secondary | ICD-10-CM | POA: Diagnosis not present

## 2020-05-16 ENCOUNTER — Other Ambulatory Visit: Payer: Medicare Other

## 2020-05-16 ENCOUNTER — Ambulatory Visit: Payer: Medicare Other | Admitting: Family

## 2020-05-16 ENCOUNTER — Ambulatory Visit: Payer: Medicare Other

## 2020-05-17 DIAGNOSIS — M961 Postlaminectomy syndrome, not elsewhere classified: Secondary | ICD-10-CM | POA: Diagnosis not present

## 2020-05-17 DIAGNOSIS — M5136 Other intervertebral disc degeneration, lumbar region: Secondary | ICD-10-CM | POA: Diagnosis not present

## 2020-05-17 DIAGNOSIS — M5459 Other low back pain: Secondary | ICD-10-CM | POA: Diagnosis not present

## 2020-05-20 DIAGNOSIS — M0579 Rheumatoid arthritis with rheumatoid factor of multiple sites without organ or systems involvement: Secondary | ICD-10-CM | POA: Diagnosis not present

## 2020-05-22 DIAGNOSIS — M65871 Other synovitis and tenosynovitis, right ankle and foot: Secondary | ICD-10-CM | POA: Diagnosis not present

## 2020-05-22 DIAGNOSIS — M25571 Pain in right ankle and joints of right foot: Secondary | ICD-10-CM | POA: Diagnosis not present

## 2020-05-23 DIAGNOSIS — K5751 Diverticulosis of both small and large intestine without perforation or abscess with bleeding: Secondary | ICD-10-CM | POA: Diagnosis not present

## 2020-05-23 DIAGNOSIS — M1990 Unspecified osteoarthritis, unspecified site: Secondary | ICD-10-CM | POA: Diagnosis not present

## 2020-05-23 DIAGNOSIS — M069 Rheumatoid arthritis, unspecified: Secondary | ICD-10-CM | POA: Diagnosis not present

## 2020-05-23 DIAGNOSIS — M5416 Radiculopathy, lumbar region: Secondary | ICD-10-CM | POA: Diagnosis not present

## 2020-05-23 DIAGNOSIS — K5000 Crohn's disease of small intestine without complications: Secondary | ICD-10-CM | POA: Diagnosis not present

## 2020-05-23 DIAGNOSIS — G8929 Other chronic pain: Secondary | ICD-10-CM | POA: Diagnosis not present

## 2020-05-24 ENCOUNTER — Inpatient Hospital Stay: Payer: Medicare Other

## 2020-05-24 ENCOUNTER — Inpatient Hospital Stay (HOSPITAL_BASED_OUTPATIENT_CLINIC_OR_DEPARTMENT_OTHER): Payer: Medicare Other | Admitting: Hematology & Oncology

## 2020-05-24 ENCOUNTER — Inpatient Hospital Stay: Payer: Medicare Other | Attending: Hematology & Oncology

## 2020-05-24 ENCOUNTER — Telehealth: Payer: Self-pay

## 2020-05-24 ENCOUNTER — Other Ambulatory Visit: Payer: Self-pay

## 2020-05-24 ENCOUNTER — Encounter: Payer: Self-pay | Admitting: Hematology & Oncology

## 2020-05-24 VITALS — BP 150/77 | HR 77 | Temp 98.6°F | Resp 19 | Wt 219.0 lb

## 2020-05-24 DIAGNOSIS — Z79899 Other long term (current) drug therapy: Secondary | ICD-10-CM | POA: Insufficient documentation

## 2020-05-24 DIAGNOSIS — D631 Anemia in chronic kidney disease: Secondary | ICD-10-CM | POA: Insufficient documentation

## 2020-05-24 DIAGNOSIS — K909 Intestinal malabsorption, unspecified: Secondary | ICD-10-CM | POA: Diagnosis not present

## 2020-05-24 DIAGNOSIS — D509 Iron deficiency anemia, unspecified: Secondary | ICD-10-CM | POA: Diagnosis not present

## 2020-05-24 DIAGNOSIS — N189 Chronic kidney disease, unspecified: Secondary | ICD-10-CM | POA: Diagnosis not present

## 2020-05-24 DIAGNOSIS — I129 Hypertensive chronic kidney disease with stage 1 through stage 4 chronic kidney disease, or unspecified chronic kidney disease: Secondary | ICD-10-CM | POA: Insufficient documentation

## 2020-05-24 DIAGNOSIS — D500 Iron deficiency anemia secondary to blood loss (chronic): Secondary | ICD-10-CM

## 2020-05-24 DIAGNOSIS — E611 Iron deficiency: Secondary | ICD-10-CM | POA: Diagnosis not present

## 2020-05-24 DIAGNOSIS — Z8572 Personal history of non-Hodgkin lymphomas: Secondary | ICD-10-CM | POA: Insufficient documentation

## 2020-05-24 DIAGNOSIS — N183 Chronic kidney disease, stage 3 unspecified: Secondary | ICD-10-CM

## 2020-05-24 DIAGNOSIS — K5090 Crohn's disease, unspecified, without complications: Secondary | ICD-10-CM | POA: Insufficient documentation

## 2020-05-24 LAB — CMP (CANCER CENTER ONLY)
ALT: 18 U/L (ref 0–44)
AST: 20 U/L (ref 15–41)
Albumin: 4 g/dL (ref 3.5–5.0)
Alkaline Phosphatase: 51 U/L (ref 38–126)
Anion gap: 8 (ref 5–15)
BUN: 30 mg/dL — ABNORMAL HIGH (ref 8–23)
CO2: 27 mmol/L (ref 22–32)
Calcium: 9.7 mg/dL (ref 8.9–10.3)
Chloride: 109 mmol/L (ref 98–111)
Creatinine: 1.24 mg/dL — ABNORMAL HIGH (ref 0.44–1.00)
GFR, Estimated: 47 mL/min — ABNORMAL LOW (ref >60)
Glucose, Bld: 114 mg/dL — ABNORMAL HIGH (ref 70–99)
Potassium: 4.3 mmol/L (ref 3.5–5.1)
Sodium: 144 mmol/L (ref 135–145)
Total Bilirubin: 0.4 mg/dL (ref 0.3–1.2)
Total Protein: 6.4 g/dL — ABNORMAL LOW (ref 6.5–8.1)

## 2020-05-24 LAB — RETICULOCYTES
Immature Retic Fract: 20.6 % — ABNORMAL HIGH (ref 2.3–15.9)
RBC.: 2.64 MIL/uL — ABNORMAL LOW (ref 3.87–5.11)
Retic Count, Absolute: 46.5 10*3/uL (ref 19.0–186.0)
Retic Ct Pct: 1.8 % (ref 0.4–3.1)

## 2020-05-24 LAB — CBC WITH DIFFERENTIAL (CANCER CENTER ONLY)
Abs Immature Granulocytes: 0.02 10*3/uL (ref 0.00–0.07)
Basophils Absolute: 0 10*3/uL (ref 0.0–0.1)
Basophils Relative: 0 %
Eosinophils Absolute: 0 10*3/uL (ref 0.0–0.5)
Eosinophils Relative: 0 %
HCT: 29.8 % — ABNORMAL LOW (ref 36.0–46.0)
Hemoglobin: 9.4 g/dL — ABNORMAL LOW (ref 12.0–15.0)
Immature Granulocytes: 0 %
Lymphocytes Relative: 35 %
Lymphs Abs: 1.7 10*3/uL (ref 0.7–4.0)
MCH: 35.5 pg — ABNORMAL HIGH (ref 26.0–34.0)
MCHC: 31.5 g/dL (ref 30.0–36.0)
MCV: 112.5 fL — ABNORMAL HIGH (ref 80.0–100.0)
Monocytes Absolute: 0.4 10*3/uL (ref 0.1–1.0)
Monocytes Relative: 9 %
Neutro Abs: 2.7 10*3/uL (ref 1.7–7.7)
Neutrophils Relative %: 56 %
Platelet Count: 122 10*3/uL — ABNORMAL LOW (ref 150–400)
RBC: 2.65 MIL/uL — ABNORMAL LOW (ref 3.87–5.11)
RDW: 16.1 % — ABNORMAL HIGH (ref 11.5–15.5)
WBC Count: 4.9 10*3/uL (ref 4.0–10.5)
nRBC: 0 % (ref 0.0–0.2)

## 2020-05-24 MED ORDER — EPOETIN ALFA-EPBX 40000 UNIT/ML IJ SOLN
40000.0000 [IU] | Freq: Once | INTRAMUSCULAR | Status: AC
  Administered 2020-05-24: 40000 [IU] via SUBCUTANEOUS

## 2020-05-24 NOTE — Patient Instructions (Signed)

## 2020-05-24 NOTE — Telephone Encounter (Signed)
Appts made and printed for pt per 05/24/20 LOS   AOM

## 2020-05-24 NOTE — Progress Notes (Signed)
Pt discharged in no apparent distress. Pt left ambulatory with cane. Pt aware of discharge instructions and verbalized understanding and had no further questions.

## 2020-05-24 NOTE — Progress Notes (Signed)
Hematology and Oncology Follow Up Visit  Helen Hardy [id number] May 05, 1951 69 y.o. 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  Unfortunately, she had a bout of ischemic colitis.  She is in the hospital for 12 days.  This is back in September and early October.  Not sure why she would have ischemic colitis.  Apparently there might be some history of Crohn's disease.  Again I am not sure if this is truly the case.  She also is dealing with a very restrictive right shoulder.  At some point, she is going to need surgery for the right shoulder and probably need to have the shoulder replaced.  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  She clearly is not a candidate for any surgery right now.  She was not transfused while she was in the hospital.  There is no problems with her bleeding right now.  Overall, I would have to say that her performance status is probably ECOG 2.    Medications:  Allergies as of 05/24/2020      Reactions   Oxycodone Nausea And Vomiting   Tramadol Other (See Comments)   UNKNOWN REACTION      Medication List       Accurate as of May 24, 2020 12:14 PM. If you have any questions, ask your nurse or doctor.        estradiol 1 MG tablet Commonly known as: ESTRACE Take 1 mg by mouth daily.   folic acid 1 MG tablet Commonly known as: FOLVITE Take 2 mg by mouth daily.   furosemide 40 MG tablet Commonly known as: LASIX Take 40 mg by mouth daily.   gabapentin 300 MG capsule Commonly known as: NEURONTIN Take 300-600 mg by mouth See admin instructions. 300 mg in the morning and at 2 pm. 600 mg every night   HYDROcodone-acetaminophen 10-325 MG  tablet Commonly known as: NORCO Take 1 tablet by mouth every 8 (eight) hours as needed for moderate pain or severe pain.   leflunomide 20 MG tablet Commonly known as: ARAVA Take 20 mg by mouth daily.   methocarbamol 500 MG tablet Commonly known as: ROBAXIN Take 750 mg by mouth in the morning and at bedtime.   multivitamin tablet Take 1 tablet by mouth daily.   olmesartan 20 MG tablet Commonly known as: BENICAR Take 20 mg by mouth daily.   ondansetron 4 MG tablet Commonly known as: ZOFRAN Take 4 mg by mouth every 8 (eight) hours as needed for nausea or vomiting.   pantoprazole 40 MG tablet Commonly known as: PROTONIX Take 40 mg by mouth daily.   Potassium Chloride ER 20 MEQ Tbcr Take 20 mEq by mouth daily.   predniSONE 10 MG tablet Commonly known as: DELTASONE Take 10 mg by mouth daily.   triamcinolone 0.1 % Commonly known as: KENALOG Apply 1 application topically as needed (for rash).   vitamin B-12 1000 MCG tablet Commonly known as: CYANOCOBALAMIN Take 1,000 mcg by mouth daily.   vitamin C 1000 MG tablet Take 1,000 mg by mouth daily.   Vitamin D3 25 MCG (1000 UT) Caps Take 2,000 Units by mouth daily.       Allergies:  Allergies  Allergen Reactions  . Oxycodone Nausea And Vomiting  . Tramadol Other (See Comments)    UNKNOWN REACTION    Past Medical History, Surgical history, Social history, and Family History were reviewed and updated.  Review of Systems: Review of Systems  Constitutional: Negative.   HENT: Negative.   Eyes: Negative.   Respiratory: Positive for shortness of breath.   Cardiovascular: Negative.   Gastrointestinal: Positive for abdominal pain, blood in stool and nausea.  Genitourinary: Negative.   Musculoskeletal: Positive for joint pain.  Skin: Negative.   Neurological: Negative.   Endo/Heme/Allergies: Negative.   Psychiatric/Behavioral: Negative.      Physical Exam:  weight is 219 lb (99.3 kg). Her oral temperature is  98.6 F (37 C). Her blood pressure is 150/77 (abnormal) and her pulse is 77. Her respiration is 19 and oxygen saturation is 100%.   Wt Readings from Last 3 Encounters:  05/24/20 219 lb (99.3 kg)  04/18/20 223 lb (101.2 kg)  04/01/20 233 lb 7.5 oz (105.9 kg)    Physical Exam Vitals reviewed.  HENT:     Head: Normocephalic and atraumatic.  Eyes:     Pupils: Pupils are equal, round, and reactive to light.  Cardiovascular:     Rate and Rhythm: Normal rate and regular rhythm.     Heart sounds: Normal heart sounds.  Pulmonary:     Effort: Pulmonary effort is normal.     Breath sounds: Normal breath sounds.  Abdominal:     General: Bowel sounds are normal.     Palpations: Abdomen is soft.  Musculoskeletal:        General: No tenderness or deformity. Normal range of motion.     Cervical back: Normal range of motion.     Comments: She has obvious arthritic deformities in her joints.  She has markedly decreased range of motion of her right shoulder.  She has a little bit of swelling in the legs.  Lymphadenopathy:     Cervical: No cervical adenopathy.  Skin:    General: Skin is warm and dry.     Findings: No erythema or rash.  Neurological:     Mental Status: She is alert and oriented to person, place, and time.  Psychiatric:        Behavior: Behavior normal.        Thought Content: Thought content normal.        Judgment: Judgment normal.      Lab Results  Component Value Date   WBC 4.9 05/24/2020   HGB 9.4 (L) 05/24/2020   HCT 29.8 (L) 05/24/2020   MCV 112.5 (H) 05/24/2020   PLT 122 (L) 05/24/2020   Lab Results  Component Value Date   FERRITIN 2,885 (H) 04/25/2020   IRON 93 04/25/2020   TIBC 235 (L) 04/25/2020   UIBC 142 04/25/2020   IRONPCTSAT 40 04/25/2020   Lab Results  Component Value Date   RETICCTPCT 1.8 05/24/2020   RBC 2.64 (L) 05/24/2020   RETICCTABS 41.0 06/21/2015   No results found for: KPAFRELGTCHN, LAMBDASER, KAPLAMBRATIO No results found for:  IGGSERUM, IGA, IGMSERUM No results found for: [name], A1GS, A2GS, Violet Baldy, MSPIKE, SPEI   Chemistry      Component Value Date/Time   NA 144 05/24/2020 1102   NA 144 04/29/2017 0929   K 4.3 05/24/2020 1102   K 4.0 04/29/2017 0929   CL 109 05/24/2020 1102   CL 103 06/21/2015 0800   CO2 27 05/24/2020 1102   CO2 25  04/29/2017 0929   BUN 30 (H) 05/24/2020 1102   BUN 17.7 04/29/2017 0929   CREATININE 1.24 (H) 05/24/2020 1102   CREATININE 0.8 04/29/2017 0929      Component Value Date/Time   CALCIUM 9.7 05/24/2020 1102   CALCIUM 9.3 04/29/2017 0929   ALKPHOS 51 05/24/2020 1102   ALKPHOS 55 04/29/2017 0929   AST 20 05/24/2020 1102   AST 19 04/29/2017 0929   ALT 18 05/24/2020 1102   ALT 16 04/29/2017 0929   BILITOT 0.4 05/24/2020 1102   BILITOT 0.43 04/29/2017 0929       Impression and Plan: Ms. Madariaga is a very pleasant 69yo African American female with 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   Thankfully, her active problems her not oncologic or hematologic.  I just not sure how much can really be done given all of her health issues.  We will go ahead and give her Retacrit today.  She has come back weekly for the Retacrit, unfortunately.  We will have to check her iron studies.  Again I am not sure where this diagnosis of Crohn's disease came from.  I would like to see her back in about 3 weeks.  Again, as there is a lot going on with her right now and we have to make sure we follow her closely.  Volanda Napoleon, MD 11/19/202112:14 PM

## 2020-05-27 DIAGNOSIS — M5416 Radiculopathy, lumbar region: Secondary | ICD-10-CM | POA: Diagnosis not present

## 2020-05-27 DIAGNOSIS — K5751 Diverticulosis of both small and large intestine without perforation or abscess with bleeding: Secondary | ICD-10-CM | POA: Diagnosis not present

## 2020-05-27 DIAGNOSIS — M1990 Unspecified osteoarthritis, unspecified site: Secondary | ICD-10-CM | POA: Diagnosis not present

## 2020-05-27 DIAGNOSIS — M069 Rheumatoid arthritis, unspecified: Secondary | ICD-10-CM | POA: Diagnosis not present

## 2020-05-27 DIAGNOSIS — K5000 Crohn's disease of small intestine without complications: Secondary | ICD-10-CM | POA: Diagnosis not present

## 2020-05-27 DIAGNOSIS — G8929 Other chronic pain: Secondary | ICD-10-CM | POA: Diagnosis not present

## 2020-05-27 LAB — IRON AND TIBC
Iron: 173 ug/dL — ABNORMAL HIGH (ref 41–142)
Saturation Ratios: 71 % — ABNORMAL HIGH (ref 21–57)
TIBC: 244 ug/dL (ref 236–444)
UIBC: 70 ug/dL — ABNORMAL LOW (ref 120–384)

## 2020-05-27 LAB — FERRITIN: Ferritin: 2131 ng/mL — ABNORMAL HIGH (ref 11–307)

## 2020-05-29 ENCOUNTER — Inpatient Hospital Stay: Payer: Medicare Other

## 2020-05-29 ENCOUNTER — Other Ambulatory Visit: Payer: Self-pay

## 2020-05-29 VITALS — BP 147/85 | HR 87 | Temp 98.6°F | Resp 19

## 2020-05-29 DIAGNOSIS — Z79899 Other long term (current) drug therapy: Secondary | ICD-10-CM | POA: Diagnosis not present

## 2020-05-29 DIAGNOSIS — D500 Iron deficiency anemia secondary to blood loss (chronic): Secondary | ICD-10-CM

## 2020-05-29 DIAGNOSIS — I129 Hypertensive chronic kidney disease with stage 1 through stage 4 chronic kidney disease, or unspecified chronic kidney disease: Secondary | ICD-10-CM | POA: Diagnosis not present

## 2020-05-29 DIAGNOSIS — K5090 Crohn's disease, unspecified, without complications: Secondary | ICD-10-CM | POA: Diagnosis not present

## 2020-05-29 DIAGNOSIS — N189 Chronic kidney disease, unspecified: Secondary | ICD-10-CM | POA: Diagnosis not present

## 2020-05-29 DIAGNOSIS — D631 Anemia in chronic kidney disease: Secondary | ICD-10-CM

## 2020-05-29 DIAGNOSIS — K909 Intestinal malabsorption, unspecified: Secondary | ICD-10-CM

## 2020-05-29 DIAGNOSIS — D509 Iron deficiency anemia, unspecified: Secondary | ICD-10-CM | POA: Diagnosis not present

## 2020-05-29 LAB — CMP (CANCER CENTER ONLY)
ALT: 19 U/L (ref 0–44)
AST: 23 U/L (ref 15–41)
Albumin: 4.2 g/dL (ref 3.5–5.0)
Alkaline Phosphatase: 50 U/L (ref 38–126)
Anion gap: 10 (ref 5–15)
BUN: 31 mg/dL — ABNORMAL HIGH (ref 8–23)
CO2: 27 mmol/L (ref 22–32)
Calcium: 9.9 mg/dL (ref 8.9–10.3)
Chloride: 105 mmol/L (ref 98–111)
Creatinine: 1.29 mg/dL — ABNORMAL HIGH (ref 0.44–1.00)
GFR, Estimated: 44 mL/min — ABNORMAL LOW (ref >60)
Glucose, Bld: 105 mg/dL — ABNORMAL HIGH (ref 70–99)
Potassium: 4.1 mmol/L (ref 3.5–5.1)
Sodium: 142 mmol/L (ref 135–145)
Total Bilirubin: 0.4 mg/dL (ref 0.3–1.2)
Total Protein: 6.6 g/dL (ref 6.5–8.1)

## 2020-05-29 LAB — CBC WITH DIFFERENTIAL (CANCER CENTER ONLY)
Abs Immature Granulocytes: 0.03 10*3/uL (ref 0.00–0.07)
Basophils Absolute: 0 10*3/uL (ref 0.0–0.1)
Basophils Relative: 0 %
Eosinophils Absolute: 0 10*3/uL (ref 0.0–0.5)
Eosinophils Relative: 0 %
HCT: 30.7 % — ABNORMAL LOW (ref 36.0–46.0)
Hemoglobin: 9.8 g/dL — ABNORMAL LOW (ref 12.0–15.0)
Immature Granulocytes: 1 %
Lymphocytes Relative: 33 %
Lymphs Abs: 1.7 10*3/uL (ref 0.7–4.0)
MCH: 35.8 pg — ABNORMAL HIGH (ref 26.0–34.0)
MCHC: 31.9 g/dL (ref 30.0–36.0)
MCV: 112 fL — ABNORMAL HIGH (ref 80.0–100.0)
Monocytes Absolute: 0.4 10*3/uL (ref 0.1–1.0)
Monocytes Relative: 8 %
Neutro Abs: 3 10*3/uL (ref 1.7–7.7)
Neutrophils Relative %: 58 %
Platelet Count: 128 10*3/uL — ABNORMAL LOW (ref 150–400)
RBC: 2.74 MIL/uL — ABNORMAL LOW (ref 3.87–5.11)
RDW: 16.2 % — ABNORMAL HIGH (ref 11.5–15.5)
WBC Count: 5.1 10*3/uL (ref 4.0–10.5)
nRBC: 1.2 % — ABNORMAL HIGH (ref 0.0–0.2)

## 2020-05-29 LAB — IRON AND TIBC
Iron: 84 ug/dL (ref 41–142)
Saturation Ratios: 32 % (ref 21–57)
TIBC: 260 ug/dL (ref 236–444)
UIBC: 176 ug/dL (ref 120–384)

## 2020-05-29 LAB — FERRITIN: Ferritin: 1965 ng/mL — ABNORMAL HIGH (ref 11–307)

## 2020-05-29 LAB — RETICULOCYTES
Immature Retic Fract: 33.7 % — ABNORMAL HIGH (ref 2.3–15.9)
RBC.: 2.81 MIL/uL — ABNORMAL LOW (ref 3.87–5.11)
Retic Count, Absolute: 73.6 10*3/uL (ref 19.0–186.0)
Retic Ct Pct: 2.6 % (ref 0.4–3.1)

## 2020-05-29 MED ORDER — EPOETIN ALFA-EPBX 40000 UNIT/ML IJ SOLN
40000.0000 [IU] | Freq: Once | INTRAMUSCULAR | Status: AC
  Administered 2020-05-29: 40000 [IU] via SUBCUTANEOUS

## 2020-05-29 NOTE — Progress Notes (Signed)
Pt discharged in no apparent distress. Pt left ambulatory with cane.Helen Hardy Pt aware of discharge instructions and verbalized understanding and had no further questions.

## 2020-05-29 NOTE — Patient Instructions (Signed)

## 2020-05-30 DIAGNOSIS — K219 Gastro-esophageal reflux disease without esophagitis: Secondary | ICD-10-CM | POA: Diagnosis not present

## 2020-05-30 DIAGNOSIS — Z7952 Long term (current) use of systemic steroids: Secondary | ICD-10-CM | POA: Diagnosis not present

## 2020-05-30 DIAGNOSIS — K5751 Diverticulosis of both small and large intestine without perforation or abscess with bleeding: Secondary | ICD-10-CM | POA: Diagnosis not present

## 2020-05-30 DIAGNOSIS — R739 Hyperglycemia, unspecified: Secondary | ICD-10-CM | POA: Diagnosis not present

## 2020-05-30 DIAGNOSIS — K5000 Crohn's disease of small intestine without complications: Secondary | ICD-10-CM | POA: Diagnosis not present

## 2020-05-30 DIAGNOSIS — Z8572 Personal history of non-Hodgkin lymphomas: Secondary | ICD-10-CM | POA: Diagnosis not present

## 2020-05-30 DIAGNOSIS — Z79899 Other long term (current) drug therapy: Secondary | ICD-10-CM | POA: Diagnosis not present

## 2020-05-30 DIAGNOSIS — M5416 Radiculopathy, lumbar region: Secondary | ICD-10-CM | POA: Diagnosis not present

## 2020-05-30 DIAGNOSIS — I129 Hypertensive chronic kidney disease with stage 1 through stage 4 chronic kidney disease, or unspecified chronic kidney disease: Secondary | ICD-10-CM | POA: Diagnosis not present

## 2020-05-30 DIAGNOSIS — M069 Rheumatoid arthritis, unspecified: Secondary | ICD-10-CM | POA: Diagnosis not present

## 2020-05-30 DIAGNOSIS — D631 Anemia in chronic kidney disease: Secondary | ICD-10-CM | POA: Diagnosis not present

## 2020-05-30 DIAGNOSIS — Z6841 Body Mass Index (BMI) 40.0 and over, adult: Secondary | ICD-10-CM | POA: Diagnosis not present

## 2020-05-30 DIAGNOSIS — M1990 Unspecified osteoarthritis, unspecified site: Secondary | ICD-10-CM | POA: Diagnosis not present

## 2020-05-30 DIAGNOSIS — G8929 Other chronic pain: Secondary | ICD-10-CM | POA: Diagnosis not present

## 2020-05-30 DIAGNOSIS — Z79891 Long term (current) use of opiate analgesic: Secondary | ICD-10-CM | POA: Diagnosis not present

## 2020-05-30 DIAGNOSIS — N183 Chronic kidney disease, stage 3 unspecified: Secondary | ICD-10-CM | POA: Diagnosis not present

## 2020-05-30 DIAGNOSIS — Z9181 History of falling: Secondary | ICD-10-CM | POA: Diagnosis not present

## 2020-05-30 DIAGNOSIS — Z90710 Acquired absence of both cervix and uterus: Secondary | ICD-10-CM | POA: Diagnosis not present

## 2020-06-03 DIAGNOSIS — M069 Rheumatoid arthritis, unspecified: Secondary | ICD-10-CM | POA: Diagnosis not present

## 2020-06-03 DIAGNOSIS — M5416 Radiculopathy, lumbar region: Secondary | ICD-10-CM | POA: Diagnosis not present

## 2020-06-03 DIAGNOSIS — K5000 Crohn's disease of small intestine without complications: Secondary | ICD-10-CM | POA: Diagnosis not present

## 2020-06-03 DIAGNOSIS — G8929 Other chronic pain: Secondary | ICD-10-CM | POA: Diagnosis not present

## 2020-06-03 DIAGNOSIS — M1990 Unspecified osteoarthritis, unspecified site: Secondary | ICD-10-CM | POA: Diagnosis not present

## 2020-06-03 DIAGNOSIS — K5751 Diverticulosis of both small and large intestine without perforation or abscess with bleeding: Secondary | ICD-10-CM | POA: Diagnosis not present

## 2020-06-05 ENCOUNTER — Inpatient Hospital Stay: Payer: Medicare Other | Attending: Hematology & Oncology

## 2020-06-05 ENCOUNTER — Inpatient Hospital Stay: Payer: Medicare Other

## 2020-06-05 ENCOUNTER — Other Ambulatory Visit: Payer: Self-pay

## 2020-06-05 VITALS — BP 123/57 | HR 85 | Temp 98.6°F

## 2020-06-05 DIAGNOSIS — M069 Rheumatoid arthritis, unspecified: Secondary | ICD-10-CM | POA: Diagnosis not present

## 2020-06-05 DIAGNOSIS — K909 Intestinal malabsorption, unspecified: Secondary | ICD-10-CM

## 2020-06-05 DIAGNOSIS — E611 Iron deficiency: Secondary | ICD-10-CM | POA: Diagnosis not present

## 2020-06-05 DIAGNOSIS — D500 Iron deficiency anemia secondary to blood loss (chronic): Secondary | ICD-10-CM

## 2020-06-05 DIAGNOSIS — D631 Anemia in chronic kidney disease: Secondary | ICD-10-CM | POA: Insufficient documentation

## 2020-06-05 DIAGNOSIS — I129 Hypertensive chronic kidney disease with stage 1 through stage 4 chronic kidney disease, or unspecified chronic kidney disease: Secondary | ICD-10-CM | POA: Insufficient documentation

## 2020-06-05 DIAGNOSIS — Z79899 Other long term (current) drug therapy: Secondary | ICD-10-CM | POA: Diagnosis not present

## 2020-06-05 DIAGNOSIS — N189 Chronic kidney disease, unspecified: Secondary | ICD-10-CM | POA: Diagnosis not present

## 2020-06-05 DIAGNOSIS — D509 Iron deficiency anemia, unspecified: Secondary | ICD-10-CM | POA: Diagnosis not present

## 2020-06-05 DIAGNOSIS — Z8572 Personal history of non-Hodgkin lymphomas: Secondary | ICD-10-CM | POA: Insufficient documentation

## 2020-06-05 LAB — CBC WITH DIFFERENTIAL (CANCER CENTER ONLY)
Abs Immature Granulocytes: 0.07 10*3/uL (ref 0.00–0.07)
Basophils Absolute: 0 10*3/uL (ref 0.0–0.1)
Basophils Relative: 0 %
Eosinophils Absolute: 0 10*3/uL (ref 0.0–0.5)
Eosinophils Relative: 0 %
HCT: 32.9 % — ABNORMAL LOW (ref 36.0–46.0)
Hemoglobin: 10.3 g/dL — ABNORMAL LOW (ref 12.0–15.0)
Immature Granulocytes: 1 %
Lymphocytes Relative: 37 %
Lymphs Abs: 2 10*3/uL (ref 0.7–4.0)
MCH: 35.6 pg — ABNORMAL HIGH (ref 26.0–34.0)
MCHC: 31.3 g/dL (ref 30.0–36.0)
MCV: 113.8 fL — ABNORMAL HIGH (ref 80.0–100.0)
Monocytes Absolute: 0.5 10*3/uL (ref 0.1–1.0)
Monocytes Relative: 10 %
Neutro Abs: 2.8 10*3/uL (ref 1.7–7.7)
Neutrophils Relative %: 52 %
Platelet Count: 122 10*3/uL — ABNORMAL LOW (ref 150–400)
RBC: 2.89 MIL/uL — ABNORMAL LOW (ref 3.87–5.11)
RDW: 17.3 % — ABNORMAL HIGH (ref 11.5–15.5)
WBC Count: 5.4 10*3/uL (ref 4.0–10.5)
nRBC: 0.7 % — ABNORMAL HIGH (ref 0.0–0.2)

## 2020-06-05 LAB — CMP (CANCER CENTER ONLY)
ALT: 19 U/L (ref 0–44)
AST: 24 U/L (ref 15–41)
Albumin: 4.1 g/dL (ref 3.5–5.0)
Alkaline Phosphatase: 63 U/L (ref 38–126)
Anion gap: 10 (ref 5–15)
BUN: 35 mg/dL — ABNORMAL HIGH (ref 8–23)
CO2: 30 mmol/L (ref 22–32)
Calcium: 9.8 mg/dL (ref 8.9–10.3)
Chloride: 102 mmol/L (ref 98–111)
Creatinine: 1.75 mg/dL — ABNORMAL HIGH (ref 0.44–1.00)
GFR, Estimated: 31 mL/min — ABNORMAL LOW (ref >60)
Glucose, Bld: 108 mg/dL — ABNORMAL HIGH (ref 70–99)
Potassium: 4.5 mmol/L (ref 3.5–5.1)
Sodium: 142 mmol/L (ref 135–145)
Total Bilirubin: 0.6 mg/dL (ref 0.3–1.2)
Total Protein: 6.6 g/dL (ref 6.5–8.1)

## 2020-06-05 LAB — RETICULOCYTES
Immature Retic Fract: 30.1 % — ABNORMAL HIGH (ref 2.3–15.9)
RBC.: 2.88 MIL/uL — ABNORMAL LOW (ref 3.87–5.11)
Retic Count, Absolute: 116.1 10*3/uL (ref 19.0–186.0)
Retic Ct Pct: 4 % — ABNORMAL HIGH (ref 0.4–3.1)

## 2020-06-05 MED ORDER — EPOETIN ALFA-EPBX 40000 UNIT/ML IJ SOLN
40000.0000 [IU] | Freq: Once | INTRAMUSCULAR | Status: AC
  Administered 2020-06-05: 40000 [IU] via SUBCUTANEOUS

## 2020-06-05 MED ORDER — SODIUM CHLORIDE 0.9 % IV SOLN
Freq: Once | INTRAVENOUS | Status: DC
  Filled 2020-06-05: qty 250

## 2020-06-05 MED ORDER — EPOETIN ALFA-EPBX 40000 UNIT/ML IJ SOLN
INTRAMUSCULAR | Status: AC
  Filled 2020-06-05: qty 1

## 2020-06-05 NOTE — Patient Instructions (Signed)

## 2020-06-05 NOTE — Progress Notes (Signed)
Patient is stable upon discharge.

## 2020-06-06 LAB — IRON AND TIBC
Iron: 85 ug/dL (ref 41–142)
Saturation Ratios: 33 % (ref 21–57)
TIBC: 259 ug/dL (ref 236–444)
UIBC: 174 ug/dL (ref 120–384)

## 2020-06-06 LAB — FERRITIN: Ferritin: 1970 ng/mL — ABNORMAL HIGH (ref 11–307)

## 2020-06-07 DIAGNOSIS — M25571 Pain in right ankle and joints of right foot: Secondary | ICD-10-CM | POA: Diagnosis not present

## 2020-06-07 DIAGNOSIS — M722 Plantar fascial fibromatosis: Secondary | ICD-10-CM | POA: Diagnosis not present

## 2020-06-12 ENCOUNTER — Inpatient Hospital Stay (HOSPITAL_BASED_OUTPATIENT_CLINIC_OR_DEPARTMENT_OTHER): Payer: Medicare Other | Admitting: Hematology & Oncology

## 2020-06-12 ENCOUNTER — Other Ambulatory Visit: Payer: Self-pay

## 2020-06-12 ENCOUNTER — Telehealth: Payer: Self-pay

## 2020-06-12 ENCOUNTER — Encounter: Payer: Self-pay | Admitting: Hematology & Oncology

## 2020-06-12 ENCOUNTER — Inpatient Hospital Stay: Payer: Medicare Other

## 2020-06-12 VITALS — BP 122/80 | HR 73 | Temp 98.2°F | Wt 212.0 lb

## 2020-06-12 DIAGNOSIS — D509 Iron deficiency anemia, unspecified: Secondary | ICD-10-CM | POA: Diagnosis not present

## 2020-06-12 DIAGNOSIS — D631 Anemia in chronic kidney disease: Secondary | ICD-10-CM

## 2020-06-12 DIAGNOSIS — N183 Chronic kidney disease, stage 3 unspecified: Secondary | ICD-10-CM | POA: Diagnosis not present

## 2020-06-12 DIAGNOSIS — Z79899 Other long term (current) drug therapy: Secondary | ICD-10-CM | POA: Diagnosis not present

## 2020-06-12 DIAGNOSIS — I129 Hypertensive chronic kidney disease with stage 1 through stage 4 chronic kidney disease, or unspecified chronic kidney disease: Secondary | ICD-10-CM | POA: Diagnosis not present

## 2020-06-12 DIAGNOSIS — D500 Iron deficiency anemia secondary to blood loss (chronic): Secondary | ICD-10-CM

## 2020-06-12 DIAGNOSIS — M069 Rheumatoid arthritis, unspecified: Secondary | ICD-10-CM | POA: Diagnosis not present

## 2020-06-12 DIAGNOSIS — N189 Chronic kidney disease, unspecified: Secondary | ICD-10-CM | POA: Diagnosis not present

## 2020-06-12 LAB — CBC WITH DIFFERENTIAL (CANCER CENTER ONLY)
Abs Immature Granulocytes: 0.05 10*3/uL (ref 0.00–0.07)
Basophils Absolute: 0 10*3/uL (ref 0.0–0.1)
Basophils Relative: 0 %
Eosinophils Absolute: 0 10*3/uL (ref 0.0–0.5)
Eosinophils Relative: 0 %
HCT: 36.8 % (ref 36.0–46.0)
Hemoglobin: 11.3 g/dL — ABNORMAL LOW (ref 12.0–15.0)
Immature Granulocytes: 1 %
Lymphocytes Relative: 35 %
Lymphs Abs: 2.1 10*3/uL (ref 0.7–4.0)
MCH: 35.6 pg — ABNORMAL HIGH (ref 26.0–34.0)
MCHC: 30.7 g/dL (ref 30.0–36.0)
MCV: 116.1 fL — ABNORMAL HIGH (ref 80.0–100.0)
Monocytes Absolute: 0.4 10*3/uL (ref 0.1–1.0)
Monocytes Relative: 6 %
Neutro Abs: 3.5 10*3/uL (ref 1.7–7.7)
Neutrophils Relative %: 58 %
Platelet Count: 153 10*3/uL (ref 150–400)
RBC: 3.17 MIL/uL — ABNORMAL LOW (ref 3.87–5.11)
RDW: 18.5 % — ABNORMAL HIGH (ref 11.5–15.5)
WBC Count: 6.1 10*3/uL (ref 4.0–10.5)
nRBC: 1.7 % — ABNORMAL HIGH (ref 0.0–0.2)

## 2020-06-12 LAB — CMP (CANCER CENTER ONLY)
ALT: 17 U/L (ref 0–44)
AST: 21 U/L (ref 15–41)
Albumin: 4.2 g/dL (ref 3.5–5.0)
Alkaline Phosphatase: 58 U/L (ref 38–126)
Anion gap: 9 (ref 5–15)
BUN: 40 mg/dL — ABNORMAL HIGH (ref 8–23)
CO2: 28 mmol/L (ref 22–32)
Calcium: 10 mg/dL (ref 8.9–10.3)
Chloride: 105 mmol/L (ref 98–111)
Creatinine: 2.05 mg/dL — ABNORMAL HIGH (ref 0.44–1.00)
GFR, Estimated: 25 mL/min — ABNORMAL LOW (ref >60)
Glucose, Bld: 165 mg/dL — ABNORMAL HIGH (ref 70–99)
Potassium: 5.4 mmol/L — ABNORMAL HIGH (ref 3.5–5.1)
Sodium: 142 mmol/L (ref 135–145)
Total Bilirubin: 0.4 mg/dL (ref 0.3–1.2)
Total Protein: 6.7 g/dL (ref 6.5–8.1)

## 2020-06-12 LAB — RETICULOCYTES
Immature Retic Fract: 32.4 % — ABNORMAL HIGH (ref 2.3–15.9)
RBC.: 3.19 MIL/uL — ABNORMAL LOW (ref 3.87–5.11)
Retic Count, Absolute: 118 10*3/uL (ref 19.0–186.0)
Retic Ct Pct: 3.7 % — ABNORMAL HIGH (ref 0.4–3.1)

## 2020-06-12 NOTE — Progress Notes (Signed)
Hematology and Oncology Follow Up Visit  Helen Hardy [id number] 11-20-1950 69 y.o. 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  Overall, she seems to be doing pretty well right now.  She is responding nicely to the Retacrit.  Her hemoglobin is now above 11.  She has had no obvious problems with colitis.  She has had no diarrhea.  Her problem continues to be the arthritis.  She has has bad osteoarthritis and rheumatoid arthritis.  She has had no problems with leg swelling.  There is been no rashes.  She has had no swollen lymph nodes.    Overall, her performance status is ECOG 1.   Medications:  Allergies as of 06/12/2020      Reactions   Oxycodone Nausea And Vomiting   Tramadol Other (See Comments)   UNKNOWN REACTION      Medication List       Accurate as of June 12, 2020  3:17 PM. If you have any questions, ask your nurse or doctor.        estradiol 1 MG tablet Commonly known as: ESTRACE Take 1 mg by mouth daily.   folic acid 1 MG tablet Commonly known as: FOLVITE Take 2 mg by mouth daily.   furosemide 40 MG tablet Commonly known as: LASIX Take 40 mg by mouth daily.   gabapentin 300 MG capsule Commonly known as: NEURONTIN Take 300-600 mg by mouth See admin instructions. 300 mg in the morning and at 2 pm. 600 mg every night   HYDROcodone-acetaminophen 10-325 MG tablet Commonly known as: NORCO Take 1 tablet by mouth every 8 (eight) hours as needed for moderate pain or severe pain.   leflunomide 20 MG tablet Commonly known as: ARAVA Take 20 mg by mouth daily.   methocarbamol 500 MG tablet Commonly known as: ROBAXIN Take 750 mg by mouth in the  morning and at bedtime.   multivitamin tablet Take 1 tablet by mouth daily.   olmesartan 20 MG tablet Commonly known as: BENICAR Take 20 mg by mouth daily.   ondansetron 4 MG tablet Commonly known as: ZOFRAN Take 4 mg by mouth every 8 (eight) hours as needed for nausea or vomiting.   pantoprazole 40 MG tablet Commonly known as: PROTONIX Take 40 mg by mouth daily.   Potassium Chloride ER 20 MEQ Tbcr Take 20 mEq by mouth daily.   predniSONE 10 MG tablet Commonly known as: DELTASONE Take 10 mg by mouth daily.   triamcinolone 0.1 % Commonly known as: KENALOG Apply 1 application topically as needed (for rash).   vitamin B-12 1000 MCG tablet Commonly known as: CYANOCOBALAMIN Take 1,000 mcg by mouth daily.   vitamin C 1000 MG tablet Take 1,000 mg by mouth daily.   Vitamin D3 25 MCG (1000 UT) Caps Take 2,000 Units by mouth daily.       Allergies:  Allergies  Allergen Reactions  . Oxycodone Nausea And Vomiting  . Tramadol Other (See Comments)    UNKNOWN REACTION    Past Medical History, Surgical history, Social history, and Family History were reviewed and updated.  Review of Systems: Review of Systems  Constitutional: Negative.   HENT: Negative.   Eyes: Negative.  Respiratory: Positive for shortness of breath.   Cardiovascular: Negative.   Gastrointestinal: Positive for abdominal pain, blood in stool and nausea.  Genitourinary: Negative.   Musculoskeletal: Positive for joint pain.  Skin: Negative.   Neurological: Negative.   Endo/Heme/Allergies: Negative.   Psychiatric/Behavioral: Negative.      Physical Exam:  vitals were not taken for this visit.   Wt Readings from Last 3 Encounters:  05/24/20 219 lb (99.3 kg)  04/18/20 223 lb (101.2 kg)  04/01/20 233 lb 7.5 oz (105.9 kg)    Physical Exam Vitals reviewed.  HENT:     Head: Normocephalic and atraumatic.  Eyes:     Pupils: Pupils are equal, round, and reactive to light.  Cardiovascular:      Rate and Rhythm: Normal rate and regular rhythm.     Heart sounds: Normal heart sounds.  Pulmonary:     Effort: Pulmonary effort is normal.     Breath sounds: Normal breath sounds.  Abdominal:     General: Bowel sounds are normal.     Palpations: Abdomen is soft.  Musculoskeletal:        General: No tenderness or deformity. Normal range of motion.     Cervical back: Normal range of motion.     Comments: She has obvious arthritic deformities in her joints.  She has markedly decreased range of motion of her right shoulder.  She has a little bit of swelling in the legs.  Lymphadenopathy:     Cervical: No cervical adenopathy.  Skin:    General: Skin is warm and dry.     Findings: No erythema or rash.  Neurological:     Mental Status: She is alert and oriented to person, place, and time.  Psychiatric:        Behavior: Behavior normal.        Thought Content: Thought content normal.        Judgment: Judgment normal.      Lab Results  Component Value Date   WBC 6.1 06/12/2020   HGB 11.3 (L) 06/12/2020   HCT 36.8 06/12/2020   MCV 116.1 (H) 06/12/2020   PLT 153 06/12/2020   Lab Results  Component Value Date   FERRITIN 1,970 (H) 06/05/2020   IRON 85 06/05/2020   TIBC 259 06/05/2020   UIBC 174 06/05/2020   IRONPCTSAT 33 06/05/2020   Lab Results  Component Value Date   RETICCTPCT 3.7 (H) 06/12/2020   RBC 3.19 (L) 06/12/2020   RETICCTABS 41.0 06/21/2015   No results found for: KPAFRELGTCHN, LAMBDASER, KAPLAMBRATIO No results found for: Kandis Cocking, IGMSERUM No results found for: Odetta Pink, SPEI   Chemistry      Component Value Date/Time   NA 142 06/12/2020 1416   NA 144 04/29/2017 0929   K 5.4 (H) 06/12/2020 1416   K 4.0 04/29/2017 0929   CL 105 06/12/2020 1416   CL 103 06/21/2015 0800   CO2 28 06/12/2020 1416   CO2 25 04/29/2017 0929   BUN 40 (H) 06/12/2020 1416   BUN 17.7 04/29/2017 0929   CREATININE 2.05  (H) 06/12/2020 1416   CREATININE 0.8 04/29/2017 0929      Component Value Date/Time   CALCIUM 10.0 06/12/2020 1416   CALCIUM 9.3 04/29/2017 0929   ALKPHOS 58 06/12/2020 1416   ALKPHOS 55 04/29/2017 0929   AST 21 06/12/2020 1416   AST 19 04/29/2017 0929   ALT 17 06/12/2020 1416   ALT 16 04/29/2017 0929  BILITOT 0.4 06/12/2020 1416   BILITOT 0.43 04/29/2017 0929       Impression and Plan: Helen Hardy is a very pleasant 69yo African American female with 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   Thankfully, her anemia is responding to the Retacrit.  I am happy about this.  Hopefully, she will not have issues with the colitis.  She will not need any Retacrit today.  I will go ahead and plan to get her back now in for 5 weeks.  We will try to hold off on the Retacrit for right now.  Volanda Napoleon, MD 12/8/20213:17 PM

## 2020-06-12 NOTE — Telephone Encounter (Signed)
appts made and printed for pt per 06/12/20 los... AOM

## 2020-06-13 LAB — IRON AND TIBC
Iron: 53 ug/dL (ref 41–142)
Saturation Ratios: 19 % — ABNORMAL LOW (ref 21–57)
TIBC: 284 ug/dL (ref 236–444)
UIBC: 231 ug/dL (ref 120–384)

## 2020-06-13 LAB — FERRITIN: Ferritin: 1875 ng/mL — ABNORMAL HIGH (ref 11–307)

## 2020-06-14 DIAGNOSIS — K5751 Diverticulosis of both small and large intestine without perforation or abscess with bleeding: Secondary | ICD-10-CM | POA: Diagnosis not present

## 2020-06-14 DIAGNOSIS — M1990 Unspecified osteoarthritis, unspecified site: Secondary | ICD-10-CM | POA: Diagnosis not present

## 2020-06-14 DIAGNOSIS — K5000 Crohn's disease of small intestine without complications: Secondary | ICD-10-CM | POA: Diagnosis not present

## 2020-06-14 DIAGNOSIS — M069 Rheumatoid arthritis, unspecified: Secondary | ICD-10-CM | POA: Diagnosis not present

## 2020-06-14 DIAGNOSIS — G8929 Other chronic pain: Secondary | ICD-10-CM | POA: Diagnosis not present

## 2020-06-14 DIAGNOSIS — M5416 Radiculopathy, lumbar region: Secondary | ICD-10-CM | POA: Diagnosis not present

## 2020-06-17 ENCOUNTER — Other Ambulatory Visit: Payer: Self-pay | Admitting: Family

## 2020-06-17 ENCOUNTER — Telehealth: Payer: Self-pay | Admitting: Family

## 2020-06-17 DIAGNOSIS — M0579 Rheumatoid arthritis with rheumatoid factor of multiple sites without organ or systems involvement: Secondary | ICD-10-CM | POA: Diagnosis not present

## 2020-06-17 NOTE — Telephone Encounter (Signed)
Called and LMVM for patient regarding iron infusion appointment that has been scheduled per 12/13 sch msg

## 2020-06-18 ENCOUNTER — Telehealth: Payer: Self-pay

## 2020-06-18 NOTE — Telephone Encounter (Signed)
Returned pts call verifying her iron tx on 12/23, she will get a print out if needed on 06/20/20.... AOM

## 2020-06-19 DIAGNOSIS — G8929 Other chronic pain: Secondary | ICD-10-CM | POA: Diagnosis not present

## 2020-06-19 DIAGNOSIS — M069 Rheumatoid arthritis, unspecified: Secondary | ICD-10-CM | POA: Diagnosis not present

## 2020-06-19 DIAGNOSIS — K5751 Diverticulosis of both small and large intestine without perforation or abscess with bleeding: Secondary | ICD-10-CM | POA: Diagnosis not present

## 2020-06-19 DIAGNOSIS — M65871 Other synovitis and tenosynovitis, right ankle and foot: Secondary | ICD-10-CM | POA: Diagnosis not present

## 2020-06-19 DIAGNOSIS — M1990 Unspecified osteoarthritis, unspecified site: Secondary | ICD-10-CM | POA: Diagnosis not present

## 2020-06-19 DIAGNOSIS — K5000 Crohn's disease of small intestine without complications: Secondary | ICD-10-CM | POA: Diagnosis not present

## 2020-06-19 DIAGNOSIS — M25571 Pain in right ankle and joints of right foot: Secondary | ICD-10-CM | POA: Diagnosis not present

## 2020-06-19 DIAGNOSIS — M5416 Radiculopathy, lumbar region: Secondary | ICD-10-CM | POA: Diagnosis not present

## 2020-06-20 ENCOUNTER — Inpatient Hospital Stay: Payer: Medicare Other

## 2020-06-20 ENCOUNTER — Other Ambulatory Visit: Payer: Self-pay

## 2020-06-20 VITALS — BP 128/81 | HR 91 | Temp 98.8°F

## 2020-06-20 DIAGNOSIS — I129 Hypertensive chronic kidney disease with stage 1 through stage 4 chronic kidney disease, or unspecified chronic kidney disease: Secondary | ICD-10-CM | POA: Diagnosis not present

## 2020-06-20 DIAGNOSIS — D509 Iron deficiency anemia, unspecified: Secondary | ICD-10-CM | POA: Diagnosis not present

## 2020-06-20 DIAGNOSIS — K909 Intestinal malabsorption, unspecified: Secondary | ICD-10-CM

## 2020-06-20 DIAGNOSIS — M069 Rheumatoid arthritis, unspecified: Secondary | ICD-10-CM | POA: Diagnosis not present

## 2020-06-20 DIAGNOSIS — D500 Iron deficiency anemia secondary to blood loss (chronic): Secondary | ICD-10-CM

## 2020-06-20 DIAGNOSIS — D631 Anemia in chronic kidney disease: Secondary | ICD-10-CM

## 2020-06-20 DIAGNOSIS — Z79899 Other long term (current) drug therapy: Secondary | ICD-10-CM | POA: Diagnosis not present

## 2020-06-20 DIAGNOSIS — N189 Chronic kidney disease, unspecified: Secondary | ICD-10-CM | POA: Diagnosis not present

## 2020-06-20 LAB — CBC WITH DIFFERENTIAL (CANCER CENTER ONLY)
Abs Immature Granulocytes: 0.04 10*3/uL (ref 0.00–0.07)
Basophils Absolute: 0 10*3/uL (ref 0.0–0.1)
Basophils Relative: 0 %
Eosinophils Absolute: 0 10*3/uL (ref 0.0–0.5)
Eosinophils Relative: 0 %
HCT: 35.5 % — ABNORMAL LOW (ref 36.0–46.0)
Hemoglobin: 11 g/dL — ABNORMAL LOW (ref 12.0–15.0)
Immature Granulocytes: 1 %
Lymphocytes Relative: 31 %
Lymphs Abs: 1.7 10*3/uL (ref 0.7–4.0)
MCH: 35.3 pg — ABNORMAL HIGH (ref 26.0–34.0)
MCHC: 31 g/dL (ref 30.0–36.0)
MCV: 113.8 fL — ABNORMAL HIGH (ref 80.0–100.0)
Monocytes Absolute: 0.5 10*3/uL (ref 0.1–1.0)
Monocytes Relative: 8 %
Neutro Abs: 3.3 10*3/uL (ref 1.7–7.7)
Neutrophils Relative %: 60 %
Platelet Count: 151 10*3/uL (ref 150–400)
RBC: 3.12 MIL/uL — ABNORMAL LOW (ref 3.87–5.11)
RDW: 17.2 % — ABNORMAL HIGH (ref 11.5–15.5)
WBC Count: 5.5 10*3/uL (ref 4.0–10.5)
nRBC: 0 % (ref 0.0–0.2)

## 2020-06-20 LAB — CMP (CANCER CENTER ONLY)
ALT: 16 U/L (ref 0–44)
AST: 20 U/L (ref 15–41)
Albumin: 4.2 g/dL (ref 3.5–5.0)
Alkaline Phosphatase: 59 U/L (ref 38–126)
Anion gap: 11 (ref 5–15)
BUN: 45 mg/dL — ABNORMAL HIGH (ref 8–23)
CO2: 26 mmol/L (ref 22–32)
Calcium: 10.4 mg/dL — ABNORMAL HIGH (ref 8.9–10.3)
Chloride: 106 mmol/L (ref 98–111)
Creatinine: 1.74 mg/dL — ABNORMAL HIGH (ref 0.44–1.00)
GFR, Estimated: 31 mL/min — ABNORMAL LOW (ref >60)
Glucose, Bld: 103 mg/dL — ABNORMAL HIGH (ref 70–99)
Potassium: 4.9 mmol/L (ref 3.5–5.1)
Sodium: 143 mmol/L (ref 135–145)
Total Bilirubin: 0.5 mg/dL (ref 0.3–1.2)
Total Protein: 6.6 g/dL (ref 6.5–8.1)

## 2020-06-20 LAB — RETICULOCYTES
Immature Retic Fract: 17.2 % — ABNORMAL HIGH (ref 2.3–15.9)
RBC.: 3.09 MIL/uL — ABNORMAL LOW (ref 3.87–5.11)
Retic Count, Absolute: 64.6 10*3/uL (ref 19.0–186.0)
Retic Ct Pct: 2.1 % (ref 0.4–3.1)

## 2020-06-20 MED ORDER — SODIUM CHLORIDE 0.9 % IV SOLN
510.0000 mg | Freq: Once | INTRAVENOUS | Status: AC
  Administered 2020-06-20: 510 mg via INTRAVENOUS
  Filled 2020-06-20: qty 17

## 2020-06-20 MED ORDER — SODIUM CHLORIDE 0.9 % IV SOLN
INTRAVENOUS | Status: DC
  Filled 2020-06-20: qty 250

## 2020-06-20 NOTE — Progress Notes (Signed)
Pt discharged in no apparent distress. Pt left ambulatory without assistance. Pt aware of discharge instructions and verbalized understanding and had no further questions.  

## 2020-06-20 NOTE — Patient Instructions (Signed)
Pt discharged in no apparent distress. Pt left ambulatory without assistance. Pt aware of discharge instructions and verbalized understanding and had no further questions.  

## 2020-06-20 NOTE — Patient Instructions (Signed)

## 2020-06-21 LAB — IRON AND TIBC
Iron: 153 ug/dL — ABNORMAL HIGH (ref 41–142)
Saturation Ratios: 56 % (ref 21–57)
TIBC: 274 ug/dL (ref 236–444)
UIBC: 121 ug/dL (ref 120–384)

## 2020-06-21 LAB — FERRITIN: Ferritin: 2321 ng/mL — ABNORMAL HIGH (ref 11–307)

## 2020-06-27 ENCOUNTER — Other Ambulatory Visit: Payer: Medicare Other

## 2020-06-27 ENCOUNTER — Ambulatory Visit: Payer: Medicare Other

## 2020-06-27 ENCOUNTER — Inpatient Hospital Stay: Payer: Medicare Other

## 2020-07-01 ENCOUNTER — Other Ambulatory Visit: Payer: Self-pay | Admitting: Obstetrics and Gynecology

## 2020-07-01 DIAGNOSIS — R432 Parageusia: Secondary | ICD-10-CM | POA: Diagnosis not present

## 2020-07-01 DIAGNOSIS — F439 Reaction to severe stress, unspecified: Secondary | ICD-10-CM | POA: Diagnosis not present

## 2020-07-01 DIAGNOSIS — Z1231 Encounter for screening mammogram for malignant neoplasm of breast: Secondary | ICD-10-CM

## 2020-07-04 ENCOUNTER — Inpatient Hospital Stay: Payer: Medicare Other

## 2020-07-04 ENCOUNTER — Other Ambulatory Visit: Payer: Self-pay

## 2020-07-04 VITALS — BP 139/77 | HR 82 | Temp 97.9°F | Resp 17

## 2020-07-04 DIAGNOSIS — M069 Rheumatoid arthritis, unspecified: Secondary | ICD-10-CM | POA: Diagnosis not present

## 2020-07-04 DIAGNOSIS — D509 Iron deficiency anemia, unspecified: Secondary | ICD-10-CM | POA: Diagnosis not present

## 2020-07-04 DIAGNOSIS — N189 Chronic kidney disease, unspecified: Secondary | ICD-10-CM | POA: Diagnosis not present

## 2020-07-04 DIAGNOSIS — D631 Anemia in chronic kidney disease: Secondary | ICD-10-CM | POA: Diagnosis not present

## 2020-07-04 DIAGNOSIS — Z79899 Other long term (current) drug therapy: Secondary | ICD-10-CM | POA: Diagnosis not present

## 2020-07-04 DIAGNOSIS — D500 Iron deficiency anemia secondary to blood loss (chronic): Secondary | ICD-10-CM

## 2020-07-04 DIAGNOSIS — I129 Hypertensive chronic kidney disease with stage 1 through stage 4 chronic kidney disease, or unspecified chronic kidney disease: Secondary | ICD-10-CM | POA: Diagnosis not present

## 2020-07-04 DIAGNOSIS — K909 Intestinal malabsorption, unspecified: Secondary | ICD-10-CM

## 2020-07-04 DIAGNOSIS — N183 Chronic kidney disease, stage 3 unspecified: Secondary | ICD-10-CM

## 2020-07-04 LAB — CMP (CANCER CENTER ONLY)
ALT: 15 U/L (ref 0–44)
AST: 20 U/L (ref 15–41)
Albumin: 3.8 g/dL (ref 3.5–5.0)
Alkaline Phosphatase: 50 U/L (ref 38–126)
Anion gap: 9 (ref 5–15)
BUN: 23 mg/dL (ref 8–23)
CO2: 24 mmol/L (ref 22–32)
Calcium: 9.4 mg/dL (ref 8.9–10.3)
Chloride: 109 mmol/L (ref 98–111)
Creatinine: 1.12 mg/dL — ABNORMAL HIGH (ref 0.44–1.00)
GFR, Estimated: 53 mL/min — ABNORMAL LOW (ref >60)
Glucose, Bld: 106 mg/dL — ABNORMAL HIGH (ref 70–99)
Potassium: 3.7 mmol/L (ref 3.5–5.1)
Sodium: 142 mmol/L (ref 135–145)
Total Bilirubin: 0.3 mg/dL (ref 0.3–1.2)
Total Protein: 6 g/dL — ABNORMAL LOW (ref 6.5–8.1)

## 2020-07-04 LAB — CBC WITH DIFFERENTIAL (CANCER CENTER ONLY)
Abs Immature Granulocytes: 0.05 10*3/uL (ref 0.00–0.07)
Basophils Absolute: 0 10*3/uL (ref 0.0–0.1)
Basophils Relative: 0 %
Eosinophils Absolute: 0 10*3/uL (ref 0.0–0.5)
Eosinophils Relative: 1 %
HCT: 31.3 % — ABNORMAL LOW (ref 36.0–46.0)
Hemoglobin: 10.3 g/dL — ABNORMAL LOW (ref 12.0–15.0)
Immature Granulocytes: 1 %
Lymphocytes Relative: 32 %
Lymphs Abs: 2 10*3/uL (ref 0.7–4.0)
MCH: 36 pg — ABNORMAL HIGH (ref 26.0–34.0)
MCHC: 32.9 g/dL (ref 30.0–36.0)
MCV: 109.4 fL — ABNORMAL HIGH (ref 80.0–100.0)
Monocytes Absolute: 0.5 10*3/uL (ref 0.1–1.0)
Monocytes Relative: 9 %
Neutro Abs: 3.5 10*3/uL (ref 1.7–7.7)
Neutrophils Relative %: 57 %
Platelet Count: 133 10*3/uL — ABNORMAL LOW (ref 150–400)
RBC: 2.86 MIL/uL — ABNORMAL LOW (ref 3.87–5.11)
RDW: 16 % — ABNORMAL HIGH (ref 11.5–15.5)
WBC Count: 6.1 10*3/uL (ref 4.0–10.5)
nRBC: 0 % (ref 0.0–0.2)

## 2020-07-04 LAB — RETICULOCYTES
Immature Retic Fract: 25.3 % — ABNORMAL HIGH (ref 2.3–15.9)
RBC.: 2.86 MIL/uL — ABNORMAL LOW (ref 3.87–5.11)
Retic Count, Absolute: 52.1 10*3/uL (ref 19.0–186.0)
Retic Ct Pct: 1.8 % (ref 0.4–3.1)

## 2020-07-04 MED ORDER — EPOETIN ALFA-EPBX 40000 UNIT/ML IJ SOLN
40000.0000 [IU] | Freq: Once | INTRAMUSCULAR | Status: AC
  Administered 2020-07-04: 40000 [IU] via SUBCUTANEOUS

## 2020-07-04 MED ORDER — EPOETIN ALFA-EPBX 40000 UNIT/ML IJ SOLN
INTRAMUSCULAR | Status: AC
  Filled 2020-07-04: qty 1

## 2020-07-04 NOTE — Patient Instructions (Signed)

## 2020-07-08 LAB — IRON AND TIBC
Iron: 150 ug/dL — ABNORMAL HIGH (ref 41–142)
Saturation Ratios: 70 % — ABNORMAL HIGH (ref 21–57)
TIBC: 213 ug/dL — ABNORMAL LOW (ref 236–444)
UIBC: 63 ug/dL — ABNORMAL LOW (ref 120–384)

## 2020-07-08 LAB — FERRITIN: Ferritin: 3057 ng/mL — ABNORMAL HIGH (ref 11–307)

## 2020-07-11 DIAGNOSIS — Z5181 Encounter for therapeutic drug level monitoring: Secondary | ICD-10-CM | POA: Diagnosis not present

## 2020-07-11 DIAGNOSIS — Z79899 Other long term (current) drug therapy: Secondary | ICD-10-CM | POA: Diagnosis not present

## 2020-07-11 DIAGNOSIS — M545 Low back pain, unspecified: Secondary | ICD-10-CM | POA: Diagnosis not present

## 2020-07-11 DIAGNOSIS — M79671 Pain in right foot: Secondary | ICD-10-CM | POA: Diagnosis not present

## 2020-07-11 DIAGNOSIS — M5416 Radiculopathy, lumbar region: Secondary | ICD-10-CM | POA: Diagnosis not present

## 2020-07-16 ENCOUNTER — Encounter: Payer: Self-pay | Admitting: Hematology & Oncology

## 2020-07-16 DIAGNOSIS — M0579 Rheumatoid arthritis with rheumatoid factor of multiple sites without organ or systems involvement: Secondary | ICD-10-CM | POA: Diagnosis not present

## 2020-07-17 ENCOUNTER — Other Ambulatory Visit: Payer: Self-pay | Admitting: *Deleted

## 2020-07-17 DIAGNOSIS — D500 Iron deficiency anemia secondary to blood loss (chronic): Secondary | ICD-10-CM

## 2020-07-18 ENCOUNTER — Other Ambulatory Visit: Payer: Self-pay

## 2020-07-18 ENCOUNTER — Inpatient Hospital Stay (HOSPITAL_BASED_OUTPATIENT_CLINIC_OR_DEPARTMENT_OTHER): Payer: Medicare Other | Admitting: Family

## 2020-07-18 ENCOUNTER — Encounter: Payer: Self-pay | Admitting: Family

## 2020-07-18 ENCOUNTER — Inpatient Hospital Stay: Payer: Medicare Other | Attending: Hematology & Oncology

## 2020-07-18 ENCOUNTER — Inpatient Hospital Stay: Payer: Medicare Other

## 2020-07-18 ENCOUNTER — Telehealth: Payer: Self-pay

## 2020-07-18 VITALS — BP 141/72 | HR 86 | Temp 97.8°F | Resp 17 | Ht 60.0 in | Wt 214.0 lb

## 2020-07-18 DIAGNOSIS — M7989 Other specified soft tissue disorders: Secondary | ICD-10-CM | POA: Diagnosis not present

## 2020-07-18 DIAGNOSIS — N183 Chronic kidney disease, stage 3 unspecified: Secondary | ICD-10-CM

## 2020-07-18 DIAGNOSIS — Z79899 Other long term (current) drug therapy: Secondary | ICD-10-CM | POA: Insufficient documentation

## 2020-07-18 DIAGNOSIS — D509 Iron deficiency anemia, unspecified: Secondary | ICD-10-CM | POA: Insufficient documentation

## 2020-07-18 DIAGNOSIS — D631 Anemia in chronic kidney disease: Secondary | ICD-10-CM

## 2020-07-18 DIAGNOSIS — Z8572 Personal history of non-Hodgkin lymphomas: Secondary | ICD-10-CM | POA: Diagnosis not present

## 2020-07-18 DIAGNOSIS — D500 Iron deficiency anemia secondary to blood loss (chronic): Secondary | ICD-10-CM

## 2020-07-18 LAB — COMPREHENSIVE METABOLIC PANEL
ALT: 17 U/L (ref 0–44)
AST: 22 U/L (ref 15–41)
Albumin: 4.4 g/dL (ref 3.5–5.0)
Alkaline Phosphatase: 63 U/L (ref 38–126)
Anion gap: 9 (ref 5–15)
BUN: 36 mg/dL — ABNORMAL HIGH (ref 8–23)
CO2: 27 mmol/L (ref 22–32)
Calcium: 9.9 mg/dL (ref 8.9–10.3)
Chloride: 103 mmol/L (ref 98–111)
Creatinine, Ser: 1.55 mg/dL — ABNORMAL HIGH (ref 0.44–1.00)
GFR, Estimated: 36 mL/min — ABNORMAL LOW (ref >60)
Glucose, Bld: 102 mg/dL — ABNORMAL HIGH (ref 70–99)
Potassium: 4.6 mmol/L (ref 3.5–5.1)
Sodium: 139 mmol/L (ref 135–145)
Total Bilirubin: 0.5 mg/dL (ref 0.3–1.2)
Total Protein: 6.8 g/dL (ref 6.5–8.1)

## 2020-07-18 LAB — CBC WITH DIFFERENTIAL (CANCER CENTER ONLY)
Abs Immature Granulocytes: 0.02 10*3/uL (ref 0.00–0.07)
Basophils Absolute: 0 10*3/uL (ref 0.0–0.1)
Basophils Relative: 0 %
Eosinophils Absolute: 0.1 10*3/uL (ref 0.0–0.5)
Eosinophils Relative: 1 %
HCT: 35.1 % — ABNORMAL LOW (ref 36.0–46.0)
Hemoglobin: 11 g/dL — ABNORMAL LOW (ref 12.0–15.0)
Immature Granulocytes: 0 %
Lymphocytes Relative: 36 %
Lymphs Abs: 2.9 10*3/uL (ref 0.7–4.0)
MCH: 35 pg — ABNORMAL HIGH (ref 26.0–34.0)
MCHC: 31.3 g/dL (ref 30.0–36.0)
MCV: 111.8 fL — ABNORMAL HIGH (ref 80.0–100.0)
Monocytes Absolute: 1 10*3/uL (ref 0.1–1.0)
Monocytes Relative: 12 %
Neutro Abs: 4.2 10*3/uL (ref 1.7–7.7)
Neutrophils Relative %: 51 %
Platelet Count: 128 10*3/uL — ABNORMAL LOW (ref 150–400)
RBC: 3.14 MIL/uL — ABNORMAL LOW (ref 3.87–5.11)
RDW: 17.7 % — ABNORMAL HIGH (ref 11.5–15.5)
WBC Count: 8.2 10*3/uL (ref 4.0–10.5)
nRBC: 0 % (ref 0.0–0.2)

## 2020-07-18 NOTE — Telephone Encounter (Signed)
appts made and printed for pt  Per 07/18/20 los   aom

## 2020-07-18 NOTE — Progress Notes (Signed)
Hematology and Oncology Follow Up Visit  Helen Hardy [id number] 04-24-1951 70 y.o. 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 She is doing fairly well but her RA has been an issue with the cold weather.  She has recently gotten a steroid in her lower back as well as both of her ankles.  She has an appointment with a new podiatrist on 1/192022 and is hopping they can build her a special shoe.  No falls or syncope to report.  She has mild swelling in both ankles. Pedal pulses ae 2+.  No falls or syncope.  No fever, chills, n/v, cough, rash, dizziness, SOB, chest pain, palpitations, abdominal pain or changes in bowel or bladder habits.  No blood loss noted. No abnormal bruising, no petechiae.  Hgb is stable at 11.0, MCV 111, WBC count 8.2 and platelets 128.   ECOG Performance Status: 1 - Symptomatic but completely ambulatory  Medications:  Allergies as of 07/18/2020      Reactions   Oxycodone Nausea And Vomiting   Tramadol Other (See Comments)   UNKNOWN REACTION      Medication List       Accurate as of July 18, 2020 12:11 PM. If you have any questions, ask your nurse or doctor.        estradiol 1 MG tablet Commonly known as: ESTRACE Take 1 mg by mouth daily.   folic acid 1 MG tablet Commonly known as: FOLVITE Take 2 mg by mouth daily.   furosemide 40 MG tablet Commonly known as: LASIX Take 40 mg by mouth daily.   gabapentin 300 MG capsule Commonly known as: NEURONTIN Take 300-600 mg by mouth See admin instructions. 300 mg in the morning and at 2 pm. 600 mg every night   HYDROcodone-acetaminophen 10-325 MG  tablet Commonly known as: NORCO Take 1 tablet by mouth every 8 (eight) hours as needed for moderate pain or severe pain.   leflunomide 20 MG tablet Commonly known as: ARAVA Take 20 mg by mouth daily.   methocarbamol 500 MG tablet Commonly known as: ROBAXIN Take 750 mg by mouth in the morning and at bedtime.   multivitamin tablet Take 1 tablet by mouth daily.   olmesartan 20 MG tablet Commonly known as: BENICAR Take 20 mg by mouth daily.   ondansetron 4 MG tablet Commonly known as: ZOFRAN Take 4 mg by mouth every 8 (eight) hours as needed for nausea or vomiting.   Orencia 250 MG injection Generic drug: abatacept See admin instructions.   pantoprazole 40 MG tablet Commonly known as: PROTONIX Take 40 mg by mouth daily.   Potassium Chloride ER 20 MEQ Tbcr Take 20 mEq by mouth daily.   predniSONE 10 MG tablet Commonly known as: DELTASONE Take 10 mg by mouth daily.   triamcinolone 0.1 % Commonly known as: KENALOG Apply 1 application topically as needed (for rash).   vitamin B-12 1000 MCG tablet Commonly known as: CYANOCOBALAMIN Take 1,000 mcg by mouth daily.   vitamin C 1000 MG tablet Take 1,000 mg by mouth daily.   Vitamin D3 25 MCG (1000 UT) Caps Take 2,000 Units by mouth daily.  Allergies:  Allergies  Allergen Reactions  . Oxycodone Nausea And Vomiting  . Tramadol Other (See Comments)    UNKNOWN REACTION    Past Medical History, Surgical history, Social history, and Family History were reviewed and updated.  Review of Systems: All other 10 point review of systems is negative.   Physical Exam:  height is 5' (1.524 m) and weight is 214 lb (97.1 kg). Her oral temperature is 97.8 F (36.6 C). Her blood pressure is 141/72 (abnormal) and her pulse is 86. Her respiration is 17 and oxygen saturation is 98%.   Wt Readings from Last 3 Encounters:  07/18/20 214 lb (97.1 kg)  06/12/20 212 lb (96.2 kg)  05/24/20 219 lb (99.3 kg)    Ocular: Sclerae  unicteric, pupils equal, round and reactive to light Ear-nose-throat: Oropharynx clear, dentition fair Lymphatic: No cervical or supraclavicular adenopathy Lungs no rales or rhonchi, good excursion bilaterally Heart regular rate and rhythm, no murmur appreciated Abd soft, nontender, positive bowel sounds MSK no focal spinal tenderness, no joint edema Neuro: non-focal, well-oriented, appropriate affect Breasts: Deferred   Lab Results  Component Value Date   WBC 8.2 07/18/2020   HGB 11.0 (L) 07/18/2020   HCT 35.1 (L) 07/18/2020   MCV 111.8 (H) 07/18/2020   PLT 128 (L) 07/18/2020   Lab Results  Component Value Date   FERRITIN 3,057 (H) 07/04/2020   IRON 150 (H) 07/04/2020   TIBC 213 (L) 07/04/2020   UIBC 63 (L) 07/04/2020   IRONPCTSAT 70 (H) 07/04/2020   Lab Results  Component Value Date   RETICCTPCT 1.8 07/04/2020   RBC 3.14 (L) 07/18/2020   RETICCTABS 41.0 06/21/2015   No results found for: KPAFRELGTCHN, LAMBDASER, KAPLAMBRATIO No results found for: Kandis Cocking, IGMSERUM No results found for: Odetta Pink, SPEI   Chemistry      Component Value Date/Time   NA 139 07/18/2020 1132   NA 144 04/29/2017 0929   K 4.6 07/18/2020 1132   K 4.0 04/29/2017 0929   CL 103 07/18/2020 1132   CL 103 06/21/2015 0800   CO2 27 07/18/2020 1132   CO2 25 04/29/2017 0929   BUN 36 (H) 07/18/2020 1132   BUN 17.7 04/29/2017 0929   CREATININE 1.55 (H) 07/18/2020 1132   CREATININE 1.12 (H) 07/04/2020 1100   CREATININE 0.8 04/29/2017 0929      Component Value Date/Time   CALCIUM 9.9 07/18/2020 1132   CALCIUM 9.3 04/29/2017 0929   ALKPHOS 63 07/18/2020 1132   ALKPHOS 55 04/29/2017 0929   AST 22 07/18/2020 1132   AST 20 07/04/2020 1100   AST 19 04/29/2017 0929   ALT 17 07/18/2020 1132   ALT 15 07/04/2020 1100   ALT 16 04/29/2017 0929   BILITOT 0.5 07/18/2020 1132   BILITOT 0.3 07/04/2020 1100   BILITOT 0.43 04/29/2017 0929        Impression and Plan: Helen Hardy is a very pleasant 70yo African American female with 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  She also has multifactorial anemia.  Hgb is stable at 11.0, no ESA needed this visit.  Iron studies are pending. We will replace if needed.  Lab and injection in 3 weeks follow-up in 6 weeks.  She can contact our office with any questions or concerns.   Laverna Peace, NP 1/13/202212:11 PM

## 2020-07-19 LAB — FERRITIN: Ferritin: 3433 ng/mL — ABNORMAL HIGH (ref 11–307)

## 2020-07-19 LAB — IRON AND TIBC
Iron: 75 ug/dL (ref 41–142)
Saturation Ratios: 29 % (ref 21–57)
TIBC: 256 ug/dL (ref 236–444)
UIBC: 182 ug/dL (ref 120–384)

## 2020-08-01 DIAGNOSIS — M79671 Pain in right foot: Secondary | ICD-10-CM | POA: Diagnosis not present

## 2020-08-01 DIAGNOSIS — K5000 Crohn's disease of small intestine without complications: Secondary | ICD-10-CM | POA: Diagnosis not present

## 2020-08-01 DIAGNOSIS — M19071 Primary osteoarthritis, right ankle and foot: Secondary | ICD-10-CM | POA: Diagnosis not present

## 2020-08-01 DIAGNOSIS — M069 Rheumatoid arthritis, unspecified: Secondary | ICD-10-CM | POA: Diagnosis not present

## 2020-08-01 DIAGNOSIS — K5751 Diverticulosis of both small and large intestine without perforation or abscess with bleeding: Secondary | ICD-10-CM | POA: Diagnosis not present

## 2020-08-01 DIAGNOSIS — M67961 Unspecified disorder of synovium and tendon, right lower leg: Secondary | ICD-10-CM | POA: Diagnosis not present

## 2020-08-01 DIAGNOSIS — M25571 Pain in right ankle and joints of right foot: Secondary | ICD-10-CM | POA: Diagnosis not present

## 2020-08-08 ENCOUNTER — Other Ambulatory Visit: Payer: Self-pay

## 2020-08-08 ENCOUNTER — Ambulatory Visit
Admission: RE | Admit: 2020-08-08 | Discharge: 2020-08-08 | Disposition: A | Discharge status: Home / Self Care | Payer: Medicare Other | Source: Ambulatory Visit | Attending: Obstetrics and Gynecology | Admitting: Obstetrics and Gynecology

## 2020-08-08 ENCOUNTER — Inpatient Hospital Stay: Payer: Medicare Other

## 2020-08-08 ENCOUNTER — Inpatient Hospital Stay: Payer: Medicare Other | Attending: Hematology & Oncology

## 2020-08-08 VITALS — BP 119/62 | HR 77 | Temp 98.5°F | Resp 22

## 2020-08-08 DIAGNOSIS — G629 Polyneuropathy, unspecified: Secondary | ICD-10-CM | POA: Insufficient documentation

## 2020-08-08 DIAGNOSIS — K5090 Crohn's disease, unspecified, without complications: Secondary | ICD-10-CM | POA: Diagnosis not present

## 2020-08-08 DIAGNOSIS — D500 Iron deficiency anemia secondary to blood loss (chronic): Secondary | ICD-10-CM | POA: Diagnosis not present

## 2020-08-08 DIAGNOSIS — N183 Chronic kidney disease, stage 3 unspecified: Secondary | ICD-10-CM | POA: Diagnosis not present

## 2020-08-08 DIAGNOSIS — Z1231 Encounter for screening mammogram for malignant neoplasm of breast: Secondary | ICD-10-CM | POA: Diagnosis not present

## 2020-08-08 DIAGNOSIS — Z79899 Other long term (current) drug therapy: Secondary | ICD-10-CM | POA: Insufficient documentation

## 2020-08-08 DIAGNOSIS — I129 Hypertensive chronic kidney disease with stage 1 through stage 4 chronic kidney disease, or unspecified chronic kidney disease: Secondary | ICD-10-CM | POA: Insufficient documentation

## 2020-08-08 DIAGNOSIS — R5383 Other fatigue: Secondary | ICD-10-CM | POA: Insufficient documentation

## 2020-08-08 DIAGNOSIS — E611 Iron deficiency: Secondary | ICD-10-CM | POA: Diagnosis not present

## 2020-08-08 DIAGNOSIS — D631 Anemia in chronic kidney disease: Secondary | ICD-10-CM | POA: Diagnosis not present

## 2020-08-08 DIAGNOSIS — Z8572 Personal history of non-Hodgkin lymphomas: Secondary | ICD-10-CM | POA: Insufficient documentation

## 2020-08-08 DIAGNOSIS — K909 Intestinal malabsorption, unspecified: Secondary | ICD-10-CM

## 2020-08-08 LAB — CBC WITH DIFFERENTIAL (CANCER CENTER ONLY)
Abs Immature Granulocytes: 0.05 10*3/uL (ref 0.00–0.07)
Basophils Absolute: 0 10*3/uL (ref 0.0–0.1)
Basophils Relative: 0 %
Eosinophils Absolute: 0.1 10*3/uL (ref 0.0–0.5)
Eosinophils Relative: 1 %
HCT: 31 % — ABNORMAL LOW (ref 36.0–46.0)
Hemoglobin: 9.9 g/dL — ABNORMAL LOW (ref 12.0–15.0)
Immature Granulocytes: 1 %
Lymphocytes Relative: 42 %
Lymphs Abs: 2.9 10*3/uL (ref 0.7–4.0)
MCH: 35.6 pg — ABNORMAL HIGH (ref 26.0–34.0)
MCHC: 31.9 g/dL (ref 30.0–36.0)
MCV: 111.5 fL — ABNORMAL HIGH (ref 80.0–100.0)
Monocytes Absolute: 0.7 10*3/uL (ref 0.1–1.0)
Monocytes Relative: 9 %
Neutro Abs: 3.3 10*3/uL (ref 1.7–7.7)
Neutrophils Relative %: 47 %
Platelet Count: 148 10*3/uL — ABNORMAL LOW (ref 150–400)
RBC: 2.78 MIL/uL — ABNORMAL LOW (ref 3.87–5.11)
RDW: 16 % — ABNORMAL HIGH (ref 11.5–15.5)
WBC Count: 7 10*3/uL (ref 4.0–10.5)
nRBC: 0 % (ref 0.0–0.2)

## 2020-08-08 LAB — CMP (CANCER CENTER ONLY)
ALT: 13 U/L (ref 0–44)
AST: 18 U/L (ref 15–41)
Albumin: 4 g/dL (ref 3.5–5.0)
Alkaline Phosphatase: 49 U/L (ref 38–126)
Anion gap: 8 (ref 5–15)
BUN: 36 mg/dL — ABNORMAL HIGH (ref 8–23)
CO2: 29 mmol/L (ref 22–32)
Calcium: 10 mg/dL (ref 8.9–10.3)
Chloride: 107 mmol/L (ref 98–111)
Creatinine: 1.43 mg/dL — ABNORMAL HIGH (ref 0.44–1.00)
GFR, Estimated: 39 mL/min — ABNORMAL LOW (ref >60)
Glucose, Bld: 106 mg/dL — ABNORMAL HIGH (ref 70–99)
Potassium: 4.7 mmol/L (ref 3.5–5.1)
Sodium: 144 mmol/L (ref 135–145)
Total Bilirubin: 0.5 mg/dL (ref 0.3–1.2)
Total Protein: 6.2 g/dL — ABNORMAL LOW (ref 6.5–8.1)

## 2020-08-08 MED ORDER — EPOETIN ALFA-EPBX 40000 UNIT/ML IJ SOLN
INTRAMUSCULAR | Status: AC
  Filled 2020-08-08: qty 1

## 2020-08-08 MED ORDER — EPOETIN ALFA-EPBX 40000 UNIT/ML IJ SOLN
40000.0000 [IU] | Freq: Once | INTRAMUSCULAR | Status: AC
  Administered 2020-08-08: 40000 [IU] via SUBCUTANEOUS

## 2020-08-08 NOTE — Patient Instructions (Signed)

## 2020-08-13 DIAGNOSIS — M0579 Rheumatoid arthritis with rheumatoid factor of multiple sites without organ or systems involvement: Secondary | ICD-10-CM | POA: Diagnosis not present

## 2020-08-19 DIAGNOSIS — M67961 Unspecified disorder of synovium and tendon, right lower leg: Secondary | ICD-10-CM | POA: Diagnosis not present

## 2020-08-19 DIAGNOSIS — M19071 Primary osteoarthritis, right ankle and foot: Secondary | ICD-10-CM | POA: Diagnosis not present

## 2020-08-27 DIAGNOSIS — H02834 Dermatochalasis of left upper eyelid: Secondary | ICD-10-CM | POA: Diagnosis not present

## 2020-08-27 DIAGNOSIS — H18413 Arcus senilis, bilateral: Secondary | ICD-10-CM | POA: Diagnosis not present

## 2020-08-27 DIAGNOSIS — Z961 Presence of intraocular lens: Secondary | ICD-10-CM | POA: Diagnosis not present

## 2020-08-27 DIAGNOSIS — H02831 Dermatochalasis of right upper eyelid: Secondary | ICD-10-CM | POA: Diagnosis not present

## 2020-08-29 ENCOUNTER — Other Ambulatory Visit: Payer: Self-pay

## 2020-08-29 ENCOUNTER — Encounter: Payer: Self-pay | Admitting: Family

## 2020-08-29 ENCOUNTER — Inpatient Hospital Stay: Payer: Medicare Other

## 2020-08-29 ENCOUNTER — Telehealth: Payer: Self-pay

## 2020-08-29 ENCOUNTER — Inpatient Hospital Stay (HOSPITAL_BASED_OUTPATIENT_CLINIC_OR_DEPARTMENT_OTHER): Payer: Medicare Other | Admitting: Family

## 2020-08-29 VITALS — BP 140/63 | HR 77 | Temp 98.9°F | Resp 18 | Ht 60.0 in | Wt 225.0 lb

## 2020-08-29 DIAGNOSIS — K5090 Crohn's disease, unspecified, without complications: Secondary | ICD-10-CM | POA: Diagnosis not present

## 2020-08-29 DIAGNOSIS — N183 Chronic kidney disease, stage 3 unspecified: Secondary | ICD-10-CM

## 2020-08-29 DIAGNOSIS — K909 Intestinal malabsorption, unspecified: Secondary | ICD-10-CM

## 2020-08-29 DIAGNOSIS — D500 Iron deficiency anemia secondary to blood loss (chronic): Secondary | ICD-10-CM

## 2020-08-29 DIAGNOSIS — D631 Anemia in chronic kidney disease: Secondary | ICD-10-CM

## 2020-08-29 DIAGNOSIS — D510 Vitamin B12 deficiency anemia due to intrinsic factor deficiency: Secondary | ICD-10-CM

## 2020-08-29 DIAGNOSIS — Z79899 Other long term (current) drug therapy: Secondary | ICD-10-CM | POA: Diagnosis not present

## 2020-08-29 DIAGNOSIS — I129 Hypertensive chronic kidney disease with stage 1 through stage 4 chronic kidney disease, or unspecified chronic kidney disease: Secondary | ICD-10-CM | POA: Diagnosis not present

## 2020-08-29 DIAGNOSIS — E611 Iron deficiency: Secondary | ICD-10-CM | POA: Diagnosis not present

## 2020-08-29 LAB — CMP (CANCER CENTER ONLY)
ALT: 17 U/L (ref 0–44)
AST: 22 U/L (ref 15–41)
Albumin: 4 g/dL (ref 3.5–5.0)
Alkaline Phosphatase: 46 U/L (ref 38–126)
Anion gap: 7 (ref 5–15)
BUN: 24 mg/dL — ABNORMAL HIGH (ref 8–23)
CO2: 26 mmol/L (ref 22–32)
Calcium: 9.6 mg/dL (ref 8.9–10.3)
Chloride: 111 mmol/L (ref 98–111)
Creatinine: 1.13 mg/dL — ABNORMAL HIGH (ref 0.44–1.00)
GFR, Estimated: 52 mL/min — ABNORMAL LOW (ref >60)
Glucose, Bld: 99 mg/dL (ref 70–99)
Potassium: 4.4 mmol/L (ref 3.5–5.1)
Sodium: 144 mmol/L (ref 135–145)
Total Bilirubin: 0.4 mg/dL (ref 0.3–1.2)
Total Protein: 6 g/dL — ABNORMAL LOW (ref 6.5–8.1)

## 2020-08-29 LAB — CBC WITH DIFFERENTIAL (CANCER CENTER ONLY)
Abs Immature Granulocytes: 0.02 10*3/uL (ref 0.00–0.07)
Basophils Absolute: 0 10*3/uL (ref 0.0–0.1)
Basophils Relative: 0 %
Eosinophils Absolute: 0.1 10*3/uL (ref 0.0–0.5)
Eosinophils Relative: 1 %
HCT: 28.3 % — ABNORMAL LOW (ref 36.0–46.0)
Hemoglobin: 8.8 g/dL — ABNORMAL LOW (ref 12.0–15.0)
Immature Granulocytes: 1 %
Lymphocytes Relative: 39 %
Lymphs Abs: 1.7 10*3/uL (ref 0.7–4.0)
MCH: 36.1 pg — ABNORMAL HIGH (ref 26.0–34.0)
MCHC: 31.1 g/dL (ref 30.0–36.0)
MCV: 116 fL — ABNORMAL HIGH (ref 80.0–100.0)
Monocytes Absolute: 0.5 10*3/uL (ref 0.1–1.0)
Monocytes Relative: 12 %
Neutro Abs: 2.1 10*3/uL (ref 1.7–7.7)
Neutrophils Relative %: 47 %
Platelet Count: 111 10*3/uL — ABNORMAL LOW (ref 150–400)
RBC: 2.44 MIL/uL — ABNORMAL LOW (ref 3.87–5.11)
RDW: 18.3 % — ABNORMAL HIGH (ref 11.5–15.5)
WBC Count: 4.4 10*3/uL (ref 4.0–10.5)
nRBC: 0 % (ref 0.0–0.2)

## 2020-08-29 LAB — VITAMIN B12: Vitamin B-12: 706 pg/mL (ref 180–914)

## 2020-08-29 LAB — FOLATE: Folate: >100 ng/mL (ref >5.9)

## 2020-08-29 MED ORDER — EPOETIN ALFA-EPBX 40000 UNIT/ML IJ SOLN
40000.0000 [IU] | Freq: Once | INTRAMUSCULAR | Status: AC
  Administered 2020-08-29: 40000 [IU] via SUBCUTANEOUS

## 2020-08-29 MED ORDER — EPOETIN ALFA-EPBX 40000 UNIT/ML IJ SOLN
INTRAMUSCULAR | Status: AC
  Filled 2020-08-29: qty 1

## 2020-08-29 NOTE — Telephone Encounter (Signed)
appts made and printed for pt per 08/29/20 los    Helen Hardy

## 2020-08-29 NOTE — Progress Notes (Signed)
Hematology and Oncology Follow Up Visit  Helen Hardy [id number] 09/20/1950 70 y.o. 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 She is feeling fatigue. 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 She states that she is not yet a candidate for surgery on her feet.  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  No fall or syncope to report.  No fever, chills, n/v, cough, rash, dizziness, SOB, chest pain, palpitations, abdominal pain or changes in bowel or bladder habits.  She denies noting any blood loss. No abnormal bruising, no petechiae.  She is eating well and doing her best to stay well hydrated. Her weight is stable at 140 lbs.    ECOG Performance Status: 1 - Symptomatic but completely ambulatory  Medications:  Allergies as of 08/29/2020      Reactions   Oxycodone Nausea And Vomiting   Tramadol Other (See Comments)   UNKNOWN REACTION      Medication List       Accurate as of August 29, 2020 11:22 AM. If you have any questions, ask your nurse or doctor.        estradiol 1 MG tablet Commonly known as: ESTRACE Take 1 mg by mouth daily.   folic acid 1 MG tablet Commonly known as: FOLVITE Take 2 mg by mouth daily.   furosemide 40 MG tablet Commonly known as: LASIX Take 40 mg by mouth daily.   gabapentin 300 MG capsule Commonly known as: NEURONTIN Take 300-600 mg by mouth See admin instructions. 300 mg in the morning and at 2 pm. 600 mg every night   HYDROcodone-acetaminophen 10-325 MG tablet Commonly known as: NORCO Take 1 tablet by mouth every 8 (eight) hours as  needed for moderate pain or severe pain.   leflunomide 20 MG tablet Commonly known as: ARAVA Take 20 mg by mouth daily.   methocarbamol 500 MG tablet Commonly known as: ROBAXIN Take 750 mg by mouth in the morning and at bedtime.   multivitamin tablet Take 1 tablet by mouth daily.   olmesartan 20 MG tablet Commonly known as: BENICAR Take 20 mg by mouth daily.   ondansetron 4 MG tablet Commonly known as: ZOFRAN Take 4 mg by mouth every 8 (eight) hours as needed for nausea or vomiting.   Orencia 250 MG injection Generic drug: abatacept See admin instructions.   pantoprazole 40 MG tablet Commonly known as: PROTONIX Take 40 mg by mouth daily.   Potassium Chloride ER 20 MEQ Tbcr Take 20 mEq by mouth daily.   predniSONE 10 MG tablet Commonly known as: DELTASONE Take 10 mg by mouth daily.   triamcinolone 0.1 % Commonly known as: KENALOG Apply 1 application topically as needed (for rash).   vitamin B-12 1000 MCG tablet Commonly known as: CYANOCOBALAMIN Take 1,000 mcg by mouth daily.   vitamin C 1000 MG tablet Take 1,000 mg by mouth daily.   Vitamin D3 25 MCG (1000 UT) Caps Take 2,000 Units by mouth daily.       Allergies:  Allergies  Allergen Reactions  . Oxycodone  Nausea And Vomiting  . Tramadol Other (See Comments)    UNKNOWN REACTION    Past Medical History, Surgical history, Social history, and Family History were reviewed and updated.  Review of Systems: All other 10 point review of systems is negative.   Physical Exam:  height is 5' (1.524 m) and weight is 225 lb (102.1 kg). Her oral temperature is 98.9 F (37.2 C). Her blood pressure is 140/63 and her pulse is 77. Her respiration is 18 and oxygen saturation is 100%.   Wt Readings from Last 3 Encounters:  08/29/20 225 lb (102.1 kg)  07/18/20 214 lb (97.1 kg)  06/12/20 212 lb (96.2 kg)    Ocular: Sclerae unicteric, pupils equal, round and reactive to light Ear-nose-throat: Oropharynx clear,  dentition fair Lymphatic: No cervical or supraclavicular adenopathy Lungs no rales or rhonchi, good excursion bilaterally Heart regular rate and rhythm, no murmur appreciated Abd soft, nontender, positive bowel sounds MSK no focal spinal tenderness, no joint edema Neuro: non-focal, well-oriented, appropriate affect Breasts: Deferred   Lab Results  Component Value Date   WBC 4.4 08/29/2020   HGB 8.8 (L) 08/29/2020   HCT 28.3 (L) 08/29/2020   MCV 116.0 (H) 08/29/2020   PLT 111 (L) 08/29/2020   Lab Results  Component Value Date   FERRITIN 3,433 (H) 07/18/2020   IRON 75 07/18/2020   TIBC 256 07/18/2020   UIBC 182 07/18/2020   IRONPCTSAT 29 07/18/2020   Lab Results  Component Value Date   RETICCTPCT 1.8 07/04/2020   RBC 2.44 (L) 08/29/2020   RETICCTABS 41.0 06/21/2015   No results found for: KPAFRELGTCHN, LAMBDASER, KAPLAMBRATIO No results found for: IGGSERUM, IGA, IGMSERUM No results found for: Odetta Pink, SPEI   Chemistry      Component Value Date/Time   NA 144 08/08/2020 1257   NA 144 04/29/2017 0929   K 4.7 08/08/2020 1257   K 4.0 04/29/2017 0929   CL 107 08/08/2020 1257   CL 103 06/21/2015 0800   CO2 29 08/08/2020 1257   CO2 25 04/29/2017 0929   BUN 36 (H) 08/08/2020 1257   BUN 17.7 04/29/2017 0929   CREATININE 1.43 (H) 08/08/2020 1257   CREATININE 0.8 04/29/2017 0929      Component Value Date/Time   CALCIUM 10.0 08/08/2020 1257   CALCIUM 9.3 04/29/2017 0929   ALKPHOS 49 08/08/2020 1257   ALKPHOS 55 04/29/2017 0929   AST 18 08/08/2020 1257   AST 19 04/29/2017 0929   ALT 13 08/08/2020 1257   ALT 16 04/29/2017 0929   BILITOT 0.5 08/08/2020 1257   BILITOT 0.43 04/29/2017 0929       Impression and Plan: Helen Hardy is a very pleasant 70yo African American female with 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  She also has multifactorial anemia.  She received  Retacrit today, Hgb 8.8.  Iron studies are pending. We will set her up for IV iron if needed.  B 12 and folate were also added to today's lab work.  Lab and injection 3 weeks and follow-up in 6 weeks.  She can cantact our office with any questions or concerns.   Helen Peace, NP 2/24/202211:22 AM

## 2020-08-30 LAB — FERRITIN: Ferritin: 2560 ng/mL — ABNORMAL HIGH (ref 11–307)

## 2020-08-30 LAB — IRON AND TIBC
Iron: 136 ug/dL (ref 41–142)
Saturation Ratios: 58 % — ABNORMAL HIGH (ref 21–57)
TIBC: 233 ug/dL — ABNORMAL LOW (ref 236–444)
UIBC: 97 ug/dL — ABNORMAL LOW (ref 120–384)

## 2020-09-04 ENCOUNTER — Telehealth: Payer: Self-pay | Admitting: *Deleted

## 2020-09-04 NOTE — Telephone Encounter (Signed)
Patient called inquiring about needing an iron infusion.  Reviewed labwork with [place].  No iron infusion needed at this time.

## 2020-09-06 DIAGNOSIS — M5416 Radiculopathy, lumbar region: Secondary | ICD-10-CM | POA: Diagnosis not present

## 2020-09-10 DIAGNOSIS — I10 Essential (primary) hypertension: Secondary | ICD-10-CM | POA: Diagnosis not present

## 2020-09-10 DIAGNOSIS — M0579 Rheumatoid arthritis with rheumatoid factor of multiple sites without organ or systems involvement: Secondary | ICD-10-CM | POA: Diagnosis not present

## 2020-09-11 DIAGNOSIS — Z79899 Other long term (current) drug therapy: Secondary | ICD-10-CM | POA: Diagnosis not present

## 2020-09-11 DIAGNOSIS — I10 Essential (primary) hypertension: Secondary | ICD-10-CM | POA: Diagnosis not present

## 2020-09-12 ENCOUNTER — Telehealth: Payer: Self-pay

## 2020-09-12 NOTE — Telephone Encounter (Signed)
S/w pt to r/s her 10/10/20 appt as Judson Roch is no longer in the office on thurs.  Pt req to get back on sch with Dr Elisha Headland and her appt was moved to him 4/7     anne

## 2020-09-13 DIAGNOSIS — M5416 Radiculopathy, lumbar region: Secondary | ICD-10-CM | POA: Diagnosis not present

## 2020-09-19 ENCOUNTER — Inpatient Hospital Stay: Payer: Medicare Other | Attending: Hematology & Oncology

## 2020-09-19 ENCOUNTER — Inpatient Hospital Stay: Payer: Medicare Other

## 2020-09-19 ENCOUNTER — Other Ambulatory Visit: Payer: Self-pay

## 2020-09-19 VITALS — BP 119/70 | HR 72 | Temp 98.2°F | Resp 19

## 2020-09-19 DIAGNOSIS — D510 Vitamin B12 deficiency anemia due to intrinsic factor deficiency: Secondary | ICD-10-CM

## 2020-09-19 DIAGNOSIS — D631 Anemia in chronic kidney disease: Secondary | ICD-10-CM | POA: Insufficient documentation

## 2020-09-19 DIAGNOSIS — I129 Hypertensive chronic kidney disease with stage 1 through stage 4 chronic kidney disease, or unspecified chronic kidney disease: Secondary | ICD-10-CM | POA: Diagnosis not present

## 2020-09-19 DIAGNOSIS — Z79899 Other long term (current) drug therapy: Secondary | ICD-10-CM | POA: Insufficient documentation

## 2020-09-19 DIAGNOSIS — N183 Chronic kidney disease, stage 3 unspecified: Secondary | ICD-10-CM | POA: Diagnosis not present

## 2020-09-19 DIAGNOSIS — D500 Iron deficiency anemia secondary to blood loss (chronic): Secondary | ICD-10-CM

## 2020-09-19 DIAGNOSIS — K909 Intestinal malabsorption, unspecified: Secondary | ICD-10-CM

## 2020-09-19 LAB — CBC WITH DIFFERENTIAL (CANCER CENTER ONLY)
Abs Immature Granulocytes: 0.05 10*3/uL (ref 0.00–0.07)
Basophils Absolute: 0 10*3/uL (ref 0.0–0.1)
Basophils Relative: 0 %
Eosinophils Absolute: 0.1 10*3/uL (ref 0.0–0.5)
Eosinophils Relative: 1 %
HCT: 29.9 % — ABNORMAL LOW (ref 36.0–46.0)
Hemoglobin: 9.5 g/dL — ABNORMAL LOW (ref 12.0–15.0)
Immature Granulocytes: 1 %
Lymphocytes Relative: 39 %
Lymphs Abs: 2 10*3/uL (ref 0.7–4.0)
MCH: 36.8 pg — ABNORMAL HIGH (ref 26.0–34.0)
MCHC: 31.8 g/dL (ref 30.0–36.0)
MCV: 115.9 fL — ABNORMAL HIGH (ref 80.0–100.0)
Monocytes Absolute: 0.5 10*3/uL (ref 0.1–1.0)
Monocytes Relative: 10 %
Neutro Abs: 2.6 10*3/uL (ref 1.7–7.7)
Neutrophils Relative %: 49 %
Platelet Count: 146 10*3/uL — ABNORMAL LOW (ref 150–400)
RBC: 2.58 MIL/uL — ABNORMAL LOW (ref 3.87–5.11)
RDW: 17.5 % — ABNORMAL HIGH (ref 11.5–15.5)
WBC Count: 5.2 10*3/uL (ref 4.0–10.5)
nRBC: 0 % (ref 0.0–0.2)

## 2020-09-19 LAB — CMP (CANCER CENTER ONLY)
ALT: 13 U/L (ref 0–44)
AST: 17 U/L (ref 15–41)
Albumin: 4 g/dL (ref 3.5–5.0)
Alkaline Phosphatase: 50 U/L (ref 38–126)
Anion gap: 9 (ref 5–15)
BUN: 37 mg/dL — ABNORMAL HIGH (ref 8–23)
CO2: 29 mmol/L (ref 22–32)
Calcium: 9.5 mg/dL (ref 8.9–10.3)
Chloride: 108 mmol/L (ref 98–111)
Creatinine: 1.5 mg/dL — ABNORMAL HIGH (ref 0.44–1.00)
GFR, Estimated: 37 mL/min — ABNORMAL LOW (ref >60)
Glucose, Bld: 99 mg/dL (ref 70–99)
Potassium: 4.8 mmol/L (ref 3.5–5.1)
Sodium: 146 mmol/L — ABNORMAL HIGH (ref 135–145)
Total Bilirubin: 0.4 mg/dL (ref 0.3–1.2)
Total Protein: 5.9 g/dL — ABNORMAL LOW (ref 6.5–8.1)

## 2020-09-19 LAB — VITAMIN B12: Vitamin B-12: 780 pg/mL (ref 180–914)

## 2020-09-19 LAB — FOLATE: Folate: >100 ng/mL (ref >5.9)

## 2020-09-19 MED ORDER — EPOETIN ALFA-EPBX 40000 UNIT/ML IJ SOLN
40000.0000 [IU] | Freq: Once | INTRAMUSCULAR | Status: AC
  Administered 2020-09-19: 40000 [IU] via SUBCUTANEOUS

## 2020-09-19 MED ORDER — EPOETIN ALFA-EPBX 40000 UNIT/ML IJ SOLN
INTRAMUSCULAR | Status: AC
  Filled 2020-09-19: qty 1

## 2020-09-20 DIAGNOSIS — M25561 Pain in right knee: Secondary | ICD-10-CM | POA: Diagnosis not present

## 2020-09-21 ENCOUNTER — Other Ambulatory Visit: Payer: Self-pay | Admitting: Internal Medicine

## 2020-09-21 DIAGNOSIS — I10 Essential (primary) hypertension: Secondary | ICD-10-CM

## 2020-10-04 DIAGNOSIS — M25561 Pain in right knee: Secondary | ICD-10-CM | POA: Diagnosis not present

## 2020-10-09 ENCOUNTER — Other Ambulatory Visit: Payer: Medicare Other

## 2020-10-09 DIAGNOSIS — M0579 Rheumatoid arthritis with rheumatoid factor of multiple sites without organ or systems involvement: Secondary | ICD-10-CM | POA: Diagnosis not present

## 2020-10-10 ENCOUNTER — Other Ambulatory Visit: Payer: Medicare Other

## 2020-10-10 ENCOUNTER — Encounter: Payer: Self-pay | Admitting: Hematology & Oncology

## 2020-10-10 ENCOUNTER — Ambulatory Visit: Payer: Medicare Other

## 2020-10-10 ENCOUNTER — Other Ambulatory Visit: Payer: Self-pay

## 2020-10-10 ENCOUNTER — Ambulatory Visit: Payer: Medicare Other | Admitting: Family

## 2020-10-10 ENCOUNTER — Inpatient Hospital Stay: Payer: Medicare Other | Attending: Hematology & Oncology

## 2020-10-10 ENCOUNTER — Inpatient Hospital Stay: Payer: Medicare Other

## 2020-10-10 ENCOUNTER — Inpatient Hospital Stay (HOSPITAL_BASED_OUTPATIENT_CLINIC_OR_DEPARTMENT_OTHER): Payer: Medicare Other | Admitting: Hematology & Oncology

## 2020-10-10 VITALS — BP 126/69 | HR 76 | Temp 98.3°F | Resp 19 | Wt 223.0 lb

## 2020-10-10 DIAGNOSIS — I129 Hypertensive chronic kidney disease with stage 1 through stage 4 chronic kidney disease, or unspecified chronic kidney disease: Secondary | ICD-10-CM | POA: Diagnosis not present

## 2020-10-10 DIAGNOSIS — E611 Iron deficiency: Secondary | ICD-10-CM | POA: Insufficient documentation

## 2020-10-10 DIAGNOSIS — D500 Iron deficiency anemia secondary to blood loss (chronic): Secondary | ICD-10-CM

## 2020-10-10 DIAGNOSIS — D509 Iron deficiency anemia, unspecified: Secondary | ICD-10-CM | POA: Insufficient documentation

## 2020-10-10 DIAGNOSIS — D631 Anemia in chronic kidney disease: Secondary | ICD-10-CM | POA: Insufficient documentation

## 2020-10-10 DIAGNOSIS — Z8572 Personal history of non-Hodgkin lymphomas: Secondary | ICD-10-CM | POA: Diagnosis not present

## 2020-10-10 DIAGNOSIS — N183 Chronic kidney disease, stage 3 unspecified: Secondary | ICD-10-CM

## 2020-10-10 DIAGNOSIS — N189 Chronic kidney disease, unspecified: Secondary | ICD-10-CM | POA: Insufficient documentation

## 2020-10-10 DIAGNOSIS — M069 Rheumatoid arthritis, unspecified: Secondary | ICD-10-CM | POA: Insufficient documentation

## 2020-10-10 DIAGNOSIS — Z79899 Other long term (current) drug therapy: Secondary | ICD-10-CM | POA: Insufficient documentation

## 2020-10-10 DIAGNOSIS — K909 Intestinal malabsorption, unspecified: Secondary | ICD-10-CM

## 2020-10-10 LAB — CMP (CANCER CENTER ONLY)
ALT: 14 U/L (ref 0–44)
AST: 21 U/L (ref 15–41)
Albumin: 4.1 g/dL (ref 3.5–5.0)
Alkaline Phosphatase: 54 U/L (ref 38–126)
Anion gap: 10 (ref 5–15)
BUN: 24 mg/dL — ABNORMAL HIGH (ref 8–23)
CO2: 25 mmol/L (ref 22–32)
Calcium: 9.6 mg/dL (ref 8.9–10.3)
Chloride: 112 mmol/L — ABNORMAL HIGH (ref 98–111)
Creatinine: 1.28 mg/dL — ABNORMAL HIGH (ref 0.44–1.00)
GFR, Estimated: 45 mL/min — ABNORMAL LOW (ref >60)
Glucose, Bld: 111 mg/dL — ABNORMAL HIGH (ref 70–99)
Potassium: 4.4 mmol/L (ref 3.5–5.1)
Sodium: 147 mmol/L — ABNORMAL HIGH (ref 135–145)
Total Bilirubin: 0.4 mg/dL (ref 0.3–1.2)
Total Protein: 6.4 g/dL — ABNORMAL LOW (ref 6.5–8.1)

## 2020-10-10 LAB — CBC WITH DIFFERENTIAL (CANCER CENTER ONLY)
Abs Immature Granulocytes: 0.08 10*3/uL — ABNORMAL HIGH (ref 0.00–0.07)
Basophils Absolute: 0 10*3/uL (ref 0.0–0.1)
Basophils Relative: 0 %
Eosinophils Absolute: 0 10*3/uL (ref 0.0–0.5)
Eosinophils Relative: 0 %
HCT: 31.5 % — ABNORMAL LOW (ref 36.0–46.0)
Hemoglobin: 10 g/dL — ABNORMAL LOW (ref 12.0–15.0)
Immature Granulocytes: 1 %
Lymphocytes Relative: 34 %
Lymphs Abs: 2.1 10*3/uL (ref 0.7–4.0)
MCH: 36.5 pg — ABNORMAL HIGH (ref 26.0–34.0)
MCHC: 31.7 g/dL (ref 30.0–36.0)
MCV: 115 fL — ABNORMAL HIGH (ref 80.0–100.0)
Monocytes Absolute: 0.5 10*3/uL (ref 0.1–1.0)
Monocytes Relative: 8 %
Neutro Abs: 3.5 10*3/uL (ref 1.7–7.7)
Neutrophils Relative %: 57 %
Platelet Count: 150 10*3/uL (ref 150–400)
RBC: 2.74 MIL/uL — ABNORMAL LOW (ref 3.87–5.11)
RDW: 16.3 % — ABNORMAL HIGH (ref 11.5–15.5)
WBC Count: 6.1 10*3/uL (ref 4.0–10.5)
nRBC: 0 % (ref 0.0–0.2)

## 2020-10-10 LAB — LACTATE DEHYDROGENASE: LDH: 272 U/L — ABNORMAL HIGH (ref 98–192)

## 2020-10-10 LAB — SAVE SMEAR(SSMR), FOR PROVIDER SLIDE REVIEW

## 2020-10-10 LAB — RETICULOCYTES
Immature Retic Fract: 22 % — ABNORMAL HIGH (ref 2.3–15.9)
RBC.: 2.73 MIL/uL — ABNORMAL LOW (ref 3.87–5.11)
Retic Count, Absolute: 46.1 10*3/uL (ref 19.0–186.0)
Retic Ct Pct: 1.7 % (ref 0.4–3.1)

## 2020-10-10 MED ORDER — EPOETIN ALFA-EPBX 40000 UNIT/ML IJ SOLN
INTRAMUSCULAR | Status: AC
  Filled 2020-10-10: qty 1

## 2020-10-10 MED ORDER — EPOETIN ALFA-EPBX 40000 UNIT/ML IJ SOLN
40000.0000 [IU] | Freq: Once | INTRAMUSCULAR | Status: AC
Start: 2020-10-10 — End: 2020-10-10
  Administered 2020-10-10: 40000 [IU] via SUBCUTANEOUS

## 2020-10-10 NOTE — Patient Instructions (Signed)

## 2020-10-10 NOTE — Progress Notes (Signed)
Hematology and Oncology Follow Up Visit  Helen Hardy [id number] 06-16-1951 70 y.o. 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  Unfortunately, she GIST have a lot of problems with her joints.  Rheumatoid arthritis really is quite active.  She has a brace on her right ankle.  It sounds like she may need to have surgery for this.  She may need to have issues with her knee.  Again, the rheumatoid arthritis is really causing problems I just feel bad for her.  She has not had any flareups of her colitis.  She was in the hospital for this in the past and really had a hard time with this.  She has had no fever.  She has had no obvious bleeding.  Her last iron studies that were done back in February showed a ferritin of 2500 with an iron saturation of 58%.  The ferritin elevation clearly is inflammatory.  She is going have a nice Easter with her family.  I am just happy for her.  There is no obvious change in bowel or bladder habits.  Overall, her performance status is ECOG 2.    Medications:  Allergies as of 10/10/2020      Reactions   Oxycodone Nausea And Vomiting   Tramadol Other (See Comments)   UNKNOWN REACTION      Medication List       Accurate as of October 10, 2020  3:59 PM. If you have any questions, ask your nurse or doctor.        estradiol 1 MG tablet Commonly known as: ESTRACE Take 1 mg by mouth daily.   folic acid 1 MG tablet Commonly known as: FOLVITE Take 2 mg by mouth daily.   furosemide 40 MG tablet Commonly known as: LASIX Take 40 mg by mouth daily.   gabapentin 300 MG capsule Commonly known as: NEURONTIN Take 300-600 mg by mouth See admin  instructions. 300 mg in the morning and at 2 pm. 600 mg every night   HYDROcodone-acetaminophen 10-325 MG tablet Commonly known as: NORCO Take 1 tablet by mouth every 8 (eight) hours as needed for moderate pain or severe pain.   leflunomide 20 MG tablet Commonly known as: ARAVA Take 20 mg by mouth daily.   methocarbamol 500 MG tablet Commonly known as: ROBAXIN Take 750 mg by mouth in the morning and at bedtime.   multivitamin tablet Take 1 tablet by mouth daily.   olmesartan 20 MG tablet Commonly known as: BENICAR Take 20 mg by mouth daily.   ondansetron 4 MG tablet Commonly known as: ZOFRAN Take 4 mg by mouth every 8 (eight) hours as needed for nausea or vomiting.   Orencia 250 MG injection Generic drug: abatacept See admin instructions.   pantoprazole 40 MG tablet Commonly known as: PROTONIX Take 40 mg by mouth daily.   Potassium Chloride ER 20 MEQ Tbcr Take 20 mEq by mouth daily.   predniSONE 10 MG tablet Commonly known as: DELTASONE Take 10 mg by mouth daily.   triamcinolone cream 0.1 % Commonly known as: KENALOG Apply 1 application topically as needed (for rash).   vitamin B-12 1000 MCG tablet Commonly known as: CYANOCOBALAMIN Take 1,000 mcg by  mouth daily.   vitamin C 1000 MG tablet Take 1,000 mg by mouth daily.   Vitamin D3 25 MCG (1000 UT) Caps Take 2,000 Units by mouth daily.       Allergies:  Allergies  Allergen Reactions  . Oxycodone Nausea And Vomiting  . Tramadol Other (See Comments)    UNKNOWN REACTION    Past Medical History, Surgical history, Social history, and Family History were reviewed and updated.  Review of Systems: Review of Systems  Constitutional: Negative.   HENT: Negative.   Eyes: Negative.   Respiratory: Positive for shortness of breath.   Cardiovascular: Negative.   Gastrointestinal: Positive for abdominal pain, blood in stool and nausea.  Genitourinary: Negative.   Musculoskeletal: Positive for joint pain.   Skin: Negative.   Neurological: Negative.   Endo/Heme/Allergies: Negative.   Psychiatric/Behavioral: Negative.      Physical Exam:  weight is 223 lb (101.2 kg). Her oral temperature is 98.3 F (36.8 C). Her blood pressure is 126/69 and her pulse is 76. Her respiration is 19 and oxygen saturation is 100%.   Wt Readings from Last 3 Encounters:  10/10/20 223 lb (101.2 kg)  08/29/20 225 lb (102.1 kg)  07/18/20 214 lb (97.1 kg)    Physical Exam Vitals reviewed.  HENT:     Head: Normocephalic and atraumatic.  Eyes:     Pupils: Pupils are equal, round, and reactive to light.  Cardiovascular:     Rate and Rhythm: Normal rate and regular rhythm.     Heart sounds: Normal heart sounds.  Pulmonary:     Effort: Pulmonary effort is normal.     Breath sounds: Normal breath sounds.  Abdominal:     General: Bowel sounds are normal.     Palpations: Abdomen is soft.  Musculoskeletal:        General: No tenderness or deformity. Normal range of motion.     Cervical back: Normal range of motion.     Comments: She has obvious arthritic deformities in her joints.  She has markedly decreased range of motion of her right shoulder.  She has a little bit of swelling in the legs.  Lymphadenopathy:     Cervical: No cervical adenopathy.  Skin:    General: Skin is warm and dry.     Findings: No erythema or rash.  Neurological:     Mental Status: She is alert and oriented to person, place, and time.  Psychiatric:        Behavior: Behavior normal.        Thought Content: Thought content normal.        Judgment: Judgment normal.      Lab Results  Component Value Date   WBC 6.1 10/10/2020   HGB 10.0 (L) 10/10/2020   HCT 31.5 (L) 10/10/2020   MCV 115.0 (H) 10/10/2020   PLT 150 10/10/2020   Lab Results  Component Value Date   FERRITIN 2,560 (H) 08/29/2020   IRON 136 08/29/2020   TIBC 233 (L) 08/29/2020   UIBC 97 (L) 08/29/2020   IRONPCTSAT 58 (H) 08/29/2020   Lab Results  Component  Value Date   RETICCTPCT 1.7 10/10/2020   RBC 2.73 (L) 10/10/2020   RETICCTABS 41.0 06/21/2015   No results found for: KPAFRELGTCHN, LAMBDASER, KAPLAMBRATIO No results found for: IGGSERUM, IGA, IGMSERUM No results found for: TOTALPROTELP, ALBUMINELP, A1GS, A2GS, BETS, BETA2SER, GAMS, MSPIKE, SPEI   Chemistry      Component Value Date/Time   NA 147 (H) 10/10/2020 1328  NA 144 04/29/2017 0929   K 4.4 10/10/2020 1328   K 4.0 04/29/2017 0929   CL 112 (H) 10/10/2020 1328   CL 103 06/21/2015 0800   CO2 25 10/10/2020 1328   CO2 25 04/29/2017 0929   BUN 24 (H) 10/10/2020 1328   BUN 17.7 04/29/2017 0929   CREATININE 1.28 (H) 10/10/2020 1328   CREATININE 0.8 04/29/2017 0929      Component Value Date/Time   CALCIUM 9.6 10/10/2020 1328   CALCIUM 9.3 04/29/2017 0929   ALKPHOS 54 10/10/2020 1328   ALKPHOS 55 04/29/2017 0929   AST 21 10/10/2020 1328   AST 19 04/29/2017 0929   ALT 14 10/10/2020 1328   ALT 16 04/29/2017 0929   BILITOT 0.4 10/10/2020 1328   BILITOT 0.43 04/29/2017 0929       Impression and Plan: Ms. Poullard is a very pleasant 70yo African American female with 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   Thankfully, her anemia is responding to the Retacrit.  I am happy about this.  We will go ahead and give her a dose of Retacrit today.  Hopefully she will not need a surgery for her joints.  If she does, I hope that this will not be a problem.  Again I just feel bad that her rheumatism is such an active issue.  We will plan to see her back to see [place] in another couple of months.   Volanda Napoleon, MD 4/7/20223:59 PM

## 2020-10-11 LAB — IRON AND TIBC
Iron: 135 ug/dL (ref 41–142)
Saturation Ratios: 54 % (ref 21–57)
TIBC: 250 ug/dL (ref 236–444)
UIBC: 115 ug/dL — ABNORMAL LOW (ref 120–384)

## 2020-10-11 LAB — FERRITIN: Ferritin: 2319 ng/mL — ABNORMAL HIGH (ref 11–307)

## 2020-10-14 ENCOUNTER — Telehealth: Payer: Self-pay | Admitting: Oncology

## 2020-10-14 DIAGNOSIS — M25561 Pain in right knee: Secondary | ICD-10-CM | POA: Diagnosis not present

## 2020-10-14 NOTE — Telephone Encounter (Signed)
Appointments scheduled calendar printed & mailed per 4/7 los

## 2020-10-15 DIAGNOSIS — Z23 Encounter for immunization: Secondary | ICD-10-CM | POA: Diagnosis not present

## 2020-10-21 ENCOUNTER — Other Ambulatory Visit: Payer: Self-pay

## 2020-10-21 ENCOUNTER — Ambulatory Visit
Admission: RE | Admit: 2020-10-21 | Discharge: 2020-10-21 | Disposition: A | Discharge status: Home / Self Care | Payer: Medicare Other | Source: Ambulatory Visit | Attending: Internal Medicine | Admitting: Internal Medicine

## 2020-10-21 DIAGNOSIS — E785 Hyperlipidemia, unspecified: Secondary | ICD-10-CM | POA: Diagnosis not present

## 2020-10-21 DIAGNOSIS — I10 Essential (primary) hypertension: Secondary | ICD-10-CM

## 2020-10-22 DIAGNOSIS — M25561 Pain in right knee: Secondary | ICD-10-CM | POA: Diagnosis not present

## 2020-10-25 DIAGNOSIS — M25561 Pain in right knee: Secondary | ICD-10-CM | POA: Diagnosis not present

## 2020-10-28 DIAGNOSIS — M25561 Pain in right knee: Secondary | ICD-10-CM | POA: Diagnosis not present

## 2020-10-31 DIAGNOSIS — T84099D Other mechanical complication of unspecified internal joint prosthesis, subsequent encounter: Secondary | ICD-10-CM | POA: Diagnosis not present

## 2020-10-31 DIAGNOSIS — Z96651 Presence of right artificial knee joint: Secondary | ICD-10-CM | POA: Diagnosis not present

## 2020-10-31 DIAGNOSIS — M25561 Pain in right knee: Secondary | ICD-10-CM | POA: Diagnosis not present

## 2020-11-05 DIAGNOSIS — M25561 Pain in right knee: Secondary | ICD-10-CM | POA: Diagnosis not present

## 2020-11-07 DIAGNOSIS — M0579 Rheumatoid arthritis with rheumatoid factor of multiple sites without organ or systems involvement: Secondary | ICD-10-CM | POA: Diagnosis not present

## 2020-11-08 ENCOUNTER — Emergency Department (HOSPITAL_BASED_OUTPATIENT_CLINIC_OR_DEPARTMENT_OTHER)
Admission: EM | Admit: 2020-11-08 | Discharge: 2020-11-08 | Disposition: A | Discharge status: Home / Self Care | Payer: Medicare Other | Attending: Emergency Medicine | Admitting: Emergency Medicine

## 2020-11-08 ENCOUNTER — Encounter (HOSPITAL_BASED_OUTPATIENT_CLINIC_OR_DEPARTMENT_OTHER): Payer: Self-pay | Admitting: *Deleted

## 2020-11-08 ENCOUNTER — Other Ambulatory Visit (HOSPITAL_BASED_OUTPATIENT_CLINIC_OR_DEPARTMENT_OTHER): Payer: Self-pay

## 2020-11-08 ENCOUNTER — Other Ambulatory Visit: Payer: Self-pay

## 2020-11-08 ENCOUNTER — Emergency Department (HOSPITAL_BASED_OUTPATIENT_CLINIC_OR_DEPARTMENT_OTHER): Payer: Medicare Other

## 2020-11-08 DIAGNOSIS — Z79899 Other long term (current) drug therapy: Secondary | ICD-10-CM | POA: Diagnosis not present

## 2020-11-08 DIAGNOSIS — I129 Hypertensive chronic kidney disease with stage 1 through stage 4 chronic kidney disease, or unspecified chronic kidney disease: Secondary | ICD-10-CM | POA: Insufficient documentation

## 2020-11-08 DIAGNOSIS — R0789 Other chest pain: Secondary | ICD-10-CM | POA: Diagnosis not present

## 2020-11-08 DIAGNOSIS — R079 Chest pain, unspecified: Secondary | ICD-10-CM | POA: Diagnosis not present

## 2020-11-08 DIAGNOSIS — U071 COVID-19: Secondary | ICD-10-CM | POA: Insufficient documentation

## 2020-11-08 DIAGNOSIS — N183 Chronic kidney disease, stage 3 unspecified: Secondary | ICD-10-CM | POA: Diagnosis not present

## 2020-11-08 DIAGNOSIS — Z96653 Presence of artificial knee joint, bilateral: Secondary | ICD-10-CM | POA: Diagnosis not present

## 2020-11-08 DIAGNOSIS — R070 Pain in throat: Secondary | ICD-10-CM | POA: Diagnosis present

## 2020-11-08 DIAGNOSIS — D631 Anemia in chronic kidney disease: Secondary | ICD-10-CM | POA: Diagnosis not present

## 2020-11-08 LAB — COMPREHENSIVE METABOLIC PANEL
ALT: 22 U/L (ref 0–44)
AST: 33 U/L (ref 15–41)
Albumin: 3.6 g/dL (ref 3.5–5.0)
Alkaline Phosphatase: 46 U/L (ref 38–126)
Anion gap: 10 (ref 5–15)
BUN: 25 mg/dL — ABNORMAL HIGH (ref 8–23)
CO2: 26 mmol/L (ref 22–32)
Calcium: 8.6 mg/dL — ABNORMAL LOW (ref 8.9–10.3)
Chloride: 103 mmol/L (ref 98–111)
Creatinine, Ser: 1.2 mg/dL — ABNORMAL HIGH (ref 0.44–1.00)
GFR, Estimated: 48 mL/min — ABNORMAL LOW (ref >60)
Glucose, Bld: 130 mg/dL — ABNORMAL HIGH (ref 70–99)
Potassium: 4.2 mmol/L (ref 3.5–5.1)
Sodium: 139 mmol/L (ref 135–145)
Total Bilirubin: 0.4 mg/dL (ref 0.3–1.2)
Total Protein: 6.6 g/dL (ref 6.5–8.1)

## 2020-11-08 LAB — CBC WITH DIFFERENTIAL/PLATELET
Abs Immature Granulocytes: 0.03 10*3/uL (ref 0.00–0.07)
Basophils Absolute: 0 10*3/uL (ref 0.0–0.1)
Basophils Relative: 0 %
Eosinophils Absolute: 0 10*3/uL (ref 0.0–0.5)
Eosinophils Relative: 0 %
HCT: 33.3 % — ABNORMAL LOW (ref 36.0–46.0)
Hemoglobin: 10.6 g/dL — ABNORMAL LOW (ref 12.0–15.0)
Immature Granulocytes: 1 %
Lymphocytes Relative: 41 %
Lymphs Abs: 2.1 10*3/uL (ref 0.7–4.0)
MCH: 36.7 pg — ABNORMAL HIGH (ref 26.0–34.0)
MCHC: 31.8 g/dL (ref 30.0–36.0)
MCV: 115.2 fL — ABNORMAL HIGH (ref 80.0–100.0)
Monocytes Absolute: 0.6 10*3/uL (ref 0.1–1.0)
Monocytes Relative: 11 %
Neutro Abs: 2.5 10*3/uL (ref 1.7–7.7)
Neutrophils Relative %: 47 %
Platelets: 136 10*3/uL — ABNORMAL LOW (ref 150–400)
RBC: 2.89 MIL/uL — ABNORMAL LOW (ref 3.87–5.11)
RDW: 15.6 % — ABNORMAL HIGH (ref 11.5–15.5)
Smear Review: NORMAL
WBC: 5.2 10*3/uL (ref 4.0–10.5)
nRBC: 0 % (ref 0.0–0.2)

## 2020-11-08 LAB — TROPONIN I (HIGH SENSITIVITY): Troponin I (High Sensitivity): 10 ng/L (ref <18)

## 2020-11-08 MED ORDER — NIRMATRELVIR/RITONAVIR (PAXLOVID)TABLET
ORAL_TABLET | ORAL | 0 refills | Status: DC
  Filled 2020-11-08: qty 20, 5d supply, fill #0

## 2020-11-08 NOTE — ED Provider Notes (Signed)
[facility] EMERGENCY DEPARTMENT Provider Note   CSN: [id number] Arrival date & time: 11/08/20  1438     History Chief Complaint  Patient presents with  . Covid Positive    Helen Hardy is a 70 y.o. female.  70 year old female with extensive past medical history below including non-Hodgkin's lymphoma, rheumatoid arthritis, CKD, hypertension, obesity who presents with COVID-19.  Patient states that last weekend her daughter tested positive for COVID.  The patient herself had a weakly positive home test 2 days ago. She woke up this morning with b/l ear pain and sore throat.  Her home COVID test was more strongly positive this morning.  She notes occasional left-sided, nonradiating chest pains that last about a second in duration and resolved.  They are not associated with exertion.  When asked to describe the pain, she states "it is just a pain."  No associated shortness of breath, nausea, vomiting, or diaphoresis.  She denies any diarrhea or fevers.  She came to the ED because she was instructed to do so by her doctor has of her history of immunocompromise.  The history is provided by the patient.       Past Medical History:  Diagnosis Date  . Anemia   . Anemia of chronic renal failure, stage 3 (moderate) ([place]) 12/30/2018  . Arthritis    Rheumatoid  . Cancer ([place])    Non-Hodgkins Lymphoma  . GERD (gastroesophageal reflux disease)   . Hypertension   . 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 Numbness and tingling of right leg   . Pneumonia     Patient Active Problem List   Diagnosis Date Noted  . GI bleed 03/25/2020  . SIRS (systemic inflammatory response syndrome) ([place]) 03/25/2020  . 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 Rheumatoid arthritis ([place]) 12/25/2019  . GERD (gastroesophageal reflux disease) 12/25/2019  . Essential hypertension 12/25/2019  . AKI (acute kidney injury) ([place]) 12/25/2019  . Hyperglycemia 12/25/2019  .  Chronic radicular lumbar pain 12/25/2019  . Generalized weakness 12/25/2019  . Anemia of chronic renal failure, stage 3 (moderate) ([place]) 12/30/2018  . 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 Iron deficiency anemia 06/21/2015  . 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 Morbid obesity ([place]) 04/10/2015  . 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 S/P knee replacement 04/08/2015    Past Surgical History:  Procedure Laterality Date  . 2012 Right big toe and toe beside - had pins in toes    . ABDOMINAL HYSTERECTOMY    . BIOPSY  04/01/2020   Procedure: BIOPSY;  Surgeon: Juanita Craver, MD;  Location: [facility];  Service: Gastroenterology;;  . bladder tack - 2005    . COLONOSCOPY WITH PROPOFOL N/A 04/01/2020   Procedure: COLONOSCOPY WITH PROPOFOL;  Surgeon: Juanita Craver, MD;  Location: [facility];  Service: Gastroenterology;  Laterality: N/A;  . EYE SURGERY Bilateral    cataract  . JOINT REPLACEMENT     both knee replacement  . LUMBAR LAMINECTOMY/DECOMPRESSION MICRODISCECTOMY N/A 03/04/2016   Procedure: RIGHT FAR LATERAL DISCECTOMY L5-S1;  Surgeon: Melina Schools, MD;  Location: [facility];  Service: Orthopedics;  Laterality: N/A;  . ROTATOR CUFF REPAIR Left   . TOTAL KNEE REVISION Left 04/08/2015   Procedure: REVISION LEFT TOTAL KNEE ;  Surgeon: Paralee Cancel, MD;  Location: [facility];  Service: Orthopedics;  Laterality: Left;     OB History   No obstetric history on file.     No family history on file.  Social History   Tobacco Use  . Smoking status: Never Smoker  . Smokeless tobacco: Never Used  Vaping Use  . Vaping Use: Never used  Substance Use Topics  . Alcohol use: No    Alcohol/week: 0.0 standard drinks  . Drug use: No    Home Medications Prior to Admission medications   Medication Sig Start Date End Date Taking? Authorizing Provider  nirmatrelvir/ritonavir EUA (PAXLOVID) TABS Take 1 tablet of Nirmatrelvir (150 mg) twice daily for 5 days and 1 tablet of Ritonavir (100 mg) twice daily for  5 days. 11/08/20  Yes Gibril Mastro, Wenda Overland, MD  abatacept (ORENCIA) 250 MG injection See admin instructions.    [provider]  Ascorbic Acid (VITAMIN C) 1000 MG tablet Take 1,000 mg by mouth daily.    [provider]  Cholecalciferol (VITAMIN D3) 1000 units CAPS Take 2,000 Units by mouth daily.     [provider]  estradiol (ESTRACE) 1 MG tablet Take 1 mg by mouth daily.  12/25/12   [provider]  folic acid (FOLVITE) 1 MG tablet Take 2 mg by mouth daily.     [provider]  furosemide (LASIX) 40 MG tablet Take 40 mg by mouth daily. 04/22/20   [provider]  gabapentin (NEURONTIN) 300 MG capsule Take 300-600 mg by mouth See admin instructions. 300 mg in the morning and at 2 pm. 600 mg every night 03/17/18   [provider]  HYDROcodone-acetaminophen (NORCO) 10-325 MG tablet Take 1 tablet by mouth every 8 (eight) hours as needed for moderate pain or severe pain.  12/16/18   [provider]  leflunomide (ARAVA) 20 MG tablet Take 20 mg by mouth daily.    [provider]  methocarbamol (ROBAXIN) 500 MG tablet Take 750 mg by mouth in the morning and at bedtime.  03/11/18   [provider]  Multiple Vitamin (MULTIVITAMIN) tablet Take 1 tablet by mouth daily.    [provider]  olmesartan (BENICAR) 20 MG tablet Take 20 mg by mouth daily. 12/27/18   [provider]  ondansetron (ZOFRAN) 4 MG tablet Take 4 mg by mouth every 8 (eight) hours as needed for nausea or vomiting.  04/10/19   [provider]  pantoprazole (PROTONIX) 40 MG tablet Take 40 mg by mouth daily.     [provider]  Potassium Chloride ER 20 MEQ TBCR Take 20 mEq by mouth daily. 05/09/20   [provider]  predniSONE (DELTASONE) 10 MG tablet Take 10 mg by mouth daily. 04/12/18   [provider]  triamcinolone cream (KENALOG) 0.1 % Apply 1 application topically as needed (for rash).     [provider]  vitamin B-12 (CYANOCOBALAMIN) 1000 MCG tablet Take 1,000 mcg by mouth daily.    [provider]    Allergies    Oxycodone and Tramadol  Review of Systems   Review of Systems  All other systems reviewed and are negative except that which was mentioned in HPI  Physical Exam Updated Vital Signs BP 126/72   Pulse 71   Temp 98.6 F (37 C) (Oral)   Resp 19   Ht 4' 11"  (1.499 m)   Wt 102.1 kg   SpO2 94%   BMI 45.44 kg/m   Physical Exam Vitals and nursing note reviewed.  Constitutional:      General: She is not in acute distress.    Appearance: Normal appearance. She is obese.  HENT:     Head:  Normocephalic and atraumatic.  Eyes:     Conjunctiva/sclera: Conjunctivae normal.  Cardiovascular:     Rate and Rhythm: Normal rate and regular rhythm.     Heart sounds: Normal heart sounds. No murmur heard.   Pulmonary:     Effort: Pulmonary effort is normal.     Breath sounds: Normal breath sounds.  Abdominal:     General: Abdomen is flat. Bowel sounds are normal. There is no distension.     Palpations: Abdomen is soft.     Tenderness: There is no abdominal tenderness.  Musculoskeletal:     Right lower leg: Edema present.     Left lower leg: Edema present.  Skin:    General: Skin is warm and dry.  Neurological:     Mental Status: She is alert and oriented to person, place, and time.     Comments: fluent  Psychiatric:        Mood and Affect: Mood normal.        Behavior: Behavior normal.     ED Results / Procedures / Treatments   Labs (all labs ordered are listed, but only abnormal results are displayed) Labs Reviewed  COMPREHENSIVE METABOLIC PANEL - Abnormal; Notable for the following components:      Result Value   Glucose, Bld 130 (*)    BUN 25 (*)    Creatinine, Ser 1.20 (*)    Calcium 8.6 (*)    GFR, Estimated 48 (*)    All other components within normal limits  CBC WITH DIFFERENTIAL/PLATELET - Abnormal; Notable for the following  components:   RBC 2.89 (*)    Hemoglobin 10.6 (*)    HCT 33.3 (*)    MCV 115.2 (*)    MCH 36.7 (*)    RDW 15.6 (*)    Platelets 136 (*)    All other components within normal limits  TROPONIN I (HIGH SENSITIVITY)    EKG EKG Interpretation  Date/Time:  Friday Nov 08 2020 14:49:55 EDT Ventricular Rate:  89 PR Interval:  113 QRS Duration: 72 QT Interval:  346 QTC Calculation: 421 R Axis:   39 Text Interpretation: Sinus rhythm Atrial premature complex Borderline short PR interval Abnormal R-wave progression, early transition No significant change since last tracing Confirmed by Theotis Burrow [phone]) on 11/08/2020 3:37:11 PM   Radiology DG Chest Port 1 View  Result Date: 11/08/2020 CLINICAL DATA:  COVID positive with chest pain EXAM: PORTABLE CHEST 1 VIEW COMPARISON:  04/03/2020 FINDINGS: The heart size and mediastinal contours are within normal limits. Aortic atherosclerosis. Both lungs are clear. The visualized skeletal structures are unremarkable. IMPRESSION: No active disease. Electronically Signed   By: Donavan Foil M.D.   On: 11/08/2020 16:27    Procedures Procedures   Medications Ordered in ED Medications - No data to display  ED Course  I have reviewed the triage vital signs and the nursing notes.  Pertinent labs & imaging results that were available during my care of the patient were reviewed by me and considered in my medical decision making (see chart for details).    MDM Rules/Calculators/A&P                          Alert and comfortable on exam with reassuring vital signs.  Normal O2 saturation on room air.  Afebrile.  Denying any breathing problems.  EKG without ischemic changes.  Her "chest pain" does not sound cardiac and happens for 1 second at a time.  I highly doubt ACS, PE, or other life threatening process. Labwork reassuring, shows stable CKD and normal WBC count. Trop negative. CXR clear. Pt remains well appearing w/ normal VS on reassessment. I discussed  medication options w/ pharmacist Apolonio Schneiders, who recommended Paxlovid as pt qualifies due to comorbidities and no interactions w/ current meds.  I discussed this medication with the patient and discussed supportive measures for her symptoms.  I have emphasized need for return to the ED if any breathing problems, concerns for dehydration, or sudden changes in symptoms.  She voiced understanding. Final Clinical Impression(s) / ED Diagnoses Final diagnoses:  COVID-19 virus infection    Rx / DC Orders ED Discharge Orders         Ordered    Ambulatory referral for Covid Treatment        11/08/20 1531    nirmatrelvir/ritonavir EUA (PAXLOVID) TABS       Note to Pharmacy: GFR IS 48   11/08/20 Brewer, Wenda Overland, MD 11/08/20 1712

## 2020-11-08 NOTE — ED Triage Notes (Signed)
Covid +, c/o sore throat , bil ear pain and lest sided chest pain

## 2020-11-15 DIAGNOSIS — Z96651 Presence of right artificial knee joint: Secondary | ICD-10-CM | POA: Diagnosis not present

## 2020-11-15 DIAGNOSIS — M25561 Pain in right knee: Secondary | ICD-10-CM | POA: Diagnosis not present

## 2020-11-19 ENCOUNTER — Ambulatory Visit (INDEPENDENT_AMBULATORY_CARE_PROVIDER_SITE_OTHER): Payer: Medicare Other | Admitting: Pulmonary Disease

## 2020-11-19 ENCOUNTER — Other Ambulatory Visit: Payer: Self-pay

## 2020-11-19 ENCOUNTER — Encounter: Payer: Self-pay | Admitting: Pulmonary Disease

## 2020-11-19 VITALS — BP 110/80 | HR 105 | Temp 97.3°F | Ht 59.0 in | Wt 214.0 lb

## 2020-11-19 DIAGNOSIS — J8410 Pulmonary fibrosis, unspecified: Secondary | ICD-10-CM | POA: Diagnosis not present

## 2020-11-19 NOTE — Patient Instructions (Addendum)
Continue current lines of treatment  No preclusion to your surgery  We will obtain a breathing study Obtain high-resolution CT scan of the chest  Always call if you feel your breathing is any different from what you are experiencing currently  Expectation is that if you are rheumatoid arthritis is well controlled, lung inflammation will be controlled as well  Follow-up in 6 months

## 2020-11-19 NOTE — Progress Notes (Signed)
Helen Hardy    [id number]    1950-10-04  Primary Care Physician:Pharr, Thayer Jew, MD  Referring Physician: Valinda Party, [place],  [place] [address]  Chief complaint:   Patient with abnormal CT of the chest  HPI:  Patient had cardiac CT performed and the CT does show pulmonary fibrosis  She has a history of rheumatoid arthritis, diagnosed in her 13s Has been on several agents  Methotrexate in the past Currently on Orencia, prednisone, Arava  History of non-Hodgkin's lymphoma  She does still have joint inflammation, joint pains controlled with pain medications  Has had bilateral knee replacement and is currently being evaluated for a review  Denies any specific respiratory complaints  Shortness of breath with activity, she does try to stay active  Never smoker No pertinent occupational history  Outpatient Encounter Medications as of 11/19/2020  Medication Sig  . abatacept (ORENCIA) 250 MG injection See admin instructions.  . Ascorbic Acid (VITAMIN C) 1000 MG tablet Take 1,000 mg by mouth daily.  . Cholecalciferol (VITAMIN D3) 1000 units CAPS Take 2,000 Units by mouth daily.   Marland Kitchen estradiol (ESTRACE) 1 MG tablet Take 1 mg by mouth daily.   . folic acid (FOLVITE) 1 MG tablet Take 2 mg by mouth daily.   . furosemide (LASIX) 40 MG tablet Take 40 mg by mouth daily.  Marland Kitchen gabapentin (NEURONTIN) 300 MG capsule Take 300-600 mg by mouth See admin instructions. 300 mg in the morning and at 2 pm. 600 mg every night  . HYDROcodone-acetaminophen (NORCO) 10-325 MG tablet Take 1 tablet by mouth every 8 (eight) hours as needed for moderate pain or severe pain.   Marland Kitchen leflunomide (ARAVA) 20 MG tablet Take 20 mg by mouth daily.  . methocarbamol (ROBAXIN) 500 MG tablet Take 750 mg by mouth in the morning and at bedtime.   . Multiple Vitamin (MULTIVITAMIN) tablet Take 1 tablet by mouth daily.  Marland Kitchen olmesartan (BENICAR) 20 MG tablet Take 20 mg by mouth  daily.  . ondansetron (ZOFRAN) 4 MG tablet Take 4 mg by mouth every 8 (eight) hours as needed for nausea or vomiting.   . pantoprazole (PROTONIX) 40 MG tablet Take 40 mg by mouth daily.   . Potassium Chloride ER 20 MEQ TBCR Take 20 mEq by mouth daily.  . predniSONE (DELTASONE) 10 MG tablet Take 10 mg by mouth daily.  . vitamin B-12 (CYANOCOBALAMIN) 1000 MCG tablet Take 1,000 mcg by mouth daily.  Marland Kitchen triamcinolone cream (KENALOG) 0.1 % Apply 1 application topically as needed (for rash).  (Patient not taking: Reported on 11/19/2020)  . [DISCONTINUED] nirmatrelvir/ritonavir EUA (PAXLOVID) TABS Take 1 tablet of Nirmatrelvir (150 mg) twice daily for 5 days and 1 tablet of Ritonavir (100 mg) twice daily for 5 days. (Patient not taking: Reported on 11/19/2020)   No facility-administered encounter medications on file as of 11/19/2020.    Allergies as of 11/19/2020 - Review Complete 11/19/2020  Allergen Reaction Noted  . Oxycodone Nausea And Vomiting 08/27/2011  . Tramadol Other (See Comments) 12/06/2019    Past Medical History:  Diagnosis Date  . Anemia   . Anemia of chronic renal failure, stage 3 (moderate) ([place]) 12/30/2018  . Arthritis    Rheumatoid  . Cancer ([place])    Non-Hodgkins Lymphoma  . GERD (gastroesophageal reflux disease)   . Hypertension   . 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 Numbness and tingling of right leg   .  Pneumonia     Past Surgical History:  Procedure Laterality Date  . 2012 Right big toe and toe beside - had pins in toes    . ABDOMINAL HYSTERECTOMY    . BIOPSY  04/01/2020   Procedure: BIOPSY;  Surgeon: Juanita Craver, MD;  Location: [facility];  Service: Gastroenterology;;  . bladder tack - 2005    . COLONOSCOPY WITH PROPOFOL N/A 04/01/2020   Procedure: COLONOSCOPY WITH PROPOFOL;  Surgeon: Juanita Craver, MD;  Location: [facility];  Service: Gastroenterology;  Laterality: N/A;  . EYE SURGERY Bilateral    cataract  . JOINT REPLACEMENT     both knee replacement  .  LUMBAR LAMINECTOMY/DECOMPRESSION MICRODISCECTOMY N/A 03/04/2016   Procedure: RIGHT FAR LATERAL DISCECTOMY L5-S1;  Surgeon: Melina Schools, MD;  Location: [facility];  Service: Orthopedics;  Laterality: N/A;  . ROTATOR CUFF REPAIR Left   . TOTAL KNEE REVISION Left 04/08/2015   Procedure: REVISION LEFT TOTAL KNEE ;  Surgeon: Paralee Cancel, MD;  Location: [facility];  Service: Orthopedics;  Laterality: Left;    No family history on file.  Social History   Socioeconomic History  . Marital status: Married    Spouse name: Not on file  . Number of children: Not on file  . Years of education: Not on file  . Highest education level: Not on file  Occupational History  . Not on file  Tobacco Use  . Smoking status: Never Smoker  . Smokeless tobacco: Never Used  Vaping Use  . Vaping Use: Never used  Substance and Sexual Activity  . Alcohol use: No    Alcohol/week: 0.0 standard drinks  . Drug use: No  . Sexual activity: Not on file  Other Topics Concern  . Not on file  Social History Narrative   Ambulates with cane or walker.  Lives with husband.   Social Determinants of Health   Financial Resource Strain: Not on file  Food Insecurity: No Food Insecurity  . Worried About Charity fundraiser in the Last Year: Never true  . Ran Out of Food in the Last Year: Never true  Transportation Needs: No Transportation Needs  . Lack of Transportation (Medical): No  . Lack of Transportation (Non-Medical): No  Physical Activity: Not on file  Stress: Not on file  Social Connections: Not on file  Intimate Partner Violence: Not on file    Review of Systems  Respiratory: Positive for shortness of breath. Negative for cough.   Musculoskeletal: Positive for arthralgias, back pain and gait problem.    Vitals:   11/19/20 1342  BP: 110/80  Pulse: (!) 105  Temp: (!) 97.3 F (36.3 C)  SpO2: 94%     Physical Exam Constitutional:      Appearance: She is obese.  HENT:     Head: Normocephalic and  atraumatic.     Right Ear: Tympanic membrane normal.     Mouth/Throat:     Mouth: Mucous membranes are moist.  Eyes:     Pupils: Pupils are equal, round, and reactive to light.  Cardiovascular:     Rate and Rhythm: Normal rate and regular rhythm.     Heart sounds: No murmur heard. No friction rub.  Pulmonary:     Effort: No respiratory distress.     Breath sounds: No stridor. No wheezing or rhonchi.  Musculoskeletal:     Cervical back: No rigidity or tenderness.  Neurological:     Mental Status: She is alert.  Psychiatric:  Mood and Affect: Mood normal.    Data Reviewed: CT scan reviewed showing areas of fibrosis, reviewed with the patient  Assessment:  Postinflammatory fibrosis  Rheumatoid arthritis-interstitial lung disease  Patient has significant symptoms at the present time RA appears to be well controlled  Plan/Recommendations: Will obtain high-resolution CT scan of the chest  Obtain pulmonary function test  Encouraged to call with any significant concerns Encouraged to call with any respiratory complaints  No preclusion to ongoing evaluation for knee replacement  Follow-up in 6 months   Sherrilyn Rist MD [facility] 11/19/2020, 2:03 PM  CC: Valinda Party, MD

## 2020-11-20 DIAGNOSIS — M76821 Posterior tibial tendinitis, right leg: Secondary | ICD-10-CM | POA: Diagnosis not present

## 2020-11-22 ENCOUNTER — Inpatient Hospital Stay (HOSPITAL_BASED_OUTPATIENT_CLINIC_OR_DEPARTMENT_OTHER): Payer: Medicare Other | Admitting: Family

## 2020-11-22 ENCOUNTER — Inpatient Hospital Stay: Payer: Medicare Other

## 2020-11-22 ENCOUNTER — Telehealth: Payer: Self-pay

## 2020-11-22 ENCOUNTER — Inpatient Hospital Stay: Payer: Medicare Other | Attending: Hematology & Oncology

## 2020-11-22 ENCOUNTER — Encounter: Payer: Self-pay | Admitting: Family

## 2020-11-22 ENCOUNTER — Other Ambulatory Visit: Payer: Self-pay

## 2020-11-22 VITALS — BP 129/66 | HR 86 | Temp 98.3°F | Resp 19 | Ht 59.0 in | Wt 217.8 lb

## 2020-11-22 DIAGNOSIS — I129 Hypertensive chronic kidney disease with stage 1 through stage 4 chronic kidney disease, or unspecified chronic kidney disease: Secondary | ICD-10-CM | POA: Diagnosis not present

## 2020-11-22 DIAGNOSIS — Z79899 Other long term (current) drug therapy: Secondary | ICD-10-CM | POA: Insufficient documentation

## 2020-11-22 DIAGNOSIS — N189 Chronic kidney disease, unspecified: Secondary | ICD-10-CM | POA: Insufficient documentation

## 2020-11-22 DIAGNOSIS — N183 Chronic kidney disease, stage 3 unspecified: Secondary | ICD-10-CM

## 2020-11-22 DIAGNOSIS — D500 Iron deficiency anemia secondary to blood loss (chronic): Secondary | ICD-10-CM

## 2020-11-22 DIAGNOSIS — D631 Anemia in chronic kidney disease: Secondary | ICD-10-CM | POA: Insufficient documentation

## 2020-11-22 DIAGNOSIS — K909 Intestinal malabsorption, unspecified: Secondary | ICD-10-CM

## 2020-11-22 LAB — CBC WITH DIFFERENTIAL (CANCER CENTER ONLY)
Abs Immature Granulocytes: 0.03 10*3/uL (ref 0.00–0.07)
Basophils Absolute: 0 10*3/uL (ref 0.0–0.1)
Basophils Relative: 0 %
Eosinophils Absolute: 0.1 10*3/uL (ref 0.0–0.5)
Eosinophils Relative: 1 %
HCT: 32 % — ABNORMAL LOW (ref 36.0–46.0)
Hemoglobin: 10.4 g/dL — ABNORMAL LOW (ref 12.0–15.0)
Immature Granulocytes: 0 %
Lymphocytes Relative: 31 %
Lymphs Abs: 2.1 10*3/uL (ref 0.7–4.0)
MCH: 36.4 pg — ABNORMAL HIGH (ref 26.0–34.0)
MCHC: 32.5 g/dL (ref 30.0–36.0)
MCV: 111.9 fL — ABNORMAL HIGH (ref 80.0–100.0)
Monocytes Absolute: 0.5 10*3/uL (ref 0.1–1.0)
Monocytes Relative: 8 %
Neutro Abs: 4.1 10*3/uL (ref 1.7–7.7)
Neutrophils Relative %: 60 %
Platelet Count: 137 10*3/uL — ABNORMAL LOW (ref 150–400)
RBC: 2.86 MIL/uL — ABNORMAL LOW (ref 3.87–5.11)
RDW: 14.8 % (ref 11.5–15.5)
WBC Count: 6.8 10*3/uL (ref 4.0–10.5)
nRBC: 0 % (ref 0.0–0.2)

## 2020-11-22 LAB — RETICULOCYTES
Immature Retic Fract: 19.2 % — ABNORMAL HIGH (ref 2.3–15.9)
RBC.: 2.85 MIL/uL — ABNORMAL LOW (ref 3.87–5.11)
Retic Count, Absolute: 43 10*3/uL (ref 19.0–186.0)
Retic Ct Pct: 1.5 % (ref 0.4–3.1)

## 2020-11-22 LAB — CMP (CANCER CENTER ONLY)
ALT: 15 U/L (ref 0–44)
AST: 17 U/L (ref 15–41)
Albumin: 4.1 g/dL (ref 3.5–5.0)
Alkaline Phosphatase: 53 U/L (ref 38–126)
Anion gap: 11 (ref 5–15)
BUN: 33 mg/dL — ABNORMAL HIGH (ref 8–23)
CO2: 21 mmol/L — ABNORMAL LOW (ref 22–32)
Calcium: 9.5 mg/dL (ref 8.9–10.3)
Chloride: 109 mmol/L (ref 98–111)
Creatinine: 1.27 mg/dL — ABNORMAL HIGH (ref 0.44–1.00)
GFR, Estimated: 45 mL/min — ABNORMAL LOW (ref >60)
Glucose, Bld: 124 mg/dL — ABNORMAL HIGH (ref 70–99)
Potassium: 4.3 mmol/L (ref 3.5–5.1)
Sodium: 141 mmol/L (ref 135–145)
Total Bilirubin: 0.4 mg/dL (ref 0.3–1.2)
Total Protein: 6.6 g/dL (ref 6.5–8.1)

## 2020-11-22 MED ORDER — EPOETIN ALFA-EPBX 40000 UNIT/ML IJ SOLN
40000.0000 [IU] | Freq: Once | INTRAMUSCULAR | Status: AC
  Administered 2020-11-22: 40000 [IU] via SUBCUTANEOUS

## 2020-11-22 NOTE — Patient Instructions (Signed)

## 2020-11-22 NOTE — Progress Notes (Signed)
Hematology and Oncology Follow Up Visit  Helen Hardy [id number] [birth year] 70 y.o. 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 She is doing well and will be having a total right knee replacement on 12/12/2020 with Dr. Alvan Dame.  She is ambulating with a cane for added support and is wearing a right ankle brace. Neuropathy in her feet is unchanged.  No falls or syncope to report.  She feels fatigued at times.   No fever, chills, n/v, cough, rash, dizziness, SOB, chest pain, palpitations, abdominal pain or changes in bowel or bladder habits.  No blood loss noted. No bruising or petechiae.  She has maintained a good appetite and is staying well hydrated. Her weight is stable at 217 lbs.   ECOG Performance Status: 1 - Symptomatic but completely ambulatory  Medications:  Allergies as of 11/22/2020      Reactions   Oxycodone Nausea And Vomiting   Tramadol Other (See Comments)   UNKNOWN REACTION      Medication List       Accurate as of Nov 22, 2020  2:56 PM. If you have any questions, ask your nurse or doctor.        estradiol 1 MG tablet Commonly known as: ESTRACE Take 1 mg by mouth daily.   folic acid 1 MG tablet Commonly known as: FOLVITE Take 2 mg by mouth daily.   furosemide 40 MG tablet Commonly known as: LASIX Take 40 mg by mouth daily.   gabapentin 300 MG capsule Commonly known as: NEURONTIN Take 300-600 mg by mouth See admin instructions. 300 mg in the morning and at 2 pm. 600 mg every night   HYDROcodone-acetaminophen 10-325 MG tablet Commonly known as: NORCO Take 1 tablet by mouth every 8 (eight) hours as needed for moderate pain or severe pain.    leflunomide 20 MG tablet Commonly known as: ARAVA Take 20 mg by mouth daily.   methocarbamol 500 MG tablet Commonly known as: ROBAXIN Take 750 mg by mouth in the morning and at bedtime.   multivitamin tablet Take 1 tablet by mouth daily.   olmesartan 20 MG tablet Commonly known as: BENICAR Take 20 mg by mouth daily.   ondansetron 4 MG tablet Commonly known as: ZOFRAN Take 4 mg by mouth every 8 (eight) hours as needed for nausea or vomiting.   Orencia 250 MG injection Generic drug: abatacept See admin instructions.   pantoprazole 40 MG tablet Commonly known as: PROTONIX Take 40 mg by mouth daily.   Potassium Chloride ER 20 MEQ Tbcr Take 20 mEq by mouth daily.   predniSONE 10 MG tablet Commonly known as: DELTASONE Take 10 mg by mouth daily.   triamcinolone cream 0.1 % Commonly known as: KENALOG Apply 1 application topically as needed (for rash).   vitamin B-12 1000 MCG tablet Commonly known as: CYANOCOBALAMIN Take 1,000 mcg by mouth daily.   vitamin C 1000 MG tablet Take 1,000 mg by mouth daily.   Vitamin D3 25 MCG (1000 UT) Caps Take 2,000 Units by mouth daily.       Allergies:  Allergies  Allergen Reactions  . Oxycodone Nausea And Vomiting  . Tramadol Other (See Comments)    UNKNOWN REACTION  Past Medical History, Surgical history, Social history, and Family History were reviewed and updated.  Review of Systems: All other 10 point review of systems is negative.   Physical Exam:  height is 4' 11"  (1.499 m) and weight is 217 lb 12.8 oz (98.8 kg). Her oral temperature is 98.3 F (36.8 C). Her blood pressure is 129/66 and her pulse is 86. Her respiration is 19 and oxygen saturation is 100%.   Wt Readings from Last 3 Encounters:  11/22/20 217 lb 12.8 oz (98.8 kg)  11/19/20 214 lb (97.1 kg)  11/08/20 225 lb (102.1 kg)    Ocular: Sclerae unicteric, pupils equal, round and reactive to light Ear-nose-throat: Oropharynx clear, dentition  fair Lymphatic: No cervical or supraclavicular adenopathy Lungs no rales or rhonchi, good excursion bilaterally Heart regular rate and rhythm, no murmur appreciated Abd soft, nontender, positive bowel sounds MSK no focal spinal tenderness, no joint edema Neuro: non-focal, well-oriented, appropriate affect Breasts: Deferred   Lab Results  Component Value Date   WBC 6.8 11/22/2020   HGB 10.4 (L) 11/22/2020   HCT 32.0 (L) 11/22/2020   MCV 111.9 (H) 11/22/2020   PLT 137 (L) 11/22/2020   Lab Results  Component Value Date   FERRITIN 2,319 (H) 10/10/2020   IRON 135 10/10/2020   TIBC 250 10/10/2020   UIBC 115 (L) 10/10/2020   IRONPCTSAT 54 10/10/2020   Lab Results  Component Value Date   RETICCTPCT 1.5 11/22/2020   RBC 2.86 (L) 11/22/2020   RBC 2.85 (L) 11/22/2020   RETICCTABS 41.0 06/21/2015   No results found for: KPAFRELGTCHN, LAMBDASER, KAPLAMBRATIO No results found for: IGGSERUM, IGA, IGMSERUM No results found for: Odetta Pink, SPEI   Chemistry      Component Value Date/Time   NA 141 11/22/2020 1318   NA 144 04/29/2017 0929   K 4.3 11/22/2020 1318   K 4.0 04/29/2017 0929   CL 109 11/22/2020 1318   CL 103 06/21/2015 0800   CO2 21 (L) 11/22/2020 1318   CO2 25 04/29/2017 0929   BUN 33 (H) 11/22/2020 1318   BUN 17.7 04/29/2017 0929   CREATININE 1.27 (H) 11/22/2020 1318   CREATININE 0.8 04/29/2017 0929      Component Value Date/Time   CALCIUM 9.5 11/22/2020 1318   CALCIUM 9.3 04/29/2017 0929   ALKPHOS 53 11/22/2020 1318   ALKPHOS 55 04/29/2017 0929   AST 17 11/22/2020 1318   AST 19 04/29/2017 0929   ALT 15 11/22/2020 1318   ALT 16 04/29/2017 0929   BILITOT 0.4 11/22/2020 1318   BILITOT 0.43 04/29/2017 0929       Impression and Plan: Helen Hardy is a very pleasant 70yo African American female with 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  She also has multifactorial  anemia.  Iron studies are pending.  ESA given, Hgb 10.4.  Follow-up in another 6 weeks.  She can contact our office with any questions or concerns.   Laverna Peace, NP 5/20/20222:56 PM

## 2020-11-22 NOTE — Telephone Encounter (Signed)
appts made per 11/22/20 los and pt req a calendar be mailed to her, done   [place]

## 2020-11-25 LAB — IRON AND TIBC
Iron: 124 ug/dL (ref 41–142)
Saturation Ratios: 56 % (ref 21–57)
TIBC: 223 ug/dL — ABNORMAL LOW (ref 236–444)
UIBC: 98 ug/dL — ABNORMAL LOW (ref 120–384)

## 2020-11-25 LAB — FERRITIN: Ferritin: 2353 ng/mL — ABNORMAL HIGH (ref 11–307)

## 2020-11-26 DIAGNOSIS — E785 Hyperlipidemia, unspecified: Secondary | ICD-10-CM | POA: Diagnosis not present

## 2020-11-26 DIAGNOSIS — Z01818 Encounter for other preprocedural examination: Secondary | ICD-10-CM | POA: Diagnosis not present

## 2020-11-27 ENCOUNTER — Other Ambulatory Visit (HOSPITAL_COMMUNITY): Payer: Self-pay

## 2020-11-27 NOTE — Progress Notes (Signed)
DUE TO COVID-19 ONLY ONE VISITOR IS ALLOWED TO COME WITH YOU AND STAY IN THE WAITING ROOM ONLY DURING PRE OP AND PROCEDURE DAY OF SURGERY. THE 1 VISITOR  MAY VISIT WITH YOU AFTER SURGERY IN YOUR PRIVATE ROOM DURING VISITING HOURS ONLY!  YOU NEED TO HAVE A COVID 19 TEST ON_6/12/2020 ______ @_______ , THIS TEST MUST BE DONE BEFORE SURGERY,  COVID TESTING SITE [address] [place] [address], IT IS ON THE RIGHT GOING OUT [address] APPROXIMATELY  2 MINUTES PAST [employer] ON THE RIGHT. ONCE YOUR COVID TEST IS COMPLETED,  PLEASE BEGIN THE QUARANTINE INSTRUCTIONS AS OUTLINED IN YOUR HANDOUT.                Helen Hardy  11/27/2020   Your procedure is scheduled on:  12/12/2020   Report to [facility] Main  Entrance   Report to admitting at    [place] AM     Call this number if you have problems the morning of surgery [phone]    REMEMBER: NO  SOLID FOOD CANDY OR GUM AFTER MIDNIGHT. CLEAR LIQUIDS UNTIL   0415am       . NOTHING BY MOUTH EXCEPT CLEAR LIQUIDS UNTIL    . PLEASE FINISH ENSURE DRINK    0415am PER SURGEON ORDER  WHICH NEEDS TO BE COMPLETED AT  0415am     .      CLEAR LIQUID DIET   Foods Allowed                                                                    Coffee and tea, regular and decaf                            Fruit ices (not with fruit pulp)                                      Iced Popsicles                                    Carbonated beverages, regular and diet                                    Cranberry, grape and apple juices Sports drinks like Gatorade Lightly seasoned clear broth or consume(fat free) Sugar, honey syrup ___________________________________________________________________      BRUSH YOUR TEETH MORNING OF SURGERY AND RINSE YOUR MOUTH OUT, NO CHEWING GUM CANDY OR MINTS.     Take these medicines the morning of surgery with A SIP OF WATER:       Protonix, gabapentin  DO NOT TAKE ANY DIABETIC MEDICATIONS  DAY OF YOUR SURGERY                               You may not have any metal on your body including hair pins and  piercings  Do not wear jewelry, make-up, lotions, powders or perfumes, deodorant             Do not wear nail polish on your fingernails.  Do not shave  48 hours prior to surgery.              Men may shave face and neck.   Do not bring valuables to the hospital. [place].  Contacts, dentures or bridgework may not be worn into surgery.  Leave suitcase in the car. After surgery it may be brought to your room.     Patients discharged the day of surgery will not be allowed to drive home. IF YOU ARE HAVING SURGERY AND GOING HOME THE SAME DAY, YOU MUST HAVE AN ADULT TO DRIVE YOU HOME AND BE WITH YOU FOR 24 HOURS. YOU MAY GO HOME BY TAXI OR UBER OR ORTHERWISE, BUT AN ADULT MUST ACCOMPANY YOU HOME AND STAY WITH YOU FOR 24 HOURS.  Name and phone number of your driver:  Special Instructions: N/A              Please read over the following fact sheets you were given: _____________________________________________________________________  [employer] - Preparing for Surgery Before surgery, you can play an important role.  Because skin is not sterile, your skin needs to be as free of germs as possible.  You can reduce the number of germs on your skin by washing with CHG (chlorahexidine gluconate) soap before surgery.  CHG is an antiseptic cleaner which kills germs and bonds with the skin to continue killing germs even after washing. Please DO NOT use if you have an allergy to CHG or antibacterial soaps.  If your skin becomes reddened/irritated stop using the CHG and inform your nurse when you arrive at Short Stay. Do not shave (including legs and underarms) for at least 48 hours prior to the first CHG shower.  You may shave your face/neck. Please follow these instructions carefully:  1.  Shower with CHG Soap the night before  surgery and the  morning of Surgery.  2.  If you choose to wash your hair, wash your hair first as usual with your  normal  shampoo.  3.  After you shampoo, rinse your hair and body thoroughly to remove the  shampoo.                           4.  Use CHG as you would any other liquid soap.  You can apply chg directly  to the skin and wash                       Gently with a scrungie or clean washcloth.  5.  Apply the CHG Soap to your body ONLY FROM THE NECK DOWN.   Do not use on face/ open                           Wound or open sores. Avoid contact with eyes, ears mouth and genitals (private parts).                       Wash face,  Genitals (private parts) with your normal soap.             6.  Wash thoroughly, paying special attention to the area where your surgery  will be performed.  7.  Thoroughly rinse your body with warm water from the neck down.  8.  DO NOT shower/wash with your normal soap after using and rinsing off  the CHG Soap.                9.  Pat yourself dry with a clean towel.            10.  Wear clean pajamas.            11.  Place clean sheets on your bed the night of your first shower and do not  sleep with pets. Day of Surgery : Do not apply any lotions/deodorants the morning of surgery.  Please wear clean clothes to the hospital/surgery center.  FAILURE TO FOLLOW THESE INSTRUCTIONS MAY RESULT IN THE CANCELLATION OF YOUR SURGERY PATIENT SIGNATURE_________________________________  NURSE SIGNATURE__________________________________  ________________________________________________________________________

## 2020-12-03 ENCOUNTER — Ambulatory Visit
Admission: RE | Admit: 2020-12-03 | Discharge: 2020-12-03 | Disposition: A | Discharge status: Home / Self Care | Payer: Medicare Other | Source: Ambulatory Visit | Attending: Pulmonary Disease | Admitting: Pulmonary Disease

## 2020-12-03 ENCOUNTER — Other Ambulatory Visit (HOSPITAL_COMMUNITY)
Admission: RE | Admit: 2020-12-03 | Discharge: 2020-12-03 | Disposition: A | Discharge status: Home / Self Care | Payer: Medicare Other | Source: Ambulatory Visit | Attending: Pulmonary Disease | Admitting: Pulmonary Disease

## 2020-12-03 DIAGNOSIS — U071 COVID-19: Secondary | ICD-10-CM | POA: Insufficient documentation

## 2020-12-03 DIAGNOSIS — I700 Atherosclerosis of aorta: Secondary | ICD-10-CM | POA: Diagnosis not present

## 2020-12-03 DIAGNOSIS — J8410 Pulmonary fibrosis, unspecified: Secondary | ICD-10-CM

## 2020-12-03 DIAGNOSIS — J849 Interstitial pulmonary disease, unspecified: Secondary | ICD-10-CM | POA: Diagnosis not present

## 2020-12-03 DIAGNOSIS — Z01812 Encounter for preprocedural laboratory examination: Secondary | ICD-10-CM | POA: Insufficient documentation

## 2020-12-03 DIAGNOSIS — R918 Other nonspecific abnormal finding of lung field: Secondary | ICD-10-CM | POA: Diagnosis not present

## 2020-12-03 LAB — SARS CORONAVIRUS 2 (TAT 6-24 HRS): SARS Coronavirus 2: POSITIVE — AB

## 2020-12-04 ENCOUNTER — Telehealth (HOSPITAL_COMMUNITY): Payer: Self-pay

## 2020-12-04 ENCOUNTER — Encounter (HOSPITAL_COMMUNITY)
Admission: RE | Admit: 2020-12-04 | Discharge: 2020-12-04 | Disposition: A | Discharge status: Home / Self Care | Payer: Medicare Other | Source: Ambulatory Visit | Attending: Orthopedic Surgery | Admitting: Orthopedic Surgery

## 2020-12-04 ENCOUNTER — Other Ambulatory Visit: Payer: Medicare Other

## 2020-12-04 ENCOUNTER — Other Ambulatory Visit: Payer: Self-pay

## 2020-12-04 NOTE — Telephone Encounter (Signed)
[phone] - Contacted surgeon's office - spoke to Ashley(his PA) - advised of patient's Positive Covid19 lab results. MBM

## 2020-12-04 NOTE — Progress Notes (Signed)
PT scheduled fo preop on 12/04/20.  Pt had covid test in prep for PFT on 12/03/20.  Results noted in epic ( positivef)  and that note had been made where surgeon office had been contacted.  Pt had arrived in hospital and made it to waiting room.  Volunteer desk was notified.  Pt called nurse on cell phone and nurse informed pt that she was covid positive and needed to leave building immediately and for her to call PCP and surgeon office.  PT voiced understanding.  Called out front and made Admitting aware ( spoke with Horris Latino) who will inform Janett Billow who checked her in.  Made Konrad Felix, [facility] aware of above.  She stated she would notify surgeon office.

## 2020-12-05 ENCOUNTER — Other Ambulatory Visit: Payer: Self-pay

## 2020-12-05 ENCOUNTER — Encounter (HOSPITAL_COMMUNITY): Payer: Self-pay | Admitting: Orthopedic Surgery

## 2020-12-05 NOTE — Progress Notes (Addendum)
COVID Vaccine Completed: Yes Date COVID Vaccine completed: x2 Has received booster: x2 COVID vaccine manufacturer: Pfizer      Date of COVID positive in last 90 days: 11/08/2020 in epic  PCP - Deland Pretty, MD Oncologist: Volanda Napoleon, MD Cardiologist - N/A  Chest x-ray - 11/08/20 in epic EKG - 11/08/20 in epic Stress Test - N/A ECHO - N/A Cardiac Cath - N/A Pacemaker/ICD device last checked:N/A  Sleep Study - N/A CPAP - N/A  Fasting Blood Sugar - N/A Checks Blood Sugar _N/A____ times a day  Blood Thinner Instructions: N/A Aspirin Instructions: N/A Last Dose: N/A  Activity level:  activities of daily living without stopping and without symptoms    Anesthesia review: Hypertension, Non Hodgkins Lyphoma  Patient denies shortness of breath, fever, cough and chest pain at PAT appointment   Patient verbalized understanding of instructions that were given to them at the PAT appointment. Patient was also instructed that they will need to review over the PAT instructions again at home before surgery.

## 2020-12-11 NOTE — Anesthesia Preprocedure Evaluation (Addendum)
Anesthesia Evaluation  Patient identified by MRN, date of birth, ID band Patient awake    Reviewed: Allergy & Precautions, NPO status , Patient's Chart, lab work & pertinent test results  History of Anesthesia Complications Negative for: history of anesthetic complications  Airway Mallampati: II  TM Distance: >3 FB Neck ROM: Full    Dental  (+) Missing, Dental Advisory Given   Pulmonary  12/03/2020 SARS coronavirus positive, Dx 11/08/2020 Chronic hoarseness   breath sounds clear to auscultation       Cardiovascular hypertension, Pt. on medications (-) angina Rhythm:Regular Rate:Normal  10/2020 Cardiac CT: Patient's total coronary artery calcium score is 0, a very low (but nonzero) risk of major adverse cardiovascular events over the next 10 years   Neuro/Psych negative neurological ROS     GI/Hepatic Neg liver ROS, GERD  Medicated and Controlled,  Endo/Other  Morbid obesity  Renal/GU Renal InsufficiencyRenal disease     Musculoskeletal  (+) Arthritis  (Took Prednisone today), Rheumatoid disorders and steroids,    Abdominal (+) + obese,   Peds  Hematology  (+) Blood dyscrasia (Hb 10.9), anemia , H/o non-Hodgkin's lymphoma   Anesthesia Other Findings   Reproductive/Obstetrics                           Anesthesia Physical Anesthesia Plan  ASA: III  Anesthesia Plan: Spinal   Post-op Pain Management:  Regional for Post-op pain   Induction:   PONV Risk Score and Plan: 2 and Ondansetron and Dexamethasone  Airway Management Planned: Natural Airway and Simple Face Mask  Additional Equipment: None  Intra-op Plan:   Post-operative Plan:   Informed Consent: I have reviewed the patients History and Physical, chart, labs and discussed the procedure including the risks, benefits and alternatives for the proposed anesthesia with the patient or authorized representative who has indicated his/her  understanding and acceptance.     Dental advisory given  Plan Discussed with: CRNA and Surgeon  Anesthesia Plan Comments: (Plan routine monitors, SAB with adductor canal block for post op analgesia)       Anesthesia Quick Evaluation

## 2020-12-12 ENCOUNTER — Inpatient Hospital Stay (HOSPITAL_COMMUNITY)
Admission: RE | Admit: 2020-12-12 | Discharge: 2020-12-17 | DRG: 467 | Disposition: A | Discharge status: Skilled Nursing Facility | Payer: Medicare Other | Attending: Orthopedic Surgery | Admitting: Orthopedic Surgery

## 2020-12-12 ENCOUNTER — Encounter (HOSPITAL_COMMUNITY)
Admission: RE | Disposition: A | Discharge status: Skilled Nursing Facility | Payer: Self-pay | Source: Home / Self Care | Attending: Orthopedic Surgery

## 2020-12-12 ENCOUNTER — Inpatient Hospital Stay (HOSPITAL_COMMUNITY): Payer: Medicare Other | Admitting: Physician Assistant

## 2020-12-12 ENCOUNTER — Encounter (HOSPITAL_COMMUNITY): Payer: Self-pay | Admitting: Orthopedic Surgery

## 2020-12-12 ENCOUNTER — Inpatient Hospital Stay (HOSPITAL_COMMUNITY): Payer: Medicare Other | Admitting: Anesthesiology

## 2020-12-12 ENCOUNTER — Other Ambulatory Visit: Payer: Self-pay

## 2020-12-12 DIAGNOSIS — Z8719 Personal history of other diseases of the digestive system: Secondary | ICD-10-CM | POA: Diagnosis not present

## 2020-12-12 DIAGNOSIS — D631 Anemia in chronic kidney disease: Secondary | ICD-10-CM | POA: Diagnosis present

## 2020-12-12 DIAGNOSIS — G8918 Other acute postprocedural pain: Secondary | ICD-10-CM | POA: Diagnosis not present

## 2020-12-12 DIAGNOSIS — M810 Age-related osteoporosis without current pathological fracture: Secondary | ICD-10-CM | POA: Diagnosis present

## 2020-12-12 DIAGNOSIS — M6281 Muscle weakness (generalized): Secondary | ICD-10-CM | POA: Diagnosis not present

## 2020-12-12 DIAGNOSIS — I129 Hypertensive chronic kidney disease with stage 1 through stage 4 chronic kidney disease, or unspecified chronic kidney disease: Secondary | ICD-10-CM | POA: Diagnosis present

## 2020-12-12 DIAGNOSIS — Z90710 Acquired absence of both cervix and uterus: Secondary | ICD-10-CM

## 2020-12-12 DIAGNOSIS — R262 Difficulty in walking, not elsewhere classified: Secondary | ICD-10-CM | POA: Diagnosis not present

## 2020-12-12 DIAGNOSIS — T84032A Mechanical loosening of internal right knee prosthetic joint, initial encounter: Secondary | ICD-10-CM | POA: Diagnosis present

## 2020-12-12 DIAGNOSIS — Z6841 Body Mass Index (BMI) 40.0 and over, adult: Secondary | ICD-10-CM | POA: Diagnosis not present

## 2020-12-12 DIAGNOSIS — Z888 Allergy status to other drugs, medicaments and biological substances status: Secondary | ICD-10-CM

## 2020-12-12 DIAGNOSIS — Z471 Aftercare following joint replacement surgery: Secondary | ICD-10-CM | POA: Diagnosis not present

## 2020-12-12 DIAGNOSIS — G8929 Other chronic pain: Secondary | ICD-10-CM | POA: Diagnosis present

## 2020-12-12 DIAGNOSIS — Z8616 Personal history of COVID-19: Secondary | ICD-10-CM | POA: Diagnosis not present

## 2020-12-12 DIAGNOSIS — Y792 Prosthetic and other implants, materials and accessory orthopedic devices associated with adverse incidents: Secondary | ICD-10-CM | POA: Diagnosis present

## 2020-12-12 DIAGNOSIS — Z7401 Bed confinement status: Secondary | ICD-10-CM | POA: Diagnosis not present

## 2020-12-12 DIAGNOSIS — Z96651 Presence of right artificial knee joint: Secondary | ICD-10-CM | POA: Diagnosis not present

## 2020-12-12 DIAGNOSIS — R41841 Cognitive communication deficit: Secondary | ICD-10-CM | POA: Diagnosis not present

## 2020-12-12 DIAGNOSIS — Z885 Allergy status to narcotic agent status: Secondary | ICD-10-CM

## 2020-12-12 DIAGNOSIS — R5381 Other malaise: Secondary | ICD-10-CM | POA: Diagnosis not present

## 2020-12-12 DIAGNOSIS — K219 Gastro-esophageal reflux disease without esophagitis: Secondary | ICD-10-CM | POA: Diagnosis present

## 2020-12-12 DIAGNOSIS — R531 Weakness: Secondary | ICD-10-CM | POA: Diagnosis not present

## 2020-12-12 DIAGNOSIS — T84092A Other mechanical complication of internal right knee prosthesis, initial encounter: Secondary | ICD-10-CM | POA: Diagnosis not present

## 2020-12-12 DIAGNOSIS — M069 Rheumatoid arthritis, unspecified: Secondary | ICD-10-CM | POA: Diagnosis present

## 2020-12-12 DIAGNOSIS — R1312 Dysphagia, oropharyngeal phase: Secondary | ICD-10-CM | POA: Diagnosis not present

## 2020-12-12 DIAGNOSIS — N183 Chronic kidney disease, stage 3 unspecified: Secondary | ICD-10-CM | POA: Diagnosis present

## 2020-12-12 DIAGNOSIS — M6259 Muscle wasting and atrophy, not elsewhere classified, multiple sites: Secondary | ICD-10-CM | POA: Diagnosis not present

## 2020-12-12 DIAGNOSIS — Z8572 Personal history of non-Hodgkin lymphomas: Secondary | ICD-10-CM | POA: Diagnosis not present

## 2020-12-12 HISTORY — DX: Rheumatoid arthritis, unspecified: M06.9

## 2020-12-12 HISTORY — DX: Personal history of other diseases of the digestive system: Z87.19

## 2020-12-12 HISTORY — DX: Non-Hodgkin lymphoma, unspecified, unspecified site: C85.90

## 2020-12-12 HISTORY — PX: TOTAL KNEE REVISION: SHX996

## 2020-12-12 LAB — BASIC METABOLIC PANEL
Anion gap: 12 (ref 5–15)
BUN: 27 mg/dL — ABNORMAL HIGH (ref 8–23)
CO2: 24 mmol/L (ref 22–32)
Calcium: 9.3 mg/dL (ref 8.9–10.3)
Chloride: 108 mmol/L (ref 98–111)
Creatinine, Ser: 1.08 mg/dL — ABNORMAL HIGH (ref 0.44–1.00)
GFR, Estimated: 55 mL/min — ABNORMAL LOW (ref >60)
Glucose, Bld: 104 mg/dL — ABNORMAL HIGH (ref 70–99)
Potassium: 4.3 mmol/L (ref 3.5–5.1)
Sodium: 144 mmol/L (ref 135–145)

## 2020-12-12 LAB — SURGICAL PCR SCREEN
MRSA, PCR: POSITIVE — AB
Staphylococcus aureus: POSITIVE — AB

## 2020-12-12 LAB — CBC
HCT: 34.8 % — ABNORMAL LOW (ref 36.0–46.0)
Hemoglobin: 10.9 g/dL — ABNORMAL LOW (ref 12.0–15.0)
MCH: 36.3 pg — ABNORMAL HIGH (ref 26.0–34.0)
MCHC: 31.3 g/dL (ref 30.0–36.0)
MCV: 116 fL — ABNORMAL HIGH (ref 80.0–100.0)
Platelets: 154 10*3/uL (ref 150–400)
RBC: 3 MIL/uL — ABNORMAL LOW (ref 3.87–5.11)
RDW: 16.9 % — ABNORMAL HIGH (ref 11.5–15.5)
WBC: 6.5 10*3/uL (ref 4.0–10.5)
nRBC: 0.3 % — ABNORMAL HIGH (ref 0.0–0.2)

## 2020-12-12 SURGERY — TOTAL KNEE REVISION
Anesthesia: Spinal | Site: Knee | Laterality: Right

## 2020-12-12 MED ORDER — ACETAMINOPHEN 500 MG PO TABS
1000.0000 mg | ORAL_TABLET | Freq: Once | ORAL | Status: AC
  Administered 2020-12-12: 1000 mg via ORAL
  Filled 2020-12-12: qty 2

## 2020-12-12 MED ORDER — ASPIRIN 81 MG PO CHEW
81.0000 mg | CHEWABLE_TABLET | Freq: Two times a day (BID) | ORAL | Status: DC
  Administered 2020-12-12 – 2020-12-17 (×10): 81 mg via ORAL
  Filled 2020-12-12 (×10): qty 1

## 2020-12-12 MED ORDER — BISACODYL 10 MG RE SUPP: 10.0000 mg | Freq: Every day | RECTAL | Status: DC | PRN

## 2020-12-12 MED ORDER — PHENYLEPHRINE HCL (PRESSORS) 10 MG/ML IV SOLN
INTRAVENOUS | Status: AC
  Filled 2020-12-12: qty 1

## 2020-12-12 MED ORDER — DIPHENHYDRAMINE HCL 12.5 MG/5ML PO ELIX: 12.5000 mg | ORAL_SOLUTION | ORAL | Status: DC | PRN

## 2020-12-12 MED ORDER — ORAL CARE MOUTH RINSE: 15.0000 mL | Freq: Once | OROMUCOSAL | Status: AC

## 2020-12-12 MED ORDER — MIDAZOLAM HCL 2 MG/2ML IJ SOLN
INTRAMUSCULAR | Status: AC
  Filled 2020-12-12: qty 2

## 2020-12-12 MED ORDER — SODIUM CHLORIDE (PF) 0.9 % IJ SOLN
INTRAMUSCULAR | Status: AC
  Filled 2020-12-12: qty 30

## 2020-12-12 MED ORDER — PROPOFOL 10 MG/ML IV BOLUS
INTRAVENOUS | Status: DC | PRN
  Administered 2020-12-12 (×2): 30 mg via INTRAVENOUS
  Administered 2020-12-12: 20 mg via INTRAVENOUS

## 2020-12-12 MED ORDER — ROPIVACAINE HCL 7.5 MG/ML IJ SOLN
INTRAMUSCULAR | Status: DC | PRN
  Administered 2020-12-12: 20 mL via PERINEURAL

## 2020-12-12 MED ORDER — POVIDONE-IODINE 10 % EX SWAB
2.0000 "application " | Freq: Once | CUTANEOUS | Status: AC
  Administered 2020-12-12: 2 via TOPICAL

## 2020-12-12 MED ORDER — TRANEXAMIC ACID-NACL 1000-0.7 MG/100ML-% IV SOLN
1000.0000 mg | Freq: Once | INTRAVENOUS | Status: AC
  Administered 2020-12-12: 1000 mg via INTRAVENOUS
  Filled 2020-12-12: qty 100

## 2020-12-12 MED ORDER — HYDROMORPHONE HCL 1 MG/ML IJ SOLN: 0.2500 mg | INTRAMUSCULAR | Status: DC | PRN

## 2020-12-12 MED ORDER — ACETAMINOPHEN 325 MG PO TABS
325.0000 mg | ORAL_TABLET | Freq: Four times a day (QID) | ORAL | Status: DC | PRN
Start: 2020-12-13 — End: 2020-12-18

## 2020-12-12 MED ORDER — GABAPENTIN 300 MG PO CAPS: 300.0000 mg | ORAL_CAPSULE | ORAL | Status: DC

## 2020-12-12 MED ORDER — OXYCODONE HCL 5 MG PO TABS
5.0000 mg | ORAL_TABLET | Freq: Once | ORAL | Status: DC | PRN
Start: 2020-12-12 — End: 2020-12-12

## 2020-12-12 MED ORDER — FERROUS SULFATE 325 (65 FE) MG PO TABS
325.0000 mg | ORAL_TABLET | Freq: Three times a day (TID) | ORAL | Status: DC
  Administered 2020-12-12 – 2020-12-17 (×16): 325 mg via ORAL
  Filled 2020-12-12 (×16): qty 1

## 2020-12-12 MED ORDER — BUPIVACAINE-EPINEPHRINE 0.25% -1:200000 IJ SOLN
INTRAMUSCULAR | Status: DC | PRN
  Administered 2020-12-12: 30 mL

## 2020-12-12 MED ORDER — PHENOL 1.4 % MT LIQD
1.0000 | OROMUCOSAL | Status: DC | PRN
Start: 2020-12-12 — End: 2020-12-18

## 2020-12-12 MED ORDER — OXYCODONE HCL 5 MG/5ML PO SOLN: 5.0000 mg | Freq: Once | ORAL | Status: DC | PRN

## 2020-12-12 MED ORDER — LACTATED RINGERS IV SOLN: INTRAVENOUS | Status: DC

## 2020-12-12 MED ORDER — MIDAZOLAM HCL 2 MG/2ML IJ SOLN: 0.5000 mg | Freq: Once | INTRAMUSCULAR | Status: DC | PRN

## 2020-12-12 MED ORDER — MEPERIDINE HCL 50 MG/ML IJ SOLN
6.2500 mg | INTRAMUSCULAR | Status: DC | PRN
Start: 2020-12-12 — End: 2020-12-12

## 2020-12-12 MED ORDER — METHOCARBAMOL 500 MG PO TABS
500.0000 mg | ORAL_TABLET | Freq: Four times a day (QID) | ORAL | Status: DC | PRN
  Administered 2020-12-13 – 2020-12-17 (×10): 500 mg via ORAL
  Filled 2020-12-12 (×10): qty 1

## 2020-12-12 MED ORDER — TRANEXAMIC ACID-NACL 1000-0.7 MG/100ML-% IV SOLN
1000.0000 mg | INTRAVENOUS | Status: AC
  Administered 2020-12-12: 1000 mg via INTRAVENOUS
  Filled 2020-12-12: qty 100

## 2020-12-12 MED ORDER — HYDROMORPHONE HCL 1 MG/ML IJ SOLN
0.5000 mg | INTRAMUSCULAR | Status: DC | PRN
  Administered 2020-12-12 – 2020-12-15 (×8): 1 mg via INTRAVENOUS
  Filled 2020-12-12 (×8): qty 1

## 2020-12-12 MED ORDER — OXYCODONE HCL 5 MG PO TABS
5.0000 mg | ORAL_TABLET | ORAL | Status: DC | PRN
  Administered 2020-12-12 – 2020-12-15 (×5): 10 mg via ORAL
  Filled 2020-12-12 (×12): qty 2

## 2020-12-12 MED ORDER — SODIUM CHLORIDE 0.9 % IV SOLN: INTRAVENOUS | Status: DC

## 2020-12-12 MED ORDER — METHOCARBAMOL 1000 MG/10ML IJ SOLN
500.0000 mg | Freq: Four times a day (QID) | INTRAMUSCULAR | Status: DC | PRN
  Administered 2020-12-12: 500 mg via INTRAVENOUS
  Filled 2020-12-12: qty 5
  Filled 2020-12-12: qty 500

## 2020-12-12 MED ORDER — ONDANSETRON HCL 4 MG PO TABS
4.0000 mg | ORAL_TABLET | Freq: Four times a day (QID) | ORAL | Status: DC | PRN
Start: 2020-12-12 — End: 2020-12-18
  Administered 2020-12-13: 4 mg via ORAL
  Filled 2020-12-12: qty 1

## 2020-12-12 MED ORDER — CEFAZOLIN SODIUM-DEXTROSE 2-4 GM/100ML-% IV SOLN
2.0000 g | INTRAVENOUS | Status: AC
  Administered 2020-12-12: 2 g via INTRAVENOUS
  Filled 2020-12-12: qty 100

## 2020-12-12 MED ORDER — SODIUM CHLORIDE 0.9 % IR SOLN
Status: DC | PRN
  Administered 2020-12-12: 3000 mL

## 2020-12-12 MED ORDER — PHENYLEPHRINE HCL-NACL 10-0.9 MG/250ML-% IV SOLN
INTRAVENOUS | Status: DC | PRN
  Administered 2020-12-12: 50 ug/min via INTRAVENOUS

## 2020-12-12 MED ORDER — BUPIVACAINE-EPINEPHRINE (PF) 0.25% -1:200000 IJ SOLN
INTRAMUSCULAR | Status: AC
  Filled 2020-12-12: qty 30

## 2020-12-12 MED ORDER — EPHEDRINE 5 MG/ML INJ
INTRAVENOUS | Status: AC
  Filled 2020-12-12: qty 10

## 2020-12-12 MED ORDER — FUROSEMIDE 40 MG PO TABS
40.0000 mg | ORAL_TABLET | Freq: Every morning | ORAL | Status: DC
  Administered 2020-12-13 – 2020-12-17 (×3): 40 mg via ORAL
  Filled 2020-12-12 (×5): qty 1

## 2020-12-12 MED ORDER — MENTHOL 3 MG MT LOZG: 1.0000 | LOZENGE | OROMUCOSAL | Status: DC | PRN

## 2020-12-12 MED ORDER — POLYETHYLENE GLYCOL 3350 17 G PO PACK
17.0000 g | PACK | Freq: Every day | ORAL | Status: DC | PRN
  Administered 2020-12-13: 17 g via ORAL
  Filled 2020-12-12: qty 1

## 2020-12-12 MED ORDER — DOCUSATE SODIUM 100 MG PO CAPS
100.0000 mg | ORAL_CAPSULE | Freq: Two times a day (BID) | ORAL | Status: DC
  Administered 2020-12-12 – 2020-12-17 (×9): 100 mg via ORAL
  Filled 2020-12-12 (×9): qty 1

## 2020-12-12 MED ORDER — OXYCODONE HCL 5 MG PO TABS
10.0000 mg | ORAL_TABLET | ORAL | Status: DC | PRN
  Administered 2020-12-13: 10 mg via ORAL

## 2020-12-12 MED ORDER — PROPOFOL 1000 MG/100ML IV EMUL
INTRAVENOUS | Status: AC
  Filled 2020-12-12: qty 100

## 2020-12-12 MED ORDER — CHLORHEXIDINE GLUCONATE 0.12 % MT SOLN
15.0000 mL | Freq: Once | OROMUCOSAL | Status: AC
  Administered 2020-12-12: 15 mL via OROMUCOSAL

## 2020-12-12 MED ORDER — IRBESARTAN 150 MG PO TABS
150.0000 mg | ORAL_TABLET | Freq: Every day | ORAL | Status: DC
  Administered 2020-12-13 – 2020-12-17 (×5): 150 mg via ORAL
  Filled 2020-12-12 (×5): qty 1

## 2020-12-12 MED ORDER — VANCOMYCIN HCL 1000 MG IV SOLR
INTRAVENOUS | Status: AC
  Filled 2020-12-12: qty 3000

## 2020-12-12 MED ORDER — SODIUM CHLORIDE (PF) 0.9 % IJ SOLN
INTRAMUSCULAR | Status: DC | PRN
  Administered 2020-12-12: 30 mL

## 2020-12-12 MED ORDER — KETOROLAC TROMETHAMINE 30 MG/ML IJ SOLN
INTRAMUSCULAR | Status: DC | PRN
  Administered 2020-12-12: 30 mg

## 2020-12-12 MED ORDER — METOCLOPRAMIDE HCL 5 MG/ML IJ SOLN: 5.0000 mg | Freq: Three times a day (TID) | INTRAMUSCULAR | Status: DC | PRN

## 2020-12-12 MED ORDER — CEFAZOLIN SODIUM-DEXTROSE 2-4 GM/100ML-% IV SOLN
2.0000 g | Freq: Four times a day (QID) | INTRAVENOUS | Status: AC
  Administered 2020-12-12 (×2): 2 g via INTRAVENOUS
  Filled 2020-12-12 (×2): qty 100

## 2020-12-12 MED ORDER — BUPIVACAINE IN DEXTROSE 0.75-8.25 % IT SOLN
INTRATHECAL | Status: DC | PRN
  Administered 2020-12-12: 12 mg via INTRATHECAL

## 2020-12-12 MED ORDER — POTASSIUM CHLORIDE CRYS ER 20 MEQ PO TBCR
20.0000 meq | EXTENDED_RELEASE_TABLET | Freq: Every morning | ORAL | Status: DC
  Administered 2020-12-13 – 2020-12-17 (×3): 20 meq via ORAL
  Filled 2020-12-12 (×4): qty 1

## 2020-12-12 MED ORDER — METOCLOPRAMIDE HCL 5 MG PO TABS: 5.0000 mg | ORAL_TABLET | Freq: Three times a day (TID) | ORAL | Status: DC | PRN

## 2020-12-12 MED ORDER — GABAPENTIN 300 MG PO CAPS
600.0000 mg | ORAL_CAPSULE | Freq: Every day | ORAL | Status: DC
  Administered 2020-12-12 – 2020-12-16 (×5): 600 mg via ORAL
  Filled 2020-12-12 (×5): qty 2

## 2020-12-12 MED ORDER — 0.9 % SODIUM CHLORIDE (POUR BTL) OPTIME
TOPICAL | Status: DC | PRN
  Administered 2020-12-12: 1000 mL

## 2020-12-12 MED ORDER — PROMETHAZINE HCL 25 MG/ML IJ SOLN: 6.2500 mg | INTRAMUSCULAR | Status: DC | PRN

## 2020-12-12 MED ORDER — PREDNISONE 5 MG PO TABS
10.0000 mg | ORAL_TABLET | Freq: Every day | ORAL | Status: DC
  Administered 2020-12-13 – 2020-12-17 (×5): 10 mg via ORAL
  Filled 2020-12-12 (×5): qty 2

## 2020-12-12 MED ORDER — ONDANSETRON HCL 4 MG/2ML IJ SOLN
4.0000 mg | Freq: Four times a day (QID) | INTRAMUSCULAR | Status: DC | PRN
  Administered 2020-12-17: 4 mg via INTRAVENOUS
  Filled 2020-12-12: qty 2

## 2020-12-12 MED ORDER — DEXAMETHASONE SODIUM PHOSPHATE 10 MG/ML IJ SOLN: 8.0000 mg | Freq: Once | INTRAMUSCULAR | Status: DC

## 2020-12-12 MED ORDER — GABAPENTIN 300 MG PO CAPS
300.0000 mg | ORAL_CAPSULE | Freq: Two times a day (BID) | ORAL | Status: DC
  Administered 2020-12-12 – 2020-12-17 (×11): 300 mg via ORAL
  Filled 2020-12-12 (×11): qty 1

## 2020-12-12 MED ORDER — PROPOFOL 500 MG/50ML IV EMUL
INTRAVENOUS | Status: DC | PRN
  Administered 2020-12-12: 100 ug/kg/min via INTRAVENOUS

## 2020-12-12 MED ORDER — DEXAMETHASONE SODIUM PHOSPHATE 10 MG/ML IJ SOLN
10.0000 mg | Freq: Once | INTRAMUSCULAR | Status: AC
  Administered 2020-12-13: 10 mg via INTRAVENOUS
  Filled 2020-12-12: qty 1

## 2020-12-12 MED ORDER — DEXAMETHASONE SODIUM PHOSPHATE 10 MG/ML IJ SOLN
INTRAMUSCULAR | Status: DC | PRN
  Administered 2020-12-12: 10 mg via INTRAVENOUS

## 2020-12-12 MED ORDER — MIDAZOLAM HCL 5 MG/5ML IJ SOLN
INTRAMUSCULAR | Status: DC | PRN
  Administered 2020-12-12: 2 mg via INTRAVENOUS

## 2020-12-12 MED ORDER — FENTANYL CITRATE (PF) 100 MCG/2ML IJ SOLN
INTRAMUSCULAR | Status: AC
  Filled 2020-12-12: qty 2

## 2020-12-12 MED ORDER — KETOROLAC TROMETHAMINE 30 MG/ML IJ SOLN
INTRAMUSCULAR | Status: AC
  Filled 2020-12-12: qty 1

## 2020-12-12 MED ORDER — FENTANYL CITRATE (PF) 100 MCG/2ML IJ SOLN
INTRAMUSCULAR | Status: DC | PRN
  Administered 2020-12-12 (×2): 50 ug via INTRAVENOUS

## 2020-12-12 MED ORDER — ONDANSETRON HCL 4 MG/2ML IJ SOLN
INTRAMUSCULAR | Status: DC | PRN
  Administered 2020-12-12: 4 mg via INTRAVENOUS

## 2020-12-12 SURGICAL SUPPLY — 75 items
ADH SKN CLS APL DERMABOND .7 (GAUZE/BANDAGES/DRESSINGS) ×1
ATTUNE KNEE SYSTEM REVISION DISTAL FEMORAL AUGMENT (Orthopedic Implant) ×2 IMPLANT
ATUNE CEMENTED STEM 16X80 ×3 IMPLANT
AUG DIST FEM ATTUNE 3X8 (Joint) ×3 IMPLANT
AUG FEM SZ3 4 REV DIST STRL LF (Miscellaneous) ×1 IMPLANT
AUG FEM SZ3 8 REV DIST STRL LF (Joint) ×1 IMPLANT
AUGMENT DIST FEM ATTUNE 3X8 (Joint) IMPLANT
AUGMENT FEMORAL DISTAL SZ3 4 (Miscellaneous) IMPLANT
BAG DECANTER FOR FLEXI CONT (MISCELLANEOUS) IMPLANT
BAG SPEC THK2 15X12 ZIP CLS (MISCELLANEOUS) ×1
BAG ZIPLOCK 12X15 (MISCELLANEOUS) ×2 IMPLANT
BLADE SAW SGTL 11.0X1.19X90.0M (BLADE) ×2 IMPLANT
BLADE SAW SGTL 13.0X1.19X90.0M (BLADE) ×3 IMPLANT
BLADE SAW SGTL 81X20 HD (BLADE) ×3 IMPLANT
BLADE SURG SZ10 CARB STEEL (BLADE) ×6 IMPLANT
BNDG ELASTIC 6X5.8 VLCR STR LF (GAUZE/BANDAGES/DRESSINGS) ×3 IMPLANT
BRUSH FEMORAL CANAL (MISCELLANEOUS) ×2 IMPLANT
BSPLAT TIB 3 CMNT REV ROT PLAT (Knees) ×1 IMPLANT
CEMENT HV SMART SET (Cement) ×4 IMPLANT
COMP FEM ATTUNE CRS CEM RT SZ3 (Femur) ×3 IMPLANT
COMPONENT FEM ATN CR CEM RTSZ3 (Femur) IMPLANT
COVER SURGICAL LIGHT HANDLE (MISCELLANEOUS) ×3 IMPLANT
COVER WAND RF STERILE (DRAPES) IMPLANT
CUFF TOURN SGL QUICK 34 (TOURNIQUET CUFF) ×3
CUFF TRNQT CYL 34X4.125X (TOURNIQUET CUFF) ×1 IMPLANT
DECANTER SPIKE VIAL GLASS SM (MISCELLANEOUS) IMPLANT
DERMABOND ADVANCED (GAUZE/BANDAGES/DRESSINGS) ×2
DERMABOND ADVANCED .7 DNX12 (GAUZE/BANDAGES/DRESSINGS) ×1 IMPLANT
DRAPE U-SHAPE 47X51 STRL (DRAPES) ×3 IMPLANT
DRESSING AQUACEL AG SP 3.5X10 (GAUZE/BANDAGES/DRESSINGS) IMPLANT
DRSG AQUACEL AG ADV 3.5X10 (GAUZE/BANDAGES/DRESSINGS) ×3 IMPLANT
DRSG AQUACEL AG ADV 3.5X14 (GAUZE/BANDAGES/DRESSINGS) ×2 IMPLANT
DRSG AQUACEL AG SP 3.5X10 (GAUZE/BANDAGES/DRESSINGS)
DSTL FMRL AGMT SZ3 4 (Miscellaneous) ×3 IMPLANT
DURAPREP 26ML APPLICATOR (WOUND CARE) ×6 IMPLANT
ELECT REM PT RETURN 15FT ADLT (MISCELLANEOUS) ×3 IMPLANT
GAUZE SPONGE 2X2 8PLY STRL LF (GAUZE/BANDAGES/DRESSINGS) IMPLANT
GLOVE SURG UNDER POLY LF SZ7.5 (GLOVE) ×6 IMPLANT
GOWN STRL REUS W/TWL LRG LVL3 (GOWN DISPOSABLE) ×3 IMPLANT
HANDPIECE INTERPULSE COAX TIP (DISPOSABLE) ×3
HOLDER FOLEY CATH W/STRAP (MISCELLANEOUS) ×2 IMPLANT
INSERT TIB ATTUNE RP SZ3X14 (Insert) ×2 IMPLANT
INSERT TIB CMT ATTUNE RP SZ3 (Knees) ×2 IMPLANT
IRRIGATION SURGIPHOR STRL (IV SOLUTION) IMPLANT
KIT TURNOVER KIT A (KITS) ×3 IMPLANT
MANIFOLD NEPTUNE II (INSTRUMENTS) ×3 IMPLANT
NDL SAFETY ECLIPSE 18X1.5 (NEEDLE) IMPLANT
NEEDLE HYPO 18GX1.5 SHARP (NEEDLE) ×3
NS IRRIG 1000ML POUR BTL (IV SOLUTION) ×3 IMPLANT
PACK TOTAL KNEE CUSTOM (KITS) ×3 IMPLANT
PENCIL SMOKE EVACUATOR (MISCELLANEOUS) ×3 IMPLANT
PIN FIX SIGMA LCS THRD HI (PIN) ×2 IMPLANT
PROTECTOR NERVE ULNAR (MISCELLANEOUS) ×3 IMPLANT
RESTRICTOR CEMENT SZ 5 C-STEM (Cement) ×2 IMPLANT
SET HNDPC FAN SPRY TIP SCT (DISPOSABLE) ×1 IMPLANT
SET PAD KNEE POSITIONER (MISCELLANEOUS) ×3 IMPLANT
SLEEVE TIB ATTUNE FP 45 (Knees) ×2 IMPLANT
SPONGE GAUZE 2X2 STER 10/PKG (GAUZE/BANDAGES/DRESSINGS)
STAPLER VISISTAT 35W (STAPLE) IMPLANT
STEM ATTUNE CEMENTED 16X80 IMPLANT
STEM STR ATTUNE PF 14X60 (Knees) ×2 IMPLANT
SUT MNCRL AB 3-0 PS2 18 (SUTURE) ×5 IMPLANT
SUT STRATAFIX PDS+ 0 24IN (SUTURE) ×3 IMPLANT
SUT VIC AB 1 CT1 36 (SUTURE) ×5 IMPLANT
SUT VIC AB 2-0 CT1 27 (SUTURE) ×12
SUT VIC AB 2-0 CT1 TAPERPNT 27 (SUTURE) ×3 IMPLANT
SYR 3ML LL SCALE MARK (SYRINGE) ×2 IMPLANT
SYR 50ML LL SCALE MARK (SYRINGE) IMPLANT
TOWER CARTRIDGE SMART MIX (DISPOSABLE) ×3 IMPLANT
TRAY FOLEY MTR SLVR 14FR STAT (SET/KITS/TRAYS/PACK) ×2 IMPLANT
TRAY FOLEY MTR SLVR 16FR STAT (SET/KITS/TRAYS/PACK) ×1 IMPLANT
TUBE KAMVAC SUCTION (TUBING) IMPLANT
TUBE SUCTION HIGH CAP CLEAR NV (SUCTIONS) ×3 IMPLANT
WATER STERILE IRR 1000ML POUR (IV SOLUTION) ×5 IMPLANT
WRAP KNEE MAXI GEL POST OP (GAUZE/BANDAGES/DRESSINGS) ×3 IMPLANT

## 2020-12-12 NOTE — Anesthesia Procedure Notes (Signed)
Spinal  Patient location during procedure: OR End time: 12/12/2020 7:45 AM Reason for block: surgical anesthesia Staffing Performed: anesthesiologist  Anesthesiologist: Annye Asa, MD Resident/CRNA: [place] Stephanie C, CRNA Preanesthetic Checklist Completed: patient identified, IV checked, site marked, risks and benefits discussed, surgical consent, monitors and equipment checked, pre-op evaluation and timeout performed Spinal Block Patient position: sitting Prep: DuraPrep and site prepped and draped Patient monitoring: blood pressure, continuous pulse ox, cardiac monitor and heart rate Approach: midline Location: L2-3 Injection technique: single-shot Needle Needle type: Quincke  Needle gauge: 22 G Needle length: 12.7 cm Assessment Events: CSF return and second provider Additional Notes Pt identified in Operating room.  Monitors applied. Working IV access confirmed. Sterile prep, drape lumbar spine.  1% lido local L 3,4.  #24ga Pencan unable to reach CSF, repeat local L2,3 Glennon Mac, MD), unable to reach CSF, them second attempt #22ga long Quinkce into CSF with 4m 0.75% Bupivacaine with dextrose injected with asp CSF beginning and end of injection.  Patient asymptomatic, VSS, no heme aspirated, tolerated well.  CJenita Seashore MD

## 2020-12-12 NOTE — Brief Op Note (Signed)
12/12/2020  9:47 AM  PATIENT:  Emmalise Porter-Jones  70 y.o. female  PRE-OPERATIVE DIAGNOSIS:  Failed Right total knee arthroplasty  POST-OPERATIVE DIAGNOSIS:  Failed Right total knee arthroplasty  PROCEDURE:  Procedure(s): TOTAL KNEE REVISION (Right)  SURGEON:  Surgeon(s) and Role:    Paralee Cancel, MD - Primary  PHYSICIAN ASSISTANT: Costella Hatcher, PA-C  ANESTHESIA:   regional and spinal  EBL:  250 mL   BLOOD ADMINISTERED:none  DRAINS: none   LOCAL MEDICATIONS USED:  MARCAINE     SPECIMEN:  No Specimen  DISPOSITION OF SPECIMEN:  N/A  COUNTS:  YES  TOURNIQUET:  66 min at 250 mmHg  DICTATION: .Other Dictation: Dictation Number [id number]  PLAN OF CARE: Admit to inpatient   PATIENT DISPOSITION:  PACU - hemodynamically stable.   Delay start of Pharmacological VTE agent (>24hrs) due to surgical blood loss or risk of bleeding: no

## 2020-12-12 NOTE — H&P (Signed)
TOTAL KNEE REVISION ADMISSION H&P  Patient is being admitted for right revision total knee arthroplasty.  Subjective:  Chief Complaint:right knee pain.  HPI: Helen Hardy, 70 y.o. female, has a history of pain and functional disability in the right knee(s) due to arthritis and patient has failed non-surgical conservative treatments for greater than 12 weeks to include NSAID's and/or analgesics, use of assistive devices, weight reduction as appropriate, and activity modification. The indications for the revision of the total knee arthroplasty are loosening of one or more components and bearing surface wear leading to implant or knee misalignment. Onset of symptoms was gradual starting 2 years ago with gradually worsening course since that time.  Prior procedures on the right knee(s) include arthroplasty.  Patient currently rates pain in the right knee(s) at 9 out of 10 with activity. There is worsening of pain with activity and weight bearing, pain that interferes with activities of daily living, and pain with passive range of motion.  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 This condition presents safety issues increasing the risk of falls. There is no current active infection.  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   Cecal diverticulitis 12/25/2019   Rheumatoid arthritis ([place]) 12/25/2019   GERD (gastroesophageal reflux disease) 12/25/2019   Essential hypertension 12/25/2019   AKI (acute kidney injury) ([place]) 12/25/2019   Hyperglycemia 12/25/2019   Chronic radicular lumbar pain 12/25/2019   Generalized weakness 12/25/2019   Anemia of chronic renal failure, stage 3 (moderate) ([place]) 12/30/2018   Lumbar disc herniation 03/04/2016   Iron deficiency anemia 06/21/2015   Malabsorption of iron 06/21/2015   Morbid obesity ([place])  04/10/2015   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   S/P knee replacement 04/08/2015   Past Medical History:  Diagnosis Date   Anemia    Anemia of chronic renal failure, stage 3 (moderate) ([place]) 12/30/2018   pt denies   GERD (gastroesophageal reflux disease)    History of ischemic colitis    Hospitalized   Hypertension    Malabsorption of iron 06/21/2015   Non Hodgkin's lymphoma ([facility])    Numbness and tingling of right leg    Pneumonia    x2   RA (rheumatoid arthritis) ([place])     Past Surgical History:  Procedure Laterality Date   2012 Right big toe and toe beside - had pins in toes     ABDOMINAL HYSTERECTOMY     BIOPSY  04/01/2020   Procedure: BIOPSY;  Surgeon: Juanita Craver, MD;  Location: [facility];  Service: Gastroenterology;;   bladder tack - 2005     COLONOSCOPY WITH PROPOFOL N/A 04/01/2020   Procedure: COLONOSCOPY WITH PROPOFOL;  Surgeon: Juanita Craver, MD;  Location: [facility];  Service: Gastroenterology;  Laterality: N/A;   EYE SURGERY Bilateral    cataract   JOINT REPLACEMENT Bilateral [place] MICRODISCECTOMY N/A 03/04/2016   Procedure: RIGHT FAR LATERAL DISCECTOMY L5-S1;  Surgeon: Melina Schools, MD;  Location: [facility];  Service: Orthopedics;  Laterality: N/A;   PORTACATH PLACEMENT     PORTACATH REMOVAL     ROTATOR CUFF REPAIR Left    TOTAL KNEE REVISION Left 04/08/2015   Procedure: REVISION LEFT TOTAL KNEE ;  Surgeon: Paralee Cancel, MD;  Location: [facility];  Service: Orthopedics;  Laterality: Left;    Current Facility-Administered Medications  Medication Dose Route Frequency Provider Last Rate Last Admin   ceFAZolin (ANCEF) IVPB 2g/100 mL premix  2 g Intravenous On Call to OR Irving Copas, PA-C       dexamethasone (DECADRON) injection 8 mg  8 mg Intravenous Once Irving Copas, PA-C       lactated ringers infusion   Intravenous Continuous Myrtie Soman, MD 10 mL/hr at 12/12/20 0636 Continued from Pre-op at 12/12/20 0636    tranexamic acid (CYKLOKAPRON) IVPB 1,000 mg  1,000 mg Intravenous To OR Irving Copas, PA-C       Allergies  Allergen Reactions   Oxycodone Nausea And Vomiting    Able tolerate with antinausea medicine   Tramadol Other (See Comments)    UNKNOWN REACTION    Social History   Tobacco Use   Smoking status: Never   Smokeless tobacco: Never  Substance Use Topics   Alcohol use: No    Alcohol/week: 0.0 standard drinks    History reviewed. No pertinent family history.    Review of Systems  Constitutional:  Negative for chills and fever.  Respiratory:  Negative for cough and shortness of breath.   Cardiovascular:  Negative for chest pain.  Gastrointestinal:  Negative for nausea and vomiting.  Musculoskeletal:  Positive for arthralgias.    Objective:  Physical Exam Well nourished and well developed. General: Alert and oriented x3, cooperative and pleasant, no acute distress. Head: normocephalic, atraumatic, neck supple. Eyes: EOMI.  Musculoskeletal: Right Knee: Well healed arthroplasty scar. No surrounding erythema, warmth, or effusion. Hyperextension of the knee with flexion to over 100 degrees. No effusion noted. Mild AP and lateral laxity.  Calves soft and nontender. Motor function intact in LE. Strength 5/5 LE bilaterally. Neuro: Distal pulses 2+. Sensation to light touch intact in LE.  Vital signs in last 24 hours: Temp:  [98 F (36.7 C)] 98 F (36.7 C) (06/09 0555) Pulse Rate:  [74] 74 (06/09 0555) Resp:  [18] 18 (06/09 0555) BP: (188)/(98) 188/98 (06/09 0555) SpO2:  [100 %] 100 % (06/09 0555) Weight:  [98 kg-98.2 kg] 98.2 kg (06/09 0555)  Labs:  Estimated body mass index is 43.71 kg/m as calculated from the following:   Height as of this encounter: 4' 11"  (1.499 m).   Weight as of this encounter: 98.2 kg.  Imaging Review Standing AP and lateral of the right knee were reviewed from her March 2022 office visit. These reveal previously performed  [employer] knee with polyethylene wear. On her lateral radiograph her remaining patella bone stock appears to be abnormal as we have noted in the past. It appears to be fragmented and elongated with questionable prior insufficiency fracture with nonunion versus malunion. Polyethylene component location and stability uncertain.   Assessment/Plan:  End stage arthritis, right knee(s) with failed previous arthroplasty.   The patient history, physical examination, clinical judgment of the provider and imaging studies are consistent with end stage degenerative joint disease of the right knee(s), previous total knee arthroplasty. Revision total knee arthroplasty is deemed medically necessary. The treatment options including medical management, injection therapy, arthroscopy and revision arthroplasty were discussed at length. The risks and benefits of revision total knee arthroplasty were presented and reviewed. The risks due to aseptic loosening, infection, stiffness, patella tracking problems, thromboembolic complications and other imponderables were discussed. The patient acknowledged the explanation, agreed to proceed with the plan and consent was signed. Patient is being admitted for inpatient treatment for surgery, pain control, PT, OT, prophylactic antibiotics, VTE prophylaxis, progressive ambulation  and ADL's and discharge planning.The patient is planning to be discharged home with home health services  Therapy Plans: outpatient therapy [facility] Disposition: Home with husband Planned DVT Prophylaxis: aspirin 42m BID DME needed: walker PCP: WDeland Pretty clearance received Rheumatologist: Dr. TSabino Niemann- has seen, awaiting forms Oncologist: Dr. EMarin Olp- has seen, awaiting forms TXA: IV Allergies: Oxycodone - nausea, Tramadol - unsure Anesthesia Concerns: none BMI: 43.2 Last HgbA1c:   Other: - Oxycodone & Zofran --- if not, Dilaudid - ??? Put a PICC line in before - Prednisone  10 mg daily    AGriffith Citron PA-C Orthopedic Surgery [facility] [place] ([phone]

## 2020-12-12 NOTE — Interval H&P Note (Signed)
History and Physical Interval Note:  12/12/2020 7:06 AM  Helen Hardy  has presented today for surgery, with the diagnosis of Failed Right total knee arthroplasty.  The various methods of treatment have been discussed with the patient and family. After consideration of risks, benefits and other options for treatment, the patient has consented to  Procedure(s): TOTAL KNEE REVISION (Right) as a surgical intervention.  The patient's history has been reviewed, patient examined, no change in status, stable for surgery.  I have reviewed the patient's chart and labs.  Questions were answered to the patient's satisfaction.     [name]

## 2020-12-12 NOTE — Op Note (Signed)
NAME: Helen Hardy, BOLLS MEDICAL RECORD NO: [id number] ACCOUNT NO: [id number] DATE OF BIRTH: 11-10-1950 FACILITY: [facility] LOCATION: [id number] PHYSICIAN: Pietro Cassis. 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  FINDINGS:  Please see operative note for findings during the procedure.  PROCEDURE:  Revision right total knee arthroplasty.  COMPONENTS USED:  DePuy Attune revision knee system with a size 3 femoral component with 8 mm distal lateral augment and a 4 mm distal medial augment, utilizing a size 16 x 80 cemented stem.  On the tibial side, we used a size 3 Attune revision tibial  tray with a 45 mm porous coated sleeve and a press-fit 14 x 60 stem.  We used a size 14 mm poly to match the size 3 femur.  Note that there was no patellar revision due to bone stock quality of the remaining patella.  SURGEON:  Paralee Cancel, MD  ASSISTANT:  Costella Hatcher, PA-C.  Note that Ms. Lu Duffel was present for the entirety of the case from preoperative positioning, perioperative management of the operative extremity, general facilitation of the case and primary wound closure.  ANESTHESIA:  General plus regional.  SPECIMENS:  None.  COMPLICATIONS:  None.  BLOOD LOSS:  Less than 100 mL.  TOURNIQUET:  Up for 66 minutes at 250 mmHg.  INDICATIONS:  The patient is a 70 year old female who I have followed over the last handful of years for her knees.  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  We have already revised her left knee due to aseptic  failure.  She has been trying to have her right knee replaced.  We have been working on weight status, but her mobility has been severely limited and she has been unable to lose significant weight.  She wished at this point to  proceed with revision of  her right knee due to pain and dysfunction.  Risk of infection reviewed as it relates to modifiable risk factors, risks of DVT, component failure, need for future surgeries as well as any issues surrounding intraoperative findings.  Consent was obtained  for benefit of pain relief.  The postoperative course was reviewed and discussed and stressed.  DESCRIPTION OF PROCEDURE:  The patient was brought to the operative theater.  Once adequate anesthesia, preoperative antibiotics, Ancef administered as well as tranexamic acid and Decadron, she was positioned supine with a right thigh tourniquet placed.   The right lower extremity was prepped and draped in sterile fashion.  Timeout was performed, identifying the patient, planned procedure, and extremity.  Her old incision was marked on the skin.  The tourniquet was elevated to 250 mmHg and the leg was  exsanguinated and tourniquet elevated.  Her old incision was excised.  Soft tissue planes created.  She was noted to have very weak soft tissues including extensor mechanism and retinaculum, likely related to deconditioning as well as the condition of  the adipose tissue around this.  A median arthrotomy was then made, preserving all aspects of the extensor mechanism and retinaculum.  We encountered clear synovial fluid.  Initial portion of the procedure was carried out at synovectomy and scar  debridement.  I debrided and cleared out the medial and lateral gutters as well as suprapatellar pouch.  I then focused on removing the primary components.  A thin ACL  saw was used to undermine the cement bone interface of the tibial component.  The  tibia was subluxated anteriorly and the tibial component removed without bone loss.  Remaining cement was removed within the canal as well as proximal tibia.  The distal femur was then removed in a similar fashion using the thin ACL saw underneath the  cement interface.  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  Following initial debridement, I then took the time to evaluate her patella.  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  Her patella was also in multiple pieces.  I debrided things back to stable levels, but there was no bone stock to restore patellar button.  I  did not perform a patellectomy as I wanted her to have some bone stock in place to help generate power.  Thus, the patella was protected throughout the case as much as possible to prevent further injury.  At this point, we opened up the canals of the femur and the tibia.  On the femoral side, we went up to 16 and on the tibia side, we went to about 14, with  the 15 mm done about half way for the press-fit option that was going to be utilized.  I then sized the tibia to be best fit with a size 3 tibial tray.  I then broached up to the 45, which sat proud of her joint line currently.  This was acceptable to  me due to the fact that she had significant hyperlaxity in extension and in flexion preoperatively.  A trial component was then configured with a 3 tibial tray with the 45 sleeve and the 14 x 60 press-fit stem trial.  Once this was done, we attended to  the femur.  The femur was prepared per protocol for a size 3 component.  Based on the initial evaluation of distal femur, I did use a 4 mm augment laterally.  After initial trial reduction, I changed this to an 8 mm augment and a 4 mm distal medial  augment.  When we did our trial reductions, I did identify that the knee came to now full extension without significant hyperextension with a 14 mm insert.  I elected not to go up further sizes on the sleeve on the tibia side related to remaining bone  stock on the tibia.  Given all the bone preparation and debridements followed by the trial reductions,  we then opened up all the final components.  The final components were configured on the back table under direct supervision.  The canals of the femur  and tibia were irrigated with a pulse lavage canal brush irrigator.  I then placed a cement restrictor in the distal femur measuring size 5.  The knee was then irrigated with pulse lavage.  The press-fit tibial component was configured and then impacted  into the proximal tibia without complication.  The femoral side was cemented into place.  The knee was then brought to extension with a size 14 insert and allowed the cement to cure.  Excessive cement was removed.  Once the cement fully cured, we  reirrigated the knee and placed the final 14 mm insert to match the size 3 tibia.  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  We then reapproximated the extensor mechanism using  a combination of #1  Vicryl and #1 Stratafix suture.  The remainder of the wound was closed with 2-0 Vicryl and running Monocryl stitch.  The knee was cleaned, dried and dressed sterilely using surgical glue and Aquacel dressing.  We will have her be partial weightbearing  based on the press-fit component, but we will also have her limited work on flexion without unnecessary stress and strain related to the poor quality of her extensor mechanism not related to anything that went on intraoperatively, just concerned about  the extensor mechanism itself.  I do not want her to be excessively push or have a complication where she hyperflexes her knee, as this could be detrimental to her recovery.  POSTOPERATIVE PLAN:  She will be in the hospital for 1-2 days.  We will see her back in the office in 2 weeks to evaluate her wound and check her progress.   SHW D: 12/12/2020 11:34:34 am T: 12/12/2020 10:43:00 pm  JOB: [id number]/ [id number]

## 2020-12-12 NOTE — Anesthesia Procedure Notes (Signed)
Anesthesia Regional Block: Adductor canal block   Pre-Anesthetic Checklist: , timeout performed,  Correct Patient, Correct Site, Correct Laterality,  Correct Procedure, Correct Position, site marked,  Risks and benefits discussed,  Surgical consent,  Pre-op evaluation,  At surgeon's request and post-op pain management  Laterality: Right and Lower  Prep: chloraprep       Needles:  Injection technique: Single-shot  Needle Type: Echogenic Needle     Needle Length: 9cm  Needle Gauge: 21     Additional Needles:   Procedures:,,,, ultrasound used (permanent image in chart),,    Narrative:  Start time: 12/12/2020 7:01 AM End time: 12/12/2020 7:08 AM Injection made incrementally with aspirations every 5 mL.  Performed by: Personally  Anesthesiologist: Annye Asa, MD  Additional Notes: Pt identified in Holding room.  Monitors applied. Working IV access confirmed. Sterile prep R thigh.  #21ga ECHOgenic ARROW block needle into adductor canal with US guidance.  20cc 0.75% Ropivacaine with injected incrementally after negative test dose.  Patient asymptomatic, VSS, no heme aspirated, tolerated well.   Jenita Seashore, MD

## 2020-12-12 NOTE — Anesthesia Postprocedure Evaluation (Signed)
Anesthesia Post Note  Patient: Helen Hardy  Procedure(s) Performed: TOTAL KNEE REVISION (Right: Knee)     Patient location during evaluation: PACU Anesthesia Type: Spinal Level of consciousness: awake and alert, patient cooperative and oriented Pain management: pain level controlled Vital Signs Assessment: post-procedure vital signs reviewed and stable Respiratory status: spontaneous breathing, nonlabored ventilation, respiratory function stable and patient connected to nasal cannula oxygen Cardiovascular status: blood pressure returned to baseline and stable Postop Assessment: patient able to bend at knees, no apparent nausea or vomiting and spinal receding Anesthetic complications: no   No notable events documented.  Last Vitals:  Vitals:   12/12/20 1145 12/12/20 1211  BP: (!) 141/111 (!) 159/87  Pulse: 64 64  Resp: 12 16  Temp: 36.4 C 36.5 C  SpO2: 98% 100%    Last Pain:  Vitals:   12/12/20 1211  TempSrc: Oral  PainSc:                  Hamdan Toscano,E. Lavar Rosenzweig

## 2020-12-12 NOTE — Discharge Instructions (Addendum)
INSTRUCTIONS AFTER JOINT REPLACEMENT   Remove items at home which could result in a fall. This includes throw rugs or furniture in walking pathways ICE to the affected joint every three hours while awake for 30 minutes at a time, for at least the first 3-5 days, and then as needed for pain and swelling.  Continue to use ice for pain and swelling. You may notice swelling that will progress down to the foot and ankle.  This is normal after surgery.  Elevate your leg when you are not up walking on it.   Continue to use the breathing machine you got in the hospital (incentive spirometer) which will help keep your temperature down.  It is common for your temperature to cycle up and down following surgery, especially at night when you are not up moving around and exerting yourself.  The breathing machine keeps your lungs expanded and your temperature down.   DIET:  As you were doing prior to hospitalization, we recommend a well-balanced diet.  DRESSING / WOUND CARE / SHOWERING  Keep the surgical dressing until follow up.  The dressing is water proof, so you can shower without any extra covering.  IF THE DRESSING FALLS OFF or the wound gets wet inside, change the dressing with sterile gauze.  Please use good hand washing techniques before changing the dressing.  Do not use any lotions or creams on the incision until instructed by your surgeon.    ACTIVITY  Increase activity slowly as tolerated, but follow the weight bearing instructions below.   No driving for 6 weeks or until further direction given by your physician.  You cannot drive while taking narcotics.  No lifting or carrying greater than 10 lbs. until further directed by your surgeon. Avoid periods of inactivity such as sitting longer than an hour when not asleep. This helps prevent blood clots.  You may return to work once you are authorized by your doctor.     WEIGHT BEARING   Partial weight bearing with assist device as directed.   .      CONSTIPATION  Constipation is defined medically as fewer than three stools per week and severe constipation as less than one stool per week.  Even if you have a regular bowel pattern at home, your normal regimen is likely to be disrupted due to multiple reasons following surgery.  Combination of anesthesia, postoperative narcotics, change in appetite and fluid intake all can affect your bowels.   YOU MUST use at least one of the following options; they are listed in order of increasing strength to get the job done.  They are all available over the counter, and you may need to use some, POSSIBLY even all of these options:    Drink plenty of fluids (prune juice may be helpful) and high fiber foods Colace 100 mg by mouth twice a day  Senokot for constipation as directed and as needed Dulcolax (bisacodyl), take with full glass of water  Miralax (polyethylene glycol) once or twice a day as needed.  If you have tried all these things and are unable to have a bowel movement in the first 3-4 days after surgery call either your surgeon or your primary doctor.    If you experience loose stools or diarrhea, hold the medications until you stool forms back up.  If your symptoms do not get better within 1 week or if they get worse, check with your doctor.  If you experience "the worst abdominal pain ever" or  develop nausea or vomiting, please contact the office immediately for further recommendations for treatment.   ITCHING:  If you experience itching with your medications, try taking only a single pain pill, or even half a pain pill at a time.  You can also use Benadryl over the counter for itching or also to help with sleep.   TED HOSE STOCKINGS:  Use stockings on both legs until for at least 2 weeks or as directed by physician office. They may be removed at night for sleeping.  MEDICATIONS:  See your medication summary on the "After Visit Summary" that nursing will review with you.  You may  have some home medications which will be placed on hold until you complete the course of blood thinner medication.  It is important for you to complete the blood thinner medication as prescribed.  PRECAUTIONS:  If you experience chest pain or shortness of breath - call 911 immediately for transfer to the hospital emergency department.   If you develop a fever greater that 101 F, purulent drainage from wound, increased redness or drainage from wound, foul odor from the wound/dressing, or calf pain - CONTACT YOUR SURGEON.                                                   FOLLOW-UP APPOINTMENTS:  If you do not already have a post-op appointment, please call the office for an appointment to be seen by your surgeon.  Guidelines for how soon to be seen are listed in your "After Visit Summary", but are typically between 1-4 weeks after surgery.  POST-OPERATIVE OPIOID TAPER INSTRUCTIONS: It is important to wean off of your opioid medication as soon as possible. If you do not need pain medication after your surgery it is ok to stop day one. Opioids include: Codeine, Hydrocodone(Norco, Vicodin), Oxycodone(Percocet, oxycontin) and hydromorphone amongst others.  Long term and even short term use of opiods can cause: Increased pain response Dependence Constipation Depression Respiratory depression And more.  Withdrawal symptoms can include Flu like symptoms Nausea, vomiting And more Techniques to manage these symptoms Hydrate well Eat regular healthy meals Stay active Use relaxation techniques(deep breathing, meditating, yoga) Do Not substitute Alcohol to help with tapering If you have been on opioids for less than two weeks and do not have pain than it is ok to stop all together.  Plan to wean off of opioids This plan should start within one week post op of your joint replacement. Maintain the same interval or time between taking each dose and first decrease the dose.  Cut the total daily intake  of opioids by one tablet each day Next start to increase the time between doses. The last dose that should be eliminated is the evening dose.   MAKE SURE YOU:  Understand these instructions.  Get help right away if you are not doing well or get worse.    Thank you for letting us be a part of your medical care team.  It is a privilege we respect greatly.  We hope these instructions will help you stay on track for a fast and full recovery!

## 2020-12-12 NOTE — Transfer of Care (Signed)
Immediate Anesthesia Transfer of Care Note  Patient: Helen Hardy  Procedure(s) Performed: TOTAL KNEE REVISION (Right: Knee)  Patient Location: PACU  Anesthesia Type:Spinal  Level of Consciousness: awake, alert  and oriented  Airway & Oxygen Therapy: Patient Spontanous Breathing and Patient connected to face mask oxygen  Post-op Assessment: Report given to RN and Post -op Vital signs reviewed and stable  Post vital signs: Reviewed and stable  Last Vitals:  Vitals Value Taken Time  BP 133/75 12/12/20 1030  Temp    Pulse 73 12/12/20 1030  Resp 12 12/12/20 1030  SpO2 99 % 12/12/20 1030  Vitals shown include unvalidated device data.  Last Pain:  Vitals:   12/12/20 0555  TempSrc: Oral  PainSc:       Patients Stated Pain Goal: 5 (96/11/64 3539)  Complications: No notable events documented.

## 2020-12-12 NOTE — Evaluation (Signed)
Physical Therapy Evaluation Patient Details Name: Helen Hardy MRN: [id number] DOB: 11/25/1950 Today's Date: 12/12/2020   History of Present Illness  Pt s/p R TKR revision and with hx of bil TKR, RA, non-hodgkins lymphoma and back surgery  Clinical Impression  Pt s/p R TKR revision and presents with functional mobility limitations 2* decreased R LE strength/ROM, post op pain, premorbid deconditioning, PWB and obesity.  Pt hopes to progress to dc home with family assist.    Follow Up Recommendations Home health PT    Equipment Recommendations  Rolling walker with 5" wheels (pt is 4'11 and 200lbs)    Recommendations for Other Services       Precautions / Restrictions Precautions Precautions: Fall;Knee Restrictions Weight Bearing Restrictions: Yes RLE Weight Bearing: Partial weight bearing      Mobility  Bed Mobility Overal bed mobility: Needs Assistance Bed Mobility: Supine to Sit     Supine to sit: Min assist;Mod assist;+2 for physical assistance;+2 for safety/equipment     General bed mobility comments: Increased time with assist to manage R LE and to bring trunk to upright    Transfers Overall transfer level: Needs assistance Equipment used: Rolling walker (2 wheeled) Transfers: Sit to/from [facility] Sit to Stand: Min assist;Mod assist;+2 physical assistance;+2 safety/equipment;From elevated surface Stand pivot transfers: Min assist;Mod assist;+2 physical assistance;+2 safety/equipment;From elevated surface       General transfer comment: cues for LE management and use of UEs to self assist.  physical assist to bring wt up and fwd and to balance in standing  Ambulation/Gait Ambulation/Gait assistance: Min assist;Mod assist;+2 physical assistance;+2 safety/equipment Gait Distance (Feet): 2 Feet Assistive device: Rolling walker (2 wheeled) Gait Pattern/deviations: Step-to pattern;Decreased step length - right;Decreased step length -  left;Shuffle;Decreased stance time - right;Trunk flexed Gait velocity: decr   General Gait Details: cues for sequence, posture and position from [employer]            Wheelchair Mobility    Modified Rankin (Stroke Patients Only)       Balance Overall balance assessment: Needs assistance Sitting-balance support: No upper extremity supported;Feet supported Sitting balance-Leahy Scale: Good     Standing balance support: Bilateral upper extremity supported Standing balance-Leahy Scale: Poor                               Pertinent Vitals/Pain Pain Assessment: 0-10 Pain Score: 4  Pain Location: R knee Pain Descriptors / Indicators: Aching;Sore Pain Intervention(s): Limited activity within patient's tolerance;Monitored during session;Premedicated before session;Ice applied    Home Living Family/patient expects to be discharged to:: Private residence Living Arrangements: Spouse/significant other Available Help at Discharge: Family;Available 24 hours/day Type of Home: House Home Access: Stairs to enter Entrance Stairs-Rails: None Entrance Stairs-Number of Steps: 1 Home Layout: One level Home Equipment: Cane - single point;Shower seat;Bedside commode Additional Comments: denies falls in past 1 year    Prior Function Level of Independence: Independent with assistive device(s)         Comments: walks with RW or SPC at baseline, sits on shower seat to bathe     Hand Dominance        Extremity/Trunk Assessment   Upper Extremity Assessment Upper Extremity Assessment: Generalized weakness    Lower Extremity Assessment Lower Extremity Assessment: Generalized weakness;RLE deficits/detail       Communication   Communication: No difficulties  Cognition Arousal/Alertness: Awake/alert Behavior During Therapy: WFL for tasks assessed/performed Overall Cognitive Status:  Within Functional Limits for tasks assessed                                         General Comments      Exercises Total Joint Exercises Ankle Circles/Pumps: AROM;Both;15 reps;Supine   Assessment/Plan    PT Assessment Patient needs continued PT services  PT Problem List Decreased strength;Decreased range of motion;Decreased activity tolerance;Decreased balance;Decreased mobility;Decreased knowledge of use of DME;Obesity;Pain       PT Treatment Interventions DME instruction;Gait training;Stair training;Functional mobility training;Therapeutic activities;Therapeutic exercise;Patient/family education;Balance training    PT Goals (Current goals can be found in the Care Plan section)  Acute Rehab PT Goals Patient Stated Goal: Regain IND and not have to go to SNF PT Goal Formulation: With patient Time For Goal Achievement: 12/26/20 Potential to Achieve Goals: Good    Frequency 7X/week   Barriers to discharge        Co-evaluation               AM-PAC PT "6 Clicks" Mobility  Outcome Measure Help needed turning from your back to your side while in a flat bed without using bedrails?: A Lot Help needed moving from lying on your back to sitting on the side of a flat bed without using bedrails?: A Lot Help needed moving to and from a bed to a chair (including a wheelchair)?: A Lot Help needed standing up from a chair using your arms (e.g., wheelchair or bedside chair)?: A Lot Help needed to walk in hospital room?: A Lot Help needed climbing 3-5 steps with a railing? : Total 6 Click Score: 11    End of Session Equipment Utilized During Treatment: Gait belt Activity Tolerance: Patient tolerated treatment well Patient left: in chair;with call bell/phone within reach;with chair alarm set Nurse Communication: Mobility status PT Visit Diagnosis: Difficulty in walking, not elsewhere classified (R26.2);Muscle weakness (generalized) (M62.81);Pain Pain - Right/Left: Right Pain - part of body: Knee    Time: 6720-9470 PT Time Calculation (min)  (ACUTE ONLY): 32 min   Charges:   PT Evaluation $PT Eval Low Complexity: 1 Low PT Treatments $Therapeutic Activity: 8-22 mins        Debe Coder PT Acute Rehabilitation Services Pager [phone] Office [phone]   Jaevian Shean 12/12/2020, 4:26 PM

## 2020-12-13 ENCOUNTER — Encounter (HOSPITAL_COMMUNITY): Payer: Self-pay | Admitting: Orthopedic Surgery

## 2020-12-13 LAB — CBC
HCT: 27.8 % — ABNORMAL LOW (ref 36.0–46.0)
Hemoglobin: 8.7 g/dL — ABNORMAL LOW (ref 12.0–15.0)
MCH: 36 pg — ABNORMAL HIGH (ref 26.0–34.0)
MCHC: 31.3 g/dL (ref 30.0–36.0)
MCV: 114.9 fL — ABNORMAL HIGH (ref 80.0–100.0)
Platelets: 126 10*3/uL — ABNORMAL LOW (ref 150–400)
RBC: 2.42 MIL/uL — ABNORMAL LOW (ref 3.87–5.11)
RDW: 16.4 % — ABNORMAL HIGH (ref 11.5–15.5)
WBC: 7 10*3/uL (ref 4.0–10.5)
nRBC: 0 % (ref 0.0–0.2)

## 2020-12-13 LAB — BASIC METABOLIC PANEL
Anion gap: 8 (ref 5–15)
BUN: 26 mg/dL — ABNORMAL HIGH (ref 8–23)
CO2: 25 mmol/L (ref 22–32)
Calcium: 8.5 mg/dL — ABNORMAL LOW (ref 8.9–10.3)
Chloride: 106 mmol/L (ref 98–111)
Creatinine, Ser: 1.25 mg/dL — ABNORMAL HIGH (ref 0.44–1.00)
GFR, Estimated: 46 mL/min — ABNORMAL LOW (ref >60)
Glucose, Bld: 120 mg/dL — ABNORMAL HIGH (ref 70–99)
Potassium: 4.7 mmol/L (ref 3.5–5.1)
Sodium: 139 mmol/L (ref 135–145)

## 2020-12-13 MED ORDER — MUPIROCIN 2 % EX OINT
1.0000 "application " | TOPICAL_OINTMENT | Freq: Two times a day (BID) | CUTANEOUS | Status: DC
  Administered 2020-12-13 – 2020-12-17 (×9): 1 via NASAL
  Filled 2020-12-13: qty 22

## 2020-12-13 MED ORDER — OXYCODONE HCL 5 MG PO TABS
10.0000 mg | ORAL_TABLET | ORAL | Status: DC | PRN
  Administered 2020-12-13 (×3): 10 mg via ORAL
  Administered 2020-12-14 (×2): 15 mg via ORAL
  Administered 2020-12-15: 10 mg via ORAL
  Administered 2020-12-15: 15 mg via ORAL
  Administered 2020-12-15 – 2020-12-16 (×5): 10 mg via ORAL
  Administered 2020-12-17 (×3): 15 mg via ORAL
  Filled 2020-12-13 (×2): qty 3
  Filled 2020-12-13: qty 2
  Filled 2020-12-13 (×2): qty 3
  Filled 2020-12-13 (×2): qty 2
  Filled 2020-12-13 (×2): qty 3

## 2020-12-13 MED ORDER — ACETAMINOPHEN 500 MG PO TABS
1000.0000 mg | ORAL_TABLET | Freq: Three times a day (TID) | ORAL | Status: DC
  Administered 2020-12-13 – 2020-12-17 (×14): 1000 mg via ORAL
  Filled 2020-12-13 (×15): qty 2

## 2020-12-13 MED ORDER — CHLORHEXIDINE GLUCONATE CLOTH 2 % EX PADS
6.0000 | MEDICATED_PAD | Freq: Every day | CUTANEOUS | Status: DC
  Administered 2020-12-13 – 2020-12-17 (×2): 6 via TOPICAL

## 2020-12-13 NOTE — TOC Initial Note (Signed)
Transition of Care [facility]) - Initial/Assessment Note   Patient Details  Name: Helen Hardy MRN: [id number] Date of Birth: October 19, 1950  Transition of Care [facility]) CM/SW Contact:    Sherie Don, LCSW Phone Number: 12/13/2020, 1:46 PM  Clinical Narrative: Patient is a 70 year old female who was admitted for revision of right total knee. CSW spoke with patient to discuss discharge plan. Per patient, she is set up for OPPT through [facility] but expressed concerns about getting in/out of the car to go to OPPT and requested HHPT. CSW recommended patient ask the orthopedist or PA about HHPT next time she is seen. [employer] to switch out rolling walker with a youth rolling walker to accommodate patient's height. TOC to follow.  Expected Discharge Plan: OP Rehab Barriers to Discharge: Continued Medical Work up  Patient Goals and CMS Choice Patient states their goals for this hospitalization and ongoing recovery are:: Return home CMS Medicare.gov Compare Post Acute Care list provided to:: Patient Choice offered to / list presented to : Patient  Expected Discharge Plan and Services Expected Discharge Plan: OP Rehab In-house Referral: Clinical Social Work Post Acute Care Choice: Durable Medical Equipment Living arrangements for the past 2 months: [place]              DME Arranged: Environmental consultant youth DME Agency: Economist spoke with at DME Agency: Pre-arranged in orthopedist's office  Prior Living Arrangements/Services Living arrangements for the past 2 months: [place] Lives with:: Spouse Patient language and need for interpreter reviewed:: Yes Do you feel safe going back to the place where you live?: Yes      Need for Family Participation in Patient Care: No (Comment) Care giver support system in place?: Yes (comment) Current home services: DME (3N1) Criminal Activity/Legal Involvement Pertinent to Current Situation/Hospitalization: No - Comment as  needed  Activities of Daily Living Home Assistive Devices/Equipment: Bedside commode/3-in-1, Grab bars in shower, Cane (specify quad or straight) ADL Screening (condition at time of admission) Patient's cognitive ability adequate to safely complete daily activities?: Yes Is the patient deaf or have difficulty hearing?: No Does the patient have difficulty seeing, even when wearing glasses/contacts?: No Does the patient have difficulty concentrating, remembering, or making decisions?: No Patient able to express need for assistance with ADLs?: Yes Does the patient have difficulty dressing or bathing?: No Independently performs ADLs?: No Communication: Independent Dressing (OT): Needs assistance Grooming: Independent Feeding: Independent Bathing: Independent Toileting: Independent In/Out Bed: Independent Walks in Home: Independent Does the patient have difficulty walking or climbing stairs?: Yes Weakness of Legs: Both Weakness of Arms/Hands: Both  Emotional Assessment Appearance:: Appears stated age Attitude/Demeanor/Rapport: Engaged Affect (typically observed): Accepting Orientation: : Oriented to Self, Oriented to Place, Oriented to  Time, Oriented to Situation Alcohol / Substance Use: Not Applicable Psych Involvement: No (comment)  Admission diagnosis:  S/P revision of total knee, right [Z96.651] 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   GI bleed 03/25/2020   SIRS (systemic inflammatory response syndrome) (HCC) 03/25/2020   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[place]) 12/25/2019   Cecal diverticulitis 12/25/2019   Rheumatoid arthritis ([place]) 12/25/2019   GERD (gastroesophageal reflux disease) 12/25/2019   Essential hypertension 12/25/2019   AKI (acute kidney injury) ([place]) 12/25/2019   Hyperglycemia 12/25/2019   Chronic radicular lumbar pain 12/25/2019   Generalized weakness 12/25/2019   Anemia of chronic renal failure, stage 3  (moderate) ([place]) 12/30/2018   Lumbar disc herniation 03/04/2016  Iron deficiency anemia 06/21/2015   Malabsorption of iron 06/21/2015   Morbid obesity ([place]) 04/10/2015   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   S/P knee replacement 04/08/2015   PCP:  Deland Pretty, MD Pharmacy:   [facility] ([place]), [place] - [address] [place] ([place]) [place] [address] Phone: [phone] Fax: [phone]  Readmission Risk Interventions Readmission Risk Prevention Plan 12/13/2020  Transportation Screening Complete  Home Care Screening Complete  Medication Review (RN CM) Complete  Some recent data might be hidden

## 2020-12-13 NOTE — Progress Notes (Signed)
Subjective: 1 Day Post-Op Procedure(s) (LRB): TOTAL KNEE REVISION (Right) Patient reports pain as moderate to severe, tearful this am No specific events Foley out and worried about mobility to get to bathroom History of what sounds like ileus requiring hospitalization in past    Objective: Vital signs in last 24 hours: Temp:  [96.9 F (36.1 C)-98.7 F (37.1 C)] 98.7 F (37.1 C) (06/10 0450) Pulse Rate:  [64-84] 84 (06/10 0450) Resp:  [12-24] 16 (06/10 0450) BP: (133-177)/(70-111) 177/86 (06/10 0450) SpO2:  [98 %-100 %] 100 % (06/10 0450)  Intake/Output from previous day: 06/09 0701 - 06/10 0700 In: 2534.8 [P.O.:680; I.V.:1504.8; IV Piggyback:350] Out: 1600 [Urine:1350; Blood:250] Intake/Output this shift: No intake/output data recorded.  Recent Labs    12/12/20 0600 12/13/20 0314  HGB 10.9* 8.7*   Recent Labs    12/12/20 0600 12/13/20 0314  WBC 6.5 7.0  RBC 3.00* 2.42*  HCT 34.8* 27.8*  PLT 154 126*   Recent Labs    12/12/20 0600 12/13/20 0314  NA 144 139  K 4.3 4.7  CL 108 106  CO2 24 25  BUN 27* 26*  CREATININE 1.08* 1.25*  GLUCOSE 104* 120*  CALCIUM 9.3 8.5*   No results for input(s): LABPT, INR in the last 72 hours.  Neurovascular intact Incision: dressing C/D/I   Assessment/Plan: 1 Day Post-Op Procedure(s) (LRB): TOTAL KNEE REVISION (Right) Advance diet Up with therapy Bedside commode to aid in early mobility for bathroom access Prune juice to help prevent post op constipation Will review pain meds to help with post op pain No discharge likely today due to pain control   Anticipated LOS equal to or greater than 2 midnights due to - Age 72 and older with one or more of the following:  - Obesity  - Expected need for hospital services (PT, OT, Nursing) required for safe  discharge  - Anticipated need for postoperative skilled nursing care or inpatient rehab  - Active co-morbidities: Chronic pain requiring opiods OR   - Unanticipated  findings during/Post Surgery: Slow post-op progression: GI, pain control, mobility  - Patient is a high risk of re-admission due to: None   [name] 12/13/2020, 7:45 AM

## 2020-12-13 NOTE — Progress Notes (Signed)
Helen Hardy  MRSA came back positive, notified by lab.

## 2020-12-13 NOTE — Plan of Care (Signed)
  Problem: Activity: Goal: Ability to avoid complications of mobility impairment will improve Outcome: Progressing   Problem: Clinical Measurements: Goal: Postoperative complications will be avoided or minimized Outcome: Progressing   Problem: Pain Management: Goal: Pain level will decrease with appropriate interventions Outcome: Progressing   Problem: Skin Integrity: Goal: Will show signs of wound healing Outcome: Progressing

## 2020-12-13 NOTE — Progress Notes (Signed)
Physical Therapy Treatment Patient Details Name: Helen Hardy MRN: [id number] DOB: 1951/04/06 Today's Date: 12/13/2020    History of Present Illness Pt s/p R TKR revision and with hx of bil TKR, RA, non-hodgkins lymphoma and back surgery    PT Comments    POD # 1 pm session Pt has been up and down to [facility] several times today back in bed Max c/o fatigue.  Performed TKR TE's followed by ICE. Pt plans to D/C to home with spouse.   Follow Up Recommendations  Home health PT     Equipment Recommendations  Rolling walker with 5" wheels    Recommendations for Other Services       Precautions / Restrictions Precautions Precautions: Fall;Knee Restrictions Weight Bearing Restrictions: Yes RLE Weight Bearing: Partial weight bearing RLE Partial Weight Bearing Percentage or Pounds: 50%    Mobility  Ambulation/Gait         Wheelchair Mobility    Modified Rankin (Stroke Patients Only)       Balance                                            Cognition Arousal/Alertness: Awake/alert Behavior During Therapy: WFL for tasks assessed/performed Overall Cognitive Status: Within Functional Limits for tasks assessed                                 General Comments: AxO x 3 pleasant      Exercises  Total Knee Replacement TE's following HEP handout 10 reps B LE ankle pumps 05 reps towel squeezes 05 reps knee presses 05 reps heel slides  05 reps SAQ's 05 reps SLR's 05 reps ABD Educated on use of gait belt to assist with TE's Followed by ICE     General Comments        Pertinent Vitals/Pain Pain Assessment: 0-10 Pain Score: 8  Pain Location: R knee Pain Descriptors / Indicators: Aching;Sore Pain Intervention(s): Monitored during session;Repositioned;Premedicated before session;Ice applied    Home Living                      Prior Function            PT Goals (current goals can now be found in the care plan  section) Progress towards PT goals: Progressing toward goals    Frequency    7X/week      PT Plan Current plan remains appropriate    Co-evaluation              AM-PAC PT "6 Clicks" Mobility   Outcome Measure  Help needed turning from your back to your side while in a flat bed without using bedrails?: A Lot Help needed moving from lying on your back to sitting on the side of a flat bed without using bedrails?: A Lot Help needed moving to and from a bed to a chair (including a wheelchair)?: A Lot Help needed standing up from a chair using your arms (e.g., wheelchair or bedside chair)?: A Lot Help needed to walk in hospital room?: A Lot Help needed climbing 3-5 steps with a railing? : Total 6 Click Score: 11    End of Session Equipment Utilized During Treatment: Gait belt Activity Tolerance: Patient limited by fatigue;Patient limited by pain Patient left: in bed;with bed alarm set Nurse  Communication: Mobility status PT Visit Diagnosis: Difficulty in walking, not elsewhere classified (R26.2);Muscle weakness (generalized) (M62.81);Pain Pain - Right/Left: Right Pain - part of body: Knee     Time: 1440-1500 PT Time Calculation (min) (ACUTE ONLY): 20 min  Charges:  $Therapeutic Exercise: 8-22 mins                     {Ceaser Ebeling  PTA Acute  Rehabilitation Services Pager      [phone] Office      [phone]

## 2020-12-13 NOTE — Progress Notes (Signed)
Physical Therapy Treatment Patient Details Name: Helen Hardy MRN: [id number] DOB: [birth year] Today's Date: 12/13/2020    History of Present Illness Pt s/p R TKR revision and with hx of bil TKR, RA, non-hodgkins lymphoma and back surgery    PT Comments    POD # 1 am session Assisted OOB required much effort.  General bed mobility comments: Increased time with assist to manage R LE and to bring trunk to upright.  General transfer comment: assisted from elevated bed to [facility] with 50% VC's on proper hand placement and safety with turns.  General Gait Details: very difficult to advance and maintain 50% WBing due to RA B UE's (hand grip on walker).  Very limited amb distance. Assisted to recliner and applied ICE.   Follow Up Recommendations  Home health PT     Equipment Recommendations  Rolling walker with 5" wheels    Recommendations for Other Services       Precautions / Restrictions Precautions Precautions: Fall;Knee Restrictions Weight Bearing Restrictions: Yes RLE Weight Bearing: Partial weight bearing RLE Partial Weight Bearing Percentage or Pounds: 50%    Mobility  Bed Mobility Overal bed mobility: Needs Assistance Bed Mobility: Supine to Sit     Supine to sit: Mod assist     General bed mobility comments: Increased time with assist to manage R LE and to bring trunk to upright    Transfers Overall transfer level: Needs assistance Equipment used: Rolling walker (2 wheeled) Transfers: Sit to/from [facility] Sit to Stand: Min assist Stand pivot transfers: Min assist;Mod assist       General transfer comment: assisted from elevated bed to [facility] with 50% VC's on proper hand placement and safety with turns  Ambulation/Gait Ambulation/Gait assistance: Mod assist Gait Distance (Feet): 2 Feet Assistive device: Rolling walker (2 wheeled) Gait Pattern/deviations: Step-to pattern;Decreased step length - right;Decreased step length -  left;Shuffle;Decreased stance time - right;Trunk flexed Gait velocity: decreased   General Gait Details: very difficult to advance and maintain 50% WBing due to RA B UE's (hand grip on walker).  Very limited amb distance.   Stairs             Wheelchair Mobility    Modified Rankin (Stroke Patients Only)       Balance                                            Cognition Arousal/Alertness: Awake/alert Behavior During Therapy: WFL for tasks assessed/performed Overall Cognitive Status: Within Functional Limits for tasks assessed                                 General Comments: AxO x 3 pleasant      Exercises      General Comments        Pertinent Vitals/Pain Pain Assessment: 0-10 Pain Score: 8  Pain Location: R knee Pain Descriptors / Indicators: Aching;Sore Pain Intervention(s): Monitored during session;Repositioned;Premedicated before session;Ice applied    Home Living                      Prior Function            PT Goals (current goals can now be found in the care plan section) Progress towards PT goals: Progressing toward goals    Frequency  7X/week      PT Plan Current plan remains appropriate    Co-evaluation              AM-PAC PT "6 Clicks" Mobility   Outcome Measure  Help needed turning from your back to your side while in a flat bed without using bedrails?: A Lot Help needed moving from lying on your back to sitting on the side of a flat bed without using bedrails?: A Lot Help needed moving to and from a bed to a chair (including a wheelchair)?: A Lot Help needed standing up from a chair using your arms (e.g., wheelchair or bedside chair)?: A Lot Help needed to walk in hospital room?: A Lot Help needed climbing 3-5 steps with a railing? : Total 6 Click Score: 11    End of Session Equipment Utilized During Treatment: Gait belt Activity Tolerance: Patient limited by  fatigue;Patient limited by pain Patient left: in chair;with call bell/phone within reach;with chair alarm set Nurse Communication: Mobility status PT Visit Diagnosis: Difficulty in walking, not elsewhere classified (R26.2);Muscle weakness (generalized) (M62.81);Pain Pain - Right/Left: Right Pain - part of body: Knee     Time: 1035-1105 PT Time Calculation (min) (ACUTE ONLY): 30 min  Charges:  $Gait Training: 8-22 mins $Therapeutic Activity: 8-22 mins                     {Mattye Verdone  PTA Acute  Rehabilitation Services Pager      [phone] Office      [phone]

## 2020-12-14 LAB — CBC
HCT: 27.1 % — ABNORMAL LOW (ref 36.0–46.0)
Hemoglobin: 8.8 g/dL — ABNORMAL LOW (ref 12.0–15.0)
MCH: 36.2 pg — ABNORMAL HIGH (ref 26.0–34.0)
MCHC: 32.5 g/dL (ref 30.0–36.0)
MCV: 111.5 fL — ABNORMAL HIGH (ref 80.0–100.0)
Platelets: 140 10*3/uL — ABNORMAL LOW (ref 150–400)
RBC: 2.43 MIL/uL — ABNORMAL LOW (ref 3.87–5.11)
RDW: 16.5 % — ABNORMAL HIGH (ref 11.5–15.5)
WBC: 7 10*3/uL (ref 4.0–10.5)
nRBC: 0 % (ref 0.0–0.2)

## 2020-12-14 NOTE — Progress Notes (Signed)
Physical Therapy Treatment Patient Details Name: Helen Hardy MRN: [id number] DOB: [birth year] Today's Date: 12/14/2020    History of Present Illness Pt is a 70 y.o. female  s/p R TKR revision on 12/12/2020. Pt  with hx of bil TKR, RA, non-hodgkins lymphoma and back surgery    PT Comments    Pt tolerated increased ambulation distance of 8' with RW today, distance limited by R knee pain and fatigue. Performed R TKA exercises with assistance. Pt is only tolerating ~30* R knee flexion AAROM. She is progressing slowly with mobility.    Follow Up Recommendations  Home health PT;Follow surgeon's recommendation for DC plan and follow-up therapies     Equipment Recommendations  Rolling walker with 5" wheels    Recommendations for Other Services       Precautions / Restrictions Precautions Precautions: Fall;Knee Precaution Comments: Limit R knee flexion to 40* Restrictions Weight Bearing Restrictions: Yes RLE Weight Bearing: Partial weight bearing RLE Partial Weight Bearing Percentage or Pounds: 50% Other Position/Activity Restrictions: Limit R knee flexion to 40*    Mobility  Bed Mobility Overal bed mobility: Needs Assistance Bed Mobility: Supine to Sit     Supine to sit: Mod assist     General bed mobility comments: Increased time with assist to manage R LE and to bring trunk to upright    Transfers Overall transfer level: Needs assistance Equipment used: Rolling walker (2 wheeled) Transfers: Sit to/from [facility] Sit to Stand: Mod assist         General transfer comment: VCs hand placement, mod A to power up from recliner  Ambulation/Gait Ambulation/Gait assistance: Min assist Gait Distance (Feet): 8 Feet Assistive device: Rolling walker (2 wheeled) Gait Pattern/deviations: Step-to pattern;Decreased step length - right;Decreased step length - left;Shuffle;Decreased stance time - right;Trunk flexed Gait velocity: decreased   General Gait  Details: pt denied hand pain with use of RW today (this was an issue yesterday 2* RA in B hands), distance limited by R knee pain and general fatigue   Stairs             Wheelchair Mobility    Modified Rankin (Stroke Patients Only)       Balance Overall balance assessment: Needs assistance Sitting-balance support: No upper extremity supported;Feet supported Sitting balance-Leahy Scale: Good     Standing balance support: Bilateral upper extremity supported Standing balance-Leahy Scale: Poor                              Cognition Arousal/Alertness: Awake/alert Behavior During Therapy: WFL for tasks assessed/performed Overall Cognitive Status: Within Functional Limits for tasks assessed                                 General Comments: AxO x 3 pleasant      Exercises Total Joint Exercises Ankle Circles/Pumps: AROM;Both;15 reps;Supine Quad Sets: AROM;Right;5 reps;Supine Short Arc Quad: AROM;Right;10 reps;Supine Heel Slides: AAROM;Right;10 reps;Supine Goniometric ROM: 0-30* AAROM R knee    General Comments        Pertinent Vitals/Pain Pain Score: 8  Pain Location: R knee Pain Descriptors / Indicators: Aching;Sore Pain Intervention(s): Limited activity within patient's tolerance;Monitored during session;Premedicated before session;Ice applied    Home Living                      Prior Function  PT Goals (current goals can now be found in the care plan section) Acute Rehab PT Goals Patient Stated Goal: Regain IND and not have to go to SNF PT Goal Formulation: With patient Time For Goal Achievement: 12/26/20 Potential to Achieve Goals: Good Progress towards PT goals: Progressing toward goals    Frequency    7X/week      PT Plan Current plan remains appropriate    Co-evaluation              AM-PAC PT "6 Clicks" Mobility   Outcome Measure  Help needed turning from your back to your side while  in a flat bed without using bedrails?: A Lot Help needed moving from lying on your back to sitting on the side of a flat bed without using bedrails?: A Lot Help needed moving to and from a bed to a chair (including a wheelchair)?: A Lot Help needed standing up from a chair using your arms (e.g., wheelchair or bedside chair)?: A Lot Help needed to walk in hospital room?: A Lot Help needed climbing 3-5 steps with a railing? : Total 6 Click Score: 11    End of Session Equipment Utilized During Treatment: Gait belt Activity Tolerance: Patient limited by fatigue;Patient limited by pain Patient left: in chair;with call bell/phone within reach;with chair alarm set Nurse Communication: Mobility status PT Visit Diagnosis: Difficulty in walking, not elsewhere classified (R26.2);Muscle weakness (generalized) (M62.81);Pain Pain - Right/Left: Right Pain - part of body: Knee     Time: 9150-4136 PT Time Calculation (min) (ACUTE ONLY): 29 min  Charges:  $Gait Training: 8-22 mins $Therapeutic Exercise: 8-22 mins                     Blondell Reveal Kistler PT 12/14/2020  Acute Rehabilitation Services Pager [phone] Office [phone]

## 2020-12-14 NOTE — Plan of Care (Signed)
  Problem: Pain Management: Goal: Pain level will decrease with appropriate interventions Outcome: Progressing

## 2020-12-14 NOTE — Progress Notes (Signed)
Orthopedics Progress Note  Subjective: Patient reports weakness when she gets up and being unsteady.  She does not  feel safe at this point going home and says [place] and husband can only help so much.  Objective:  Vitals:   12/13/20 2153 12/14/20 0450  BP: (!) 181/85 (!) 148/113  Pulse: 85 93  Resp: 16 16  Temp: 98.6 F (37 C) 98.5 F (36.9 C)  SpO2: 100% 99%    General: Awake and alert  Musculoskeletal: Right knee dressing CDI, compartments supple. No pain with AROM of the ankle Neurovascularly intact  Lab Results  Component Value Date   WBC 7.0 12/14/2020   HGB 8.8 (L) 12/14/2020   HCT 27.1 (L) 12/14/2020   MCV 111.5 (H) 12/14/2020   PLT 140 (L) 12/14/2020       Component Value Date/Time   NA 139 12/13/2020 0314   NA 144 04/29/2017 0929   K 4.7 12/13/2020 0314   K 4.0 04/29/2017 0929   CL 106 12/13/2020 0314   CL 103 06/21/2015 0800   CO2 25 12/13/2020 0314   CO2 25 04/29/2017 0929   GLUCOSE 120 (H) 12/13/2020 0314   GLUCOSE 119 04/29/2017 0929   GLUCOSE 108 06/21/2015 0800   BUN 26 (H) 12/13/2020 0314   BUN 17.7 04/29/2017 0929   CREATININE 1.25 (H) 12/13/2020 0314   CREATININE 1.27 (H) 11/22/2020 1318   CREATININE 0.8 04/29/2017 0929   CALCIUM 8.5 (L) 12/13/2020 0314   CALCIUM 9.3 04/29/2017 0929   GFRNONAA 46 (L) 12/13/2020 0314   GFRNONAA 45 (L) 11/22/2020 1318   GFRAA >60 04/05/2020 0602   GFRAA 46 (L) 03/19/2020 1015    Lab Results  Component Value Date   INR 1.11 03/29/2015   INR 1.3 02/07/2008   INR 2.5 (H) 12/27/2007    Assessment/Plan: POD #2 s/p Procedure(s): TOTAL KNEE REVISION Stable from ortho standpoint. Continue mobilization with therapy. DVT prophylaxis.  Discharge planning.  Doran Heater. Veverly Fells, MD 12/14/2020 9:32 AM

## 2020-12-14 NOTE — Plan of Care (Signed)
  Problem: Activity: Goal: Ability to avoid complications of mobility impairment will improve Outcome: Progressing   Problem: Education: Goal: Knowledge of the prescribed therapeutic regimen will improve Outcome: Progressing   Problem: Pain Management: Goal: Pain level will decrease with appropriate interventions Outcome: Progressing

## 2020-12-15 MED ORDER — KETOROLAC TROMETHAMINE 15 MG/ML IJ SOLN
7.5000 mg | Freq: Four times a day (QID) | INTRAMUSCULAR | Status: AC
  Administered 2020-12-15 (×4): 7.5 mg via INTRAVENOUS
  Filled 2020-12-15 (×4): qty 1

## 2020-12-15 NOTE — Plan of Care (Signed)
  Problem: Education: Goal: Knowledge of the prescribed therapeutic regimen will improve Outcome: Progressing   Problem: Activity: Goal: Ability to avoid complications of mobility impairment will improve Outcome: Progressing   Problem: Pain Management: Goal: Pain level will decrease with appropriate interventions Outcome: Progressing   

## 2020-12-15 NOTE — Progress Notes (Signed)
Orthopedic Tech Progress Note Patient Details:  Helen Hardy 08/28/1950 [id number]  Ortho Devices Type of Ortho Device: Knee Immobilizer Ortho Device/Splint Location: RLE Ortho Device/Splint Interventions: Ordered, Application   Post Interventions Patient Tolerated: Well Instructions Provided: Adjustment of device  Germaine Pomfret 12/15/2020, 10:38 AM

## 2020-12-15 NOTE — Progress Notes (Signed)
Physical Therapy Treatment Patient Details Name: Helen Hardy MRN: [id number] DOB: 12-12-1950 Today's Date: 12/15/2020    History of Present Illness Pt is a 70 y.o. female  s/p R TKR revision on 12/12/2020. Pt  with hx of bil TKR, RA, non-hodgkins lymphoma and back surgery.  12/15/20 - Dr Helen Hardy increased to [facility].    PT Comments    Pt continues very cooperative but progressing very slowly with mobility - pt continues limited by pain, premorbid deconditioning, limited WB tolerance R LE and obesity.   Follow Up Recommendations  Home health PT;Follow surgeon's recommendation for DC plan and follow-up therapies     Equipment Recommendations  Rolling walker with 5" wheels    Recommendations for Other Services       Precautions / Restrictions Precautions Precautions: Fall;Knee Precaution Comments: Limit R knee flexion to 40* Restrictions Weight Bearing Restrictions: No RLE Weight Bearing: Weight bearing as tolerated Other Position/Activity Restrictions: Pt increased to WBAT by Dr Helen Hardy 12/15/20    Mobility  Bed Mobility Overal bed mobility: Needs Assistance Bed Mobility: Supine to Sit     Supine to sit: Mod assist     General bed mobility comments: Increased time with assist to manage R LE and to bring trunk to upright    Transfers Overall transfer level: Needs assistance Equipment used: Rolling walker (2 wheeled) Transfers: Sit to/from [facility] Sit to Stand: Mod assist Stand pivot transfers: Min assist;Mod assist       General transfer comment: VCs hand placement, mod A to power up from recliner  Ambulation/Gait Ambulation/Gait assistance: Min assist Gait Distance (Feet): 3 Feet Assistive device: Rolling walker (2 wheeled) Gait Pattern/deviations: Step-to pattern;Decreased step length - right;Decreased step length - left;Shuffle;Decreased stance time - right;Trunk flexed Gait velocity: decreased   General Gait Details: cues for sequence,  posture, position from RW; pt with difficulty advancing either foot and with very limited WB tolerance on R LE.   Stairs             Wheelchair Mobility    Modified Rankin (Stroke Patients Only)       Balance Overall balance assessment: Needs assistance Sitting-balance support: No upper extremity supported;Feet supported Sitting balance-Leahy Scale: Good     Standing balance support: Bilateral upper extremity supported Standing balance-Leahy Scale: Poor                              Cognition Arousal/Alertness: Awake/alert Behavior During Therapy: WFL for tasks assessed/performed Overall Cognitive Status: Within Functional Limits for tasks assessed                                 General Comments: AxO x 3 pleasant      Exercises Total Joint Exercises Ankle Circles/Pumps: AROM;Both;15 reps;Supine Quad Sets: AROM;Supine;10 reps;Both Heel Slides: AAROM;Right;Supine;15 reps Straight Leg Raises: AAROM;Right;15 reps;Supine Goniometric ROM: 0- 25 AAROM    General Comments        Pertinent Vitals/Pain Pain Assessment: 0-10 Pain Score: 8  Pain Location: R knee Pain Descriptors / Indicators: Aching;Sore Pain Intervention(s): Limited activity within patient's tolerance;Monitored during session;Premedicated before session;Ice applied    Home Living                      Prior Function            PT Goals (current goals can now be  found in the care plan section) Acute Rehab PT Goals Patient Stated Goal: Regain IND and not have to go to SNF PT Goal Formulation: With patient Time For Goal Achievement: 12/26/20 Potential to Achieve Goals: Good Progress towards PT goals: Not progressing toward goals - comment    Frequency    7X/week      PT Plan Current plan remains appropriate    Co-evaluation              AM-PAC PT "6 Clicks" Mobility   Outcome Measure  Help needed turning from your back to your side while in  a flat bed without using bedrails?: A Lot Help needed moving from lying on your back to sitting on the side of a flat bed without using bedrails?: A Lot Help needed moving to and from a bed to a chair (including a wheelchair)?: A Lot Help needed standing up from a chair using your arms (e.g., wheelchair or bedside chair)?: A Lot Help needed to walk in hospital room?: A Lot Help needed climbing 3-5 steps with a railing? : Total 6 Click Score: 11    End of Session Equipment Utilized During Treatment: Gait belt Activity Tolerance: Patient limited by fatigue;Patient limited by pain Patient left: in chair;with call bell/phone within reach;with chair alarm set Nurse Communication: Mobility status PT Visit Diagnosis: Difficulty in walking, not elsewhere classified (R26.2);Muscle weakness (generalized) (M62.81);Pain Pain - Right/Left: Right Pain - part of body: Knee     Time: 7741-4239 PT Time Calculation (min) (ACUTE ONLY): 39 min  Charges:  $Therapeutic Exercise: 8-22 mins $Therapeutic Activity: 8-22 mins                     Helen Hardy PT Acute Rehabilitation Services Pager [phone] Office [phone]    Helen Hardy 12/15/2020, 12:54 PM

## 2020-12-15 NOTE — Plan of Care (Signed)
  Problem: Pain Management: Goal: Pain level will decrease with appropriate interventions 12/15/2020 2129 by Blase Mess, RN Outcome: Progressing 12/15/2020 2128 by Blase Mess, RN Outcome: Progressing

## 2020-12-15 NOTE — Progress Notes (Signed)
Physical Therapy Treatment Patient Details Name: Helen Hardy MRN: [id number] DOB: [birth year] Today's Date: 12/15/2020    History of Present Illness Pt is a 70 y.o. female  s/p R TKR revision on 12/12/2020. Pt  with hx of bil TKR, RA, non-hodgkins lymphoma and back surgery.  12/15/20 - Dr Alvan Dame increased to [facility].    PT Comments    Pt continues very motivated but progressing slowly with mobility and requiring increased time and significant assist for all tasks..  This date, pt up to ambulate increased distance (made it into hallway for first time) with RW - KI in place. Pt pleased with progress this pm.  Follow Up Recommendations  Home health PT;Follow surgeon's recommendation for DC plan and follow-up therapies     Equipment Recommendations  Rolling walker with 5" wheels    Recommendations for Other Services       Precautions / Restrictions Precautions Precautions: Fall;Knee Precaution Comments: Limit R knee flexion to 40* Restrictions Weight Bearing Restrictions: No RLE Weight Bearing: Weight bearing as tolerated Other Position/Activity Restrictions: Pt increased to WBAT by Dr Alvan Dame 12/15/20    Mobility  Bed Mobility               General bed mobility comments: Pt up in chair and requests back to same    Transfers Overall transfer level: Needs assistance Equipment used: Rolling walker (2 wheeled) Transfers: Sit to/from Stand Sit to Stand: Mod assist;+2 physical assistance         General transfer comment: VCs hand placement, mod A to power up from recliner  Ambulation/Gait Ambulation/Gait assistance: Min assist Gait Distance (Feet): 13 Feet Assistive device: Rolling walker (2 wheeled) Gait Pattern/deviations: Step-to pattern;Decreased step length - right;Decreased step length - left;Shuffle;Decreased stance time - right;Trunk flexed Gait velocity: decreased   General Gait Details: Increased time with cues for sequence, posture, position from  [employer]             Wheelchair Mobility    Modified Rankin (Stroke Patients Only)       Balance Overall balance assessment: Needs assistance Sitting-balance support: No upper extremity supported;Feet supported Sitting balance-Leahy Scale: Good     Standing balance support: Bilateral upper extremity supported Standing balance-Leahy Scale: Poor                              Cognition Arousal/Alertness: Awake/alert Behavior During Therapy: WFL for tasks assessed/performed Overall Cognitive Status: Within Functional Limits for tasks assessed                                 General Comments: AxO x 3 pleasant      Exercises      General Comments        Pertinent Vitals/Pain Pain Assessment: 0-10 Pain Score: 5  Pain Location: R knee Pain Descriptors / Indicators: Aching;Sore Pain Intervention(s): Limited activity within patient's tolerance;Monitored during session;Premedicated before session;Ice applied    Home Living                      Prior Function            PT Goals (current goals can now be found in the care plan section) Acute Rehab PT Goals Patient Stated Goal: Regain IND and not have to go to SNF PT Goal Formulation: With patient Time For Goal Achievement: 12/26/20  Potential to Achieve Goals: Good Progress towards PT goals: Progressing toward goals    Frequency    7X/week      PT Plan Current plan remains appropriate    Co-evaluation              AM-PAC PT "6 Clicks" Mobility   Outcome Measure  Help needed turning from your back to your side while in a flat bed without using bedrails?: A Lot Help needed moving from lying on your back to sitting on the side of a flat bed without using bedrails?: A Lot Help needed moving to and from a bed to a chair (including a wheelchair)?: A Lot Help needed standing up from a chair using your arms (e.g., wheelchair or bedside chair)?: A Lot Help needed  to walk in hospital room?: A Lot Help needed climbing 3-5 steps with a railing? : Total 6 Click Score: 11    End of Session Equipment Utilized During Treatment: Gait belt Activity Tolerance: Patient tolerated treatment well;Patient limited by fatigue Patient left: in chair;with call bell/phone within reach;with chair alarm set;with family/visitor present Nurse Communication: Mobility status PT Visit Diagnosis: Difficulty in walking, not elsewhere classified (R26.2);Muscle weakness (generalized) (M62.81);Pain Pain - Right/Left: Right Pain - part of body: Knee     Time: 5809-9833 PT Time Calculation (min) (ACUTE ONLY): 27 min  Charges:  $Gait Training: 23-37 mins                     [place] Pager [phone] Office [phone]    [facility] 12/15/2020, 4:37 PM

## 2020-12-15 NOTE — Progress Notes (Signed)
Subjective: 3 Days Post-Op Procedure(s) (LRB): TOTAL KNEE REVISION (Right) Patient reports pain as moderate.    Objective: Vital signs in last 24 hours: Temp:  [98.5 F (36.9 C)-98.6 F (37 C)] 98.6 F (37 C) (06/12 0618) Pulse Rate:  [80-95] 95 (06/12 0618) Resp:  [16-20] 20 (06/12 0618) BP: (158-162)/(80-93) 162/87 (06/12 0618) SpO2:  [97 %-99 %] 97 % (06/12 0618)  Intake/Output from previous day: 06/11 0701 - 06/12 0700 In: 480 [P.O.:480] Out: 500 [Urine:500] Intake/Output this shift: No intake/output data recorded.  Recent Labs    12/13/20 0314 12/14/20 0255  HGB 8.7* 8.8*   Recent Labs    12/13/20 0314 12/14/20 0255  WBC 7.0 7.0  RBC 2.42* 2.43*  HCT 27.8* 27.1*  PLT 126* 140*   Recent Labs    12/13/20 0314  NA 139  K 4.7  CL 106  CO2 25  BUN 26*  CREATININE 1.25*  GLUCOSE 120*  CALCIUM 8.5*   No results for input(s): LABPT, INR in the last 72 hours.  Neurovascular intact Incision: dressing C/D/I   Assessment/Plan: 3 Days Post-Op Procedure(s) (LRB): TOTAL KNEE REVISION (Right) Up with therapy Reviewed goals and encouraged her to continue to work on her functional ability I will change her weight bearing status to WBAT to aid in her mobility.  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

## 2020-12-15 NOTE — Plan of Care (Signed)
  Problem: Pain Management: Goal: Pain level will decrease with appropriate interventions Outcome: Progressing

## 2020-12-15 NOTE — TOC Progression Note (Signed)
Transition of Care [facility]) - Progression Note   Patient Details  Name: Doranne Schmutz MRN: [id number] Date of Birth: 1950/07/29  Transition of Care [facility]) CM/SW [place], LCSW Phone Number: 12/15/2020, 11:22 AM  Clinical Narrative: Orders have been placed for HHPT/OT. CSW spoke with patient and confirmed patient's youth walker was received. Patient does not have a preference for [facility] agency, but has had [facility] in the past. CSW made [facility] referral to [place] with [facility]. Patient updated.  Expected Discharge Plan: [place] Barriers to Discharge: Continued Medical Work up  Expected Discharge Plan and Services Expected Discharge Plan: [place] In-house Referral: Clinical Social Work Post Acute Care Choice: Durable Medical Equipment, Home Health Living arrangements for the past 2 months: [place]            DME Arranged: Environmental consultant youth DME Agency: [employer] Representative spoke with at DME Agency: Pre-arranged in orthopedist's office HH Arranged: PT, OT [place] Agency: [place] ([facility]) Date HH Agency Contacted: 12/15/20 Time [place]: 19 Representative spoke with at [place]: [place]  Readmission Risk Interventions Readmission Risk Prevention Plan 12/13/2020  Transportation Screening Complete  Home Care Screening Complete  Medication Review (RN CM) Complete  Some recent data might be hidden

## 2020-12-16 ENCOUNTER — Other Ambulatory Visit (HOSPITAL_COMMUNITY): Payer: Self-pay

## 2020-12-16 NOTE — Progress Notes (Signed)
Physical Therapy Treatment Patient Details Name: Helen Hardy MRN: [id number] DOB: Dec 16, 1950 Today's Date: 12/16/2020    History of Present Illness Pt is a 70 y.o. female  s/p R TKR revision on 12/12/2020. Pt  with hx of bil TKR, RA, non-hodgkins lymphoma and back surgery.  12/15/20 - Dr Alvan Dame increased to [facility].    PT Comments    POD # 4 pm session Pt just got back to bed from using BSC so performed some TKR TE's followed by ICE.   Follow Up Recommendations  SNF     Equipment Recommendations       Recommendations for Other Services       Precautions / Restrictions Precautions Precautions: Fall;Knee Precaution Comments: Limit R knee flexion to 40* Restrictions Weight Bearing Restrictions: No RLE Weight Bearing: Weight bearing as tolerated Other Position/Activity Restrictions: Pt increased to WBAT by Dr Alvan Dame 12/15/20    Mobility      Balance                                            Cognition Arousal/Alertness: Awake/alert Behavior During Therapy: [facility] for tasks assessed/performed Overall Cognitive Status: Within Functional Limits for tasks assessed                                 General Comments: AxO x 3 pleasant      Exercises  Total Knee Replacement TE's following HEP handout 10 reps B LE ankle pumps 05 reps towel squeezes 05 reps knee presses 05 reps heel slides  (limited knee flex 40 degrees) 05 reps SAQ's 05 reps SLR's 05 reps ABD Educated on use of gait belt to assist with TE's Followed by ICE     General Comments        Pertinent Vitals/Pain Pain Assessment: Faces Pain Score: 5  Faces Pain Scale: Hurts even more Pain Location: R knee Pain Descriptors / Indicators: Aching;Sore;Operative site guarding Pain Intervention(s): Monitored during session;Premedicated before session;Repositioned;Ice applied    Home Living                      Prior Function            PT Goals (current goals can  now be found in the care plan section) Progress towards PT goals: Progressing toward goals    Frequency    7X/week      PT Plan Current plan remains appropriate    Co-evaluation              AM-PAC PT "6 Clicks" Mobility   Outcome Measure  Help needed turning from your back to your side while in a flat bed without using bedrails?: A Lot Help needed moving from lying on your back to sitting on the side of a flat bed without using bedrails?: A Lot Help needed moving to and from a bed to a chair (including a wheelchair)?: A Lot Help needed standing up from a chair using your arms (e.g., wheelchair or bedside chair)?: A Lot Help needed to walk in hospital room?: A Lot Help needed climbing 3-5 steps with a railing? : Total 6 Click Score: 11    End of Session Equipment Utilized During Treatment: Gait belt Activity Tolerance: Patient limited by fatigue Patient left: in bed;with call bell/phone within reach;with bed alarm  set;with family/visitor present Nurse Communication: Mobility status PT Visit Diagnosis: Difficulty in walking, not elsewhere classified (R26.2);Muscle weakness (generalized) (M62.81);Pain Pain - Right/Left: Right Pain - part of body: Knee     Time: 1350-1410 PT Time Calculation (min) (ACUTE ONLY): 20 min  Charges:  $Therapeutic Exercise: 8-22 mins                     Rica Koyanagi  PTA Acute  Rehabilitation Services Pager      [phone] Office      [phone]

## 2020-12-16 NOTE — TOC Progression Note (Signed)
Transition of Care [facility]) - Progression Note    Patient Details  Name: Tabbetha Kutscher MRN: [id number] Date of Birth: 1951-01-18  Transition of Care [facility]) CM/SW Contact  Lennart Pall, LCSW Phone Number: 12/16/2020, 1:15 PM  Clinical Narrative:    Alerted by RN that pt continues to require quite a lot of assistance with PT and recommendations are now for SNF.  Met with pt who is in agreement with recommendation but hopeful will only be a couple of weeks.  Have alerted [facility] of change in plan to SNF.  Will begin bed search.   Expected Discharge Plan: [place] Barriers to Discharge: Continued Medical Work up  Expected Discharge Plan and Services Expected Discharge Plan: [place] In-house Referral: Clinical Social Work   Post Acute Care Choice: Durable Medical Equipment, Home Health Living arrangements for the past 2 months: [place]                 DME Arranged: Environmental consultant youth DME Agency: [employer]     Representative spoke with at DME Agency: Pre-arranged in orthopedist's office HH Arranged: PT, OT [place] Agency: [place] ([facility]) Date HH Agency Contacted: 12/15/20 Time [place]: 1017 Representative spoke with at [place]: [place] ([place]) Interventions    Readmission Risk Interventions Readmission Risk Prevention Plan 12/13/2020  Transportation Screening Complete  Home Care Screening Complete  Medication Review (RN CM) Complete  Some recent data might be hidden

## 2020-12-16 NOTE — Progress Notes (Signed)
Subjective: 4 Days Post-Op Procedure(s) (LRB): TOTAL KNEE REVISION (Right) Patient reports pain as moderate.  Making some progress with therapy  Objective: Vital signs in last 24 hours: Temp:  [98 F (36.7 C)-98.7 F (37.1 C)] 98 F (36.7 C) (06/13 0600) Pulse Rate:  [76-86] 84 (06/13 0600) Resp:  [16-18] 16 (06/13 0600) BP: (137-159)/(72-89) 159/89 (06/13 0600) SpO2:  [96 %-100 %] 100 % (06/13 0600)  Intake/Output from previous day: 06/12 0701 - 06/13 0700 In: 960 [P.O.:960] Out: 400 [Urine:400] Intake/Output this shift: No intake/output data recorded.  Recent Labs    12/14/20 0255  HGB 8.8*   Recent Labs    12/14/20 0255  WBC 7.0  RBC 2.43*  HCT 27.1*  PLT 140*   No results for input(s): NA, K, CL, CO2, BUN, CREATININE, GLUCOSE, CALCIUM in the last 72 hours. No results for input(s): LABPT, INR in the last 72 hours.  Neurologically intact Incision: dressing C/D/I Tender to palpation around knee   Assessment/Plan: 4 Days Post-Op Procedure(s) (LRB): TOTAL KNEE REVISION (Right) Up with therapy Would like to continue inpatient acute PT to work on basics She has a bedside commode We have ordered [place] Work with patient for discharge to home likely tomorrow with some more PT today and maybe tomorrow     [name] 12/16/2020, 7:29 AM

## 2020-12-16 NOTE — [place] FL2 (Signed)
[place] MEDICAID FL2 LEVEL OF CARE SCREENING TOOL     IDENTIFICATION  Patient Name: Helen Hardy Birthdate: 1951/01/13 Sex: female Admission Date (Current Location): 12/12/2020  [facility] and [place] Number:  Herbalist and Address:  [facility],  [place], [place]      Provider Number: [id number]  Attending Physician Name and Address:  Paralee Cancel, MD  Relative Name and Phone Number:  Rogelio Seen @ 76-    Current Level of Care: Hospital Recommended Level of Care: [place] Prior Approval Number:    Date Approved/Denied:   PASRR Number: [id number]  Discharge Plan: SNF    Current Diagnoses: 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   GI bleed 03/25/2020   SIRS (systemic inflammatory response syndrome) ([place]) 03/25/2020   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[place]) 12/25/2019   Cecal diverticulitis 12/25/2019   Rheumatoid arthritis ([place]) 12/25/2019   GERD (gastroesophageal reflux disease) 12/25/2019   Essential hypertension 12/25/2019   AKI (acute kidney injury) ([place]) 12/25/2019   Hyperglycemia 12/25/2019   Chronic radicular lumbar pain 12/25/2019   Generalized weakness 12/25/2019   Anemia of chronic renal failure, stage 3 (moderate) ([place]) 12/30/2018   Lumbar disc herniation 03/04/2016   Iron deficiency anemia 06/21/2015   Malabsorption of iron 06/21/2015   Morbid obesity ([place]) 04/10/2015   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   S/P knee replacement 04/08/2015    Orientation RESPIRATION BLADDER Height & Weight     Self, Time, Situation  Normal Continent Weight: 216 lb 6.4 oz (98.2 kg) Height:  4' 11"  (149.9 cm)  BEHAVIORAL SYMPTOMS/MOOD NEUROLOGICAL BOWEL NUTRITION STATUS      Continent    AMBULATORY STATUS COMMUNICATION OF NEEDS Skin   Limited Assist   Surgical wounds                       Personal Care Assistance Level of Assistance  Bathing, Dressing  Bathing Assistance: Limited assistance   Dressing Assistance: Limited assistance     Functional Limitations Info             [place]  PT (By licensed PT), OT (By licensed OT)     PT Frequency: 5x/wk OT Frequency: 5x/wk            Contractures Contractures Info: Not present    Additional Factors Info  Code Status, Allergies Code Status Info: Full Allergies Info: see MAR           Current Medications (12/16/2020):  This is the current hospital active medication list Current Facility-Administered Medications  Medication Dose Route Frequency Provider Last Rate Last Admin   0.9 %  sodium chloride infusion   Intravenous Continuous Irving Copas, PA-C 75 mL/hr at 12/12/20 2320 New Bag at 12/12/20 2320   acetaminophen (TYLENOL) tablet 1,000 mg  1,000 mg Oral Bonnita Hollow, MD   1,000 mg at 12/16/20 5465   acetaminophen (TYLENOL) tablet 325-650 mg  325-650 mg Oral Q6H PRN Irving Copas, PA-C       aspirin chewable tablet 81 mg  81 mg Oral BID Irving Copas, PA-C   81 mg at 12/16/20 0354   bisacodyl (DULCOLAX) suppository 10 mg  10 mg Rectal Daily PRN Irving Copas, PA-C       Chlorhexidine Gluconate Cloth 2 % PADS 6 each  6 each Topical Q0600 Paralee Cancel, MD   6  each at 12/13/20 0952   diphenhydrAMINE (BENADRYL) 12.5 MG/5ML elixir 12.5-25 mg  12.5-25 mg Oral Q4H PRN Irving Copas, PA-C       docusate sodium (COLACE) capsule 100 mg  100 mg Oral BID Irving Copas, PA-C   100 mg at 12/16/20 6381   ferrous sulfate tablet 325 mg  325 mg Oral TID PC Irving Copas, PA-C   325 mg at 12/16/20 0825   furosemide (LASIX) tablet 40 mg  40 mg Oral q AM Costella Hatcher R, PA-C   40 mg at 12/16/20 0825   gabapentin (NEURONTIN) capsule 300 mg  300 mg Oral BID BM Paralee Cancel, MD   300 mg at 12/16/20 7711   And   gabapentin (NEURONTIN) capsule 600 mg  600 mg Oral QHS Paralee Cancel, MD   600 mg at 12/15/20 2112   HYDROmorphone (DILAUDID)  injection 0.5-1 mg  0.5-1 mg Intravenous Q4H PRN Irving Copas, PA-C   1 mg at 12/15/20 0257   irbesartan (AVAPRO) tablet 150 mg  150 mg Oral Daily Irving Copas, PA-C   150 mg at 12/16/20 6579   menthol-cetylpyridinium (CEPACOL) lozenge 3 mg  1 lozenge Oral PRN Irving Copas, PA-C       Or   phenol (CHLORASEPTIC) mouth spray 1 spray  1 spray Mouth/Throat PRN Irving Copas, PA-C       methocarbamol (ROBAXIN) tablet 500 mg  500 mg Oral Q6H PRN Costella Hatcher R, PA-C   500 mg at 12/16/20 0383   Or   methocarbamol (ROBAXIN) 500 mg in dextrose 5 % 50 mL IVPB  500 mg Intravenous Q6H PRN Irving Copas, PA-C 100 mL/hr at 12/12/20 1515 500 mg at 12/12/20 1515   metoCLOPramide (REGLAN) tablet 5-10 mg  5-10 mg Oral Q8H PRN Irving Copas, PA-C       Or   metoCLOPramide (REGLAN) injection 5-10 mg  5-10 mg Intravenous Q8H PRN Irving Copas, PA-C       mupirocin ointment (BACTROBAN) 2 % 1 application  1 application Nasal BID Paralee Cancel, MD   1 application at 33/83/29 0924   ondansetron (ZOFRAN) tablet 4 mg  4 mg Oral Q6H PRN Costella Hatcher R, PA-C   4 mg at 12/13/20 0745   Or   ondansetron (ZOFRAN) injection 4 mg  4 mg Intravenous Q6H PRN Irving Copas, PA-C       oxyCODONE (Oxy IR/ROXICODONE) immediate release tablet 10-20 mg  10-20 mg Oral Q4H PRN Paralee Cancel, MD   10 mg at 12/16/20 1916   oxyCODONE (Oxy IR/ROXICODONE) immediate release tablet 5-10 mg  5-10 mg Oral Q4H PRN Irving Copas, PA-C   10 mg at 12/15/20 1722   polyethylene glycol (MIRALAX / GLYCOLAX) packet 17 g  17 g Oral Daily PRN Irving Copas, PA-C   17 g at 12/13/20 0748   potassium chloride SA (KLOR-CON) CR tablet 20 mEq  20 mEq Oral q AM Irving Copas, PA-C   20 mEq at 12/16/20 6060   predniSONE (DELTASONE) tablet 10 mg  10 mg Oral Q breakfast Irving Copas, PA-C   10 mg at 12/16/20 0459     Discharge Medications: Please see discharge summary for a list of discharge  medications.  Relevant Imaging Results:  Relevant Lab Results:   Additional Information SS# [id number];  COVID vaccine x 2; booster x 2  Jaun Galluzzo, LCSW

## 2020-12-16 NOTE — Plan of Care (Signed)

## 2020-12-16 NOTE — Progress Notes (Signed)
Physical Therapy Treatment Patient Details Name: Helen Hardy MRN: [id number] DOB: 1951-04-11 Today's Date: 12/16/2020    History of Present Illness Pt is a 70 y.o. female  s/p R TKR revision on 12/12/2020. Pt  with hx of bil TKR, RA, non-hodgkins lymphoma and back surgery.  12/15/20 - Dr Alvan Dame increased to [facility].    PT Comments    POD # 4 am session Pt NOT progressing enough to safely D/C to home.  Pt still requires + 2 assist with mobility and toileting.  Pt was unable to navigate one step she has to enter home.  Pt also not amb a "functional distance".  "My husband is not going to be able to lift me if I fall".  At this point, pt will need ST Rehab at [facility] and she agrees.  Reported to RN and will update LPT.    Follow Up Recommendations  SNF     Equipment Recommendations       Recommendations for Other Services       Precautions / Restrictions Precautions Precautions: Fall;Knee Precaution Comments: Limit R knee flexion to 40* Restrictions Weight Bearing Restrictions: No RLE Weight Bearing: Weight bearing as tolerated Other Position/Activity Restrictions: Pt increased to [facility] by Dr Alvan Dame 12/15/20    Mobility  Bed Mobility Overal bed mobility: Needs Assistance Bed Mobility: Supine to Sit     Supine to sit: Max assist     General bed mobility comments: pt required MAX Assist to transition to EOB using bed pad to complete scooting as pt was unable to self perform past 15%    Transfers Overall transfer level: Needs assistance Equipment used: Rolling walker (2 wheeled) Transfers: Sit to/from Stand Sit to Stand: Mod assist;+2 physical assistance Stand pivot transfers: Mod assist;+2 safety/equipment       General transfer comment: VCs hand placement, mod A to power up from EOB to [facility] then up to amb.  Ambulation/Gait Ambulation/Gait assistance: Min assist Gait Distance (Feet): 8 Feet Assistive device: Rolling walker (2 wheeled) Gait Pattern/deviations: Step-to  pattern;Decreased step length - right;Decreased step length - left;Shuffle;Decreased stance time - right;Trunk flexed Gait velocity: decreased   General Gait Details: decreased amb distance due to fatigue after BSC transfer.  Pt nervous about amb with "just one person" so called for second assist such that recliner was following.   Stairs Stairs: Yes       General stair comments: attempted but unable.  Pt has one step to enter her home.  Pt was only able to get close enough to place "all four walker legs" on step but was unable to weightshift enough to step up with left.  "I can't do it".   Wheelchair Mobility    Modified Rankin (Stroke Patients Only)       Balance                                            Cognition Arousal/Alertness: Awake/alert Behavior During Therapy: WFL for tasks assessed/performed Overall Cognitive Status: Within Functional Limits for tasks assessed                                 General Comments: AxO x 3 pleasant      Exercises      General Comments        Pertinent Vitals/Pain Pain  Assessment: 0-10 Pain Score: 5  Pain Location: R knee Pain Descriptors / Indicators: Aching;Sore;Operative site guarding Pain Intervention(s): Monitored during session;Repositioned;Ice applied;Premedicated before session    Home Living                      Prior Function            PT Goals (current goals can now be found in the care plan section) Progress towards PT goals: Progressing toward goals    Frequency    7X/week      PT Plan Current plan remains appropriate    Co-evaluation              AM-PAC PT "6 Clicks" Mobility   Outcome Measure  Help needed turning from your back to your side while in a flat bed without using bedrails?: A Lot Help needed moving from lying on your back to sitting on the side of a flat bed without using bedrails?: A Lot Help needed moving to and from a bed to a  chair (including a wheelchair)?: A Lot Help needed standing up from a chair using your arms (e.g., wheelchair or bedside chair)?: A Lot Help needed to walk in hospital room?: A Lot Help needed climbing 3-5 steps with a railing? : Total 6 Click Score: 11    End of Session Equipment Utilized During Treatment: Gait belt Activity Tolerance: Patient limited by fatigue Patient left: in chair;with call bell/phone within reach;with chair alarm set;with family/visitor present Nurse Communication: Mobility status PT Visit Diagnosis: Difficulty in walking, not elsewhere classified (R26.2);Muscle weakness (generalized) (M62.81);Pain Pain - Right/Left: Right Pain - part of body: Knee     Time: 8676-1950 PT Time Calculation (min) (ACUTE ONLY): 30 min  Charges:  $Gait Training: 8-22 mins $Therapeutic Activity: 8-22 mins                    Rica Koyanagi  PTA Acute  Rehabilitation Services Pager      [phone] Office      [phone]

## 2020-12-17 ENCOUNTER — Other Ambulatory Visit (HOSPITAL_COMMUNITY): Payer: Self-pay

## 2020-12-17 DIAGNOSIS — E8881 Metabolic syndrome: Secondary | ICD-10-CM | POA: Diagnosis not present

## 2020-12-17 DIAGNOSIS — T84018A Broken internal joint prosthesis, other site, initial encounter: Secondary | ICD-10-CM | POA: Diagnosis not present

## 2020-12-17 DIAGNOSIS — M6281 Muscle weakness (generalized): Secondary | ICD-10-CM | POA: Diagnosis not present

## 2020-12-17 DIAGNOSIS — R262 Difficulty in walking, not elsewhere classified: Secondary | ICD-10-CM | POA: Diagnosis not present

## 2020-12-17 DIAGNOSIS — M170 Bilateral primary osteoarthritis of knee: Secondary | ICD-10-CM | POA: Diagnosis not present

## 2020-12-17 DIAGNOSIS — Z96651 Presence of right artificial knee joint: Secondary | ICD-10-CM | POA: Diagnosis not present

## 2020-12-17 DIAGNOSIS — N1831 Chronic kidney disease, stage 3a: Secondary | ICD-10-CM | POA: Diagnosis not present

## 2020-12-17 DIAGNOSIS — R5381 Other malaise: Secondary | ICD-10-CM | POA: Diagnosis not present

## 2020-12-17 DIAGNOSIS — D649 Anemia, unspecified: Secondary | ICD-10-CM | POA: Diagnosis not present

## 2020-12-17 DIAGNOSIS — Z1331 Encounter for screening for depression: Secondary | ICD-10-CM | POA: Diagnosis not present

## 2020-12-17 DIAGNOSIS — D538 Other specified nutritional anemias: Secondary | ICD-10-CM | POA: Diagnosis not present

## 2020-12-17 DIAGNOSIS — K219 Gastro-esophageal reflux disease without esophagitis: Secondary | ICD-10-CM | POA: Diagnosis not present

## 2020-12-17 DIAGNOSIS — I10 Essential (primary) hypertension: Secondary | ICD-10-CM | POA: Diagnosis not present

## 2020-12-17 DIAGNOSIS — M6259 Muscle wasting and atrophy, not elsewhere classified, multiple sites: Secondary | ICD-10-CM | POA: Diagnosis not present

## 2020-12-17 DIAGNOSIS — R41841 Cognitive communication deficit: Secondary | ICD-10-CM | POA: Diagnosis not present

## 2020-12-17 DIAGNOSIS — C8590 Non-Hodgkin lymphoma, unspecified, unspecified site: Secondary | ICD-10-CM | POA: Diagnosis not present

## 2020-12-17 DIAGNOSIS — Z7401 Bed confinement status: Secondary | ICD-10-CM | POA: Diagnosis not present

## 2020-12-17 DIAGNOSIS — R531 Weakness: Secondary | ICD-10-CM | POA: Diagnosis not present

## 2020-12-17 DIAGNOSIS — Z96659 Presence of unspecified artificial knee joint: Secondary | ICD-10-CM | POA: Diagnosis not present

## 2020-12-17 DIAGNOSIS — M069 Rheumatoid arthritis, unspecified: Secondary | ICD-10-CM | POA: Diagnosis not present

## 2020-12-17 DIAGNOSIS — M519 Unspecified thoracic, thoracolumbar and lumbosacral intervertebral disc disorder: Secondary | ICD-10-CM | POA: Diagnosis not present

## 2020-12-17 DIAGNOSIS — N183 Chronic kidney disease, stage 3 unspecified: Secondary | ICD-10-CM | POA: Diagnosis not present

## 2020-12-17 DIAGNOSIS — G8929 Other chronic pain: Secondary | ICD-10-CM | POA: Diagnosis not present

## 2020-12-17 DIAGNOSIS — Z9189 Other specified personal risk factors, not elsewhere classified: Secondary | ICD-10-CM | POA: Diagnosis not present

## 2020-12-17 DIAGNOSIS — Z760 Encounter for issue of repeat prescription: Secondary | ICD-10-CM | POA: Diagnosis not present

## 2020-12-17 DIAGNOSIS — N178 Other acute kidney failure: Secondary | ICD-10-CM | POA: Diagnosis not present

## 2020-12-17 DIAGNOSIS — Z96652 Presence of left artificial knee joint: Secondary | ICD-10-CM | POA: Diagnosis not present

## 2020-12-17 DIAGNOSIS — R2681 Unsteadiness on feet: Secondary | ICD-10-CM | POA: Diagnosis not present

## 2020-12-17 DIAGNOSIS — R42 Dizziness and giddiness: Secondary | ICD-10-CM | POA: Diagnosis not present

## 2020-12-17 DIAGNOSIS — M0689 Other specified rheumatoid arthritis, multiple sites: Secondary | ICD-10-CM | POA: Diagnosis not present

## 2020-12-17 DIAGNOSIS — Z79890 Hormone replacement therapy (postmenopausal): Secondary | ICD-10-CM | POA: Diagnosis not present

## 2020-12-17 DIAGNOSIS — R1312 Dysphagia, oropharyngeal phase: Secondary | ICD-10-CM | POA: Diagnosis not present

## 2020-12-17 DIAGNOSIS — Z471 Aftercare following joint replacement surgery: Secondary | ICD-10-CM | POA: Diagnosis not present

## 2020-12-17 DIAGNOSIS — I150 Renovascular hypertension: Secondary | ICD-10-CM | POA: Diagnosis not present

## 2020-12-17 MED ORDER — DOCUSATE SODIUM 100 MG PO CAPS: 100.0000 mg | ORAL_CAPSULE | Freq: Two times a day (BID) | ORAL | 0 refills | Status: AC

## 2020-12-17 MED ORDER — METHOCARBAMOL 500 MG PO TABS: 500.0000 mg | ORAL_TABLET | Freq: Four times a day (QID) | ORAL | 0 refills | Status: DC | PRN

## 2020-12-17 MED ORDER — ASPIRIN 81 MG PO CHEW: 81.0000 mg | CHEWABLE_TABLET | Freq: Two times a day (BID) | ORAL | 0 refills | Status: AC

## 2020-12-17 MED ORDER — ACETAMINOPHEN 500 MG PO TABS: 1000.0000 mg | ORAL_TABLET | Freq: Three times a day (TID) | ORAL | 0 refills | Status: AC

## 2020-12-17 MED ORDER — OXYCODONE HCL 10 MG PO TABS: 10.0000 mg | ORAL_TABLET | ORAL | 0 refills | Status: DC | PRN

## 2020-12-17 MED ORDER — POLYETHYLENE GLYCOL 3350 17 G PO PACK: 17.0000 g | PACK | Freq: Every day | ORAL | 0 refills | Status: DC | PRN

## 2020-12-17 NOTE — Progress Notes (Signed)
Subjective: 5 Days Post-Op Procedure(s) (LRB): TOTAL KNEE REVISION (Right) Patient reports pain as moderate.  Still very limited with her post op mobility complicated by her comorbid conditions including RA  Objective: Vital signs in last 24 hours: Temp:  [98.2 F (36.8 C)-98.8 F (37.1 C)] 98.2 F (36.8 C) (06/14 0510) Pulse Rate:  [78-89] 89 (06/14 0510) Resp:  [16-17] 16 (06/14 0510) BP: (143-169)/(84-92) 169/89 (06/14 0510) SpO2:  [97 %-99 %] 97 % (06/14 0510)  Intake/Output from previous day: 06/13 0701 - 06/14 0700 In: 1420 [P.O.:1420] Out: 600 [Urine:600] Intake/Output this shift: No intake/output data recorded.  No results for input(s): HGB in the last 72 hours. No results for input(s): WBC, RBC, HCT, PLT in the last 72 hours. No results for input(s): NA, K, CL, CO2, BUN, CREATININE, GLUCOSE, CALCIUM in the last 72 hours. No results for input(s): LABPT, INR in the last 72 hours.  Neurovascular intact Incision: dressing C/D/I   Assessment/Plan: 5 Days Post-Op Procedure(s) (LRB): TOTAL KNEE REVISION (Right) Up with therapy Discharge to SNF - based on failed progression with activity for reasons noted she will now be transferred to [facility] RTC in 2 weeks for wound check Goals reviewed    [name] 12/17/2020, 9:06 AM

## 2020-12-17 NOTE — Discharge Summary (Signed)
Physician Discharge Summary   Patient ID: Helen Hardy MRN: [id number] DOB/AGE: 01/22/1951 70 y.o.  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  Admission Diagnoses:  Past Medical History:  Diagnosis Date   Anemia    Anemia of chronic renal failure, stage 3 (moderate) ([facility]) 12/30/2018   pt denies   GERD (gastroesophageal reflux disease)    History of ischemic colitis    Hospitalized   Hypertension    Malabsorption of iron 06/21/2015   Non Hodgkin's lymphoma (HCC)    Numbness and tingling of right leg    Pneumonia    x2   RA (rheumatoid arthritis) ([place] [facility])    Discharge Diagnoses:   Active Problems:   S/P revision of total knee, right  Estimated body mass index is 43.71 kg/m as calculated from the following:   Height as of this encounter: 4' 11"  (1.499 m).   Weight as of this encounter: 98.2 kg.  Procedure:  Procedure(s) (LRB): TOTAL KNEE REVISION (Right)   Consults: None  HPI: The patient is a 70 year old female who I have followed over the last handful of years for her knees.  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  We have already revised her left knee due to aseptic  failure.  She has been trying to have her right knee replaced.  We have been working on weight status, but her mobility has been severely limited and she has been unable to lose significant weight.  She wished at this point to proceed with revision of her right knee due to pain and dysfunction.  Risk of infection reviewed as it relates to modifiable risk factors, risks of DVT, component failure, need for future surgeries as well as any issues surrounding intraoperative findings.  Consent was obtained for benefit of pain relief.  The postoperative course was reviewed and discussed and stressed.    Laboratory Data: Admission on 12/12/2020  Component Date Value Ref Range  Status   Sodium 12/12/2020 144  135 - 145 mmol/L Final   Potassium 12/12/2020 4.3  3.5 - 5.1 mmol/L Final   Chloride 12/12/2020 108  98 - 111 mmol/L Final   CO2 12/12/2020 24  22 - 32 mmol/L Final   Glucose, Bld 12/12/2020 104 (A) 70 - 99 mg/dL Final   Glucose reference range applies only to samples taken after fasting for at least 8 hours.   BUN 12/12/2020 27 (A) 8 - 23 mg/dL Final   Creatinine, Ser 12/12/2020 1.08 (A) 0.44 - 1.00 mg/dL Final   Calcium 12/12/2020 9.3  8.9 - 10.3 mg/dL Final   GFR, Estimated 12/12/2020 55 (A) >60 mL/min Final   Comment: (NOTE) Calculated using the CKD-EPI Creatinine Equation (2021)    Anion gap 12/12/2020 12  5 - 15 Final   Performed at [facility], [place] [address], [place], [place] [address]   WBC 12/12/2020 6.5  4.0 - 10.5 K/uL Final   RBC 12/12/2020 3.00 (A) 3.87 - 5.11 MIL/uL Final   Hemoglobin 12/12/2020 10.9 (A) 12.0 - 15.0 g/dL Final   HCT 12/12/2020 34.8 (A) 36.0 - 46.0 % Final   MCV 12/12/2020 116.0 (A) 80.0 - 100.0 fL Final   MCH 12/12/2020 36.3 (A) 26.0 - 34.0 pg Final   MCHC 12/12/2020 31.3  30.0 - 36.0 g/dL Final   RDW 12/12/2020 16.9 (A) 11.5 - 15.5 % Final   Platelets 12/12/2020 154  150 - 400 K/uL Final   nRBC 12/12/2020 0.3 (A) 0.0 - 0.2 % Final   Performed at [facility], [place] [address], [place], [place] [address]   MRSA, PCR 12/12/2020 POSITIVE (A) NEGATIVE Final   Comment: RESULT CALLED TO, READ BACK BY AND VERIFIED WITH: P WILLIAMS AT 2356 ON 12/12/2020 BY MOSLEYJ    Staphylococcus aureus 12/12/2020 POSITIVE (A) NEGATIVE Final   Comment: (NOTE) The Xpert SA Assay (FDA approved for NASAL specimens in patients 68 years of age and older), is one component of a comprehensive surveillance program. It is not intended to diagnose infection nor to guide or monitor treatment. Performed at [facility], [place] [address], [place], [place] [address]    WBC 12/13/2020 7.0  4.0 -  10.5 K/uL Final   RBC 12/13/2020 2.42 (A) 3.87 - 5.11 MIL/uL Final   Hemoglobin 12/13/2020 8.7 (A) 12.0 - 15.0 g/dL Final   HCT 12/13/2020 27.8 (A) 36.0 - 46.0 % Final   MCV 12/13/2020 114.9 (A) 80.0 - 100.0 fL Final   MCH 12/13/2020 36.0 (A) 26.0 - 34.0 pg Final   MCHC 12/13/2020 31.3  30.0 - 36.0 g/dL Final   RDW 12/13/2020 16.4 (A) 11.5 - 15.5 % Final   Platelets 12/13/2020 126 (A) 150 - 400 K/uL Final   nRBC 12/13/2020 0.0  0.0 - 0.2 % Final   Performed at [facility], [place] [address], [place], [place] [address]   Sodium 12/13/2020 139  135 - 145 mmol/L Final   Potassium 12/13/2020 4.7  3.5 - 5.1 mmol/L Final   Chloride 12/13/2020 106  98 - 111 mmol/L Final   CO2 12/13/2020 25  22 - 32 mmol/L Final   Glucose, Bld 12/13/2020 120 (A) 70 - 99 mg/dL Final   Glucose reference range applies only to samples taken after fasting for at least 8 hours.   BUN 12/13/2020 26 (A) 8 - 23 mg/dL Final   Creatinine, Ser 12/13/2020 1.25 (A) 0.44 - 1.00 mg/dL Final   Calcium 12/13/2020 8.5 (A) 8.9 - 10.3 mg/dL Final   GFR, Estimated 12/13/2020 46 (A) >60 mL/min Final   Comment: (NOTE) Calculated using the CKD-EPI Creatinine Equation (2021)    Anion gap 12/13/2020 8  5 - 15 Final   Performed at [facility], [place] [address], [place], [place] [address]   WBC 12/14/2020 7.0  4.0 - 10.5 K/uL Final   RBC 12/14/2020 2.43 (A) 3.87 - 5.11 MIL/uL Final   Hemoglobin 12/14/2020 8.8 (A) 12.0 - 15.0 g/dL Final   HCT 12/14/2020 27.1 (A) 36.0 - 46.0 % Final   MCV 12/14/2020 111.5 (A) 80.0 - 100.0 fL Final   MCH 12/14/2020 36.2 (A) 26.0 - 34.0 pg Final   MCHC 12/14/2020 32.5  30.0 - 36.0 g/dL Final   RDW 12/14/2020 16.5 (A) 11.5 - 15.5 % Final   Platelets 12/14/2020 140 (A) 150 - 400 K/uL Final   nRBC 12/14/2020 0.0  0.0 - 0.2 % Final   Performed at [facility], [place] [address] [place], [place] [address]  Hospital Outpatient Visit on 12/03/2020  Component  Date Value Ref Range Status   SARS Coronavirus 2 12/03/2020 POSITIVE (A) NEGATIVE Final   Comment: (NOTE) SARS-CoV-2 target nucleic acids are DETECTED.  The SARS-CoV-2 RNA is generally detectable in upper and lower respiratory specimens during the acute phase of infection. Positive results are indicative of the presence of SARS-CoV-2 RNA. Clinical correlation with patient history  and other diagnostic information is  necessary to determine patient infection status. Positive results do not rule out bacterial infection or co-infection with other viruses.  The expected result is Negative.  Fact Sheet for Patients: SugarRoll.be  Fact Sheet for Healthcare Providers: https://www.woods-mathews.com/  This test is not yet approved or cleared by the [place] FDA and  has been authorized for detection and/or diagnosis of SARS-CoV-2 by FDA under an Emergency Use Authorization (EUA). This EUA will remain  in effect (meaning this test can be used) for the duration of the COVID-19 declaration under Section 564(b)(1) of the Act, 21 U.                          S.C. section 360bbb-3(b)(1), unless the authorization is terminated or revoked sooner.   Performed at [facility], [place] [address] [place], [place] [address]   Appointment on 11/22/2020  Component Date Value Ref Range Status   WBC Count 11/22/2020 6.8  4.0 - 10.5 K/uL Final   RBC 11/22/2020 2.86 (A) 3.87 - 5.11 MIL/uL Final   Hemoglobin 11/22/2020 10.4 (A) 12.0 - 15.0 g/dL Final   HCT 11/22/2020 32.0 (A) 36.0 - 46.0 % Final   MCV 11/22/2020 111.9 (A) 80.0 - 100.0 fL Final   MCH 11/22/2020 36.4 (A) 26.0 - 34.0 pg Final   MCHC 11/22/2020 32.5  30.0 - 36.0 g/dL Final   RDW 11/22/2020 14.8  11.5 - 15.5 % Final   Platelet Count 11/22/2020 137 (A) 150 - 400 K/uL Final   nRBC 11/22/2020 0.0  0.0 - 0.2 % Final   Neutrophils Relative % 11/22/2020 60  % Final   Neutro Abs 11/22/2020 4.1  1.7  - 7.7 K/uL Final   Lymphocytes Relative 11/22/2020 31  % Final   Lymphs Abs 11/22/2020 2.1  0.7 - 4.0 K/uL Final   Monocytes Relative 11/22/2020 8  % Final   Monocytes Absolute 11/22/2020 0.5  0.1 - 1.0 K/uL Final   Eosinophils Relative 11/22/2020 1  % Final   Eosinophils Absolute 11/22/2020 0.1  0.0 - 0.5 K/uL Final   Basophils Relative 11/22/2020 0  % Final   Basophils Absolute 11/22/2020 0.0  0.0 - 0.1 K/uL Final   Immature Granulocytes 11/22/2020 0  % Final   Abs Immature Granulocytes 11/22/2020 0.03  0.00 - 0.07 K/uL Final   Performed at [facility], [address] [place], [place] [address]   Sodium 11/22/2020 141  135 - 145 mmol/L Final   Potassium 11/22/2020 4.3  3.5 - 5.1 mmol/L Final   Chloride 11/22/2020 109  98 - 111 mmol/L Final   CO2 11/22/2020 21 (A) 22 - 32 mmol/L Final   Glucose, Bld 11/22/2020 124 (A) 70 - 99 mg/dL Final   Glucose reference range applies only to samples taken after fasting for at least 8 hours.   BUN 11/22/2020 33 (A) 8 - 23 mg/dL Final   Creatinine 11/22/2020 1.27 (A) 0.44 - 1.00 mg/dL Final   Calcium 11/22/2020 9.5  8.9 - 10.3 mg/dL Final   Total Protein 11/22/2020 6.6  6.5 - 8.1 g/dL Final   Albumin 11/22/2020 4.1  3.5 - 5.0 g/dL Final   AST 11/22/2020 17  15 - 41 U/L Final   ALT 11/22/2020 15  0 - 44 U/L Final   Alkaline Phosphatase 11/22/2020 53  38 - 126 U/L Final   Total Bilirubin 11/22/2020 0.4  0.3 - 1.2 mg/dL Final   GFR, Estimated 11/22/2020 45 (A) >60 mL/min Final   Comment: (NOTE) Calculated using the CKD-EPI Creatinine Equation (2021)    Anion gap 11/22/2020 11  5 - 15 Final   Performed at [facility], [address] [place], [place] [address]   Retic Ct Pct 11/22/2020 1.5  0.4 - 3.1 % Final   RBC. 11/22/2020 2.85 (A) 3.87 - 5.11 MIL/uL Final   Retic Count, Absolute 11/22/2020 43.0  19.0 - 186.0 K/uL Final   Immature Retic Fract 11/22/2020 19.2  (A) 2.3 - 15.9 % Final   Performed at [facility] at [facility], [place], [place] [address]   Ferritin 11/22/2020 2,353 (A) 11 - 307 ng/mL Final   Comment: RESULT CONFIRMED BY AUTOMATED DILUTION Performed at [facility], [place] [address], [place], [place] [address]    Iron 11/22/2020 124  41 - 142 ug/dL Final   TIBC 11/22/2020 223 (A) 236 - 444 ug/dL Final   Saturation Ratios 11/22/2020 56  21 - 57 % Final   UIBC 11/22/2020 98 (A) 120 - 384 ug/dL Final   Performed at [facility], [place] [address], [place], [place] [address]  Admission on 11/08/2020, Discharged on 11/08/2020  Component Date Value Ref Range Status   Sodium 11/08/2020 139  135 - 145 mmol/L Final   Potassium 11/08/2020 4.2  3.5 - 5.1 mmol/L Final   Chloride 11/08/2020 103  98 - 111 mmol/L Final   CO2 11/08/2020 26  22 - 32 mmol/L Final   Glucose, Bld 11/08/2020 130 (A) 70 - 99 mg/dL Final   Glucose reference range applies only to samples taken after fasting for at least 8 hours.   BUN 11/08/2020 25 (A) 8 - 23 mg/dL Final   Creatinine, Ser 11/08/2020 1.20 (A) 0.44 - 1.00 mg/dL Final   Calcium 11/08/2020 8.6 (A) 8.9 - 10.3 mg/dL Final   Total Protein 11/08/2020 6.6  6.5 - 8.1 g/dL Final   Albumin 11/08/2020 3.6  3.5 - 5.0 g/dL Final   AST 11/08/2020 33  15 - 41 U/L Final   ALT 11/08/2020 22  0 - 44 U/L Final   Alkaline Phosphatase 11/08/2020 46  38 - 126 U/L Final   Total Bilirubin 11/08/2020 0.4  0.3 - 1.2 mg/dL Final   GFR, Estimated 11/08/2020 48 (A) >60 mL/min Final   Comment: (NOTE) Calculated using the CKD-EPI Creatinine Equation (2021)    Anion gap 11/08/2020 10  5 - 15 Final   Performed at [facility], [place] [place], [place] [address]   WBC 11/08/2020 5.2  4.0 - 10.5 K/uL Final   RBC 11/08/2020 2.89 (A) 3.87 - 5.11 MIL/uL Final   Hemoglobin 11/08/2020 10.6 (A) 12.0 - 15.0 g/dL Final   HCT  11/08/2020 33.3 (A) 36.0 - 46.0 % Final   MCV 11/08/2020 115.2 (A) 80.0 - 100.0 fL Final   MCH 11/08/2020 36.7 (A) 26.0 - 34.0 pg Final   MCHC 11/08/2020 31.8  30.0 - 36.0 g/dL Final   RDW 11/08/2020 15.6 (A) 11.5 - 15.5 % Final   Platelets 11/08/2020 136 (A) 150 - 400 K/uL Final   nRBC 11/08/2020 0.0  0.0 - 0.2 % Final   Neutrophils Relative % 11/08/2020 47  % Final   Neutro Abs 11/08/2020 2.5  1.7 - 7.7 K/uL Final  Lymphocytes Relative 11/08/2020 41  % Final   Lymphs Abs 11/08/2020 2.1  0.7 - 4.0 K/uL Final   Monocytes Relative 11/08/2020 11  % Final   Monocytes Absolute 11/08/2020 0.6  0.1 - 1.0 K/uL Final   Eosinophils Relative 11/08/2020 0  % Final   Eosinophils Absolute 11/08/2020 0.0  0.0 - 0.5 K/uL Final   Basophils Relative 11/08/2020 0  % Final   Basophils Absolute 11/08/2020 0.0  0.0 - 0.1 K/uL Final   WBC Morphology 11/08/2020 MORPHOLOGY UNREMARKABLE   Final   RBC Morphology 11/08/2020 MORPHOLOGY UNREMARKABLE   Final   Smear Review 11/08/2020 Normal platelet morphology   Final   Immature Granulocytes 11/08/2020 1  % Final   Abs Immature Granulocytes 11/08/2020 0.03  0.00 - 0.07 K/uL Final   Performed at [facility], [place] [place], [place] [address]   Troponin I (High Sensitivity) 11/08/2020 10  <18 ng/L Final   Comment: (NOTE) Elevated high sensitivity troponin I (hsTnI) values and significant  changes across serial measurements may suggest ACS but many other  chronic and acute conditions are known to elevate hsTnI results.  Refer to the "Links" section for chest pain algorithms and additional  guidance. Performed at [facility], [address], [place], [place] [address]      X-Rays:CT Chest High Resolution  Result Date: 12/04/2020 CLINICAL DATA:  70 year old female with history of COVID infection diagnosed Nov 08, 2020. Evaluate for pulmonary fibrosis. Additional history of non-Hodgkin's lymphoma previously treated with chemotherapy.  EXAM: CT CHEST WITHOUT CONTRAST TECHNIQUE: Multidetector CT imaging of the chest was performed following the standard protocol without intravenous contrast. High resolution imaging of the lungs, as well as inspiratory and expiratory imaging, was performed. COMPARISON:  Cardiac CT 10/21/2020. FINDINGS: Cardiovascular: Heart size is normal. There is no significant pericardial fluid, thickening or pericardial calcification. Aortic atherosclerosis. No definite coronary artery calcifications. Mediastinum/Nodes: No pathologically enlarged mediastinal or hilar lymph nodes. Please note that accurate exclusion of hilar adenopathy is limited on noncontrast CT scans. Esophagus is unremarkable in appearance. No axillary lymphadenopathy. Lungs/Pleura: High-resolution images demonstrate patchy areas of peribronchovascular predominant ground-glass attenuation, mild septal thickening and thickening of the peribronchovascular interstitium in the lungs bilaterally, most evident throughout the mid to lower lungs. No traction bronchiectasis or honeycombing. Inspiratory and expiratory imaging demonstrates some very mild air trapping indicative of mild small airways disease. No acute consolidative airspace disease. No pleural effusions. No definite suspicious appearing pulmonary nodules or masses are noted. Upper Abdomen: Unremarkable. Musculoskeletal: There are no aggressive appearing lytic or blastic lesions noted in the visualized portions of the skeleton. IMPRESSION: 1. The appearance of the lungs does suggest interstitial lung disease, with a spectrum of findings considered indeterminate for usual interstitial pneumonia (UIP) per current ATS guidelines. Repeat high-resolution chest CT is recommended in 12 months to assess for temporal changes in the appearance of the lung parenchyma. 2. Aortic atherosclerosis. Aortic Atherosclerosis (ICD10-I70.0). Electronically Signed   By: Vinnie Langton M.D.   On: 12/04/2020 07:48     EKG: Orders placed or performed during the hospital encounter of 11/08/20   EKG 12-Lead   EKG 12-Lead   ED EKG   ED EKG     Hospital Course: Arryn Terrones is a 70 y.o. who was admitted to [facility]. They were brought to the operating room on 12/12/2020 and underwent Procedure(s): TOTAL KNEE REVISION.  Patient tolerated the procedure well and was later transferred to the  recovery room and then to the orthopaedic floor for postoperative care. They were given PO and IV analgesics for pain control following their surgery. They were given 24 hours of postoperative antibiotics of  Anti-infectives (From admission, onward)    Start     Dose/Rate Route Frequency Ordered Stop   12/12/20 1400  ceFAZolin (ANCEF) IVPB 2g/100 mL premix        2 g 200 mL/hr over 30 Minutes Intravenous Every 6 hours 12/12/20 1214 12/12/20 2003   12/12/20 0600  ceFAZolin (ANCEF) IVPB 2g/100 mL premix        2 g 200 mL/hr over 30 Minutes Intravenous On call to O.R. 12/12/20 0518 12/12/20 0748      and started on DVT prophylaxis in the form of Aspirin.   PT and OT were ordered for total joint protocol. Discharge planning consulted to help with postop disposition and equipment needs. Patient had a good night on the evening of surgery. They started to get up OOB with therapy on POD #0. Patient was having difficulty with pain control on POD #1. Continued to work with PT into POD #2, but continued with slow progression. Weight bearing status changed to WBAT to assist with safe mobility. Continued to work with PT on POD #3 and POD #4 on safe mobility. Continued to work with therapy into POD #5. SNF was recommended due to limited progression with mobility secondary to pain and underlying functional issues. Pt was seen during rounds on day two and was ready to go to SNF pending progress with therapy. Pt worked with therapy for two additional sessions and was meeting their goals. She was discharged to SNF later that day  in stable condition.  Diet: Regular diet Activity: WBAT Follow-up: in 2 weeks Disposition: Home Discharged Condition: good   Discharge Instructions     Call MD / Call 911   Complete by: As directed    If you experience chest pain or shortness of breath, CALL 911 and be transported to the hospital emergency room.  If you develope a fever above 101 F, pus (white drainage) or increased drainage or redness at the wound, or calf pain, call your surgeon's office.   Call MD / Call 911   Complete by: As directed    If you experience chest pain or shortness of breath, CALL 911 and be transported to the hospital emergency room.  If you develope a fever above 101 F, pus (white drainage) or increased drainage or redness at the wound, or calf pain, call your surgeon's office.   Change dressing   Complete by: As directed    Maintain surgical dressing until follow up in the clinic. If the edges start to pull up, may reinforce with tape. If the dressing is no longer working, may remove and cover with gauze and tape, but must keep the area dry and clean.  Call with any questions or concerns.   Change dressing   Complete by: As directed    Maintain surgical dressing until follow up in the clinic. If the edges start to pull up, may reinforce with tape. If the dressing is no longer working, may remove and cover with gauze and tape, but must keep the area dry and clean.  Call with any questions or concerns.   Constipation Prevention   Complete by: As directed    Drink plenty of fluids.  Prune juice may be helpful.  You may use a stool softener, such as Colace (over the counter) 100  mg twice a day.  Use MiraLax (over the counter) for constipation as needed.   Constipation Prevention   Complete by: As directed    Drink plenty of fluids.  Prune juice may be helpful.  You may use a stool softener, such as Colace (over the counter) 100 mg twice a day.  Use MiraLax (over the counter) for constipation as needed.    Diet - low sodium heart healthy   Complete by: As directed    Diet - low sodium heart healthy   Complete by: As directed    Increase activity slowly as tolerated   Complete by: As directed    Weight bearing as tolerated with assist device (walker, cane, etc) as directed, use it as long as suggested by your surgeon or therapist, typically at least 4-6 weeks.   Increase activity slowly as tolerated   Complete by: As directed    Weight bearing as tolerated with assist device (walker, cane, etc) as directed, use it as long as suggested by your surgeon or therapist, typically at least 4-6 weeks.   Post-operative opioid taper instructions:   Complete by: As directed    POST-OPERATIVE OPIOID TAPER INSTRUCTIONS: It is important to wean off of your opioid medication as soon as possible. If you do not need pain medication after your surgery it is ok to stop day one. Opioids include: Codeine, Hydrocodone(Norco, Vicodin), Oxycodone(Percocet, oxycontin) and hydromorphone amongst others.  Long term and even short term use of opiods can cause: Increased pain response Dependence Constipation Depression Respiratory depression And more.  Withdrawal symptoms can include Flu like symptoms Nausea, vomiting And more Techniques to manage these symptoms Hydrate well Eat regular healthy meals Stay active Use relaxation techniques(deep breathing, meditating, yoga) Do Not substitute Alcohol to help with tapering If you have been on opioids for less than two weeks and do not have pain than it is ok to stop all together.  Plan to wean off of opioids This plan should start within one week post op of your joint replacement. Maintain the same interval or time between taking each dose and first decrease the dose.  Cut the total daily intake of opioids by one tablet each day Next start to increase the time between doses. The last dose that should be eliminated is the evening dose.      Post-operative opioid  taper instructions:   Complete by: As directed    POST-OPERATIVE OPIOID TAPER INSTRUCTIONS: It is important to wean off of your opioid medication as soon as possible. If you do not need pain medication after your surgery it is ok to stop day one. Opioids include: Codeine, Hydrocodone(Norco, Vicodin), Oxycodone(Percocet, oxycontin) and hydromorphone amongst others.  Long term and even short term use of opiods can cause: Increased pain response Dependence Constipation Depression Respiratory depression And more.  Withdrawal symptoms can include Flu like symptoms Nausea, vomiting And more Techniques to manage these symptoms Hydrate well Eat regular healthy meals Stay active Use relaxation techniques(deep breathing, meditating, yoga) Do Not substitute Alcohol to help with tapering If you have been on opioids for less than two weeks and do not have pain than it is ok to stop all together.  Plan to wean off of opioids This plan should start within one week post op of your joint replacement. Maintain the same interval or time between taking each dose and first decrease the dose.  Cut the total daily intake of opioids by one tablet each day Next start to increase the  time between doses. The last dose that should be eliminated is the evening dose.      TED hose   Complete by: As directed    Use stockings (TED hose) for 2 weeks on both leg(s).  You may remove them at night for sleeping.   TED hose   Complete by: As directed    Use stockings (TED hose) for 2 weeks on both leg(s).  You may remove them at night for sleeping.      Allergies as of 12/17/2020       Reactions   Oxycodone Nausea And Vomiting   Able tolerate with antinausea medicine   Tramadol Other (See Comments)   UNKNOWN REACTION        Medication List     STOP taking these medications    HYDROcodone-acetaminophen 10-325 MG tablet Commonly known as: NORCO   leflunomide 20 MG tablet Commonly known as:  ARAVA   Orencia 250 MG injection Generic drug: abatacept       TAKE these medications    acetaminophen 500 MG tablet Commonly known as: TYLENOL Take 2 tablets (1,000 mg total) by mouth every 8 (eight) hours.   aspirin 81 MG chewable tablet Chew 1 tablet (81 mg total) by mouth 2 (two) times daily for 28 days.   docusate sodium 100 MG capsule Commonly known as: COLACE Take 1 capsule (100 mg total) by mouth 2 (two) times daily.   estradiol 1 MG tablet Commonly known as: ESTRACE Take 1 mg by mouth in the morning.   folic acid 1 MG tablet Commonly known as: FOLVITE Take 2 mg by mouth in the morning.   furosemide 40 MG tablet Commonly known as: LASIX Take 40 mg by mouth in the morning.   gabapentin 300 MG capsule Commonly known as: NEURONTIN Take 300-600 mg by mouth See admin instructions. Take 1 capsule (300 mg) by mouth in the morning, take 1 capsule (300 mg) by mouth in the afternoon & take 2 capsules (600 mg) by mouth at night.   methocarbamol 500 MG tablet Commonly known as: ROBAXIN Take 1 tablet (500 mg total) by mouth every 6 (six) hours as needed for muscle spasms. What changed:  when to take this reasons to take this   multivitamin with minerals Tabs tablet Take 1 tablet by mouth in the morning.   olmesartan 20 MG tablet Commonly known as: BENICAR Take 20 mg by mouth in the morning.   ondansetron 4 MG tablet Commonly known as: ZOFRAN Take 4 mg by mouth every 8 (eight) hours as needed for nausea or vomiting.   Oxycodone HCl 10 MG Tabs Take 1 tablet (10 mg total) by mouth every 4 (four) hours as needed for severe pain (pain score 7-10).   pantoprazole 40 MG tablet Commonly known as: PROTONIX Take 40 mg by mouth in the morning.   polyethylene glycol 17 g packet Commonly known as: MIRALAX / GLYCOLAX Take 17 g by mouth daily as needed for mild constipation.   Potassium Chloride ER 20 MEQ Tbcr Take 20 mEq by mouth in the morning.   predniSONE 10 MG  tablet Commonly known as: DELTASONE Take 10 mg by mouth daily with breakfast.   vitamin B-12 1000 MCG tablet Commonly known as: CYANOCOBALAMIN Take 1,000 mcg by mouth daily.   vitamin C 1000 MG tablet Take 1,000 mg by mouth daily.   Vitamin D3 50 MCG (2000 UT) Tabs Take 2,000 Units by mouth in the morning.  Discharge Care Instructions  (From admission, onward)           Start     Ordered   12/17/20 0000  Change dressing       Comments: Maintain surgical dressing until follow up in the clinic. If the edges start to pull up, may reinforce with tape. If the dressing is no longer working, may remove and cover with gauze and tape, but must keep the area dry and clean.  Call with any questions or concerns.   12/17/20 1049   12/17/20 0000  Change dressing       Comments: Maintain surgical dressing until follow up in the clinic. If the edges start to pull up, may reinforce with tape. If the dressing is no longer working, may remove and cover with gauze and tape, but must keep the area dry and clean.  Call with any questions or concerns.   12/17/20 1049            Follow-up Information     Paralee Cancel, MD. Schedule an appointment as soon as possible for a visit in 2 week(s).   Specialty: Orthopedic Surgery Contact information: [address] [place] [address] [phone]         Health, [facility] Follow up.   Specialty: [place] Why: PT, OT                Signed: Griffith Citron, PA-C Orthopedic Surgery 12/17/2020, 10:50 AM

## 2020-12-17 NOTE — TOC Transition Note (Signed)
Transition of Care [facility]) - CM/SW Discharge Note   Patient Details  Name: Helen Hardy MRN: [id number] Date of Birth: [birth year]  Transition of Care [facility]) CM/SW Contact:  Lennart Pall, LCSW Phone Number: 12/17/2020, 11:57 AM   Clinical Narrative:     Pt has accepted SNF bed at [facility].  Have notified pt and spouse that [facility] will transport (called at 12:10pm).  RN to call report to [phone].  No further TOC needs.  Final next level of care: Skilled Nursing Facility Barriers to Discharge: Barriers Resolved   Patient Goals and CMS Choice Patient states their goals for this hospitalization and ongoing recovery are:: Return home CMS Medicare.gov Compare Post Acute Care list provided to:: Patient Choice offered to / list presented to : Patient  Discharge Placement   Existing PASRR number confirmed : 12/16/20          Patient chooses bed at: [facility] Patient to be transferred to facility by: [place] Name of family member notified: left VM for spouse Patient and family notified of of transfer: 12/17/20  Discharge Plan and Services In-house Referral: Clinical Social Work   Post Acute Care Choice: Museum/gallery conservator, Home Health          DME Arranged: N/A DME Agency: NA     Representative spoke with at DME Agency: Pre-arranged in orthopedist's office HH Arranged: NA [place] Agency: NA Date [place]: 12/15/20 Time [place]: 34 Representative spoke with at [place]: [place] ([place]) Interventions     Readmission Risk Interventions Readmission Risk Prevention Plan 12/13/2020  Transportation Screening Complete  Home Care Screening Complete  Medication Review (RN CM) Complete  Some recent data might be hidden

## 2020-12-17 NOTE — Progress Notes (Signed)
I have reviewed and concur with students documentation.

## 2020-12-17 NOTE — Progress Notes (Signed)
Physical Therapy Treatment Patient Details Name: Helen Hardy MRN: [id number] DOB: 13-Apr-1951 Today's Date: 12/17/2020    History of Present Illness Pt is a 70 y.o. female  s/p R TKR revision on 12/12/2020. Pt  with hx of bil TKR, RA, non-hodgkins lymphoma and back surgery.  12/15/20 - Dr Alvan Dame increased to [facility]    PT Comments    POD #5 am session Assisted OOB to [facility] then to recliner required increased time.  Pt plans to D/C to SNF today.  Follow Up Recommendations  SNF     Equipment Recommendations       Recommendations for Other Services       Precautions / Restrictions Precautions Precautions: Fall;Knee Precaution Comments: Limit R knee flexion to 40* Restrictions Weight Bearing Restrictions: No RLE Weight Bearing: Weight bearing as tolerated Other Position/Activity Restrictions: Pt increased to WBAT by Dr Alvan Dame 12/15/20    Mobility  Bed Mobility Overal bed mobility: Needs Assistance Bed Mobility: Supine to Sit     Supine to sit: Max assist     General bed mobility comments: pt required MAX Assist to transition to EOB using bed pad to complete scooting as pt was unable to self perform past 15%    Transfers Overall transfer level: Needs assistance Equipment used: Rolling walker (2 wheeled) Transfers: Sit to/from Stand Sit to Stand: Mod assist;+2 physical assistance Stand pivot transfers: Mod assist;+2 safety/equipment       General transfer comment: VCs hand placement, mod A to power up from EOB to [facility] then up to amb.  Ambulation/Gait Ambulation/Gait assistance: Min assist Gait Distance (Feet): 2 Feet Assistive device: Rolling walker (2 wheeled) Gait Pattern/deviations: Step-to pattern;Decreased step length - right;Decreased step length - left;Shuffle;Decreased stance time - right;Trunk flexed Gait velocity: decreased   General Gait Details: decreased amb distance due to fatigue after BSC transfer.   Stairs             Wheelchair Mobility     Modified Rankin (Stroke Patients Only)       Balance                                            Cognition Arousal/Alertness: Awake/alert Behavior During Therapy: WFL for tasks assessed/performed Overall Cognitive Status: Within Functional Limits for tasks assessed                                 General Comments: AxO x 3 pleasant      Exercises      General Comments        Pertinent Vitals/Pain Pain Assessment: 0-10 Pain Score: 8  Pain Location: R knee Pain Descriptors / Indicators: Aching;Sore;Operative site guarding Pain Intervention(s): Monitored during session;Premedicated before session;Repositioned;Ice applied    Home Living                      Prior Function            PT Goals (current goals can now be found in the care plan section) Progress towards PT goals: Progressing toward goals    Frequency    7X/week      PT Plan Current plan remains appropriate    Co-evaluation              AM-PAC PT "6 Clicks" Mobility   Outcome  Measure  Help needed turning from your back to your side while in a flat bed without using bedrails?: A Lot Help needed moving from lying on your back to sitting on the side of a flat bed without using bedrails?: A Lot Help needed moving to and from a bed to a chair (including a wheelchair)?: A Lot Help needed standing up from a chair using your arms (e.g., wheelchair or bedside chair)?: A Lot Help needed to walk in hospital room?: A Lot Help needed climbing 3-5 steps with a railing? : Total 6 Click Score: 11    End of Session Equipment Utilized During Treatment: Gait belt Activity Tolerance: Patient limited by fatigue Patient left: in chair;with call bell/phone within reach;with chair alarm set Nurse Communication: Mobility status PT Visit Diagnosis: Difficulty in walking, not elsewhere classified (R26.2);Muscle weakness (generalized) (M62.81);Pain Pain - Right/Left:  Right Pain - part of body: Knee     Time: 1000-1025 PT Time Calculation (min) (ACUTE ONLY): 25 min  Charges:  $Gait Training: 8-22 mins $Therapeutic Activity: 8-22 mins                     Rica Koyanagi  PTA Acute  Rehabilitation Services Pager      [phone] Office      [phone]

## 2020-12-17 NOTE — Progress Notes (Signed)
Report called to nurse Tamala Julian at [facility].

## 2020-12-19 DIAGNOSIS — I150 Renovascular hypertension: Secondary | ICD-10-CM | POA: Diagnosis not present

## 2020-12-19 DIAGNOSIS — E8881 Metabolic syndrome: Secondary | ICD-10-CM | POA: Diagnosis not present

## 2020-12-19 DIAGNOSIS — Z96652 Presence of left artificial knee joint: Secondary | ICD-10-CM | POA: Diagnosis not present

## 2020-12-19 DIAGNOSIS — M519 Unspecified thoracic, thoracolumbar and lumbosacral intervertebral disc disorder: Secondary | ICD-10-CM | POA: Diagnosis not present

## 2020-12-19 DIAGNOSIS — R262 Difficulty in walking, not elsewhere classified: Secondary | ICD-10-CM | POA: Diagnosis not present

## 2020-12-19 DIAGNOSIS — Z96651 Presence of right artificial knee joint: Secondary | ICD-10-CM | POA: Diagnosis not present

## 2020-12-19 DIAGNOSIS — D649 Anemia, unspecified: Secondary | ICD-10-CM | POA: Diagnosis not present

## 2020-12-19 DIAGNOSIS — N183 Chronic kidney disease, stage 3 unspecified: Secondary | ICD-10-CM | POA: Diagnosis not present

## 2020-12-19 DIAGNOSIS — I10 Essential (primary) hypertension: Secondary | ICD-10-CM | POA: Diagnosis not present

## 2020-12-19 DIAGNOSIS — K219 Gastro-esophageal reflux disease without esophagitis: Secondary | ICD-10-CM | POA: Diagnosis not present

## 2020-12-19 DIAGNOSIS — C8590 Non-Hodgkin lymphoma, unspecified, unspecified site: Secondary | ICD-10-CM | POA: Diagnosis not present

## 2020-12-19 DIAGNOSIS — T84018A Broken internal joint prosthesis, other site, initial encounter: Secondary | ICD-10-CM | POA: Diagnosis not present

## 2020-12-19 DIAGNOSIS — Z96659 Presence of unspecified artificial knee joint: Secondary | ICD-10-CM | POA: Diagnosis not present

## 2020-12-19 DIAGNOSIS — M0689 Other specified rheumatoid arthritis, multiple sites: Secondary | ICD-10-CM | POA: Diagnosis not present

## 2020-12-19 DIAGNOSIS — M6281 Muscle weakness (generalized): Secondary | ICD-10-CM | POA: Diagnosis not present

## 2020-12-19 DIAGNOSIS — D538 Other specified nutritional anemias: Secondary | ICD-10-CM | POA: Diagnosis not present

## 2020-12-19 DIAGNOSIS — M069 Rheumatoid arthritis, unspecified: Secondary | ICD-10-CM | POA: Diagnosis not present

## 2020-12-19 DIAGNOSIS — R2681 Unsteadiness on feet: Secondary | ICD-10-CM | POA: Diagnosis not present

## 2020-12-19 DIAGNOSIS — M170 Bilateral primary osteoarthritis of knee: Secondary | ICD-10-CM | POA: Diagnosis not present

## 2020-12-19 DIAGNOSIS — Z79890 Hormone replacement therapy (postmenopausal): Secondary | ICD-10-CM | POA: Diagnosis not present

## 2020-12-19 DIAGNOSIS — G8929 Other chronic pain: Secondary | ICD-10-CM | POA: Diagnosis not present

## 2020-12-20 ENCOUNTER — Other Ambulatory Visit: Payer: Self-pay | Admitting: *Deleted

## 2020-12-20 NOTE — Patient Outreach (Signed)
Member screened for potential [facility] Care Management needs. Mrs. Porter-Jones resides in [facility].   Update received from [facility] SNF reporting Mrs. Porter-Jones transition plan is to return home. She has supportive spouse and children.   Will continue to follow for potential [facility] Care Management services. Will plan outreach as appropriate.     Marthenia Rolling, MSN, RN,BSN [place] Acute Care Coordinator [phone] [facility]) [phone]  (Toll free office)

## 2020-12-24 DIAGNOSIS — Z96659 Presence of unspecified artificial knee joint: Secondary | ICD-10-CM | POA: Diagnosis not present

## 2020-12-24 DIAGNOSIS — I150 Renovascular hypertension: Secondary | ICD-10-CM | POA: Diagnosis not present

## 2020-12-24 DIAGNOSIS — Z96652 Presence of left artificial knee joint: Secondary | ICD-10-CM | POA: Diagnosis not present

## 2020-12-24 DIAGNOSIS — D538 Other specified nutritional anemias: Secondary | ICD-10-CM | POA: Diagnosis not present

## 2020-12-24 DIAGNOSIS — N1831 Chronic kidney disease, stage 3a: Secondary | ICD-10-CM | POA: Diagnosis not present

## 2020-12-24 DIAGNOSIS — I10 Essential (primary) hypertension: Secondary | ICD-10-CM | POA: Diagnosis not present

## 2020-12-24 DIAGNOSIS — M6281 Muscle weakness (generalized): Secondary | ICD-10-CM | POA: Diagnosis not present

## 2020-12-24 DIAGNOSIS — K219 Gastro-esophageal reflux disease without esophagitis: Secondary | ICD-10-CM | POA: Diagnosis not present

## 2020-12-24 DIAGNOSIS — R2681 Unsteadiness on feet: Secondary | ICD-10-CM | POA: Diagnosis not present

## 2020-12-24 DIAGNOSIS — M069 Rheumatoid arthritis, unspecified: Secondary | ICD-10-CM | POA: Diagnosis not present

## 2020-12-24 DIAGNOSIS — M519 Unspecified thoracic, thoracolumbar and lumbosacral intervertebral disc disorder: Secondary | ICD-10-CM | POA: Diagnosis not present

## 2020-12-24 DIAGNOSIS — G8929 Other chronic pain: Secondary | ICD-10-CM | POA: Diagnosis not present

## 2020-12-24 DIAGNOSIS — Z96651 Presence of right artificial knee joint: Secondary | ICD-10-CM | POA: Diagnosis not present

## 2020-12-24 DIAGNOSIS — D649 Anemia, unspecified: Secondary | ICD-10-CM | POA: Diagnosis not present

## 2020-12-24 DIAGNOSIS — R262 Difficulty in walking, not elsewhere classified: Secondary | ICD-10-CM | POA: Diagnosis not present

## 2020-12-24 DIAGNOSIS — Z1331 Encounter for screening for depression: Secondary | ICD-10-CM | POA: Diagnosis not present

## 2020-12-24 DIAGNOSIS — R42 Dizziness and giddiness: Secondary | ICD-10-CM | POA: Diagnosis not present

## 2020-12-25 DIAGNOSIS — Z96651 Presence of right artificial knee joint: Secondary | ICD-10-CM | POA: Diagnosis not present

## 2020-12-25 DIAGNOSIS — Z471 Aftercare following joint replacement surgery: Secondary | ICD-10-CM | POA: Diagnosis not present

## 2020-12-26 DIAGNOSIS — Z96659 Presence of unspecified artificial knee joint: Secondary | ICD-10-CM | POA: Diagnosis not present

## 2020-12-26 DIAGNOSIS — Z9189 Other specified personal risk factors, not elsewhere classified: Secondary | ICD-10-CM | POA: Diagnosis not present

## 2020-12-26 DIAGNOSIS — I150 Renovascular hypertension: Secondary | ICD-10-CM | POA: Diagnosis not present

## 2020-12-26 DIAGNOSIS — N178 Other acute kidney failure: Secondary | ICD-10-CM | POA: Diagnosis not present

## 2020-12-30 DIAGNOSIS — M6281 Muscle weakness (generalized): Secondary | ICD-10-CM | POA: Diagnosis not present

## 2020-12-30 DIAGNOSIS — D649 Anemia, unspecified: Secondary | ICD-10-CM | POA: Diagnosis not present

## 2020-12-30 DIAGNOSIS — R2681 Unsteadiness on feet: Secondary | ICD-10-CM | POA: Diagnosis not present

## 2020-12-30 DIAGNOSIS — I10 Essential (primary) hypertension: Secondary | ICD-10-CM | POA: Diagnosis not present

## 2020-12-30 DIAGNOSIS — M519 Unspecified thoracic, thoracolumbar and lumbosacral intervertebral disc disorder: Secondary | ICD-10-CM | POA: Diagnosis not present

## 2020-12-30 DIAGNOSIS — G8929 Other chronic pain: Secondary | ICD-10-CM | POA: Diagnosis not present

## 2020-12-30 DIAGNOSIS — K219 Gastro-esophageal reflux disease without esophagitis: Secondary | ICD-10-CM | POA: Diagnosis not present

## 2020-12-30 DIAGNOSIS — M069 Rheumatoid arthritis, unspecified: Secondary | ICD-10-CM | POA: Diagnosis not present

## 2020-12-30 DIAGNOSIS — Z96652 Presence of left artificial knee joint: Secondary | ICD-10-CM | POA: Diagnosis not present

## 2020-12-30 DIAGNOSIS — N183 Chronic kidney disease, stage 3 unspecified: Secondary | ICD-10-CM | POA: Diagnosis not present

## 2020-12-30 DIAGNOSIS — Z96651 Presence of right artificial knee joint: Secondary | ICD-10-CM | POA: Diagnosis not present

## 2020-12-31 DIAGNOSIS — N178 Other acute kidney failure: Secondary | ICD-10-CM | POA: Diagnosis not present

## 2020-12-31 DIAGNOSIS — Z760 Encounter for issue of repeat prescription: Secondary | ICD-10-CM | POA: Diagnosis not present

## 2020-12-31 DIAGNOSIS — Z96659 Presence of unspecified artificial knee joint: Secondary | ICD-10-CM | POA: Diagnosis not present

## 2021-01-02 ENCOUNTER — Telehealth: Payer: Self-pay | Admitting: *Deleted

## 2021-01-02 DIAGNOSIS — M6281 Muscle weakness (generalized): Secondary | ICD-10-CM | POA: Diagnosis not present

## 2021-01-02 DIAGNOSIS — K219 Gastro-esophageal reflux disease without esophagitis: Secondary | ICD-10-CM | POA: Diagnosis not present

## 2021-01-02 DIAGNOSIS — I10 Essential (primary) hypertension: Secondary | ICD-10-CM | POA: Diagnosis not present

## 2021-01-02 DIAGNOSIS — N183 Chronic kidney disease, stage 3 unspecified: Secondary | ICD-10-CM | POA: Diagnosis not present

## 2021-01-02 DIAGNOSIS — M069 Rheumatoid arthritis, unspecified: Secondary | ICD-10-CM | POA: Diagnosis not present

## 2021-01-02 DIAGNOSIS — R2681 Unsteadiness on feet: Secondary | ICD-10-CM | POA: Diagnosis not present

## 2021-01-02 DIAGNOSIS — M519 Unspecified thoracic, thoracolumbar and lumbosacral intervertebral disc disorder: Secondary | ICD-10-CM | POA: Diagnosis not present

## 2021-01-02 DIAGNOSIS — Z96651 Presence of right artificial knee joint: Secondary | ICD-10-CM | POA: Diagnosis not present

## 2021-01-02 DIAGNOSIS — G8929 Other chronic pain: Secondary | ICD-10-CM | POA: Diagnosis not present

## 2021-01-02 DIAGNOSIS — Z96652 Presence of left artificial knee joint: Secondary | ICD-10-CM | POA: Diagnosis not present

## 2021-01-02 DIAGNOSIS — D649 Anemia, unspecified: Secondary | ICD-10-CM | POA: Diagnosis not present

## 2021-01-02 NOTE — Telephone Encounter (Signed)
Spoke to patient's husband about appoint being rescheduled from 01/03/21 to 01/17/21 confirmed - he stated that patient is now in rehab after having knee surgery.

## 2021-01-03 ENCOUNTER — Inpatient Hospital Stay: Payer: Medicare Other | Admitting: Family

## 2021-01-03 ENCOUNTER — Inpatient Hospital Stay: Payer: Medicare Other

## 2021-01-03 ENCOUNTER — Ambulatory Visit: Payer: Medicare Other

## 2021-01-06 DIAGNOSIS — N183 Chronic kidney disease, stage 3 unspecified: Secondary | ICD-10-CM | POA: Diagnosis not present

## 2021-01-06 DIAGNOSIS — I10 Essential (primary) hypertension: Secondary | ICD-10-CM | POA: Diagnosis not present

## 2021-01-06 DIAGNOSIS — M519 Unspecified thoracic, thoracolumbar and lumbosacral intervertebral disc disorder: Secondary | ICD-10-CM | POA: Diagnosis not present

## 2021-01-06 DIAGNOSIS — M069 Rheumatoid arthritis, unspecified: Secondary | ICD-10-CM | POA: Diagnosis not present

## 2021-01-06 DIAGNOSIS — G8929 Other chronic pain: Secondary | ICD-10-CM | POA: Diagnosis not present

## 2021-01-06 DIAGNOSIS — D649 Anemia, unspecified: Secondary | ICD-10-CM | POA: Diagnosis not present

## 2021-01-06 DIAGNOSIS — Z96652 Presence of left artificial knee joint: Secondary | ICD-10-CM | POA: Diagnosis not present

## 2021-01-06 DIAGNOSIS — R2681 Unsteadiness on feet: Secondary | ICD-10-CM | POA: Diagnosis not present

## 2021-01-06 DIAGNOSIS — M6281 Muscle weakness (generalized): Secondary | ICD-10-CM | POA: Diagnosis not present

## 2021-01-06 DIAGNOSIS — Z96651 Presence of right artificial knee joint: Secondary | ICD-10-CM | POA: Diagnosis not present

## 2021-01-06 DIAGNOSIS — K219 Gastro-esophageal reflux disease without esophagitis: Secondary | ICD-10-CM | POA: Diagnosis not present

## 2021-01-09 DIAGNOSIS — Z96652 Presence of left artificial knee joint: Secondary | ICD-10-CM | POA: Diagnosis not present

## 2021-01-09 DIAGNOSIS — R2681 Unsteadiness on feet: Secondary | ICD-10-CM | POA: Diagnosis not present

## 2021-01-09 DIAGNOSIS — G8929 Other chronic pain: Secondary | ICD-10-CM | POA: Diagnosis not present

## 2021-01-09 DIAGNOSIS — M6281 Muscle weakness (generalized): Secondary | ICD-10-CM | POA: Diagnosis not present

## 2021-01-09 DIAGNOSIS — M519 Unspecified thoracic, thoracolumbar and lumbosacral intervertebral disc disorder: Secondary | ICD-10-CM | POA: Diagnosis not present

## 2021-01-09 DIAGNOSIS — I10 Essential (primary) hypertension: Secondary | ICD-10-CM | POA: Diagnosis not present

## 2021-01-09 DIAGNOSIS — N183 Chronic kidney disease, stage 3 unspecified: Secondary | ICD-10-CM | POA: Diagnosis not present

## 2021-01-09 DIAGNOSIS — K219 Gastro-esophageal reflux disease without esophagitis: Secondary | ICD-10-CM | POA: Diagnosis not present

## 2021-01-09 DIAGNOSIS — M069 Rheumatoid arthritis, unspecified: Secondary | ICD-10-CM | POA: Diagnosis not present

## 2021-01-09 DIAGNOSIS — D649 Anemia, unspecified: Secondary | ICD-10-CM | POA: Diagnosis not present

## 2021-01-09 DIAGNOSIS — Z96651 Presence of right artificial knee joint: Secondary | ICD-10-CM | POA: Diagnosis not present

## 2021-01-14 DIAGNOSIS — E8881 Metabolic syndrome: Secondary | ICD-10-CM | POA: Diagnosis not present

## 2021-01-14 DIAGNOSIS — Z96651 Presence of right artificial knee joint: Secondary | ICD-10-CM | POA: Diagnosis not present

## 2021-01-14 DIAGNOSIS — C8590 Non-Hodgkin lymphoma, unspecified, unspecified site: Secondary | ICD-10-CM | POA: Diagnosis not present

## 2021-01-14 DIAGNOSIS — N183 Chronic kidney disease, stage 3 unspecified: Secondary | ICD-10-CM | POA: Diagnosis not present

## 2021-01-14 DIAGNOSIS — I150 Renovascular hypertension: Secondary | ICD-10-CM | POA: Diagnosis not present

## 2021-01-14 DIAGNOSIS — M0689 Other specified rheumatoid arthritis, multiple sites: Secondary | ICD-10-CM | POA: Diagnosis not present

## 2021-01-14 DIAGNOSIS — M170 Bilateral primary osteoarthritis of knee: Secondary | ICD-10-CM | POA: Diagnosis not present

## 2021-01-14 DIAGNOSIS — Z79890 Hormone replacement therapy (postmenopausal): Secondary | ICD-10-CM | POA: Diagnosis not present

## 2021-01-15 ENCOUNTER — Other Ambulatory Visit: Payer: Self-pay | Admitting: *Deleted

## 2021-01-15 DIAGNOSIS — Z7952 Long term (current) use of systemic steroids: Secondary | ICD-10-CM | POA: Diagnosis not present

## 2021-01-15 DIAGNOSIS — Z9221 Personal history of antineoplastic chemotherapy: Secondary | ICD-10-CM | POA: Diagnosis not present

## 2021-01-15 DIAGNOSIS — Z8701 Personal history of pneumonia (recurrent): Secondary | ICD-10-CM | POA: Diagnosis not present

## 2021-01-15 DIAGNOSIS — E611 Iron deficiency: Secondary | ICD-10-CM | POA: Diagnosis not present

## 2021-01-15 DIAGNOSIS — Z79891 Long term (current) use of opiate analgesic: Secondary | ICD-10-CM | POA: Diagnosis not present

## 2021-01-15 DIAGNOSIS — Z8616 Personal history of COVID-19: Secondary | ICD-10-CM | POA: Diagnosis not present

## 2021-01-15 DIAGNOSIS — T84092D Other mechanical complication of internal right knee prosthesis, subsequent encounter: Secondary | ICD-10-CM | POA: Diagnosis not present

## 2021-01-15 DIAGNOSIS — N183 Chronic kidney disease, stage 3 unspecified: Secondary | ICD-10-CM | POA: Diagnosis not present

## 2021-01-15 DIAGNOSIS — D631 Anemia in chronic kidney disease: Secondary | ICD-10-CM | POA: Diagnosis not present

## 2021-01-15 DIAGNOSIS — E8881 Metabolic syndrome: Secondary | ICD-10-CM | POA: Diagnosis not present

## 2021-01-15 DIAGNOSIS — I700 Atherosclerosis of aorta: Secondary | ICD-10-CM | POA: Diagnosis not present

## 2021-01-15 DIAGNOSIS — R202 Paresthesia of skin: Secondary | ICD-10-CM | POA: Diagnosis not present

## 2021-01-15 DIAGNOSIS — N179 Acute kidney failure, unspecified: Secondary | ICD-10-CM | POA: Diagnosis not present

## 2021-01-15 DIAGNOSIS — K219 Gastro-esophageal reflux disease without esophagitis: Secondary | ICD-10-CM | POA: Diagnosis not present

## 2021-01-15 DIAGNOSIS — C8590 Non-Hodgkin lymphoma, unspecified, unspecified site: Secondary | ICD-10-CM | POA: Diagnosis not present

## 2021-01-15 DIAGNOSIS — Z9181 History of falling: Secondary | ICD-10-CM | POA: Diagnosis not present

## 2021-01-15 DIAGNOSIS — T8484XD Pain due to internal orthopedic prosthetic devices, implants and grafts, subsequent encounter: Secondary | ICD-10-CM | POA: Diagnosis not present

## 2021-01-15 DIAGNOSIS — I129 Hypertensive chronic kidney disease with stage 1 through stage 4 chronic kidney disease, or unspecified chronic kidney disease: Secondary | ICD-10-CM | POA: Diagnosis not present

## 2021-01-15 DIAGNOSIS — M069 Rheumatoid arthritis, unspecified: Secondary | ICD-10-CM | POA: Diagnosis not present

## 2021-01-15 DIAGNOSIS — Z96652 Presence of left artificial knee joint: Secondary | ICD-10-CM | POA: Diagnosis not present

## 2021-01-15 NOTE — Patient Outreach (Signed)
[place] Coordinator follow up. Member screened for potential [facility] Care Management needs.   Verified in [place] (Patient Helen Hardy) that Mrs. Porter-Jones transitioned from [facility] on 01/14/21.   Telephone call made to Mrs Cuthrell [phone]. Patient identifiers confirmed. Mrs. Porter-Jones reports she is doing well since being home. Reports [facility] has a visit scheduled today at 1 pm. States she contacted PCP office and was told she did not need follow up appointment since she just recently had one. Lives with spouse  Explained [facility] Care Management program. Member denies having any care coordination or chronic case management needs at this time.    Member expressed appreciation of call. Will sign off.    Marthenia Rolling, MSN, RN,BSN [place] Acute Care Coordinator [phone] [facility]) [phone]  (Toll free office)

## 2021-01-16 DIAGNOSIS — T8484XD Pain due to internal orthopedic prosthetic devices, implants and grafts, subsequent encounter: Secondary | ICD-10-CM | POA: Diagnosis not present

## 2021-01-16 DIAGNOSIS — N183 Chronic kidney disease, stage 3 unspecified: Secondary | ICD-10-CM | POA: Diagnosis not present

## 2021-01-16 DIAGNOSIS — D631 Anemia in chronic kidney disease: Secondary | ICD-10-CM | POA: Diagnosis not present

## 2021-01-16 DIAGNOSIS — N179 Acute kidney failure, unspecified: Secondary | ICD-10-CM | POA: Diagnosis not present

## 2021-01-16 DIAGNOSIS — I129 Hypertensive chronic kidney disease with stage 1 through stage 4 chronic kidney disease, or unspecified chronic kidney disease: Secondary | ICD-10-CM | POA: Diagnosis not present

## 2021-01-16 DIAGNOSIS — T84092D Other mechanical complication of internal right knee prosthesis, subsequent encounter: Secondary | ICD-10-CM | POA: Diagnosis not present

## 2021-01-17 ENCOUNTER — Ambulatory Visit: Payer: Medicare Other | Admitting: Family

## 2021-01-17 ENCOUNTER — Ambulatory Visit: Payer: Medicare Other

## 2021-01-17 ENCOUNTER — Other Ambulatory Visit: Payer: Medicare Other

## 2021-01-17 ENCOUNTER — Telehealth: Payer: Self-pay

## 2021-01-17 DIAGNOSIS — I10 Essential (primary) hypertension: Secondary | ICD-10-CM | POA: Diagnosis not present

## 2021-01-17 DIAGNOSIS — K219 Gastro-esophageal reflux disease without esophagitis: Secondary | ICD-10-CM | POA: Diagnosis not present

## 2021-01-17 DIAGNOSIS — R262 Difficulty in walking, not elsewhere classified: Secondary | ICD-10-CM | POA: Diagnosis not present

## 2021-01-17 DIAGNOSIS — M069 Rheumatoid arthritis, unspecified: Secondary | ICD-10-CM | POA: Diagnosis not present

## 2021-01-17 NOTE — Telephone Encounter (Signed)
Called pt to r/s her appt as she had a knee replacement   Helen Hardy

## 2021-01-21 DIAGNOSIS — D631 Anemia in chronic kidney disease: Secondary | ICD-10-CM | POA: Diagnosis not present

## 2021-01-21 DIAGNOSIS — T84092D Other mechanical complication of internal right knee prosthesis, subsequent encounter: Secondary | ICD-10-CM | POA: Diagnosis not present

## 2021-01-21 DIAGNOSIS — N183 Chronic kidney disease, stage 3 unspecified: Secondary | ICD-10-CM | POA: Diagnosis not present

## 2021-01-21 DIAGNOSIS — I129 Hypertensive chronic kidney disease with stage 1 through stage 4 chronic kidney disease, or unspecified chronic kidney disease: Secondary | ICD-10-CM | POA: Diagnosis not present

## 2021-01-21 DIAGNOSIS — T8484XD Pain due to internal orthopedic prosthetic devices, implants and grafts, subsequent encounter: Secondary | ICD-10-CM | POA: Diagnosis not present

## 2021-01-21 DIAGNOSIS — N179 Acute kidney failure, unspecified: Secondary | ICD-10-CM | POA: Diagnosis not present

## 2021-01-24 DIAGNOSIS — N183 Chronic kidney disease, stage 3 unspecified: Secondary | ICD-10-CM | POA: Diagnosis not present

## 2021-01-24 DIAGNOSIS — I129 Hypertensive chronic kidney disease with stage 1 through stage 4 chronic kidney disease, or unspecified chronic kidney disease: Secondary | ICD-10-CM | POA: Diagnosis not present

## 2021-01-24 DIAGNOSIS — T8484XD Pain due to internal orthopedic prosthetic devices, implants and grafts, subsequent encounter: Secondary | ICD-10-CM | POA: Diagnosis not present

## 2021-01-24 DIAGNOSIS — T84092D Other mechanical complication of internal right knee prosthesis, subsequent encounter: Secondary | ICD-10-CM | POA: Diagnosis not present

## 2021-01-24 DIAGNOSIS — D631 Anemia in chronic kidney disease: Secondary | ICD-10-CM | POA: Diagnosis not present

## 2021-01-24 DIAGNOSIS — N179 Acute kidney failure, unspecified: Secondary | ICD-10-CM | POA: Diagnosis not present

## 2021-01-28 DIAGNOSIS — N183 Chronic kidney disease, stage 3 unspecified: Secondary | ICD-10-CM | POA: Diagnosis not present

## 2021-01-28 DIAGNOSIS — I129 Hypertensive chronic kidney disease with stage 1 through stage 4 chronic kidney disease, or unspecified chronic kidney disease: Secondary | ICD-10-CM | POA: Diagnosis not present

## 2021-01-28 DIAGNOSIS — T84092D Other mechanical complication of internal right knee prosthesis, subsequent encounter: Secondary | ICD-10-CM | POA: Diagnosis not present

## 2021-01-28 DIAGNOSIS — D631 Anemia in chronic kidney disease: Secondary | ICD-10-CM | POA: Diagnosis not present

## 2021-01-28 DIAGNOSIS — T8484XD Pain due to internal orthopedic prosthetic devices, implants and grafts, subsequent encounter: Secondary | ICD-10-CM | POA: Diagnosis not present

## 2021-01-28 DIAGNOSIS — N179 Acute kidney failure, unspecified: Secondary | ICD-10-CM | POA: Diagnosis not present

## 2021-01-29 DIAGNOSIS — Z471 Aftercare following joint replacement surgery: Secondary | ICD-10-CM | POA: Diagnosis not present

## 2021-01-29 DIAGNOSIS — Z96651 Presence of right artificial knee joint: Secondary | ICD-10-CM | POA: Diagnosis not present

## 2021-01-31 DIAGNOSIS — M0579 Rheumatoid arthritis with rheumatoid factor of multiple sites without organ or systems involvement: Secondary | ICD-10-CM | POA: Diagnosis not present

## 2021-02-03 DIAGNOSIS — T8484XD Pain due to internal orthopedic prosthetic devices, implants and grafts, subsequent encounter: Secondary | ICD-10-CM | POA: Diagnosis not present

## 2021-02-03 DIAGNOSIS — N179 Acute kidney failure, unspecified: Secondary | ICD-10-CM | POA: Diagnosis not present

## 2021-02-03 DIAGNOSIS — D631 Anemia in chronic kidney disease: Secondary | ICD-10-CM | POA: Diagnosis not present

## 2021-02-03 DIAGNOSIS — I129 Hypertensive chronic kidney disease with stage 1 through stage 4 chronic kidney disease, or unspecified chronic kidney disease: Secondary | ICD-10-CM | POA: Diagnosis not present

## 2021-02-03 DIAGNOSIS — T84092D Other mechanical complication of internal right knee prosthesis, subsequent encounter: Secondary | ICD-10-CM | POA: Diagnosis not present

## 2021-02-03 DIAGNOSIS — N183 Chronic kidney disease, stage 3 unspecified: Secondary | ICD-10-CM | POA: Diagnosis not present

## 2021-02-06 DIAGNOSIS — T8484XD Pain due to internal orthopedic prosthetic devices, implants and grafts, subsequent encounter: Secondary | ICD-10-CM | POA: Diagnosis not present

## 2021-02-06 DIAGNOSIS — N179 Acute kidney failure, unspecified: Secondary | ICD-10-CM | POA: Diagnosis not present

## 2021-02-06 DIAGNOSIS — D631 Anemia in chronic kidney disease: Secondary | ICD-10-CM | POA: Diagnosis not present

## 2021-02-06 DIAGNOSIS — I129 Hypertensive chronic kidney disease with stage 1 through stage 4 chronic kidney disease, or unspecified chronic kidney disease: Secondary | ICD-10-CM | POA: Diagnosis not present

## 2021-02-06 DIAGNOSIS — T84092D Other mechanical complication of internal right knee prosthesis, subsequent encounter: Secondary | ICD-10-CM | POA: Diagnosis not present

## 2021-02-06 DIAGNOSIS — N183 Chronic kidney disease, stage 3 unspecified: Secondary | ICD-10-CM | POA: Diagnosis not present

## 2021-02-10 ENCOUNTER — Inpatient Hospital Stay: Payer: Medicare Other

## 2021-02-10 ENCOUNTER — Encounter: Payer: Self-pay | Admitting: Family

## 2021-02-10 ENCOUNTER — Telehealth: Payer: Self-pay | Admitting: *Deleted

## 2021-02-10 ENCOUNTER — Other Ambulatory Visit: Payer: Self-pay

## 2021-02-10 ENCOUNTER — Inpatient Hospital Stay: Payer: Medicare Other | Attending: Hematology & Oncology

## 2021-02-10 ENCOUNTER — Inpatient Hospital Stay (HOSPITAL_BASED_OUTPATIENT_CLINIC_OR_DEPARTMENT_OTHER): Payer: Medicare Other | Admitting: Family

## 2021-02-10 VITALS — BP 100/59 | HR 95 | Temp 98.2°F | Resp 20 | Ht 59.0 in

## 2021-02-10 DIAGNOSIS — E611 Iron deficiency: Secondary | ICD-10-CM | POA: Diagnosis not present

## 2021-02-10 DIAGNOSIS — Z8572 Personal history of non-Hodgkin lymphomas: Secondary | ICD-10-CM | POA: Insufficient documentation

## 2021-02-10 DIAGNOSIS — M25461 Effusion, right knee: Secondary | ICD-10-CM | POA: Diagnosis not present

## 2021-02-10 DIAGNOSIS — D500 Iron deficiency anemia secondary to blood loss (chronic): Secondary | ICD-10-CM

## 2021-02-10 DIAGNOSIS — N189 Chronic kidney disease, unspecified: Secondary | ICD-10-CM | POA: Diagnosis not present

## 2021-02-10 DIAGNOSIS — D510 Vitamin B12 deficiency anemia due to intrinsic factor deficiency: Secondary | ICD-10-CM | POA: Diagnosis not present

## 2021-02-10 DIAGNOSIS — D509 Iron deficiency anemia, unspecified: Secondary | ICD-10-CM | POA: Insufficient documentation

## 2021-02-10 DIAGNOSIS — K909 Intestinal malabsorption, unspecified: Secondary | ICD-10-CM

## 2021-02-10 DIAGNOSIS — N183 Chronic kidney disease, stage 3 unspecified: Secondary | ICD-10-CM

## 2021-02-10 DIAGNOSIS — I129 Hypertensive chronic kidney disease with stage 1 through stage 4 chronic kidney disease, or unspecified chronic kidney disease: Secondary | ICD-10-CM | POA: Diagnosis not present

## 2021-02-10 DIAGNOSIS — D631 Anemia in chronic kidney disease: Secondary | ICD-10-CM | POA: Diagnosis not present

## 2021-02-10 DIAGNOSIS — Z79899 Other long term (current) drug therapy: Secondary | ICD-10-CM | POA: Insufficient documentation

## 2021-02-10 DIAGNOSIS — R5383 Other fatigue: Secondary | ICD-10-CM | POA: Diagnosis not present

## 2021-02-10 LAB — CBC WITH DIFFERENTIAL (CANCER CENTER ONLY)
Abs Immature Granulocytes: 0.05 10*3/uL (ref 0.00–0.07)
Basophils Absolute: 0 10*3/uL (ref 0.0–0.1)
Basophils Relative: 0 %
Eosinophils Absolute: 0 10*3/uL (ref 0.0–0.5)
Eosinophils Relative: 1 %
HCT: 27 % — ABNORMAL LOW (ref 36.0–46.0)
Hemoglobin: 8.7 g/dL — ABNORMAL LOW (ref 12.0–15.0)
Immature Granulocytes: 1 %
Lymphocytes Relative: 32 %
Lymphs Abs: 2 10*3/uL (ref 0.7–4.0)
MCH: 36.4 pg — ABNORMAL HIGH (ref 26.0–34.0)
MCHC: 32.2 g/dL (ref 30.0–36.0)
MCV: 113 fL — ABNORMAL HIGH (ref 80.0–100.0)
Monocytes Absolute: 0.5 10*3/uL (ref 0.1–1.0)
Monocytes Relative: 9 %
Neutro Abs: 3.5 10*3/uL (ref 1.7–7.7)
Neutrophils Relative %: 57 %
Platelet Count: 145 10*3/uL — ABNORMAL LOW (ref 150–400)
RBC: 2.39 MIL/uL — ABNORMAL LOW (ref 3.87–5.11)
RDW: 17.2 % — ABNORMAL HIGH (ref 11.5–15.5)
WBC Count: 6.2 10*3/uL (ref 4.0–10.5)
nRBC: 0.3 % — ABNORMAL HIGH (ref 0.0–0.2)

## 2021-02-10 LAB — RETICULOCYTES
Immature Retic Fract: 31.1 % — ABNORMAL HIGH (ref 2.3–15.9)
RBC.: 2.39 MIL/uL — ABNORMAL LOW (ref 3.87–5.11)
Retic Count, Absolute: 90.3 10*3/uL (ref 19.0–186.0)
Retic Ct Pct: 3.8 % — ABNORMAL HIGH (ref 0.4–3.1)

## 2021-02-10 LAB — CMP (CANCER CENTER ONLY)
ALT: 11 U/L (ref 0–44)
AST: 14 U/L — ABNORMAL LOW (ref 15–41)
Albumin: 3.7 g/dL (ref 3.5–5.0)
Alkaline Phosphatase: 58 U/L (ref 38–126)
Anion gap: 11 (ref 5–15)
BUN: 31 mg/dL — ABNORMAL HIGH (ref 8–23)
CO2: 24 mmol/L (ref 22–32)
Calcium: 9 mg/dL (ref 8.9–10.3)
Chloride: 107 mmol/L (ref 98–111)
Creatinine: 1.24 mg/dL — ABNORMAL HIGH (ref 0.44–1.00)
GFR, Estimated: 47 mL/min — ABNORMAL LOW (ref >60)
Glucose, Bld: 122 mg/dL — ABNORMAL HIGH (ref 70–99)
Potassium: 4 mmol/L (ref 3.5–5.1)
Sodium: 142 mmol/L (ref 135–145)
Total Bilirubin: 0.3 mg/dL (ref 0.3–1.2)
Total Protein: 6 g/dL — ABNORMAL LOW (ref 6.5–8.1)

## 2021-02-10 MED ORDER — EPOETIN ALFA-EPBX 40000 UNIT/ML IJ SOLN
40000.0000 [IU] | Freq: Once | INTRAMUSCULAR | Status: AC
  Administered 2021-02-10: 40000 [IU] via SUBCUTANEOUS

## 2021-02-10 MED ORDER — EPOETIN ALFA-EPBX 40000 UNIT/ML IJ SOLN
INTRAMUSCULAR | Status: AC
  Filled 2021-02-10: qty 1

## 2021-02-10 NOTE — Telephone Encounter (Signed)
Per 02/10/21 los - gave upcoming appointments - confirmed - print calendar

## 2021-02-10 NOTE — Patient Instructions (Signed)
Epoetin Alfa injection What is this medication? EPOETIN ALFA (e POE e tin AL fa) helps your body make more red blood cells. This medicine is used to treat anemia caused by chronic kidney disease, cancer chemotherapy, or HIV-therapy. It may also be used before surgery if you have anemia. This medicine may be used for other purposes; ask your health care provider or pharmacist if you have questions. COMMON BRAND NAME(S): Epogen, Procrit, Retacrit What should I tell my care team before I take this medication? They need to know if you have any of these conditions: cancer heart disease high blood pressure history of blood clots history of stroke low levels of folate, iron, or vitamin B12 in the blood seizures an unusual or allergic reaction to erythropoietin, albumin, benzyl alcohol, hamster proteins, other medicines, foods, dyes, or preservatives pregnant or trying to get pregnant breast-feeding How should I use this medication? This medicine is for injection into a vein or under the skin. It is usually given by a health care professional in a hospital or clinic setting. If you get this medicine at home, you will be taught how to prepare and give this medicine. Use exactly as directed. Take your medicine at regular intervals. Do not take your medicine more often than directed. It is important that you put your used needles and syringes in a special sharps container. Do not put them in a trash can. If you do not have a sharps container, call your pharmacist or healthcare provider to get one. A special MedGuide will be given to you by the pharmacist with each prescription and refill. Be sure to read this information carefully each time. Talk to your pediatrician regarding the use of this medicine in children. While this drug may be prescribed for selected conditions, precautions do apply. Overdosage: If you think you have taken too much of this medicine contact a poison control center or emergency  room at once. NOTE: This medicine is only for you. Do not share this medicine with others. What if I miss a dose? If you miss a dose, take it as soon as you can. If it is almost time for your next dose, take only that dose. Do not take double or extra doses. What may interact with this medication? Interactions have not been studied. This list may not describe all possible interactions. Give your health care provider a list of all the medicines, herbs, non-prescription drugs, or dietary supplements you use. Also tell them if you smoke, drink alcohol, or use illegal drugs. Some items may interact with your medicine. What should I watch for while using this medication? Your condition will be monitored carefully while you are receiving this medicine. You may need blood work done while you are taking this medicine. This medicine may cause a decrease in vitamin B6. You should make sure that you get enough vitamin B6 while you are taking this medicine. Discuss the foods you eat and the vitamins you take with your health care professional. What side effects may I notice from receiving this medication? Side effects that you should report to your doctor or health care professional as soon as possible: allergic reactions like skin rash, itching or hives, swelling of the face, lips, or tongue seizures signs and symptoms of a blood clot such as breathing problems; changes in vision; chest pain; severe, sudden headache; pain, swelling, warmth in the leg; trouble speaking; sudden numbness or weakness of the face, arm or leg signs and symptoms of a stroke like   changes in vision; confusion; trouble speaking or understanding; severe headaches; sudden numbness or weakness of the face, arm or leg; trouble walking; dizziness; loss of balance or coordination Side effects that usually do not require medical attention (report to your doctor or health care professional if they continue or are  bothersome): chills cough dizziness fever headaches joint pain muscle cramps muscle pain nausea, vomiting pain, redness, or irritation at site where injected This list may not describe all possible side effects. Call your doctor for medical advice about side effects. You may report side effects to FDA at 1-800-FDA-1088. Where should I keep my medication? Keep out of the reach of children. Store in a refrigerator between 2 and 8 degrees C (36 and 46 degrees F). Do not freeze or shake. Throw away any unused portion if using a single-dose vial. Multi-dose vials can be kept in the refrigerator for up to 21 days after the initial dose. Throw away unused medicine. NOTE: This sheet is a summary. It may not cover all possible information. If you have questions about this medicine, talk to your doctor, pharmacist, or health care provider.  2022 Elsevier/Gold Standard (2017-01-29 08:35:19)  

## 2021-02-10 NOTE — Progress Notes (Signed)
Hematology and Oncology Follow Up Visit  Helen Hardy [id number] Jun 13, 1951 70 y.o. 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 We last saw her in May 2022. She a right total knee revision with Dr. Alvan Dame on 12/12/2020. She states that she will also be needing right foot surgery. She is currently in an ankle brace for added support. She states that she is scheduled to start PT 3 days a week tomorrow and has another follow-up with Dr. Alvan Dame on 03/12/2021.  She is in a wheelchair today and at home.  No falls or syncope to report.  She has swelling at and below the right knee that is minimal. No redness or pitting edema noted. Pedal pulses are 2+.  No fever, chills, n/v, cough, rash, dizziness, SOB, chest pain, palpitations, abdominal pain or changes in bowel or bladder habits.  She notes fatigue and states that she just naps throughout the day and then finds it hard to sleep through the night.  She feels that her appetite is starting to improve and she is staying well hydrated. Her weight is stable at   ECOG Performance Status: 1 - Symptomatic but completely ambulatory  Medications:  Allergies as of 02/10/2021       Reactions   Oxycodone Nausea And Vomiting   Able tolerate with antinausea medicine   Tramadol Other (See Comments)   UNKNOWN REACTION        Medication List        Accurate as of February 10, 2021 12:59 PM. If you have any questions, ask your nurse or doctor.          acetaminophen 500 MG tablet Commonly known as: TYLENOL Take 2 tablets (1,000 mg total) by mouth every 8 (eight) hours.   docusate sodium 100 MG capsule Commonly known as: COLACE Take 1  capsule (100 mg total) by mouth 2 (two) times daily.   estradiol 1 MG tablet Commonly known as: ESTRACE Take 1 mg by mouth in the morning.   folic acid 1 MG tablet Commonly known as: FOLVITE Take 2 mg by mouth in the morning.   furosemide 40 MG tablet Commonly known as: LASIX Take 40 mg by mouth in the morning.   gabapentin 300 MG capsule Commonly known as: NEURONTIN Take 300-600 mg by mouth See admin instructions. Take 1 capsule (300 mg) by mouth in the morning, take 1 capsule (300 mg) by mouth in the afternoon & take 2 capsules (600 mg) by mouth at night.   methocarbamol 500 MG tablet Commonly known as: ROBAXIN Take 1 tablet (500 mg total) by mouth every 6 (six) hours as needed for muscle spasms.   multivitamin with minerals Tabs tablet Take 1 tablet by mouth in the morning.   olmesartan 20 MG tablet Commonly known as: BENICAR Take 20 mg by mouth in the morning.   ondansetron 4 MG tablet Commonly known as: ZOFRAN Take 4 mg by mouth every 8 (eight) hours as needed for nausea or vomiting.   Oxycodone HCl 10 MG Tabs Take 1 tablet (10 mg total) by mouth every 4 (four) hours as needed for severe pain (pain score 7-10).  pantoprazole 40 MG tablet Commonly known as: PROTONIX Take 40 mg by mouth in the morning.   polyethylene glycol 17 g packet Commonly known as: MIRALAX / GLYCOLAX Take 17 g by mouth daily as needed for mild constipation.   Potassium Chloride ER 20 MEQ Tbcr Take 20 mEq by mouth in the morning.   predniSONE 10 MG tablet Commonly known as: DELTASONE Take 10 mg by mouth daily with breakfast.   vitamin B-12 1000 MCG tablet Commonly known as: CYANOCOBALAMIN Take 1,000 mcg by mouth daily.   vitamin C 1000 MG tablet Take 1,000 mg by mouth daily.   Vitamin D3 50 MCG (2000 UT) Tabs Take 2,000 Units by mouth in the morning.        Allergies:  Allergies  Allergen Reactions   Oxycodone Nausea And Vomiting    Able tolerate with antinausea medicine    Tramadol Other (See Comments)    UNKNOWN REACTION    Past Medical History, Surgical history, Social history, and Family History were reviewed and updated.  Review of Systems: All other 10 point review of systems is negative.   Physical Exam:  height is 4' 11"  (1.499 m). Her oral temperature is 98.2 F (36.8 C). Her blood pressure is 100/59 (abnormal) and her pulse is 95. Her respiration is 20 and oxygen saturation is 98%.   Wt Readings from Last 3 Encounters:  12/12/20 216 lb 6.4 oz (98.2 kg)  11/22/20 217 lb 12.8 oz (98.8 kg)  11/19/20 214 lb (97.1 kg)    Ocular: Sclerae unicteric, pupils equal, round and reactive to light Ear-nose-throat: Oropharynx clear, dentition fair Lymphatic: No cervical or supraclavicular adenopathy Lungs no rales or rhonchi, good excursion bilaterally Heart regular rate and rhythm, no murmur appreciated Abd soft, nontender, positive bowel sounds MSK no focal spinal tenderness, no joint edema Neuro: non-focal, well-oriented, appropriate affect Breasts: Deferred   Lab Results  Component Value Date   WBC 6.2 02/10/2021   HGB 8.7 (L) 02/10/2021   HCT 27.0 (L) 02/10/2021   MCV 113.0 (H) 02/10/2021   PLT 145 (L) 02/10/2021   Lab Results  Component Value Date   FERRITIN 2,353 (H) 11/22/2020   IRON 124 11/22/2020   TIBC 223 (L) 11/22/2020   UIBC 98 (L) 11/22/2020   IRONPCTSAT 56 11/22/2020   Lab Results  Component Value Date   RETICCTPCT 3.8 (H) 02/10/2021   RBC 2.39 (L) 02/10/2021   RBC 2.39 (L) 02/10/2021   RETICCTABS 41.0 06/21/2015   No results found for: KPAFRELGTCHN, LAMBDASER, KAPLAMBRATIO No results found for: IGGSERUM, IGA, IGMSERUM No results found for: Kathrynn Ducking, MSPIKE, SPEI   Chemistry      Component Value Date/Time   NA 142 02/10/2021 1103   NA 144 04/29/2017 0929   K 4.0 02/10/2021 1103   K 4.0 04/29/2017 0929   CL 107 02/10/2021 1103   CL 103 06/21/2015 0800   CO2 24  02/10/2021 1103   CO2 25 04/29/2017 0929   BUN 31 (H) 02/10/2021 1103   BUN 17.7 04/29/2017 0929   CREATININE 1.24 (H) 02/10/2021 1103   CREATININE 0.8 04/29/2017 0929      Component Value Date/Time   CALCIUM 9.0 02/10/2021 1103   CALCIUM 9.3 04/29/2017 0929   ALKPHOS 58 02/10/2021 1103   ALKPHOS 55 04/29/2017 0929   AST 14 (L) 02/10/2021 1103   AST 19 04/29/2017 0929   ALT 11 02/10/2021 1103   ALT 16 04/29/2017 0929   BILITOT 0.3  02/10/2021 1103   BILITOT 0.43 04/29/2017 0929       Impression and Plan: Ms. Lavalais is a very pleasant 69 yo African American female with 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  She also has multifactorial anemia.  ESA given today, Hgb 8.7.  Iron studies are pending. We will replace if needed.  Follow-up in 4 weeks.  She can contact our office with any questions or concerns.   Laverna Peace, NP 8/8/202212:59 PM

## 2021-02-11 DIAGNOSIS — M25561 Pain in right knee: Secondary | ICD-10-CM | POA: Diagnosis not present

## 2021-02-11 DIAGNOSIS — Z96651 Presence of right artificial knee joint: Secondary | ICD-10-CM | POA: Diagnosis not present

## 2021-02-11 LAB — IRON AND TIBC
Iron: 97 ug/dL (ref 41–142)
Saturation Ratios: 40 % (ref 21–57)
TIBC: 240 ug/dL (ref 236–444)
UIBC: 143 ug/dL (ref 120–384)

## 2021-02-11 LAB — FERRITIN: Ferritin: 2186 ng/mL — ABNORMAL HIGH (ref 11–307)

## 2021-02-13 DIAGNOSIS — I10 Essential (primary) hypertension: Secondary | ICD-10-CM | POA: Diagnosis not present

## 2021-02-13 DIAGNOSIS — R262 Difficulty in walking, not elsewhere classified: Secondary | ICD-10-CM | POA: Diagnosis not present

## 2021-02-13 DIAGNOSIS — M069 Rheumatoid arthritis, unspecified: Secondary | ICD-10-CM | POA: Diagnosis not present

## 2021-02-13 DIAGNOSIS — K219 Gastro-esophageal reflux disease without esophagitis: Secondary | ICD-10-CM | POA: Diagnosis not present

## 2021-02-14 DIAGNOSIS — M25561 Pain in right knee: Secondary | ICD-10-CM | POA: Diagnosis not present

## 2021-02-14 DIAGNOSIS — Z96651 Presence of right artificial knee joint: Secondary | ICD-10-CM | POA: Diagnosis not present

## 2021-02-18 DIAGNOSIS — Z96651 Presence of right artificial knee joint: Secondary | ICD-10-CM | POA: Diagnosis not present

## 2021-02-18 DIAGNOSIS — M25561 Pain in right knee: Secondary | ICD-10-CM | POA: Diagnosis not present

## 2021-02-21 ENCOUNTER — Encounter (HOSPITAL_COMMUNITY): Payer: Self-pay

## 2021-02-21 ENCOUNTER — Emergency Department (HOSPITAL_COMMUNITY): Payer: Medicare Other

## 2021-02-21 ENCOUNTER — Other Ambulatory Visit: Payer: Self-pay

## 2021-02-21 ENCOUNTER — Emergency Department (HOSPITAL_COMMUNITY)
Admission: EM | Admit: 2021-02-21 | Discharge: 2021-02-21 | Disposition: A | Discharge status: Home / Self Care | Payer: Medicare Other | Attending: Emergency Medicine | Admitting: Emergency Medicine

## 2021-02-21 DIAGNOSIS — G319 Degenerative disease of nervous system, unspecified: Secondary | ICD-10-CM | POA: Diagnosis not present

## 2021-02-21 DIAGNOSIS — S0990XA Unspecified injury of head, initial encounter: Secondary | ICD-10-CM | POA: Insufficient documentation

## 2021-02-21 DIAGNOSIS — Z79899 Other long term (current) drug therapy: Secondary | ICD-10-CM | POA: Insufficient documentation

## 2021-02-21 DIAGNOSIS — Y92002 Bathroom of unspecified non-institutional (private) residence single-family (private) house as the place of occurrence of the external cause: Secondary | ICD-10-CM | POA: Insufficient documentation

## 2021-02-21 DIAGNOSIS — I129 Hypertensive chronic kidney disease with stage 1 through stage 4 chronic kidney disease, or unspecified chronic kidney disease: Secondary | ICD-10-CM | POA: Diagnosis not present

## 2021-02-21 DIAGNOSIS — D631 Anemia in chronic kidney disease: Secondary | ICD-10-CM | POA: Insufficient documentation

## 2021-02-21 DIAGNOSIS — M25561 Pain in right knee: Secondary | ICD-10-CM | POA: Diagnosis not present

## 2021-02-21 DIAGNOSIS — Z96651 Presence of right artificial knee joint: Secondary | ICD-10-CM | POA: Diagnosis not present

## 2021-02-21 DIAGNOSIS — I10 Essential (primary) hypertension: Secondary | ICD-10-CM | POA: Diagnosis not present

## 2021-02-21 DIAGNOSIS — Z96653 Presence of artificial knee joint, bilateral: Secondary | ICD-10-CM | POA: Diagnosis not present

## 2021-02-21 DIAGNOSIS — W050XXA Fall from non-moving wheelchair, initial encounter: Secondary | ICD-10-CM | POA: Insufficient documentation

## 2021-02-21 DIAGNOSIS — N183 Chronic kidney disease, stage 3 unspecified: Secondary | ICD-10-CM | POA: Diagnosis not present

## 2021-02-21 DIAGNOSIS — S8991XA Unspecified injury of right lower leg, initial encounter: Secondary | ICD-10-CM | POA: Diagnosis not present

## 2021-02-21 DIAGNOSIS — S80919A Unspecified superficial injury of unspecified knee, initial encounter: Secondary | ICD-10-CM | POA: Diagnosis not present

## 2021-02-21 DIAGNOSIS — W19XXXA Unspecified fall, initial encounter: Secondary | ICD-10-CM | POA: Diagnosis not present

## 2021-02-21 DIAGNOSIS — R609 Edema, unspecified: Secondary | ICD-10-CM | POA: Diagnosis not present

## 2021-02-21 MED ORDER — ACETAMINOPHEN 325 MG PO TABS: 650.0000 mg | ORAL_TABLET | Freq: Once | ORAL | Status: DC

## 2021-02-21 MED ORDER — OXYCODONE HCL 5 MG PO TABS
10.0000 mg | ORAL_TABLET | Freq: Once | ORAL | Status: AC
  Administered 2021-02-21: 10 mg via ORAL
  Filled 2021-02-21: qty 2

## 2021-02-21 MED ORDER — ACETAMINOPHEN 500 MG PO TABS
1000.0000 mg | ORAL_TABLET | Freq: Once | ORAL | Status: AC
  Administered 2021-02-21: 1000 mg via ORAL
  Filled 2021-02-21: qty 2

## 2021-02-21 NOTE — ED Provider Notes (Signed)
[facility] DEPT Provider Note   CSN: [id number] Arrival date & time: 02/21/21  1614     History Chief Complaint  Patient presents with   Fall   Head Injury   Knee Injury    Helen Hardy is a 70 y.o. female.  Patient presents to the emergency department for evaluation of injury sustained after a fall.  Patient had recent surgery on her right knee.  She has an rehab for this.  She was leaving rehab session today and needed to use the restroom.  She got out of her wheelchair and transferred to the toilet, but fell in the process hitting the back of her head.  She also twisted her right knee.  She does not have pain at rest but has pain with bearing weight and with movement.  No treatments prior to arrival.  No loss of consciousness, vomiting, confusion.  She is not on any anticoagulation.  She does take oxycodone 10 mg tablets several times a day for pain related to her surgery as well as rheumatoid arthritis.      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   GI bleed 03/25/2020   SIRS (systemic inflammatory response syndrome) ([place]) 03/25/2020   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   Cecal diverticulitis 12/25/2019   Rheumatoid arthritis ([place]) 12/25/2019   GERD (gastroesophageal reflux disease) 12/25/2019   Essential hypertension 12/25/2019   AKI (acute kidney injury) ([place]) 12/25/2019   Hyperglycemia 12/25/2019   Chronic radicular lumbar pain 12/25/2019   Generalized weakness 12/25/2019    Anemia of chronic renal failure, stage 3 (moderate) ([place]) 12/30/2018   Lumbar disc herniation 03/04/2016   Iron deficiency anemia 06/21/2015   Malabsorption of iron 06/21/2015   Morbid obesity ([place]) 04/10/2015   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   S/P knee replacement 04/08/2015    Past Surgical History:  Procedure Laterality Date   2012 Right big toe and toe beside - had pins in toes     ABDOMINAL HYSTERECTOMY     BIOPSY  04/01/2020   Procedure: BIOPSY;  Surgeon: Juanita Craver, MD;  Location: [facility];  Service: Gastroenterology;;   bladder tack - 2005     COLONOSCOPY WITH PROPOFOL N/A 04/01/2020   Procedure: COLONOSCOPY WITH PROPOFOL;  Surgeon: Juanita Craver, MD;  Location: [facility];  Service: Gastroenterology;  Laterality: N/A;   EYE SURGERY Bilateral    cataract   JOINT REPLACEMENT Bilateral [place] MICRODISCECTOMY N/A 03/04/2016   Procedure: RIGHT FAR LATERAL DISCECTOMY L5-S1;  Surgeon: Melina Schools, MD;  Location: [facility];  Service: Orthopedics;  Laterality: N/A;   PORTACATH PLACEMENT     PORTACATH REMOVAL     ROTATOR CUFF REPAIR Left    TOTAL KNEE REVISION Left 04/08/2015   Procedure: REVISION LEFT TOTAL KNEE ;  Surgeon: Paralee Cancel, MD;  Location: [facility];  Service: Orthopedics;  Laterality: Left;   TOTAL  KNEE REVISION Right 12/12/2020   Procedure: TOTAL KNEE REVISION;  Surgeon: Paralee Cancel, MD;  Location: [facility];  Service: Orthopedics;  Laterality: Right;     OB History   No obstetric history on file.     History reviewed. No pertinent family history.  Social History   Tobacco Use   Smoking status: Never   Smokeless tobacco: Never  Vaping Use   Vaping Use: Never used  Substance Use Topics   Alcohol use: No    Alcohol/week: 0.0 standard drinks   Drug use: No    Home Medications Prior to Admission medications   Medication Sig Start Date End Date Taking? Authorizing Provider  acetaminophen (TYLENOL) 500 MG  tablet Take 2 tablets (1,000 mg total) by mouth every 8 (eight) hours. Patient not taking: Reported on 02/10/2021 12/17/20   Irving Copas, PA-C  Ascorbic Acid (VITAMIN C) 1000 MG tablet Take 1,000 mg by mouth daily.    [provider]  Cholecalciferol (VITAMIN D3) 50 MCG (2000 UT) TABS Take 2,000 Units by mouth in the morning.    [provider]  docusate sodium (COLACE) 100 MG capsule Take 1 capsule (100 mg total) by mouth 2 (two) times daily. 12/17/20   Irving Copas, PA-C  estradiol (ESTRACE) 1 MG tablet Take 1 mg by mouth in the morning. 12/25/12   [provider]  folic acid (FOLVITE) 1 MG tablet Take 2 mg by mouth in the morning.    [provider]  furosemide (LASIX) 40 MG tablet Take 40 mg by mouth in the morning. 04/22/20   [provider]  gabapentin (NEURONTIN) 300 MG capsule Take 300-600 mg by mouth See admin instructions. Take 1 capsule (300 mg) by mouth in the morning, take 1 capsule (300 mg) by mouth in the afternoon & take 2 capsules (600 mg) by mouth at night. 03/17/18   [provider]  methocarbamol (ROBAXIN) 500 MG tablet Take 1 tablet (500 mg total) by mouth every 6 (six) hours as needed for muscle spasms. 12/17/20   Irving Copas, PA-C  Multiple Vitamin (MULTIVITAMIN WITH MINERALS) TABS tablet Take 1 tablet by mouth in the morning.    [provider]  olmesartan (BENICAR) 20 MG tablet Take 20 mg by mouth in the morning. 12/27/18   [provider]  ondansetron (ZOFRAN) 4 MG tablet Take 4 mg by mouth every 8 (eight) hours as needed for nausea or vomiting.  04/10/19   [provider]  oxyCODONE 10 MG TABS Take 1 tablet (10 mg total) by mouth every 4 (four) hours as needed for severe pain (pain score 7-10). 12/17/20   Irving Copas, PA-C  pantoprazole (PROTONIX) 40 MG tablet Take 40 mg by mouth in the morning.    [provider]  polyethylene glycol (MIRALAX / GLYCOLAX) 17 g packet Take  17 g by mouth daily as needed for mild constipation. 12/17/20   Irving Copas, PA-C  Potassium Chloride ER 20 MEQ TBCR Take 20 mEq by mouth in the morning. 05/09/20   [provider]  predniSONE (DELTASONE) 10 MG tablet Take 10 mg by mouth daily with breakfast. 04/12/18   [provider]  vitamin B-12 (CYANOCOBALAMIN) 1000 MCG tablet Take 1,000 mcg by mouth daily.    [provider]    Allergies    Oxycodone and Tramadol  Review of Systems   Review of Systems  Constitutional:  Negative for fever.  HENT:  Negative for rhinorrhea and sore  throat.   Eyes:  Negative for redness.  Respiratory:  Negative for cough.   Cardiovascular:  Negative for chest pain.  Gastrointestinal:  Negative for abdominal pain, diarrhea, nausea and vomiting.  Genitourinary:  Negative for dysuria, frequency, hematuria and urgency.  Musculoskeletal:  Positive for arthralgias. Negative for myalgias.  Skin:  Negative for rash.  Neurological:  Positive for headaches (Mild).   Physical Exam Updated Vital Signs BP (!) 164/106 (BP Location: Left Arm)   Pulse 74   Temp 98.4 F (36.9 C) (Oral)   Resp 20   Ht 4' 11"  (1.499 m)   Wt 93.4 kg   SpO2 100%   BMI 41.61 kg/m   Physical Exam Vitals and nursing note reviewed.  Constitutional:      Appearance: She is well-developed.  HENT:     Head: Normocephalic and atraumatic. No raccoon eyes or Battle's sign.     Right Ear: Tympanic membrane, ear canal and external ear normal. No hemotympanum.     Left Ear: Tympanic membrane, ear canal and external ear normal. No hemotympanum.     Nose: Nose normal.     Mouth/Throat:     Pharynx: Uvula midline.  Eyes:     General: Lids are normal.     Extraocular Movements:     Right eye: No nystagmus.     Left eye: No nystagmus.     Conjunctiva/sclera: Conjunctivae normal.     Pupils: Pupils are equal, round, and reactive to light.     Comments: No visible hyphema noted  Cardiovascular:     Rate  and Rhythm: Normal rate and regular rhythm.  Pulmonary:     Effort: Pulmonary effort is normal.     Breath sounds: Normal breath sounds.  Abdominal:     Palpations: Abdomen is soft.     Tenderness: There is no abdominal tenderness.  Musculoskeletal:     Cervical back: Normal range of motion and neck supple. No tenderness or bony tenderness.     Thoracic back: No tenderness or bony tenderness.     Lumbar back: No tenderness or bony tenderness.     Right hip: No tenderness or bony tenderness.     Right ankle: No tenderness. Normal range of motion.     Comments: Right knee: Patient with postsurgical changes, well-healed surgical scars.  No obvious acute swelling.  Patient reports pain behind the knee with movement.  No point tenderness or tenderness to palpation.  Skin:    General: Skin is warm and dry.  Neurological:     Mental Status: She is alert and oriented to person, place, and time.     GCS: GCS eye subscore is 4. GCS verbal subscore is 5. GCS motor subscore is 6.     Cranial Nerves: No cranial nerve deficit.     Sensory: No sensory deficit.     Coordination: Coordination normal.     Deep Tendon Reflexes: Reflexes are normal and symmetric.    ED Results / Procedures / Treatments   Labs (all labs ordered are listed, but only abnormal results are displayed) Labs Reviewed - No data to display  EKG None  Radiology No results found.  Procedures Procedures   Medications Ordered in ED Medications  oxyCODONE (Oxy IR/ROXICODONE) immediate release tablet 10 mg (has no administration in time range)    ED Course  I have reviewed the triage vital signs and the nursing notes.  Pertinent labs & imaging results that were available during my care of the  patient were reviewed by me and considered in my medical decision making (see chart for details).  Patient seen and examined. Work-up initiated. Medications ordered.   Vital signs reviewed and are as follows: BP (!) 164/106 (BP  Location: Left Arm)   Pulse 74   Temp 98.4 F (36.9 C) (Oral)   Resp 20   Ht 4' 11"  (1.499 m)   Wt 93.4 kg   SpO2 100%   BMI 41.61 kg/m   8:10 PM imaging negative.  Patient updated.  Additional Tylenol given per patient request.  Husband has a wheelchair to help the patient get home.  Plan for discharge.  Encourage PCP follow-up next week if symptoms or not improved.  Otherwise continue home pain medication regimen, apply ice/rice protocol.     MDM Rules/Calculators/A&P                           Patient here for injuries after mechanical fall.  She did hit her head, CT imaging negative.  Right knee pain, imaging negative.  Routine care indicated at this time.    Final Clinical Impression(s) / ED Diagnoses Final diagnoses:  Fall, initial encounter  Minor head injury, initial encounter  Injury of right knee, initial encounter    Rx / DC Orders ED Discharge Orders     None        Carlisle Cater, Hershal Coria 02/21/21 2011    Wyvonnia Dusky, MD 02/22/21 1213

## 2021-02-21 NOTE — Discharge Instructions (Signed)
Please read and follow all provided instructions.  Your diagnoses today include:  1. Fall, initial encounter   2. Minor head injury, initial encounter   3. Injury of right knee, initial encounter     Tests performed today include: CT scan of your head that did not show any serious injury. X-ray of your knee -showed some swelling but no injuries to the bones Vital signs. See below for your results today.   Medications prescribed:  None  Take any prescribed medications only as directed.  Home care instructions:  Follow any educational materials contained in this packet.  BE VERY CAREFUL not to take multiple medicines containing Tylenol (also called acetaminophen). Doing so can lead to an overdose which can damage your liver and cause liver failure and possibly death.   Follow-up instructions: Please follow-up with your primary care provider or orthopedic provider as needed for further evaluation of your symptoms.   Return instructions:  SEEK IMMEDIATE MEDICAL ATTENTION IF: There is confusion or drowsiness (although children frequently become drowsy after injury).  You cannot awaken the injured person.  You have more than one episode of vomiting.  You notice dizziness or unsteadiness which is getting worse, or inability to walk.  You have convulsions or unconsciousness.  You experience severe, persistent headaches not relieved by Tylenol. You cannot use arms or legs normally.  There are changes in pupil sizes. (This is the black center in the colored part of the eye)  There is clear or bloody discharge from the nose or ears.  You have change in speech, vision, swallowing, or understanding.  Localized weakness, numbness, tingling, or change in bowel or bladder control. You have any other emergent concerns.  Additional Information: You have had a head injury which does not appear to require admission at this time.  Your vital signs today were: BP (!) 164/106 (BP Location: Left  Arm)   Pulse 74   Temp 98.4 F (36.9 C) (Oral)   Resp 20   Ht 4' 11"  (1.499 m)   Wt 93.4 kg   SpO2 100%   BMI 41.61 kg/m  If your blood pressure (BP) was elevated above 135/85 this visit, please have this repeated by your doctor within one month. --------------

## 2021-02-21 NOTE — ED Triage Notes (Signed)
Per EMS- Patient was in the [facility] bathroom, and got out of her wheelchair to the toilet. Patient slipped on the floor. Patient hit her head on the floor. No LOC. Patient also c/o right knee injury and reports that she just got out of rehab from having a right knee replacement in June/2022.

## 2021-02-25 DIAGNOSIS — M25561 Pain in right knee: Secondary | ICD-10-CM | POA: Diagnosis not present

## 2021-02-25 DIAGNOSIS — Z96651 Presence of right artificial knee joint: Secondary | ICD-10-CM | POA: Diagnosis not present

## 2021-02-27 DIAGNOSIS — M25561 Pain in right knee: Secondary | ICD-10-CM | POA: Diagnosis not present

## 2021-02-27 DIAGNOSIS — Z96651 Presence of right artificial knee joint: Secondary | ICD-10-CM | POA: Diagnosis not present

## 2021-03-03 DIAGNOSIS — M76821 Posterior tibial tendinitis, right leg: Secondary | ICD-10-CM | POA: Diagnosis not present

## 2021-03-04 DIAGNOSIS — M0579 Rheumatoid arthritis with rheumatoid factor of multiple sites without organ or systems involvement: Secondary | ICD-10-CM | POA: Diagnosis not present

## 2021-03-11 ENCOUNTER — Inpatient Hospital Stay: Payer: Medicare Other | Attending: Hematology & Oncology

## 2021-03-11 ENCOUNTER — Inpatient Hospital Stay (HOSPITAL_BASED_OUTPATIENT_CLINIC_OR_DEPARTMENT_OTHER): Payer: Medicare Other | Admitting: Family

## 2021-03-11 ENCOUNTER — Other Ambulatory Visit: Payer: Self-pay

## 2021-03-11 ENCOUNTER — Inpatient Hospital Stay: Payer: Medicare Other

## 2021-03-11 ENCOUNTER — Telehealth: Payer: Self-pay | Admitting: *Deleted

## 2021-03-11 ENCOUNTER — Encounter: Payer: Self-pay | Admitting: Family

## 2021-03-11 VITALS — HR 93 | Temp 97.8°F | Resp 18 | Wt 206.0 lb

## 2021-03-11 DIAGNOSIS — N183 Chronic kidney disease, stage 3 unspecified: Secondary | ICD-10-CM

## 2021-03-11 DIAGNOSIS — D500 Iron deficiency anemia secondary to blood loss (chronic): Secondary | ICD-10-CM

## 2021-03-11 DIAGNOSIS — I129 Hypertensive chronic kidney disease with stage 1 through stage 4 chronic kidney disease, or unspecified chronic kidney disease: Secondary | ICD-10-CM | POA: Insufficient documentation

## 2021-03-11 DIAGNOSIS — D631 Anemia in chronic kidney disease: Secondary | ICD-10-CM | POA: Insufficient documentation

## 2021-03-11 DIAGNOSIS — D510 Vitamin B12 deficiency anemia due to intrinsic factor deficiency: Secondary | ICD-10-CM

## 2021-03-11 DIAGNOSIS — N189 Chronic kidney disease, unspecified: Secondary | ICD-10-CM | POA: Insufficient documentation

## 2021-03-11 DIAGNOSIS — K909 Intestinal malabsorption, unspecified: Secondary | ICD-10-CM

## 2021-03-11 DIAGNOSIS — Z79899 Other long term (current) drug therapy: Secondary | ICD-10-CM | POA: Insufficient documentation

## 2021-03-11 LAB — CBC WITH DIFFERENTIAL (CANCER CENTER ONLY)
Abs Immature Granulocytes: 0.06 10*3/uL (ref 0.00–0.07)
Basophils Absolute: 0 10*3/uL (ref 0.0–0.1)
Basophils Relative: 0 %
Eosinophils Absolute: 0.1 10*3/uL (ref 0.0–0.5)
Eosinophils Relative: 1 %
HCT: 31.6 % — ABNORMAL LOW (ref 36.0–46.0)
Hemoglobin: 10 g/dL — ABNORMAL LOW (ref 12.0–15.0)
Immature Granulocytes: 1 %
Lymphocytes Relative: 52 %
Lymphs Abs: 3.8 10*3/uL (ref 0.7–4.0)
MCH: 37 pg — ABNORMAL HIGH (ref 26.0–34.0)
MCHC: 31.6 g/dL (ref 30.0–36.0)
MCV: 117 fL — ABNORMAL HIGH (ref 80.0–100.0)
Monocytes Absolute: 0.7 10*3/uL (ref 0.1–1.0)
Monocytes Relative: 9 %
Neutro Abs: 2.7 10*3/uL (ref 1.7–7.7)
Neutrophils Relative %: 37 %
Platelet Count: 149 10*3/uL — ABNORMAL LOW (ref 150–400)
RBC: 2.7 MIL/uL — ABNORMAL LOW (ref 3.87–5.11)
RDW: 15.6 % — ABNORMAL HIGH (ref 11.5–15.5)
WBC Count: 7.3 10*3/uL (ref 4.0–10.5)
nRBC: 0 % (ref 0.0–0.2)

## 2021-03-11 LAB — CMP (CANCER CENTER ONLY)
ALT: 9 U/L (ref 0–44)
AST: 17 U/L (ref 15–41)
Albumin: 4 g/dL (ref 3.5–5.0)
Alkaline Phosphatase: 64 U/L (ref 38–126)
Anion gap: 12 (ref 5–15)
BUN: 35 mg/dL — ABNORMAL HIGH (ref 8–23)
CO2: 29 mmol/L (ref 22–32)
Calcium: 9.6 mg/dL (ref 8.9–10.3)
Chloride: 100 mmol/L (ref 98–111)
Creatinine: 2.37 mg/dL — ABNORMAL HIGH (ref 0.44–1.00)
GFR, Estimated: 21 mL/min — ABNORMAL LOW (ref >60)
Glucose, Bld: 108 mg/dL — ABNORMAL HIGH (ref 70–99)
Potassium: 4 mmol/L (ref 3.5–5.1)
Sodium: 141 mmol/L (ref 135–145)
Total Bilirubin: 0.5 mg/dL (ref 0.3–1.2)
Total Protein: 6.6 g/dL (ref 6.5–8.1)

## 2021-03-11 LAB — RETICULOCYTES
Immature Retic Fract: 17.1 % — ABNORMAL HIGH (ref 2.3–15.9)
RBC.: 2.7 MIL/uL — ABNORMAL LOW (ref 3.87–5.11)
Retic Count, Absolute: 51 10*3/uL (ref 19.0–186.0)
Retic Ct Pct: 1.9 % (ref 0.4–3.1)

## 2021-03-11 LAB — VITAMIN B12: Vitamin B-12: 1040 pg/mL — ABNORMAL HIGH (ref 180–914)

## 2021-03-11 LAB — FOLATE: Folate: >100 ng/mL (ref >5.9)

## 2021-03-11 MED ORDER — EPOETIN ALFA-EPBX 40000 UNIT/ML IJ SOLN
40000.0000 [IU] | Freq: Once | INTRAMUSCULAR | Status: AC
Start: 2021-03-11 — End: 2021-03-11
  Administered 2021-03-11: 40000 [IU] via SUBCUTANEOUS
  Filled 2021-03-11: qty 1

## 2021-03-11 NOTE — Patient Instructions (Signed)

## 2021-03-11 NOTE — Telephone Encounter (Signed)
Per 03/11/21 los - gave upcoming appointments - confirmed - print calendar

## 2021-03-11 NOTE — Progress Notes (Signed)
Hematology and Oncology Follow Up Visit  Helen Hardy [id number] Feb 02, 1951 70 y.o. 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 She is feeling fatigued and states that she likes to nap a good bit of the time.  She had a fall on August 19th but thankfully was not seriously injured. She is still having chronic right knee and foot pain and has an appointment with her orthopedist tomorrow to discuss treatment.  She has braces on both lower extremities and supportive tennis shoes.  No new falls to report. No syncope.  No fever, chills, n/v, cough, rash, dizziness, SOB, chest pain, palpitations, abdominal pain or changes in bowel or bladder habits.  She has swelling that waxes and wanes in both lower extremities.  No episodes of bleeding noted. No bruising or petechiae.  She states that she has not been eating well but will make an effort to increase her intake. She feels that she is staying well hydrated. Her weight is stable at 206 lbs.   ECOG Performance Status: 1 - Symptomatic but completely ambulatory  Medications:  Allergies as of 03/11/2021       Reactions   Oxycodone Nausea And Vomiting   Able tolerate with antinausea medicine   Tramadol Other (See Comments)   UNKNOWN REACTION        Medication List        Accurate as of March 11, 2021  2:55 PM. If you have any questions, ask your nurse or doctor.          acetaminophen 500 MG tablet Commonly known as: TYLENOL Take 2 tablets (1,000 mg total) by mouth every 8 (eight) hours.   docusate sodium 100 MG capsule Commonly known as: COLACE Take 1 capsule (100 mg total) by mouth 2 (two) times daily.    estradiol 1 MG tablet Commonly known as: ESTRACE Take 1 mg by mouth in the morning.   folic acid 1 MG tablet Commonly known as: FOLVITE Take 2 mg by mouth in the morning.   furosemide 40 MG tablet Commonly known as: LASIX Take 40 mg by mouth in the morning.   gabapentin 300 MG capsule Commonly known as: NEURONTIN Take 300-600 mg by mouth See admin instructions. Take 1 capsule (300 mg) by mouth in the morning, take 1 capsule (300 mg) by mouth in the afternoon & take 2 capsules (600 mg) by mouth at night.   methocarbamol 500 MG tablet Commonly known as: ROBAXIN Take 1 tablet (500 mg total) by mouth every 6 (six) hours as needed for muscle spasms.   multivitamin with minerals Tabs tablet Take 1 tablet by mouth in the morning.   olmesartan 20 MG tablet Commonly known as: BENICAR Take 20 mg by mouth in the morning.   ondansetron 4 MG tablet Commonly known as: ZOFRAN Take 4 mg by mouth every 8 (eight) hours as needed for nausea or vomiting.   Oxycodone HCl 10 MG Tabs Take 1 tablet (10 mg total) by mouth every 4 (four) hours as needed for severe pain (pain score 7-10).   pantoprazole 40 MG tablet Commonly known as: PROTONIX Take 40 mg by mouth in the morning.  polyethylene glycol 17 g packet Commonly known as: MIRALAX / GLYCOLAX Take 17 g by mouth daily as needed for mild constipation.   Potassium Chloride ER 20 MEQ Tbcr Take 20 mEq by mouth in the morning.   predniSONE 10 MG tablet Commonly known as: DELTASONE Take 10 mg by mouth daily with breakfast.   vitamin B-12 1000 MCG tablet Commonly known as: CYANOCOBALAMIN Take 1,000 mcg by mouth daily.   vitamin C 1000 MG tablet Take 1,000 mg by mouth daily.   Vitamin D3 50 MCG (2000 UT) Tabs Take 2,000 Units by mouth in the morning.        Allergies:  Allergies  Allergen Reactions   Oxycodone Nausea And Vomiting    Able tolerate with antinausea medicine   Tramadol Other (See Comments)    UNKNOWN REACTION     Past Medical History, Surgical history, Social history, and Family History were reviewed and updated.  Review of Systems: All other 10 point review of systems is negative.   Physical Exam:  weight is 206 lb (93.4 kg). Her oral temperature is 97.8 F (36.6 C). Her pulse is 93. Her respiration is 18 and oxygen saturation is 99%.   Wt Readings from Last 3 Encounters:  03/11/21 206 lb (93.4 kg)  02/21/21 206 lb (93.4 kg)  12/12/20 216 lb 6.4 oz (98.2 kg)    Ocular: Sclerae unicteric, pupils equal, round and reactive to light Ear-nose-throat: Oropharynx clear, dentition fair Lymphatic: No cervical or supraclavicular adenopathy Lungs no rales or rhonchi, good excursion bilaterally Heart regular rate and rhythm, no murmur appreciated Abd soft, nontender, positive bowel sounds MSK no focal spinal tenderness, no joint edema Neuro: non-focal, well-oriented, appropriate affect Breasts: Deferred   Lab Results  Component Value Date   WBC 7.3 03/11/2021   HGB 10.0 (L) 03/11/2021   HCT 31.6 (L) 03/11/2021   MCV 117.0 (H) 03/11/2021   PLT 149 (L) 03/11/2021   Lab Results  Component Value Date   FERRITIN 2,186 (H) 02/10/2021   IRON 97 02/10/2021   TIBC 240 02/10/2021   UIBC 143 02/10/2021   IRONPCTSAT 40 02/10/2021   Lab Results  Component Value Date   RETICCTPCT 1.9 03/11/2021   RBC 2.70 (L) 03/11/2021   RBC 2.70 (L) 03/11/2021   RETICCTABS 41.0 06/21/2015   No results found for: KPAFRELGTCHN, LAMBDASER, KAPLAMBRATIO No results found for: IGGSERUM, IGA, IGMSERUM No results found for: Odetta Pink, SPEI   Chemistry      Component Value Date/Time   NA 141 03/11/2021 1421   NA 144 04/29/2017 0929   K 4.0 03/11/2021 1421   K 4.0 04/29/2017 0929   CL 100 03/11/2021 1421   CL 103 06/21/2015 0800   CO2 29 03/11/2021 1421   CO2 25 04/29/2017 0929   BUN 35 (H) 03/11/2021 1421   BUN 17.7 04/29/2017 0929   CREATININE  2.37 (H) 03/11/2021 1421   CREATININE 0.8 04/29/2017 0929      Component Value Date/Time   CALCIUM 9.6 03/11/2021 1421   CALCIUM 9.3 04/29/2017 0929   ALKPHOS 64 03/11/2021 1421   ALKPHOS 55 04/29/2017 0929   AST 17 03/11/2021 1421   AST 19 04/29/2017 0929   ALT 9 03/11/2021 1421   ALT 16 04/29/2017 0929   BILITOT 0.5 03/11/2021 1421   BILITOT 0.43 04/29/2017 0929       Impression and Plan: Helen Hardy is a very pleasant 70 yo [place] American female with history of  low grade non-Hodgkin's lymphoma now in clinical remission. She has multifactorial anemia.  ESA given for hgb 10.0.  Iron studies are pending. We will replace if needed.  Follow-up in 4 weeks.  She can contact our office with any questions or concerns.   Laverna Peace, NP 9/6/20222:55 PM

## 2021-03-12 DIAGNOSIS — Z4789 Encounter for other orthopedic aftercare: Secondary | ICD-10-CM | POA: Diagnosis not present

## 2021-03-12 DIAGNOSIS — Z96651 Presence of right artificial knee joint: Secondary | ICD-10-CM | POA: Diagnosis not present

## 2021-03-12 DIAGNOSIS — T8484XA Pain due to internal orthopedic prosthetic devices, implants and grafts, initial encounter: Secondary | ICD-10-CM | POA: Diagnosis not present

## 2021-03-12 LAB — IRON AND TIBC
Iron: 86 ug/dL (ref 41–142)
Saturation Ratios: 39 % (ref 21–57)
TIBC: 223 ug/dL — ABNORMAL LOW (ref 236–444)
UIBC: 136 ug/dL (ref 120–384)

## 2021-03-12 LAB — FERRITIN: Ferritin: 2399 ng/mL — ABNORMAL HIGH (ref 11–307)

## 2021-03-26 DIAGNOSIS — R262 Difficulty in walking, not elsewhere classified: Secondary | ICD-10-CM | POA: Diagnosis not present

## 2021-03-26 DIAGNOSIS — M069 Rheumatoid arthritis, unspecified: Secondary | ICD-10-CM | POA: Diagnosis not present

## 2021-03-26 DIAGNOSIS — K219 Gastro-esophageal reflux disease without esophagitis: Secondary | ICD-10-CM | POA: Diagnosis not present

## 2021-03-26 DIAGNOSIS — I10 Essential (primary) hypertension: Secondary | ICD-10-CM | POA: Diagnosis not present

## 2021-04-01 DIAGNOSIS — M0579 Rheumatoid arthritis with rheumatoid factor of multiple sites without organ or systems involvement: Secondary | ICD-10-CM | POA: Diagnosis not present

## 2021-04-03 NOTE — Progress Notes (Signed)
Please place orders for PAT visit scheduled 04/08/21.

## 2021-04-03 NOTE — Patient Instructions (Addendum)
DUE TO COVID-19 ONLY ONE VISITOR IS ALLOWED TO COME WITH YOU AND STAY IN THE WAITING ROOM ONLY DURING PRE OP AND PROCEDURE.   **NO VISITORS ARE ALLOWED IN THE SHORT STAY AREA OR RECOVERY ROOM!!**  IF YOU WILL BE ADMITTED INTO THE HOSPITAL YOU ARE ALLOWED ONLY TWO SUPPORT PEOPLE DURING VISITATION HOURS ONLY (10AM -8PM)   The support person(s) may change daily. The support person(s) must pass our screening, gel in and out, and wear a mask at all times, including in the patient's room. Patients must also wear a mask when staff or their support person are in the room.  No visitors under the age of 39. Any visitor under the age of 33 must be accompanied by an adult.    COVID SWAB TESTING MUST BE COMPLETED ON:  04/10/21 **MUST PRESENT COMPLETED FORM AT TESTING SITE**    [facility] [place] (backside of the building) No appointment needed. Open 8am-3pm. You are not required to quarantine, however you are required to wear a well-fitted mask when you are out and around people not in your household.  Hand Hygiene often Do NOT share personal items Notify your provider if you are in close contact with someone who has COVID or you develop fever 100.4 or greater, new onset of sneezing, cough, sore throat, shortness of breath or body aches.       Your procedure is scheduled on: 04/14/21   Report to [facility] Main Entrance    Report to admitting at 11:15 AM   Call this number if you have problems the morning of surgery [phone]   Do not eat food :After Midnight.   May have liquids until 10:30 AM day of surgery  CLEAR LIQUID DIET  Foods Allowed                                                                     Foods Excluded  Water, Black Coffee and tea (no milk or creamer)           liquids that you cannot  Plain Jell-O in any flavor  (No red)                                    see through such as: Fruit ices (not with fruit pulp)                                            milk, soups, orange juice              Iced Popsicles (No red)                                               All solid food                                   Apple  juices Sports drinks like Gatorade (No red) Lightly seasoned clear broth or consume(fat free) Sugar     The day of surgery:  Drink ONE (1) Pre-Surgery Clear Ensure or G2 by am the morning of surgery. Drink in one sitting. Do not sip.  This drink was given to you during your hospital  pre-op appointment visit. Nothing else to drink after completing the  Pre-Surgery Clear Ensure or G2.          If you have questions, please contact your surgeon's office.     Oral Hygiene is also important to reduce your risk of infection.                                    Remember - BRUSH YOUR TEETH THE MORNING OF SURGERY WITH YOUR REGULAR TOOTHPASTE   Do NOT smoke after Midnight   Take these medicines the morning of surgery with A SIP OF WATER: Tylenol, Gabapentin, Oxycodone, Pantoprazole, Prednisone.                          You may not have any metal on your body including hair pins, jewelry, and body piercing             Do not wear make-up, lotions, powders, perfumes, or deodorant  Do not wear nail polish including gel and S&S, artificial/acrylic nails, or any other type of covering on natural nails including finger and toenails. If you have artificial nails, gel coating, etc. that needs to be removed by a nail salon please have this removed prior to surgery or surgery may need to be canceled/ delayed if the surgeon/ anesthesia feels like they are unable to be safely monitored.   Do not shave  48 hours prior to surgery.    Do not bring valuables to the hospital. [place].   Contacts, dentures or bridgework may not be worn into surgery.   Bring small overnight bag day of surgery.   Special Instructions: Bring a copy of your healthcare power of attorney and living will  documents         the day of surgery if you haven't scanned them before.   Please read over the following fact sheets you were given: IF YOU HAVE QUESTIONS ABOUT YOUR PRE-OP INSTRUCTIONS PLEASE CALL [facility] - Preparing for Surgery Before surgery, you can play an important role.  Because skin is not sterile, your skin needs to be as free of germs as possible.  You can reduce the number of germs on your skin by washing with CHG (chlorahexidine gluconate) soap before surgery.  CHG is an antiseptic cleaner which kills germs and bonds with the skin to continue killing germs even after washing. Please DO NOT use if you have an allergy to CHG or antibacterial soaps.  If your skin becomes reddened/irritated stop using the CHG and inform your nurse when you arrive at Short Stay. Do not shave (including legs and underarms) for at least 48 hours prior to the first CHG shower.  You may shave your face/neck.  Please follow these instructions carefully:  1.  Shower with CHG Soap the night before surgery and the  morning of surgery.  2.  If you choose to wash your hair, wash your  hair first as usual with your normal  shampoo.  3.  After you shampoo, rinse your hair and body thoroughly to remove the shampoo.                             4.  Use CHG as you would any other liquid soap.  You can apply chg directly to the skin and wash.  Gently with a scrungie or clean washcloth.  5.  Apply the CHG Soap to your body ONLY FROM THE NECK DOWN.   Do   not use on face/ open                           Wound or open sores. Avoid contact with eyes, ears mouth and   genitals (private parts).                       Wash face,  Genitals (private parts) with your normal soap.             6.  Wash thoroughly, paying special attention to the area where your    surgery  will be performed.  7.  Thoroughly rinse your body with warm water from the neck down.  8.  DO NOT shower/wash with your normal soap after  using and rinsing off the CHG Soap.                9.  Pat yourself dry with a clean towel.            10.  Wear clean pajamas.            11.  Place clean sheets on your bed the night of your first shower and do not  sleep with pets. Day of Surgery : Do not apply any lotions/deodorants the morning of surgery.  Please wear clean clothes to the hospital/surgery center.  FAILURE TO FOLLOW THESE INSTRUCTIONS MAY RESULT IN THE CANCELLATION OF YOUR SURGERY  PATIENT SIGNATURE_________________________________  NURSE SIGNATURE__________________________________  ________________________________________________________________________

## 2021-04-03 NOTE — Progress Notes (Addendum)
COVID swab appointment: 04/10/21  COVID Vaccine Completed: yes x4 Date COVID Vaccine completed: 08/19/19, 09/11/19 Has received booster: 05/05/20, 10/15/20 COVID vaccine manufacturer: Pfizer      Date of COVID positive in last 90 days:  PCP - Deland Pretty, MD Cardiologist -   Chest x-ray - 12/03/20 Epic EKG - 11/08/20 Epic Stress Test -  ECHO -  Cardiac Cath -  Pacemaker/ICD device last checked: Spinal Cord Stimulator:  Sleep Study -  CPAP -   Fasting Blood Sugar -  Checks Blood Sugar _____ times a day  Blood Thinner Instructions: Aspirin Instructions: Last Dose:  Activity level:  Can go up a flight of stairs and perform activities of daily living without stopping and without symptoms of chest pain or shortness of breath.   Able to exercise without symptoms  Unable to go up a flight of stairs without symptoms of      Anesthesia review:   Patient denies shortness of breath, fever, cough and chest pain at PAT appointment   Patient verbalized understanding of instructions that were given to them at the PAT appointment. Patient was also instructed that they will need to review over the PAT instructions again at home before surgery.

## 2021-04-04 DIAGNOSIS — K219 Gastro-esophageal reflux disease without esophagitis: Secondary | ICD-10-CM | POA: Diagnosis not present

## 2021-04-04 DIAGNOSIS — R262 Difficulty in walking, not elsewhere classified: Secondary | ICD-10-CM | POA: Diagnosis not present

## 2021-04-04 DIAGNOSIS — M069 Rheumatoid arthritis, unspecified: Secondary | ICD-10-CM | POA: Diagnosis not present

## 2021-04-04 DIAGNOSIS — I10 Essential (primary) hypertension: Secondary | ICD-10-CM | POA: Diagnosis not present

## 2021-04-08 ENCOUNTER — Encounter (HOSPITAL_COMMUNITY)
Admission: RE | Admit: 2021-04-08 | Discharge: 2021-04-08 | Disposition: A | Discharge status: Home / Self Care | Payer: Medicare Other | Source: Ambulatory Visit

## 2021-04-08 ENCOUNTER — Emergency Department (HOSPITAL_COMMUNITY): Payer: Medicare Other

## 2021-04-08 ENCOUNTER — Encounter (HOSPITAL_COMMUNITY): Payer: Self-pay | Admitting: *Deleted

## 2021-04-08 ENCOUNTER — Emergency Department (HOSPITAL_COMMUNITY)
Admission: EM | Admit: 2021-04-08 | Discharge: 2021-04-08 | Disposition: A | Discharge status: Home / Self Care | Payer: Medicare Other | Attending: Emergency Medicine | Admitting: Emergency Medicine

## 2021-04-08 DIAGNOSIS — Z01812 Encounter for preprocedural laboratory examination: Secondary | ICD-10-CM | POA: Insufficient documentation

## 2021-04-08 DIAGNOSIS — R0902 Hypoxemia: Secondary | ICD-10-CM | POA: Diagnosis not present

## 2021-04-08 DIAGNOSIS — Z79899 Other long term (current) drug therapy: Secondary | ICD-10-CM | POA: Insufficient documentation

## 2021-04-08 DIAGNOSIS — I129 Hypertensive chronic kidney disease with stage 1 through stage 4 chronic kidney disease, or unspecified chronic kidney disease: Secondary | ICD-10-CM | POA: Diagnosis not present

## 2021-04-08 DIAGNOSIS — Z96653 Presence of artificial knee joint, bilateral: Secondary | ICD-10-CM | POA: Insufficient documentation

## 2021-04-08 DIAGNOSIS — N183 Chronic kidney disease, stage 3 unspecified: Secondary | ICD-10-CM | POA: Diagnosis not present

## 2021-04-08 DIAGNOSIS — I10 Essential (primary) hypertension: Secondary | ICD-10-CM | POA: Diagnosis not present

## 2021-04-08 DIAGNOSIS — M25561 Pain in right knee: Secondary | ICD-10-CM | POA: Diagnosis not present

## 2021-04-08 DIAGNOSIS — Z96651 Presence of right artificial knee joint: Secondary | ICD-10-CM | POA: Diagnosis not present

## 2021-04-08 NOTE — ED Provider Notes (Signed)
[facility] DEPT Provider Note   CSN: [id number] Arrival date & time: 04/08/21  1319     History Chief Complaint  Patient presents with  . Knee Pain    Helen Hardy is a 70 y.o. female with past medical history of hypertension, rheumatoid arthritis, who presents today for evaluation of right knee pain.  She was scheduled for preop appointment today and while getting ready and moving for that she felt like her knee locked up.  She is able to move it however reports pain with it.  That is not abnormal for her.  She did not fall, denies any other new pains or concerns other than missing her preop appointment.  She is scheduled for a right knee replacement.  HPI     Past Medical History:  Diagnosis Date  . Anemia   . Anemia of chronic renal failure, stage 3 (moderate) ([place]) 12/30/2018   pt denies  . GERD (gastroesophageal reflux disease)   . History of ischemic colitis    Hospitalized  . Hypertension   . 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 Non Hodgkin's lymphoma ([place])   . Numbness and tingling of right leg   . Pneumonia    x2  . RA (rheumatoid arthritis) [facility])     Patient Active Problem List   Diagnosis Date Noted  . 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 GI bleed 03/25/2020  . SIRS (systemic inflammatory response syndrome) ([place]) 03/25/2020  . 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 Rheumatoid arthritis ([place]) 12/25/2019  . GERD (gastroesophageal reflux disease) 12/25/2019  . Essential hypertension 12/25/2019  . AKI (acute kidney injury) ([place]) 12/25/2019  . Hyperglycemia 12/25/2019  . Chronic radicular lumbar pain 12/25/2019  . Generalized weakness 12/25/2019  . Anemia of chronic renal failure, stage 3 (moderate) ([place]) 12/30/2018  . 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 Iron deficiency anemia 06/21/2015  . 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 Morbid obesity ([place]) 04/10/2015  . 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 S/P knee replacement 04/08/2015    Past Surgical History:  Procedure Laterality Date  . 2012 Right big toe and toe beside - had pins in toes    . ABDOMINAL HYSTERECTOMY    . BIOPSY  04/01/2020   Procedure: BIOPSY;  Surgeon: Juanita Craver, MD;  Location: [facility] END[facility]COPY;  Service: Gastroenterology;;  . bladder tack - 2005    . COLONOSCOPY WITH PROPOFOL N/A 04/01/2020   Procedure: COLONOSCOPY WITH PROPOFOL;  Surgeon: Juanita Craver, MD;  Location: [facility];  Service: Gastroenterology;  Laterality: N/A;  . EYE SURGERY Bilateral    cataract  . JOINT REPLACEMENT Bilateral 1995, 1996   KNEE  . LUMBAR LAMINECTOMY/DECOMPRESSION MICRODISCECTOMY N/A 03/04/2016   Procedure: RIGHT FAR LATERAL DISCECTOMY L5-S1;  Surgeon: Melina Schools, MD;  Location: [facility];  Service: Orthopedics;  Laterality: N/A;  . PORTACATH PLACEMENT    . PORTACATH REMOVAL    . ROTATOR CUFF REPAIR Left   . TOTAL KNEE REVISION Left 04/08/2015   Procedure: REVISION LEFT TOTAL KNEE ;  Surgeon: Paralee Cancel, MD;  Location: [facility];  Service: Orthopedics;  Laterality: Left;  . TOTAL KNEE REVISION Right 12/12/2020   Procedure: TOTAL KNEE REVISION;  Surgeon: Paralee Cancel, MD;  Location: [facility];  Service: Orthopedics;  Laterality: Right;     OB History   No obstetric history on file.     No family history on file.  Social History   Tobacco Use  .  Smoking status: Never  . Smokeless tobacco: Never  Vaping Use  . Vaping Use: Never used  Substance Use Topics  . Alcohol use: No    Alcohol/week: 0.0 standard drinks  . Drug use: No    Home Medications Prior to Admission medications   Medication Sig Start Date End Date Taking? Authorizing Provider  acetaminophen (TYLENOL) 500 MG tablet Take 2 tablets (1,000 mg total) by mouth every 8 (eight) hours. Patient not taking: No sig reported 12/17/20   Irving Copas, PA-C  acetaminophen (TYLENOL) 650 MG CR tablet Take 650 mg by mouth every 8 (eight) hours as needed  for pain.    [provider]  Ascorbic Acid (VITAMIN C) 1000 MG tablet Take 1,000 mg by mouth in the morning.    [provider]  cholecalciferol (VITAMIN D) 25 MCG (1000 UNIT) tablet Take 1,000 Units by mouth in the morning.    [provider]  docusate sodium (COLACE) 100 MG capsule Take 1 capsule (100 mg total) by mouth 2 (two) times daily. Patient taking differently: Take 100 mg by mouth in the morning. 12/17/20   Irving Copas, PA-C  estradiol (ESTRACE) 1 MG tablet Take 1 mg by mouth in the morning. 12/25/12   [provider]  folic acid (FOLVITE) 1 MG tablet Take 2 mg by mouth in the morning.    [provider]  furosemide (LASIX) 40 MG tablet Take 40 mg by mouth in the morning. 04/22/20   [provider]  gabapentin (NEURONTIN) 300 MG capsule Take 300-600 mg by mouth See admin instructions. Take 1 capsule (300 mg) by mouth in the morning, take 1 capsule (300 mg) by mouth in the afternoon & take 2 capsules (600 mg) by mouth at night. 03/17/18   [provider]  leflunomide (ARAVA) 20 MG tablet Take 20 mg by mouth in the morning.    [provider]  methocarbamol (ROBAXIN) 500 MG tablet Take 1 tablet (500 mg total) by mouth every 6 (six) hours as needed for muscle spasms. Patient taking differently: Take 500 mg by mouth 3 (three) times daily as needed for muscle spasms. 12/17/20   Irving Copas, PA-C  Multiple Vitamin (MULTIVITAMIN WITH MINERALS) TABS tablet Take 1 tablet by mouth in the morning.    [provider]  olmesartan (BENICAR) 20 MG tablet Take 20 mg by mouth in the morning. 12/27/18   [provider]  ondansetron (ZOFRAN) 4 MG tablet Take 4 mg by mouth every 8 (eight) hours as needed for nausea or vomiting.  04/10/19   [provider]  oxyCODONE 10 MG TABS Take 1 tablet (10 mg total) by mouth every 4 (four) hours as needed for severe pain (pain score 7-10). Patient taking differently:  Take 10 mg by mouth 4 (four) times daily as needed for severe pain (pain score 7-10). 12/17/20   Irving Copas, PA-C  pantoprazole (PROTONIX) 40 MG tablet Take 40 mg by mouth in the morning.    [provider]  polyethylene glycol (MIRALAX / GLYCOLAX) 17 g packet Take 17 g by mouth daily as needed for mild constipation. Patient not taking: No sig reported 12/17/20   Irving Copas, PA-C  Potassium Chloride ER 20 MEQ TBCR Take 20 mEq by mouth in the morning. 05/09/20   [provider]  predniSONE (DELTASONE) 10 MG tablet Take 10 mg by mouth daily with breakfast. 04/12/18   [provider]  vitamin B-12 (CYANOCOBALAMIN) 1000 MCG tablet  Take 1,000 mcg by mouth in the morning.    [provider]    Allergies    Oxycodone and Tramadol  Review of Systems   Review of Systems  Constitutional:  Negative for chills and fever.  Respiratory:  Negative for shortness of breath.   Cardiovascular:  Negative for chest pain.  Gastrointestinal:  Negative for abdominal pain.  Musculoskeletal:  Positive for joint swelling.       Right knee pain  Skin:  Negative for color change and wound.  Neurological:  Negative for weakness.  All other systems reviewed and are negative.  Physical Exam Updated Vital Signs BP (!) 184/129 (BP Location: Left Arm)   Pulse 82   Temp 99.1 F (37.3 C) (Oral)   Resp 16   SpO2 95%   Physical Exam Vitals and nursing note reviewed.  Constitutional:      General: She is not in acute distress.    Appearance: She is not ill-appearing.  HENT:     Head: Normocephalic.  Cardiovascular:     Rate and Rhythm: Normal rate.  Pulmonary:     Effort: Pulmonary effort is normal. No respiratory distress.     Comments: 2+ right DP pulse.  Right foot is warm. Musculoskeletal:     Comments: Chronic deformity of right ankle/foot.  Right knee has surgical midline scar that is well-healed.  Exam is limited secondary to body habitus however she is able  to actively flex and extend her right knee however this does cause pain.  Knee appears grossly stable on exam.  Knee is diffusely tender anteriorly, posteriorly, medially and laterally.  No deformities palpated.  Compartments in right upper and lower leg are soft and easily compressible.  Skin:    Comments: No skin breaks or tears visualized over the right knee  Neurological:     Mental Status: She is alert.     Sensory: No sensory deficit (Sensation intact to light touch over right knee.).    ED Results / Procedures / Treatments   Labs (all labs ordered are listed, but only abnormal results are displayed) Labs Reviewed - No data to display  EKG None  Radiology DG Knee Right Port  Result Date: 04/08/2021 CLINICAL DATA:  Right knee pain.  Preoperative exam EXAM: PORTABLE RIGHT KNEE - 1-2 VIEW COMPARISON:  02/21/2021 FINDINGS: Status post right total knee arthroplasty revision with long stem tibial and femoral components. Unchanged hardware alignment compared to prior, with the tibial component appearing slightly proud. Patella Alta alignment. No periprosthetic fracture. Probable small joint effusion. Nonspecific soft tissue calcifications anterior to the knee. Diffuse soft tissue prominence. IMPRESSION: Unchanged hardware alignment status post right total knee arthroplasty revision. No periprosthetic fracture. Electronically Signed   By: Davina Poke D.O.   On: 04/08/2021 14:46    Procedures Procedures   Medications Ordered in ED Medications - No data to display  ED Course  I have reviewed the triage vital signs and the nursing notes.  Pertinent labs & imaging results that were available during my care of the patient were reviewed by me and considered in my medical decision making (see chart for details).    MDM Rules/Calculators/A&P                          -Patient is a 70 year old woman who presents today for evaluation of isolated acute on chronic right knee pain.  She is  scheduled for a knee replacement next week and  while getting ready for her preop appointment today felt like her knee locked up.  She is neurovascularly intact on exam.  She does have baseline deformity in the right foot and ankle per her report and this is not painful.  X-rays were obtained showing unchanged hardware alignment, with a probable small joint effusion.  Her knee appears grossly stable on exam, she is able to flex and extend her knee. Fall prevention was discussed with patient and her husband at bedside. She has all the equipment that she needs, stating that she has a brace, walker, lift chair and does not need any DME or medications.  She is instructed to call pre-op for new appointment.    Return precautions were discussed with patient who states their understanding.  At the time of discharge patient denied any unaddressed complaints or concerns.  Patient is agreeable for discharge home.  Note: Portions of this report may have been transcribed using voice recognition software. Every effort was made to ensure accuracy; however, inadvertent computerized transcription errors may be present  This patient was seen as a shared visit with Dr. Roslynn Amble.    Final Clinical Impression(s) / ED Diagnoses Final diagnoses:  Knee pain, right    Rx / DC Orders ED Discharge Orders     None        Lorin Glass, PA-C 04/08/21 1606    Lucrezia Starch, MD 04/08/21 [phone]

## 2021-04-08 NOTE — ED Provider Notes (Signed)
Emergency Medicine Provider Triage Evaluation Note  Helen Hardy , a 70 y.o. female  was evaluated in triage.  Pt complains of knee pain.  She was scheduled for a preop appointment today at 1 PM and states that she felt like her knee locked up.  She states that it feels better now however does not feel back to how it was prior.  She denies any specific injury..  Review of Systems  Positive: Knee pain Negative: Fever, wound  Physical Exam  BP (!) 184/129 (BP Location: Left Arm)   Pulse 82   Temp 99.1 F (37.3 C) (Oral)   Resp 16   SpO2 95%  Gen:   Awake, no distress   Resp:  Normal effort  MSK:   Moves extremities without difficulty, chronic deformity of right foot.  Knee exam limited by pain, body habitus and patient positioning.  Other:  2+ dp pulse in right foot.   Medical Decision Making  Medically screening exam initiated at 1:47 PM.  Appropriate orders placed.  Helen Hardy was informed that the remainder of the evaluation will be completed by another provider, this initial triage assessment does not replace that evaluation, and the importance of remaining in the ED until their evaluation is complete.     Lorin Glass, PA-C 04/08/21 [place], MD 04/18/21 [phone]

## 2021-04-08 NOTE — Progress Notes (Signed)
Patient called to inform that she is on her way to hospital for her knee "collapsing". She will not be making her appointment.

## 2021-04-08 NOTE — ED Triage Notes (Signed)
Per EMS-patient has a pre op appointment today at 1 pm-last knee replacement was in [place] cap has a 'crack" in it-having it replaced on Monday-states it is now dislocated that is why she is here

## 2021-04-08 NOTE — Discharge Instructions (Addendum)
Please call to reschedule your preoperative appointment.  When you get home I would recommend putting on your knee brace and using your walker.  It is important that you avoid any falls.

## 2021-04-09 ENCOUNTER — Inpatient Hospital Stay: Payer: Medicare Other

## 2021-04-09 ENCOUNTER — Other Ambulatory Visit: Payer: Self-pay

## 2021-04-09 ENCOUNTER — Inpatient Hospital Stay: Payer: Medicare Other | Attending: Hematology & Oncology

## 2021-04-09 ENCOUNTER — Inpatient Hospital Stay (HOSPITAL_BASED_OUTPATIENT_CLINIC_OR_DEPARTMENT_OTHER): Payer: Medicare Other | Admitting: Hematology & Oncology

## 2021-04-09 ENCOUNTER — Encounter: Payer: Self-pay | Admitting: Hematology & Oncology

## 2021-04-09 ENCOUNTER — Other Ambulatory Visit: Payer: Self-pay | Admitting: *Deleted

## 2021-04-09 VITALS — BP 135/91 | HR 73 | Temp 98.0°F | Resp 19

## 2021-04-09 DIAGNOSIS — Z8639 Personal history of other endocrine, nutritional and metabolic disease: Secondary | ICD-10-CM | POA: Diagnosis not present

## 2021-04-09 DIAGNOSIS — I129 Hypertensive chronic kidney disease with stage 1 through stage 4 chronic kidney disease, or unspecified chronic kidney disease: Secondary | ICD-10-CM | POA: Insufficient documentation

## 2021-04-09 DIAGNOSIS — N1831 Chronic kidney disease, stage 3a: Secondary | ICD-10-CM

## 2021-04-09 DIAGNOSIS — Z8572 Personal history of non-Hodgkin lymphomas: Secondary | ICD-10-CM | POA: Insufficient documentation

## 2021-04-09 DIAGNOSIS — K909 Intestinal malabsorption, unspecified: Secondary | ICD-10-CM

## 2021-04-09 DIAGNOSIS — D631 Anemia in chronic kidney disease: Secondary | ICD-10-CM

## 2021-04-09 DIAGNOSIS — Z79899 Other long term (current) drug therapy: Secondary | ICD-10-CM | POA: Diagnosis not present

## 2021-04-09 DIAGNOSIS — D500 Iron deficiency anemia secondary to blood loss (chronic): Secondary | ICD-10-CM

## 2021-04-09 DIAGNOSIS — N183 Chronic kidney disease, stage 3 unspecified: Secondary | ICD-10-CM

## 2021-04-09 DIAGNOSIS — N189 Chronic kidney disease, unspecified: Secondary | ICD-10-CM | POA: Diagnosis not present

## 2021-04-09 LAB — CMP (CANCER CENTER ONLY)
ALT: 9 U/L (ref 0–44)
AST: 14 U/L — ABNORMAL LOW (ref 15–41)
Albumin: 3.8 g/dL (ref 3.5–5.0)
Alkaline Phosphatase: 50 U/L (ref 38–126)
Anion gap: 8 (ref 5–15)
BUN: 18 mg/dL (ref 8–23)
CO2: 28 mmol/L (ref 22–32)
Calcium: 9.3 mg/dL (ref 8.9–10.3)
Chloride: 108 mmol/L (ref 98–111)
Creatinine: 1.06 mg/dL — ABNORMAL HIGH (ref 0.44–1.00)
GFR, Estimated: 56 mL/min — ABNORMAL LOW (ref >60)
Glucose, Bld: 113 mg/dL — ABNORMAL HIGH (ref 70–99)
Potassium: 4.6 mmol/L (ref 3.5–5.1)
Sodium: 144 mmol/L (ref 135–145)
Total Bilirubin: 0.4 mg/dL (ref 0.3–1.2)
Total Protein: 5.9 g/dL — ABNORMAL LOW (ref 6.5–8.1)

## 2021-04-09 LAB — CBC WITH DIFFERENTIAL (CANCER CENTER ONLY)
Abs Immature Granulocytes: 0.03 10*3/uL (ref 0.00–0.07)
Basophils Absolute: 0 10*3/uL (ref 0.0–0.1)
Basophils Relative: 0 %
Eosinophils Absolute: 0 10*3/uL (ref 0.0–0.5)
Eosinophils Relative: 0 %
HCT: 28.6 % — ABNORMAL LOW (ref 36.0–46.0)
Hemoglobin: 9.3 g/dL — ABNORMAL LOW (ref 12.0–15.0)
Immature Granulocytes: 1 %
Lymphocytes Relative: 43 %
Lymphs Abs: 2 10*3/uL (ref 0.7–4.0)
MCH: 36.6 pg — ABNORMAL HIGH (ref 26.0–34.0)
MCHC: 32.5 g/dL (ref 30.0–36.0)
MCV: 112.6 fL — ABNORMAL HIGH (ref 80.0–100.0)
Monocytes Absolute: 0.3 10*3/uL (ref 0.1–1.0)
Monocytes Relative: 7 %
Neutro Abs: 2.3 10*3/uL (ref 1.7–7.7)
Neutrophils Relative %: 49 %
Platelet Count: 120 10*3/uL — ABNORMAL LOW (ref 150–400)
RBC: 2.54 MIL/uL — ABNORMAL LOW (ref 3.87–5.11)
RDW: 15.3 % (ref 11.5–15.5)
WBC Count: 4.6 10*3/uL (ref 4.0–10.5)
nRBC: 0 % (ref 0.0–0.2)

## 2021-04-09 LAB — RETICULOCYTES
Immature Retic Fract: 31.3 % — ABNORMAL HIGH (ref 2.3–15.9)
RBC.: 2.56 MIL/uL — ABNORMAL LOW (ref 3.87–5.11)
Retic Count, Absolute: 75.3 10*3/uL (ref 19.0–186.0)
Retic Ct Pct: 2.9 % (ref 0.4–3.1)

## 2021-04-09 LAB — PREPARE RBC (CROSSMATCH)

## 2021-04-09 MED ORDER — EPOETIN ALFA-EPBX 40000 UNIT/ML IJ SOLN
40000.0000 [IU] | Freq: Once | INTRAMUSCULAR | Status: AC
  Administered 2021-04-09: 40000 [IU] via SUBCUTANEOUS
  Filled 2021-04-09: qty 1

## 2021-04-09 NOTE — Progress Notes (Signed)
Hematology and Oncology Follow Up Visit  Helen Hardy [id number] 05-20-1951 70 y.o. 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  The problem now is that she needs to have a total revision of the right knee.  This will be on Monday.  I worry that this can be a very bloody procedure.  Her hemoglobin is 9.3 today.  I really think we are going to have to transfuse her 1 unit to get her blood little bit higher so that she can withstand blood loss.  Otherwise, she is about the same.  She really has her problems with arthritis.  She not able to really walk all that well.  She has had no fever.  There is been no problems with diarrhea.  She had I think diverticulitis/colitis in the hospital.  She was in the hospital for several weeks.  She has had no obvious bleeding.  Overall, I would have to say that her performance status is probably ECOG 2.    Medications:  Allergies as of 04/09/2021       Reactions   Oxycodone Nausea And Vomiting   Able tolerate with antinausea medicine   Tramadol Other (See Comments)   UNKNOWN REACTION        Medication List        Accurate as of April 09, 2021  3:08 PM. If you have any questions, ask your nurse or doctor.          acetaminophen 650 MG CR tablet Commonly known as: TYLENOL Take 650 mg by mouth every 8 (eight) hours as needed for pain.   acetaminophen 500 MG tablet Commonly known as: TYLENOL Take 2 tablets (1,000 mg total) by mouth every 8 (eight) hours.   cholecalciferol 25 MCG (1000 UNIT) tablet Commonly known as: VITAMIN D Take 1,000 Units by mouth in the morning.   docusate sodium 100 MG capsule Commonly  known as: COLACE Take 1 capsule (100 mg total) by mouth 2 (two) times daily. What changed: when to take this   estradiol 1 MG tablet Commonly known as: ESTRACE Take 1 mg by mouth in the morning.   folic acid 1 MG tablet Commonly known as: FOLVITE Take 2 mg by mouth in the morning.   furosemide 40 MG tablet Commonly known as: LASIX Take 40 mg by mouth in the morning.   gabapentin 300 MG capsule Commonly known as: NEURONTIN Take 300-600 mg by mouth See admin instructions. Take 1 capsule (300 mg) by mouth in the morning, take 1 capsule (300 mg) by mouth in the afternoon & take 2 capsules (600 mg) by mouth at night.   leflunomide 20 MG tablet Commonly known as: ARAVA Take 20 mg by mouth in the morning.   methocarbamol 500 MG tablet Commonly known as: ROBAXIN Take 1 tablet (500 mg total) by mouth every 6 (six) hours as needed for muscle spasms. What changed: when to take this   multivitamin with minerals Tabs tablet Take 1 tablet by mouth in the morning.   olmesartan 20 MG tablet Commonly known as: BENICAR Take 20 mg by mouth in the morning.   ondansetron 4 MG tablet Commonly  known as: ZOFRAN Take 4 mg by mouth every 8 (eight) hours as needed for nausea or vomiting.   Oxycodone HCl 10 MG Tabs Take 1 tablet (10 mg total) by mouth every 4 (four) hours as needed for severe pain (pain score 7-10). What changed: when to take this   pantoprazole 40 MG tablet Commonly known as: PROTONIX Take 40 mg by mouth in the morning.   polyethylene glycol 17 g packet Commonly known as: MIRALAX / GLYCOLAX Take 17 g by mouth daily as needed for mild constipation.   Potassium Chloride ER 20 MEQ Tbcr Take 20 mEq by mouth in the morning.   predniSONE 10 MG tablet Commonly known as: DELTASONE Take 10 mg by mouth daily with breakfast.   vitamin B-12 1000 MCG tablet Commonly known as: CYANOCOBALAMIN Take 1,000 mcg by mouth in the morning.   vitamin C 1000 MG tablet Take 1,000 mg by  mouth in the morning.        Allergies:  Allergies  Allergen Reactions   Oxycodone Nausea And Vomiting    Able tolerate with antinausea medicine   Tramadol Other (See Comments)    UNKNOWN REACTION    Past Medical History, Surgical history, Social history, and Family History were reviewed and updated.  Review of Systems: Review of Systems  HENT: Negative.    Eyes: Negative.   Respiratory:  Positive for shortness of breath.   Cardiovascular:  Positive for leg swelling.  Gastrointestinal:  Positive for nausea.  Genitourinary: Negative.   Musculoskeletal:  Positive for falls, joint pain, myalgias and neck pain.  Skin: Negative.   Neurological: Negative.   Endo/Heme/Allergies: Negative.   Psychiatric/Behavioral: Negative.      Physical Exam:  oral temperature is 98 F (36.7 C). Her blood pressure is 135/91 (abnormal) and her pulse is 73. Her respiration is 19 and oxygen saturation is 98%.   Wt Readings from Last 3 Encounters:  03/11/21 206 lb (93.4 kg)  02/21/21 206 lb (93.4 kg)  12/12/20 216 lb 6.4 oz (98.2 kg)    Physical Exam Vitals reviewed.  HENT:     Head: Normocephalic and atraumatic.  Eyes:     Pupils: Pupils are equal, round, and reactive to light.  Cardiovascular:     Rate and Rhythm: Normal rate and regular rhythm.     Heart sounds: Normal heart sounds.  Pulmonary:     Effort: Pulmonary effort is normal.     Breath sounds: Normal breath sounds.  Abdominal:     General: Bowel sounds are normal.     Palpations: Abdomen is soft.  Musculoskeletal:        General: No tenderness or deformity. Normal range of motion.     Cervical back: Normal range of motion.     Comments: Extremities shows significant osteoarthritic and rheumatoid arthritis changes.  She has multiple surgical scars.  She has swelling with her knees.  Again, she has significant changes of arthritis.  Lymphadenopathy:     Cervical: No cervical adenopathy.  Skin:    General: Skin is warm  and dry.     Findings: No erythema or rash.  Neurological:     Mental Status: She is alert and oriented to person, place, and time.  Psychiatric:        Behavior: Behavior normal.        Thought Content: Thought content normal.        Judgment: Judgment normal.    Lab Results  Component Value Date   WBC  4.6 04/09/2021   HGB 9.3 (L) 04/09/2021   HCT 28.6 (L) 04/09/2021   MCV 112.6 (H) 04/09/2021   PLT 120 (L) 04/09/2021   Lab Results  Component Value Date   FERRITIN 2,399 (H) 03/11/2021   IRON 86 03/11/2021   TIBC 223 (L) 03/11/2021   UIBC 136 03/11/2021   IRONPCTSAT 39 03/11/2021   Lab Results  Component Value Date   RETICCTPCT 2.9 04/09/2021   RBC 2.54 (L) 04/09/2021   RBC 2.56 (L) 04/09/2021   RETICCTABS 41.0 06/21/2015   No results found for: KPAFRELGTCHN, LAMBDASER, KAPLAMBRATIO No results found for: IGGSERUM, IGA, IGMSERUM No results found for: Odetta Pink, SPEI   Chemistry      Component Value Date/Time   NA 144 04/09/2021 1348   NA 144 04/29/2017 0929   K 4.6 04/09/2021 1348   K 4.0 04/29/2017 0929   CL 108 04/09/2021 1348   CL 103 06/21/2015 0800   CO2 28 04/09/2021 1348   CO2 25 04/29/2017 0929   BUN 18 04/09/2021 1348   BUN 17.7 04/29/2017 0929   CREATININE 1.06 (H) 04/09/2021 1348   CREATININE 0.8 04/29/2017 0929      Component Value Date/Time   CALCIUM 9.3 04/09/2021 1348   CALCIUM 9.3 04/29/2017 0929   ALKPHOS 50 04/09/2021 1348   ALKPHOS 55 04/29/2017 0929   AST 14 (L) 04/09/2021 1348   AST 19 04/29/2017 0929   ALT 9 04/09/2021 1348   ALT 16 04/29/2017 0929   BILITOT 0.4 04/09/2021 1348   BILITOT 0.43 04/29/2017 0929       Impression and Plan: Ms. Jachim is a very pleasant 70 yo African American female with 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 She has multifactorial anemia.   Again, the surgery is can be a potential issue.  From my point of  view, she really needs to have her blood count higher.  We will give her 1 unit of blood on Friday.  This should really help with the surgery on Monday.  She will get Retacrit today.  We will see what her studies show with iron.  Not sure we will probably see her in the hospital.  We will try to get her back to see [place] in about 6 weeks.  I do not want to get her back to soon which could be difficult for her to get here.  Volanda Napoleon, MD 10/5/20223:08 PM

## 2021-04-09 NOTE — Patient Instructions (Signed)

## 2021-04-09 NOTE — H&P (Signed)
TOTAL KNEE REVISION ADMISSION H&P  Patient is being admitted for right revision total knee arthroplasty.  Subjective:  Chief Complaint:right knee pain.  HPI: Helen Hardy, 70 y.o. female, has a history of pain and functional disability in the right knee(s) due to failed previous arthroplasty and patient has failed non-surgical conservative treatments for greater than 12 weeks to include NSAID's and/or analgesics, use of assistive devices, and activity modification. She underwent right total knee arthroplasty revision on 12/12/20 due to aseptic loosening. Unfortunately, at her 3 month post op appointment she was having issues with extensor lag and persistent pain. Dr. Alvan Dame discussed with her concerns about possible extensor mechanism disruption and recommendations for revision knee surgery to evaluate the extensor mechanism.There is no current active infection.  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   Cecal diverticulitis 12/25/2019   Rheumatoid arthritis ([place]) 12/25/2019   GERD (gastroesophageal reflux disease) 12/25/2019   Essential hypertension 12/25/2019   AKI (acute kidney injury) ([place]) 12/25/2019   Hyperglycemia 12/25/2019   Chronic radicular lumbar pain 12/25/2019   Generalized weakness 12/25/2019   Anemia of chronic renal failure, stage 3 (moderate) ([facility]) 12/30/2018   Lumbar disc herniation 03/04/2016   Iron deficiency anemia 06/21/2015   Malabsorption of iron 06/21/2015   Morbid obesity ([place]) 04/10/2015   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   S/P knee replacement 04/08/2015   Past Medical History:  Diagnosis Date   Anemia    Anemia of chronic renal failure, stage 3 (moderate) ([place]) 12/30/2018   pt denies   GERD (gastroesophageal reflux disease)    History of ischemic colitis    Hospitalized    Hypertension    Malabsorption of iron 06/21/2015   Non Hodgkin's lymphoma (HCC)    Numbness and tingling of right leg    Pneumonia    x2   RA (rheumatoid arthritis) ([place])     Past Surgical History:  Procedure Laterality Date   2012 Right big toe and toe beside - had pins in toes     ABDOMINAL HYSTERECTOMY     BIOPSY  04/01/2020   Procedure: BIOPSY;  Surgeon: Juanita Craver, MD;  Location: [facility];  Service: Gastroenterology;;   bladder tack - 2005     COLONOSCOPY WITH PROPOFOL N/A 04/01/2020   Procedure: COLONOSCOPY WITH PROPOFOL;  Surgeon: Juanita Craver, MD;  Location: [facility];  Service: Gastroenterology;  Laterality: N/A;   EYE SURGERY Bilateral    cataract   JOINT REPLACEMENT Bilateral [place] MICRODISCECTOMY N/A 03/04/2016   Procedure: RIGHT FAR LATERAL DISCECTOMY L5-S1;  Surgeon: Melina Schools, MD;  Location: [facility];  Service: Orthopedics;  Laterality: N/A;   PORTACATH PLACEMENT     PORTACATH REMOVAL     ROTATOR CUFF REPAIR Left    TOTAL KNEE REVISION Left 04/08/2015   Procedure: REVISION LEFT TOTAL KNEE ;  Surgeon: Paralee Cancel, MD;  Location: [facility];  Service: Orthopedics;  Laterality: Left;   TOTAL KNEE REVISION Right 12/12/2020   Procedure: TOTAL KNEE REVISION;  Surgeon: Paralee Cancel, MD;  Location: [facility];  Service: Orthopedics;  Laterality: Right;    No current facility-administered medications for this encounter.   Current Outpatient Medications  Medication Sig Dispense Refill Last Dose   acetaminophen (TYLENOL) 650 MG CR tablet Take 650 mg by mouth  every 8 (eight) hours as needed for pain.      Ascorbic Acid (VITAMIN C) 1000 MG tablet Take 1,000 mg by mouth in the morning.      cholecalciferol (VITAMIN D) 25 MCG (1000 UNIT) tablet Take 1,000 Units by mouth in the morning.      docusate sodium (COLACE) 100 MG capsule Take 1 capsule (100 mg total) by mouth 2 (two) times daily. (Patient taking differently: Take 100 mg by  mouth in the morning.) 10 capsule 0    estradiol (ESTRACE) 1 MG tablet Take 1 mg by mouth in the morning.      folic acid (FOLVITE) 1 MG tablet Take 2 mg by mouth in the morning.      furosemide (LASIX) 40 MG tablet Take 40 mg by mouth in the morning.      gabapentin (NEURONTIN) 300 MG capsule Take 300-600 mg by mouth See admin instructions. Take 1 capsule (300 mg) by mouth in the morning, take 1 capsule (300 mg) by mouth in the afternoon & take 2 capsules (600 mg) by mouth at night.  3    leflunomide (ARAVA) 20 MG tablet Take 20 mg by mouth in the morning.      methocarbamol (ROBAXIN) 500 MG tablet Take 1 tablet (500 mg total) by mouth every 6 (six) hours as needed for muscle spasms. (Patient taking differently: Take 500 mg by mouth 3 (three) times daily as needed for muscle spasms.) 40 tablet 0    Multiple Vitamin (MULTIVITAMIN WITH MINERALS) TABS tablet Take 1 tablet by mouth in the morning.      olmesartan (BENICAR) 20 MG tablet Take 20 mg by mouth in the morning.      ondansetron (ZOFRAN) 4 MG tablet Take 4 mg by mouth every 8 (eight) hours as needed for nausea or vomiting.       oxyCODONE 10 MG TABS Take 1 tablet (10 mg total) by mouth every 4 (four) hours as needed for severe pain (pain score 7-10). (Patient taking differently: Take 10 mg by mouth 4 (four) times daily as needed for severe pain (pain score 7-10).) 42 tablet 0    pantoprazole (PROTONIX) 40 MG tablet Take 40 mg by mouth in the morning.      Potassium Chloride ER 20 MEQ TBCR Take 20 mEq by mouth in the morning.      predniSONE (DELTASONE) 10 MG tablet Take 10 mg by mouth daily with breakfast.  2    vitamin B-12 (CYANOCOBALAMIN) 1000 MCG tablet Take 1,000 mcg by mouth in the morning.      acetaminophen (TYLENOL) 500 MG tablet Take 2 tablets (1,000 mg total) by mouth every 8 (eight) hours. (Patient not taking: No sig reported) 30 tablet 0 Not Taking   polyethylene glycol (MIRALAX / GLYCOLAX) 17 g packet Take 17 g by mouth daily as  needed for mild constipation. (Patient not taking: No sig reported) 14 each 0 Not Taking   Allergies  Allergen Reactions   Oxycodone Nausea And Vomiting    Able tolerate with antinausea medicine   Tramadol Other (See Comments)    UNKNOWN REACTION    Social History   Tobacco Use   Smoking status: Never   Smokeless tobacco: Never  Substance Use Topics   Alcohol use: No    Alcohol/week: 0.0 standard drinks    No family history on file.    Review of Systems  Constitutional:  Negative for chills and fever.  Respiratory:  Negative for cough and  shortness of breath.   Cardiovascular:  Negative for chest pain.  Gastrointestinal:  Negative for nausea and vomiting.  Musculoskeletal:  Positive for arthralgias.     Objective:  Physical Exam Well nourished and well developed. General: Alert and oriented x3, cooperative and pleasant, no acute distress. Head: normocephalic, atraumatic, neck supple. Eyes: EOMI.  Musculoskeletal: Right knee exam: Her knee incision is healed without concern for infection She is noted to have inability to perform an active knee extension and findings consistent with an extensor lag with inability to maintain full extension once passively positioned in this place. Some noted opening medially with valgus load No lower extremity, edema or erythema  Calves soft and nontender. Motor function intact in LE. Strength 5/5 LE bilaterally. Neuro: Distal pulses 2+. Sensation to light touch intact in LE.  Vital signs in last 24 hours: Temp:  [99.1 F (37.3 C)] 99.1 F (37.3 C) (10/04 1331) Pulse Rate:  [82] 82 (10/04 1331) Resp:  [16] 16 (10/04 1331) BP: (184)/(129) 184/129 (10/04 1331) SpO2:  [95 %] 95 % (10/04 1331)  Labs:  Estimated body mass index is 41.61 kg/m as calculated from the following:   Height as of 02/21/21: 4' 11"  (1.499 m).   Weight as of 03/11/21: 93.4 kg.  Imaging Review Imaging: AP and lateral of her right knee were ordered and  reviewed. On the AP view there appears to be no concerns for complications related to her femoral and tibial components that have been revised. On the lateral radiograph there is some concerns regarding patella alta.    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   The patient history, physical examination, clinical judgment of the provider and imaging studies are consistent with extensor mechanism disruption of the right knee(s), previous total knee arthroplasty. Revision total knee arthroplasty is deemed medically necessary. The treatment options including medical management, injection therapy, arthroscopy and revision arthroplasty were discussed at length. The risks and benefits of revision total knee arthroplasty were presented and reviewed. The risks due to aseptic loosening, infection, stiffness, patella tracking problems, thromboembolic complications and other imponderables were discussed. The patient acknowledged the explanation, agreed to proceed with the plan and consent was signed. Patient is being admitted for inpatient treatment for surgery, pain control, PT, OT, prophylactic antibiotics, VTE prophylaxis, progressive ambulation and ADL's and discharge planning.The patient is planning to be discharged  SNF  Therapy Plans: TBD after surgery Disposition: SNF Planned DVT Prophylaxis: aspirin 38m BID DME needed: none PCP: Dr. PShelia Media clearance received Rheumatologist: Dr. SDossie DerTXA: IV Allergies: Tramadol - , oxycodone - nausea Anesthesia Concerns: none, does not want a nerve block BMI: 43.2 Last HgbA1c: Not diabetic  Other: - Takes oxycodone 10 mg q6h - Increased oxy vs dilaudid with zofran - Bowel management - Careful positioning of hands - reports difficulty with grip after surgery   Vincent - [phone] 4[place] PA-C Orthopedic Surgery [facility] [place] [phone]

## 2021-04-10 ENCOUNTER — Other Ambulatory Visit (HOSPITAL_COMMUNITY): Payer: Medicare Other

## 2021-04-10 ENCOUNTER — Other Ambulatory Visit: Payer: Self-pay

## 2021-04-10 ENCOUNTER — Encounter (HOSPITAL_COMMUNITY): Payer: Self-pay

## 2021-04-10 ENCOUNTER — Other Ambulatory Visit: Payer: Self-pay | Admitting: Orthopedic Surgery

## 2021-04-10 DIAGNOSIS — M25561 Pain in right knee: Secondary | ICD-10-CM | POA: Diagnosis not present

## 2021-04-10 LAB — IRON AND TIBC
Iron: 76 ug/dL (ref 41–142)
Saturation Ratios: 36 % (ref 21–57)
TIBC: 209 ug/dL — ABNORMAL LOW (ref 236–444)
UIBC: 133 ug/dL (ref 120–384)

## 2021-04-10 LAB — FERRITIN: Ferritin: 1447 ng/mL — ABNORMAL HIGH (ref 11–307)

## 2021-04-11 ENCOUNTER — Inpatient Hospital Stay: Payer: Medicare Other

## 2021-04-11 DIAGNOSIS — Y792 Prosthetic and other implants, materials and accessory orthopedic devices associated with adverse incidents: Secondary | ICD-10-CM | POA: Diagnosis present

## 2021-04-11 DIAGNOSIS — Z20822 Contact with and (suspected) exposure to covid-19: Secondary | ICD-10-CM | POA: Diagnosis present

## 2021-04-11 DIAGNOSIS — Z8719 Personal history of other diseases of the digestive system: Secondary | ICD-10-CM

## 2021-04-11 DIAGNOSIS — Z7952 Long term (current) use of systemic steroids: Secondary | ICD-10-CM

## 2021-04-11 DIAGNOSIS — I10 Essential (primary) hypertension: Secondary | ICD-10-CM | POA: Diagnosis present

## 2021-04-11 DIAGNOSIS — Z8572 Personal history of non-Hodgkin lymphomas: Secondary | ICD-10-CM

## 2021-04-11 DIAGNOSIS — N1831 Chronic kidney disease, stage 3a: Secondary | ICD-10-CM

## 2021-04-11 DIAGNOSIS — R269 Unspecified abnormalities of gait and mobility: Secondary | ICD-10-CM | POA: Diagnosis present

## 2021-04-11 DIAGNOSIS — Z6841 Body Mass Index (BMI) 40.0 and over, adult: Secondary | ICD-10-CM

## 2021-04-11 DIAGNOSIS — M069 Rheumatoid arthritis, unspecified: Secondary | ICD-10-CM | POA: Diagnosis present

## 2021-04-11 DIAGNOSIS — Z79899 Other long term (current) drug therapy: Secondary | ICD-10-CM

## 2021-04-11 DIAGNOSIS — Z885 Allergy status to narcotic agent status: Secondary | ICD-10-CM

## 2021-04-11 DIAGNOSIS — M961 Postlaminectomy syndrome, not elsewhere classified: Secondary | ICD-10-CM | POA: Diagnosis present

## 2021-04-11 DIAGNOSIS — T84092A Other mechanical complication of internal right knee prosthesis, initial encounter: Principal | ICD-10-CM | POA: Diagnosis present

## 2021-04-11 DIAGNOSIS — Z96652 Presence of left artificial knee joint: Secondary | ICD-10-CM | POA: Diagnosis present

## 2021-04-11 LAB — SARS CORONAVIRUS 2 (TAT 6-24 HRS): SARS Coronavirus 2: NEGATIVE

## 2021-04-11 MED ORDER — SODIUM CHLORIDE 0.9% IV SOLUTION
250.0000 mL | Freq: Once | INTRAVENOUS | Status: AC
Start: 2021-04-11 — End: 2021-04-11
  Administered 2021-04-11: 250 mL via INTRAVENOUS

## 2021-04-11 NOTE — Patient Instructions (Signed)
Blood Transfusion, Adult A blood transfusion is a procedure in which you receive blood or a type of blood cell (blood component) through an IV. You may need a blood transfusion when your blood level is low. This may result from a bleeding disorder, illness, injury, or surgery. The blood may come from a donor. You may also be able to donate blood for yourself (autologous blood donation) before a planned surgery. The blood given in a transfusion is made up of different blood components. You may receive: Red blood cells. These carry oxygen to the cells in the body. Platelets. These help your blood to clot. Plasma. This is the liquid part of your blood. It carries proteins and other substances throughout the body. White blood cells. These help you fight infections. If you have hemophilia or another clotting disorder, you may also receive other types of blood products. Tell a health care provider about: Any blood disorders you have. Any previous reactions you have had during a blood transfusion. Any allergies you have. All medicines you are taking, including vitamins, herbs, eye drops, creams, and over-the-counter medicines. Any surgeries you have had. Any medical conditions you have, including any recent fever or cold symptoms. Whether you are pregnant or may be pregnant. What are the risks? Generally, this is a safe procedure. However, problems may occur. The most common problems include: A mild allergic reaction, such as red, swollen areas of skin (hives) and itching. Fever or chills. This may be the body's response to new blood cells received. This may occur during or up to 4 hours after the transfusion. More serious problems may include: Transfusion-associated circulatory overload (TACO), or too much fluid in the lungs. This may cause breathing problems. A serious allergic reaction, such as difficulty breathing or swelling around the face and lips. Transfusion-related acute lung injury  (TRALI), which causes breathing difficulty and low oxygen in the blood. This can occur within hours of the transfusion or several days later. Iron overload. This can happen after receiving many blood transfusions over a period of time. Infection or virus being transmitted. This is rare because donated blood is carefully tested before it is given. Hemolytic transfusion reaction. This is rare. It happens when your body's defense system (immune system)tries to attack the new blood cells. Symptoms may include fever, chills, nausea, low blood pressure, and low back or chest pain. Transfusion-associated graft-versus-host disease (TAGVHD). This is rare. It happens when donated cells attack your body's healthy tissues. What happens before the procedure? Medicines Ask your health care provider about: Changing or stopping your regular medicines. This is especially important if you are taking diabetes medicines or blood thinners. Taking medicines such as aspirin and ibuprofen. These medicines can thin your blood. Do not take these medicines unless your health care provider tells you to take them. Taking over-the-counter medicines, vitamins, herbs, and supplements. General instructions Follow instructions from your health care provider about eating and drinking restrictions. You will have a blood test to determine your blood type. This is necessary to know what kind of blood your body will accept and to match it to the donor blood. If you are going to have a planned surgery, you may be able to do an autologous blood donation. This may be done in case you need to have a transfusion. You will have your temperature, blood pressure, and pulse monitored before the transfusion. If you have had an allergic reaction to a transfusion in the past, you may be given medicine to help prevent  a reaction. This medicine may be given to you by mouth (orally) or through an IV. Set aside time for the blood transfusion. This  procedure generally takes 1-4 hours to complete. What happens during the procedure?  An IV will be inserted into one of your veins. The bag of donated blood will be attached to your IV. The blood will then enter through your vein. Your temperature, blood pressure, and pulse will be monitored regularly during the transfusion. This monitoring is done to detect early signs of a transfusion reaction. Tell your nurse right away if you have any of these symptoms during the transfusion: Shortness of breath or trouble breathing. Chest or back pain. Fever or chills. Hives or itching. If you have any signs or symptoms of a reaction, your transfusion will be stopped and you may be given medicine. When the transfusion is complete, your IV will be removed. Pressure may be applied to the IV site for a few minutes. A bandage (dressing)will be applied. The procedure may vary among health care providers and hospitals. What happens after the procedure? Your temperature, blood pressure, pulse, breathing rate, and blood oxygen level will be monitored until you leave the hospital or clinic. Your blood may be tested to see how you are responding to the transfusion. You may be warmed with fluids or blankets to maintain a normal body temperature. If you receive your blood transfusion in an outpatient setting, you will be told whom to contact to report any reactions. Where to find more information For more information on blood transfusions, visit the American Red [employer]: redcross.org Summary A blood transfusion is a procedure in which you receive blood or a type of blood cell (blood component) through an IV. The blood you receive may come from a donor or be donated by yourself (autologous blood donation) before a planned surgery. The blood given in a transfusion is made up of different blood components. You may receive red blood cells, platelets, plasma, or white blood cells depending on the condition treated. Your  temperature, blood pressure, and pulse will be monitored before, during, and after the transfusion. After the transfusion, your blood may be tested to see how your body has responded. This information is not intended to replace advice given to you by your health care provider. Make sure you discuss any questions you have with your health care provider. Document Revised: 04/27/2019 Document Reviewed: 12/15/2018 Elsevier Patient Education  [place].

## 2021-04-14 ENCOUNTER — Other Ambulatory Visit: Payer: Self-pay

## 2021-04-14 ENCOUNTER — Encounter (HOSPITAL_COMMUNITY): Payer: Self-pay | Admitting: Orthopedic Surgery

## 2021-04-14 ENCOUNTER — Ambulatory Visit (HOSPITAL_COMMUNITY): Payer: Medicare Other | Admitting: Anesthesiology

## 2021-04-14 ENCOUNTER — Encounter (HOSPITAL_COMMUNITY)
Admission: RE | Disposition: A | Discharge status: Skilled Nursing Facility | Payer: Self-pay | Source: Home / Self Care | Attending: Orthopedic Surgery

## 2021-04-14 ENCOUNTER — Inpatient Hospital Stay (HOSPITAL_COMMUNITY)
Admission: RE | Admit: 2021-04-14 | Discharge: 2021-04-18 | DRG: 488 | Disposition: A | Discharge status: Skilled Nursing Facility | Payer: Medicare Other | Attending: Orthopedic Surgery | Admitting: Orthopedic Surgery

## 2021-04-14 DIAGNOSIS — R531 Weakness: Secondary | ICD-10-CM | POA: Diagnosis not present

## 2021-04-14 DIAGNOSIS — Z96652 Presence of left artificial knee joint: Secondary | ICD-10-CM | POA: Diagnosis present

## 2021-04-14 DIAGNOSIS — Y792 Prosthetic and other implants, materials and accessory orthopedic devices associated with adverse incidents: Secondary | ICD-10-CM | POA: Diagnosis present

## 2021-04-14 DIAGNOSIS — M961 Postlaminectomy syndrome, not elsewhere classified: Secondary | ICD-10-CM | POA: Diagnosis present

## 2021-04-14 DIAGNOSIS — T84032A Mechanical loosening of internal right knee prosthetic joint, initial encounter: Secondary | ICD-10-CM | POA: Diagnosis not present

## 2021-04-14 DIAGNOSIS — I10 Essential (primary) hypertension: Secondary | ICD-10-CM | POA: Diagnosis present

## 2021-04-14 DIAGNOSIS — G8918 Other acute postprocedural pain: Secondary | ICD-10-CM | POA: Diagnosis not present

## 2021-04-14 DIAGNOSIS — M66251 Spontaneous rupture of extensor tendons, right thigh: Secondary | ICD-10-CM | POA: Diagnosis not present

## 2021-04-14 DIAGNOSIS — R269 Unspecified abnormalities of gait and mobility: Secondary | ICD-10-CM | POA: Diagnosis present

## 2021-04-14 DIAGNOSIS — D631 Anemia in chronic kidney disease: Secondary | ICD-10-CM | POA: Diagnosis not present

## 2021-04-14 DIAGNOSIS — T84022D Instability of internal right knee prosthesis, subsequent encounter: Secondary | ICD-10-CM | POA: Diagnosis not present

## 2021-04-14 DIAGNOSIS — Z20822 Contact with and (suspected) exposure to covid-19: Secondary | ICD-10-CM | POA: Diagnosis present

## 2021-04-14 DIAGNOSIS — Z79899 Other long term (current) drug therapy: Secondary | ICD-10-CM | POA: Diagnosis not present

## 2021-04-14 DIAGNOSIS — K219 Gastro-esophageal reflux disease without esophagitis: Secondary | ICD-10-CM | POA: Diagnosis not present

## 2021-04-14 DIAGNOSIS — R52 Pain, unspecified: Secondary | ICD-10-CM | POA: Diagnosis not present

## 2021-04-14 DIAGNOSIS — K922 Gastrointestinal hemorrhage, unspecified: Secondary | ICD-10-CM | POA: Diagnosis not present

## 2021-04-14 DIAGNOSIS — Z96651 Presence of right artificial knee joint: Secondary | ICD-10-CM

## 2021-04-14 DIAGNOSIS — Z8572 Personal history of non-Hodgkin lymphomas: Secondary | ICD-10-CM | POA: Diagnosis not present

## 2021-04-14 DIAGNOSIS — Z7952 Long term (current) use of systemic steroids: Secondary | ICD-10-CM | POA: Diagnosis not present

## 2021-04-14 DIAGNOSIS — M25561 Pain in right knee: Secondary | ICD-10-CM | POA: Diagnosis not present

## 2021-04-14 DIAGNOSIS — M069 Rheumatoid arthritis, unspecified: Secondary | ICD-10-CM | POA: Diagnosis present

## 2021-04-14 DIAGNOSIS — T84092A Other mechanical complication of internal right knee prosthesis, initial encounter: Secondary | ICD-10-CM | POA: Diagnosis present

## 2021-04-14 DIAGNOSIS — Z6841 Body Mass Index (BMI) 40.0 and over, adult: Secondary | ICD-10-CM | POA: Diagnosis not present

## 2021-04-14 DIAGNOSIS — I129 Hypertensive chronic kidney disease with stage 1 through stage 4 chronic kidney disease, or unspecified chronic kidney disease: Secondary | ICD-10-CM | POA: Diagnosis not present

## 2021-04-14 DIAGNOSIS — R739 Hyperglycemia, unspecified: Secondary | ICD-10-CM | POA: Diagnosis not present

## 2021-04-14 DIAGNOSIS — Z7401 Bed confinement status: Secondary | ICD-10-CM | POA: Diagnosis not present

## 2021-04-14 DIAGNOSIS — N179 Acute kidney failure, unspecified: Secondary | ICD-10-CM | POA: Diagnosis not present

## 2021-04-14 DIAGNOSIS — N183 Chronic kidney disease, stage 3 unspecified: Secondary | ICD-10-CM | POA: Diagnosis not present

## 2021-04-14 DIAGNOSIS — Z885 Allergy status to narcotic agent status: Secondary | ICD-10-CM | POA: Diagnosis not present

## 2021-04-14 DIAGNOSIS — K5900 Constipation, unspecified: Secondary | ICD-10-CM | POA: Diagnosis not present

## 2021-04-14 DIAGNOSIS — Z8719 Personal history of other diseases of the digestive system: Secondary | ICD-10-CM | POA: Diagnosis not present

## 2021-04-14 HISTORY — PX: KNEE REPAIR EXTENSOR MECHANISM: SHX6613

## 2021-04-14 HISTORY — PX: I & D KNEE WITH POLY EXCHANGE: SHX5024

## 2021-04-14 LAB — TYPE AND SCREEN
ABO/RH(D): O POS
ABO/RH(D): O POS
Antibody Screen: NEGATIVE
Antibody Screen: NEGATIVE
Unit division: 0

## 2021-04-14 LAB — BPAM RBC
Blood Product Expiration Date: 202211012359
ISSUE DATE / TIME: 202210070753
Unit Type and Rh: 5100

## 2021-04-14 SURGERY — REPAIR, EXTENSOR MECHANISM, KNEE
Anesthesia: Regional | Site: Knee | Laterality: Right

## 2021-04-14 MED ORDER — HYDROMORPHONE HCL 1 MG/ML IJ SOLN
0.5000 mg | INTRAMUSCULAR | Status: DC | PRN
  Administered 2021-04-15: 1 mg via INTRAVENOUS
  Filled 2021-04-14: qty 1

## 2021-04-14 MED ORDER — MIDAZOLAM HCL 2 MG/2ML IJ SOLN
1.0000 mg | INTRAMUSCULAR | Status: DC
Start: 2021-04-14 — End: 2021-04-14
  Filled 2021-04-14: qty 2

## 2021-04-14 MED ORDER — METOCLOPRAMIDE HCL 5 MG/ML IJ SOLN: 5.0000 mg | Freq: Three times a day (TID) | INTRAMUSCULAR | Status: DC | PRN

## 2021-04-14 MED ORDER — FOLIC ACID 1 MG PO TABS
2.0000 mg | ORAL_TABLET | Freq: Every day | ORAL | Status: DC
  Administered 2021-04-15 – 2021-04-18 (×3): 2 mg via ORAL
  Filled 2021-04-14 (×4): qty 2

## 2021-04-14 MED ORDER — BUPIVACAINE IN DEXTROSE 0.75-8.25 % IT SOLN
INTRATHECAL | Status: DC | PRN
  Administered 2021-04-14: 1.6 mL via INTRATHECAL

## 2021-04-14 MED ORDER — FERROUS SULFATE 325 (65 FE) MG PO TABS
325.0000 mg | ORAL_TABLET | Freq: Three times a day (TID) | ORAL | Status: DC
  Administered 2021-04-15 – 2021-04-18 (×9): 325 mg via ORAL
  Filled 2021-04-14 (×9): qty 1

## 2021-04-14 MED ORDER — BISACODYL 10 MG RE SUPP: 10.0000 mg | Freq: Every day | RECTAL | Status: DC | PRN

## 2021-04-14 MED ORDER — ONDANSETRON HCL 4 MG/2ML IJ SOLN: 4.0000 mg | Freq: Four times a day (QID) | INTRAMUSCULAR | Status: DC | PRN

## 2021-04-14 MED ORDER — FUROSEMIDE 40 MG PO TABS
40.0000 mg | ORAL_TABLET | Freq: Every day | ORAL | Status: DC
  Administered 2021-04-15 – 2021-04-18 (×4): 40 mg via ORAL
  Filled 2021-04-14 (×4): qty 1

## 2021-04-14 MED ORDER — FENTANYL CITRATE PF 50 MCG/ML IJ SOSY
50.0000 ug | PREFILLED_SYRINGE | INTRAMUSCULAR | Status: AC
  Administered 2021-04-14: 50 ug via INTRAVENOUS
  Filled 2021-04-14: qty 2

## 2021-04-14 MED ORDER — ESTRADIOL 1 MG PO TABS
1.0000 mg | ORAL_TABLET | Freq: Every day | ORAL | Status: DC
  Administered 2021-04-15 – 2021-04-18 (×4): 1 mg via ORAL
  Filled 2021-04-14 (×4): qty 1

## 2021-04-14 MED ORDER — LACTATED RINGERS IV SOLN: INTRAVENOUS | Status: DC

## 2021-04-14 MED ORDER — MENTHOL 3 MG MT LOZG: 1.0000 | LOZENGE | OROMUCOSAL | Status: DC | PRN

## 2021-04-14 MED ORDER — METHOCARBAMOL 500 MG IVPB - SIMPLE MED
500.0000 mg | Freq: Four times a day (QID) | INTRAVENOUS | Status: DC | PRN
  Filled 2021-04-14: qty 50

## 2021-04-14 MED ORDER — TRANEXAMIC ACID-NACL 1000-0.7 MG/100ML-% IV SOLN
1000.0000 mg | Freq: Once | INTRAVENOUS | Status: AC
  Administered 2021-04-14: 1000 mg via INTRAVENOUS
  Filled 2021-04-14: qty 100

## 2021-04-14 MED ORDER — METOCLOPRAMIDE HCL 5 MG PO TABS: 5.0000 mg | ORAL_TABLET | Freq: Three times a day (TID) | ORAL | Status: DC | PRN

## 2021-04-14 MED ORDER — ACETAMINOPHEN 10 MG/ML IV SOLN: 1000.0000 mg | Freq: Once | INTRAVENOUS | Status: DC | PRN

## 2021-04-14 MED ORDER — SODIUM CHLORIDE 0.9 % IR SOLN
Status: DC | PRN
  Administered 2021-04-14: 1000 mL

## 2021-04-14 MED ORDER — ONDANSETRON HCL 4 MG/2ML IJ SOLN
INTRAMUSCULAR | Status: DC | PRN
  Administered 2021-04-14: 4 mg via INTRAVENOUS

## 2021-04-14 MED ORDER — POTASSIUM CHLORIDE CRYS ER 20 MEQ PO TBCR
20.0000 meq | EXTENDED_RELEASE_TABLET | Freq: Every day | ORAL | Status: DC
  Administered 2021-04-15 – 2021-04-18 (×4): 20 meq via ORAL
  Filled 2021-04-14 (×4): qty 1

## 2021-04-14 MED ORDER — PROPOFOL 500 MG/50ML IV EMUL
INTRAVENOUS | Status: DC | PRN
  Administered 2021-04-14: 75 ug/kg/min via INTRAVENOUS

## 2021-04-14 MED ORDER — PROPOFOL 1000 MG/100ML IV EMUL
INTRAVENOUS | Status: AC
  Filled 2021-04-14: qty 200

## 2021-04-14 MED ORDER — DEXAMETHASONE SODIUM PHOSPHATE 10 MG/ML IJ SOLN
INTRAMUSCULAR | Status: AC
  Filled 2021-04-14: qty 1

## 2021-04-14 MED ORDER — CEFAZOLIN SODIUM-DEXTROSE 2-4 GM/100ML-% IV SOLN
2.0000 g | Freq: Four times a day (QID) | INTRAVENOUS | Status: AC
  Administered 2021-04-14 – 2021-04-15 (×2): 2 g via INTRAVENOUS
  Filled 2021-04-14 (×2): qty 100

## 2021-04-14 MED ORDER — IRBESARTAN 150 MG PO TABS
150.0000 mg | ORAL_TABLET | Freq: Every day | ORAL | Status: DC
  Administered 2021-04-15 – 2021-04-18 (×4): 150 mg via ORAL
  Filled 2021-04-14 (×4): qty 1

## 2021-04-14 MED ORDER — ONDANSETRON HCL 4 MG/2ML IJ SOLN: 4.0000 mg | Freq: Once | INTRAMUSCULAR | Status: DC | PRN

## 2021-04-14 MED ORDER — 0.9 % SODIUM CHLORIDE (POUR BTL) OPTIME
TOPICAL | Status: DC | PRN
  Administered 2021-04-14: 1000 mL

## 2021-04-14 MED ORDER — ACETAMINOPHEN 325 MG PO TABS: 325.0000 mg | ORAL_TABLET | Freq: Four times a day (QID) | ORAL | Status: DC | PRN

## 2021-04-14 MED ORDER — ORAL CARE MOUTH RINSE
15.0000 mL | Freq: Once | OROMUCOSAL | Status: AC
Start: 2021-04-14 — End: 2021-04-14

## 2021-04-14 MED ORDER — DIPHENHYDRAMINE HCL 12.5 MG/5ML PO ELIX: 12.5000 mg | ORAL_SOLUTION | ORAL | Status: DC | PRN

## 2021-04-14 MED ORDER — FENTANYL CITRATE (PF) 100 MCG/2ML IJ SOLN
INTRAMUSCULAR | Status: AC
  Filled 2021-04-14: qty 2

## 2021-04-14 MED ORDER — BUPIVACAINE-EPINEPHRINE (PF) 0.5% -1:200000 IJ SOLN
INTRAMUSCULAR | Status: DC | PRN
  Administered 2021-04-14: 30 mL via PERINEURAL

## 2021-04-14 MED ORDER — MIDAZOLAM HCL 2 MG/2ML IJ SOLN
INTRAMUSCULAR | Status: AC
  Filled 2021-04-14: qty 2

## 2021-04-14 MED ORDER — POVIDONE-IODINE 10 % EX SWAB
2.0000 "application " | Freq: Once | CUTANEOUS | Status: AC
  Administered 2021-04-14: 2 via TOPICAL

## 2021-04-14 MED ORDER — CHLORHEXIDINE GLUCONATE 0.12 % MT SOLN
15.0000 mL | Freq: Once | OROMUCOSAL | Status: AC
  Administered 2021-04-14: 15 mL via OROMUCOSAL

## 2021-04-14 MED ORDER — OXYCODONE HCL 5 MG PO TABS
ORAL_TABLET | ORAL | Status: AC
  Filled 2021-04-14: qty 2

## 2021-04-14 MED ORDER — SODIUM CHLORIDE 0.9 % IV SOLN: INTRAVENOUS | Status: DC

## 2021-04-14 MED ORDER — GABAPENTIN 300 MG PO CAPS
600.0000 mg | ORAL_CAPSULE | Freq: Every day | ORAL | Status: DC
  Administered 2021-04-14 – 2021-04-17 (×4): 600 mg via ORAL
  Filled 2021-04-14 (×4): qty 2

## 2021-04-14 MED ORDER — ASPIRIN 81 MG PO CHEW
81.0000 mg | CHEWABLE_TABLET | Freq: Two times a day (BID) | ORAL | Status: DC
  Administered 2021-04-14 – 2021-04-18 (×8): 81 mg via ORAL
  Filled 2021-04-14 (×8): qty 1

## 2021-04-14 MED ORDER — POTASSIUM CHLORIDE ER 20 MEQ PO TBCR: 20.0000 meq | EXTENDED_RELEASE_TABLET | Freq: Every morning | ORAL | Status: DC

## 2021-04-14 MED ORDER — PROPOFOL 10 MG/ML IV BOLUS
INTRAVENOUS | Status: AC
  Filled 2021-04-14: qty 20

## 2021-04-14 MED ORDER — POLYETHYLENE GLYCOL 3350 17 G PO PACK: 17.0000 g | PACK | Freq: Every day | ORAL | Status: DC | PRN

## 2021-04-14 MED ORDER — DEXAMETHASONE SODIUM PHOSPHATE 10 MG/ML IJ SOLN
10.0000 mg | Freq: Once | INTRAMUSCULAR | Status: DC
  Filled 2021-04-14: qty 1

## 2021-04-14 MED ORDER — PHENOL 1.4 % MT LIQD: 1.0000 | OROMUCOSAL | Status: DC | PRN

## 2021-04-14 MED ORDER — PANTOPRAZOLE SODIUM 40 MG PO TBEC
40.0000 mg | DELAYED_RELEASE_TABLET | Freq: Every day | ORAL | Status: DC
  Administered 2021-04-15 – 2021-04-18 (×4): 40 mg via ORAL
  Filled 2021-04-14 (×4): qty 1

## 2021-04-14 MED ORDER — CEFAZOLIN SODIUM-DEXTROSE 2-4 GM/100ML-% IV SOLN
2.0000 g | INTRAVENOUS | Status: AC
  Administered 2021-04-14: 2 g via INTRAVENOUS
  Filled 2021-04-14: qty 100

## 2021-04-14 MED ORDER — OXYCODONE HCL 5 MG PO TABS
15.0000 mg | ORAL_TABLET | ORAL | Status: DC | PRN
  Administered 2021-04-14 – 2021-04-16 (×8): 15 mg via ORAL
  Filled 2021-04-14 (×8): qty 3

## 2021-04-14 MED ORDER — ROCURONIUM BROMIDE 10 MG/ML (PF) SYRINGE
PREFILLED_SYRINGE | INTRAVENOUS | Status: AC
  Filled 2021-04-14: qty 10

## 2021-04-14 MED ORDER — ONDANSETRON HCL 4 MG PO TABS
4.0000 mg | ORAL_TABLET | Freq: Four times a day (QID) | ORAL | Status: DC | PRN
  Administered 2021-04-14 – 2021-04-15 (×2): 4 mg via ORAL
  Filled 2021-04-14 (×2): qty 1

## 2021-04-14 MED ORDER — DEXAMETHASONE SODIUM PHOSPHATE 10 MG/ML IJ SOLN
INTRAMUSCULAR | Status: DC | PRN
  Administered 2021-04-14: 10 mg via INTRAVENOUS

## 2021-04-14 MED ORDER — DEXAMETHASONE SODIUM PHOSPHATE 10 MG/ML IJ SOLN: 8.0000 mg | Freq: Once | INTRAMUSCULAR | Status: DC

## 2021-04-14 MED ORDER — ONDANSETRON HCL 4 MG/2ML IJ SOLN
INTRAMUSCULAR | Status: AC
  Filled 2021-04-14: qty 2

## 2021-04-14 MED ORDER — VANCOMYCIN HCL IN DEXTROSE 1-5 GM/200ML-% IV SOLN
1000.0000 mg | INTRAVENOUS | Status: AC
  Administered 2021-04-14: 1000 mg via INTRAVENOUS
  Filled 2021-04-14: qty 200

## 2021-04-14 MED ORDER — TRANEXAMIC ACID-NACL 1000-0.7 MG/100ML-% IV SOLN
1000.0000 mg | INTRAVENOUS | Status: AC
  Administered 2021-04-14: 1000 mg via INTRAVENOUS
  Filled 2021-04-14: qty 100

## 2021-04-14 MED ORDER — PREDNISONE 10 MG PO TABS
10.0000 mg | ORAL_TABLET | Freq: Every day | ORAL | Status: DC
  Administered 2021-04-15 – 2021-04-18 (×4): 10 mg via ORAL
  Filled 2021-04-14 (×2): qty 2
  Filled 2021-04-14 (×4): qty 1
  Filled 2021-04-14 (×2): qty 2

## 2021-04-14 MED ORDER — FENTANYL CITRATE PF 50 MCG/ML IJ SOSY: 25.0000 ug | PREFILLED_SYRINGE | INTRAMUSCULAR | Status: DC | PRN

## 2021-04-14 MED ORDER — GABAPENTIN 300 MG PO CAPS
300.0000 mg | ORAL_CAPSULE | Freq: Two times a day (BID) | ORAL | Status: DC
  Administered 2021-04-15 – 2021-04-18 (×7): 300 mg via ORAL
  Filled 2021-04-14 (×7): qty 1

## 2021-04-14 MED ORDER — AMISULPRIDE (ANTIEMETIC) 5 MG/2ML IV SOLN: 10.0000 mg | Freq: Once | INTRAVENOUS | Status: DC | PRN

## 2021-04-14 MED ORDER — OXYCODONE HCL 5 MG PO TABS
5.0000 mg | ORAL_TABLET | ORAL | Status: DC | PRN
  Administered 2021-04-14 – 2021-04-18 (×8): 10 mg via ORAL
  Filled 2021-04-14 (×7): qty 2

## 2021-04-14 MED ORDER — DOCUSATE SODIUM 100 MG PO CAPS
100.0000 mg | ORAL_CAPSULE | Freq: Two times a day (BID) | ORAL | Status: DC
  Administered 2021-04-14 – 2021-04-18 (×8): 100 mg via ORAL
  Filled 2021-04-14 (×8): qty 1

## 2021-04-14 MED ORDER — METHOCARBAMOL 500 MG PO TABS
500.0000 mg | ORAL_TABLET | Freq: Four times a day (QID) | ORAL | Status: DC | PRN
  Administered 2021-04-14: 500 mg via ORAL
  Filled 2021-04-14: qty 1

## 2021-04-14 SURGICAL SUPPLY — 68 items
ADH SKN CLS APL DERMABOND .7 (GAUZE/BANDAGES/DRESSINGS) ×1
ANCH SUT SWLK 19.1X4.75 VT (Anchor) ×1 IMPLANT
ANCHOR PEEK 4.75X19.1 SWLK C (Anchor) ×1 IMPLANT
BAG COUNTER SPONGE SURGICOUNT (BAG) ×2 IMPLANT
BAG SPEC THK2 15X12 ZIP CLS (MISCELLANEOUS) ×1
BAG SPNG CNTER NS LX DISP (BAG) ×1
BAG ZIPLOCK 12X15 (MISCELLANEOUS) ×2 IMPLANT
BIT DRILL 2 CANN GRADUATED (BIT) ×1 IMPLANT
BIT DRILL 2.4X128 (BIT) ×2 IMPLANT
BNDG ELASTIC 6X5.8 VLCR STR LF (GAUZE/BANDAGES/DRESSINGS) ×2 IMPLANT
COVER SURGICAL LIGHT HANDLE (MISCELLANEOUS) ×2 IMPLANT
CUFF TOURN SGL QUICK 34 (TOURNIQUET CUFF) ×2
CUFF TRNQT CYL 34X4.125X (TOURNIQUET CUFF) ×1 IMPLANT
DERMABOND ADVANCED (GAUZE/BANDAGES/DRESSINGS) ×1
DERMABOND ADVANCED .7 DNX12 (GAUZE/BANDAGES/DRESSINGS) ×1 IMPLANT
DRAPE INCISE IOBAN 66X45 STRL (DRAPES) ×6 IMPLANT
DRAPE U-SHAPE 47X51 STRL (DRAPES) ×2 IMPLANT
DRESSING AQUACEL AG SP 3.5X10 (GAUZE/BANDAGES/DRESSINGS) ×2 IMPLANT
DRSG AQUACEL AG ADV 3.5X14 (GAUZE/BANDAGES/DRESSINGS) ×1 IMPLANT
DRSG AQUACEL AG SP 3.5X10 (GAUZE/BANDAGES/DRESSINGS)
DURAPREP 26ML APPLICATOR (WOUND CARE) ×4 IMPLANT
ELECT REM PT RETURN 15FT ADLT (MISCELLANEOUS) ×2 IMPLANT
EVACUATOR 1/8 PVC DRAIN (DRAIN) ×2 IMPLANT
GLOVE SRG 8 PF TXTR STRL LF DI (GLOVE) ×1 IMPLANT
GLOVE SURG POLY ORTHO LF SZ7.5 (GLOVE) ×4 IMPLANT
GLOVE SURG UNDER POLY LF SZ7.5 (GLOVE) ×2 IMPLANT
GLOVE SURG UNDER POLY LF SZ8 (GLOVE) ×2
GOWN STRL REUS W/TWL LRG LVL3 (GOWN DISPOSABLE) ×2 IMPLANT
HANDPIECE INTERPULSE COAX TIP (DISPOSABLE) ×2
IMMOBILIZER KNEE 20 (SOFTGOODS) ×2
IMMOBILIZER KNEE 20 THIGH 36 (SOFTGOODS) ×1 IMPLANT
INSERT TIB CRS ATTUNE SZ3 16 (Insert) ×1 IMPLANT
KIT BIO-TENODESIS 3X8 DISP (MISCELLANEOUS) ×2
KIT INSRT BABSR STRL DISP BTN (MISCELLANEOUS) IMPLANT
KIT TURNOVER KIT A (KITS) ×2 IMPLANT
MANIFOLD NEPTUNE II (INSTRUMENTS) ×2 IMPLANT
NDL MA TROC 1/2 (NEEDLE) IMPLANT
NDL MAYO CATGUT SZ4 TPR NDL (NEEDLE) IMPLANT
NDL TAPERED W/ NITINOL LOOP (MISCELLANEOUS) IMPLANT
NEEDLE MA TROC 1/2 (NEEDLE) IMPLANT
NEEDLE MAYO CATGUT SZ4 (NEEDLE) IMPLANT
NEEDLE TAPERED W/ NITINOL LOOP (MISCELLANEOUS) ×2 IMPLANT
NS IRRIG 1000ML POUR BTL (IV SOLUTION) ×2 IMPLANT
PACK TOTAL KNEE CUSTOM (KITS) ×2 IMPLANT
PASSER SUT SWANSON 36MM LOOP (INSTRUMENTS) ×2 IMPLANT
PROTECTOR NERVE ULNAR (MISCELLANEOUS) ×2 IMPLANT
SCREW LOW PROFILE 3.5X30 (Screw) ×1 IMPLANT
SET HNDPC FAN SPRY TIP SCT (DISPOSABLE) ×1 IMPLANT
SPONGE T-LAP 18X18 ~~LOC~~+RFID (SPONGE) ×2 IMPLANT
STAPLER VISISTAT 35W (STAPLE) IMPLANT
STRIP CLOSURE SKIN 1/2X4 (GAUZE/BANDAGES/DRESSINGS) ×2 IMPLANT
SUT ETHIBOND NAB CT1 #1 30IN (SUTURE) ×2 IMPLANT
SUT FIBERWIRE #2 38 REV NDL BL (SUTURE) ×2
SUT FIBERWIRE #2 38 T-5 BLUE (SUTURE) ×4
SUT MNCRL AB 3-0 PS2 18 (SUTURE) ×2 IMPLANT
SUT MNCRL AB 4-0 PS2 18 (SUTURE) ×2 IMPLANT
SUT STRATAFIX 0 PDS 27 VIOLET (SUTURE)
SUT STRATAFIX PDS+ 0 24IN (SUTURE) ×1 IMPLANT
SUT VIC AB 1 CT1 27 (SUTURE) ×6
SUT VIC AB 1 CT1 27XBRD ANTBC (SUTURE) ×2 IMPLANT
SUT VIC AB 1 CT1 36 (SUTURE) ×1 IMPLANT
SUT VIC AB 2-0 CT1 27 (SUTURE) ×4
SUT VIC AB 2-0 CT1 TAPERPNT 27 (SUTURE) ×2 IMPLANT
SUTURE FIBERWR #2 38 T-5 BLUE (SUTURE) ×2 IMPLANT
SUTURE FIBERWR#2 38 REV NDL BL (SUTURE) IMPLANT
SUTURE STRATFX 0 PDS 27 VIOLET (SUTURE) ×1 IMPLANT
WRAP KNEE MAXI GEL POST OP (GAUZE/BANDAGES/DRESSINGS) ×2 IMPLANT
bio-tenodesis disp kit ×1 IMPLANT

## 2021-04-14 NOTE — Progress Notes (Signed)
Orthopedic Tech Progress Note Patient Details:  Helen Hardy 1950/07/07 [id number]  Patient ID: Helen Hardy, female   DOB: 1951-04-15, 70 y.o.   MRN: [id number]  Helen Hardy 04/14/2021, 4:22 PMCalled and routed right Bledsoe brace order to [employer].

## 2021-04-14 NOTE — Anesthesia Preprocedure Evaluation (Signed)
Anesthesia Evaluation  Patient identified by MRN, date of birth, ID band Patient awake    Reviewed: Allergy & Precautions, NPO status , Patient's Chart, lab work & pertinent test results  Airway Mallampati: II  TM Distance: >3 FB Neck ROM: Full    Dental  (+) Missing   Pulmonary neg pulmonary ROS,    Pulmonary exam normal breath sounds clear to auscultation       Cardiovascular hypertension, Pt. 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  Reproductive/Obstetrics                             Anesthesia Physical Anesthesia Plan  ASA: 3  Anesthesia Plan: Regional and Spinal   Post-op Pain Management:    Induction: Intravenous  PONV Risk Score and Plan: 2 and Ondansetron, Dexamethasone, Propofol infusion and Treatment may vary due to age or medical condition  Airway Management Planned: Simple Face Mask  Additional Equipment:   Intra-op Plan:   Post-operative Plan:   Informed Consent: I have reviewed the patients History and Physical, chart, labs and discussed the procedure including the risks, benefits and alternatives for the proposed anesthesia with the patient or authorized representative who has indicated his/her understanding and acceptance.     Dental advisory given  Plan Discussed with: CRNA  Anesthesia Plan Comments:         Anesthesia Quick Evaluation

## 2021-04-14 NOTE — Anesthesia Procedure Notes (Signed)
Spinal  Patient location during procedure: OR Start time: 04/14/2021 1:50 PM End time: 04/14/2021 1:55 PM Reason for block: surgical anesthesia Staffing Performed: anesthesiologist  Anesthesiologist: Murvin Natal, MD Preanesthetic Checklist Completed: patient identified, IV checked, risks and benefits discussed, surgical consent, monitors and equipment checked, pre-op evaluation and timeout performed Spinal Block Patient position: sitting Prep: DuraPrep Patient monitoring: cardiac monitor, continuous pulse ox and blood pressure Approach: midline Location: L4-5 Injection technique: single-shot Needle Needle type: Pencan  Needle gauge: 24 G Needle length: 9 cm Assessment Sensory level: T10 Events: CSF return Additional Notes Functioning IV was confirmed and monitors were applied. Sterile prep and drape, including hand hygiene and sterile gloves were used. The patient was positioned and the spine was prepped. The skin was anesthetized with lidocaine.  Free flow of clear CSF was obtained prior to injecting local anesthetic into the CSF.  The spinal needle aspirated freely following injection.  The needle was carefully withdrawn.  The patient tolerated the procedure well.

## 2021-04-14 NOTE — Progress Notes (Signed)
Did not use versed on patient. Returned to [facility]. Verified by Georgena Spurling, RN.

## 2021-04-14 NOTE — Anesthesia Procedure Notes (Signed)
Anesthesia Regional Block: Adductor canal block   Pre-Anesthetic Checklist: , timeout performed,  Correct Patient, Correct Site, Correct Laterality,  Correct Procedure,, site marked,  Risks and benefits discussed,  Surgical consent,  Pre-op evaluation,  At surgeon's request and post-op pain management  Laterality: Right  Prep: chloraprep       Needles:  Injection technique: Single-shot  Needle Type: Echogenic Stimulator Needle     Needle Length: 10cm  Needle Gauge: 20     Additional Needles:   Procedures:,,,, ultrasound used (permanent image in chart),,    Narrative:  Start time: 04/14/2021 1:15 PM End time: 04/14/2021 1:25 PM Injection made incrementally with aspirations every 5 mL.  Performed by: Personally  Anesthesiologist: Murvin Natal, MD  Additional Notes: Functioning IV was confirmed and monitors were applied. A time-out was performed. Hand hygiene and sterile gloves were used. The thigh was placed in a frog-leg position and prepped in a sterile fashion. A 145m 20ga BBraun echogenic stimulator needle was placed using ultrasound guidance.  Negative aspiration and negative test dose prior to incremental administration of local anesthetic. The patient tolerated the procedure well.

## 2021-04-14 NOTE — Anesthesia Postprocedure Evaluation (Signed)
Anesthesia Post Note  Patient: Helen Hardy  Procedure(s) Performed: RIGHT KNEE EXTENSOR MECHANISM REPAIR (Right: Knee) IRRIGATION AND DEBRIDEMENT KNEE WITH POLY EXCHANGE (Right: Knee)     Patient location during evaluation: PACU Anesthesia Type: Spinal and Regional Level of consciousness: awake and alert and oriented Pain management: pain level controlled Vital Signs Assessment: post-procedure vital signs reviewed and stable Respiratory status: spontaneous breathing, nonlabored ventilation and respiratory function stable Cardiovascular status: blood pressure returned to baseline Postop Assessment: no apparent nausea or vomiting, spinal receding, no headache and no backache Anesthetic complications: no   No notable events documented.  Last Vitals:  Vitals:   04/14/21 1730 04/14/21 1745  BP: (!) 180/91 (!) 184/88  Pulse: 71 67  Resp: 13 12  Temp:    SpO2: 100% 100%    Last Pain:  Vitals:   04/14/21 1715  TempSrc:   PainSc: 0-No pain   Pain Goal: Patients Stated Pain Goal: 5 (04/14/21 1140)                 Marthenia Rolling

## 2021-04-14 NOTE — Interval H&P Note (Signed)
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  The various methods of treatment have been discussed with the patient and family. After consideration of risks, benefits and other options for treatment, the patient has consented to  Procedure(s): RIGHT KNEE EXTENSOR MECHANISM REPAIR (Right) as a surgical intervention.  The patient's history has been reviewed, patient examined, no change in status, stable for surgery.  I have reviewed the patient's chart and labs.  Questions were answered to the patient's satisfaction.     [name]

## 2021-04-14 NOTE — Progress Notes (Signed)
AssistedDr. Ellender with right, ultrasound guided, adductor canal block. Side rails up, monitors on throughout procedure. See vital signs in flow sheet. Tolerated Procedure well.  

## 2021-04-14 NOTE — Transfer of Care (Signed)
Immediate Anesthesia Transfer of Care Note  Patient: Helen Hardy  Procedure(s) Performed: Procedure(s): RIGHT KNEE EXTENSOR MECHANISM REPAIR (Right) IRRIGATION AND DEBRIDEMENT KNEE WITH POLY EXCHANGE (Right)  Patient Location: PACU  Anesthesia Type:spinal  Level of Consciousness: Alert, Awake, Oriented  Airway & Oxygen Therapy: Patient Spontanous Breathing  Post-op Assessment: Report given to RN  Post vital signs: Reviewed and stable  Last Vitals:  Vitals:   04/14/21 1326 04/14/21 1329  BP:  (!) 185/104  Pulse: 82 79  Resp: 18 12  Temp:    SpO2: 048% 88%    Complications: No apparent anesthesia complications

## 2021-04-14 NOTE — Brief Op Note (Signed)
04/14/2021  3:19 PM  PATIENT:  Helen Hardy  70 y.o. 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    Polyethylene revision (from 14 to 16 revision CRS RP insert(Right)  SURGEON:  Surgeon(s) and Role:    * Paralee Cancel, MD - Primary  PHYSICIAN ASSISTANT: Costella Hatcher, PA-C  ANESTHESIA:   general  EBL:  <100 cc  BLOOD ADMINISTERED:none  DRAINS: none   LOCAL MEDICATIONS USED:  NONE  SPECIMEN:  No Specimen  DISPOSITION OF SPECIMEN:  N/A  COUNTS:  YES  TOURNIQUET:   Total Tourniquet Time Documented: Thigh (Right) - 60 minutes Total: Thigh (Right) - 60 minutes   DICTATION: .Other Dictation: Dictation Number [id number]  PLAN OF CARE: Admit for overnight observation  PATIENT DISPOSITION:  PACU - hemodynamically stable.   Delay start of Pharmacological VTE agent (>24hrs) due to surgical blood loss or risk of bleeding: no

## 2021-04-14 NOTE — Op Note (Signed)
NAMETESHARA, MOREE MEDICAL RECORD NO: [id number] ACCOUNT NO: [id number] DATE OF BIRTH: 12-04-1950 FACILITY: [facility] LOCATION: [facility] PHYSICIAN: Pietro Cassis. Alvan Dame, MD  Operative Report   DATE OF PROCEDURE: 04/14/2021  PREOPERATIVE DIAGNOSIS:  Recent history of revision right total knee arthroplasty with early identification of extensor mechanism disruption.  POSTOPERATIVE DIAGNOSES:   1.  Recent history of revision right total knee arthroplasty with early identification of extensor mechanism disruption. 2.  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  SURGICAL PROCEDURE: 1.  Right knee polyethylene revision.  We revised from a 14 posterior stabilized insert to a size 16 revision. CRS rotating platform insert. 2.  Right knee extensor mechanism repair.  COMPONENTS USED:  For the extensor mechanism repair included an Arthrex PEEK a SwiveLock suture anchor 4.75 mm x 19.1 mm.  SURGEON:  Pietro Cassis. Alvan Dame, MD.  ASSISTANT:  Costella Hatcher, PA-C.  Note, the Ms. Lu Duffel was present for the entirety of the case from preoperative position, perioperative management of the operative extremity, general facilitation of the case and primary wound closure.  ANESTHESIA:  General.  ESTIMATED BLOOD LOSS:  Less than 100 mL.  TOURNIQUET TIME:  Up for 60 minutes at 250 mmHg.  DRAINS:  None.  COMPLICATIONS:  None.  INDICATIONS:  The patient is a 70 year old female with history of index total knee arthroplasties approximately 20 years ago.  She had successfully undergone a left knee revision due to polyethylene wear and aseptic loosening.  She had been wanting to  have her right knee revised based on pain and dysfunction as well as a result with her left knee.  We had performed that procedure approximately 4-6 weeks ago.  It was noted to be a challenge related to her remaining bone stock and her overall size with  significant adipose tissue involving her proximal thigh.  We  also unable to revise her patellar button due to remaining bone stock.  When she was seen back in the office, she had complained about difficulty with mobility.  On examination, it was clear  that she had an extensor lag, indicating disruption of her extensor mechanism.  It was not clear when this had occurred, Given the findings on exam.  In the postoperative period I have recommended that we tried to get her to the operating room as soon as  possible to attempt to repair the extensor mechanism.  Risks of infection discussed, the risks of failure of the fixation, risks of lack of fixation and failure of any other attempts to salvage this resulting in the need for revision surgery to fixed  bearing prosthesis and a dropped leg brace.  I discussed with her in brief, the attempts that I would have trying to repair this.  Consent was obtained for benefit of improved function.  DESCRIPTION OF PROCEDURE:  The patient was brought to the operative theater.  Once adequate anesthesia, preoperative antibiotics, Ancef as well as tranexamic acid and Decadron she was positioned supine with a right thigh tourniquet placed.  The right  lower extremity was then prepped and draped in sterile fashion.  A timeout was performed identifying the patient, planned procedure, and extremity. I utilized her old incision, excising some old scar.  Soft tissue planes were created.  Once I got into  the skin and subcutaneous layer, it was readily apparent that her polyethylene had spun out secondary to the disruption of her extensor mechanism.  There was a small 1 cm portion of her extensor mechanism  still attached to the tibial tubercle, but  remaining portion had ruptured from this.  Again, her native patella was very thin and palpable without evidence of fracture.  At this point, I removed the polyethylene insert from her knee.  We irrigated the knee with normal saline solution.  Following  debridement and exposure of the extensor  mechanism I revised the polyethylene from a 14 to a 16 mm revision rotating platform polyethylene insert.  This was placed in the tibial tray.  Once this was done, the leg was held at near extension, maybe a  little bit of flexion as I prepared for attempted repair and restoration of her extensor mechanism.  I had reestablished the medial and lateral retinaculum.  I did even incise the peritendinous type tissue overlying the extensor mechanism.  Once this was  exposed, I passed two, #2 FiberWire sutures in a Krackow weave pattern with 4 strands coming out distal.  Once this was done, I debrided the proximal tibia where the tissue would hopefully try to adhere to.  I then elected to use an Arthrex anchor to  provide some stability at this site as well as support this with screw fixation within the proximal tibia distally.  The way this was performed is we did place the suture anchor per technique, first with a 2.4 guidewire followed by reaming.  I then  placed the strands of suture through the SwiveLock suture anchor and passed this into the tibia with appropriate tension to maintain the patella as near and anatomic position as possible.  Once the patella was in its appropriate location and the anchor  tensioned we then screwed the anchor into place.  I then used the 2 strands out of the anchor, which were 2-0 FiberWire sutures and passed these through the distal area of the repair and sutured this down to provide further support.  I now had the 4  strands coming through the tendon through the suture anchor lock and exposed distally.  I elected at this point to provide a third form of fixation.  I drilled for and placed a 30 mm 3.5 bicortical screw and sutured the strands around this as a further  supporting anchor.  This was then tied down to bone.  Once this was done, we reirrigated the wound.  Now, I spent time, reapproximated the medial and lateral retinacular structures including the peritendinous tissue  over the tendon itself.  I used a  combination of #1 Vicryl and #1 Stratafix suture.  Once this was done, the remainder of the wound was closed with 2-0 Vicryl and a running Monocryl stitch.  The wound was then cleaned, dried and dressed sterilely using surgical glue and Aquacel dressing.   She then had her knee wrapped into an Ace wrap and placed in knee immobilizer and brought to the recovery room in stable condition.  We will plan for her to be admitted for observation.  We will order a Bledsoe T-ROM brace to be fit and locked in  extension.  She can be weightbearing as tolerated, but the knee needs to be on at all time with focused attention to keep the leg as straight as possible to allow for this mechanism to try to heal.   PUS D: 04/14/2021 3:33:49 pm T: 04/14/2021 7:00:00 pm  JOB: [id number]/ [id number]

## 2021-04-14 NOTE — Discharge Instructions (Signed)
INSTRUCTIONS AFTER JOINT REPLACEMENT   **KEEP YOUR KNEE IN FULL EXTENSION IN A BRACE AT ALL TIMES** DO NOT remove for bathing/showering. Keep the brace and dressings dry   Remove items at home which could result in a fall. This includes throw rugs or furniture in walking pathways ICE to the affected joint every three hours while awake for 30 minutes at a time, for at least the first 3-5 days, and then as needed for pain and swelling.  Continue to use ice for pain and swelling. You may notice swelling that will progress down to the foot and ankle.  This is normal after surgery.  Elevate your leg when you are not up walking on it.   Continue to use the breathing machine you got in the hospital (incentive spirometer) which will help keep your temperature down.  It is common for your temperature to cycle up and down following surgery, especially at night when you are not up moving around and exerting yourself.  The breathing machine keeps your lungs expanded and your temperature down.   DIET:  As you were doing prior to hospitalization, we recommend a well-balanced diet.  DRESSING / WOUND CARE / SHOWERING  Keep the surgical dressing until follow up.  The dressing is water proof, so you can shower without any extra covering.  IF THE DRESSING FALLS OFF or the wound gets wet inside, change the dressing with sterile gauze.  Please use good hand washing techniques before changing the dressing.  Do not use any lotions or creams on the incision until instructed by your surgeon.    ACTIVITY  Increase activity slowly as tolerated, but follow the weight bearing instructions below.   No driving for 6 weeks or until further direction given by your physician.  You cannot drive while taking narcotics.  No lifting or carrying greater than 10 lbs. until further directed by your surgeon. Avoid periods of inactivity such as sitting longer than an hour when not asleep. This helps prevent blood clots.  You may return  to work once you are authorized by your doctor.     WEIGHT BEARING   Weight bearing as tolerated with assist device (walker, cane, etc) as directed, use it as long as suggested by your surgeon or therapist, typically at least 4-6 weeks.    CONSTIPATION  Constipation is defined medically as fewer than three stools per week and severe constipation as less than one stool per week.  Even if you have a regular bowel pattern at home, your normal regimen is likely to be disrupted due to multiple reasons following surgery.  Combination of anesthesia, postoperative narcotics, change in appetite and fluid intake all can affect your bowels.   YOU MUST use at least one of the following options; they are listed in order of increasing strength to get the job done.  They are all available over the counter, and you may need to use some, POSSIBLY even all of these options:    Drink plenty of fluids (prune juice may be helpful) and high fiber foods Colace 100 mg by mouth twice a day  Senokot for constipation as directed and as needed Dulcolax (bisacodyl), take with full glass of water  Miralax (polyethylene glycol) once or twice a day as needed.  If you have tried all these things and are unable to have a bowel movement in the first 3-4 days after surgery call either your surgeon or your primary doctor.    If you experience loose stools  or diarrhea, hold the medications until you stool forms back up.  If your symptoms do not get better within 1 week or if they get worse, check with your doctor.  If you experience "the worst abdominal pain ever" or develop nausea or vomiting, please contact the office immediately for further recommendations for treatment.   ITCHING:  If you experience itching with your medications, try taking only a single pain pill, or even half a pain pill at a time.  You can also use Benadryl over the counter for itching or also to help with sleep.   TED HOSE STOCKINGS:  Use stockings on  both legs until for at least 2 weeks or as directed by physician office. They may be removed at night for sleeping.  MEDICATIONS:  See your medication summary on the "After Visit Summary" that nursing will review with you.  You may have some home medications which will be placed on hold until you complete the course of blood thinner medication.  It is important for you to complete the blood thinner medication as prescribed.  PRECAUTIONS:  If you experience chest pain or shortness of breath - call 911 immediately for transfer to the hospital emergency department.   If you develop a fever greater that 101 F, purulent drainage from wound, increased redness or drainage from wound, foul odor from the wound/dressing, or calf pain - CONTACT YOUR SURGEON.                                                   FOLLOW-UP APPOINTMENTS:  If you do not already have a post-op appointment, please call the office for an appointment to be seen by your surgeon.  Guidelines for how soon to be seen are listed in your "After Visit Summary", but are typically between 1-4 weeks after surgery.  OTHER INSTRUCTIONS:   Knee Replacement:  Do not place pillow under knee, focus on keeping the knee straight while resting. CPM instructions: 0-90 degrees, 2 hours in the morning, 2 hours in the afternoon, and 2 hours in the evening. Place foam block, curve side up under heel at all times except when in CPM or when walking.  DO NOT modify, tear, cut, or change the foam block in any way.  POST-OPERATIVE OPIOID TAPER INSTRUCTIONS: It is important to wean off of your opioid medication as soon as possible. If you do not need pain medication after your surgery it is ok to stop day one. Opioids include: Codeine, Hydrocodone(Norco, Vicodin), Oxycodone(Percocet, oxycontin) and hydromorphone amongst others.  Long term and even short term use of opiods can cause: Increased pain response Dependence Constipation Depression Respiratory  depression And more.  Withdrawal symptoms can include Flu like symptoms Nausea, vomiting And more Techniques to manage these symptoms Hydrate well Eat regular healthy meals Stay active Use relaxation techniques(deep breathing, meditating, yoga) Do Not substitute Alcohol to help with tapering If you have been on opioids for less than two weeks and do not have pain than it is ok to stop all together.  Plan to wean off of opioids This plan should start within one week post op of your joint replacement. Maintain the same interval or time between taking each dose and first decrease the dose.  Cut the total daily intake of opioids by one tablet each day Next start to increase the time  between doses. The last dose that should be eliminated is the evening dose.   MAKE SURE YOU:  Understand these instructions.  Get help right away if you are not doing well or get worse.    Thank you for letting us be a part of your medical care team.  It is a privilege we respect greatly.  We hope these instructions will help you stay on track for a fast and full recovery!

## 2021-04-15 ENCOUNTER — Encounter (HOSPITAL_COMMUNITY): Payer: Self-pay | Admitting: Orthopedic Surgery

## 2021-04-15 ENCOUNTER — Encounter: Payer: Self-pay | Admitting: *Deleted

## 2021-04-15 LAB — COMPREHENSIVE METABOLIC PANEL
ALT: 10 U/L (ref 0–44)
AST: 22 U/L (ref 15–41)
Albumin: 3.2 g/dL — ABNORMAL LOW (ref 3.5–5.0)
Alkaline Phosphatase: 48 U/L (ref 38–126)
Anion gap: 8 (ref 5–15)
BUN: 16 mg/dL (ref 8–23)
CO2: 21 mmol/L — ABNORMAL LOW (ref 22–32)
Calcium: 8.4 mg/dL — ABNORMAL LOW (ref 8.9–10.3)
Chloride: 111 mmol/L (ref 98–111)
Creatinine, Ser: 1.02 mg/dL — ABNORMAL HIGH (ref 0.44–1.00)
GFR, Estimated: 59 mL/min — ABNORMAL LOW (ref >60)
Glucose, Bld: 127 mg/dL — ABNORMAL HIGH (ref 70–99)
Potassium: 4.9 mmol/L (ref 3.5–5.1)
Sodium: 140 mmol/L (ref 135–145)
Total Bilirubin: 0.6 mg/dL (ref 0.3–1.2)
Total Protein: 5.6 g/dL — ABNORMAL LOW (ref 6.5–8.1)

## 2021-04-15 LAB — BASIC METABOLIC PANEL
Anion gap: 8 (ref 5–15)
BUN: 16 mg/dL (ref 8–23)
CO2: 22 mmol/L (ref 22–32)
Calcium: 8.4 mg/dL — ABNORMAL LOW (ref 8.9–10.3)
Chloride: 109 mmol/L (ref 98–111)
Creatinine, Ser: 0.97 mg/dL (ref 0.44–1.00)
GFR, Estimated: >60 mL/min (ref >60)
Glucose, Bld: 130 mg/dL — ABNORMAL HIGH (ref 70–99)
Potassium: 4.8 mmol/L (ref 3.5–5.1)
Sodium: 139 mmol/L (ref 135–145)

## 2021-04-15 LAB — CBC
HCT: 32.7 % — ABNORMAL LOW (ref 36.0–46.0)
Hemoglobin: 10.3 g/dL — ABNORMAL LOW (ref 12.0–15.0)
MCH: 35.3 pg — ABNORMAL HIGH (ref 26.0–34.0)
MCHC: 31.5 g/dL (ref 30.0–36.0)
MCV: 112 fL — ABNORMAL HIGH (ref 80.0–100.0)
Platelets: 124 10*3/uL — ABNORMAL LOW (ref 150–400)
RBC: 2.92 MIL/uL — ABNORMAL LOW (ref 3.87–5.11)
RDW: 19.4 % — ABNORMAL HIGH (ref 11.5–15.5)
WBC: 6 10*3/uL (ref 4.0–10.5)
nRBC: 0.5 % — ABNORMAL HIGH (ref 0.0–0.2)

## 2021-04-15 MED ORDER — MUPIROCIN 2 % EX OINT
TOPICAL_OINTMENT | Freq: Two times a day (BID) | CUTANEOUS | Status: DC
  Filled 2021-04-15: qty 22

## 2021-04-15 NOTE — TOC Initial Note (Signed)
Transition of Care [facility]) - Initial/Assessment Note   Patient Details  Name: Helen Hardy MRN: [id number] Date of Birth: October 26, 1950  Transition of Care [facility]) CM/SW Contact:    Sherie Don, LCSW Phone Number: 04/15/2021, 1:32 PM  Clinical Narrative: PT evaluation recommended SNF and patient is agreeable. Patient requested she not return to [facility].  FL2 done; PASRR verified. Initial referral faxed to facilities in hub. TOC awaiting bed offers.  Expected Discharge Plan: Skilled Nursing Facility Barriers to Discharge: SNF Pending bed offer  Patient Goals and CMS Choice Patient states their goals for this hospitalization and ongoing recovery are:: Go to rehab before returning home CMS Medicare.gov Compare Post Acute Care list provided to:: Patient Choice offered to / list presented to : Patient  Expected Discharge Plan and Services Expected Discharge Plan: [place] In-house Referral: Clinical Social Work Post Acute Care Choice: [place] Living arrangements for the past 2 months: [place]             DME Arranged: N/A DME Agency: NA  Prior Living Arrangements/Services Living arrangements for the past 2 months: Single Family Home Lives with:: Self Patient language and need for interpreter reviewed:: Yes Do you feel safe going back to the place where you live?: Yes      Need for Family Participation in Patient Care: No (Comment) Care giver support system in place?: Yes (comment) Current home services: DME (3N1) Criminal Activity/Legal Involvement Pertinent to Current Situation/Hospitalization: No - Comment as needed  Activities of Daily Living Home Assistive Devices/Equipment: Wheelchair, Environmental consultant (specify type), Cane (specify quad or straight), Eyeglasses, Grab bars in shower, Dentures (specify type) ADL Screening (condition at time of admission) Patient's cognitive ability adequate to safely complete daily activities?: Yes Is  the patient deaf or have difficulty hearing?: No Does the patient have difficulty seeing, even when wearing glasses/contacts?: No Does the patient have difficulty concentrating, remembering, or making decisions?: No Patient able to express need for assistance with ADLs?: Yes Does the patient have difficulty dressing or bathing?: Yes Independently performs ADLs?: Yes (appropriate for developmental age) Does the patient have difficulty walking or climbing stairs?: Yes Weakness of Legs: Right Weakness of Arms/Hands: None  Permission Sought/Granted Permission sought to share information with : Facility Art therapist granted to share information with : Yes, Verbal Permission Granted Permission granted to share info w AGENCY: SNFs  Emotional Assessment Attitude/Demeanor/Rapport: Engaged Affect (typically observed): Accepting Orientation: : Oriented to Self, Oriented to Place, Oriented to  Time, Oriented to Situation Alcohol / Substance Use: Not Applicable Psych Involvement: No (comment)  Admission diagnosis:  S/P revision of total knee, right [Z96.651] 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   GI bleed 03/25/2020   SIRS (systemic inflammatory response syndrome) (HCC) 03/25/2020   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[place]) 12/25/2019   Cecal diverticulitis 12/25/2019   Rheumatoid arthritis ([place]) 12/25/2019   GERD (gastroesophageal reflux disease) 12/25/2019   Essential hypertension 12/25/2019   AKI (acute kidney injury) ([place]) 12/25/2019   Hyperglycemia 12/25/2019   Chronic radicular lumbar pain 12/25/2019   Generalized weakness 12/25/2019   Anemia of chronic renal failure, stage 3 (moderate) ([place]) 12/30/2018   Lumbar disc herniation 03/04/2016   Iron deficiency anemia 06/21/2015   Malabsorption of iron 06/21/2015   Morbid obesity ([place]) 04/10/2015   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   S/P knee replacement 04/08/2015   PCP:   Deland Pretty, MD Pharmacy:  [facility] ([address] [place] [address] [place] [address] Phone: [phone] Fax: [phone]  Readmission Risk Interventions Readmission Risk Prevention Plan 12/13/2020  Transportation Screening Complete  Home Care Screening Complete  Medication Review (RN CM) Complete  Some recent data might be hidden

## 2021-04-15 NOTE — Evaluation (Signed)
Physical Therapy Evaluation Patient Details Name: Helen Hardy MRN: [id number] DOB: [birth year] Today's Date: 04/15/2021  History of Present Illness  Pt is a 70 year old female s/p Right knee revision and extensor mechanism repair on 04/14/21.  PMHx: R TKA revision 12/12/20, bil TKR, RA, non-hodgkins lymphoma, back surgery  Clinical Impression  Patient is s/p above surgery resulting in functional limitations due to the deficits listed below (see PT Problem List).  Patient will benefit from skilled PT to increase their independence and safety with mobility to allow discharge to the venue listed below.  Pt assisted with transfer to recliner.  Pt appears very anxious about mobilizing.  Pt has been mostly transferring to w/c at home for the past 2 months and sleeping in lift chair.  Pt agreeable to SNF upon d/c to start improving strength, mobility, and independence.      Recommendations for follow up therapy are one component of a multi-disciplinary discharge planning process, led by the attending physician.  Recommendations may be updated based on patient status, additional functional criteria and insurance authorization.  Follow Up Recommendations SNF    Equipment Recommendations  None recommended by PT    Recommendations for Other Services       Precautions / Restrictions Precautions Precautions: Fall;Knee Precaution Comments: NO knee extension Required Braces or Orthoses: Other Brace Other Brace: bledsoe brace at all times Restrictions Weight Bearing Restrictions: No Other Position/Activity Restrictions: WBAT      Mobility  Bed Mobility Overal bed mobility: Needs Assistance Bed Mobility: Supine to Sit     Supine to sit: Mod assist;HOB elevated     General bed mobility comments: assist for trunk and supporting Rt LE to ensure knee extension, pt also attempted to self assist with gait belt however requesting support for safety    Transfers Overall transfer level: Needs  assistance Equipment used: Rolling walker (2 wheeled) Transfers: Sit to/from [facility] Sit to Stand: Mod assist;From elevated surface Stand pivot transfers: Mod assist       General transfer comment: verbal cues for UE and LE positioning, pt utilizes lift chair at home so elevated bed, pt performed twice as she had increased pain with first stand, able to transfer to recliner upon standing again however required assist for stability and positioning/lifting Rt LE  Ambulation/Gait                Stairs            Wheelchair Mobility    Modified Rankin (Stroke Patients Only)       Balance                                             Pertinent Vitals/Pain Pain Assessment: 0-10 Pain Score: 8  Pain Location: rigth knee Pain Descriptors / Indicators: Sore;Grimacing;Moaning;Crying Pain Intervention(s): Repositioned;Monitored during session;Premedicated before session;Ice applied    Home Living Family/patient expects to be discharged to:: Private residence Living Arrangements: Spouse/significant other Available Help at Discharge: Family;Available 24 hours/day Type of Home: House Home Access: Stairs to enter Entrance Stairs-Rails: None Entrance Stairs-Number of Steps: 2 Home Layout: One level Home Equipment: Cane - single point;Shower seat;Bedside commode;Walker - 2 wheels;Wheelchair - manual Additional Comments: reports she has portable ramp for steps to use w/c    Prior Function Level of Independence: Independent with assistive device(s)  Comments: has been performing stand pivots with RW and using w/c for mobility for the past 2 months     Hand Dominance        Extremity/Trunk Assessment   Upper Extremity Assessment Upper Extremity Assessment: RUE deficits/detail RUE Deficits / Details: hx of right arm weakness and ROM per pt ("that's my short arm")    Lower Extremity Assessment Lower Extremity  Assessment: RLE deficits/detail RLE Deficits / Details: maintained bledsoe brace, 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       Communication   Communication: No difficulties  Cognition Arousal/Alertness: Awake/alert Behavior During Therapy: WFL for tasks assessed/performed Overall Cognitive Status: Within Functional Limits for tasks assessed                                 General Comments: a little anxious about maintaining knee extension and pain      General Comments      Exercises     Assessment/Plan    PT Assessment Patient needs continued PT services  PT Problem List Decreased strength;Decreased mobility;Decreased activity tolerance;Decreased balance;Decreased knowledge of use of DME;Pain;Decreased range of motion;Obesity       PT Treatment Interventions DME instruction;Gait training;Balance training;Therapeutic exercise;Functional mobility training;Therapeutic activities;Patient/family education;Wheelchair mobility training    PT Goals (Current goals can be found in the Care Plan section)  Acute Rehab PT Goals PT Goal Formulation: With patient Time For Goal Achievement: 04/22/21 Potential to Achieve Goals: Good    Frequency 7X/week   Barriers to discharge        Co-evaluation               AM-PAC PT "6 Clicks" Mobility  Outcome Measure Help needed turning from your back to your side while in a flat bed without using bedrails?: A Lot Help needed moving from lying on your back to sitting on the side of a flat bed without using bedrails?: A Lot Help needed moving to and from a bed to a chair (including a wheelchair)?: A Lot Help needed standing up from a chair using your arms (e.g., wheelchair or bedside chair)?: A Lot Help needed to walk in hospital room?: A Lot Help needed climbing 3-5 steps with a railing? : Total 6 Click Score: 11    End of Session Equipment Utilized During Treatment: Gait belt Activity  Tolerance: Patient limited by pain Patient left: in chair;with call bell/phone within reach;with chair alarm set Nurse Communication: Mobility status PT Visit Diagnosis: Difficulty in walking, not elsewhere classified (R26.2)    Time: 8185-9093 PT Time Calculation (min) (ACUTE ONLY): 20 min   Charges:   PT Evaluation $PT Eval Low Complexity: 1 Low        Kati PT, DPT Acute Rehabilitation Services Pager: [phone] Office: [place] 04/15/2021, 12:57 PM

## 2021-04-15 NOTE — NC FL2 (Signed)
[place] MEDICAID FL2 LEVEL OF CARE SCREENING TOOL     IDENTIFICATION  Patient Name: Helen Hardy Birthdate: Jun 19, 1951 Sex: female Admission Date (Current Location): 04/14/2021  [place] and [place] Number:  Herbalist and Address:  [facility],  [place], [place]      Provider Number: [id number]  Attending Physician Name and Address:  Paralee Cancel, MD  Relative Name and Phone Number:  Rogelio Seen (spouse) Ph: [phone]    Current Level of Care: Hospital Recommended Level of Care: [place] Prior Approval Number:    Date Approved/Denied:   PASRR Number: [id number]  Discharge Plan: SNF    Current Diagnoses: 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   GI bleed 03/25/2020   SIRS (systemic inflammatory response syndrome) ([place]) 03/25/2020   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[place]) 12/25/2019   Cecal diverticulitis 12/25/2019   Rheumatoid arthritis ([place]) 12/25/2019   GERD (gastroesophageal reflux disease) 12/25/2019   Essential hypertension 12/25/2019   AKI (acute kidney injury) ([place]) 12/25/2019   Hyperglycemia 12/25/2019   Chronic radicular lumbar pain 12/25/2019   Generalized weakness 12/25/2019   Anemia of chronic renal failure, stage 3 (moderate) ([place]) 12/30/2018   Lumbar disc herniation 03/04/2016   Iron deficiency anemia 06/21/2015   Malabsorption of iron 06/21/2015   Morbid obesity ([place]) 04/10/2015   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   S/P knee replacement 04/08/2015    Orientation RESPIRATION BLADDER Height & Weight     Self, Time, Situation, Place  O2 (2L/min) Continent Weight: 216 lb (98 kg) Height:  4' 11"  (149.9 cm)  BEHAVIORAL SYMPTOMS/MOOD NEUROLOGICAL BOWEL NUTRITION STATUS      Continent Diet (Regular diet)  AMBULATORY STATUS COMMUNICATION OF NEEDS Skin   Extensive Assist Verbally Surgical wounds                       Personal Care  Assistance Level of Assistance  Bathing, Feeding, Dressing Bathing Assistance: Limited assistance Feeding assistance: Independent Dressing Assistance: Limited assistance     Functional Limitations Info  Sight, Hearing, Speech Sight Info: Adequate Hearing Info: Adequate Speech Info: Adequate    SPECIAL CARE FACTORS FREQUENCY  PT (By licensed PT), OT (By licensed OT)     PT Frequency: 5x's/week OT Frequency: 5x's/week            Contractures Contractures Info: Not present    Additional Factors Info  Code Status, Allergies Code Status Info: Full Allergies Info: Oxycodone, Tramadol           Current Medications (04/15/2021):  This is the current hospital active medication list Current Facility-Administered Medications  Medication Dose Route Frequency Provider Last Rate Last Admin   0.9 %  sodium chloride infusion   Intravenous Continuous Irving Copas, PA-C 75 mL/hr at 04/14/21 2139 New Bag at 04/14/21 2139   acetaminophen (TYLENOL) tablet 325-650 mg  325-650 mg Oral Q6H PRN Irving Copas, PA-C       aspirin chewable tablet 81 mg  81 mg Oral BID Irving Copas, PA-C   81 mg at 04/15/21 1003   bisacodyl (DULCOLAX) suppository 10 mg  10 mg Rectal Daily PRN Irving Copas, PA-C       dexamethasone (DECADRON) injection 10 mg  10 mg Intravenous Once Irving Copas, PA-C       diphenhydrAMINE (BENADRYL) 12.5 MG/5ML elixir 12.5-25 mg  12.5-25 mg Oral Q4H PRN Lu Duffel,  Nicola Girt, PA-C       docusate sodium (COLACE) capsule 100 mg  100 mg Oral BID Irving Copas, PA-C   100 mg at 04/15/21 1004   estradiol (ESTRACE) tablet 1 mg  1 mg Oral Daily Irving Copas, PA-C   1 mg at 04/15/21 1001   ferrous sulfate tablet 325 mg  325 mg Oral TID PC Irving Copas, PA-C   325 mg at 81/27/51 7001   folic acid (FOLVITE) tablet 2 mg  2 mg Oral Daily Irving Copas, PA-C   2 mg at 04/15/21 1001   furosemide (LASIX) tablet 40 mg  40 mg Oral Daily Irving Copas, PA-C    40 mg at 04/15/21 1001   gabapentin (NEURONTIN) capsule 300 mg  300 mg Oral BID Paralee Cancel, MD   300 mg at 04/15/21 1000   gabapentin (NEURONTIN) capsule 600 mg  600 mg Oral QHS Irving Copas, PA-C   600 mg at 04/14/21 2141   HYDROmorphone (DILAUDID) injection 0.5-1 mg  0.5-1 mg Intravenous Q4H PRN Irving Copas, PA-C   1 mg at 04/15/21 7494   irbesartan (AVAPRO) tablet 150 mg  150 mg Oral Daily Irving Copas, PA-C   150 mg at 04/15/21 1003   menthol-cetylpyridinium (CEPACOL) lozenge 3 mg  1 lozenge Oral PRN Irving Copas, PA-C       Or   phenol (CHLORASEPTIC) mouth spray 1 spray  1 spray Mouth/Throat PRN Irving Copas, PA-C       methocarbamol (ROBAXIN) tablet 500 mg  500 mg Oral Q6H PRN Irving Copas, PA-C   500 mg at 04/14/21 2303   Or   methocarbamol (ROBAXIN) 500 mg in dextrose 5 % 50 mL IVPB  500 mg Intravenous Q6H PRN Irving Copas, PA-C       metoCLOPramide (REGLAN) tablet 5-10 mg  5-10 mg Oral Q8H PRN Irving Copas, PA-C       Or   metoCLOPramide (REGLAN) injection 5-10 mg  5-10 mg Intravenous Q8H PRN Irving Copas, PA-C       mupirocin ointment (BACTROBAN) 2 %   Nasal BID Paralee Cancel, MD       ondansetron [facility]) tablet 4 mg  4 mg Oral Q6H PRN Costella Hatcher R, PA-C   4 mg at 04/15/21 0549   Or   ondansetron (ZOFRAN) injection 4 mg  4 mg Intravenous Q6H PRN Irving Copas, PA-C       oxyCODONE (Oxy IR/ROXICODONE) immediate release tablet 15-20 mg  15-20 mg Oral Q4H PRN Irving Copas, PA-C   15 mg at 04/15/21 1002   oxyCODONE (Oxy IR/ROXICODONE) immediate release tablet 5-10 mg  5-10 mg Oral Q4H PRN Irving Copas, PA-C   10 mg at 04/14/21 1933   pantoprazole (PROTONIX) EC tablet 40 mg  40 mg Oral Daily Irving Copas, PA-C   40 mg at 04/15/21 0809   polyethylene glycol (MIRALAX / GLYCOLAX) packet 17 g  17 g Oral Daily PRN Costella Hatcher R, PA-C       potassium chloride SA (KLOR-CON) CR tablet 20 mEq  20 mEq Oral Daily Paralee Cancel, MD   20 mEq at 04/15/21 1003   predniSONE (DELTASONE) tablet 10 mg  10 mg Oral Q breakfast Irving Copas, PA-C   10 mg at 04/15/21 4967     Discharge Medications: Please see discharge summary for a list of discharge medications.  Relevant Imaging Results:  Relevant Lab Results:   Additional Information SSN: [id number]  Sherie Don, LCSW

## 2021-04-15 NOTE — Plan of Care (Signed)
  Problem: Education: Goal: Knowledge of the prescribed therapeutic regimen will improve Outcome: Progressing   Problem: Pain Management: Goal: Pain level will decrease with appropriate interventions Outcome: Progressing

## 2021-04-15 NOTE — Progress Notes (Signed)
Physical Therapy Treatment Patient Details Name: Helen Hardy MRN: [id number] DOB: [birth year] Today's Date: 04/15/2021   History of Present Illness Pt is a 70 year old female s/p Right knee revision and extensor mechanism repair on 04/14/21.  PMHx: R TKA revision 12/12/20, bil TKR, RA, non-hodgkins lymphoma, back surgery    PT Comments    Pt ready for returning to bed.  Pt assisted with stand pivot and requiring at least mod assist for mobility (+2 for safety, weakness, and decreasing pt anxiety).  Pt's spouse present this afternoon and feels SNF upon d/c is best d/c plan at this time.   Recommendations for follow up therapy are one component of a multi-disciplinary discharge planning process, led by the attending physician.  Recommendations may be updated based on patient status, additional functional criteria and insurance authorization.  Follow Up Recommendations  SNF     Equipment Recommendations  None recommended by PT    Recommendations for Other Services       Precautions / Restrictions Precautions Precautions: Fall;Knee Precaution Comments: NO knee extension Required Braces or Orthoses: Other Brace Other Brace: bledsoe brace at all times Restrictions Weight Bearing Restrictions: No Other Position/Activity Restrictions: WBAT     Mobility  Bed Mobility Overal bed mobility: Needs Assistance Bed Mobility: Sit to Supine     Supine to sit: Mod assist;HOB elevated Sit to supine: Mod assist   General bed mobility comments: assist for controlling trunk descent and supporting Rt LE    Transfers Overall transfer level: Needs assistance Equipment used: Rolling walker (2 wheeled) Transfers: Sit to/from [facility] Sit to Stand: Mod assist;+2 physical assistance Stand pivot transfers: Mod assist;+2 physical assistance       General transfer comment: verbal cues for UE and LE positioning, assist to rise and steady, assist for stability and  positioning/lifting Rt LE with stand pivot  Ambulation/Gait                 Stairs             Wheelchair Mobility    Modified Rankin (Stroke Patients Only)       Balance                                            Cognition Arousal/Alertness: Awake/alert Behavior During Therapy: WFL for tasks assessed/performed;Anxious Overall Cognitive Status: Within Functional Limits for tasks assessed                                 General Comments: a little anxious about maintaining knee extension and pain      Exercises      General Comments        Pertinent Vitals/Pain Pain Assessment: 0-10 Pain Score: 5  Pain Location: rigth knee Pain Descriptors / Indicators: Sore;Grimacing Pain Intervention(s): Monitored during session;Repositioned (no pain upon return to bed)    Home Living                      Prior Function            PT Goals (current goals can now be found in the care plan section) Acute Rehab PT Goals PT Goal Formulation: With patient Time For Goal Achievement: 04/22/21 Potential to Achieve Goals: Good Progress towards PT goals: Progressing toward goals  Frequency    7X/week      PT Plan Current plan remains appropriate    Co-evaluation              AM-PAC PT "6 Clicks" Mobility   Outcome Measure  Help needed turning from your back to your side while in a flat bed without using bedrails?: A Lot Help needed moving from lying on your back to sitting on the side of a flat bed without using bedrails?: A Lot Help needed moving to and from a bed to a chair (including a wheelchair)?: A Lot Help needed standing up from a chair using your arms (e.g., wheelchair or bedside chair)?: A Lot Help needed to walk in hospital room?: A Lot Help needed climbing 3-5 steps with a railing? : Total 6 Click Score: 11    End of Session Equipment Utilized During Treatment: Gait belt Activity Tolerance:  Patient limited by pain Patient left: with call bell/phone within reach;in bed;with bed alarm set;with family/visitor present Nurse Communication: Mobility status PT Visit Diagnosis: Difficulty in walking, not elsewhere classified (R26.2)     Time: 1007-1219 PT Time Calculation (min) (ACUTE ONLY): 11 min  Charges:  $Therapeutic Activity: 8-22 mins                     Jannette Spanner PT, DPT Acute Rehabilitation Services Pager: [phone] Office: [place] 04/15/2021, 4:02 PM

## 2021-04-15 NOTE — Progress Notes (Signed)
   Subjective: 1 Day Post-Op Procedure(s) (LRB): RIGHT KNEE EXTENSOR MECHANISM REPAIR (Right) IRRIGATION AND DEBRIDEMENT KNEE WITH POLY EXCHANGE (Right) Patient reports pain as moderate.   Patient seen in rounds with Dr. Alvan Dame. Patient is well, and has had no acute complaints or problems. No acute events overnight. Foley catheter removed. She reports pain in the knee.  We will start therapy today.   Objective: Vital signs in last 24 hours: Temp:  [96 F (35.6 C)-98.3 F (36.8 C)] 97.9 F (36.6 C) (10/11 0519) Pulse Rate:  [62-88] 62 (10/11 0519) Resp:  [9-28] 16 (10/11 0519) BP: (130-192)/(73-131) 158/80 (10/11 0519) SpO2:  [95 %-100 %] 98 % (10/11 0519) Weight:  [98 kg] 98 kg (10/10 1318)  Intake/Output from previous day:  Intake/Output Summary (Last 24 hours) at 04/15/2021 0708 Last data filed at 04/15/2021 0600 Gross per 24 hour  Intake 1934.75 ml  Output 910 ml  Net 1024.75 ml     Intake/Output this shift: No intake/output data recorded.  Labs: Recent Labs    04/15/21 0302  HGB 10.3*   Recent Labs    04/15/21 0302  WBC 6.0  RBC 2.92*  HCT 32.7*  PLT 124*   Recent Labs    04/15/21 0302  NA 139  K 4.8  CL 109  CO2 22  BUN 16  CREATININE 0.97  GLUCOSE 130*  CALCIUM 8.4*   No results for input(s): LABPT, INR in the last 72 hours.  Exam: General - Patient is Alert and Oriented Extremity - Neurologically intact Sensation intact distally Intact pulses distally Dorsiflexion/Plantar flexion intact Bledsoe brace on the knee. Dressing - dressing C/D/I Motor Function - intact, moving foot and toes well on exam.   Past Medical History:  Diagnosis Date   Anemia    Anemia of chronic renal failure, stage 3 (moderate) ([place]) 12/30/2018   pt denies   GERD (gastroesophageal reflux disease)    History of ischemic colitis    Hospitalized   Hypertension    Malabsorption of iron 06/21/2015   Non Hodgkin's lymphoma (HCC)    Numbness and tingling of right  leg    Pneumonia    x2   RA (rheumatoid arthritis) (HCC)     Assessment/Plan: 1 Day Post-Op Procedure(s) (LRB): RIGHT KNEE EXTENSOR MECHANISM REPAIR (Right) IRRIGATION AND DEBRIDEMENT KNEE WITH POLY EXCHANGE (Right) Principal Problem:   S/P revision of total knee, right  Estimated body mass index is 43.63 kg/m as calculated from the following:   Height as of this encounter: 4' 11"  (1.499 m).   Weight as of this encounter: 98 kg. Advance diet Up with therapy    DVT Prophylaxis - Aspirin Weight bearing as tolerated. NO FLEXION of the knee  Plan to get up with PT today. Will likely need to pursue SNF placement as she has limited help at home with her husband.   We stressed the importance of keeping her knee completely straight at all times.   Griffith Citron, PA-C Orthopedic Surgery [phone] 04/15/2021, 7:08 AM

## 2021-04-16 LAB — CBC
HCT: 32.9 % — ABNORMAL LOW (ref 36.0–46.0)
Hemoglobin: 10.5 g/dL — ABNORMAL LOW (ref 12.0–15.0)
MCH: 34.9 pg — ABNORMAL HIGH (ref 26.0–34.0)
MCHC: 31.9 g/dL (ref 30.0–36.0)
MCV: 109.3 fL — ABNORMAL HIGH (ref 80.0–100.0)
Platelets: 116 10*3/uL — ABNORMAL LOW (ref 150–400)
RBC: 3.01 MIL/uL — ABNORMAL LOW (ref 3.87–5.11)
RDW: 19.2 % — ABNORMAL HIGH (ref 11.5–15.5)
WBC: 6.4 10*3/uL (ref 4.0–10.5)
nRBC: 0.6 % — ABNORMAL HIGH (ref 0.0–0.2)

## 2021-04-16 MED ORDER — ENSURE ENLIVE PO LIQD
237.0000 mL | Freq: Two times a day (BID) | ORAL | Status: DC
  Administered 2021-04-16 – 2021-04-18 (×3): 237 mL via ORAL

## 2021-04-16 NOTE — Progress Notes (Signed)
Initial Nutrition Assessment  DOCUMENTATION CODES:  Morbid obesity  INTERVENTION:  Continue regular diet.  Add Ensure Plus High Protein po BID, each supplement provides 350 kcal and 20 grams of protein.  Encourage protein intake.  NUTRITION DIAGNOSIS:  Increased nutrient needs related to post-op healing as evidenced by estimated needs.  GOAL:  Patient will meet greater than or equal to 90% of their needs  MONITOR:  PO intake, Supplement acceptance, Labs, Weight trends, Skin, I & O's  REASON FOR ASSESSMENT:  Consult Assessment of nutrition requirement/status, Wound healing  ASSESSMENT:  70 yo female with a PMH of GI bleed, SIRS, terminal ileitis, cecal diverticulitis, RA, GERD, essential HTN, AKI, hyperglycemia, chronic lumbar pain, iron deficiency anemia, and knee pain who presents with R knee pain. 10/10 - s/p revision of R knee  RD working remotely. Attempted to call patient's room phone. Pt answered.  Pt reports having a good appetite and wanting to increase her protein per doctor's orders to help heal her knee.   Pt reports not being big on red meat due to bowel intolerance. Reports that she eats some chicken and fish, but loves beans and will take Ensure.  Per Epic, pt eating 75-100% of all meals per Epic. Documentation is limited, however.  Per Epic, pt's weight appears to be increasing from previous weight. However, pt's weight is stated. RD to order new weight to observe weight changes.  Recommend adding Ensure BID for extra protein during admission and while at [facility].  Medications: reviewed; colace BID, estradiol, ferrous sulfate, folic acid, Lasix, Protonix, Klor-Con 20 mEq, prednisone, oxycodone PO PRN (given twice today)  Labs: reviewed; Glucose 130 (H)  NUTRITION - FOCUSED PHYSICAL EXAM: Unable to perform - defer to in-person assessment  Diet Order:   Diet Order             Diet regular Room service appropriate? Yes; Fluid consistency: Thin  Diet  effective now                  EDUCATION NEEDS:  Education needs have been addressed  Skin:  Skin Assessment: Skin Integrity Issues: Skin Integrity Issues:: Incisions Incisions: R knee, closed (10/10)  Last BM:  04/14/21  Height:  Ht Readings from Last 1 Encounters:  04/14/21 4' 11"  (1.499 m)   Weight:  Wt Readings from Last 1 Encounters:  04/14/21 98 kg   BMI:  Body mass index is 43.63 kg/m.  Estimated Nutritional Needs:  Kcal:  1650-1850 Protein:  110-125 grams Fluid:  >1.65 L  Derrel Nip, RD, LDN (she/her/hers) Registered Dietitian I After-Hours/Weekend Pager # in [phone]

## 2021-04-16 NOTE — TOC Progression Note (Signed)
Transition of Care [facility]) - Progression Note    Patient Details  Name: Helen Hardy MRN: [id number] Date of Birth: 10-30-1950  [facility] CM/SW Contact  Lennart Pall, LCSW Phone Number: 04/16/2021, 1:47 PM  Clinical Narrative:    Have reviewed SNF bed offers with pt and she has accepted bed at [facility] who can admit patient tomorrow. Have alerted PA and RN of need for another COVID test prior to dc.     Expected Discharge Plan: Skilled Nursing Facility Barriers to Discharge: SNF Pending bed offer  Expected Discharge Plan and Services Expected Discharge Plan: [place] In-house Referral: Clinical Social Work   Post Acute Care Choice: [place] Living arrangements for the past 2 months: Single Family Home                 DME Arranged: N/A DME Agency: NA                   Social Determinants of Health (SDOH) Interventions    Readmission Risk Interventions Readmission Risk Prevention Plan 12/13/2020  Transportation Screening Complete  Home Care Screening Complete  Medication Review (RN CM) Complete  Some recent data might be hidden

## 2021-04-16 NOTE — Progress Notes (Signed)
Physical Therapy Treatment Patient Details Name: Indiyah Paone MRN: [id number] DOB: 29-Nov-1950 Today's Date: 04/16/2021   History of Present Illness Pt is a 70 year old female s/p Right knee revision and extensor mechanism repair on 04/14/21.  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   Recommendations for follow up therapy are one component of a multi-disciplinary discharge planning process, led by the attending physician.  Recommendations may be updated based on patient status, additional functional criteria and insurance authorization.  Follow Up Recommendations  SNF     Equipment Recommendations  None recommended by PT    Recommendations for Other Services       Precautions / Restrictions Precautions Precautions: Fall;Knee Precaution Comments: NO knee extension Required Braces or Orthoses: Other Brace Other Brace: bledsoe brace at all times Restrictions Weight Bearing Restrictions: No Other Position/Activity Restrictions: WBAT           Cognition Arousal/Alertness: Awake/alert Behavior During Therapy: WFL for tasks assessed/performed;Anxious Overall Cognitive Status: Within Functional Limits for tasks assessed                                 General Comments: AxO x 3 rememerable from prior admission      Exercises   10 reps AP, knee presses, towel squeezes, SLR (AAROM), ABb/ADd (AAROM) with brace intact   General Comments        Pertinent Vitals/Pain Pain Assessment: 0-10 Pain Location: rigth knee Pain Descriptors / Indicators: Sore;Grimacing;Operative site guarding Pain Intervention(s): Monitored during session;Premedicated before session;Repositioned;Ice applied    Home Living                      Prior Function            PT Goals (current goals can now be found in the care plan  section) Progress towards PT goals: Progressing toward goals    Frequency    7X/week      PT Plan Current plan remains appropriate    Co-evaluation              AM-PAC PT "6 Clicks" Mobility   Outcome Measure  Help needed turning from your back to your side while in a flat bed without using bedrails?: A Lot Help needed moving from lying on your back to sitting on the side of a flat bed without using bedrails?: A Lot Help needed moving to and from a bed to a chair (including a wheelchair)?: A Lot Help needed standing up from a chair using your arms (e.g., wheelchair or bedside chair)?: A Lot Help needed to walk in hospital room?: A Lot Help needed climbing 3-5 steps with a railing? : Total 6 Click Score: 11    End of Session   Activity Tolerance: Patient tolerated treatment well Patient left: in bed;with bed alarm set;with call bell/phone within reach;with family/visitor present   PT Visit Diagnosis: Difficulty in walking, not elsewhere classified (R26.2)     Time: 0802-2336 PT Time Calculation (min) (ACUTE ONLY): 14 min  Charges:  $Therapeutic Exercise: 8-22 mins                    Rica Koyanagi  PTA Acute  Rehabilitation Services Pager      [phone] Office  [phone]  

## 2021-04-16 NOTE — Progress Notes (Signed)
   Subjective: 2 Days Post-Op Procedure(s) (LRB): RIGHT KNEE EXTENSOR MECHANISM REPAIR (Right) IRRIGATION AND DEBRIDEMENT KNEE WITH POLY EXCHANGE (Right) Patient reports pain as mild.   Patient seen in rounds for Dr. Alvan Dame. Patient is well, and has had no acute complaints or problems. No acute events overnight. Voiding without difficulty. Patient got out of bed with PT. We will continue therapy today.   Objective: Vital signs in last 24 hours: Temp:  [98 F (36.7 C)-98.7 F (37.1 C)] 98.7 F (37.1 C) (10/12 0520) Pulse Rate:  [80-84] 84 (10/12 0520) Resp:  [18] 18 (10/12 0520) BP: (155)/(88-90) 155/90 (10/12 0520) SpO2:  [100 %] 100 % (10/12 0520)  Intake/Output from previous day:  Intake/Output Summary (Last 24 hours) at 04/16/2021 0818 Last data filed at 04/16/2021 0600 Gross per 24 hour  Intake 1443.67 ml  Output 2700 ml  Net -1256.33 ml     Intake/Output this shift: No intake/output data recorded.  Labs: Recent Labs    04/15/21 0302 04/16/21 0315  HGB 10.3* 10.5*   Recent Labs    04/15/21 0302 04/16/21 0315  WBC 6.0 6.4  RBC 2.92* 3.01*  HCT 32.7* 32.9*  PLT 124* 116*   Recent Labs    04/15/21 0256 04/15/21 0302  NA 140 139  K 4.9 4.8  CL 111 109  CO2 21* 22  BUN 16 16  CREATININE 1.02* 0.97  GLUCOSE 127* 130*  CALCIUM 8.4* 8.4*   No results for input(s): LABPT, INR in the last 72 hours.  Exam: General - Patient is Alert and Oriented Extremity - Neurologically intact Sensation intact distally Intact pulses distally Dorsiflexion/Plantar flexion intact Dressing - dressing C/D/I Motor Function - intact, moving foot and toes well on exam.   Past Medical History:  Diagnosis Date   Anemia    Anemia of chronic renal failure, stage 3 (moderate) ([place]) 12/30/2018   pt denies   GERD (gastroesophageal reflux disease)    History of ischemic colitis    Hospitalized   Hypertension    Malabsorption of iron 06/21/2015   Non Hodgkin's lymphoma (HCC)     Numbness and tingling of right leg    Pneumonia    x2   RA (rheumatoid arthritis) (HCC)     Assessment/Plan: 2 Days Post-Op Procedure(s) (LRB): RIGHT KNEE EXTENSOR MECHANISM REPAIR (Right) IRRIGATION AND DEBRIDEMENT KNEE WITH POLY EXCHANGE (Right) Principal Problem:   S/P revision of total knee, right  Estimated body mass index is 43.63 kg/m as calculated from the following:   Height as of this encounter: 4' 11"  (1.499 m).   Weight as of this encounter: 98 kg. Advance diet Up with therapy   DVT Prophylaxis - Aspirin Weight bearing as tolerated. NO flexion. Remain in full extension in brace at all times  Plan is to go SNF. Working on placement. Will obtain nutrition consult today for assistance with evaluation and recommendations for wound healing. Continue working with PT.   Griffith Citron, PA-C Orthopedic Surgery [phone] 04/16/2021, 8:18 AM

## 2021-04-17 LAB — SARS CORONAVIRUS 2 (TAT 6-24 HRS): SARS Coronavirus 2: NEGATIVE

## 2021-04-17 MED ORDER — ASPIRIN 81 MG PO CHEW: 81.0000 mg | CHEWABLE_TABLET | Freq: Two times a day (BID) | ORAL | 0 refills | Status: AC

## 2021-04-17 MED ORDER — ENSURE ENLIVE PO LIQD: 237.0000 mL | Freq: Two times a day (BID) | ORAL | 0 refills | Status: AC

## 2021-04-17 MED ORDER — LEFLUNOMIDE 20 MG PO TABS: 20.0000 mg | ORAL_TABLET | Freq: Every morning | ORAL | Status: AC

## 2021-04-17 MED ORDER — OXYCODONE HCL 5 MG PO TABS: 10.0000 mg | ORAL_TABLET | ORAL | 0 refills | Status: DC | PRN

## 2021-04-17 MED ORDER — METHOCARBAMOL 500 MG PO TABS: 500.0000 mg | ORAL_TABLET | Freq: Four times a day (QID) | ORAL | 0 refills | Status: AC | PRN

## 2021-04-17 NOTE — Discharge Summary (Addendum)
Physician Discharge Summary   Patient ID: Helen Hardy MRN: [id number] DOB/AGE: 12/28/1950 70 y.o.  Admit date: 04/14/2021 Discharge date: 04/18/21  Primary Diagnosis:  1.  Recent history of revision right total knee arthroplasty with early identification of extensor mechanism disruption. 2.  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  Admission Diagnoses:  Past Medical History:  Diagnosis Date   Anemia    Anemia of chronic renal failure, stage 3 (moderate) ([place]) 12/30/2018   pt denies   GERD (gastroesophageal reflux disease)    History of ischemic colitis    Hospitalized   Hypertension    Malabsorption of iron 06/21/2015   Non Hodgkin's lymphoma (HCC)    Numbness and tingling of right leg    Pneumonia    x2   RA (rheumatoid arthritis) ([place])    Discharge Diagnoses:   Principal Problem:   S/P revision of total knee, right  Estimated body mass index is 43.63 kg/m as calculated from the following:   Height as of this encounter: 4' 11"  (1.499 m).   Weight as of this encounter: 98 kg.  Procedure:  Procedure(s) (LRB): RIGHT KNEE EXTENSOR MECHANISM REPAIR (Right) IRRIGATION AND DEBRIDEMENT KNEE WITH POLY EXCHANGE (Right)   Consults:  nutrition  HPI: The patient is a 70 year old female with history of index total knee arthroplasties approximately 20 years ago.  She had successfully undergone a left knee revision due to polyethylene wear and aseptic loosening.  She had been wanting to  have her right knee revised based on pain and dysfunction as well as a result with her left knee.  We had performed that procedure approximately 4-6 weeks ago.  It was noted to be a challenge related to her remaining bone stock and her overall size with  significant adipose tissue involving her proximal thigh.  We also unable to revise her patellar button due to remaining bone stock.  When she was seen back in the office, she had complained about difficulty with  mobility.  On examination, it was clear  that she had an extensor lag, indicating disruption of her extensor mechanism.  It was not clear when this had occurred, Given the findings on exam.  In the postoperative period I have recommended that we tried to get her to the operating room as soon as  possible to attempt to repair the extensor mechanism.  Risks of infection discussed, the risks of failure of the fixation, risks of lack of fixation and failure of any other attempts to salvage this resulting in the need for revision surgery to fixed  bearing prosthesis and a dropped leg brace.  I discussed with her in brief, the attempts that I would have trying to repair this.  Consent was obtained for benefit of improved function.  Laboratory Data: Admission on 04/14/2021  Component Date Value Ref Range Status   ABO/RH(D) 04/14/2021 O POS   Final   Antibody Screen 04/14/2021 NEG   Final   Sample Expiration 04/14/2021    Final                   Value:04/17/2021,2359 Performed at [facility], [place] [address] [place], [place] [address]    WBC 04/15/2021 6.0  4.0 - 10.5 K/uL Final   RBC 04/15/2021 2.92 (A) 3.87 - 5.11 MIL/uL Final   Hemoglobin 04/15/2021 10.3 (A) 12.0 - 15.0 g/dL Final   HCT 04/15/2021 32.7 (A) 36.0 - 46.0 % Final   MCV 04/15/2021 112.0 (  A) 80.0 - 100.0 fL Final   MCH 04/15/2021 35.3 (A) 26.0 - 34.0 pg Final   MCHC 04/15/2021 31.5  30.0 - 36.0 g/dL Final   RDW 04/15/2021 19.4 (A) 11.5 - 15.5 % Final   Platelets 04/15/2021 124 (A) 150 - 400 K/uL Final   Comment: Immature Platelet Fraction may be clinically indicated, consider ordering this additional test [id number] REPEATED TO VERIFY    nRBC 04/15/2021 0.5 (A) 0.0 - 0.2 % Final   Performed at [facility], [place] [address], [place], [place] [address]   Sodium 04/15/2021 139  135 - 145 mmol/L Final   Potassium 04/15/2021 4.8  3.5 - 5.1 mmol/L Final   Chloride 04/15/2021 109  98 - 111 mmol/L  Final   CO2 04/15/2021 22  22 - 32 mmol/L Final   Glucose, Bld 04/15/2021 130 (A) 70 - 99 mg/dL Final   Glucose reference range applies only to samples taken after fasting for at least 8 hours.   BUN 04/15/2021 16  8 - 23 mg/dL Final   Creatinine, Ser 04/15/2021 0.97  0.44 - 1.00 mg/dL Final   Calcium 04/15/2021 8.4 (A) 8.9 - 10.3 mg/dL Final   GFR, Estimated 04/15/2021 >60  >60 mL/min Final   Comment: (NOTE) Calculated using the CKD-EPI Creatinine Equation (2021)    Anion gap 04/15/2021 8  5 - 15 Final   Performed at [facility], [place] [address], [place], [place] [address]   Sodium 04/15/2021 140  135 - 145 mmol/L Final   Potassium 04/15/2021 4.9  3.5 - 5.1 mmol/L Final   Chloride 04/15/2021 111  98 - 111 mmol/L Final   CO2 04/15/2021 21 (A) 22 - 32 mmol/L Final   Glucose, Bld 04/15/2021 127 (A) 70 - 99 mg/dL Final   Glucose reference range applies only to samples taken after fasting for at least 8 hours.   BUN 04/15/2021 16  8 - 23 mg/dL Final   Creatinine, Ser 04/15/2021 1.02 (A) 0.44 - 1.00 mg/dL Final   Calcium 04/15/2021 8.4 (A) 8.9 - 10.3 mg/dL Final   Total Protein 04/15/2021 5.6 (A) 6.5 - 8.1 g/dL Final   Albumin 04/15/2021 3.2 (A) 3.5 - 5.0 g/dL Final   AST 04/15/2021 22  15 - 41 U/L Final   ALT 04/15/2021 10  0 - 44 U/L Final   Alkaline Phosphatase 04/15/2021 48  38 - 126 U/L Final   Total Bilirubin 04/15/2021 0.6  0.3 - 1.2 mg/dL Final   GFR, Estimated 04/15/2021 59 (A) >60 mL/min Final   Comment: (NOTE) Calculated using the CKD-EPI Creatinine Equation (2021)    Anion gap 04/15/2021 8  5 - 15 Final   Performed at [facility], [place] [address], [place], [place] [address]   WBC 04/16/2021 6.4  4.0 - 10.5 K/uL Final   RBC 04/16/2021 3.01 (A) 3.87 - 5.11 MIL/uL Final   Hemoglobin 04/16/2021 10.5 (A) 12.0 - 15.0 g/dL Final   HCT 04/16/2021 32.9 (A) 36.0 - 46.0 % Final   MCV 04/16/2021 109.3 (A) 80.0 - 100.0 fL Final   MCH 04/16/2021  34.9 (A) 26.0 - 34.0 pg Final   MCHC 04/16/2021 31.9  30.0 - 36.0 g/dL Final   RDW 04/16/2021 19.2 (A) 11.5 - 15.5 % Final   Platelets 04/16/2021 116 (A) 150 - 400 K/uL Final   Comment: Immature Platelet Fraction may be clinically indicated, consider ordering this additional test [id number] CONSISTENT WITH PREVIOUS RESULT REPEATED TO VERIFY  nRBC 04/16/2021 0.6 (A) 0.0 - 0.2 % Final   Performed at [place] [address], [place], [place] [address]   SARS Coronavirus 2 04/16/2021 NEGATIVE  NEGATIVE Final   Comment: (NOTE) SARS-CoV-2 target nucleic acids are NOT DETECTED.  The SARS-CoV-2 RNA is generally detectable in upper and lower respiratory specimens during the acute phase of infection. Negative results do not preclude SARS-CoV-2 infection, do not rule out co-infections with other pathogens, and should not be used as the sole basis for treatment or other patient management decisions. Negative results must be combined with clinical observations, patient history, and epidemiological information. The expected result is Negative.  Fact Sheet for Patients: SugarRoll.be  Fact Sheet for Healthcare Providers: https://www.woods-mathews.com/  This test is not yet approved or cleared by the [place] FDA and  has been authorized for detection and/or diagnosis of SARS-CoV-2 by FDA under an Emergency Use Authorization (EUA). This EUA will remain  in effect (meaning this test can be used) for the duration of the COVID-19 declaration under Se                          ction 564(b)(1) of the Act, 21 U.S.C. section 360bbb-3(b)(1), unless the authorization is terminated or revoked sooner.  Performed at [facility], [place] [address], [place], [place] [address]   Orders Only on 04/10/2021  Component Date Value Ref Range Status   SARS Coronavirus 2 04/10/2021 RESULT: NEGATIVE   Final   Comment: RESULT:  NEGATIVESARS-CoV-2 INTERPRETATION:A NEGATIVE  test result means that SARS-CoV-2 RNA was not present in the specimen above the limit of detection of this test. This does not preclude a possible SARS-CoV-2 infection and should not be used as the  sole basis for patient management decisions. Negative results must be combined with clinical observations, patient history, and epidemiological information. Optimum specimen types and timing for peak viral levels during infections caused by SARS-CoV-2  have not been determined. Collection of multiple specimens or types of specimens may be necessary to detect virus. Improper specimen collection and handling, sequence variability under primers/probes, or organism present below the limit of detection may  lead to false negative results. Positive and negative predictive values of testing are highly dependent on prevalence. False negative test results are more likely when prevalence of disease is high.The expected result is NEGATIVE.Fact S                          heet for  Healthcare Providers: LocalChronicle.no Sheet for Patients: SalonLookup.es Reference Range - Negative   Orders Only on 04/09/2021  Component Date Value Ref Range Status   Order Confirmation 04/09/2021    Final                   Value:ORDER PROCESSED BY BLOOD BANK Performed at [facility], [place] [address] [place],  [address]   Appointment on 04/09/2021  Component Date Value Ref Range Status   WBC Count 04/09/2021 4.6  4.0 - 10.5 K/uL Final   RBC 04/09/2021 2.54 (A) 3.87 - 5.11 MIL/uL Final   Hemoglobin 04/09/2021 9.3 (A) 12.0 - 15.0 g/dL Final   HCT 04/09/2021 28.6 (A) 36.0 - 46.0 % Final   MCV 04/09/2021 112.6 (A) 80.0 - 100.0 fL Final   MCH 04/09/2021 36.6 (A) 26.0 - 34.0 pg Final   MCHC 04/09/2021 32.5  30.0 - 36.0 g/dL Final  RDW 04/09/2021 15.3  11.5 - 15.5 % Final   Platelet Count 04/09/2021 120  (A) 150 - 400 K/uL Final   nRBC 04/09/2021 0.0  0.0 - 0.2 % Final   Neutrophils Relative % 04/09/2021 49  % Final   Neutro Abs 04/09/2021 2.3  1.7 - 7.7 K/uL Final   Lymphocytes Relative 04/09/2021 43  % Final   Lymphs Abs 04/09/2021 2.0  0.7 - 4.0 K/uL Final   Monocytes Relative 04/09/2021 7  % Final   Monocytes Absolute 04/09/2021 0.3  0.1 - 1.0 K/uL Final   Eosinophils Relative 04/09/2021 0  % Final   Eosinophils Absolute 04/09/2021 0.0  0.0 - 0.5 K/uL Final   Basophils Relative 04/09/2021 0  % Final   Basophils Absolute 04/09/2021 0.0  0.0 - 0.1 K/uL Final   Immature Granulocytes 04/09/2021 1  % Final   Abs Immature Granulocytes 04/09/2021 0.03  0.00 - 0.07 K/uL Final   Performed at [facility] at [facility], [address] [place], [place] [address]   Sodium 04/09/2021 144  135 - 145 mmol/L Final   Potassium 04/09/2021 4.6  3.5 - 5.1 mmol/L Final   Chloride 04/09/2021 108  98 - 111 mmol/L Final   CO2 04/09/2021 28  22 - 32 mmol/L Final   Glucose, Bld 04/09/2021 113 (A) 70 - 99 mg/dL Final   Glucose reference range applies only to samples taken after fasting for at least 8 hours.   BUN 04/09/2021 18  8 - 23 mg/dL Final   Creatinine 04/09/2021 1.06 (A) 0.44 - 1.00 mg/dL Final   Calcium 04/09/2021 9.3  8.9 - 10.3 mg/dL Final   Total Protein 04/09/2021 5.9 (A) 6.5 - 8.1 g/dL Final   Albumin 04/09/2021 3.8  3.5 - 5.0 g/dL Final   AST 04/09/2021 14 (A) 15 - 41 U/L Final   ALT 04/09/2021 9  0 - 44 U/L Final   Alkaline Phosphatase 04/09/2021 50  38 - 126 U/L Final   Total Bilirubin 04/09/2021 0.4  0.3 - 1.2 mg/dL Final   GFR, Estimated 04/09/2021 56 (A) >60 mL/min Final   Comment: (NOTE) Calculated using the CKD-EPI Creatinine Equation (2021)    Anion gap 04/09/2021 8  5 - 15 Final   Performed at [facility] at [facility], [place], [place] [address]   Ferritin 04/09/2021 1,447 (A) 11 - 307 ng/mL Final    Performed at [facility], [place] [address], [place], [place] [address]   Iron 04/09/2021 76  41 - 142 ug/dL Final   TIBC 04/09/2021 209 (A) 236 - 444 ug/dL Final   Saturation Ratios 04/09/2021 36  21 - 57 % Final   UIBC 04/09/2021 133  120 - 384 ug/dL Final   Performed at [facility], [place] [address] [place], [place] [address]   Retic Ct Pct 04/09/2021 2.9  0.4 - 3.1 % Final   RBC. 04/09/2021 2.56 (A) 3.87 - 5.11 MIL/uL Final   Retic Count, Absolute 04/09/2021 75.3  19.0 - 186.0 K/uL Final   Immature Retic Fract 04/09/2021 31.3 (A) 2.3 - 15.9 % Final   Performed at [facility] at [facility], [place], [place], [place] [address]   ABO/RH(D) 04/09/2021 O POS   Final   Antibody Screen 04/09/2021 NEG   Final   Sample Expiration 04/09/2021 04/12/2021,2359   Final  Unit Number 04/09/2021 [id number]   Final   Blood Component Type 04/09/2021 RED CELLS,LR   Final   Unit division 04/09/2021 00   Final   Status of Unit 04/09/2021 [facility]   Final   Transfusion Status 04/09/2021 OK TO TRANSFUSE   Final   Crossmatch Result 04/09/2021    Final                   Value:Compatible Performed at [place] [address] [place], [place] [address]    ISSUE DATE / TIME 04/09/2021 [id number]   Final   Blood Product Unit Number 04/09/2021 [id number]   Final   PRODUCT CODE 04/09/2021 [id number]   Final   Unit Type and Rh 04/09/2021 5100   Final   Blood Product Expiration Date 04/09/2021 [id number]   Final  Appointment on 03/11/2021  Component Date Value Ref Range Status   WBC Count 03/11/2021 7.3  4.0 - 10.5 K/uL Final   RBC 03/11/2021 2.70 (A) 3.87 - 5.11 MIL/uL Final   Hemoglobin 03/11/2021 10.0 (A) 12.0 - 15.0 g/dL Final   HCT 03/11/2021 31.6 (A) 36.0 - 46.0 % Final   MCV 03/11/2021 117.0 (A) 80.0 - 100.0 fL Final   MCH 03/11/2021 37.0 (A) 26.0 - 34.0 pg Final   MCHC 03/11/2021  31.6  30.0 - 36.0 g/dL Final   RDW 03/11/2021 15.6 (A) 11.5 - 15.5 % Final   Platelet Count 03/11/2021 149 (A) 150 - 400 K/uL Final   nRBC 03/11/2021 0.0  0.0 - 0.2 % Final   Neutrophils Relative % 03/11/2021 37  % Final   Neutro Abs 03/11/2021 2.7  1.7 - 7.7 K/uL Final   Lymphocytes Relative 03/11/2021 52  % Final   Lymphs Abs 03/11/2021 3.8  0.7 - 4.0 K/uL Final   Monocytes Relative 03/11/2021 9  % Final   Monocytes Absolute 03/11/2021 0.7  0.1 - 1.0 K/uL Final   Eosinophils Relative 03/11/2021 1  % Final   Eosinophils Absolute 03/11/2021 0.1  0.0 - 0.5 K/uL Final   Basophils Relative 03/11/2021 0  % Final   Basophils Absolute 03/11/2021 0.0  0.0 - 0.1 K/uL Final   Immature Granulocytes 03/11/2021 1  % Final   Abs Immature Granulocytes 03/11/2021 0.06  0.00 - 0.07 K/uL Final   Performed at [facility] at [facility], [address], [place], [place] [address]   Sodium 03/11/2021 141  135 - 145 mmol/L Final   Potassium 03/11/2021 4.0  3.5 - 5.1 mmol/L Final   Chloride 03/11/2021 100  98 - 111 mmol/L Final   CO2 03/11/2021 29  22 - 32 mmol/L Final   Glucose, Bld 03/11/2021 108 (A) 70 - 99 mg/dL Final   Glucose reference range applies only to samples taken after fasting for at least 8 hours.   BUN 03/11/2021 35 (A) 8 - 23 mg/dL Final   Creatinine 03/11/2021 2.37 (A) 0.44 - 1.00 mg/dL Final   Calcium 03/11/2021 9.6  8.9 - 10.3 mg/dL Final   Total Protein 03/11/2021 6.6  6.5 - 8.1 g/dL Final   Albumin 03/11/2021 4.0  3.5 - 5.0 g/dL Final   AST 03/11/2021 17  15 - 41 U/L Final   ALT 03/11/2021 9  0 - 44 U/L Final   Alkaline Phosphatase 03/11/2021 64  38 - 126 U/L Final   Total Bilirubin 03/11/2021 0.5  0.3 - 1.2 mg/dL Final   GFR, Estimated 03/11/2021 21 (A) >60 mL/min  Final   Comment: (NOTE) Calculated using the CKD-EPI Creatinine Equation (2021)    Anion gap 03/11/2021 12  5 - 15 Final   Performed at [facility] at [place], [place], [place] [address]   Ferritin 03/11/2021 2,399 (A) 11 - 307 ng/mL Final   Performed at [facility], [place] [address] [place], [place] [address]   Iron 03/11/2021 86  41 - 142 ug/dL Final   TIBC 03/11/2021 223 (A) 236 - 444 ug/dL Final   Saturation Ratios 03/11/2021 39  21 - 57 % Final   UIBC 03/11/2021 136  120 - 384 ug/dL Final   Performed at [facility], [place] [address] [place], [place] [address]   Retic Ct Pct 03/11/2021 1.9  0.4 - 3.1 % Final   RBC. 03/11/2021 2.70 (A) 3.87 - 5.11 MIL/uL Final   Retic Count, Absolute 03/11/2021 51.0  19.0 - 186.0 K/uL Final   Immature Retic Fract 03/11/2021 17.1 (A) 2.3 - 15.9 % Final   Performed at [facility] at [facility], [place], [place] [address]   Folate 03/11/2021 >100.0  >5.9 ng/mL Final   Comment: RESULTS CONFIRMED BY MANUAL DILUTION Performed at [place] [address], [place], [place] [address]    Vitamin B-12 03/11/2021 1,040 (A) 180 - 914 pg/mL Final   Comment: (NOTE) This assay is not validated for testing neonatal or myeloproliferative syndrome specimens for Vitamin B12 levels. Performed at [facility], [place] [address] [place], [place] [address]      X-Rays:DG Knee Right Port  Result Date: 04/08/2021 CLINICAL DATA:  Right knee pain.  Preoperative exam EXAM: PORTABLE RIGHT KNEE - 1-2 VIEW COMPARISON:  02/21/2021 FINDINGS: Status post right total knee arthroplasty revision with long stem tibial and femoral components. Unchanged hardware alignment compared to prior, with the tibial component appearing slightly proud. Patella [place]ta alignment. No periprosthetic fracture. Probable small joint effusion. Nonspecific soft tissue calcifications anterior to the knee. Diffuse soft tissue prominence. IMPRESSION: Unchanged hardware alignment status post right total knee arthroplasty  revision. No periprosthetic fracture. Electronically Signed   By: Davina Poke D.O.   On: 04/08/2021 14:46    EKG: Orders placed or performed during the hospital encounter of 11/08/20   EKG 12-Lead   EKG 12-Lead   ED EKG   ED EKG     Hospital Course: Helen Hardy is a 70 y.o. who was admitted to [facility]. They were brought to the operating room on 04/14/2021 and underwent Procedure(s): [place].  Patient tolerated the procedure well and was later transferred to the recovery room and then to the orthopaedic floor for postoperative care. They were given PO and IV analgesics for pain control following their surgery. They were given 24 hours of postoperative antibiotics of  Anti-infectives (From admission, onward)    Start     Dose/Rate Route Frequency Ordered Stop   04/14/21 2130  ceFAZolin (ANCEF) IVPB 2g/100 mL premix        2 g 200 mL/hr over 30 Minutes Intravenous Every 6 hours 04/14/21 1927 04/15/21 0520   04/14/21 1145  ceFAZolin (ANCEF) IVPB 2g/100 mL premix        2 g 200 mL/hr over 30 Minutes Intravenous On call to O.R. 04/14/21 1130 04/14/21 1411   04/14/21 1131  vancomycin (VANCOCIN) IVPB 1000 mg/200 mL premix        1,000 mg 200 mL/hr over 60 Minutes Intravenous 60 min pre-op 04/14/21 1131 04/14/21 1339      and started on DVT prophylaxis in the form of Aspirin.   PT and OT were ordered for mobilization. Discharge planning consulted to help with postop disposition and equipment needs. Patient had a good night on the evening of surgery. They started to get up OOB with therapy on POD #1. It was identified that she would need SNF. Continued to work with therapy into POD #2 and placement was pending for SNF. Pt was seen during rounds on day three and was ready to go to SNF pending progress with therapy. Unfortunately, due to insurance issues she required an additional night stay. Pt  worked with therapy on POD #4 and was meeting their goals. She was discharged to SNF later that day in stable condition.  Diet: Regular diet Activity: WBAT Follow-up: in 2 weeks Disposition: Skilled nursing facility Discharged Condition: good   Discharge Instructions     Call MD / Call 911   Complete by: As directed    If you experience chest pain or shortness of breath, CALL 911 and be transported to the hospital emergency room.  If you develope a fever above 101 F, pus (white drainage) or increased drainage or redness at the wound, or calf pain, call your surgeon's office.   Change dressing   Complete by: As directed    Maintain surgical dressing until follow up in the clinic. If the edges start to pull up, may reinforce with tape. If the dressing is no longer working, may remove and cover with gauze and tape, but must keep the area dry and clean.  Call with any questions or concerns.   Constipation Prevention   Complete by: As directed    Drink plenty of fluids.  Prune juice may be helpful.  You may use a stool softener, such as Colace (over the counter) 100 mg twice a day.  Use MiraLax (over the counter) for constipation as needed.   Diet - low sodium heart healthy   Complete by: As directed    Increase activity slowly as tolerated   Complete by: As directed    Weight bearing as tolerated with assist device (walker, cane, etc) as directed, use it as long as suggested by your surgeon or therapist, typically at least 4-6 weeks.  KEEP BRACE IN PLACE AT ALL TIMES. MAY UNLOCK TO GENTLY [place] THE AREA WITH THE LEG STRAIGHT DO NOT BEND THE KNEE.   Post-operative opioid taper instructions:   Complete by: As directed    POST-OPERATIVE OPIOID TAPER INSTRUCTIONS: It is important to wean off of your opioid medication as soon as possible. If you do not need pain medication after your surgery it is ok to stop day one. Opioids include: Codeine, Hydrocodone(Norco, Vicodin), Oxycodone(Percocet,  oxycontin) and hydromorphone amongst others.  Long term and even short term use of opiods can cause: Increased pain response Dependence Constipation Depression Respiratory depression And more.  Withdrawal symptoms can include Flu like symptoms Nausea, vomiting And more Techniques to manage these symptoms Hydrate well Eat regular healthy meals Stay active Use relaxation techniques(deep breathing, meditating, yoga) Do Not substitute Alcohol to help with tapering If you have been on opioids for less than two weeks and do not have pain than it is ok to stop all together.  Plan to wean off of opioids This plan should  start within one week post op of your joint replacement. Maintain the same interval or time between taking each dose and first decrease the dose.  Cut the total daily intake of opioids by one tablet each day Next start to increase the time between doses. The last dose that should be eliminated is the evening dose.      TED hose   Complete by: As directed    Use stockings (TED hose) for 2 weeks on both leg(s).  You may remove them at night for sleeping.      Allergies as of 04/17/2021       Reactions   Oxycodone Nausea And Vomiting   Able tolerate with antinausea medicine, Pt is currently taking   Tramadol Other (See Comments)   UNKNOWN REACTION        Medication List     TAKE these medications    acetaminophen 500 MG tablet Commonly known as: TYLENOL Take 2 tablets (1,000 mg total) by mouth every 8 (eight) hours. What changed: Another medication with the same name was removed. Continue taking this medication, and follow the directions you see here.   aspirin 81 MG chewable tablet Chew 1 tablet (81 mg total) by mouth 2 (two) times daily for 28 days.   cholecalciferol 25 MCG (1000 UNIT) tablet Commonly known as: VITAMIN D Take 1,000 Units by mouth in the morning.   docusate sodium 100 MG capsule Commonly known as: COLACE Take 1 capsule (100 mg  total) by mouth 2 (two) times daily. What changed: when to take this   estradiol 1 MG tablet Commonly known as: ESTRACE Take 1 mg by mouth in the morning.   feeding supplement Liqd Take 237 mLs by mouth 2 (two) times daily between meals for 14 days.   folic acid 1 MG tablet Commonly known as: FOLVITE Take 2 mg by mouth in the morning.   furosemide 40 MG tablet Commonly known as: LASIX Take 40 mg by mouth in the morning.   gabapentin 300 MG capsule Commonly known as: NEURONTIN Take 300-600 mg by mouth See admin instructions. Take 1 capsule (300 mg) by mouth in the morning, take 1 capsule (300 mg) by mouth in the afternoon & take 2 capsules (600 mg) by mouth at night.   leflunomide 20 MG tablet Commonly known as: ARAVA Take 1 tablet (20 mg total) by mouth in the morning. Hold for two weeks after surgery What changed: additional instructions   methocarbamol 500 MG tablet Commonly known as: ROBAXIN Take 1 tablet (500 mg total) by mouth every 6 (six) hours as needed for muscle spasms. What changed: when to take this   multivitamin with minerals Tabs tablet Take 1 tablet by mouth in the morning.   olmesartan 20 MG tablet Commonly known as: BENICAR Take 20 mg by mouth in the morning.   ondansetron 4 MG tablet Commonly known as: ZOFRAN Take 4 mg by mouth every 8 (eight) hours as needed for nausea or vomiting.   oxyCODONE 5 MG immediate release tablet Commonly known as: Oxy IR/ROXICODONE Take 2-3 tablets (10-15 mg total) by mouth every 4 (four) hours as needed for severe pain (pain score 4-6). What changed:  medication strength how much to take reasons to take this   pantoprazole 40 MG tablet Commonly known as: PROTONIX Take 40 mg by mouth in the morning.   polyethylene glycol 17 g packet Commonly known as: MIRALAX / GLYCOLAX Take 17 g by mouth daily as needed for mild constipation.  Potassium Chloride ER 20 MEQ Tbcr Take 20 mEq by mouth in the morning.    predniSONE 10 MG tablet Commonly known as: DELTASONE Take 10 mg by mouth daily with breakfast.   vitamin B-12 1000 MCG tablet Commonly known as: CYANOCOBALAMIN Take 1,000 mcg by mouth in the morning.   vitamin C 1000 MG tablet Take 1,000 mg by mouth in the morning.               Discharge Care Instructions  (From admission, onward)           Start     Ordered   04/17/21 0000  Change dressing       Comments: Maintain surgical dressing until follow up in the clinic. If the edges start to pull up, may reinforce with tape. If the dressing is no longer working, may remove and cover with gauze and tape, but must keep the area dry and clean.  Call with any questions or concerns.   04/17/21 0731            Contact information for follow-up providers     Paralee Cancel, MD. Schedule an appointment as soon as possible for a visit in 2 week(s).   Specialty: Orthopedic Surgery Contact information: [address] [place] [address] [phone]              Contact information for after-discharge care     Destination     [facility] .   Service: Skilled Nursing Contact information: [address] [place] [phone]                     Signed: Griffith Citron, PA-C Orthopedic Surgery 04/17/2021, 7:44 AM

## 2021-04-17 NOTE — Progress Notes (Signed)
Physical Therapy Treatment Patient Details Name: Helen Hardy MRN: [id number] DOB: September 29, 1950 Today's Date: 04/17/2021   History of Present Illness Pt is a 70 year old female s/p Right knee revision and extensor mechanism repair on 04/14/21.  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   General bed mobility comments: side to side rollinmg tpo place Maxi Move Pad under pt General transfer comment: + 2 assist using Maxu Move to assist pt OOB to recliner.  Also supporting R LE to minimize stress/pain. Positioned in recliner to comfort with multiple pillows.   Pt plans to D/C to SNF  Recommendations for follow up therapy are one component of a multi-disciplinary discharge planning process, led by the attending physician.  Recommendations may be updated based on patient status, additional functional criteria and insurance authorization.  Follow Up Recommendations  SNF     Equipment Recommendations  None recommended by PT    Recommendations for Other Services       Precautions / Restrictions Precautions Precautions: Fall;Knee Precaution Comments: NO knee extension Required Braces or Orthoses: Other Brace Other Brace: bledsoe brace at all times Restrictions Weight Bearing Restrictions: No Other Position/Activity Restrictions: WBAT     Mobility  Bed Mobility Overal bed mobility: Needs Assistance Bed Mobility: Rolling Rolling: Mod assist;Max assist;+2 for physical assistance;+2 for safety/equipment         General bed mobility comments: side to side rollinmg tpo place Maxi Move Pad under pt    Transfers                 General transfer comment: + 2 assist using Maxu Move to assist pt OOB to recliner.  Also supporting R LE to minimize stress/pain.  Ambulation/Gait                  Stairs             Wheelchair Mobility    Modified Rankin (Stroke Patients Only)       Balance                                            Cognition Arousal/Alertness: Awake/alert Behavior During Therapy: WFL for tasks assessed/performed;Anxious Overall Cognitive Status: Within Functional Limits for tasks assessed                                 General Comments: AxO x 3 rememerable from prior admission very pleasant      Exercises      General Comments        Pertinent Vitals/Pain Pain Assessment: Faces Faces Pain Scale: Hurts little more Pain Location: rigth knee with activity Pain Descriptors / Indicators: Sore;Grimacing;Operative site guarding Pain Intervention(s): Monitored during session;Premedicated before session;Repositioned;Ice applied    Home Living                      Prior Function            PT Goals (current goals can now be found in the care plan section)      Frequency    Min 5X/week      PT Plan Frequency  needs to be updated    Co-evaluation              AM-PAC PT "6 Clicks" Mobility   Outcome Measure  Help needed turning from your back to your side while in a flat bed without using bedrails?: A Lot Help needed moving from lying on your back to sitting on the side of a flat bed without using bedrails?: A Lot Help needed moving to and from a bed to a chair (including a wheelchair)?: A Lot Help needed standing up from a chair using your arms (e.g., wheelchair or bedside chair)?: A Lot Help needed to walk in hospital room?: A Lot Help needed climbing 3-5 steps with a railing? : Total 6 Click Score: 11    End of Session Equipment Utilized During Treatment: Gait belt Activity Tolerance: Patient tolerated treatment well Patient left: in chair;with call bell/phone within reach Nurse Communication: Mobility status;Need for lift equipment PT Visit Diagnosis: Difficulty in  walking, not elsewhere classified (R26.2)     Time: 7703-4035 PT Time Calculation (min) (ACUTE ONLY): 25 min  Charges:  $Therapeutic Activity: 23-37 mins                     Rica Koyanagi  PTA Acute  Rehabilitation Services Pager      [phone] Office      [phone]

## 2021-04-17 NOTE — TOC Progression Note (Signed)
Transition of Care [facility]) - Progression Note    Patient Details  Name: Helen Hardy MRN: [id number] Date of Birth: 1950/11/07  [facility] CM/SW Contact  Lennart Pall, LCSW Phone Number: 04/17/2021, 9:28 AM  Clinical Narrative:     Alerted by [facility] SNF that they cannot admit patient until tomorrow as the Medicare 3-day waiver has expired.  Have alerted pt, RN and PA.    Expected Discharge Plan: Skilled Nursing Facility Barriers to Discharge: SNF Pending bed offer  Expected Discharge Plan and Services Expected Discharge Plan: [place] In-house Referral: Clinical Social Work   Post Acute Care Choice: [place] Living arrangements for the past 2 months: Single Family Home Expected Discharge Date: 04/17/21               DME Arranged: N/A DME Agency: NA                   Social Determinants of Health (SDOH) Interventions    Readmission Risk Interventions Readmission Risk Prevention Plan 12/13/2020  Transportation Screening Complete  Home Care Screening Complete  Medication Review (RN CM) Complete  Some recent data might be hidden

## 2021-04-18 DIAGNOSIS — D631 Anemia in chronic kidney disease: Secondary | ICD-10-CM | POA: Diagnosis not present

## 2021-04-18 DIAGNOSIS — Z9889 Other specified postprocedural states: Secondary | ICD-10-CM | POA: Diagnosis not present

## 2021-04-18 DIAGNOSIS — D649 Anemia, unspecified: Secondary | ICD-10-CM | POA: Diagnosis not present

## 2021-04-18 DIAGNOSIS — Z7401 Bed confinement status: Secondary | ICD-10-CM | POA: Diagnosis not present

## 2021-04-18 DIAGNOSIS — M25561 Pain in right knee: Secondary | ICD-10-CM | POA: Diagnosis not present

## 2021-04-18 DIAGNOSIS — K5903 Drug induced constipation: Secondary | ICD-10-CM | POA: Diagnosis not present

## 2021-04-18 DIAGNOSIS — I10 Essential (primary) hypertension: Secondary | ICD-10-CM | POA: Diagnosis not present

## 2021-04-18 DIAGNOSIS — R2689 Other abnormalities of gait and mobility: Secondary | ICD-10-CM | POA: Diagnosis not present

## 2021-04-18 DIAGNOSIS — Z96651 Presence of right artificial knee joint: Secondary | ICD-10-CM | POA: Diagnosis not present

## 2021-04-18 DIAGNOSIS — Z471 Aftercare following joint replacement surgery: Secondary | ICD-10-CM | POA: Diagnosis not present

## 2021-04-18 DIAGNOSIS — R300 Dysuria: Secondary | ICD-10-CM | POA: Diagnosis not present

## 2021-04-18 DIAGNOSIS — M0579 Rheumatoid arthritis with rheumatoid factor of multiple sites without organ or systems involvement: Secondary | ICD-10-CM | POA: Diagnosis not present

## 2021-04-18 DIAGNOSIS — R739 Hyperglycemia, unspecified: Secondary | ICD-10-CM | POA: Diagnosis not present

## 2021-04-18 DIAGNOSIS — Z7409 Other reduced mobility: Secondary | ICD-10-CM | POA: Diagnosis not present

## 2021-04-18 DIAGNOSIS — R52 Pain, unspecified: Secondary | ICD-10-CM | POA: Diagnosis not present

## 2021-04-18 DIAGNOSIS — K219 Gastro-esophageal reflux disease without esophagitis: Secondary | ICD-10-CM | POA: Diagnosis not present

## 2021-04-18 DIAGNOSIS — R531 Weakness: Secondary | ICD-10-CM | POA: Diagnosis not present

## 2021-04-18 DIAGNOSIS — N183 Chronic kidney disease, stage 3 unspecified: Secondary | ICD-10-CM | POA: Diagnosis not present

## 2021-04-18 DIAGNOSIS — K922 Gastrointestinal hemorrhage, unspecified: Secondary | ICD-10-CM | POA: Diagnosis not present

## 2021-04-18 DIAGNOSIS — Z7689 Persons encountering health services in other specified circumstances: Secondary | ICD-10-CM | POA: Diagnosis not present

## 2021-04-18 DIAGNOSIS — Z9181 History of falling: Secondary | ICD-10-CM | POA: Diagnosis not present

## 2021-04-18 DIAGNOSIS — T84022D Instability of internal right knee prosthesis, subsequent encounter: Secondary | ICD-10-CM | POA: Diagnosis not present

## 2021-04-18 DIAGNOSIS — K5900 Constipation, unspecified: Secondary | ICD-10-CM | POA: Diagnosis not present

## 2021-04-18 DIAGNOSIS — R262 Difficulty in walking, not elsewhere classified: Secondary | ICD-10-CM | POA: Diagnosis not present

## 2021-04-18 DIAGNOSIS — M069 Rheumatoid arthritis, unspecified: Secondary | ICD-10-CM | POA: Diagnosis not present

## 2021-04-18 NOTE — Progress Notes (Signed)
   Subjective: 4 Days Post-Op Procedure(s) (LRB): RIGHT KNEE EXTENSOR MECHANISM REPAIR (Right) IRRIGATION AND DEBRIDEMENT KNEE WITH POLY EXCHANGE (Right) Patient reports pain as mild.   Patient seen in rounds for Dr. Alvan Dame. Patient is well, and has had no acute complaints or problems. No acute events overnight. Has bed at SNF for today.  We will continue therapy today.   Objective: Vital signs in last 24 hours: Temp:  [97.8 F (36.6 C)-98.7 F (37.1 C)] 98.2 F (36.8 C) (10/14 0527) Pulse Rate:  [70-88] 75 (10/14 0527) Resp:  [14-16] 16 (10/14 0527) BP: (113-153)/(72-84) 153/77 (10/14 0527) SpO2:  [93 %-99 %] 93 % (10/14 0527)  Intake/Output from previous day:  Intake/Output Summary (Last 24 hours) at 04/18/2021 0820 Last data filed at 04/18/2021 0738 Gross per 24 hour  Intake 849.82 ml  Output 2050 ml  Net -1200.18 ml     Intake/Output this shift: Total I/O In: -  Out: 300 [Urine:300]  Labs: Recent Labs    04/16/21 0315  HGB 10.5*   Recent Labs    04/16/21 0315  WBC 6.4  RBC 3.01*  HCT 32.9*  PLT 116*   No results for input(s): NA, K, CL, CO2, BUN, CREATININE, GLUCOSE, CALCIUM in the last 72 hours. No results for input(s): LABPT, INR in the last 72 hours.  Exam: General - Patient is Alert and Oriented Extremity - Neurologically intact Sensation intact distally Intact pulses distally Dorsiflexion/Plantar flexion intact Dressing - dressing C/D/I Motor Function - intact, moving foot and toes well on exam.   Past Medical History:  Diagnosis Date   Anemia    Anemia of chronic renal failure, stage 3 (moderate) ([place]) 12/30/2018   pt denies   GERD (gastroesophageal reflux disease)    History of ischemic colitis    Hospitalized   Hypertension    Malabsorption of iron 06/21/2015   Non Hodgkin's lymphoma (HCC)    Numbness and tingling of right leg    Pneumonia    x2   RA (rheumatoid arthritis) (HCC)     Assessment/Plan: 4 Days Post-Op Procedure(s)  (LRB): RIGHT KNEE EXTENSOR MECHANISM REPAIR (Right) IRRIGATION AND DEBRIDEMENT KNEE WITH POLY EXCHANGE (Right) Principal Problem:   S/P revision of total knee, right  Estimated body mass index is 43.63 kg/m as calculated from the following:   Height as of this encounter: 4' 11"  (1.499 m).   Weight as of this encounter: 98 kg. Advance diet Up with therapy D/C IV fluids   DVT Prophylaxis - Aspirin Weight bearing as tolerated.  Plan is to go to SNF today. D/C summary complete. Rx printed. Follow up in the office for 2 week post op.   Griffith Citron, PA-C Orthopedic Surgery [phone] 04/18/2021, 8:20 AM

## 2021-04-18 NOTE — Care Management Important Message (Signed)
Important Message  Patient Details IM Letter given to the Patient. Name: Katlin Bortner MRN: [id number] Date of Birth: 1951-03-23   Medicare Important Message Given:  Yes     Kerin Salen 04/18/2021, 1:55 PM

## 2021-04-18 NOTE — TOC Transition Note (Signed)
Transition of Care [facility]) - CM/SW Discharge Note   Patient Details  Name: Helen Hardy MRN: [id number] Date of Birth: Nov 23, 1950  [facility] of [facility]) CM/SW Contact:  Lennart Pall, LCSW Phone Number: 04/18/2021, 9:57 AM   Clinical Narrative:    Pt cleared for dc today to [facility] SNF (Room 607).  [facility] called at 9:45 am for transport.  RN to call report to [phone].  No further TOC needs.   Final next level of care: Skilled Nursing Facility Barriers to Discharge: Barriers Resolved   Patient Goals and CMS Choice Patient states their goals for this hospitalization and ongoing recovery are:: Go to rehab before returning home CMS Medicare.gov Compare Post Acute Care list provided to:: Patient Choice offered to / list presented to : Patient  Discharge Placement   Existing PASRR number confirmed : 04/15/21          Patient chooses bed at: [facility] Patient to be transferred to facility by: [place] Name of family member notified: spouse Patient and family notified of of transfer: 04/18/21  Discharge Plan and Services In-house Referral: Clinical Social Work   Post Acute Care Choice: [place]          DME Arranged: N/A DME Agency: NA                  Social Determinants of Health (SDOH) Interventions     Readmission Risk Interventions Readmission Risk Prevention Plan 12/13/2020  Transportation Screening Complete  Home Care Screening Complete  Medication Review (RN CM) Complete  Some recent data might be hidden

## 2021-04-18 NOTE — Plan of Care (Signed)
Patient dc'd

## 2021-04-18 NOTE — Plan of Care (Signed)
  Problem: Education: Goal: Knowledge of the prescribed therapeutic regimen will improve Outcome: Progressing   Problem: Activity: Goal: Ability to avoid complications of mobility impairment will improve Outcome: Progressing   Problem: Pain Management: Goal: Pain level will decrease with appropriate interventions Outcome: Progressing   

## 2021-04-21 DIAGNOSIS — Z7409 Other reduced mobility: Secondary | ICD-10-CM | POA: Diagnosis not present

## 2021-04-21 DIAGNOSIS — K5900 Constipation, unspecified: Secondary | ICD-10-CM | POA: Diagnosis not present

## 2021-04-21 DIAGNOSIS — Z9181 History of falling: Secondary | ICD-10-CM | POA: Diagnosis not present

## 2021-04-21 DIAGNOSIS — M25561 Pain in right knee: Secondary | ICD-10-CM | POA: Diagnosis not present

## 2021-04-21 DIAGNOSIS — R300 Dysuria: Secondary | ICD-10-CM | POA: Diagnosis not present

## 2021-04-22 DIAGNOSIS — I10 Essential (primary) hypertension: Secondary | ICD-10-CM | POA: Diagnosis not present

## 2021-04-22 DIAGNOSIS — N183 Chronic kidney disease, stage 3 unspecified: Secondary | ICD-10-CM | POA: Diagnosis not present

## 2021-04-22 DIAGNOSIS — D649 Anemia, unspecified: Secondary | ICD-10-CM | POA: Diagnosis not present

## 2021-04-22 DIAGNOSIS — Z9889 Other specified postprocedural states: Secondary | ICD-10-CM | POA: Diagnosis not present

## 2021-04-24 DIAGNOSIS — K219 Gastro-esophageal reflux disease without esophagitis: Secondary | ICD-10-CM | POA: Diagnosis not present

## 2021-04-24 DIAGNOSIS — I10 Essential (primary) hypertension: Secondary | ICD-10-CM | POA: Diagnosis not present

## 2021-04-24 DIAGNOSIS — R262 Difficulty in walking, not elsewhere classified: Secondary | ICD-10-CM | POA: Diagnosis not present

## 2021-04-24 DIAGNOSIS — M069 Rheumatoid arthritis, unspecified: Secondary | ICD-10-CM | POA: Diagnosis not present

## 2021-04-30 ENCOUNTER — Encounter (HOSPITAL_COMMUNITY): Payer: Self-pay | Admitting: Orthopedic Surgery

## 2021-05-02 DIAGNOSIS — K5903 Drug induced constipation: Secondary | ICD-10-CM | POA: Diagnosis not present

## 2021-05-02 DIAGNOSIS — R52 Pain, unspecified: Secondary | ICD-10-CM | POA: Diagnosis not present

## 2021-05-02 DIAGNOSIS — T84022D Instability of internal right knee prosthesis, subsequent encounter: Secondary | ICD-10-CM | POA: Diagnosis not present

## 2021-05-02 DIAGNOSIS — M069 Rheumatoid arthritis, unspecified: Secondary | ICD-10-CM | POA: Diagnosis not present

## 2021-05-06 DIAGNOSIS — K5900 Constipation, unspecified: Secondary | ICD-10-CM | POA: Diagnosis not present

## 2021-05-06 DIAGNOSIS — D631 Anemia in chronic kidney disease: Secondary | ICD-10-CM | POA: Diagnosis not present

## 2021-05-06 DIAGNOSIS — Z96651 Presence of right artificial knee joint: Secondary | ICD-10-CM | POA: Diagnosis not present

## 2021-05-06 DIAGNOSIS — K219 Gastro-esophageal reflux disease without esophagitis: Secondary | ICD-10-CM | POA: Diagnosis not present

## 2021-05-06 DIAGNOSIS — I10 Essential (primary) hypertension: Secondary | ICD-10-CM | POA: Diagnosis not present

## 2021-05-06 DIAGNOSIS — T84022D Instability of internal right knee prosthesis, subsequent encounter: Secondary | ICD-10-CM | POA: Diagnosis not present

## 2021-05-06 DIAGNOSIS — R739 Hyperglycemia, unspecified: Secondary | ICD-10-CM | POA: Diagnosis not present

## 2021-05-06 DIAGNOSIS — K922 Gastrointestinal hemorrhage, unspecified: Secondary | ICD-10-CM | POA: Diagnosis not present

## 2021-05-06 DIAGNOSIS — R52 Pain, unspecified: Secondary | ICD-10-CM | POA: Diagnosis not present

## 2021-05-06 DIAGNOSIS — M069 Rheumatoid arthritis, unspecified: Secondary | ICD-10-CM | POA: Diagnosis not present

## 2021-05-23 ENCOUNTER — Inpatient Hospital Stay: Payer: Medicare Other | Admitting: Hematology & Oncology

## 2021-05-23 ENCOUNTER — Inpatient Hospital Stay: Payer: Medicare Other | Attending: Hematology & Oncology

## 2021-05-28 DIAGNOSIS — Z96651 Presence of right artificial knee joint: Secondary | ICD-10-CM | POA: Diagnosis not present

## 2021-05-28 DIAGNOSIS — Z471 Aftercare following joint replacement surgery: Secondary | ICD-10-CM | POA: Diagnosis not present

## 2021-06-02 DIAGNOSIS — K5900 Constipation, unspecified: Secondary | ICD-10-CM | POA: Diagnosis not present

## 2021-06-02 DIAGNOSIS — Z7689 Persons encountering health services in other specified circumstances: Secondary | ICD-10-CM | POA: Diagnosis not present

## 2021-06-02 DIAGNOSIS — R52 Pain, unspecified: Secondary | ICD-10-CM | POA: Diagnosis not present

## 2021-06-02 DIAGNOSIS — R2689 Other abnormalities of gait and mobility: Secondary | ICD-10-CM | POA: Diagnosis not present

## 2021-06-04 DIAGNOSIS — M0579 Rheumatoid arthritis with rheumatoid factor of multiple sites without organ or systems involvement: Secondary | ICD-10-CM | POA: Diagnosis not present

## 2021-06-06 DIAGNOSIS — Z9181 History of falling: Secondary | ICD-10-CM | POA: Diagnosis not present

## 2021-06-06 DIAGNOSIS — Z7952 Long term (current) use of systemic steroids: Secondary | ICD-10-CM | POA: Diagnosis not present

## 2021-06-06 DIAGNOSIS — D631 Anemia in chronic kidney disease: Secondary | ICD-10-CM | POA: Diagnosis not present

## 2021-06-06 DIAGNOSIS — T84092D Other mechanical complication of internal right knee prosthesis, subsequent encounter: Secondary | ICD-10-CM | POA: Diagnosis not present

## 2021-06-06 DIAGNOSIS — M069 Rheumatoid arthritis, unspecified: Secondary | ICD-10-CM | POA: Diagnosis not present

## 2021-06-06 DIAGNOSIS — K219 Gastro-esophageal reflux disease without esophagitis: Secondary | ICD-10-CM | POA: Diagnosis not present

## 2021-06-06 DIAGNOSIS — N183 Chronic kidney disease, stage 3 unspecified: Secondary | ICD-10-CM | POA: Diagnosis not present

## 2021-06-06 DIAGNOSIS — I129 Hypertensive chronic kidney disease with stage 1 through stage 4 chronic kidney disease, or unspecified chronic kidney disease: Secondary | ICD-10-CM | POA: Diagnosis not present

## 2021-06-06 DIAGNOSIS — Z79891 Long term (current) use of opiate analgesic: Secondary | ICD-10-CM | POA: Diagnosis not present

## 2021-06-06 DIAGNOSIS — Z79899 Other long term (current) drug therapy: Secondary | ICD-10-CM | POA: Diagnosis not present

## 2021-06-10 DIAGNOSIS — K219 Gastro-esophageal reflux disease without esophagitis: Secondary | ICD-10-CM | POA: Diagnosis not present

## 2021-06-10 DIAGNOSIS — D631 Anemia in chronic kidney disease: Secondary | ICD-10-CM | POA: Diagnosis not present

## 2021-06-10 DIAGNOSIS — G894 Chronic pain syndrome: Secondary | ICD-10-CM | POA: Diagnosis not present

## 2021-06-10 DIAGNOSIS — M069 Rheumatoid arthritis, unspecified: Secondary | ICD-10-CM | POA: Diagnosis not present

## 2021-06-10 DIAGNOSIS — N183 Chronic kidney disease, stage 3 unspecified: Secondary | ICD-10-CM | POA: Diagnosis not present

## 2021-06-10 DIAGNOSIS — I129 Hypertensive chronic kidney disease with stage 1 through stage 4 chronic kidney disease, or unspecified chronic kidney disease: Secondary | ICD-10-CM | POA: Diagnosis not present

## 2021-06-10 DIAGNOSIS — T84092D Other mechanical complication of internal right knee prosthesis, subsequent encounter: Secondary | ICD-10-CM | POA: Diagnosis not present

## 2021-06-11 DIAGNOSIS — N183 Chronic kidney disease, stage 3 unspecified: Secondary | ICD-10-CM | POA: Diagnosis not present

## 2021-06-11 DIAGNOSIS — T84092D Other mechanical complication of internal right knee prosthesis, subsequent encounter: Secondary | ICD-10-CM | POA: Diagnosis not present

## 2021-06-11 DIAGNOSIS — K219 Gastro-esophageal reflux disease without esophagitis: Secondary | ICD-10-CM | POA: Diagnosis not present

## 2021-06-11 DIAGNOSIS — D631 Anemia in chronic kidney disease: Secondary | ICD-10-CM | POA: Diagnosis not present

## 2021-06-11 DIAGNOSIS — M069 Rheumatoid arthritis, unspecified: Secondary | ICD-10-CM | POA: Diagnosis not present

## 2021-06-11 DIAGNOSIS — I129 Hypertensive chronic kidney disease with stage 1 through stage 4 chronic kidney disease, or unspecified chronic kidney disease: Secondary | ICD-10-CM | POA: Diagnosis not present

## 2021-06-12 DIAGNOSIS — N183 Chronic kidney disease, stage 3 unspecified: Secondary | ICD-10-CM | POA: Diagnosis not present

## 2021-06-12 DIAGNOSIS — K219 Gastro-esophageal reflux disease without esophagitis: Secondary | ICD-10-CM | POA: Diagnosis not present

## 2021-06-12 DIAGNOSIS — T84092D Other mechanical complication of internal right knee prosthesis, subsequent encounter: Secondary | ICD-10-CM | POA: Diagnosis not present

## 2021-06-12 DIAGNOSIS — M069 Rheumatoid arthritis, unspecified: Secondary | ICD-10-CM | POA: Diagnosis not present

## 2021-06-12 DIAGNOSIS — I129 Hypertensive chronic kidney disease with stage 1 through stage 4 chronic kidney disease, or unspecified chronic kidney disease: Secondary | ICD-10-CM | POA: Diagnosis not present

## 2021-06-12 DIAGNOSIS — D631 Anemia in chronic kidney disease: Secondary | ICD-10-CM | POA: Diagnosis not present

## 2021-06-13 DIAGNOSIS — I10 Essential (primary) hypertension: Secondary | ICD-10-CM | POA: Diagnosis not present

## 2021-06-13 DIAGNOSIS — I129 Hypertensive chronic kidney disease with stage 1 through stage 4 chronic kidney disease, or unspecified chronic kidney disease: Secondary | ICD-10-CM | POA: Diagnosis not present

## 2021-06-13 DIAGNOSIS — D631 Anemia in chronic kidney disease: Secondary | ICD-10-CM | POA: Diagnosis not present

## 2021-06-13 DIAGNOSIS — M069 Rheumatoid arthritis, unspecified: Secondary | ICD-10-CM | POA: Diagnosis not present

## 2021-06-13 DIAGNOSIS — T84092D Other mechanical complication of internal right knee prosthesis, subsequent encounter: Secondary | ICD-10-CM | POA: Diagnosis not present

## 2021-06-13 DIAGNOSIS — N183 Chronic kidney disease, stage 3 unspecified: Secondary | ICD-10-CM | POA: Diagnosis not present

## 2021-06-13 DIAGNOSIS — K219 Gastro-esophageal reflux disease without esophagitis: Secondary | ICD-10-CM | POA: Diagnosis not present

## 2021-06-17 ENCOUNTER — Emergency Department (HOSPITAL_COMMUNITY): Payer: Medicare Other

## 2021-06-17 ENCOUNTER — Emergency Department (HOSPITAL_COMMUNITY)
Admission: EM | Admit: 2021-06-17 | Discharge: 2021-06-17 | Disposition: A | Discharge status: Home / Self Care | Payer: Medicare Other | Attending: Emergency Medicine | Admitting: Emergency Medicine

## 2021-06-17 ENCOUNTER — Other Ambulatory Visit: Payer: Self-pay

## 2021-06-17 ENCOUNTER — Encounter (HOSPITAL_COMMUNITY): Payer: Self-pay | Admitting: Emergency Medicine

## 2021-06-17 DIAGNOSIS — R0902 Hypoxemia: Secondary | ICD-10-CM | POA: Diagnosis not present

## 2021-06-17 DIAGNOSIS — K5900 Constipation, unspecified: Secondary | ICD-10-CM | POA: Diagnosis not present

## 2021-06-17 DIAGNOSIS — R110 Nausea: Secondary | ICD-10-CM | POA: Insufficient documentation

## 2021-06-17 DIAGNOSIS — I129 Hypertensive chronic kidney disease with stage 1 through stage 4 chronic kidney disease, or unspecified chronic kidney disease: Secondary | ICD-10-CM | POA: Insufficient documentation

## 2021-06-17 DIAGNOSIS — N183 Chronic kidney disease, stage 3 unspecified: Secondary | ICD-10-CM | POA: Diagnosis not present

## 2021-06-17 DIAGNOSIS — Z79899 Other long term (current) drug therapy: Secondary | ICD-10-CM | POA: Diagnosis not present

## 2021-06-17 DIAGNOSIS — R1032 Left lower quadrant pain: Secondary | ICD-10-CM | POA: Diagnosis not present

## 2021-06-17 DIAGNOSIS — Z96653 Presence of artificial knee joint, bilateral: Secondary | ICD-10-CM | POA: Insufficient documentation

## 2021-06-17 DIAGNOSIS — K219 Gastro-esophageal reflux disease without esophagitis: Secondary | ICD-10-CM | POA: Insufficient documentation

## 2021-06-17 DIAGNOSIS — I10 Essential (primary) hypertension: Secondary | ICD-10-CM | POA: Diagnosis not present

## 2021-06-17 DIAGNOSIS — E860 Dehydration: Secondary | ICD-10-CM | POA: Insufficient documentation

## 2021-06-17 DIAGNOSIS — R1084 Generalized abdominal pain: Secondary | ICD-10-CM | POA: Diagnosis not present

## 2021-06-17 DIAGNOSIS — R109 Unspecified abdominal pain: Secondary | ICD-10-CM | POA: Diagnosis not present

## 2021-06-17 LAB — CBC WITH DIFFERENTIAL/PLATELET
Abs Immature Granulocytes: 0.06 10*3/uL (ref 0.00–0.07)
Basophils Absolute: 0 10*3/uL (ref 0.0–0.1)
Basophils Relative: 0 %
Eosinophils Absolute: 0 10*3/uL (ref 0.0–0.5)
Eosinophils Relative: 1 %
HCT: 30.9 % — ABNORMAL LOW (ref 36.0–46.0)
Hemoglobin: 9.7 g/dL — ABNORMAL LOW (ref 12.0–15.0)
Immature Granulocytes: 1 %
Lymphocytes Relative: 40 %
Lymphs Abs: 2.3 10*3/uL (ref 0.7–4.0)
MCH: 34.9 pg — ABNORMAL HIGH (ref 26.0–34.0)
MCHC: 31.4 g/dL (ref 30.0–36.0)
MCV: 111.2 fL — ABNORMAL HIGH (ref 80.0–100.0)
Monocytes Absolute: 0.5 10*3/uL (ref 0.1–1.0)
Monocytes Relative: 9 %
Neutro Abs: 2.8 10*3/uL (ref 1.7–7.7)
Neutrophils Relative %: 49 %
Platelets: 166 10*3/uL (ref 150–400)
RBC: 2.78 MIL/uL — ABNORMAL LOW (ref 3.87–5.11)
RDW: 18.4 % — ABNORMAL HIGH (ref 11.5–15.5)
WBC: 5.6 10*3/uL (ref 4.0–10.5)
nRBC: 0.4 % — ABNORMAL HIGH (ref 0.0–0.2)

## 2021-06-17 LAB — COMPREHENSIVE METABOLIC PANEL
ALT: 13 U/L (ref 0–44)
AST: 20 U/L (ref 15–41)
Albumin: 3.7 g/dL (ref 3.5–5.0)
Alkaline Phosphatase: 48 U/L (ref 38–126)
Anion gap: 11 (ref 5–15)
BUN: 44 mg/dL — ABNORMAL HIGH (ref 8–23)
CO2: 29 mmol/L (ref 22–32)
Calcium: 9.2 mg/dL (ref 8.9–10.3)
Chloride: 101 mmol/L (ref 98–111)
Creatinine, Ser: 1.5 mg/dL — ABNORMAL HIGH (ref 0.44–1.00)
GFR, Estimated: 37 mL/min — ABNORMAL LOW (ref >60)
Glucose, Bld: 139 mg/dL — ABNORMAL HIGH (ref 70–99)
Potassium: 4.1 mmol/L (ref 3.5–5.1)
Sodium: 141 mmol/L (ref 135–145)
Total Bilirubin: 0.6 mg/dL (ref 0.3–1.2)
Total Protein: 6.8 g/dL (ref 6.5–8.1)

## 2021-06-17 LAB — LIPASE, BLOOD: Lipase: 22 U/L (ref 11–51)

## 2021-06-17 MED ORDER — IOHEXOL 350 MG/ML SOLN
60.0000 mL | Freq: Once | INTRAVENOUS | Status: AC | PRN
  Administered 2021-06-17: 60 mL via INTRAVENOUS

## 2021-06-17 MED ORDER — ONDANSETRON HCL 4 MG/2ML IJ SOLN
4.0000 mg | Freq: Once | INTRAMUSCULAR | Status: AC
  Administered 2021-06-17: 4 mg via INTRAVENOUS
  Filled 2021-06-17: qty 2

## 2021-06-17 MED ORDER — METAMUCIL 0.52 G PO CAPS: 0.5200 g | ORAL_CAPSULE | Freq: Every day | ORAL | 1 refills | Status: DC

## 2021-06-17 MED ORDER — HYDROMORPHONE HCL 1 MG/ML IJ SOLN
0.5000 mg | Freq: Once | INTRAMUSCULAR | Status: AC
  Administered 2021-06-17: 0.5 mg via INTRAVENOUS
  Filled 2021-06-17: qty 1

## 2021-06-17 MED ORDER — ONDANSETRON 4 MG PO TBDP: ORAL_TABLET | ORAL | 0 refills | Status: DC

## 2021-06-17 MED ORDER — SODIUM CHLORIDE 0.9 % IV BOLUS
1000.0000 mL | Freq: Once | INTRAVENOUS | Status: AC
  Administered 2021-06-17: 1000 mL via INTRAVENOUS

## 2021-06-17 NOTE — ED Provider Notes (Signed)
[facility] DEPT Provider Note   CSN: [id number] Arrival date & time: 06/17/21  8185     History Chief Complaint  Patient presents with   Abdominal Pain   Nausea    Helen Hardy is a 70 y.o. female.  Patient complains of some abdominal discomfort no nausea no vomiting  The history is provided by the patient and medical records. No language interpreter was used.  Abdominal Pain Pain location:  LLQ Pain quality: aching   Pain radiates to:  Does not radiate Pain severity:  Mild Onset quality:  Sudden Timing:  Intermittent Progression:  Waxing and waning Chronicity:  New Context: not alcohol use   Relieved by:  Nothing Associated symptoms: no chest pain, no cough, no diarrhea, no fatigue and no hematuria       Past Medical History:  Diagnosis Date   Anemia    Anemia of chronic renal failure, stage 3 (moderate) (HCC) 12/30/2018   pt denies   GERD (gastroesophageal reflux disease)    History of ischemic colitis    Hospitalized   Hypertension    Malabsorption of iron 06/21/2015   Non Hodgkin's lymphoma (HCC)    Numbness and tingling of right leg    Pneumonia    x2   RA (rheumatoid arthritis) ([place])     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   GI bleed 03/25/2020   SIRS (systemic inflammatory response syndrome) ([place]) 03/25/2020   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   Cecal diverticulitis 12/25/2019   Rheumatoid arthritis ([place]) 12/25/2019   GERD (gastroesophageal reflux disease) 12/25/2019   Essential hypertension 12/25/2019   AKI (acute kidney injury) ([place]) 12/25/2019   Hyperglycemia 12/25/2019   Chronic radicular lumbar pain 12/25/2019   Generalized weakness 12/25/2019   Anemia of chronic renal failure, stage 3 (moderate) (HCC) 12/30/2018   Lumbar disc herniation 03/04/2016   Iron deficiency anemia 06/21/2015   Malabsorption of iron 06/21/2015   Morbid obesity  ([place]) 04/10/2015   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   S/P knee replacement 04/08/2015    Past Surgical History:  Procedure Laterality Date   2012 Right big toe and toe beside - had pins in toes     ABDOMINAL HYSTERECTOMY     BIOPSY  04/01/2020   Procedure: BIOPSY;  Surgeon: Juanita Craver, MD;  Location: [facility];  Service: Gastroenterology;;   bladder tack - 2005     COLONOSCOPY WITH PROPOFOL N/A 04/01/2020   Procedure: COLONOSCOPY WITH PROPOFOL;  Surgeon: Juanita Craver, MD;  Location: [facility];  Service: Gastroenterology;  Laterality: N/A;   EYE SURGERY Bilateral    cataract   I & D KNEE WITH POLY EXCHANGE Right 04/14/2021   Procedure: IRRIGATION AND DEBRIDEMENT KNEE WITH POLY EXCHANGE;  Surgeon: Paralee Cancel, MD;  Location: [facility];  Service: Orthopedics;  Laterality: Right;   JOINT REPLACEMENT Bilateral 1995, 1996   KNEE   KNEE REPAIR EXTENSOR MECHANISM Right 04/14/2021   Procedure: RIGHT KNEE EXTENSOR MECHANISM REPAIR;  Surgeon: Paralee Cancel, MD;  Location: [facility];  Service: Orthopedics;  Laterality: Right;   LUMBAR LAMINECTOMY/DECOMPRESSION MICRODISCECTOMY N/A 03/04/2016   Procedure: RIGHT FAR LATERAL DISCECTOMY L5-S1;  Surgeon: Melina Schools, MD;  Location: [facility];  Service: Orthopedics;  Laterality: N/A;   PORTACATH PLACEMENT     PORTACATH REMOVAL     ROTATOR CUFF REPAIR Left    TOTAL KNEE REVISION Left 04/08/2015   Procedure: REVISION LEFT TOTAL KNEE ;  Surgeon: Paralee Cancel, MD;  Location: [facility];  Service: Orthopedics;  Laterality: Left;   TOTAL KNEE REVISION Right 12/12/2020   Procedure: TOTAL KNEE REVISION;  Surgeon: Paralee Cancel, MD;  Location: [facility];  Service: Orthopedics;  Laterality: Right;     OB History   No obstetric history on file.     History reviewed. No pertinent family history.  Social History   Tobacco Use   Smoking status: Never   Smokeless tobacco: Never  Vaping Use   Vaping Use: Never used  Substance Use Topics   Alcohol use: No     Alcohol/week: 0.0 standard drinks   Drug use: No    Home Medications Prior to Admission medications   Medication Sig Start Date End Date Taking? Authorizing Provider  ondansetron (ZOFRAN-ODT) 4 MG disintegrating tablet 36m ODT q4 hours prn nausea/vomit 06/17/21  Yes ZMilton Ferguson MD  psyllium (METAMUCIL) 0.52 g capsule Take 1 capsule (0.52 g total) by mouth daily. 06/17/21  Yes ZMilton Ferguson MD  acetaminophen (TYLENOL) 500 MG tablet Take 2 tablets (1,000 mg total) by mouth every 8 (eight) hours. Patient not taking: No sig reported 12/17/20   DIrving Copas PA-C  Ascorbic Acid (VITAMIN C) 1000 MG tablet Take 1,000 mg by mouth in the morning.    [provider]  cholecalciferol (VITAMIN D) 25 MCG (1000 UNIT) tablet Take 1,000 Units by mouth in the morning.    [provider]  docusate sodium (COLACE) 100 MG capsule Take 1 capsule (100 mg total) by mouth 2 (two) times daily. Patient taking differently: Take 100 mg by mouth in the morning. 12/17/20   DIrving Copas PA-C  estradiol (ESTRACE) 1 MG tablet Take 1 mg by mouth in the morning. 12/25/12   [provider]  folic acid (FOLVITE) 1 MG tablet Take 2 mg by mouth in the morning.    [provider]  furosemide (LASIX) 40 MG tablet Take 40 mg by mouth in the morning. 04/22/20   [provider]  gabapentin (NEURONTIN) 300 MG capsule Take 300-600 mg by mouth See admin instructions. Take 1 capsule (300 mg) by mouth in the morning, take 1 capsule (300 mg) by mouth in the afternoon & take 2 capsules (600 mg) by mouth at night. 03/17/18   [provider]  leflunomide (ARAVA) 20 MG tablet Take 1 tablet (20 mg total) by mouth in the morning. Hold for two weeks after surgery 04/17/21   DIrving Copas PA-C  methocarbamol (ROBAXIN) 500 MG tablet Take 1 tablet (500 mg total) by mouth every 6 (six) hours as needed for muscle spasms. 04/17/21   DIrving Copas PA-C  Multiple Vitamin  (MULTIVITAMIN WITH MINERALS) TABS tablet Take 1 tablet by mouth in the morning.    [provider]  olmesartan (BENICAR) 20 MG tablet Take 20 mg by mouth in the morning. 12/27/18   [provider]  ondansetron (ZOFRAN) 4 MG tablet Take 4 mg by mouth every 8 (eight) hours as needed for nausea or vomiting.  04/10/19   [provider]  oxyCODONE (OXY IR/ROXICODONE) 5 MG immediate release tablet Take 2-3 tablets (10-15 mg total) by mouth every 4 (four) hours as needed for severe pain (pain score 4-6). 04/17/21   DIrving Copas PA-C  pantoprazole (PROTONIX) 40 MG tablet Take 40 mg by mouth in the morning.    [provider]  polyethylene glycol (MIRALAX / GLYCOLAX) 17 g packet Take 17 g by mouth daily as  needed for mild constipation. Patient not taking: No sig reported 12/17/20   Irving Copas, PA-C  Potassium Chloride ER 20 MEQ TBCR Take 20 mEq by mouth in the morning. 05/09/20   [provider]  predniSONE (DELTASONE) 10 MG tablet Take 10 mg by mouth daily with breakfast. 04/12/18   [provider]  vitamin B-12 (CYANOCOBALAMIN) 1000 MCG tablet Take 1,000 mcg by mouth in the morning.    [provider]    Allergies    Oxycodone and Tramadol  Review of Systems   Review of Systems  Constitutional:  Negative for appetite change and fatigue.  HENT:  Negative for congestion, ear discharge and sinus pressure.   Eyes:  Negative for discharge.  Respiratory:  Negative for cough.   Cardiovascular:  Negative for chest pain.  Gastrointestinal:  Positive for abdominal pain. Negative for diarrhea.  Genitourinary:  Negative for frequency and hematuria.  Musculoskeletal:  Negative for back pain.  Skin:  Negative for rash.  Neurological:  Negative for seizures and headaches.  Psychiatric/Behavioral:  Negative for hallucinations.    Physical Exam Updated Vital Signs BP 138/75    Pulse 69    Temp 98.2 F (36.8 C) (Oral)    Resp (!) 24     SpO2 97%   Physical Exam Vitals and nursing note reviewed.  Constitutional:      Appearance: She is well-developed.  HENT:     Head: Normocephalic.     Nose: Nose normal.  Eyes:     General: No scleral icterus.    Conjunctiva/sclera: Conjunctivae normal.  Neck:     Thyroid: No thyromegaly.  Cardiovascular:     Rate and Rhythm: Normal rate and regular rhythm.     Heart sounds: No murmur heard.   No friction rub. No gallop.  Pulmonary:     Breath sounds: No stridor. No wheezing or rales.  Chest:     Chest wall: No tenderness.  Abdominal:     General: There is no distension.     Tenderness: There is abdominal tenderness. There is no rebound.  Musculoskeletal:        General: Normal range of motion.     Cervical back: Neck supple.  Lymphadenopathy:     Cervical: No cervical adenopathy.  Skin:    Findings: No erythema or rash.  Neurological:     Mental Status: She is alert and oriented to person, place, and time.     Motor: No abnormal muscle tone.     Coordination: Coordination normal.  Psychiatric:        Behavior: Behavior normal.    ED Results / Procedures / Treatments   Labs (all labs ordered are listed, but only abnormal results are displayed) Labs Reviewed  CBC WITH DIFFERENTIAL/PLATELET - Abnormal; Notable for the following components:      Result Value   RBC 2.78 (*)    Hemoglobin 9.7 (*)    HCT 30.9 (*)    MCV 111.2 (*)    MCH 34.9 (*)    RDW 18.4 (*)    nRBC 0.4 (*)    All other components within normal limits  COMPREHENSIVE METABOLIC PANEL - Abnormal; Notable for the following components:   Glucose, Bld 139 (*)    BUN 44 (*)    Creatinine, Ser 1.50 (*)    GFR, Estimated 37 (*)    All other components within normal limits  LIPASE, BLOOD    EKG None  Radiology CT ABDOMEN PELVIS W CONTRAST  Result Date: 06/17/2021 CLINICAL DATA:  Acute abdominal pain, nonlocalized. EXAM: CT ABDOMEN AND PELVIS WITH CONTRAST TECHNIQUE: Multidetector CT imaging  of the abdomen and pelvis was performed using the standard protocol following bolus administration of intravenous contrast. CONTRAST:  35m OMNIPAQUE IOHEXOL 350 MG/ML SOLN COMPARISON:  March 25, 2020. FINDINGS: Lower chest: Basilar atelectasis, no effusion or consolidative changes. Hepatobiliary: Signs of biliary duct dilation. Some dilation seen on previous imaging. Common bile duct in the head of the pancreas up to 10 mm, this is similar to previous imaging. The gallbladder is collapsed without pericholecystic stranding. No focal, suspicious hepatic lesion. The portal vein is patent. Pancreas: Signs of pancreatic atrophy, generalized without ductal dilation or sign of inflammation. Spleen: Normal spleen. Adrenals/Urinary Tract: Adrenal glands are unremarkable. Symmetric renal enhancement. No sign of hydronephrosis. No suspicious renal lesion or perinephric stranding. Urinary bladder is grossly unremarkable. Stomach/Bowel: No signs of stranding adjacent to stomach or small bowel. No small bowel dilation. Small bowel with fluid filled loops throughout without substantial distension. No small bowel wall thickening. The appendix is normal. Signs of colonic diverticulosis. No substantial pericolonic stranding. No abscess. This is a subtle finding and is associated with background diverticular changes. Diverticular changes are seen throughout the entire colon. Vascular/Lymphatic: Aorta with smooth contours. IVC with smooth contours. No aneurysmal dilation of the abdominal aorta. There is no gastrohepatic or hepatoduodenal ligament lymphadenopathy. No retroperitoneal or mesenteric lymphadenopathy. No pelvic sidewall lymphadenopathy. Reproductive: Post hysterectomy without adnexal mass. Other: No ascites.  No free air. Musculoskeletal: No acute bone finding. No destructive bone process. Spinal degenerative changes. IMPRESSION: Moderate stool throughout the colon may reflect mild constipation. Scattered fluid-filled  loops of small bowel could be seen in the setting of gastroenteritis. Persistent biliary duct dilation, unchanged since September of 2021 perhaps mildly increased since June of 2021. Correlate with any symptoms or laboratory evidence of biliary obstruction and consider MRCP imaging as warranted. Normal appendix. Electronically Signed   By: GZetta BillsM.D.   On: 06/17/2021 10:30    Procedures Procedures   Medications Ordered in ED Medications  HYDROmorphone (DILAUDID) injection 0.5 mg (0.5 mg Intravenous Given 06/17/21 0854)  ondansetron (ZOFRAN) injection 4 mg (4 mg Intravenous Given 06/17/21 0854)  sodium chloride 0.9 % bolus 1,000 mL (1,000 mLs Intravenous Bolus 06/17/21 1037)  iohexol (OMNIPAQUE) 350 MG/ML injection 60 mL (60 mLs Intravenous Contrast Given 06/17/21 0951)    ED Course  I have reviewed the triage vital signs and the nursing notes.  Pertinent labs & imaging results that were available during my care of the patient were reviewed by me and considered in my medical decision making (see chart for details). Patient's labs show dehydration and CT scan shows some constipation.   MDM Rules/Calculators/A&P                           Patient with dehydration and constipation.  She is given a liter of fluids here and will be started on Metamucil along with Zofran and follow-up with her GI doctor along with her primary care doctor Final Clinical Impression(s) / ED Diagnoses Final diagnoses:  None    Rx / DC Orders ED Discharge Orders          Ordered    psyllium (METAMUCIL) 0.52 g capsule  Daily        06/17/21 1129    ondansetron (ZOFRAN-ODT) 4 MG disintegrating tablet        06/17/21 1129  Milton Ferguson, MD 06/17/21 [phone]

## 2021-06-17 NOTE — Discharge Instructions (Signed)
Follow-up with Dr. Benson Norway in 3 to 4 weeks for recheck.  Follow-up with Dr. Concha Pyo your family doctor in the next couple weeks to check your kidney function and your hydration

## 2021-06-17 NOTE — ED Triage Notes (Signed)
BIBA  Per EMS: Pt coming from home w/ c/o abd pain since this morning. Does not radiate anywhere.  Pt ate wings last night and states she usually does not eat things like that. Pt does c/o nausea. Hx sm bowel obstruction. Last bowel movement yesterday.  Vitals: 146/75 69 HR  97% RA

## 2021-07-03 DIAGNOSIS — M0579 Rheumatoid arthritis with rheumatoid factor of multiple sites without organ or systems involvement: Secondary | ICD-10-CM | POA: Diagnosis not present

## 2021-07-11 DIAGNOSIS — M25569 Pain in unspecified knee: Secondary | ICD-10-CM | POA: Diagnosis not present

## 2021-07-15 DIAGNOSIS — M25569 Pain in unspecified knee: Secondary | ICD-10-CM | POA: Diagnosis not present

## 2021-07-17 ENCOUNTER — Telehealth: Payer: Self-pay | Admitting: *Deleted

## 2021-07-17 ENCOUNTER — Other Ambulatory Visit: Payer: Self-pay | Admitting: Obstetrics and Gynecology

## 2021-07-17 NOTE — Telephone Encounter (Signed)
Message received from patient stating that she would like to make an appt to come in for her "booster" shot.  Message sent to scheduling.

## 2021-07-22 ENCOUNTER — Inpatient Hospital Stay: Payer: Medicare Other

## 2021-07-22 ENCOUNTER — Inpatient Hospital Stay: Payer: Medicare Other | Attending: Hematology & Oncology

## 2021-07-22 ENCOUNTER — Encounter: Payer: Self-pay | Admitting: Family

## 2021-07-22 ENCOUNTER — Telehealth: Payer: Self-pay | Admitting: *Deleted

## 2021-07-22 ENCOUNTER — Inpatient Hospital Stay (HOSPITAL_BASED_OUTPATIENT_CLINIC_OR_DEPARTMENT_OTHER): Payer: Medicare Other | Admitting: Family

## 2021-07-22 ENCOUNTER — Other Ambulatory Visit: Payer: Self-pay

## 2021-07-22 VITALS — BP 89/69 | HR 70 | Temp 97.9°F | Resp 18

## 2021-07-22 DIAGNOSIS — N189 Chronic kidney disease, unspecified: Secondary | ICD-10-CM | POA: Insufficient documentation

## 2021-07-22 DIAGNOSIS — D500 Iron deficiency anemia secondary to blood loss (chronic): Secondary | ICD-10-CM

## 2021-07-22 DIAGNOSIS — Z8572 Personal history of non-Hodgkin lymphomas: Secondary | ICD-10-CM | POA: Diagnosis not present

## 2021-07-22 DIAGNOSIS — I129 Hypertensive chronic kidney disease with stage 1 through stage 4 chronic kidney disease, or unspecified chronic kidney disease: Secondary | ICD-10-CM | POA: Diagnosis not present

## 2021-07-22 DIAGNOSIS — D631 Anemia in chronic kidney disease: Secondary | ICD-10-CM

## 2021-07-22 DIAGNOSIS — N1831 Chronic kidney disease, stage 3a: Secondary | ICD-10-CM | POA: Insufficient documentation

## 2021-07-22 DIAGNOSIS — M7989 Other specified soft tissue disorders: Secondary | ICD-10-CM | POA: Insufficient documentation

## 2021-07-22 DIAGNOSIS — Z79899 Other long term (current) drug therapy: Secondary | ICD-10-CM | POA: Diagnosis not present

## 2021-07-22 DIAGNOSIS — K909 Intestinal malabsorption, unspecified: Secondary | ICD-10-CM

## 2021-07-22 LAB — CBC WITH DIFFERENTIAL (CANCER CENTER ONLY)
Abs Immature Granulocytes: 0.06 10*3/uL (ref 0.00–0.07)
Basophils Absolute: 0 10*3/uL (ref 0.0–0.1)
Basophils Relative: 0 %
Eosinophils Absolute: 0 10*3/uL (ref 0.0–0.5)
Eosinophils Relative: 0 %
HCT: 27.5 % — ABNORMAL LOW (ref 36.0–46.0)
Hemoglobin: 8.6 g/dL — ABNORMAL LOW (ref 12.0–15.0)
Immature Granulocytes: 1 %
Lymphocytes Relative: 35 %
Lymphs Abs: 1.7 10*3/uL (ref 0.7–4.0)
MCH: 36.3 pg — ABNORMAL HIGH (ref 26.0–34.0)
MCHC: 31.3 g/dL (ref 30.0–36.0)
MCV: 116 fL — ABNORMAL HIGH (ref 80.0–100.0)
Monocytes Absolute: 0.3 10*3/uL (ref 0.1–1.0)
Monocytes Relative: 7 %
Neutro Abs: 2.6 10*3/uL (ref 1.7–7.7)
Neutrophils Relative %: 57 %
Platelet Count: 131 10*3/uL — ABNORMAL LOW (ref 150–400)
RBC: 2.37 MIL/uL — ABNORMAL LOW (ref 3.87–5.11)
RDW: 17.7 % — ABNORMAL HIGH (ref 11.5–15.5)
WBC Count: 4.7 10*3/uL (ref 4.0–10.5)
nRBC: 0.9 % — ABNORMAL HIGH (ref 0.0–0.2)

## 2021-07-22 LAB — CMP (CANCER CENTER ONLY)
ALT: 10 U/L (ref 0–44)
AST: 14 U/L — ABNORMAL LOW (ref 15–41)
Albumin: 4 g/dL (ref 3.5–5.0)
Alkaline Phosphatase: 49 U/L (ref 38–126)
Anion gap: 8 (ref 5–15)
BUN: 26 mg/dL — ABNORMAL HIGH (ref 8–23)
CO2: 26 mmol/L (ref 22–32)
Calcium: 9.3 mg/dL (ref 8.9–10.3)
Chloride: 109 mmol/L (ref 98–111)
Creatinine: 1.13 mg/dL — ABNORMAL HIGH (ref 0.44–1.00)
GFR, Estimated: 52 mL/min — ABNORMAL LOW (ref >60)
Glucose, Bld: 104 mg/dL — ABNORMAL HIGH (ref 70–99)
Potassium: 5.1 mmol/L (ref 3.5–5.1)
Sodium: 143 mmol/L (ref 135–145)
Total Bilirubin: 0.4 mg/dL (ref 0.3–1.2)
Total Protein: 6.4 g/dL — ABNORMAL LOW (ref 6.5–8.1)

## 2021-07-22 LAB — IRON AND IRON BINDING CAPACITY (CC-WL,HP ONLY)
Iron: 116 ug/dL (ref 28–170)
Saturation Ratios: 41 % — ABNORMAL HIGH (ref 10.4–31.8)
TIBC: 280 ug/dL (ref 250–450)
UIBC: 164 ug/dL (ref 148–442)

## 2021-07-22 LAB — RETICULOCYTES
Immature Retic Fract: 31.9 % — ABNORMAL HIGH (ref 2.3–15.9)
RBC.: 2.35 MIL/uL — ABNORMAL LOW (ref 3.87–5.11)
Retic Count, Absolute: 99.6 10*3/uL (ref 19.0–186.0)
Retic Ct Pct: 4.2 % — ABNORMAL HIGH (ref 0.4–3.1)

## 2021-07-22 MED ORDER — EPOETIN ALFA-EPBX 40000 UNIT/ML IJ SOLN
40000.0000 [IU] | Freq: Once | INTRAMUSCULAR | Status: AC
  Administered 2021-07-22: 40000 [IU] via SUBCUTANEOUS
  Filled 2021-07-22: qty 1

## 2021-07-22 NOTE — Patient Instructions (Signed)

## 2021-07-22 NOTE — Progress Notes (Signed)
Hematology and Oncology Follow Up Visit  Helen Hardy [id number] 1951/06/25 70 y.o. 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 We last saw her in October 2022. She had right knee surgery soon after that visit and spent quite a bit of time in rehab. She is finally home and able to resume her follow-up appointments.  She has a knee brace on and is having modified therapy throughout the week. She states that she is tired and weak. Hgb is 8.6, MCV 116, platelets 131 and WBC count 4.7.  No blood loss noted. No bruising or petechiae.  No fever, chills, n/v, cough, rash, dizziness, SOB, chest pain, palpitations, abdominal pain or changes in bowel or bladder habits.  Swelling noted in bother lower extremities. This is more prominent in the right lower extremity post surgery and wearing the immobilizing brace.  Pedal pulses are 1+.  No numbness or tingling in her extremities at this time.  No falls or syncope to report.  She states that she is eating well and doing her best to stay well hydrated.   ECOG Performance Status: 2 - Symptomatic, <50% confined to bed  Medications:  Allergies as of 07/22/2021       Reactions   Oxycodone Nausea And Vomiting   Able tolerate with antinausea medicine, Pt is currently taking   Tramadol Other (See Comments)   UNKNOWN REACTION        Medication List        Accurate as of July 22, 2021  4:29 PM. If you have any questions, ask your nurse or doctor.          acetaminophen 500 MG tablet Commonly known as: TYLENOL Take 2 tablets (1,000 mg total) by mouth every 8 (eight) hours.   cholecalciferol 25 MCG (1000 UNIT)  tablet Commonly known as: VITAMIN D Take 1,000 Units by mouth in the morning.   docusate sodium 100 MG capsule Commonly known as: COLACE Take 1 capsule (100 mg total) by mouth 2 (two) times daily. What changed: when to take this   estradiol 1 MG tablet Commonly known as: ESTRACE Take 1 mg by mouth in the morning.   folic acid 1 MG tablet Commonly known as: FOLVITE Take 2 mg by mouth in the morning.   furosemide 40 MG tablet Commonly known as: LASIX Take 40 mg by mouth in the morning.   gabapentin 300 MG capsule Commonly known as: NEURONTIN Take 300-600 mg by mouth See admin instructions. Take 1 capsule (300 mg) by mouth in the morning, take 1 capsule (300 mg) by mouth in the afternoon & take 2 capsules (600 mg) by mouth at night.   leflunomide 20 MG tablet Commonly known as: ARAVA Take 1 tablet (20 mg total) by mouth in the morning. Hold for two weeks after surgery   Metamucil 0.52 g capsule Generic drug: psyllium Take 1 capsule (0.52 g total) by mouth daily.   methocarbamol 500 MG tablet Commonly known as: ROBAXIN Take 1 tablet (500 mg total) by mouth every 6 (six) hours as needed for muscle spasms.   multivitamin with minerals Tabs tablet Take 1 tablet by mouth in the  morning.   olmesartan 20 MG tablet Commonly known as: BENICAR Take 20 mg by mouth in the morning.   ondansetron 4 MG disintegrating tablet Commonly known as: ZOFRAN-ODT 108m ODT q4 hours prn nausea/vomit   ondansetron 4 MG tablet Commonly known as: ZOFRAN Take 4 mg by mouth every 8 (eight) hours as needed for nausea or vomiting.   oxyCODONE 5 MG immediate release tablet Commonly known as: Oxy IR/ROXICODONE Take 2-3 tablets (10-15 mg total) by mouth every 4 (four) hours as needed for severe pain (pain score 4-6).   pantoprazole 40 MG tablet Commonly known as: PROTONIX Take 40 mg by mouth in the morning.   polyethylene glycol 17 g packet Commonly known as: MIRALAX / GLYCOLAX Take 17 g by  mouth daily as needed for mild constipation.   Potassium Chloride ER 20 MEQ Tbcr Take 20 mEq by mouth in the morning.   predniSONE 10 MG tablet Commonly known as: DELTASONE Take 10 mg by mouth daily with breakfast.   vitamin B-12 1000 MCG tablet Commonly known as: CYANOCOBALAMIN Take 1,000 mcg by mouth in the morning.   vitamin C 1000 MG tablet Take 1,000 mg by mouth in the morning.        Allergies:  Allergies  Allergen Reactions   Oxycodone Nausea And Vomiting    Able tolerate with antinausea medicine, Pt is currently taking   Tramadol Other (See Comments)    UNKNOWN REACTION    Past Medical History, Surgical history, Social history, and Family History were reviewed and updated.  Review of Systems: All other 10 point review of systems is negative.   Physical Exam:  oral temperature is 97.9 F (36.6 C). Her blood pressure is 89/69 (abnormal) and her pulse is 70. Her respiration is 18 and oxygen saturation is 99%.   Wt Readings from Last 3 Encounters:  04/14/21 216 lb (98 kg)  03/11/21 206 lb (93.4 kg)  02/21/21 206 lb (93.4 kg)    Ocular: Sclerae unicteric, pupils equal, round and reactive to light Ear-nose-throat: Oropharynx clear, dentition fair Lymphatic: No cervical or supraclavicular adenopathy Lungs no rales or rhonchi, good excursion bilaterally Heart regular rate and rhythm, no murmur appreciated Abd soft, nontender, positive bowel sounds MSK no focal spinal tenderness, no joint edema Neuro: non-focal, well-oriented, appropriate affect Breasts: Deferred    Lab Results  Component Value Date   WBC 4.7 07/22/2021   HGB 8.6 (L) 07/22/2021   HCT 27.5 (L) 07/22/2021   MCV 116.0 (H) 07/22/2021   PLT 131 (L) 07/22/2021   Lab Results  Component Value Date   FERRITIN 1,447 (H) 04/09/2021   IRON 116 07/22/2021   TIBC 280 07/22/2021   UIBC 164 07/22/2021   IRONPCTSAT 41 (H) 07/22/2021   Lab Results  Component Value Date   RETICCTPCT 4.2 (H)  07/22/2021   RBC 2.35 (L) 07/22/2021   RETICCTABS 41.0 06/21/2015   No results found for: KPAFRELGTCHN, LAMBDASER, KAPLAMBRATIO No results found for: IGGSERUM, IGA, IGMSERUM No results found for: TOdetta Pink SPEI   Chemistry      Component Value Date/Time   NA 143 07/22/2021 1308   NA 144 04/29/2017 0929   K 5.1 07/22/2021 1308   K 4.0 04/29/2017 0929   CL 109 07/22/2021 1308   CL 103 06/21/2015 0800   CO2 26 07/22/2021 1308   CO2 25 04/29/2017 0929   BUN 26 (H) 07/22/2021 1308   BUN 17.7 04/29/2017 0929   CREATININE 1.13 (  H) 07/22/2021 1308   CREATININE 0.8 04/29/2017 0929      Component Value Date/Time   CALCIUM 9.3 07/22/2021 1308   CALCIUM 9.3 04/29/2017 0929   ALKPHOS 49 07/22/2021 1308   ALKPHOS 55 04/29/2017 0929   AST 14 (L) 07/22/2021 1308   AST 19 04/29/2017 0929   ALT 10 07/22/2021 1308   ALT 16 04/29/2017 0929   BILITOT 0.4 07/22/2021 1308   BILITOT 0.43 04/29/2017 0929       Impression and Plan:  Helen Hardy is a very pleasant 71 yo African American female with 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 She has multifactorial anemia.  ESA given today, Hgb 8.6.  Iron studies are pending. We will replace if needed.  Follow-up in 4 weeks.   Lottie Dawson, NP 1/17/20234:29 PM

## 2021-07-22 NOTE — Telephone Encounter (Signed)
Per 07/22/21 los - gave upcoming appointments - confirmed

## 2021-07-23 DIAGNOSIS — M25569 Pain in unspecified knee: Secondary | ICD-10-CM | POA: Diagnosis not present

## 2021-07-23 LAB — FERRITIN: Ferritin: 1741 ng/mL — ABNORMAL HIGH (ref 11–307)

## 2021-07-28 ENCOUNTER — Telehealth: Payer: Self-pay | Admitting: *Deleted

## 2021-07-28 NOTE — Telephone Encounter (Signed)
Called patient to let her know that her iron studies all looked very good.  Responding to a call she placed to the triage call center this weekend.  Patient appreciates call.

## 2021-07-30 DIAGNOSIS — M25569 Pain in unspecified knee: Secondary | ICD-10-CM | POA: Diagnosis not present

## 2021-07-30 DIAGNOSIS — Z4789 Encounter for other orthopedic aftercare: Secondary | ICD-10-CM | POA: Diagnosis not present

## 2021-08-01 DIAGNOSIS — M0579 Rheumatoid arthritis with rheumatoid factor of multiple sites without organ or systems involvement: Secondary | ICD-10-CM | POA: Diagnosis not present

## 2021-08-04 DIAGNOSIS — I10 Essential (primary) hypertension: Secondary | ICD-10-CM | POA: Diagnosis not present

## 2021-08-04 DIAGNOSIS — R262 Difficulty in walking, not elsewhere classified: Secondary | ICD-10-CM | POA: Diagnosis not present

## 2021-08-04 DIAGNOSIS — M069 Rheumatoid arthritis, unspecified: Secondary | ICD-10-CM | POA: Diagnosis not present

## 2021-08-04 DIAGNOSIS — K219 Gastro-esophageal reflux disease without esophagitis: Secondary | ICD-10-CM | POA: Diagnosis not present

## 2021-08-05 DIAGNOSIS — M25569 Pain in unspecified knee: Secondary | ICD-10-CM | POA: Diagnosis not present

## 2021-08-08 DIAGNOSIS — M25569 Pain in unspecified knee: Secondary | ICD-10-CM | POA: Diagnosis not present

## 2021-08-12 DIAGNOSIS — M25569 Pain in unspecified knee: Secondary | ICD-10-CM | POA: Diagnosis not present

## 2021-08-15 DIAGNOSIS — M25569 Pain in unspecified knee: Secondary | ICD-10-CM | POA: Diagnosis not present

## 2021-08-18 DIAGNOSIS — M25569 Pain in unspecified knee: Secondary | ICD-10-CM | POA: Diagnosis not present

## 2021-08-19 ENCOUNTER — Inpatient Hospital Stay (HOSPITAL_BASED_OUTPATIENT_CLINIC_OR_DEPARTMENT_OTHER): Payer: Medicare Other | Admitting: Family

## 2021-08-19 ENCOUNTER — Encounter: Payer: Self-pay | Admitting: Family

## 2021-08-19 ENCOUNTER — Inpatient Hospital Stay: Payer: Medicare Other | Attending: Hematology & Oncology

## 2021-08-19 ENCOUNTER — Inpatient Hospital Stay: Payer: Medicare Other

## 2021-08-19 ENCOUNTER — Other Ambulatory Visit: Payer: Self-pay

## 2021-08-19 VITALS — BP 107/63 | HR 88 | Temp 98.1°F | Resp 19 | Ht 59.0 in

## 2021-08-19 DIAGNOSIS — D631 Anemia in chronic kidney disease: Secondary | ICD-10-CM | POA: Insufficient documentation

## 2021-08-19 DIAGNOSIS — K909 Intestinal malabsorption, unspecified: Secondary | ICD-10-CM

## 2021-08-19 DIAGNOSIS — N1831 Chronic kidney disease, stage 3a: Secondary | ICD-10-CM | POA: Insufficient documentation

## 2021-08-19 DIAGNOSIS — E611 Iron deficiency: Secondary | ICD-10-CM | POA: Diagnosis not present

## 2021-08-19 DIAGNOSIS — N183 Chronic kidney disease, stage 3 unspecified: Secondary | ICD-10-CM | POA: Diagnosis not present

## 2021-08-19 DIAGNOSIS — D500 Iron deficiency anemia secondary to blood loss (chronic): Secondary | ICD-10-CM | POA: Diagnosis not present

## 2021-08-19 DIAGNOSIS — D509 Iron deficiency anemia, unspecified: Secondary | ICD-10-CM | POA: Insufficient documentation

## 2021-08-19 LAB — CMP (CANCER CENTER ONLY)
ALT: 11 U/L (ref 0–44)
AST: 15 U/L (ref 15–41)
Albumin: 3.8 g/dL (ref 3.5–5.0)
Alkaline Phosphatase: 55 U/L (ref 38–126)
Anion gap: 8 (ref 5–15)
BUN: 40 mg/dL — ABNORMAL HIGH (ref 8–23)
CO2: 25 mmol/L (ref 22–32)
Calcium: 8.7 mg/dL — ABNORMAL LOW (ref 8.9–10.3)
Chloride: 106 mmol/L (ref 98–111)
Creatinine: 1.98 mg/dL — ABNORMAL HIGH (ref 0.44–1.00)
GFR, Estimated: 26 mL/min — ABNORMAL LOW (ref >60)
Glucose, Bld: 125 mg/dL — ABNORMAL HIGH (ref 70–99)
Potassium: 4.4 mmol/L (ref 3.5–5.1)
Sodium: 139 mmol/L (ref 135–145)
Total Bilirubin: 0.3 mg/dL (ref 0.3–1.2)
Total Protein: 6.4 g/dL — ABNORMAL LOW (ref 6.5–8.1)

## 2021-08-19 LAB — CBC WITH DIFFERENTIAL (CANCER CENTER ONLY)
Abs Immature Granulocytes: 0.05 10*3/uL (ref 0.00–0.07)
Basophils Absolute: 0 10*3/uL (ref 0.0–0.1)
Basophils Relative: 0 %
Eosinophils Absolute: 0 10*3/uL (ref 0.0–0.5)
Eosinophils Relative: 0 %
HCT: 31.7 % — ABNORMAL LOW (ref 36.0–46.0)
Hemoglobin: 10 g/dL — ABNORMAL LOW (ref 12.0–15.0)
Immature Granulocytes: 1 %
Lymphocytes Relative: 31 %
Lymphs Abs: 2.1 10*3/uL (ref 0.7–4.0)
MCH: 36.8 pg — ABNORMAL HIGH (ref 26.0–34.0)
MCHC: 31.5 g/dL (ref 30.0–36.0)
MCV: 116.5 fL — ABNORMAL HIGH (ref 80.0–100.0)
Monocytes Absolute: 0.7 10*3/uL (ref 0.1–1.0)
Monocytes Relative: 10 %
Neutro Abs: 4 10*3/uL (ref 1.7–7.7)
Neutrophils Relative %: 58 %
Platelet Count: 142 10*3/uL — ABNORMAL LOW (ref 150–400)
RBC: 2.72 MIL/uL — ABNORMAL LOW (ref 3.87–5.11)
RDW: 15.6 % — ABNORMAL HIGH (ref 11.5–15.5)
WBC Count: 6.9 10*3/uL (ref 4.0–10.5)
nRBC: 0 % (ref 0.0–0.2)

## 2021-08-19 LAB — RETICULOCYTES
Immature Retic Fract: 24.9 % — ABNORMAL HIGH (ref 2.3–15.9)
RBC.: 2.71 MIL/uL — ABNORMAL LOW (ref 3.87–5.11)
Retic Count, Absolute: 74.3 10*3/uL (ref 19.0–186.0)
Retic Ct Pct: 2.7 % (ref 0.4–3.1)

## 2021-08-19 MED ORDER — EPOETIN ALFA-EPBX 40000 UNIT/ML IJ SOLN
40000.0000 [IU] | Freq: Once | INTRAMUSCULAR | Status: AC
  Administered 2021-08-19: 40000 [IU] via SUBCUTANEOUS
  Filled 2021-08-19: qty 1

## 2021-08-19 NOTE — Progress Notes (Signed)
Hematology and Oncology Follow Up Visit  Helen Hardy [id number] [birth year] 71 y.o. 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 She is doing well. She got her right knee immobilizer off and states that once her right knee has healed she will start PT twice a week. Eventually she will also need to have right foot surgery.  She is in a wheelchair today.  No numbness or tingling in her extremities.  No falls or syncope to report.  She has maintained a good appetite and is doing her best to stay well hydrated. No fever, chills, n/v, cough, rash, dizziness, SOB, chest pain, palpitations, abdominal pain or changes in bowel or bladder habits.  No blood loss noted. No bruising or petechiae.   ECOG Performance Status: 2 - Symptomatic, <50% confined to bed  Medications:  Allergies as of 08/19/2021       Reactions   Oxycodone Nausea And Vomiting   Able tolerate with antinausea medicine, Pt is currently taking   Tramadol Other (See Comments)   UNKNOWN REACTION        Medication List        Accurate as of August 19, 2021  3:04 PM. If you have any questions, ask your nurse or doctor.          acetaminophen 500 MG tablet Commonly known as: TYLENOL Take 2 tablets (1,000 mg total) by mouth every 8 (eight) hours.   cholecalciferol 25 MCG (1000 UNIT) tablet Commonly known as: VITAMIN D Take 1,000 Units by mouth in the morning.   docusate sodium 100 MG capsule Commonly known as: COLACE Take 1 capsule (100 mg total) by mouth 2 (two) times daily. What changed: when to take this   estradiol 1 MG tablet Commonly known as: ESTRACE Take 1 mg by mouth in the  morning.   folic acid 1 MG tablet Commonly known as: FOLVITE Take 2 mg by mouth in the morning.   furosemide 40 MG tablet Commonly known as: LASIX Take 40 mg by mouth in the morning.   gabapentin 300 MG capsule Commonly known as: NEURONTIN Take 300-600 mg by mouth See admin instructions. Take 1 capsule (300 mg) by mouth in the morning, take 1 capsule (300 mg) by mouth in the afternoon & take 2 capsules (600 mg) by mouth at night.   leflunomide 20 MG tablet Commonly known as: ARAVA Take 1 tablet (20 mg total) by mouth in the morning. Hold for two weeks after surgery   Metamucil 0.52 g capsule Generic drug: psyllium Take 1 capsule (0.52 g total) by mouth daily.   methocarbamol 500 MG tablet Commonly known as: ROBAXIN Take 1 tablet (500 mg total) by mouth every 6 (six) hours as needed for muscle spasms.   multivitamin with minerals Tabs tablet Take 1 tablet by mouth in the morning.   olmesartan 20 MG tablet Commonly known as: BENICAR Take 20 mg by mouth in the morning.   ondansetron 4 MG disintegrating tablet Commonly known as: ZOFRAN-ODT 59m ODT q4 hours prn nausea/vomit   ondansetron 4 MG tablet Commonly known as: ZOFRAN Take 4 mg by mouth every 8 (eight) hours as needed for nausea  or vomiting.   oxyCODONE 5 MG immediate release tablet Commonly known as: Oxy IR/ROXICODONE Take 2-3 tablets (10-15 mg total) by mouth every 4 (four) hours as needed for severe pain (pain score 4-6).   pantoprazole 40 MG tablet Commonly known as: PROTONIX Take 40 mg by mouth in the morning.   polyethylene glycol 17 g packet Commonly known as: MIRALAX / GLYCOLAX Take 17 g by mouth daily as needed for mild constipation.   Potassium Chloride ER 20 MEQ Tbcr Take 20 mEq by mouth in the morning.   predniSONE 10 MG tablet Commonly known as: DELTASONE Take 10 mg by mouth daily with breakfast.   vitamin B-12 1000 MCG tablet Commonly known as: CYANOCOBALAMIN Take 1,000 mcg by mouth in the  morning.   vitamin C 1000 MG tablet Take 1,000 mg by mouth in the morning.        Allergies:  Allergies  Allergen Reactions   Oxycodone Nausea And Vomiting    Able tolerate with antinausea medicine, Pt is currently taking   Tramadol Other (See Comments)    UNKNOWN REACTION    Past Medical History, Surgical history, Social history, and Family History were reviewed and updated.  Review of Systems: All other 10 point review of systems is negative.   Physical Exam:  height is 4' 11"  (1.499 m). Her oral temperature is 98.1 F (36.7 C). Her blood pressure is 107/63 and her pulse is 88. Her respiration is 19 and oxygen saturation is 98%.   Wt Readings from Last 3 Encounters:  04/14/21 216 lb (98 kg)  03/11/21 206 lb (93.4 kg)  02/21/21 206 lb (93.4 kg)    Ocular: Sclerae unicteric, pupils equal, round and reactive to light Ear-nose-throat: Oropharynx clear, dentition fair Lymphatic: No cervical or supraclavicular adenopathy Lungs no rales or rhonchi, good excursion bilaterally Heart regular rate and rhythm, no murmur appreciated Abd soft, nontender, positive bowel sounds MSK no focal spinal tenderness, no joint edema Neuro: non-focal, well-oriented, appropriate affect Breasts: Deferred   Lab Results  Component Value Date   WBC 6.9 08/19/2021   HGB 10.0 (L) 08/19/2021   HCT 31.7 (L) 08/19/2021   MCV 116.5 (H) 08/19/2021   PLT 142 (L) 08/19/2021   Lab Results  Component Value Date   FERRITIN 1,741 (H) 07/22/2021   IRON 116 07/22/2021   TIBC 280 07/22/2021   UIBC 164 07/22/2021   IRONPCTSAT 41 (H) 07/22/2021   Lab Results  Component Value Date   RETICCTPCT 2.7 08/19/2021   RBC 2.71 (L) 08/19/2021   RETICCTABS 41.0 06/21/2015   No results found for: KPAFRELGTCHN, LAMBDASER, KAPLAMBRATIO No results found for: IGGSERUM, IGA, IGMSERUM No results found for: Odetta Pink, SPEI   Chemistry      Component Value  Date/Time   NA 139 08/19/2021 1405   NA 144 04/29/2017 0929   K 4.4 08/19/2021 1405   K 4.0 04/29/2017 0929   CL 106 08/19/2021 1405   CL 103 06/21/2015 0800   CO2 25 08/19/2021 1405   CO2 25 04/29/2017 0929   BUN 40 (H) 08/19/2021 1405   BUN 17.7 04/29/2017 0929   CREATININE 1.98 (H) 08/19/2021 1405   CREATININE 0.8 04/29/2017 0929      Component Value Date/Time   CALCIUM 8.7 (L) 08/19/2021 1405   CALCIUM 9.3 04/29/2017 0929   ALKPHOS 55 08/19/2021 1405   ALKPHOS 55 04/29/2017 0929   AST 15 08/19/2021 1405   AST 19 04/29/2017 0929  ALT 11 08/19/2021 1405   ALT 16 04/29/2017 0929   BILITOT 0.3 08/19/2021 1405   BILITOT 0.43 04/29/2017 0929       Impression and Plan: Ms. Mikkelsen is a very pleasant 71 yo African American female with 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 She has multifactorial anemia.  ESA given today, Hgb 10.0.  Iron studies pending.  Follow-up in 1 month.   Lottie Dawson, NP 2/14/20233:04 PM

## 2021-08-19 NOTE — Patient Instructions (Signed)

## 2021-08-20 LAB — IRON AND IRON BINDING CAPACITY (CC-WL,HP ONLY)
Iron: 100 ug/dL (ref 28–170)
Saturation Ratios: 36 % — ABNORMAL HIGH (ref 10.4–31.8)
TIBC: 281 ug/dL (ref 250–450)
UIBC: 181 ug/dL (ref 148–442)

## 2021-08-20 LAB — FERRITIN: Ferritin: 1491 ng/mL — ABNORMAL HIGH (ref 11–307)

## 2021-08-21 ENCOUNTER — Telehealth: Payer: Self-pay | Admitting: *Deleted

## 2021-08-21 NOTE — Telephone Encounter (Signed)
Per 08/19/20 los - called and gave upcoming appointments - confirmed

## 2021-08-25 DIAGNOSIS — M25561 Pain in right knee: Secondary | ICD-10-CM | POA: Diagnosis not present

## 2021-08-26 DIAGNOSIS — I10 Essential (primary) hypertension: Secondary | ICD-10-CM | POA: Diagnosis not present

## 2021-08-26 DIAGNOSIS — R262 Difficulty in walking, not elsewhere classified: Secondary | ICD-10-CM | POA: Diagnosis not present

## 2021-08-26 DIAGNOSIS — K219 Gastro-esophageal reflux disease without esophagitis: Secondary | ICD-10-CM | POA: Diagnosis not present

## 2021-08-26 DIAGNOSIS — M069 Rheumatoid arthritis, unspecified: Secondary | ICD-10-CM | POA: Diagnosis not present

## 2021-08-27 NOTE — Progress Notes (Signed)
Stage 3a   CKD Staging Criteria: UpToDate Definition and staging of chronic kidney disease in adults (last updated 12/22/2019):  Definition: Chronic Kindey disease (CKD) is defined based on the presence of either kidney damage or decreased kidney function for = 3 months, irrespective of cause. CKD Stage 1 - GFR=90 CKD Stage 2 - GFR 60 to 89 (mildly decreased) CKD Stage 3a - GFR 45 to 59 (mildly to moderately decreased) CKD Stage 3b - GFR 30 to 44 (Moderately to severely decreased CKD Stage 4 - GFR 15 to 29 (Severly decreased) CKD Stage 5 - GFR < 15 End Stage Kidney failure (add D if treated by dialysis) Please exercise your independent, professional judgment when responding.

## 2021-08-29 DIAGNOSIS — M0579 Rheumatoid arthritis with rheumatoid factor of multiple sites without organ or systems involvement: Secondary | ICD-10-CM | POA: Diagnosis not present

## 2021-09-02 DIAGNOSIS — H02831 Dermatochalasis of right upper eyelid: Secondary | ICD-10-CM | POA: Diagnosis not present

## 2021-09-02 DIAGNOSIS — I10 Essential (primary) hypertension: Secondary | ICD-10-CM | POA: Diagnosis not present

## 2021-09-02 DIAGNOSIS — Z961 Presence of intraocular lens: Secondary | ICD-10-CM | POA: Diagnosis not present

## 2021-09-02 DIAGNOSIS — H18413 Arcus senilis, bilateral: Secondary | ICD-10-CM | POA: Diagnosis not present

## 2021-09-10 DIAGNOSIS — Z96651 Presence of right artificial knee joint: Secondary | ICD-10-CM | POA: Diagnosis not present

## 2021-09-12 DIAGNOSIS — M25561 Pain in right knee: Secondary | ICD-10-CM | POA: Diagnosis not present

## 2021-09-15 DIAGNOSIS — M25561 Pain in right knee: Secondary | ICD-10-CM | POA: Diagnosis not present

## 2021-09-16 ENCOUNTER — Other Ambulatory Visit: Payer: Self-pay

## 2021-09-16 ENCOUNTER — Inpatient Hospital Stay (HOSPITAL_BASED_OUTPATIENT_CLINIC_OR_DEPARTMENT_OTHER): Payer: Medicare Other | Admitting: Family

## 2021-09-16 ENCOUNTER — Inpatient Hospital Stay: Payer: Medicare Other

## 2021-09-16 ENCOUNTER — Encounter: Payer: Self-pay | Admitting: Family

## 2021-09-16 ENCOUNTER — Inpatient Hospital Stay: Payer: Medicare Other | Attending: Hematology & Oncology

## 2021-09-16 VITALS — BP 147/80 | HR 85 | Temp 98.0°F | Resp 17 | Ht 59.0 in

## 2021-09-16 DIAGNOSIS — N183 Chronic kidney disease, stage 3 unspecified: Secondary | ICD-10-CM

## 2021-09-16 DIAGNOSIS — N1831 Chronic kidney disease, stage 3a: Secondary | ICD-10-CM | POA: Diagnosis not present

## 2021-09-16 DIAGNOSIS — D631 Anemia in chronic kidney disease: Secondary | ICD-10-CM

## 2021-09-16 DIAGNOSIS — K909 Intestinal malabsorption, unspecified: Secondary | ICD-10-CM

## 2021-09-16 DIAGNOSIS — I129 Hypertensive chronic kidney disease with stage 1 through stage 4 chronic kidney disease, or unspecified chronic kidney disease: Secondary | ICD-10-CM | POA: Insufficient documentation

## 2021-09-16 DIAGNOSIS — D500 Iron deficiency anemia secondary to blood loss (chronic): Secondary | ICD-10-CM

## 2021-09-16 DIAGNOSIS — Z79899 Other long term (current) drug therapy: Secondary | ICD-10-CM | POA: Diagnosis not present

## 2021-09-16 LAB — CMP (CANCER CENTER ONLY)
ALT: 16 U/L (ref 0–44)
AST: 25 U/L (ref 15–41)
Albumin: 3.9 g/dL (ref 3.5–5.0)
Alkaline Phosphatase: 54 U/L (ref 38–126)
Anion gap: 12 (ref 5–15)
BUN: 22 mg/dL (ref 8–23)
CO2: 26 mmol/L (ref 22–32)
Calcium: 9.5 mg/dL (ref 8.9–10.3)
Chloride: 105 mmol/L (ref 98–111)
Creatinine: 1.36 mg/dL — ABNORMAL HIGH (ref 0.44–1.00)
GFR, Estimated: 41 mL/min — ABNORMAL LOW (ref >60)
Glucose, Bld: 87 mg/dL (ref 70–99)
Potassium: 4.5 mmol/L (ref 3.5–5.1)
Sodium: 143 mmol/L (ref 135–145)
Total Bilirubin: 0.8 mg/dL (ref 0.3–1.2)
Total Protein: 7.1 g/dL (ref 6.5–8.1)

## 2021-09-16 LAB — CBC WITH DIFFERENTIAL (CANCER CENTER ONLY)
Abs Immature Granulocytes: 0.06 10*3/uL (ref 0.00–0.07)
Basophils Absolute: 0 10*3/uL (ref 0.0–0.1)
Basophils Relative: 0 %
Eosinophils Absolute: 0 10*3/uL (ref 0.0–0.5)
Eosinophils Relative: 1 %
HCT: 31.9 % — ABNORMAL LOW (ref 36.0–46.0)
Hemoglobin: 10.2 g/dL — ABNORMAL LOW (ref 12.0–15.0)
Immature Granulocytes: 1 %
Lymphocytes Relative: 47 %
Lymphs Abs: 2.8 10*3/uL (ref 0.7–4.0)
MCH: 36.4 pg — ABNORMAL HIGH (ref 26.0–34.0)
MCHC: 32 g/dL (ref 30.0–36.0)
MCV: 113.9 fL — ABNORMAL HIGH (ref 80.0–100.0)
Monocytes Absolute: 0.7 10*3/uL (ref 0.1–1.0)
Monocytes Relative: 11 %
Neutro Abs: 2.3 10*3/uL (ref 1.7–7.7)
Neutrophils Relative %: 40 %
Platelet Count: 127 10*3/uL — ABNORMAL LOW (ref 150–400)
RBC: 2.8 MIL/uL — ABNORMAL LOW (ref 3.87–5.11)
RDW: 15.3 % (ref 11.5–15.5)
WBC Count: 5.9 10*3/uL (ref 4.0–10.5)
nRBC: 0.3 % — ABNORMAL HIGH (ref 0.0–0.2)

## 2021-09-16 LAB — RETICULOCYTES
Immature Retic Fract: 32 % — ABNORMAL HIGH (ref 2.3–15.9)
RBC.: 2.84 MIL/uL — ABNORMAL LOW (ref 3.87–5.11)
Retic Count, Absolute: 65.3 10*3/uL (ref 19.0–186.0)
Retic Ct Pct: 2.3 % (ref 0.4–3.1)

## 2021-09-16 MED ORDER — EPOETIN ALFA-EPBX 40000 UNIT/ML IJ SOLN
40000.0000 [IU] | Freq: Once | INTRAMUSCULAR | Status: AC
  Administered 2021-09-16: 40000 [IU] via SUBCUTANEOUS
  Filled 2021-09-16: qty 1

## 2021-09-16 NOTE — Progress Notes (Signed)
?Hematology and Oncology Follow Up Visit ? ?Helen Hardy ?[id number] ?1951-01-06 71 y.o. ?09/16/2021 ? ? 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  ?Current Therapy:        ?IV Iron as indicated ?Retacrit 40,000 units SQ for Hgb < 11 ?Folic acid 1 mg PO daily ?  ?Interim History:  Helen Hardy is here today for follow-up and injection. She is doing well in PT during the week but states that it will be a while before she is ready to have surgery to correct the effects of RA to her right ankle and foot.  ?No fever, chills, n/v, cough, rash, dizziness, SOB, chest pain, palpitations, abdominal pain or changes in bowel or bladder habits.  ?No obvious blood loss noted. No bruising, no petechiae.  ?No numbness or tingling in her extremities.  ?No falls or syncope reported.  ?She has maintained a good appetite and is staying well hydrated throughout the day.  ? ?ECOG Performance Status: 2 - Symptomatic, <50% confined to bed ? ?Medications:  ?Allergies as of 09/16/2021   ? ?   Reactions  ? Oxycodone Nausea And Vomiting  ? Able tolerate with antinausea medicine, Pt is currently taking  ? Tramadol Other (See Comments)  ? UNKNOWN REACTION  ? ?  ? ?  ?Medication List  ?  ? ?  ? Accurate as of September 16, 2021  2:27 PM. If you have any questions, ask your nurse or doctor.  ?  ?  ? ?  ? ?acetaminophen 500 MG tablet ?Commonly known as: TYLENOL ?Take 2 tablets (1,000 mg total) by mouth every 8 (eight) hours. ?  ?cholecalciferol 25 MCG (1000 UNIT) tablet ?Commonly known as: VITAMIN D ?Take 1,000 Units by mouth in the morning. ?  ?docusate sodium 100 MG capsule ?Commonly known as: COLACE ?Take 1 capsule (100 mg total) by mouth 2 (two) times daily. ?What changed: when to take this ?  ?estradiol 1 MG tablet ?Commonly known as: ESTRACE ?Take 1 mg by mouth in the morning. ?  ?folic acid 1 MG tablet ?Commonly known as:  FOLVITE ?Take 2 mg by mouth in the morning. ?  ?furosemide 40 MG tablet ?Commonly known as: LASIX ?Take 40 mg by mouth in the morning. ?  ?gabapentin 300 MG capsule ?Commonly known as: NEURONTIN ?Take 300-600 mg by mouth See admin instructions. Take 1 capsule (300 mg) by mouth in the morning, take 1 capsule (300 mg) by mouth in the afternoon & take 2 capsules (600 mg) by mouth at night. ?  ?leflunomide 20 MG tablet ?Commonly known as: ARAVA ?Take 1 tablet (20 mg total) by mouth in the morning. Hold for two weeks after surgery ?  ?Metamucil 0.52 g capsule ?Generic drug: psyllium ?Take 1 capsule (0.52 g total) by mouth daily. ?  ?methocarbamol 500 MG tablet ?Commonly known as: ROBAXIN ?Take 1 tablet (500 mg total) by mouth every 6 (six) hours as needed for muscle spasms. ?  ?multivitamin with minerals Tabs tablet ?Take 1 tablet by mouth in the morning. ?  ?olmesartan 20 MG tablet ?Commonly known as: BENICAR ?Take 20 mg by mouth in the morning. ?  ?ondansetron 4 MG disintegrating tablet ?Commonly known as: ZOFRAN-ODT ?23m ODT q4 hours prn nausea/vomit ?  ?ondansetron 4 MG tablet ?Commonly known as: ZOFRAN ?Take 4 mg by mouth every 8 (eight) hours as needed for nausea or vomiting. ?  ?oxyCODONE 5 MG immediate  release tablet ?Commonly known as: Oxy IR/ROXICODONE ?Take 2-3 tablets (10-15 mg total) by mouth every 4 (four) hours as needed for severe pain (pain score 4-6). ?  ?pantoprazole 40 MG tablet ?Commonly known as: PROTONIX ?Take 40 mg by mouth in the morning. ?  ?polyethylene glycol 17 g packet ?Commonly known as: MIRALAX / GLYCOLAX ?Take 17 g by mouth daily as needed for mild constipation. ?  ?Potassium Chloride ER 20 MEQ Tbcr ?Take 20 mEq by mouth in the morning. ?  ?predniSONE 10 MG tablet ?Commonly known as: DELTASONE ?Take 10 mg by mouth daily with breakfast. ?  ?vitamin B-12 1000 MCG tablet ?Commonly known as: CYANOCOBALAMIN ?Take 1,000 mcg by mouth in the morning. ?  ?vitamin C 1000 MG tablet ?Take 1,000 mg by  mouth in the morning. ?  ? ?  ? ? ?Allergies:  ?Allergies  ?Allergen Reactions  ? Oxycodone Nausea And Vomiting  ?  Able tolerate with antinausea medicine, Pt is currently taking  ? Tramadol Other (See Comments)  ?  UNKNOWN REACTION  ? ? ?Past Medical History, Surgical history, Social history, and Family History were reviewed and updated. ? ?Review of Systems: ?All other 10 point review of systems is negative.  ? ?Physical Exam: ? height is 4' 11"  (1.499 m). Her oral temperature is 98 ?F (36.7 ?C). Her blood pressure is 147/80 (abnormal) and her pulse is 85. Her respiration is 17 and oxygen saturation is 99%.  ? ?Wt Readings from Last 3 Encounters:  ?04/14/21 216 lb (98 kg)  ?03/11/21 206 lb (93.4 kg)  ?02/21/21 206 lb (93.4 kg)  ? ? ?Ocular: Sclerae unicteric, pupils equal, round and reactive to light ?Ear-nose-throat: Oropharynx clear, dentition fair ?Lymphatic: No cervical or supraclavicular adenopathy ?Lungs no rales or rhonchi, good excursion bilaterally ?Heart regular rate and rhythm, no murmur appreciated ?Abd soft, nontender, positive bowel sounds ?MSK no focal spinal tenderness, no joint edema ?Neuro: non-focal, well-oriented, appropriate affect ?Breasts: Deferred  ? ?Lab Results  ?Component Value Date  ? WBC 5.9 09/16/2021  ? HGB 10.2 (L) 09/16/2021  ? HCT 31.9 (L) 09/16/2021  ? MCV 113.9 (H) 09/16/2021  ? PLT 127 (L) 09/16/2021  ? ?Lab Results  ?Component Value Date  ? FERRITIN 1,491 (H) 08/19/2021  ? IRON 100 08/19/2021  ? TIBC 281 08/19/2021  ? UIBC 181 08/19/2021  ? IRONPCTSAT 36 (H) 08/19/2021  ? ?Lab Results  ?Component Value Date  ? RETICCTPCT 2.3 09/16/2021  ? RBC 2.84 (L) 09/16/2021  ? RETICCTABS 41.0 06/21/2015  ? ?No results found for: KPAFRELGTCHN, LAMBDASER, KAPLAMBRATIO ?No results found for: IGGSERUM, IGA, IGMSERUM ?No results found for: TOTALPROTELP, ALBUMINELP, A1GS, A2GS, BETS, BETA2SER, GAMS, MSPIKE, SPEI ?  Chemistry   ?   ?Component Value Date/Time  ? NA 139 08/19/2021 1405  ? NA 144  04/29/2017 0929  ? K 4.4 08/19/2021 1405  ? K 4.0 04/29/2017 0929  ? CL 106 08/19/2021 1405  ? CL 103 06/21/2015 0800  ? CO2 25 08/19/2021 1405  ? CO2 25 04/29/2017 0929  ? BUN 40 (H) 08/19/2021 1405  ? BUN 17.7 04/29/2017 0929  ? CREATININE 1.98 (H) 08/19/2021 1405  ? CREATININE 0.8 04/29/2017 0929  ?    ?Component Value Date/Time  ? CALCIUM 8.7 (L) 08/19/2021 1405  ? CALCIUM 9.3 04/29/2017 0929  ? ALKPHOS 55 08/19/2021 1405  ? ALKPHOS 55 04/29/2017 0929  ? AST 15 08/19/2021 1405  ? AST 19 04/29/2017 0929  ? ALT 11 08/19/2021 1405  ?  ALT 16 04/29/2017 0929  ? BILITOT 0.3 08/19/2021 1405  ? BILITOT 0.43 04/29/2017 0929  ?  ? ? ? ?Impression and Plan: Helen Hardy is a very pleasant 71 yo [place] American female with 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 She has multifactorial anemia.  ?ESA given, Hgb 10.2.  ?Iron studies pending.  ?Lab and injection in 1 month, follow-up in 2 months.  ? ?Lottie Dawson, NP ?3/14/20232:27 PM ? ?

## 2021-09-16 NOTE — Patient Instructions (Signed)

## 2021-09-17 LAB — IRON AND IRON BINDING CAPACITY (CC-WL,HP ONLY)
Iron: 109 ug/dL (ref 28–170)
Saturation Ratios: 40 % — ABNORMAL HIGH (ref 10.4–31.8)
TIBC: 276 ug/dL (ref 250–450)
UIBC: 167 ug/dL (ref 148–442)

## 2021-09-17 LAB — FERRITIN: Ferritin: 1583 ng/mL — ABNORMAL HIGH (ref 11–307)

## 2021-09-18 DIAGNOSIS — M25561 Pain in right knee: Secondary | ICD-10-CM | POA: Diagnosis not present

## 2021-09-18 DIAGNOSIS — I10 Essential (primary) hypertension: Secondary | ICD-10-CM | POA: Diagnosis not present

## 2021-09-22 DIAGNOSIS — M25561 Pain in right knee: Secondary | ICD-10-CM | POA: Diagnosis not present

## 2021-09-24 ENCOUNTER — Other Ambulatory Visit: Payer: Self-pay | Admitting: Internal Medicine

## 2021-09-24 DIAGNOSIS — M8580 Other specified disorders of bone density and structure, unspecified site: Secondary | ICD-10-CM

## 2021-09-26 DIAGNOSIS — M25561 Pain in right knee: Secondary | ICD-10-CM | POA: Diagnosis not present

## 2021-09-29 DIAGNOSIS — M0579 Rheumatoid arthritis with rheumatoid factor of multiple sites without organ or systems involvement: Secondary | ICD-10-CM | POA: Diagnosis not present

## 2021-09-30 DIAGNOSIS — M069 Rheumatoid arthritis, unspecified: Secondary | ICD-10-CM | POA: Diagnosis not present

## 2021-09-30 DIAGNOSIS — K219 Gastro-esophageal reflux disease without esophagitis: Secondary | ICD-10-CM | POA: Diagnosis not present

## 2021-09-30 DIAGNOSIS — R262 Difficulty in walking, not elsewhere classified: Secondary | ICD-10-CM | POA: Diagnosis not present

## 2021-09-30 DIAGNOSIS — I10 Essential (primary) hypertension: Secondary | ICD-10-CM | POA: Diagnosis not present

## 2021-10-02 DIAGNOSIS — H6121 Impacted cerumen, right ear: Secondary | ICD-10-CM | POA: Diagnosis not present

## 2021-10-03 DIAGNOSIS — M25561 Pain in right knee: Secondary | ICD-10-CM | POA: Diagnosis not present

## 2021-10-07 DIAGNOSIS — M25561 Pain in right knee: Secondary | ICD-10-CM | POA: Diagnosis not present

## 2021-10-14 DIAGNOSIS — M25561 Pain in right knee: Secondary | ICD-10-CM | POA: Diagnosis not present

## 2021-10-17 ENCOUNTER — Inpatient Hospital Stay: Payer: Medicare Other

## 2021-10-17 ENCOUNTER — Inpatient Hospital Stay: Payer: Medicare Other | Attending: Hematology & Oncology

## 2021-10-17 VITALS — BP 154/76 | HR 80 | Temp 98.6°F | Resp 16

## 2021-10-17 DIAGNOSIS — I129 Hypertensive chronic kidney disease with stage 1 through stage 4 chronic kidney disease, or unspecified chronic kidney disease: Secondary | ICD-10-CM | POA: Insufficient documentation

## 2021-10-17 DIAGNOSIS — D631 Anemia in chronic kidney disease: Secondary | ICD-10-CM | POA: Insufficient documentation

## 2021-10-17 DIAGNOSIS — D500 Iron deficiency anemia secondary to blood loss (chronic): Secondary | ICD-10-CM

## 2021-10-17 DIAGNOSIS — N1832 Chronic kidney disease, stage 3b: Secondary | ICD-10-CM | POA: Insufficient documentation

## 2021-10-17 DIAGNOSIS — Z79899 Other long term (current) drug therapy: Secondary | ICD-10-CM | POA: Insufficient documentation

## 2021-10-17 DIAGNOSIS — K909 Intestinal malabsorption, unspecified: Secondary | ICD-10-CM

## 2021-10-17 LAB — CMP (CANCER CENTER ONLY)
ALT: 13 U/L (ref 0–44)
AST: 18 U/L (ref 15–41)
Albumin: 4 g/dL (ref 3.5–5.0)
Alkaline Phosphatase: 64 U/L (ref 38–126)
Anion gap: 8 (ref 5–15)
BUN: 25 mg/dL — ABNORMAL HIGH (ref 8–23)
CO2: 27 mmol/L (ref 22–32)
Calcium: 9.4 mg/dL (ref 8.9–10.3)
Chloride: 106 mmol/L (ref 98–111)
Creatinine: 1.21 mg/dL — ABNORMAL HIGH (ref 0.44–1.00)
GFR, Estimated: 48 mL/min — ABNORMAL LOW (ref >60)
Glucose, Bld: 105 mg/dL — ABNORMAL HIGH (ref 70–99)
Potassium: 4.3 mmol/L (ref 3.5–5.1)
Sodium: 141 mmol/L (ref 135–145)
Total Bilirubin: 0.5 mg/dL (ref 0.3–1.2)
Total Protein: 6.4 g/dL — ABNORMAL LOW (ref 6.5–8.1)

## 2021-10-17 LAB — IRON AND IRON BINDING CAPACITY (CC-WL,HP ONLY)
Iron: 70 ug/dL (ref 28–170)
Saturation Ratios: 26 % (ref 10.4–31.8)
TIBC: 273 ug/dL (ref 250–450)
UIBC: 203 ug/dL (ref 148–442)

## 2021-10-17 LAB — CBC WITH DIFFERENTIAL (CANCER CENTER ONLY)
Abs Immature Granulocytes: 0.03 10*3/uL (ref 0.00–0.07)
Basophils Absolute: 0 10*3/uL (ref 0.0–0.1)
Basophils Relative: 1 %
Eosinophils Absolute: 0 10*3/uL (ref 0.0–0.5)
Eosinophils Relative: 1 %
HCT: 33 % — ABNORMAL LOW (ref 36.0–46.0)
Hemoglobin: 10.4 g/dL — ABNORMAL LOW (ref 12.0–15.0)
Immature Granulocytes: 1 %
Lymphocytes Relative: 24 %
Lymphs Abs: 1.5 10*3/uL (ref 0.7–4.0)
MCH: 35.3 pg — ABNORMAL HIGH (ref 26.0–34.0)
MCHC: 31.5 g/dL (ref 30.0–36.0)
MCV: 111.9 fL — ABNORMAL HIGH (ref 80.0–100.0)
Monocytes Absolute: 0.5 10*3/uL (ref 0.1–1.0)
Monocytes Relative: 8 %
Neutro Abs: 4.1 10*3/uL (ref 1.7–7.7)
Neutrophils Relative %: 65 %
Platelet Count: 128 10*3/uL — ABNORMAL LOW (ref 150–400)
RBC: 2.95 MIL/uL — ABNORMAL LOW (ref 3.87–5.11)
RDW: 15.9 % — ABNORMAL HIGH (ref 11.5–15.5)
WBC Count: 6.2 10*3/uL (ref 4.0–10.5)
nRBC: 0 % (ref 0.0–0.2)

## 2021-10-17 LAB — RETICULOCYTES
Immature Retic Fract: 27.7 % — ABNORMAL HIGH (ref 2.3–15.9)
RBC.: 2.94 MIL/uL — ABNORMAL LOW (ref 3.87–5.11)
Retic Count, Absolute: 70.3 10*3/uL (ref 19.0–186.0)
Retic Ct Pct: 2.4 % (ref 0.4–3.1)

## 2021-10-17 MED ORDER — EPOETIN ALFA-EPBX 40000 UNIT/ML IJ SOLN
40000.0000 [IU] | Freq: Once | INTRAMUSCULAR | Status: AC
  Administered 2021-10-17: 40000 [IU] via SUBCUTANEOUS
  Filled 2021-10-17: qty 1

## 2021-10-17 NOTE — Patient Instructions (Signed)
Epoetin Alfa injection ?What is this medication? ?EPOETIN ALFA (e POE e tin AL fa) helps your body make more red blood cells. This medicine is used to treat anemia caused by chronic kidney disease, cancer chemotherapy, or HIV-therapy. It may also be used before surgery if you have anemia. ?This medicine may be used for other purposes; ask your health care provider or pharmacist if you have questions. ?COMMON BRAND NAME(S): Epogen, Procrit, Retacrit ?What should I tell my care team before I take this medication? ?They need to know if you have any of these conditions: ?cancer ?heart disease ?high blood pressure ?history of blood clots ?history of stroke ?low levels of folate, iron, or vitamin B12 in the blood ?seizures ?an unusual or allergic reaction to erythropoietin, albumin, benzyl alcohol, hamster proteins, other medicines, foods, dyes, or preservatives ?pregnant or trying to get pregnant ?breast-feeding ?How should I use this medication? ?This medicine is for injection into a vein or under the skin. It is usually given by a health care professional in a hospital or clinic setting. ?If you get this medicine at home, you will be taught how to prepare and give this medicine. Use exactly as directed. Take your medicine at regular intervals. Do not take your medicine more often than directed. ?It is important that you put your used needles and syringes in a special sharps container. Do not put them in a trash can. If you do not have a sharps container, call your pharmacist or healthcare provider to get one. ?A special MedGuide will be given to you by the pharmacist with each prescription and refill. Be sure to read this information carefully each time. ?Talk to your pediatrician regarding the use of this medicine in children. While this drug may be prescribed for selected conditions, precautions do apply. ?Overdosage: If you think you have taken too much of this medicine contact a poison control center or emergency  room at once. ?NOTE: This medicine is only for you. Do not share this medicine with others. ?What if I miss a dose? ?If you miss a dose, take it as soon as you can. If it is almost time for your next dose, take only that dose. Do not take double or extra doses. ?What may interact with this medication? ?Interactions have not been studied. ?This list may not describe all possible interactions. Give your health care provider a list of all the medicines, herbs, non-prescription drugs, or dietary supplements you use. Also tell them if you smoke, drink alcohol, or use illegal drugs. Some items may interact with your medicine. ?What should I watch for while using this medication? ?Your condition will be monitored carefully while you are receiving this medicine. ?You may need blood work done while you are taking this medicine. ?This medicine may cause a decrease in vitamin B6. You should make sure that you get enough vitamin B6 while you are taking this medicine. Discuss the foods you eat and the vitamins you take with your health care professional. ?What side effects may I notice from receiving this medication? ?Side effects that you should report to your doctor or health care professional as soon as possible: ?allergic reactions like skin rash, itching or hives, swelling of the face, lips, or tongue ?seizures ?signs and symptoms of a blood clot such as breathing problems; changes in vision; chest pain; severe, sudden headache; pain, swelling, warmth in the leg; trouble speaking; sudden numbness or weakness of the face, arm or leg ?signs and symptoms of a stroke like   changes in vision; confusion; trouble speaking or understanding; severe headaches; sudden numbness or weakness of the face, arm or leg; trouble walking; dizziness; loss of balance or coordination ?Side effects that usually do not require medical attention (report to your doctor or health care professional if they continue or are  bothersome): ?chills ?cough ?dizziness ?fever ?headaches ?joint pain ?muscle cramps ?muscle pain ?nausea, vomiting ?pain, redness, or irritation at site where injected ?This list may not describe all possible side effects. Call your doctor for medical advice about side effects. You may report side effects to FDA at 1-800-FDA-1088. ?Where should I keep my medication? ?Keep out of the reach of children. ?Store in a refrigerator between 2 and 8 degrees C (36 and 46 degrees F). Do not freeze or shake. Throw away any unused portion if using a single-dose vial. Multi-dose vials can be kept in the refrigerator for up to 21 days after the initial dose. Throw away unused medicine. ?NOTE: This sheet is a summary. It may not cover all possible information. If you have questions about this medicine, talk to your doctor, pharmacist, or health care provider. ?? 2023 [employer]/Gold Standard (2017-02-23 00:00:00) ? ?

## 2021-10-20 DIAGNOSIS — M25561 Pain in right knee: Secondary | ICD-10-CM | POA: Diagnosis not present

## 2021-10-20 LAB — FERRITIN: Ferritin: 1532 ng/mL — ABNORMAL HIGH (ref 11–307)

## 2021-10-21 DIAGNOSIS — M19071 Primary osteoarthritis, right ankle and foot: Secondary | ICD-10-CM | POA: Diagnosis not present

## 2021-10-21 DIAGNOSIS — M069 Rheumatoid arthritis, unspecified: Secondary | ICD-10-CM | POA: Diagnosis not present

## 2021-10-22 ENCOUNTER — Other Ambulatory Visit: Payer: Self-pay | Admitting: Orthopedic Surgery

## 2021-10-22 DIAGNOSIS — M19071 Primary osteoarthritis, right ankle and foot: Secondary | ICD-10-CM

## 2021-10-23 DIAGNOSIS — M25561 Pain in right knee: Secondary | ICD-10-CM | POA: Diagnosis not present

## 2021-10-27 ENCOUNTER — Other Ambulatory Visit: Payer: Self-pay | Admitting: Orthopedic Surgery

## 2021-10-27 DIAGNOSIS — M19071 Primary osteoarthritis, right ankle and foot: Secondary | ICD-10-CM

## 2021-10-27 DIAGNOSIS — M0579 Rheumatoid arthritis with rheumatoid factor of multiple sites without organ or systems involvement: Secondary | ICD-10-CM | POA: Diagnosis not present

## 2021-10-29 DIAGNOSIS — I10 Essential (primary) hypertension: Secondary | ICD-10-CM | POA: Diagnosis not present

## 2021-10-29 DIAGNOSIS — M069 Rheumatoid arthritis, unspecified: Secondary | ICD-10-CM | POA: Diagnosis not present

## 2021-10-29 DIAGNOSIS — R262 Difficulty in walking, not elsewhere classified: Secondary | ICD-10-CM | POA: Diagnosis not present

## 2021-10-29 DIAGNOSIS — K219 Gastro-esophageal reflux disease without esophagitis: Secondary | ICD-10-CM | POA: Diagnosis not present

## 2021-11-14 ENCOUNTER — Ambulatory Visit
Admission: RE | Admit: 2021-11-14 | Discharge: 2021-11-14 | Disposition: A | Discharge status: Home / Self Care | Payer: Medicare Other | Source: Ambulatory Visit | Attending: Orthopedic Surgery | Admitting: Orthopedic Surgery

## 2021-11-14 DIAGNOSIS — S9301XA Subluxation of right ankle joint, initial encounter: Secondary | ICD-10-CM | POA: Diagnosis not present

## 2021-11-14 DIAGNOSIS — Z01818 Encounter for other preprocedural examination: Secondary | ICD-10-CM | POA: Diagnosis not present

## 2021-11-14 DIAGNOSIS — M19071 Primary osteoarthritis, right ankle and foot: Secondary | ICD-10-CM

## 2021-11-17 ENCOUNTER — Inpatient Hospital Stay (HOSPITAL_BASED_OUTPATIENT_CLINIC_OR_DEPARTMENT_OTHER): Payer: Medicare Other | Admitting: Family

## 2021-11-17 ENCOUNTER — Encounter: Payer: Self-pay | Admitting: Family

## 2021-11-17 ENCOUNTER — Inpatient Hospital Stay: Payer: Medicare Other

## 2021-11-17 ENCOUNTER — Inpatient Hospital Stay: Payer: Medicare Other | Attending: Hematology & Oncology

## 2021-11-17 VITALS — BP 107/86 | HR 73 | Temp 98.2°F | Resp 20

## 2021-11-17 DIAGNOSIS — Z8572 Personal history of non-Hodgkin lymphomas: Secondary | ICD-10-CM | POA: Insufficient documentation

## 2021-11-17 DIAGNOSIS — D631 Anemia in chronic kidney disease: Secondary | ICD-10-CM | POA: Diagnosis not present

## 2021-11-17 DIAGNOSIS — D500 Iron deficiency anemia secondary to blood loss (chronic): Secondary | ICD-10-CM | POA: Diagnosis not present

## 2021-11-17 DIAGNOSIS — Z993 Dependence on wheelchair: Secondary | ICD-10-CM | POA: Insufficient documentation

## 2021-11-17 DIAGNOSIS — N1831 Chronic kidney disease, stage 3a: Secondary | ICD-10-CM | POA: Diagnosis not present

## 2021-11-17 DIAGNOSIS — N183 Chronic kidney disease, stage 3 unspecified: Secondary | ICD-10-CM

## 2021-11-17 DIAGNOSIS — E611 Iron deficiency: Secondary | ICD-10-CM | POA: Insufficient documentation

## 2021-11-17 DIAGNOSIS — D509 Iron deficiency anemia, unspecified: Secondary | ICD-10-CM | POA: Diagnosis not present

## 2021-11-17 DIAGNOSIS — K909 Intestinal malabsorption, unspecified: Secondary | ICD-10-CM

## 2021-11-17 LAB — CMP (CANCER CENTER ONLY)
ALT: 12 U/L (ref 0–44)
AST: 18 U/L (ref 15–41)
Albumin: 4 g/dL (ref 3.5–5.0)
Alkaline Phosphatase: 56 U/L (ref 38–126)
Anion gap: 8 (ref 5–15)
BUN: 21 mg/dL (ref 8–23)
CO2: 25 mmol/L (ref 22–32)
Calcium: 9.2 mg/dL (ref 8.9–10.3)
Chloride: 108 mmol/L (ref 98–111)
Creatinine: 1.15 mg/dL — ABNORMAL HIGH (ref 0.44–1.00)
GFR, Estimated: 51 mL/min — ABNORMAL LOW (ref >60)
Glucose, Bld: 118 mg/dL — ABNORMAL HIGH (ref 70–99)
Potassium: 4.2 mmol/L (ref 3.5–5.1)
Sodium: 141 mmol/L (ref 135–145)
Total Bilirubin: 0.5 mg/dL (ref 0.3–1.2)
Total Protein: 6.3 g/dL — ABNORMAL LOW (ref 6.5–8.1)

## 2021-11-17 LAB — CBC WITH DIFFERENTIAL (CANCER CENTER ONLY)
Abs Immature Granulocytes: 0.07 10*3/uL (ref 0.00–0.07)
Basophils Absolute: 0 10*3/uL (ref 0.0–0.1)
Basophils Relative: 0 %
Eosinophils Absolute: 0 10*3/uL (ref 0.0–0.5)
Eosinophils Relative: 0 %
HCT: 31.6 % — ABNORMAL LOW (ref 36.0–46.0)
Hemoglobin: 10.2 g/dL — ABNORMAL LOW (ref 12.0–15.0)
Immature Granulocytes: 1 %
Lymphocytes Relative: 28 %
Lymphs Abs: 1.6 10*3/uL (ref 0.7–4.0)
MCH: 35.4 pg — ABNORMAL HIGH (ref 26.0–34.0)
MCHC: 32.3 g/dL (ref 30.0–36.0)
MCV: 109.7 fL — ABNORMAL HIGH (ref 80.0–100.0)
Monocytes Absolute: 0.5 10*3/uL (ref 0.1–1.0)
Monocytes Relative: 9 %
Neutro Abs: 3.4 10*3/uL (ref 1.7–7.7)
Neutrophils Relative %: 62 %
Platelet Count: 121 10*3/uL — ABNORMAL LOW (ref 150–400)
RBC: 2.88 MIL/uL — ABNORMAL LOW (ref 3.87–5.11)
RDW: 15.9 % — ABNORMAL HIGH (ref 11.5–15.5)
WBC Count: 5.5 10*3/uL (ref 4.0–10.5)
nRBC: 0 % (ref 0.0–0.2)

## 2021-11-17 LAB — IRON AND IRON BINDING CAPACITY (CC-WL,HP ONLY)
Iron: 102 ug/dL (ref 28–170)
Saturation Ratios: 39 % — ABNORMAL HIGH (ref 10.4–31.8)
TIBC: 262 ug/dL (ref 250–450)
UIBC: 160 ug/dL (ref 148–442)

## 2021-11-17 LAB — FERRITIN: Ferritin: 1161 ng/mL — ABNORMAL HIGH (ref 11–307)

## 2021-11-17 LAB — RETICULOCYTES
Immature Retic Fract: 29 % — ABNORMAL HIGH (ref 2.3–15.9)
RBC.: 2.9 MIL/uL — ABNORMAL LOW (ref 3.87–5.11)
Retic Count, Absolute: 66.7 10*3/uL (ref 19.0–186.0)
Retic Ct Pct: 2.3 % (ref 0.4–3.1)

## 2021-11-17 MED ORDER — EPOETIN ALFA-EPBX 40000 UNIT/ML IJ SOLN
40000.0000 [IU] | Freq: Once | INTRAMUSCULAR | Status: AC
  Administered 2021-11-17: 40000 [IU] via SUBCUTANEOUS
  Filled 2021-11-17: qty 1

## 2021-11-17 NOTE — Patient Instructions (Signed)
Epoetin Alfa injection ?What is this medication? ?EPOETIN ALFA (e POE e tin AL fa) helps your body make more red blood cells. This medicine is used to treat anemia caused by chronic kidney disease, cancer chemotherapy, or HIV-therapy. It may also be used before surgery if you have anemia. ?This medicine may be used for other purposes; ask your health care provider or pharmacist if you have questions. ?COMMON BRAND NAME(S): Epogen, Procrit, Retacrit ?What should I tell my care team before I take this medication? ?They need to know if you have any of these conditions: ?cancer ?heart disease ?high blood pressure ?history of blood clots ?history of stroke ?low levels of folate, iron, or vitamin B12 in the blood ?seizures ?an unusual or allergic reaction to erythropoietin, albumin, benzyl alcohol, hamster proteins, other medicines, foods, dyes, or preservatives ?pregnant or trying to get pregnant ?breast-feeding ?How should I use this medication? ?This medicine is for injection into a vein or under the skin. It is usually given by a health care professional in a hospital or clinic setting. ?If you get this medicine at home, you will be taught how to prepare and give this medicine. Use exactly as directed. Take your medicine at regular intervals. Do not take your medicine more often than directed. ?It is important that you put your used needles and syringes in a special sharps container. Do not put them in a trash can. If you do not have a sharps container, call your pharmacist or healthcare provider to get one. ?A special MedGuide will be given to you by the pharmacist with each prescription and refill. Be sure to read this information carefully each time. ?Talk to your pediatrician regarding the use of this medicine in children. While this drug may be prescribed for selected conditions, precautions do apply. ?Overdosage: If you think you have taken too much of this medicine contact a poison control center or emergency  room at once. ?NOTE: This medicine is only for you. Do not share this medicine with others. ?What if I miss a dose? ?If you miss a dose, take it as soon as you can. If it is almost time for your next dose, take only that dose. Do not take double or extra doses. ?What may interact with this medication? ?Interactions have not been studied. ?This list may not describe all possible interactions. Give your health care provider a list of all the medicines, herbs, non-prescription drugs, or dietary supplements you use. Also tell them if you smoke, drink alcohol, or use illegal drugs. Some items may interact with your medicine. ?What should I watch for while using this medication? ?Your condition will be monitored carefully while you are receiving this medicine. ?You may need blood work done while you are taking this medicine. ?This medicine may cause a decrease in vitamin B6. You should make sure that you get enough vitamin B6 while you are taking this medicine. Discuss the foods you eat and the vitamins you take with your health care professional. ?What side effects may I notice from receiving this medication? ?Side effects that you should report to your doctor or health care professional as soon as possible: ?allergic reactions like skin rash, itching or hives, swelling of the face, lips, or tongue ?seizures ?signs and symptoms of a blood clot such as breathing problems; changes in vision; chest pain; severe, sudden headache; pain, swelling, warmth in the leg; trouble speaking; sudden numbness or weakness of the face, arm or leg ?signs and symptoms of a stroke like   changes in vision; confusion; trouble speaking or understanding; severe headaches; sudden numbness or weakness of the face, arm or leg; trouble walking; dizziness; loss of balance or coordination ?Side effects that usually do not require medical attention (report to your doctor or health care professional if they continue or are  bothersome): ?chills ?cough ?dizziness ?fever ?headaches ?joint pain ?muscle cramps ?muscle pain ?nausea, vomiting ?pain, redness, or irritation at site where injected ?This list may not describe all possible side effects. Call your doctor for medical advice about side effects. You may report side effects to FDA at 1-800-FDA-1088. ?Where should I keep my medication? ?Keep out of the reach of children. ?Store in a refrigerator between 2 and 8 degrees C (36 and 46 degrees F). Do not freeze or shake. Throw away any unused portion if using a single-dose vial. Multi-dose vials can be kept in the refrigerator for up to 21 days after the initial dose. Throw away unused medicine. ?NOTE: This sheet is a summary. It may not cover all possible information. If you have questions about this medicine, talk to your doctor, pharmacist, or health care provider. ?? 2023 [employer]/Gold Standard (2017-02-23 00:00:00) ? ?

## 2021-11-17 NOTE — Progress Notes (Addendum)
?Hematology and Oncology Follow Up Visit ? ?Helen Hardy ?[id number] ?1951/01/05 71 y.o. ?11/17/2021 ? ? 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  ?Current Therapy:        ?IV Iron as indicated ?Retacrit 40,000 units SQ for Hgb < 11 ?Folic acid 1 mg PO daily ?  ?Interim History:  Helen Hardy is here today for follow-up. She is doing fairly well but notes fatigue.  ?She will be having a scan of her right foot to determine if she is going to have surgery. She is wheelchair bound at this time.  ?No falls or syncope to report.  ?Swelling in her knees and lower extremities is unchanged from baseline.   ?No fever, chills, n/v, cough, rash, dizziness, SOB, chest pain, palpitations, abdominal pain or changes in bowel or bladder habits.  ?Appetite and hydration are good.  ? ?ECOG Performance Status: 2 - Symptomatic, <50% confined to bed ? ?Medications:  ?Allergies as of 11/17/2021   ? ?   Reactions  ? Oxycodone Nausea And Vomiting  ? Able tolerate with antinausea medicine, Pt is currently taking  ? Tramadol Other (See Comments)  ? UNKNOWN REACTION  ? ?  ? ?  ?Medication List  ?  ? ?  ? Accurate as of Nov 17, 2021  2:13 PM. If you have any questions, ask your nurse or doctor.  ?  ?  ? ?  ? ?acetaminophen 500 MG tablet ?Commonly known as: TYLENOL ?Take 2 tablets (1,000 mg total) by mouth every 8 (eight) hours. ?  ?cholecalciferol 25 MCG (1000 UNIT) tablet ?Commonly known as: VITAMIN D ?Take 1,000 Units by mouth in the morning. ?  ?docusate sodium 100 MG capsule ?Commonly known as: COLACE ?Take 1 capsule (100 mg total) by mouth 2 (two) times daily. ?What changed: when to take this ?  ?estradiol 1 MG tablet ?Commonly known as: ESTRACE ?Take 1 mg by mouth in the morning. ?  ?folic acid 1 MG tablet ?Commonly known as: FOLVITE ?Take 2 mg by mouth in the morning. ?  ?furosemide 40 MG tablet ?Commonly known  as: LASIX ?Take 40 mg by mouth in the morning. ?  ?gabapentin 300 MG capsule ?Commonly known as: NEURONTIN ?Take 300-600 mg by mouth See admin instructions. Take 1 capsule (300 mg) by mouth in the morning, take 1 capsule (300 mg) by mouth in the afternoon & take 2 capsules (600 mg) by mouth at night. ?  ?leflunomide 20 MG tablet ?Commonly known as: ARAVA ?Take 1 tablet (20 mg total) by mouth in the morning. Hold for two weeks after surgery ?  ?Metamucil 0.52 g capsule ?Generic drug: psyllium ?Take 1 capsule (0.52 g total) by mouth daily. ?  ?methocarbamol 500 MG tablet ?Commonly known as: ROBAXIN ?Take 1 tablet (500 mg total) by mouth every 6 (six) hours as needed for muscle spasms. ?  ?multivitamin with minerals Tabs tablet ?Take 1 tablet by mouth in the morning. ?  ?olmesartan 20 MG tablet ?Commonly known as: BENICAR ?Take 20 mg by mouth in the morning. ?  ?ondansetron 4 MG disintegrating tablet ?Commonly known as: ZOFRAN-ODT ?8m ODT q4 hours prn nausea/vomit ?  ?ondansetron 4 MG tablet ?Commonly known as: ZOFRAN ?Take 4 mg by mouth every 8 (eight) hours as needed for nausea or vomiting. ?  ?Orencia 250 MG injection ?Generic drug: abatacept ?every 30 (thirty) days. ?  ?Oxycodone HCl 10 MG Tabs ?Take 10  mg by mouth every 6 (six) hours. ?What changed: Another medication with the same name was removed. Continue taking this medication, and follow the directions you see here. ?Changed by: Lottie Dawson, NP ?  ?pantoprazole 40 MG tablet ?Commonly known as: PROTONIX ?Take 40 mg by mouth in the morning. ?  ?polyethylene glycol 17 g packet ?Commonly known as: MIRALAX / GLYCOLAX ?Take 17 g by mouth daily as needed for mild constipation. ?  ?Potassium Chloride ER 20 MEQ Tbcr ?Take 20 mEq by mouth in the morning. ?  ?predniSONE 10 MG tablet ?Commonly known as: DELTASONE ?Take 10 mg by mouth daily with breakfast. ?  ?tiZANidine 4 MG tablet ?Commonly known as: ZANAFLEX ?tizanidine 4 mg tablet ? Take 1 tablet every day by oral  route at bedtime. ?  ?vitamin B-12 1000 MCG tablet ?Commonly known as: CYANOCOBALAMIN ?Take 1,000 mcg by mouth in the morning. ?  ?vitamin C 1000 MG tablet ?Take 1,000 mg by mouth in the morning. ?  ? ?  ? ? ?Allergies:  ?Allergies  ?Allergen Reactions  ? Oxycodone Nausea And Vomiting  ?  Able tolerate with antinausea medicine, Pt is currently taking  ? Tramadol Other (See Comments)  ?  UNKNOWN REACTION  ? ? ?Past Medical History, Surgical history, Social history, and Family History were reviewed and updated. ? ?Review of Systems: ?All other 10 point review of systems is negative.  ? ?Physical Exam: ? oral temperature is 98.2 ?F (36.8 ?C). Her blood pressure is 107/86 and her pulse is 73. Her respiration is 20 and oxygen saturation is 99%.  ? ?Wt Readings from Last 3 Encounters:  ?04/14/21 216 lb (98 kg)  ?03/11/21 206 lb (93.4 kg)  ?02/21/21 206 lb (93.4 kg)  ? ? ?Ocular: Sclerae unicteric, pupils equal, round and reactive to light ?Ear-nose-throat: Oropharynx clear, dentition fair ?Lymphatic: No cervical or supraclavicular adenopathy ?Lungs no rales or rhonchi, good excursion bilaterally ?Heart regular rate and rhythm, no murmur appreciated ?Abd soft, nontender, positive bowel sounds ?MSK no focal spinal tenderness, no joint edema ?Neuro: non-focal, well-oriented, appropriate affect ?Breasts: Deferred  ? ?Lab Results  ?Component Value Date  ? WBC 5.5 11/17/2021  ? HGB 10.2 (L) 11/17/2021  ? HCT 31.6 (L) 11/17/2021  ? MCV 109.7 (H) 11/17/2021  ? PLT 121 (L) 11/17/2021  ? ?Lab Results  ?Component Value Date  ? FERRITIN 1,532 (H) 10/17/2021  ? IRON 70 10/17/2021  ? TIBC 273 10/17/2021  ? UIBC 203 10/17/2021  ? IRONPCTSAT 26 10/17/2021  ? ?Lab Results  ?Component Value Date  ? RETICCTPCT 2.3 11/17/2021  ? RBC 2.90 (L) 11/17/2021  ? RETICCTABS 41.0 06/21/2015  ? ?No results found for: KPAFRELGTCHN, LAMBDASER, KAPLAMBRATIO ?No results found for: IGGSERUM, IGA, IGMSERUM ?No results found for: TOTALPROTELP, ALBUMINELP,  A1GS, A2GS, BETS, BETA2SER, GAMS, MSPIKE, SPEI ?  Chemistry   ?   ?Component Value Date/Time  ? NA 141 11/17/2021 1322  ? NA 144 04/29/2017 0929  ? K 4.2 11/17/2021 1322  ? K 4.0 04/29/2017 0929  ? CL 108 11/17/2021 1322  ? CL 103 06/21/2015 0800  ? CO2 25 11/17/2021 1322  ? CO2 25 04/29/2017 0929  ? BUN 21 11/17/2021 1322  ? BUN 17.7 04/29/2017 0929  ? CREATININE 1.15 (H) 11/17/2021 1322  ? CREATININE 0.8 04/29/2017 0929  ?    ?Component Value Date/Time  ? CALCIUM 9.2 11/17/2021 1322  ? CALCIUM 9.3 04/29/2017 0929  ? ALKPHOS 56 11/17/2021 1322  ? ALKPHOS 55 04/29/2017 0929  ?  AST 18 11/17/2021 1322  ? AST 19 04/29/2017 0929  ? ALT 12 11/17/2021 1322  ? ALT 16 04/29/2017 0929  ? BILITOT 0.5 11/17/2021 1322  ? BILITOT 0.43 04/29/2017 0929  ?  ? ? ? ?Impression and Plan: Ms. Hugill is a very pleasant 71 yo [place] American female with 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 She has multifactorial anemia.  ?ESA given, Hgb 10.2.  ?Iron studies pending.  ?Lab and injection in 1 month, follow-up in 2 months.  ? ?Lottie Dawson, NP ?5/15/20232:13 PM ? ?

## 2021-11-19 DIAGNOSIS — M069 Rheumatoid arthritis, unspecified: Secondary | ICD-10-CM | POA: Diagnosis not present

## 2021-11-19 DIAGNOSIS — M19071 Primary osteoarthritis, right ankle and foot: Secondary | ICD-10-CM | POA: Diagnosis not present

## 2021-11-24 DIAGNOSIS — M0579 Rheumatoid arthritis with rheumatoid factor of multiple sites without organ or systems involvement: Secondary | ICD-10-CM | POA: Diagnosis not present

## 2021-11-25 DIAGNOSIS — M8589 Other specified disorders of bone density and structure, multiple sites: Secondary | ICD-10-CM | POA: Diagnosis not present

## 2021-11-25 DIAGNOSIS — M8580 Other specified disorders of bone density and structure, unspecified site: Secondary | ICD-10-CM | POA: Diagnosis not present

## 2021-11-29 IMAGING — CT CT CHEST HIGH RESOLUTION W/O CM
2 of 7 series · 11 of 36 positions shown, 13 images · non-contrast
Comparison: Cardiac CT 10/21/2020.

CLINICAL DATA: 69-year-old female with history of COVID infection
history of non-Hodgkin's lymphoma previously treated with
chemotherapy.

EXAM:
CT CHEST WITHOUT CONTRAST
TECHNIQUE: Multidetector CT imaging of the chest was performed following the
standard protocol without intravenous contrast. High resolution
imaging of the lungs, as well as inspiratory and expiratory imaging,
was performed.

[Series 4: chest 2.00 br36 s3 cor soft · coronal · 0.55mm/px · 3 of 166 slices shown]
[im 34/166  lung]
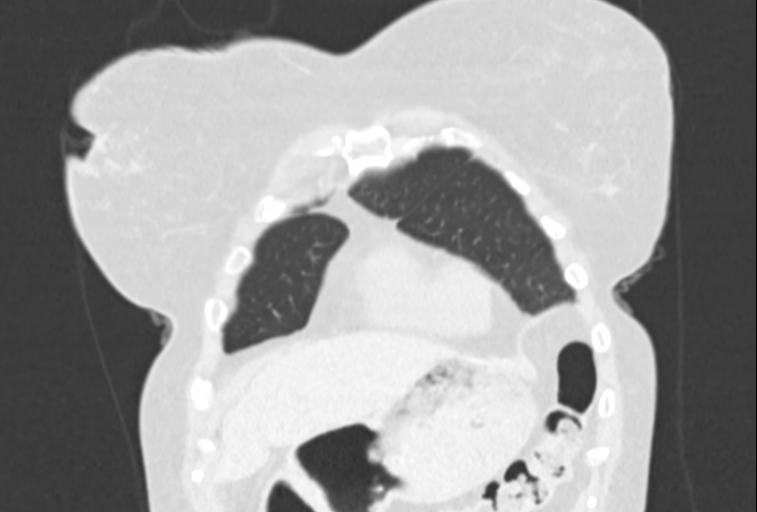
[im 67/166  lung]
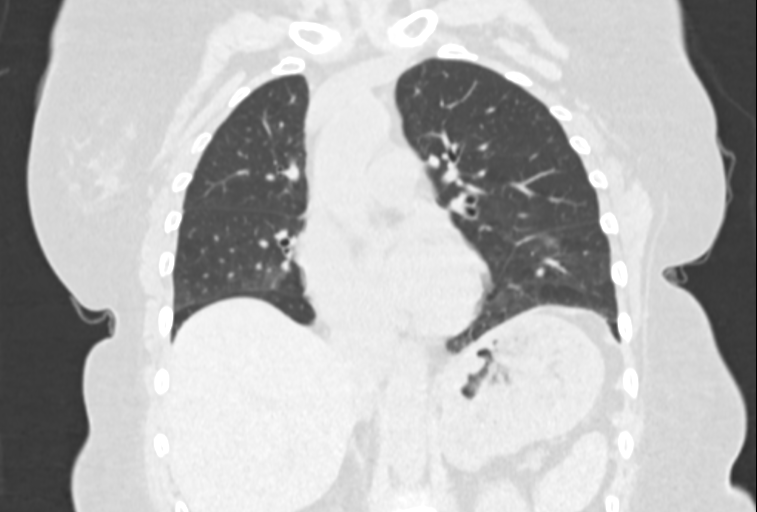
[im 100/166  lung]
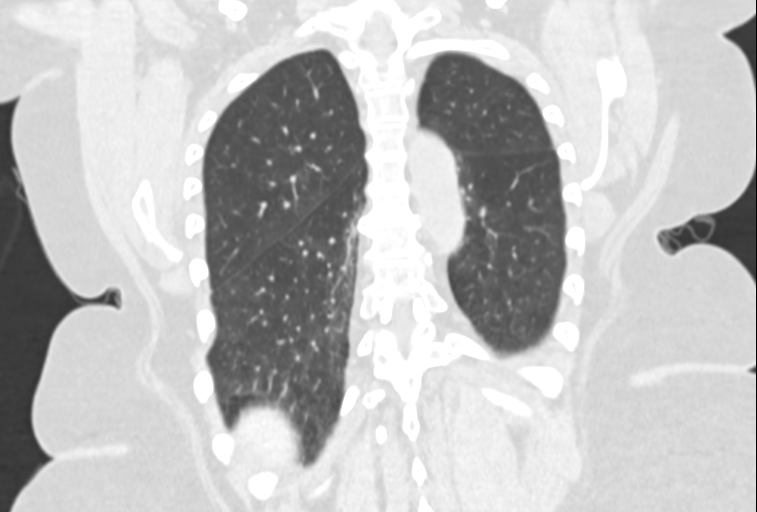

[Series 11: chest 1.00 br60 s3 high res thins 1x1 mm · axial · 0.63mm/px · z∈[+1516,+1726]mm · 8 of 253 slices shown, 10 images]
[im 22/253  mediastinal]
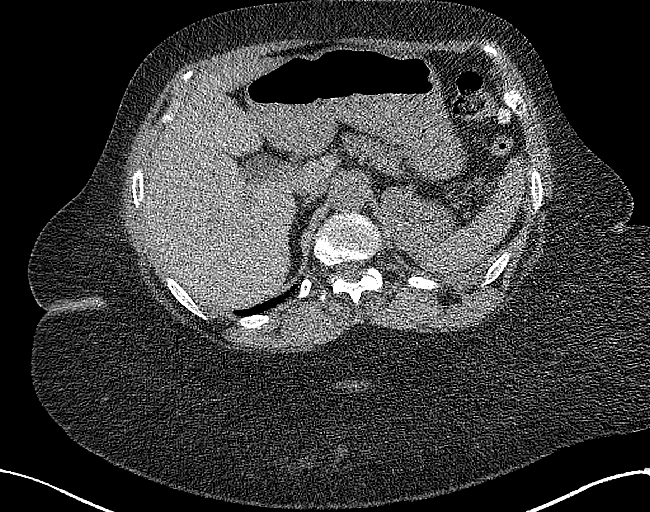
[im 22/253  lung]
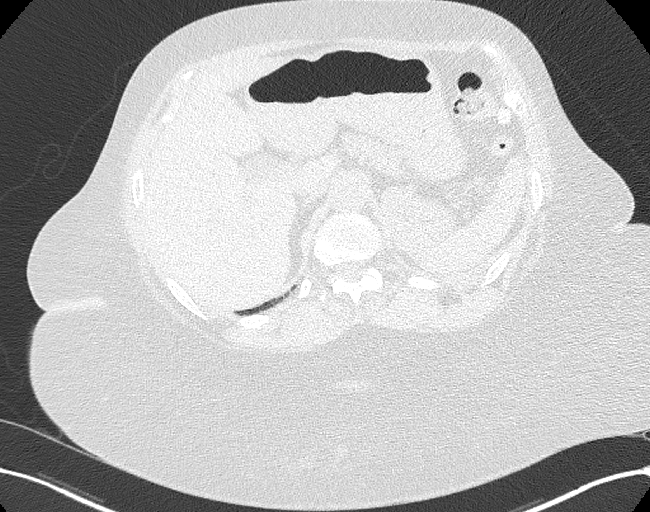
[im 64/253  lung]
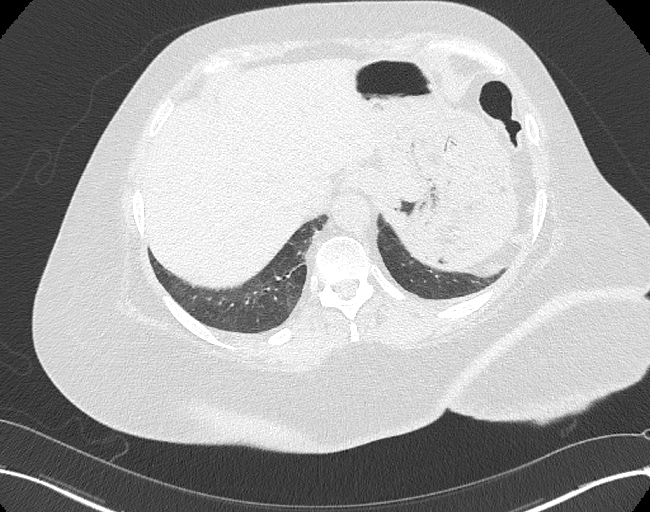
[im 85/253  lung]
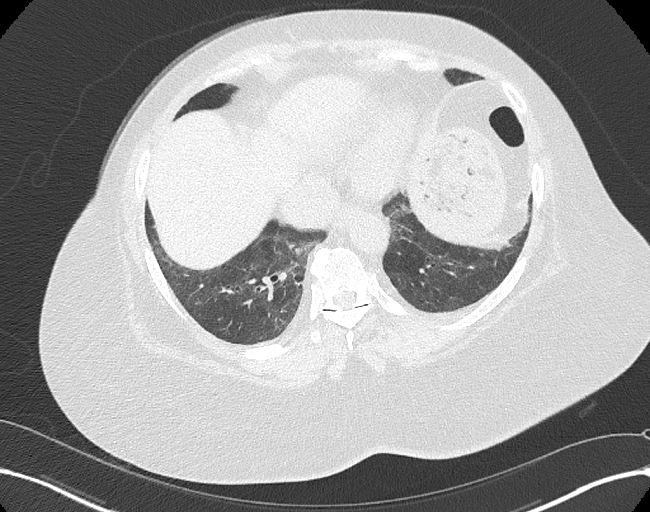
[im 106/253  lung]
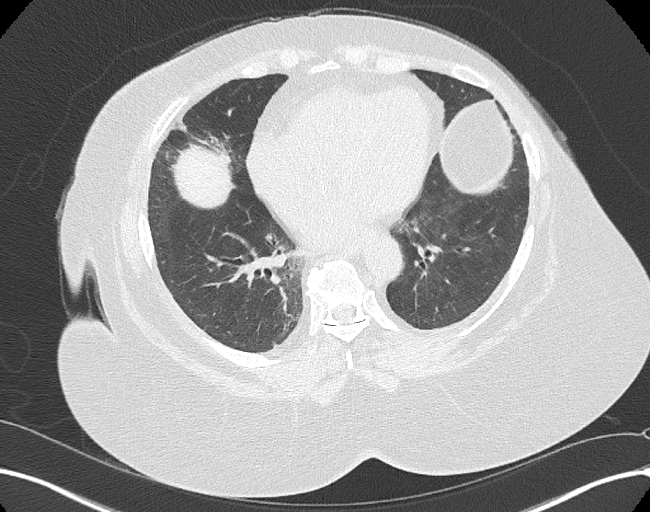
[im 148/253  mediastinal]
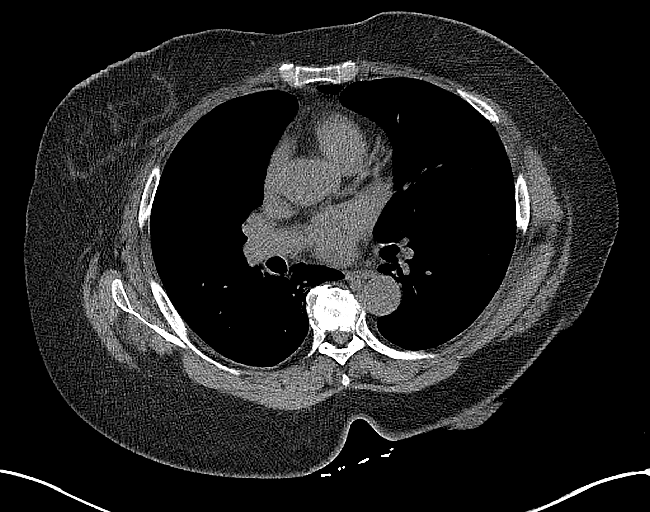
[im 148/253  lung]
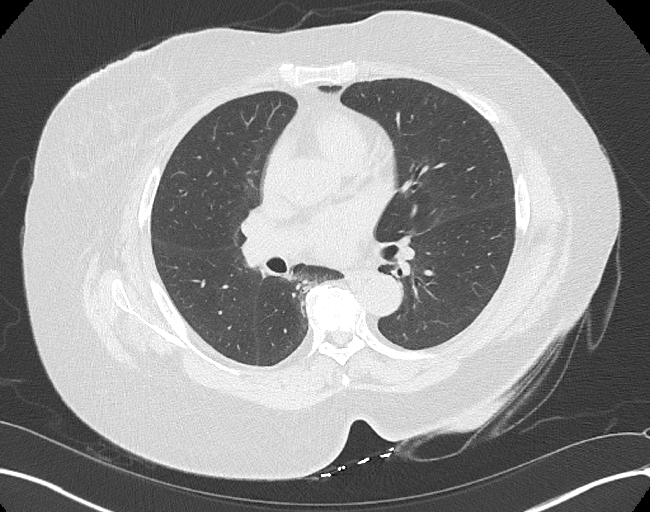
[im 169/253  lung]
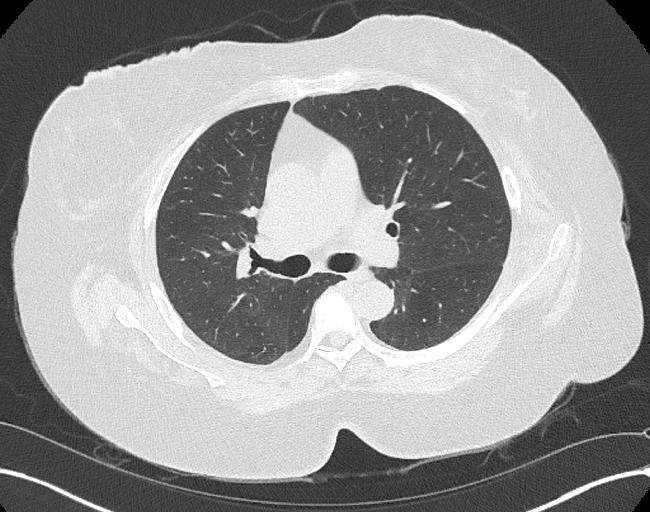
[im 190/253  lung]
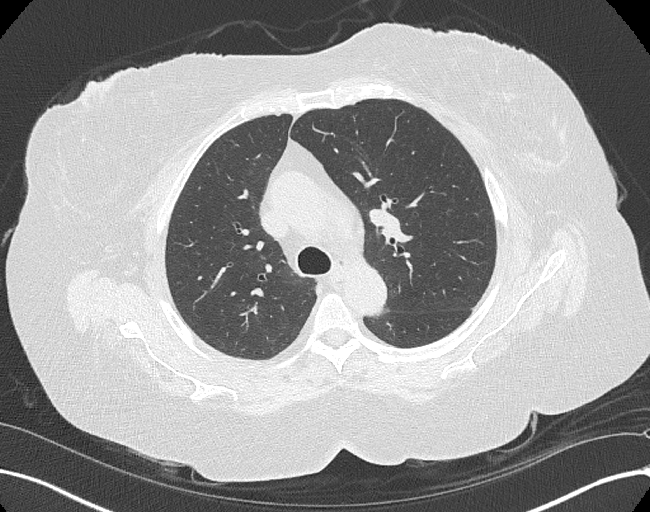
[im 232/253  lung]
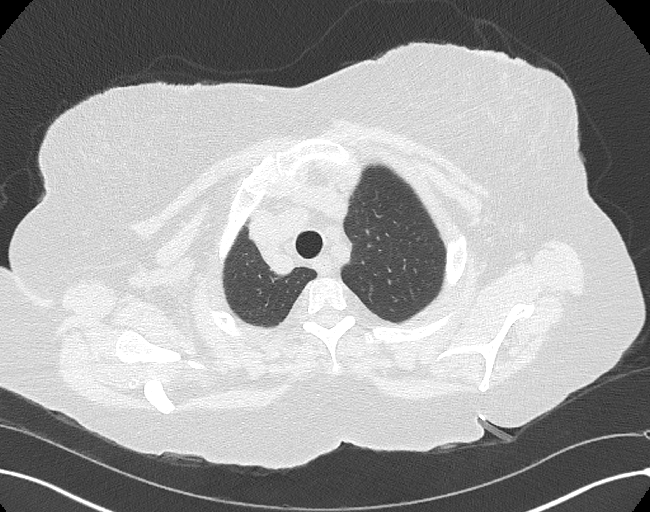

[11 of 36 positions shown; findings below may reference images not displayed]

FINDINGS: Cardiovascular: Heart size is normal. There is no significant
pericardial fluid, thickening or pericardial calcification. Aortic
atherosclerosis. No definite coronary artery calcifications.

Mediastinum/Nodes: No pathologically enlarged mediastinal or hilar
lymph nodes. Please note that accurate exclusion of hilar adenopathy
is limited on noncontrast CT scans. Esophagus is unremarkable in
appearance. No axillary lymphadenopathy.

Lungs/Pleura: High-resolution images demonstrate patchy areas of
peribronchovascular predominant ground-glass attenuation, mild
septal thickening and thickening of the peribronchovascular
interstitium in the lungs bilaterally, most evident throughout the
mid to lower lungs. No traction bronchiectasis or honeycombing.
Inspiratory and expiratory imaging demonstrates some very mild air
trapping indicative of mild small airways disease. No acute
consolidative airspace disease. No pleural effusions. No definite
suspicious appearing pulmonary nodules or masses are noted.

Upper Abdomen: Unremarkable.

Musculoskeletal: There are no aggressive appearing lytic or blastic
lesions noted in the visualized portions of the skeleton.
IMPRESSION: 1. The appearance of the lungs does suggest interstitial lung
disease, with a spectrum of findings considered indeterminate for
usual interstitial pneumonia (UIP) per current ATS guidelines.
Repeat high-resolution chest CT is recommended in 12 months to
assess for temporal changes in the appearance of the lung
parenchyma.
2. Aortic atherosclerosis.

Aortic Atherosclerosis ([id number]).

## 2021-12-03 ENCOUNTER — Other Ambulatory Visit: Payer: Self-pay | Admitting: Obstetrics and Gynecology

## 2021-12-03 DIAGNOSIS — M069 Rheumatoid arthritis, unspecified: Secondary | ICD-10-CM | POA: Diagnosis not present

## 2021-12-03 DIAGNOSIS — I10 Essential (primary) hypertension: Secondary | ICD-10-CM | POA: Diagnosis not present

## 2021-12-03 DIAGNOSIS — R262 Difficulty in walking, not elsewhere classified: Secondary | ICD-10-CM | POA: Diagnosis not present

## 2021-12-03 DIAGNOSIS — Z1231 Encounter for screening mammogram for malignant neoplasm of breast: Secondary | ICD-10-CM

## 2021-12-03 DIAGNOSIS — K219 Gastro-esophageal reflux disease without esophagitis: Secondary | ICD-10-CM | POA: Diagnosis not present

## 2021-12-05 DIAGNOSIS — G894 Chronic pain syndrome: Secondary | ICD-10-CM | POA: Diagnosis not present

## 2021-12-05 DIAGNOSIS — M25561 Pain in right knee: Secondary | ICD-10-CM | POA: Diagnosis not present

## 2021-12-10 ENCOUNTER — Ambulatory Visit
Admission: RE | Admit: 2021-12-10 | Discharge: 2021-12-10 | Disposition: A | Discharge status: Home / Self Care | Payer: Medicare Other | Source: Ambulatory Visit | Attending: Obstetrics and Gynecology | Admitting: Obstetrics and Gynecology

## 2021-12-10 DIAGNOSIS — B3731 Acute candidiasis of vulva and vagina: Secondary | ICD-10-CM | POA: Diagnosis not present

## 2021-12-10 DIAGNOSIS — Z01419 Encounter for gynecological examination (general) (routine) without abnormal findings: Secondary | ICD-10-CM | POA: Diagnosis not present

## 2021-12-10 DIAGNOSIS — Z1231 Encounter for screening mammogram for malignant neoplasm of breast: Secondary | ICD-10-CM

## 2021-12-12 ENCOUNTER — Other Ambulatory Visit: Payer: Self-pay | Admitting: Obstetrics and Gynecology

## 2021-12-12 DIAGNOSIS — M5417 Radiculopathy, lumbosacral region: Secondary | ICD-10-CM | POA: Diagnosis not present

## 2021-12-12 DIAGNOSIS — M5116 Intervertebral disc disorders with radiculopathy, lumbar region: Secondary | ICD-10-CM | POA: Diagnosis not present

## 2021-12-12 DIAGNOSIS — R928 Other abnormal and inconclusive findings on diagnostic imaging of breast: Secondary | ICD-10-CM

## 2021-12-18 ENCOUNTER — Inpatient Hospital Stay: Payer: Medicare Other | Attending: Hematology & Oncology

## 2021-12-18 ENCOUNTER — Inpatient Hospital Stay: Payer: Medicare Other

## 2021-12-18 ENCOUNTER — Other Ambulatory Visit: Payer: Self-pay | Admitting: Oncology

## 2021-12-18 VITALS — BP 148/78 | HR 65 | Temp 98.2°F | Resp 20

## 2021-12-18 DIAGNOSIS — K909 Intestinal malabsorption, unspecified: Secondary | ICD-10-CM

## 2021-12-18 DIAGNOSIS — D631 Anemia in chronic kidney disease: Secondary | ICD-10-CM | POA: Diagnosis not present

## 2021-12-18 DIAGNOSIS — D500 Iron deficiency anemia secondary to blood loss (chronic): Secondary | ICD-10-CM

## 2021-12-18 DIAGNOSIS — N1831 Chronic kidney disease, stage 3a: Secondary | ICD-10-CM | POA: Diagnosis not present

## 2021-12-18 DIAGNOSIS — N183 Chronic kidney disease, stage 3 unspecified: Secondary | ICD-10-CM

## 2021-12-18 DIAGNOSIS — N1832 Chronic kidney disease, stage 3b: Secondary | ICD-10-CM | POA: Insufficient documentation

## 2021-12-18 LAB — CBC WITH DIFFERENTIAL (CANCER CENTER ONLY)
Abs Immature Granulocytes: 0.07 10*3/uL (ref 0.00–0.07)
Basophils Absolute: 0 10*3/uL (ref 0.0–0.1)
Basophils Relative: 0 %
Eosinophils Absolute: 0 10*3/uL (ref 0.0–0.5)
Eosinophils Relative: 1 %
HCT: 32.5 % — ABNORMAL LOW (ref 36.0–46.0)
Hemoglobin: 10.7 g/dL — ABNORMAL LOW (ref 12.0–15.0)
Immature Granulocytes: 1 %
Lymphocytes Relative: 28 %
Lymphs Abs: 1.7 10*3/uL (ref 0.7–4.0)
MCH: 35.9 pg — ABNORMAL HIGH (ref 26.0–34.0)
MCHC: 32.9 g/dL (ref 30.0–36.0)
MCV: 109.1 fL — ABNORMAL HIGH (ref 80.0–100.0)
Monocytes Absolute: 0.5 10*3/uL (ref 0.1–1.0)
Monocytes Relative: 8 %
Neutro Abs: 3.8 10*3/uL (ref 1.7–7.7)
Neutrophils Relative %: 62 %
Platelet Count: 132 10*3/uL — ABNORMAL LOW (ref 150–400)
RBC: 2.98 MIL/uL — ABNORMAL LOW (ref 3.87–5.11)
RDW: 16.1 % — ABNORMAL HIGH (ref 11.5–15.5)
WBC Count: 6.1 10*3/uL (ref 4.0–10.5)
nRBC: 0 % (ref 0.0–0.2)

## 2021-12-18 LAB — CMP (CANCER CENTER ONLY)
ALT: 12 U/L (ref 0–44)
AST: 17 U/L (ref 15–41)
Albumin: 4 g/dL (ref 3.5–5.0)
Alkaline Phosphatase: 53 U/L (ref 38–126)
Anion gap: 8 (ref 5–15)
BUN: 25 mg/dL — ABNORMAL HIGH (ref 8–23)
CO2: 27 mmol/L (ref 22–32)
Calcium: 9.9 mg/dL (ref 8.9–10.3)
Chloride: 107 mmol/L (ref 98–111)
Creatinine: 1.29 mg/dL — ABNORMAL HIGH (ref 0.44–1.00)
GFR, Estimated: 44 mL/min — ABNORMAL LOW (ref >60)
Glucose, Bld: 116 mg/dL — ABNORMAL HIGH (ref 70–99)
Potassium: 4.8 mmol/L (ref 3.5–5.1)
Sodium: 142 mmol/L (ref 135–145)
Total Bilirubin: 0.5 mg/dL (ref 0.3–1.2)
Total Protein: 6.7 g/dL (ref 6.5–8.1)

## 2021-12-18 MED ORDER — EPOETIN ALFA-EPBX 40000 UNIT/ML IJ SOLN
40000.0000 [IU] | Freq: Once | INTRAMUSCULAR | Status: AC
  Administered 2021-12-18: 40000 [IU] via SUBCUTANEOUS
  Filled 2021-12-18: qty 1

## 2021-12-18 NOTE — Patient Instructions (Signed)
Epoetin Alfa injection ?What is this medication? ?EPOETIN ALFA (e POE e tin AL fa) helps your body make more red blood cells. This medicine is used to treat anemia caused by chronic kidney disease, cancer chemotherapy, or HIV-therapy. It may also be used before surgery if you have anemia. ?This medicine may be used for other purposes; ask your health care provider or pharmacist if you have questions. ?COMMON BRAND NAME(S): Epogen, Procrit, Retacrit ?What should I tell my care team before I take this medication? ?They need to know if you have any of these conditions: ?cancer ?heart disease ?high blood pressure ?history of blood clots ?history of stroke ?low levels of folate, iron, or vitamin B12 in the blood ?seizures ?an unusual or allergic reaction to erythropoietin, albumin, benzyl alcohol, hamster proteins, other medicines, foods, dyes, or preservatives ?pregnant or trying to get pregnant ?breast-feeding ?How should I use this medication? ?This medicine is for injection into a vein or under the skin. It is usually given by a health care professional in a hospital or clinic setting. ?If you get this medicine at home, you will be taught how to prepare and give this medicine. Use exactly as directed. Take your medicine at regular intervals. Do not take your medicine more often than directed. ?It is important that you put your used needles and syringes in a special sharps container. Do not put them in a trash can. If you do not have a sharps container, call your pharmacist or healthcare provider to get one. ?A special MedGuide will be given to you by the pharmacist with each prescription and refill. Be sure to read this information carefully each time. ?Talk to your pediatrician regarding the use of this medicine in children. While this drug may be prescribed for selected conditions, precautions do apply. ?Overdosage: If you think you have taken too much of this medicine contact a poison control center or emergency  room at once. ?NOTE: This medicine is only for you. Do not share this medicine with others. ?What if I miss a dose? ?If you miss a dose, take it as soon as you can. If it is almost time for your next dose, take only that dose. Do not take double or extra doses. ?What may interact with this medication? ?Interactions have not been studied. ?This list may not describe all possible interactions. Give your health care provider a list of all the medicines, herbs, non-prescription drugs, or dietary supplements you use. Also tell them if you smoke, drink alcohol, or use illegal drugs. Some items may interact with your medicine. ?What should I watch for while using this medication? ?Your condition will be monitored carefully while you are receiving this medicine. ?You may need blood work done while you are taking this medicine. ?This medicine may cause a decrease in vitamin B6. You should make sure that you get enough vitamin B6 while you are taking this medicine. Discuss the foods you eat and the vitamins you take with your health care professional. ?What side effects may I notice from receiving this medication? ?Side effects that you should report to your doctor or health care professional as soon as possible: ?allergic reactions like skin rash, itching or hives, swelling of the face, lips, or tongue ?seizures ?signs and symptoms of a blood clot such as breathing problems; changes in vision; chest pain; severe, sudden headache; pain, swelling, warmth in the leg; trouble speaking; sudden numbness or weakness of the face, arm or leg ?signs and symptoms of a stroke like   changes in vision; confusion; trouble speaking or understanding; severe headaches; sudden numbness or weakness of the face, arm or leg; trouble walking; dizziness; loss of balance or coordination ?Side effects that usually do not require medical attention (report to your doctor or health care professional if they continue or are  bothersome): ?chills ?cough ?dizziness ?fever ?headaches ?joint pain ?muscle cramps ?muscle pain ?nausea, vomiting ?pain, redness, or irritation at site where injected ?This list may not describe all possible side effects. Call your doctor for medical advice about side effects. You may report side effects to FDA at 1-800-FDA-1088. ?Where should I keep my medication? ?Keep out of the reach of children. ?Store in a refrigerator between 2 and 8 degrees C (36 and 46 degrees F). Do not freeze or shake. Throw away any unused portion if using a single-dose vial. Multi-dose vials can be kept in the refrigerator for up to 21 days after the initial dose. Throw away unused medicine. ?NOTE: This sheet is a summary. It may not cover all possible information. If you have questions about this medicine, talk to your doctor, pharmacist, or health care provider. ?? 2023 [employer]/Gold Standard (2017-02-23 00:00:00) ? ?

## 2021-12-19 ENCOUNTER — Ambulatory Visit
Admission: RE | Admit: 2021-12-19 | Discharge: 2021-12-19 | Disposition: A | Discharge status: Home / Self Care | Payer: Medicare Other | Source: Ambulatory Visit | Attending: Obstetrics and Gynecology | Admitting: Obstetrics and Gynecology

## 2021-12-19 DIAGNOSIS — R928 Other abnormal and inconclusive findings on diagnostic imaging of breast: Secondary | ICD-10-CM | POA: Diagnosis not present

## 2021-12-19 DIAGNOSIS — N6042 Mammary duct ectasia of left breast: Secondary | ICD-10-CM | POA: Diagnosis not present

## 2021-12-22 DIAGNOSIS — M0579 Rheumatoid arthritis with rheumatoid factor of multiple sites without organ or systems involvement: Secondary | ICD-10-CM | POA: Diagnosis not present

## 2021-12-23 DIAGNOSIS — I10 Essential (primary) hypertension: Secondary | ICD-10-CM | POA: Diagnosis not present

## 2021-12-31 DIAGNOSIS — K219 Gastro-esophageal reflux disease without esophagitis: Secondary | ICD-10-CM | POA: Diagnosis not present

## 2021-12-31 DIAGNOSIS — I10 Essential (primary) hypertension: Secondary | ICD-10-CM | POA: Diagnosis not present

## 2021-12-31 DIAGNOSIS — R262 Difficulty in walking, not elsewhere classified: Secondary | ICD-10-CM | POA: Diagnosis not present

## 2021-12-31 DIAGNOSIS — M069 Rheumatoid arthritis, unspecified: Secondary | ICD-10-CM | POA: Diagnosis not present

## 2022-01-01 DIAGNOSIS — M5416 Radiculopathy, lumbar region: Secondary | ICD-10-CM | POA: Diagnosis not present

## 2022-01-01 DIAGNOSIS — M069 Rheumatoid arthritis, unspecified: Secondary | ICD-10-CM | POA: Diagnosis not present

## 2022-01-01 DIAGNOSIS — G894 Chronic pain syndrome: Secondary | ICD-10-CM | POA: Diagnosis not present

## 2022-01-01 DIAGNOSIS — M961 Postlaminectomy syndrome, not elsewhere classified: Secondary | ICD-10-CM | POA: Diagnosis not present

## 2022-01-08 DIAGNOSIS — R0989 Other specified symptoms and signs involving the circulatory and respiratory systems: Secondary | ICD-10-CM | POA: Diagnosis not present

## 2022-01-08 DIAGNOSIS — R030 Elevated blood-pressure reading, without diagnosis of hypertension: Secondary | ICD-10-CM | POA: Diagnosis not present

## 2022-01-16 ENCOUNTER — Inpatient Hospital Stay (HOSPITAL_BASED_OUTPATIENT_CLINIC_OR_DEPARTMENT_OTHER): Payer: Medicare Other | Admitting: Family

## 2022-01-16 ENCOUNTER — Inpatient Hospital Stay: Payer: Medicare Other

## 2022-01-16 ENCOUNTER — Inpatient Hospital Stay: Payer: Medicare Other | Attending: Hematology & Oncology

## 2022-01-16 ENCOUNTER — Encounter: Payer: Self-pay | Admitting: Family

## 2022-01-16 VITALS — BP 119/57 | HR 79 | Temp 98.2°F | Resp 19 | Wt 228.0 lb

## 2022-01-16 DIAGNOSIS — D500 Iron deficiency anemia secondary to blood loss (chronic): Secondary | ICD-10-CM | POA: Diagnosis not present

## 2022-01-16 DIAGNOSIS — K909 Intestinal malabsorption, unspecified: Secondary | ICD-10-CM

## 2022-01-16 DIAGNOSIS — D631 Anemia in chronic kidney disease: Secondary | ICD-10-CM

## 2022-01-16 DIAGNOSIS — N1831 Chronic kidney disease, stage 3a: Secondary | ICD-10-CM | POA: Insufficient documentation

## 2022-01-16 LAB — CMP (CANCER CENTER ONLY)
ALT: 12 U/L (ref 0–44)
AST: 17 U/L (ref 15–41)
Albumin: 4.3 g/dL (ref 3.5–5.0)
Alkaline Phosphatase: 56 U/L (ref 38–126)
Anion gap: 9 (ref 5–15)
BUN: 26 mg/dL — ABNORMAL HIGH (ref 8–23)
CO2: 24 mmol/L (ref 22–32)
Calcium: 10 mg/dL (ref 8.9–10.3)
Chloride: 108 mmol/L (ref 98–111)
Creatinine: 1.3 mg/dL — ABNORMAL HIGH (ref 0.44–1.00)
GFR, Estimated: 44 mL/min — ABNORMAL LOW (ref >60)
Glucose, Bld: 115 mg/dL — ABNORMAL HIGH (ref 70–99)
Potassium: 4.5 mmol/L (ref 3.5–5.1)
Sodium: 141 mmol/L (ref 135–145)
Total Bilirubin: 0.5 mg/dL (ref 0.3–1.2)
Total Protein: 6.6 g/dL (ref 6.5–8.1)

## 2022-01-16 LAB — RETICULOCYTES
Immature Retic Fract: 23.6 % — ABNORMAL HIGH (ref 2.3–15.9)
RBC.: 3.04 MIL/uL — ABNORMAL LOW (ref 3.87–5.11)
Retic Count, Absolute: 65.1 10*3/uL (ref 19.0–186.0)
Retic Ct Pct: 2.1 % (ref 0.4–3.1)

## 2022-01-16 LAB — IRON AND IRON BINDING CAPACITY (CC-WL,HP ONLY)
Iron: 112 ug/dL (ref 28–170)
Saturation Ratios: 44 % — ABNORMAL HIGH (ref 10.4–31.8)
TIBC: 252 ug/dL (ref 250–450)
UIBC: 140 ug/dL — ABNORMAL LOW (ref 148–442)

## 2022-01-16 LAB — CBC WITH DIFFERENTIAL (CANCER CENTER ONLY)
Abs Immature Granulocytes: 0.12 10*3/uL — ABNORMAL HIGH (ref 0.00–0.07)
Basophils Absolute: 0 10*3/uL (ref 0.0–0.1)
Basophils Relative: 0 %
Eosinophils Absolute: 0 10*3/uL (ref 0.0–0.5)
Eosinophils Relative: 0 %
HCT: 33.1 % — ABNORMAL LOW (ref 36.0–46.0)
Hemoglobin: 10.5 g/dL — ABNORMAL LOW (ref 12.0–15.0)
Immature Granulocytes: 2 %
Lymphocytes Relative: 29 %
Lymphs Abs: 1.9 10*3/uL (ref 0.7–4.0)
MCH: 34.7 pg — ABNORMAL HIGH (ref 26.0–34.0)
MCHC: 31.7 g/dL (ref 30.0–36.0)
MCV: 109.2 fL — ABNORMAL HIGH (ref 80.0–100.0)
Monocytes Absolute: 0.6 10*3/uL (ref 0.1–1.0)
Monocytes Relative: 9 %
Neutro Abs: 3.8 10*3/uL (ref 1.7–7.7)
Neutrophils Relative %: 60 %
Platelet Count: 142 10*3/uL — ABNORMAL LOW (ref 150–400)
RBC: 3.03 MIL/uL — ABNORMAL LOW (ref 3.87–5.11)
RDW: 15.6 % — ABNORMAL HIGH (ref 11.5–15.5)
WBC Count: 6.4 10*3/uL (ref 4.0–10.5)
nRBC: 0.3 % — ABNORMAL HIGH (ref 0.0–0.2)

## 2022-01-16 LAB — FERRITIN: Ferritin: 1305 ng/mL — ABNORMAL HIGH (ref 11–307)

## 2022-01-16 MED ORDER — EPOETIN ALFA-EPBX 40000 UNIT/ML IJ SOLN
40000.0000 [IU] | Freq: Once | INTRAMUSCULAR | Status: AC
  Administered 2022-01-16: 40000 [IU] via SUBCUTANEOUS
  Filled 2022-01-16: qty 1

## 2022-01-16 NOTE — Progress Notes (Signed)
Hematology and Oncology Follow Up Visit  Helen Hardy [id number] Sep 16, 1950 71 y.o. 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 Hgb is 10.5.  She is symptomatic with fatigue and states that she sleeps mostly in the evenings.  No blood loss noted. No bruising or petechiae.  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 She ambulates at home with a walker for support. She is in a wheelchair today to go longer distances.  No falls or syncope reported.  Appetite and hydration are good. Weight is 228 lbs.   ECOG Performance Status: 1 - Symptomatic but completely ambulatory  Medications:  Allergies as of 01/16/2022       Reactions   Oxycodone Nausea And Vomiting   Able tolerate with antinausea medicine, Pt is currently taking   Tramadol Other (See Comments)   UNKNOWN REACTION        Medication List        Accurate as of January 16, 2022  1:49 PM. If you have any questions, ask your nurse or doctor.          acetaminophen 500 MG tablet Commonly known as: TYLENOL Take 2 tablets (1,000 mg total) by mouth every 8 (eight) hours.   cholecalciferol 25 MCG (1000 UNIT) tablet Commonly known as: VITAMIN D Take 1,000 Units by mouth in the morning.   docusate sodium 100 MG capsule Commonly known as: COLACE Take 1 capsule (100 mg total) by mouth 2 (two) times daily. What changed: when to take this   estradiol 1 MG tablet Commonly known as: ESTRACE Take 1 mg by mouth in the morning.   folic acid 1 MG tablet Commonly known as: FOLVITE Take 2 mg by mouth in the morning.   furosemide 40 MG  tablet Commonly known as: LASIX Take 40 mg by mouth in the morning.   gabapentin 300 MG capsule Commonly known as: NEURONTIN Take 300-600 mg by mouth See admin instructions. Take 1 capsule (300 mg) by mouth in the morning, take 1 capsule (300 mg) by mouth in the afternoon & take 2 capsules (600 mg) by mouth at night.   leflunomide 20 MG tablet Commonly known as: ARAVA Take 1 tablet (20 mg total) by mouth in the morning. Hold for two weeks after surgery   Metamucil 0.52 g capsule Generic drug: psyllium Take 1 capsule (0.52 g total) by mouth daily.   methocarbamol 500 MG tablet Commonly known as: ROBAXIN Take 1 tablet (500 mg total) by mouth every 6 (six) hours as needed for muscle spasms.   multivitamin with minerals Tabs tablet Take 1 tablet by mouth in the morning.   olmesartan 20 MG tablet Commonly known as: BENICAR Take 20 mg by mouth in the morning.   ondansetron 4 MG disintegrating tablet Commonly known as: ZOFRAN-ODT 79m ODT q4 hours prn nausea/vomit   ondansetron 4 MG tablet Commonly known as: ZOFRAN Take 4 mg by mouth every 8 (eight) hours as needed for nausea or vomiting.   Orencia 250 MG injection Generic drug: abatacept every 30 (thirty) days.   Oxycodone HCl 10 MG Tabs Take  10 mg by mouth every 6 (six) hours.   pantoprazole 40 MG tablet Commonly known as: PROTONIX Take 40 mg by mouth in the morning.   polyethylene glycol 17 g packet Commonly known as: MIRALAX / GLYCOLAX Take 17 g by mouth daily as needed for mild constipation.   Potassium Chloride ER 20 MEQ Tbcr Take 20 mEq by mouth in the morning.   predniSONE 10 MG tablet Commonly known as: DELTASONE Take 10 mg by mouth daily with breakfast.   tiZANidine 4 MG tablet Commonly known as: ZANAFLEX tizanidine 4 mg tablet  Take 1 tablet every day by oral route at bedtime.   vitamin B-12 1000 MCG tablet Commonly known as: CYANOCOBALAMIN Take 1,000 mcg by mouth in the morning.   vitamin C 1000  MG tablet Take 1,000 mg by mouth in the morning.        Allergies:  Allergies  Allergen Reactions   Oxycodone Nausea And Vomiting    Able tolerate with antinausea medicine, Pt is currently taking   Tramadol Other (See Comments)    UNKNOWN REACTION    Past Medical History, Surgical history, Social history, and Family History were reviewed and updated.  Review of Systems: All other 10 point review of systems is negative.   Physical Exam:  vitals were not taken for this visit.   Wt Readings from Last 3 Encounters:  04/14/21 216 lb (98 kg)  03/11/21 206 lb (93.4 kg)  02/21/21 206 lb (93.4 kg)    Ocular: Sclerae unicteric, pupils equal, round and reactive to light Ear-nose-throat: Oropharynx clear, dentition fair Lymphatic: No cervical or supraclavicular adenopathy Lungs no rales or rhonchi, good excursion bilaterally Heart regular rate and rhythm, no murmur appreciated Abd soft, nontender, positive bowel sounds MSK no focal spinal tenderness, no joint edema Neuro: non-focal, well-oriented, appropriate affect Breasts: Deferred   Lab Results  Component Value Date   WBC 6.4 01/16/2022   HGB 10.5 (L) 01/16/2022   HCT 33.1 (L) 01/16/2022   MCV 109.2 (H) 01/16/2022   PLT 142 (L) 01/16/2022   Lab Results  Component Value Date   FERRITIN 1,161 (H) 11/17/2021   IRON 102 11/17/2021   TIBC 262 11/17/2021   UIBC 160 11/17/2021   IRONPCTSAT 39 (H) 11/17/2021   Lab Results  Component Value Date   RETICCTPCT 2.1 01/16/2022   RBC 3.03 (L) 01/16/2022   RBC 3.04 (L) 01/16/2022   RETICCTABS 41.0 06/21/2015   No results found for: "KPAFRELGTCHN", "LAMBDASER", "KAPLAMBRATIO" No results found for: "IGGSERUM", "IGA", "IGMSERUM" No results found for: "TOTALPROTELP", "ALBUMINELP", "A1GS", "A2GS", "BETS", "BETA2SER", "GAMS", "MSPIKE", "SPEI"   Chemistry      Component Value Date/Time   NA 142 12/18/2021 1306   NA 144 04/29/2017 0929   K 4.8 12/18/2021 1306   K 4.0 04/29/2017  0929   CL 107 12/18/2021 1306   CL 103 06/21/2015 0800   CO2 27 12/18/2021 1306   CO2 25 04/29/2017 0929   BUN 25 (H) 12/18/2021 1306   BUN 17.7 04/29/2017 0929   CREATININE 1.29 (H) 12/18/2021 1306   CREATININE 0.8 04/29/2017 0929      Component Value Date/Time   CALCIUM 9.9 12/18/2021 1306   CALCIUM 9.3 04/29/2017 0929   ALKPHOS 53 12/18/2021 1306   ALKPHOS 55 04/29/2017 0929   AST 17 12/18/2021 1306   AST 19 04/29/2017 0929   ALT 12 12/18/2021 1306   ALT 16 04/29/2017 0929   BILITOT 0.5 12/18/2021 1306   BILITOT 0.43 04/29/2017 0929  Impression and Plan: Helen Hardy is a very pleasant 71 yo [place] American female with 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 She has multifactorial anemia.  ESA given, Hgb 10.5.  Monthly lab and injection.  Follow-up in 3 months.   Lottie Dawson, NP 7/14/20231:49 PM

## 2022-01-16 NOTE — Patient Instructions (Signed)
Epoetin Alfa injection ?What is this medication? ?EPOETIN ALFA (e POE e tin AL fa) helps your body make more red blood cells. This medicine is used to treat anemia caused by chronic kidney disease, cancer chemotherapy, or HIV-therapy. It may also be used before surgery if you have anemia. ?This medicine may be used for other purposes; ask your health care provider or pharmacist if you have questions. ?COMMON BRAND NAME(S): Epogen, Procrit, Retacrit ?What should I tell my care team before I take this medication? ?They need to know if you have any of these conditions: ?cancer ?heart disease ?high blood pressure ?history of blood clots ?history of stroke ?low levels of folate, iron, or vitamin B12 in the blood ?seizures ?an unusual or allergic reaction to erythropoietin, albumin, benzyl alcohol, hamster proteins, other medicines, foods, dyes, or preservatives ?pregnant or trying to get pregnant ?breast-feeding ?How should I use this medication? ?This medicine is for injection into a vein or under the skin. It is usually given by a health care professional in a hospital or clinic setting. ?If you get this medicine at home, you will be taught how to prepare and give this medicine. Use exactly as directed. Take your medicine at regular intervals. Do not take your medicine more often than directed. ?It is important that you put your used needles and syringes in a special sharps container. Do not put them in a trash can. If you do not have a sharps container, call your pharmacist or healthcare provider to get one. ?A special MedGuide will be given to you by the pharmacist with each prescription and refill. Be sure to read this information carefully each time. ?Talk to your pediatrician regarding the use of this medicine in children. While this drug may be prescribed for selected conditions, precautions do apply. ?Overdosage: If you think you have taken too much of this medicine contact a poison control center or emergency  room at once. ?NOTE: This medicine is only for you. Do not share this medicine with others. ?What if I miss a dose? ?If you miss a dose, take it as soon as you can. If it is almost time for your next dose, take only that dose. Do not take double or extra doses. ?What may interact with this medication? ?Interactions have not been studied. ?This list may not describe all possible interactions. Give your health care provider a list of all the medicines, herbs, non-prescription drugs, or dietary supplements you use. Also tell them if you smoke, drink alcohol, or use illegal drugs. Some items may interact with your medicine. ?What should I watch for while using this medication? ?Your condition will be monitored carefully while you are receiving this medicine. ?You may need blood work done while you are taking this medicine. ?This medicine may cause a decrease in vitamin B6. You should make sure that you get enough vitamin B6 while you are taking this medicine. Discuss the foods you eat and the vitamins you take with your health care professional. ?What side effects may I notice from receiving this medication? ?Side effects that you should report to your doctor or health care professional as soon as possible: ?allergic reactions like skin rash, itching or hives, swelling of the face, lips, or tongue ?seizures ?signs and symptoms of a blood clot such as breathing problems; changes in vision; chest pain; severe, sudden headache; pain, swelling, warmth in the leg; trouble speaking; sudden numbness or weakness of the face, arm or leg ?signs and symptoms of a stroke like   changes in vision; confusion; trouble speaking or understanding; severe headaches; sudden numbness or weakness of the face, arm or leg; trouble walking; dizziness; loss of balance or coordination ?Side effects that usually do not require medical attention (report to your doctor or health care professional if they continue or are  bothersome): ?chills ?cough ?dizziness ?fever ?headaches ?joint pain ?muscle cramps ?muscle pain ?nausea, vomiting ?pain, redness, or irritation at site where injected ?This list may not describe all possible side effects. Call your doctor for medical advice about side effects. You may report side effects to FDA at 1-800-FDA-1088. ?Where should I keep my medication? ?Keep out of the reach of children. ?Store in a refrigerator between 2 and 8 degrees C (36 and 46 degrees F). Do not freeze or shake. Throw away any unused portion if using a single-dose vial. Multi-dose vials can be kept in the refrigerator for up to 21 days after the initial dose. Throw away unused medicine. ?NOTE: This sheet is a summary. It may not cover all possible information. If you have questions about this medicine, talk to your doctor, pharmacist, or health care provider. ?? 2023 [employer]/Gold Standard (2017-02-23 00:00:00) ? ?

## 2022-01-19 DIAGNOSIS — M0579 Rheumatoid arthritis with rheumatoid factor of multiple sites without organ or systems involvement: Secondary | ICD-10-CM | POA: Diagnosis not present

## 2022-01-23 DIAGNOSIS — M25561 Pain in right knee: Secondary | ICD-10-CM | POA: Diagnosis not present

## 2022-01-24 DIAGNOSIS — Z79891 Long term (current) use of opiate analgesic: Secondary | ICD-10-CM | POA: Diagnosis not present

## 2022-01-26 DIAGNOSIS — M25561 Pain in right knee: Secondary | ICD-10-CM | POA: Diagnosis not present

## 2022-01-26 DIAGNOSIS — Z96651 Presence of right artificial knee joint: Secondary | ICD-10-CM | POA: Diagnosis not present

## 2022-02-02 DIAGNOSIS — M5416 Radiculopathy, lumbar region: Secondary | ICD-10-CM | POA: Diagnosis not present

## 2022-02-02 DIAGNOSIS — R262 Difficulty in walking, not elsewhere classified: Secondary | ICD-10-CM | POA: Diagnosis not present

## 2022-02-02 DIAGNOSIS — K219 Gastro-esophageal reflux disease without esophagitis: Secondary | ICD-10-CM | POA: Diagnosis not present

## 2022-02-02 DIAGNOSIS — M069 Rheumatoid arthritis, unspecified: Secondary | ICD-10-CM | POA: Diagnosis not present

## 2022-02-02 DIAGNOSIS — M25561 Pain in right knee: Secondary | ICD-10-CM | POA: Diagnosis not present

## 2022-02-02 DIAGNOSIS — I10 Essential (primary) hypertension: Secondary | ICD-10-CM | POA: Diagnosis not present

## 2022-02-11 DIAGNOSIS — M25561 Pain in right knee: Secondary | ICD-10-CM | POA: Diagnosis not present

## 2022-02-13 ENCOUNTER — Inpatient Hospital Stay: Payer: Medicare Other | Attending: Hematology & Oncology

## 2022-02-13 ENCOUNTER — Inpatient Hospital Stay: Payer: Medicare Other

## 2022-02-13 VITALS — BP 125/74 | HR 87 | Temp 97.9°F | Resp 16

## 2022-02-13 DIAGNOSIS — D631 Anemia in chronic kidney disease: Secondary | ICD-10-CM | POA: Insufficient documentation

## 2022-02-13 DIAGNOSIS — D500 Iron deficiency anemia secondary to blood loss (chronic): Secondary | ICD-10-CM

## 2022-02-13 DIAGNOSIS — Z79899 Other long term (current) drug therapy: Secondary | ICD-10-CM | POA: Insufficient documentation

## 2022-02-13 DIAGNOSIS — N1831 Chronic kidney disease, stage 3a: Secondary | ICD-10-CM | POA: Diagnosis not present

## 2022-02-13 DIAGNOSIS — K909 Intestinal malabsorption, unspecified: Secondary | ICD-10-CM

## 2022-02-13 DIAGNOSIS — I129 Hypertensive chronic kidney disease with stage 1 through stage 4 chronic kidney disease, or unspecified chronic kidney disease: Secondary | ICD-10-CM | POA: Insufficient documentation

## 2022-02-13 LAB — CMP (CANCER CENTER ONLY)
ALT: 15 U/L (ref 0–44)
AST: 18 U/L (ref 15–41)
Albumin: 4.1 g/dL (ref 3.5–5.0)
Alkaline Phosphatase: 65 U/L (ref 38–126)
Anion gap: 9 (ref 5–15)
BUN: 20 mg/dL (ref 8–23)
CO2: 24 mmol/L (ref 22–32)
Calcium: 9.4 mg/dL (ref 8.9–10.3)
Chloride: 106 mmol/L (ref 98–111)
Creatinine: 1.17 mg/dL — ABNORMAL HIGH (ref 0.44–1.00)
GFR, Estimated: 50 mL/min — ABNORMAL LOW (ref >60)
Glucose, Bld: 112 mg/dL — ABNORMAL HIGH (ref 70–99)
Potassium: 4.2 mmol/L (ref 3.5–5.1)
Sodium: 139 mmol/L (ref 135–145)
Total Bilirubin: 0.5 mg/dL (ref 0.3–1.2)
Total Protein: 6.2 g/dL — ABNORMAL LOW (ref 6.5–8.1)

## 2022-02-13 LAB — CBC WITH DIFFERENTIAL (CANCER CENTER ONLY)
Abs Immature Granulocytes: 0.05 10*3/uL (ref 0.00–0.07)
Basophils Absolute: 0 10*3/uL (ref 0.0–0.1)
Basophils Relative: 0 %
Eosinophils Absolute: 0 10*3/uL (ref 0.0–0.5)
Eosinophils Relative: 0 %
HCT: 32.6 % — ABNORMAL LOW (ref 36.0–46.0)
Hemoglobin: 10.2 g/dL — ABNORMAL LOW (ref 12.0–15.0)
Immature Granulocytes: 1 %
Lymphocytes Relative: 31 %
Lymphs Abs: 1.8 10*3/uL (ref 0.7–4.0)
MCH: 34.2 pg — ABNORMAL HIGH (ref 26.0–34.0)
MCHC: 31.3 g/dL (ref 30.0–36.0)
MCV: 109.4 fL — ABNORMAL HIGH (ref 80.0–100.0)
Monocytes Absolute: 0.5 10*3/uL (ref 0.1–1.0)
Monocytes Relative: 8 %
Neutro Abs: 3.4 10*3/uL (ref 1.7–7.7)
Neutrophils Relative %: 60 %
Platelet Count: 142 10*3/uL — ABNORMAL LOW (ref 150–400)
RBC: 2.98 MIL/uL — ABNORMAL LOW (ref 3.87–5.11)
RDW: 16.6 % — ABNORMAL HIGH (ref 11.5–15.5)
WBC Count: 5.8 10*3/uL (ref 4.0–10.5)
nRBC: 0 % (ref 0.0–0.2)

## 2022-02-13 MED ORDER — EPOETIN ALFA-EPBX 40000 UNIT/ML IJ SOLN
40000.0000 [IU] | Freq: Once | INTRAMUSCULAR | Status: AC
  Administered 2022-02-13: 40000 [IU] via SUBCUTANEOUS
  Filled 2022-02-13: qty 1

## 2022-02-13 NOTE — Patient Instructions (Signed)

## 2022-02-16 DIAGNOSIS — M0579 Rheumatoid arthritis with rheumatoid factor of multiple sites without organ or systems involvement: Secondary | ICD-10-CM | POA: Diagnosis not present

## 2022-02-20 DIAGNOSIS — S93314A Dislocation of tarsal joint of right foot, initial encounter: Secondary | ICD-10-CM | POA: Diagnosis not present

## 2022-02-20 DIAGNOSIS — M24374 Pathological dislocation of right foot, not elsewhere classified: Secondary | ICD-10-CM | POA: Diagnosis not present

## 2022-02-20 DIAGNOSIS — M25571 Pain in right ankle and joints of right foot: Secondary | ICD-10-CM | POA: Diagnosis not present

## 2022-02-20 DIAGNOSIS — M79671 Pain in right foot: Secondary | ICD-10-CM | POA: Diagnosis not present

## 2022-02-25 DIAGNOSIS — K219 Gastro-esophageal reflux disease without esophagitis: Secondary | ICD-10-CM | POA: Diagnosis not present

## 2022-02-25 DIAGNOSIS — I10 Essential (primary) hypertension: Secondary | ICD-10-CM | POA: Diagnosis not present

## 2022-02-25 DIAGNOSIS — M069 Rheumatoid arthritis, unspecified: Secondary | ICD-10-CM | POA: Diagnosis not present

## 2022-02-25 DIAGNOSIS — R262 Difficulty in walking, not elsewhere classified: Secondary | ICD-10-CM | POA: Diagnosis not present

## 2022-03-05 DIAGNOSIS — D649 Anemia, unspecified: Secondary | ICD-10-CM | POA: Diagnosis not present

## 2022-03-05 DIAGNOSIS — R7303 Prediabetes: Secondary | ICD-10-CM | POA: Diagnosis not present

## 2022-03-05 DIAGNOSIS — Z01818 Encounter for other preprocedural examination: Secondary | ICD-10-CM | POA: Diagnosis not present

## 2022-03-11 ENCOUNTER — Other Ambulatory Visit: Payer: Medicare Other

## 2022-03-13 ENCOUNTER — Inpatient Hospital Stay: Payer: Medicare Other | Attending: Hematology & Oncology

## 2022-03-13 ENCOUNTER — Inpatient Hospital Stay: Payer: Medicare Other

## 2022-03-13 ENCOUNTER — Other Ambulatory Visit: Payer: Medicare Other

## 2022-03-13 ENCOUNTER — Encounter: Payer: Self-pay | Admitting: *Deleted

## 2022-03-13 VITALS — BP 125/84 | HR 83 | Temp 97.9°F

## 2022-03-13 DIAGNOSIS — Z79899 Other long term (current) drug therapy: Secondary | ICD-10-CM | POA: Diagnosis not present

## 2022-03-13 DIAGNOSIS — N183 Chronic kidney disease, stage 3 unspecified: Secondary | ICD-10-CM | POA: Insufficient documentation

## 2022-03-13 DIAGNOSIS — D500 Iron deficiency anemia secondary to blood loss (chronic): Secondary | ICD-10-CM

## 2022-03-13 DIAGNOSIS — I129 Hypertensive chronic kidney disease with stage 1 through stage 4 chronic kidney disease, or unspecified chronic kidney disease: Secondary | ICD-10-CM | POA: Diagnosis present

## 2022-03-13 DIAGNOSIS — K909 Intestinal malabsorption, unspecified: Secondary | ICD-10-CM

## 2022-03-13 DIAGNOSIS — D631 Anemia in chronic kidney disease: Secondary | ICD-10-CM | POA: Insufficient documentation

## 2022-03-13 LAB — CBC WITH DIFFERENTIAL (CANCER CENTER ONLY)
Abs Immature Granulocytes: 0.11 10*3/uL — ABNORMAL HIGH (ref 0.00–0.07)
Basophils Absolute: 0 10*3/uL (ref 0.0–0.1)
Basophils Relative: 0 %
Eosinophils Absolute: 0 10*3/uL (ref 0.0–0.5)
Eosinophils Relative: 0 %
HCT: 33.5 % — ABNORMAL LOW (ref 36.0–46.0)
Hemoglobin: 10.5 g/dL — ABNORMAL LOW (ref 12.0–15.0)
Immature Granulocytes: 2 %
Lymphocytes Relative: 32 %
Lymphs Abs: 2.2 10*3/uL (ref 0.7–4.0)
MCH: 34.8 pg — ABNORMAL HIGH (ref 26.0–34.0)
MCHC: 31.3 g/dL (ref 30.0–36.0)
MCV: 110.9 fL — ABNORMAL HIGH (ref 80.0–100.0)
Monocytes Absolute: 0.8 10*3/uL (ref 0.1–1.0)
Monocytes Relative: 11 %
Neutro Abs: 3.7 10*3/uL (ref 1.7–7.7)
Neutrophils Relative %: 55 %
Platelet Count: 162 10*3/uL (ref 150–400)
RBC: 3.02 MIL/uL — ABNORMAL LOW (ref 3.87–5.11)
RDW: 16.6 % — ABNORMAL HIGH (ref 11.5–15.5)
WBC Count: 6.8 10*3/uL (ref 4.0–10.5)
nRBC: 0.4 % — ABNORMAL HIGH (ref 0.0–0.2)

## 2022-03-13 LAB — CMP (CANCER CENTER ONLY)
ALT: 15 U/L (ref 0–44)
AST: 21 U/L (ref 15–41)
Albumin: 4.1 g/dL (ref 3.5–5.0)
Alkaline Phosphatase: 63 U/L (ref 38–126)
Anion gap: 8 (ref 5–15)
BUN: 21 mg/dL (ref 8–23)
CO2: 29 mmol/L (ref 22–32)
Calcium: 9.8 mg/dL (ref 8.9–10.3)
Chloride: 106 mmol/L (ref 98–111)
Creatinine: 1.34 mg/dL — ABNORMAL HIGH (ref 0.44–1.00)
GFR, Estimated: 42 mL/min — ABNORMAL LOW (ref >60)
Glucose, Bld: 123 mg/dL — ABNORMAL HIGH (ref 70–99)
Potassium: 4.3 mmol/L (ref 3.5–5.1)
Sodium: 143 mmol/L (ref 135–145)
Total Bilirubin: 0.5 mg/dL (ref 0.3–1.2)
Total Protein: 6.7 g/dL (ref 6.5–8.1)

## 2022-03-13 MED ORDER — EPOETIN ALFA-EPBX 40000 UNIT/ML IJ SOLN
40000.0000 [IU] | Freq: Once | INTRAMUSCULAR | Status: AC
  Administered 2022-03-13: 40000 [IU] via SUBCUTANEOUS
  Filled 2022-03-13: qty 1

## 2022-03-13 NOTE — Patient Instructions (Signed)

## 2022-03-16 ENCOUNTER — Telehealth: Payer: Self-pay | Admitting: *Deleted

## 2022-03-16 NOTE — Chronic Care Management (AMB) (Signed)
  Care Coordination   Note   03/16/2022 Name: [name] MRN: [id number] DOB: 02-11-1951  Helen Hardy is a 71 y.o. year old female who sees Deland Pretty, MD for primary care. I reached out to [place] by phone today to offer care coordination services.  Ms. Thoma was given information about Care Coordination services today including:   The Care Coordination services include support from the care team which includes your Nurse Coordinator, Clinical Social Worker, or Pharmacist.  The Care Coordination team is here to help remove barriers to the health concerns and goals most important to you. Care Coordination services are voluntary, and the patient may decline or stop services at any time by request to their care team member.   Care Coordination Consent Status: Patient agreed to services and verbal consent obtained.   Follow up plan:  Telephone appointment with care coordination team member scheduled for:  03/19/22  Encounter Outcome:  Pt. Scheduled  [place]  Direct Dial: [phone]

## 2022-03-19 ENCOUNTER — Other Ambulatory Visit (HOSPITAL_COMMUNITY): Payer: Self-pay | Admitting: Orthopedic Surgery

## 2022-03-19 ENCOUNTER — Ambulatory Visit: Payer: Self-pay

## 2022-03-20 NOTE — Patient Instructions (Signed)
Visit Information  Thank you for taking time to visit with me today. Please don't hesitate to contact me if I can be of assistance to you.   Following are the goals we discussed today:   Goals Addressed             This Visit's Progress    Care Coordination Activities- Follow up Scheduled       Care Coordination Interventions: Active listening / Reflection utilized  Emotional Support Provided Problem [place] strategies reviewed Discussed/.Educated Care Coordination Program  Discussed/.Educated Annual Wellness Visit Discussed/.Educated Social Determinates of Health Please inform PCP if services needed in the future  spoke with [name] in depth about her health history, medication, and her husband, who helps her a lot. She has RA, which has caused her to have many surgeries throughout the years. She is getting ready to have surgery on her right foot on 04/16/22. She is prayful that everything will be ok. That is her primary concern right now. We discussed continued service. She requested that I find out if her office has the flu shots. I called, and they have not received them yet. I notified the patient. She also requested that I follow up with her in three months. .        Our next appointment is by telephone on 12/14 at 10 am  Please call the care guide team at [phone] if you need to cancel or reschedule your appointment.   If you are experiencing a Mental Health or [place] or need someone to talk to, please call 1-800-273-TALK (toll free, 24 hour hotline)  The patient verbalized understanding of instructions, educational materials, and care plan provided today.   Lazaro Arms RN, BSN, [facility]   Phone: [phone]

## 2022-03-20 NOTE — Patient Outreach (Signed)
  Care Coordination   Initial Visit Note   03/19/22 Name: Helen Hardy MRN: [id number] DOB: 1950-12-22  Helen Hardy is a 71 y.o. year old female who sees Helen Pretty, MD for primary care. I spoke with  Helen Hardy by phone today.  What matters to the patients health and wellness today?  I ham going to have surgery on my right foot.    Goals Addressed             This Visit's Progress    Care Coordination Activities- Follow up Scheduled       Care Coordination Interventions: Active listening / Reflection utilized  Emotional Support Provided Problem [place] strategies reviewed Discussed/.Educated Care Coordination Program  Discussed/.Educated Annual Wellness Visit Discussed/.Educated Social Determinates of Health Please inform PCP if services needed in the future  spoke with Helen Hardy in depth about her health history, medication, and her husband, who helps her a lot. She has RA, which has caused her to have many surgeries throughout the years. She is getting ready to have surgery on her right foot on 04/16/22. She is prayful that everything will be ok. That is her primary concern right now. We discussed continued service. She requested that I find out if her office has the flu shots. I called, and they have not received them yet. I notified the patient. She also requested that I follow up with her in three months. Marland Kitchen        SDOH assessments and interventions completed:  Yes  SDOH Interventions Today    Flowsheet Row Most Recent Value  SDOH Interventions   Food Insecurity Interventions Intervention Not Indicated  Transportation Interventions Intervention Not Indicated        Care Coordination Interventions Activated:  Yes  Care Coordination Interventions:  Yes, provided   Follow up plan: Follow up call scheduled for 12/14 10 am    Encounter Outcome:  Pt. Visit Completed   Helen Arms RN, BSN, [facility]   Phone: [phone]

## 2022-03-22 DIAGNOSIS — M069 Rheumatoid arthritis, unspecified: Secondary | ICD-10-CM | POA: Diagnosis not present

## 2022-03-22 DIAGNOSIS — K219 Gastro-esophageal reflux disease without esophagitis: Secondary | ICD-10-CM | POA: Diagnosis not present

## 2022-03-22 DIAGNOSIS — R262 Difficulty in walking, not elsewhere classified: Secondary | ICD-10-CM | POA: Diagnosis not present

## 2022-03-22 DIAGNOSIS — I10 Essential (primary) hypertension: Secondary | ICD-10-CM | POA: Diagnosis not present

## 2022-03-24 DIAGNOSIS — M0579 Rheumatoid arthritis with rheumatoid factor of multiple sites without organ or systems involvement: Secondary | ICD-10-CM | POA: Diagnosis not present

## 2022-03-27 DIAGNOSIS — M5416 Radiculopathy, lumbar region: Secondary | ICD-10-CM | POA: Diagnosis not present

## 2022-03-27 DIAGNOSIS — M5116 Intervertebral disc disorders with radiculopathy, lumbar region: Secondary | ICD-10-CM | POA: Diagnosis not present

## 2022-03-29 ENCOUNTER — Emergency Department (HOSPITAL_COMMUNITY): Payer: Medicare Other

## 2022-03-29 ENCOUNTER — Inpatient Hospital Stay (HOSPITAL_COMMUNITY): Payer: Medicare Other

## 2022-03-29 ENCOUNTER — Encounter (HOSPITAL_COMMUNITY): Payer: Self-pay

## 2022-03-29 ENCOUNTER — Inpatient Hospital Stay (HOSPITAL_COMMUNITY)
Admission: EM | Admit: 2022-03-29 | Discharge: 2022-05-06 | DRG: 853 | Disposition: E | Payer: Medicare Other | Attending: Pulmonary Disease | Admitting: Pulmonary Disease

## 2022-03-29 ENCOUNTER — Emergency Department (HOSPITAL_COMMUNITY): Payer: Medicare Other | Admitting: Certified Registered Nurse Anesthetist

## 2022-03-29 ENCOUNTER — Encounter (HOSPITAL_COMMUNITY): Admission: EM | Disposition: E | Payer: Self-pay | Source: Home / Self Care | Attending: Pulmonary Disease

## 2022-03-29 DIAGNOSIS — N321 Vesicointestinal fistula: Secondary | ICD-10-CM | POA: Diagnosis present

## 2022-03-29 DIAGNOSIS — J9811 Atelectasis: Secondary | ICD-10-CM | POA: Diagnosis not present

## 2022-03-29 DIAGNOSIS — R6521 Severe sepsis with septic shock: Secondary | ICD-10-CM | POA: Diagnosis present

## 2022-03-29 DIAGNOSIS — D509 Iron deficiency anemia, unspecified: Secondary | ICD-10-CM | POA: Diagnosis present

## 2022-03-29 DIAGNOSIS — D539 Nutritional anemia, unspecified: Secondary | ICD-10-CM | POA: Diagnosis present

## 2022-03-29 DIAGNOSIS — Z7952 Long term (current) use of systemic steroids: Secondary | ICD-10-CM

## 2022-03-29 DIAGNOSIS — Z20822 Contact with and (suspected) exposure to covid-19: Secondary | ICD-10-CM | POA: Diagnosis present

## 2022-03-29 DIAGNOSIS — Z6841 Body Mass Index (BMI) 40.0 and over, adult: Secondary | ICD-10-CM | POA: Diagnosis not present

## 2022-03-29 DIAGNOSIS — N183 Chronic kidney disease, stage 3 unspecified: Secondary | ICD-10-CM | POA: Diagnosis not present

## 2022-03-29 DIAGNOSIS — M069 Rheumatoid arthritis, unspecified: Secondary | ICD-10-CM | POA: Diagnosis present

## 2022-03-29 DIAGNOSIS — Z8572 Personal history of non-Hodgkin lymphomas: Secondary | ICD-10-CM

## 2022-03-29 DIAGNOSIS — L8990 Pressure ulcer of unspecified site, unspecified stage: Secondary | ICD-10-CM | POA: Insufficient documentation

## 2022-03-29 DIAGNOSIS — Z66 Do not resuscitate: Secondary | ICD-10-CM | POA: Diagnosis present

## 2022-03-29 DIAGNOSIS — E2740 Unspecified adrenocortical insufficiency: Secondary | ICD-10-CM | POA: Diagnosis present

## 2022-03-29 DIAGNOSIS — T8189XA Other complications of procedures, not elsewhere classified, initial encounter: Secondary | ICD-10-CM | POA: Diagnosis not present

## 2022-03-29 DIAGNOSIS — D696 Thrombocytopenia, unspecified: Secondary | ICD-10-CM | POA: Diagnosis not present

## 2022-03-29 DIAGNOSIS — K219 Gastro-esophageal reflux disease without esophagitis: Secondary | ICD-10-CM | POA: Diagnosis present

## 2022-03-29 DIAGNOSIS — E669 Obesity, unspecified: Secondary | ICD-10-CM | POA: Diagnosis not present

## 2022-03-29 DIAGNOSIS — R000 Tachycardia, unspecified: Secondary | ICD-10-CM | POA: Diagnosis not present

## 2022-03-29 DIAGNOSIS — J9690 Respiratory failure, unspecified, unspecified whether with hypoxia or hypercapnia: Secondary | ICD-10-CM | POA: Diagnosis not present

## 2022-03-29 DIAGNOSIS — K567 Ileus, unspecified: Secondary | ICD-10-CM | POA: Diagnosis not present

## 2022-03-29 DIAGNOSIS — I130 Hypertensive heart and chronic kidney disease with heart failure and stage 1 through stage 4 chronic kidney disease, or unspecified chronic kidney disease: Secondary | ICD-10-CM | POA: Diagnosis present

## 2022-03-29 DIAGNOSIS — K631 Perforation of intestine (nontraumatic): Secondary | ICD-10-CM

## 2022-03-29 DIAGNOSIS — I10 Essential (primary) hypertension: Secondary | ICD-10-CM | POA: Diagnosis not present

## 2022-03-29 DIAGNOSIS — R0600 Dyspnea, unspecified: Secondary | ICD-10-CM | POA: Diagnosis not present

## 2022-03-29 DIAGNOSIS — Z515 Encounter for palliative care: Secondary | ICD-10-CM | POA: Diagnosis not present

## 2022-03-29 DIAGNOSIS — R34 Anuria and oliguria: Secondary | ICD-10-CM | POA: Diagnosis present

## 2022-03-29 DIAGNOSIS — K650 Generalized (acute) peritonitis: Secondary | ICD-10-CM | POA: Diagnosis not present

## 2022-03-29 DIAGNOSIS — D631 Anemia in chronic kidney disease: Secondary | ICD-10-CM | POA: Diagnosis present

## 2022-03-29 DIAGNOSIS — K659 Peritonitis, unspecified: Secondary | ICD-10-CM | POA: Diagnosis not present

## 2022-03-29 DIAGNOSIS — I5020 Unspecified systolic (congestive) heart failure: Secondary | ICD-10-CM | POA: Clinically undetermined

## 2022-03-29 DIAGNOSIS — R109 Unspecified abdominal pain: Secondary | ICD-10-CM | POA: Diagnosis not present

## 2022-03-29 DIAGNOSIS — K5720 Diverticulitis of large intestine with perforation and abscess without bleeding: Secondary | ICD-10-CM

## 2022-03-29 DIAGNOSIS — D849 Immunodeficiency, unspecified: Secondary | ICD-10-CM | POA: Diagnosis present

## 2022-03-29 DIAGNOSIS — A419 Sepsis, unspecified organism: Secondary | ICD-10-CM

## 2022-03-29 DIAGNOSIS — K658 Other peritonitis: Secondary | ICD-10-CM | POA: Diagnosis present

## 2022-03-29 DIAGNOSIS — R1084 Generalized abdominal pain: Secondary | ICD-10-CM | POA: Diagnosis not present

## 2022-03-29 DIAGNOSIS — R197 Diarrhea, unspecified: Secondary | ICD-10-CM | POA: Diagnosis not present

## 2022-03-29 DIAGNOSIS — K668 Other specified disorders of peritoneum: Secondary | ICD-10-CM | POA: Diagnosis not present

## 2022-03-29 DIAGNOSIS — K5732 Diverticulitis of large intestine without perforation or abscess without bleeding: Secondary | ICD-10-CM | POA: Diagnosis not present

## 2022-03-29 DIAGNOSIS — K9189 Other postprocedural complications and disorders of digestive system: Secondary | ICD-10-CM | POA: Diagnosis not present

## 2022-03-29 DIAGNOSIS — Z7189 Other specified counseling: Secondary | ICD-10-CM | POA: Diagnosis not present

## 2022-03-29 DIAGNOSIS — M1990 Unspecified osteoarthritis, unspecified site: Secondary | ICD-10-CM

## 2022-03-29 DIAGNOSIS — F1120 Opioid dependence, uncomplicated: Secondary | ICD-10-CM | POA: Diagnosis present

## 2022-03-29 DIAGNOSIS — R0603 Acute respiratory distress: Secondary | ICD-10-CM | POA: Diagnosis not present

## 2022-03-29 DIAGNOSIS — D62 Acute posthemorrhagic anemia: Secondary | ICD-10-CM | POA: Diagnosis present

## 2022-03-29 DIAGNOSIS — N171 Acute kidney failure with acute cortical necrosis: Secondary | ICD-10-CM

## 2022-03-29 DIAGNOSIS — R079 Chest pain, unspecified: Secondary | ICD-10-CM | POA: Diagnosis not present

## 2022-03-29 DIAGNOSIS — I129 Hypertensive chronic kidney disease with stage 1 through stage 4 chronic kidney disease, or unspecified chronic kidney disease: Secondary | ICD-10-CM | POA: Diagnosis not present

## 2022-03-29 DIAGNOSIS — N1831 Chronic kidney disease, stage 3a: Secondary | ICD-10-CM

## 2022-03-29 DIAGNOSIS — R140 Abdominal distension (gaseous): Secondary | ICD-10-CM | POA: Diagnosis not present

## 2022-03-29 DIAGNOSIS — I259 Chronic ischemic heart disease, unspecified: Secondary | ICD-10-CM | POA: Diagnosis present

## 2022-03-29 DIAGNOSIS — N179 Acute kidney failure, unspecified: Secondary | ICD-10-CM

## 2022-03-29 DIAGNOSIS — Z0389 Encounter for observation for other suspected diseases and conditions ruled out: Secondary | ICD-10-CM | POA: Diagnosis not present

## 2022-03-29 DIAGNOSIS — Z79899 Other long term (current) drug therapy: Secondary | ICD-10-CM

## 2022-03-29 DIAGNOSIS — R739 Hyperglycemia, unspecified: Secondary | ICD-10-CM | POA: Diagnosis not present

## 2022-03-29 DIAGNOSIS — R112 Nausea with vomiting, unspecified: Secondary | ICD-10-CM | POA: Diagnosis not present

## 2022-03-29 DIAGNOSIS — R0902 Hypoxemia: Secondary | ICD-10-CM | POA: Diagnosis not present

## 2022-03-29 DIAGNOSIS — Z885 Allergy status to narcotic agent status: Secondary | ICD-10-CM

## 2022-03-29 DIAGNOSIS — R319 Hematuria, unspecified: Secondary | ICD-10-CM | POA: Diagnosis not present

## 2022-03-29 DIAGNOSIS — J9601 Acute respiratory failure with hypoxia: Secondary | ICD-10-CM | POA: Diagnosis not present

## 2022-03-29 DIAGNOSIS — R579 Shock, unspecified: Secondary | ICD-10-CM | POA: Diagnosis not present

## 2022-03-29 DIAGNOSIS — Z96653 Presence of artificial knee joint, bilateral: Secondary | ICD-10-CM | POA: Diagnosis present

## 2022-03-29 DIAGNOSIS — R918 Other nonspecific abnormal finding of lung field: Secondary | ICD-10-CM | POA: Diagnosis not present

## 2022-03-29 DIAGNOSIS — G8929 Other chronic pain: Secondary | ICD-10-CM | POA: Diagnosis present

## 2022-03-29 DIAGNOSIS — R1111 Vomiting without nausea: Secondary | ICD-10-CM | POA: Diagnosis not present

## 2022-03-29 DIAGNOSIS — J90 Pleural effusion, not elsewhere classified: Secondary | ICD-10-CM | POA: Diagnosis not present

## 2022-03-29 DIAGNOSIS — R7401 Elevation of levels of liver transaminase levels: Secondary | ICD-10-CM | POA: Diagnosis not present

## 2022-03-29 DIAGNOSIS — Z4682 Encounter for fitting and adjustment of non-vascular catheter: Secondary | ICD-10-CM | POA: Diagnosis not present

## 2022-03-29 HISTORY — PX: LAPAROTOMY: SHX154

## 2022-03-29 LAB — CBC WITH DIFFERENTIAL/PLATELET
Abs Immature Granulocytes: 0.03 10*3/uL (ref 0.00–0.07)
Abs Immature Granulocytes: 0.03 10*3/uL (ref 0.00–0.07)
Basophils Absolute: 0 10*3/uL (ref 0.0–0.1)
Basophils Absolute: 0 10*3/uL (ref 0.0–0.1)
Basophils Relative: 1 %
Basophils Relative: 1 %
Eosinophils Absolute: 0.2 10*3/uL (ref 0.0–0.5)
Eosinophils Absolute: 0 10*3/uL (ref 0.0–0.5)
Eosinophils Relative: 5 %
Eosinophils Relative: 0 %
HCT: 40 % (ref 36.0–46.0)
HCT: 38.8 % (ref 36.0–46.0)
Hemoglobin: 12.4 g/dL (ref 12.0–15.0)
Hemoglobin: 11.9 g/dL — ABNORMAL LOW (ref 12.0–15.0)
Immature Granulocytes: 1 %
Immature Granulocytes: 1 %
Lymphocytes Relative: 57 %
Lymphocytes Relative: 55 %
Lymphs Abs: 2.5 10*3/uL (ref 0.7–4.0)
Lymphs Abs: 1.7 10*3/uL (ref 0.7–4.0)
MCH: 34.7 pg — ABNORMAL HIGH (ref 26.0–34.0)
MCH: 34.6 pg — ABNORMAL HIGH (ref 26.0–34.0)
MCHC: 31 g/dL (ref 30.0–36.0)
MCHC: 30.7 g/dL (ref 30.0–36.0)
MCV: 112 fL — ABNORMAL HIGH (ref 80.0–100.0)
MCV: 112.8 fL — ABNORMAL HIGH (ref 80.0–100.0)
Monocytes Absolute: 0.2 10*3/uL (ref 0.1–1.0)
Monocytes Absolute: 0.3 10*3/uL (ref 0.1–1.0)
Monocytes Relative: 5 %
Monocytes Relative: 8 %
Neutro Abs: 1.3 10*3/uL — ABNORMAL LOW (ref 1.7–7.7)
Neutro Abs: 1.1 10*3/uL — ABNORMAL LOW (ref 1.7–7.7)
Neutrophils Relative %: 31 %
Neutrophils Relative %: 35 %
Platelets: 196 10*3/uL (ref 150–400)
Platelets: 179 10*3/uL (ref 150–400)
RBC: 3.57 MIL/uL — ABNORMAL LOW (ref 3.87–5.11)
RBC: 3.44 MIL/uL — ABNORMAL LOW (ref 3.87–5.11)
RDW: 16.6 % — ABNORMAL HIGH (ref 11.5–15.5)
RDW: 16.7 % — ABNORMAL HIGH (ref 11.5–15.5)
WBC: 4.3 10*3/uL (ref 4.0–10.5)
WBC: 3.1 10*3/uL — ABNORMAL LOW (ref 4.0–10.5)
nRBC: 2.1 % — ABNORMAL HIGH (ref 0.0–0.2)
nRBC: 3.6 % — ABNORMAL HIGH (ref 0.0–0.2)

## 2022-03-29 LAB — APTT: aPTT: 23 seconds — ABNORMAL LOW (ref 24–36)

## 2022-03-29 LAB — BASIC METABOLIC PANEL
Anion gap: 8 (ref 5–15)
BUN: 26 mg/dL — ABNORMAL HIGH (ref 8–23)
CO2: 21 mmol/L — ABNORMAL LOW (ref 22–32)
Calcium: 8.4 mg/dL — ABNORMAL LOW (ref 8.9–10.3)
Chloride: 112 mmol/L — ABNORMAL HIGH (ref 98–111)
Creatinine, Ser: 1.35 mg/dL — ABNORMAL HIGH (ref 0.44–1.00)
GFR, Estimated: 42 mL/min — ABNORMAL LOW (ref >60)
Glucose, Bld: 117 mg/dL — ABNORMAL HIGH (ref 70–99)
Potassium: 4 mmol/L (ref 3.5–5.1)
Sodium: 141 mmol/L (ref 135–145)

## 2022-03-29 LAB — COMPREHENSIVE METABOLIC PANEL
ALT: 17 U/L (ref 0–44)
AST: 25 U/L (ref 15–41)
Albumin: 3.6 g/dL (ref 3.5–5.0)
Alkaline Phosphatase: 72 U/L (ref 38–126)
Anion gap: 13 (ref 5–15)
BUN: 26 mg/dL — ABNORMAL HIGH (ref 8–23)
CO2: 21 mmol/L — ABNORMAL LOW (ref 22–32)
Calcium: 9.3 mg/dL (ref 8.9–10.3)
Chloride: 111 mmol/L (ref 98–111)
Creatinine, Ser: 1.35 mg/dL — ABNORMAL HIGH (ref 0.44–1.00)
GFR, Estimated: 42 mL/min — ABNORMAL LOW (ref >60)
Glucose, Bld: 138 mg/dL — ABNORMAL HIGH (ref 70–99)
Potassium: 4.2 mmol/L (ref 3.5–5.1)
Sodium: 145 mmol/L (ref 135–145)
Total Bilirubin: 0.7 mg/dL (ref 0.3–1.2)
Total Protein: 7.5 g/dL (ref 6.5–8.1)

## 2022-03-29 LAB — PROTIME-INR
INR: 1.3 — ABNORMAL HIGH (ref 0.8–1.2)
Prothrombin Time: 15.8 seconds — ABNORMAL HIGH (ref 11.4–15.2)

## 2022-03-29 LAB — LACTIC ACID, PLASMA: Lactic Acid, Venous: 3.8 mmol/L (ref 0.5–1.9)

## 2022-03-29 LAB — RESP PANEL BY RT-PCR (FLU A&B, COVID) ARPGX2
Influenza A by PCR: NEGATIVE
Influenza B by PCR: NEGATIVE
SARS Coronavirus 2 by RT PCR: NEGATIVE

## 2022-03-29 LAB — TYPE AND SCREEN
ABO/RH(D): O POS
Antibody Screen: NEGATIVE

## 2022-03-29 LAB — LIPASE, BLOOD: Lipase: 22 U/L (ref 11–51)

## 2022-03-29 LAB — TROPONIN I (HIGH SENSITIVITY): Troponin I (High Sensitivity): 20 ng/L — ABNORMAL HIGH (ref <18)

## 2022-03-29 SURGERY — LAPAROTOMY, EXPLORATORY
Anesthesia: General

## 2022-03-29 MED ORDER — METRONIDAZOLE 500 MG/100ML IV SOLN
500.0000 mg | Freq: Once | INTRAVENOUS | Status: AC
  Administered 2022-03-29: 500 mg via INTRAVENOUS
  Filled 2022-03-29: qty 100

## 2022-03-29 MED ORDER — OXYCODONE-ACETAMINOPHEN 5-325 MG PO TABS
1.0000 | ORAL_TABLET | Freq: Once | ORAL | Status: AC
  Administered 2022-03-29: 1 via ORAL
  Filled 2022-03-29: qty 1

## 2022-03-29 MED ORDER — SODIUM BICARBONATE 8.4 % IV SOLN
INTRAVENOUS | Status: DC | PRN
  Administered 2022-03-29: 25 meq via INTRAVENOUS
  Administered 2022-03-29: 50 meq via INTRAVENOUS

## 2022-03-29 MED ORDER — LACTATED RINGERS IV BOLUS (SEPSIS): 1000.0000 mL | Freq: Once | INTRAVENOUS | Status: DC

## 2022-03-29 MED ORDER — ACETAMINOPHEN 10 MG/ML IV SOLN
1000.0000 mg | Freq: Four times a day (QID) | INTRAVENOUS | Status: AC
  Administered 2022-03-29 – 2022-03-30 (×4): 1000 mg via INTRAVENOUS
  Filled 2022-03-29 (×4): qty 100

## 2022-03-29 MED ORDER — ONDANSETRON HCL 4 MG/2ML IJ SOLN
4.0000 mg | Freq: Once | INTRAMUSCULAR | Status: AC
  Administered 2022-03-29: 4 mg via INTRAVENOUS
  Filled 2022-03-29: qty 2

## 2022-03-29 MED ORDER — SODIUM CHLORIDE 0.9 % IV SOLN: INTRAVENOUS | Status: DC | PRN

## 2022-03-29 MED ORDER — LACTATED RINGERS IV SOLN: INTRAVENOUS | Status: DC

## 2022-03-29 MED ORDER — NOREPINEPHRINE 4 MG/250ML-% IV SOLN: 0.0000 ug/min | INTRAVENOUS | Status: DC

## 2022-03-29 MED ORDER — PROPOFOL 1000 MG/100ML IV EMUL
5.0000 ug/kg/min | INTRAVENOUS | Status: DC
  Administered 2022-03-29: 20 ug/kg/min via INTRAVENOUS

## 2022-03-29 MED ORDER — ONDANSETRON HCL 4 MG/2ML IJ SOLN
4.0000 mg | Freq: Four times a day (QID) | INTRAMUSCULAR | Status: DC | PRN
  Administered 2022-04-02 – 2022-04-05 (×2): 4 mg via INTRAVENOUS
  Filled 2022-03-29 (×2): qty 2

## 2022-03-29 MED ORDER — FENTANYL 2500MCG IN NS 250ML (10MCG/ML) PREMIX INFUSION
25.0000 ug/h | INTRAVENOUS | Status: DC
  Administered 2022-03-29: 50 ug/h via INTRAVENOUS
  Administered 2022-03-30: 150 ug/h via INTRAVENOUS
  Administered 2022-03-31 – 2022-04-02 (×5): 200 ug/h via INTRAVENOUS
  Administered 2022-04-02: 150 ug/h via INTRAVENOUS
  Administered 2022-04-03: 200 ug/h via INTRAVENOUS
  Administered 2022-04-03: 150 ug/h via INTRAVENOUS
  Filled 2022-03-29 (×11): qty 250

## 2022-03-29 MED ORDER — FENTANYL CITRATE PF 50 MCG/ML IJ SOSY: 50.0000 ug | PREFILLED_SYRINGE | Freq: Once | INTRAMUSCULAR | Status: DC

## 2022-03-29 MED ORDER — SODIUM CHLORIDE (PF) 0.9 % IJ SOLN
INTRAMUSCULAR | Status: AC
  Filled 2022-03-29: qty 10

## 2022-03-29 MED ORDER — MIDAZOLAM HCL 2 MG/2ML IJ SOLN
INTRAMUSCULAR | Status: AC
  Filled 2022-03-29: qty 2

## 2022-03-29 MED ORDER — LACTATED RINGERS IV BOLUS (SEPSIS): 500.0000 mL | Freq: Once | INTRAVENOUS | Status: DC

## 2022-03-29 MED ORDER — EPHEDRINE SULFATE-NACL 50-0.9 MG/10ML-% IV SOSY
PREFILLED_SYRINGE | INTRAVENOUS | Status: DC | PRN
  Administered 2022-03-29: 15 mg via INTRAVENOUS
  Administered 2022-03-29: 10 mg via INTRAVENOUS
  Administered 2022-03-29: 15 mg via INTRAVENOUS

## 2022-03-29 MED ORDER — VASOPRESSIN 20 UNITS/100 ML INFUSION FOR SHOCK
0.0000 [IU]/min | INTRAVENOUS | Status: DC
  Administered 2022-03-29 – 2022-03-30 (×3): 0.03 [IU]/min via INTRAVENOUS
  Filled 2022-03-29 (×2): qty 100

## 2022-03-29 MED ORDER — VASOPRESSIN 20 UNITS/100 ML INFUSION FOR SHOCK
0.0000 [IU]/min | INTRAVENOUS | Status: AC
  Administered 2022-03-29: .03 [IU]/min via INTRAVENOUS
  Filled 2022-03-29: qty 100

## 2022-03-29 MED ORDER — PIPERACILLIN-TAZOBACTAM 3.375 G IVPB
3.3750 g | Freq: Three times a day (TID) | INTRAVENOUS | Status: DC
  Administered 2022-03-29 – 2022-04-05 (×18): 3.375 g via INTRAVENOUS
  Filled 2022-03-29 (×19): qty 50

## 2022-03-29 MED ORDER — VANCOMYCIN HCL IN DEXTROSE 1-5 GM/200ML-% IV SOLN
1000.0000 mg | Freq: Once | INTRAVENOUS | Status: AC
  Administered 2022-03-29: 1000 mg via INTRAVENOUS
  Filled 2022-03-29: qty 200

## 2022-03-29 MED ORDER — HYDROCORTISONE SOD SUC (PF) 100 MG IJ SOLR
100.0000 mg | Freq: Three times a day (TID) | INTRAMUSCULAR | Status: DC
  Administered 2022-03-29: 100 mg via INTRAVENOUS
  Filled 2022-03-29: qty 2

## 2022-03-29 MED ORDER — PHENYLEPHRINE HCL (PRESSORS) 10 MG/ML IV SOLN
INTRAVENOUS | Status: DC | PRN
  Administered 2022-03-29 (×2): 480 ug via INTRAVENOUS
  Administered 2022-03-29: 240 ug via INTRAVENOUS
  Administered 2022-03-29: 50 ug via INTRAVENOUS
  Administered 2022-03-29: 480 ug via INTRAVENOUS

## 2022-03-29 MED ORDER — METHOCARBAMOL 500 MG PO TABS
500.0000 mg | ORAL_TABLET | Freq: Three times a day (TID) | ORAL | Status: DC | PRN
  Administered 2022-04-02 – 2022-04-03 (×2): 500 mg
  Filled 2022-03-29 (×2): qty 1

## 2022-03-29 MED ORDER — HEPARIN SODIUM (PORCINE) 5000 UNIT/ML IJ SOLN
5000.0000 [IU] | Freq: Three times a day (TID) | INTRAMUSCULAR | Status: DC
  Administered 2022-03-30 – 2022-04-03 (×12): 5000 [IU] via SUBCUTANEOUS
  Filled 2022-03-29 (×12): qty 1

## 2022-03-29 MED ORDER — SODIUM CHLORIDE 0.9 % IR SOLN
Status: DC | PRN
  Administered 2022-03-29 (×6): 1000 mL

## 2022-03-29 MED ORDER — ONDANSETRON 4 MG PO TBDP
4.0000 mg | ORAL_TABLET | Freq: Once | ORAL | Status: AC
  Administered 2022-03-29: 4 mg via ORAL
  Filled 2022-03-29: qty 1

## 2022-03-29 MED ORDER — PHENYLEPHRINE 80 MCG/ML (10ML) SYRINGE FOR IV PUSH (FOR BLOOD PRESSURE SUPPORT)
PREFILLED_SYRINGE | INTRAVENOUS | Status: DC | PRN
  Administered 2022-03-29 (×4): 200 ug via INTRAVENOUS

## 2022-03-29 MED ORDER — PROPOFOL 10 MG/ML IV BOLUS
INTRAVENOUS | Status: DC | PRN
  Administered 2022-03-29: 100 mg via INTRAVENOUS

## 2022-03-29 MED ORDER — MIDAZOLAM HCL 2 MG/2ML IJ SOLN
INTRAMUSCULAR | Status: DC | PRN
  Administered 2022-03-29 (×2): 2 mg via INTRAVENOUS

## 2022-03-29 MED ORDER — PROPOFOL 10 MG/ML IV BOLUS
INTRAVENOUS | Status: AC
  Filled 2022-03-29: qty 20

## 2022-03-29 MED ORDER — FENTANYL CITRATE (PF) 250 MCG/5ML IJ SOLN
INTRAMUSCULAR | Status: AC
  Filled 2022-03-29: qty 5

## 2022-03-29 MED ORDER — PANTOPRAZOLE SODIUM 40 MG IV SOLR
40.0000 mg | INTRAVENOUS | Status: DC
  Administered 2022-03-29 – 2022-04-06 (×8): 40 mg via INTRAVENOUS
  Filled 2022-03-29 (×8): qty 10

## 2022-03-29 MED ORDER — IOHEXOL 350 MG/ML SOLN
75.0000 mL | Freq: Once | INTRAVENOUS | Status: AC | PRN
  Administered 2022-03-29: 75 mL via INTRAVENOUS

## 2022-03-29 MED ORDER — ONDANSETRON 4 MG PO TBDP: 4.0000 mg | ORAL_TABLET | Freq: Four times a day (QID) | ORAL | Status: DC | PRN

## 2022-03-29 MED ORDER — PHENYLEPHRINE HCL (PRESSORS) 10 MG/ML IV SOLN
INTRAVENOUS | Status: AC
  Filled 2022-03-29: qty 1

## 2022-03-29 MED ORDER — LACTATED RINGERS IV SOLN: INTRAVENOUS | Status: DC | PRN

## 2022-03-29 MED ORDER — LACTATED RINGERS IV BOLUS (SEPSIS)
1000.0000 mL | Freq: Once | INTRAVENOUS | Status: AC
  Administered 2022-03-29: 1000 mL via INTRAVENOUS

## 2022-03-29 MED ORDER — FENTANYL CITRATE PF 50 MCG/ML IJ SOSY
50.0000 ug | PREFILLED_SYRINGE | Freq: Once | INTRAMUSCULAR | Status: AC
  Administered 2022-03-29: 50 ug via INTRAVENOUS
  Filled 2022-03-29: qty 1

## 2022-03-29 MED ORDER — FENTANYL CITRATE PF 50 MCG/ML IJ SOSY
25.0000 ug | PREFILLED_SYRINGE | Freq: Once | INTRAMUSCULAR | Status: AC
  Administered 2022-03-29: 25 ug via INTRAVENOUS
  Filled 2022-03-29: qty 1

## 2022-03-29 MED ORDER — SUCCINYLCHOLINE CHLORIDE 200 MG/10ML IV SOSY
PREFILLED_SYRINGE | INTRAVENOUS | Status: DC | PRN
  Administered 2022-03-29: 200 mg via INTRAVENOUS

## 2022-03-29 MED ORDER — CALCIUM CHLORIDE 10 % IV SOLN
INTRAVENOUS | Status: DC | PRN
  Administered 2022-03-29: 1 g via INTRAVENOUS

## 2022-03-29 MED ORDER — VASOPRESSIN 20 UNIT/ML IV SOLN
INTRAVENOUS | Status: AC
  Filled 2022-03-29: qty 1

## 2022-03-29 MED ORDER — SODIUM CHLORIDE 0.9 % IV BOLUS
250.0000 mL | Freq: Once | INTRAVENOUS | Status: AC
  Administered 2022-03-29: 250 mL via INTRAVENOUS

## 2022-03-29 MED ORDER — SODIUM CHLORIDE 0.9 % IV SOLN: INTRAVENOUS | Status: DC

## 2022-03-29 MED ORDER — LIDOCAINE HCL (CARDIAC) PF 100 MG/5ML IV SOSY
PREFILLED_SYRINGE | INTRAVENOUS | Status: DC | PRN
  Administered 2022-03-29: 100 mg via INTRAVENOUS

## 2022-03-29 MED ORDER — VASOPRESSIN 20 UNITS/100 ML INFUSION FOR SHOCK: 0.0000 [IU]/min | INTRAVENOUS | Status: DC

## 2022-03-29 MED ORDER — ROCURONIUM BROMIDE 10 MG/ML (PF) SYRINGE
PREFILLED_SYRINGE | INTRAVENOUS | Status: DC | PRN
  Administered 2022-03-29 (×2): 100 mg via INTRAVENOUS

## 2022-03-29 MED ORDER — NOREPINEPHRINE 4 MG/250ML-% IV SOLN
0.0000 ug/min | INTRAVENOUS | Status: AC
  Administered 2022-03-29: 2 ug/min via INTRAVENOUS
  Filled 2022-03-29: qty 250

## 2022-03-29 MED ORDER — METHOCARBAMOL 1000 MG/10ML IJ SOLN: 500.0000 mg | Freq: Three times a day (TID) | INTRAVENOUS | Status: DC | PRN

## 2022-03-29 MED ORDER — FENTANYL CITRATE (PF) 250 MCG/5ML IJ SOLN
INTRAMUSCULAR | Status: DC | PRN
  Administered 2022-03-29 (×5): 50 ug via INTRAVENOUS

## 2022-03-29 MED ORDER — PHENYLEPHRINE HCL-NACL 20-0.9 MG/250ML-% IV SOLN
INTRAVENOUS | Status: DC | PRN
  Administered 2022-03-29: 60 ug/min via INTRAVENOUS

## 2022-03-29 MED ORDER — NOREPINEPHRINE 4 MG/250ML-% IV SOLN
0.0000 ug/min | INTRAVENOUS | Status: DC
  Administered 2022-03-29: 15 ug/min via INTRAVENOUS
  Administered 2022-03-30: 20 ug/min via INTRAVENOUS
  Administered 2022-03-30: 15 ug/min via INTRAVENOUS
  Filled 2022-03-29 (×2): qty 250

## 2022-03-29 MED ORDER — SODIUM CHLORIDE 0.9 % IV SOLN
2.0000 g | Freq: Once | INTRAVENOUS | Status: AC
  Administered 2022-03-29: 2 g via INTRAVENOUS
  Filled 2022-03-29: qty 12.5

## 2022-03-29 MED ORDER — FENTANYL BOLUS VIA INFUSION
25.0000 ug | INTRAVENOUS | Status: DC | PRN
  Administered 2022-03-30 – 2022-03-31 (×2): 50 ug via INTRAVENOUS
  Administered 2022-03-31: 100 ug via INTRAVENOUS
  Administered 2022-03-31: 50 ug via INTRAVENOUS
  Administered 2022-03-31 (×4): 100 ug via INTRAVENOUS
  Administered 2022-04-01 (×2): 50 ug via INTRAVENOUS
  Administered 2022-04-01: 100 ug via INTRAVENOUS
  Administered 2022-04-01 – 2022-04-02 (×3): 50 ug via INTRAVENOUS
  Administered 2022-04-02: 100 ug via INTRAVENOUS
  Administered 2022-04-02 (×3): 50 ug via INTRAVENOUS
  Administered 2022-04-02 – 2022-04-03 (×14): 100 ug via INTRAVENOUS
  Administered 2022-04-03: 50 ug via INTRAVENOUS
  Administered 2022-04-03: 100 ug via INTRAVENOUS
  Administered 2022-04-04: 75 ug via INTRAVENOUS
  Administered 2022-04-04: 100 ug via INTRAVENOUS
  Administered 2022-04-04: 50 ug via INTRAVENOUS
  Administered 2022-04-04: 100 ug via INTRAVENOUS
  Administered 2022-04-04: 50 ug via INTRAVENOUS
  Administered 2022-04-05 – 2022-04-06 (×3): 100 ug via INTRAVENOUS

## 2022-03-29 MED ORDER — ACETAMINOPHEN 325 MG PO TABS
650.0000 mg | ORAL_TABLET | Freq: Four times a day (QID) | ORAL | Status: DC | PRN
  Administered 2022-03-29: 650 mg via ORAL
  Filled 2022-03-29: qty 2

## 2022-03-29 MED ORDER — SODIUM CHLORIDE 0.9 % IV BOLUS
1000.0000 mL | Freq: Once | INTRAVENOUS | Status: AC
  Administered 2022-03-29: 1000 mL via INTRAVENOUS

## 2022-03-29 MED ORDER — PROPOFOL 500 MG/50ML IV EMUL
INTRAVENOUS | Status: DC | PRN
  Administered 2022-03-29: 25 ug/kg/min via INTRAVENOUS

## 2022-03-29 MED ORDER — VASOPRESSIN 20 UNIT/ML IV SOLN
INTRAVENOUS | Status: DC | PRN
  Administered 2022-03-29: 1 [IU] via INTRAVENOUS
  Administered 2022-03-29: 2 [IU] via INTRAVENOUS

## 2022-03-29 SURGICAL SUPPLY — 45 items
APL PRP STRL LF DISP 70% ISPRP (MISCELLANEOUS) ×1
BAG COUNTER SPONGE SURGICOUNT (BAG) IMPLANT
BAG SPNG CNTER NS LX DISP (BAG)
BLADE EXTENDED COATED 6.5IN (ELECTRODE) IMPLANT
CHLORAPREP W/TINT 26 (MISCELLANEOUS) ×2 IMPLANT
COVER MAYO STAND STRL (DRAPES) ×6 IMPLANT
COVER SURGICAL LIGHT HANDLE (MISCELLANEOUS) ×2 IMPLANT
DRAPE LAPAROSCOPIC ABDOMINAL (DRAPES) ×2 IMPLANT
DRSG OPSITE POSTOP 4X10 (GAUZE/BANDAGES/DRESSINGS) IMPLANT
DRSG OPSITE POSTOP 4X6 (GAUZE/BANDAGES/DRESSINGS) IMPLANT
DRSG OPSITE POSTOP 4X8 (GAUZE/BANDAGES/DRESSINGS) IMPLANT
DURAPREP 26ML APPLICATOR (WOUND CARE) IMPLANT
ELECT REM PT RETURN 15FT ADLT (MISCELLANEOUS) ×2 IMPLANT
GAUZE SPONGE 4X4 12PLY STRL (GAUZE/BANDAGES/DRESSINGS) IMPLANT
GLOVE BIO SURGEON STRL SZ7 (GLOVE) ×4 IMPLANT
GLOVE BIOGEL PI IND STRL 7.5 (GLOVE) ×4 IMPLANT
GOWN STRL REUS W/ TWL LRG LVL3 (GOWN DISPOSABLE) ×4 IMPLANT
GOWN STRL REUS W/TWL LRG LVL3 (GOWN DISPOSABLE) ×2
HANDLE SUCTION POOLE (INSTRUMENTS) IMPLANT
KIT TURNOVER KIT A (KITS) IMPLANT
PACK COLON (CUSTOM PROCEDURE TRAY) ×2 IMPLANT
PENCIL SMOKE EVACUATOR (MISCELLANEOUS) IMPLANT
SPONGE ABD ABTHERA ADVANCE (MISCELLANEOUS) IMPLANT
SPONGE ABDOMINAL VAC ABTHERA (MISCELLANEOUS) IMPLANT
SPONGE T-LAP 18X18 ~~LOC~~+RFID (SPONGE) IMPLANT
STAPLER CVD CUT GN 40 RELOAD (ENDOMECHANICALS) ×2 IMPLANT
STAPLER CVD CUT GRN 40 RELOAD (ENDOMECHANICALS) IMPLANT
STAPLER PROXIMATE 75MM BLUE (STAPLE) IMPLANT
STAPLER VISISTAT 35W (STAPLE) ×2 IMPLANT
SUCTION POOLE HANDLE (INSTRUMENTS)
SUT NOVA NAB GS-21 0 18 T12 DT (SUTURE) IMPLANT
SUT NOVA NAB GS-21 1 T12 (SUTURE) IMPLANT
SUT PDS AB 1 TP1 96 (SUTURE) IMPLANT
SUT SILK 2 0 (SUTURE) ×1
SUT SILK 2 0 SH CR/8 (SUTURE) ×2 IMPLANT
SUT SILK 2-0 18XBRD TIE 12 (SUTURE) ×2 IMPLANT
SUT SILK 3 0 (SUTURE) ×1
SUT SILK 3 0 SH CR/8 (SUTURE) ×2 IMPLANT
SUT SILK 3-0 18XBRD TIE 12 (SUTURE) ×2 IMPLANT
SUT VIC AB 2-0 SH 18 (SUTURE) IMPLANT
TOWEL OR 17X26 10 PK STRL BLUE (TOWEL DISPOSABLE) IMPLANT
TOWEL OR NON WOVEN STRL DISP B (DISPOSABLE) ×2 IMPLANT
TRAY FOLEY MTR SLVR 14FR STAT (SET/KITS/TRAYS/PACK) IMPLANT
TRAY FOLEY MTR SLVR 16FR STAT (SET/KITS/TRAYS/PACK) IMPLANT
TUBING CONNECTING 10 (TUBING) IMPLANT

## 2022-03-29 NOTE — Progress Notes (Signed)
A consult was received from an ED physician for Vanco, Cefepime per pharmacy dosing.  The patient's profile has been reviewed for ht/wt/allergies/indication/available labs.   A one time order has been placed for Vanco 1g IV x 1 and Cefepiem 2g IV x 1.  Further antibiotics/pharmacy consults should be ordered by admitting physician if indicated.                        Jeannifer Drakeford S. Alford Highland, PharmD, BCPS Clinical Staff Pharmacist [email or web address]  Thank you, Wayland Salinas 03/24/2022  5:48 PM

## 2022-03-29 NOTE — Anesthesia Procedure Notes (Addendum)
  Central Venous Catheter Insertion Performed by: Pervis Hocking, DO, anesthesiologist Start/End09/07/2021 8:40 PM, 03/26/2022 8:50 PM Patient location: OR. Emergency situation Preanesthetic checklist: patient identified, IV checked, site marked, risks and benefits discussed, surgical consent, monitors and equipment checked, pre-op evaluation, timeout performed and anesthesia consent Lidocaine 1% used for infiltration and patient sedated Hand hygiene performed  and maximum sterile barriers used  Catheter size: 12 Fr Total catheter length 16. Central line was placed.Triple lumen Procedure performed using ultrasound guided technique. Ultrasound Notes:anatomy identified, needle tip was noted to be adjacent to the nerve/plexus identified, no ultrasound evidence of intravascular and/or intraneural injection and image(s) printed for medical record Attempts: 1 Following insertion, dressing applied and line sutured. Post procedure assessment: blood return through all ports  Patient tolerated the procedure well with no immediate complications.

## 2022-03-29 NOTE — Sepsis Progress Note (Signed)
Sepsis protocol monitored by eLink 

## 2022-03-29 NOTE — ED Notes (Signed)
Patient complains of being cold, assessed for chills. Tachycardic on monitor rate 130. Placed on 12 lead monitoring. Temperature orally 102. Provider informed.

## 2022-03-29 NOTE — H&P (Addendum)
Helen Hardy is an 71 y.o. female.   Chief Complaint: ab pain HPI: 49 yof with mmp including CKD, HTN, history NHL, ischemic colitis, RA who presents with acute onset of diffuse abdominal pain this morning with n/v and some diarrhea. Pain worsening. Nothing doing at home making it better.  Denies fever prior to coming here. No chest pain or sob.  She arrived in the er around 2 pm and CT was done at around 430 pm.  I was called after 7 pm to see her.  She is tachycardic, febrile and tachypneic when I see her.  She complains of abdominal pain. Her family is present.   She was found on ct scan to have some free air and likely perforated diverticulitis.  I have reviewed ct scan.  Her lactate is elevated as well.   Past Medical History:  Diagnosis Date   Anemia    Anemia of chronic renal failure, stage 3 (moderate) ([place]) 12/30/2018   pt denies   GERD (gastroesophageal reflux disease)    History of ischemic colitis    Hospitalized   Hypertension    Malabsorption of iron 06/21/2015   Non Hodgkin's lymphoma (HCC)    Numbness and tingling of right leg    Pneumonia    x2   RA (rheumatoid arthritis) ([place])     Past Surgical History:  Procedure Laterality Date   2012 Right big toe and toe beside - had pins in toes     ABDOMINAL HYSTERECTOMY     BIOPSY  04/01/2020   Procedure: BIOPSY;  Surgeon: Juanita Craver, MD;  Location: [facility];  Service: Gastroenterology;;   bladder tack - 2005     COLONOSCOPY WITH PROPOFOL N/A 04/01/2020   Procedure: COLONOSCOPY WITH PROPOFOL;  Surgeon: Juanita Craver, MD;  Location: [facility];  Service: Gastroenterology;  Laterality: N/A;   EYE SURGERY Bilateral    cataract   I & D KNEE WITH POLY EXCHANGE Right 04/14/2021   Procedure: IRRIGATION AND DEBRIDEMENT KNEE WITH POLY EXCHANGE;  Surgeon: Paralee Cancel, MD;  Location: [facility];  Service: Orthopedics;  Laterality: Right;   JOINT REPLACEMENT Bilateral 1995, 1996   KNEE   KNEE REPAIR EXTENSOR MECHANISM Right  04/14/2021   Procedure: RIGHT KNEE EXTENSOR MECHANISM REPAIR;  Surgeon: Paralee Cancel, MD;  Location: [facility];  Service: Orthopedics;  Laterality: Right;   LUMBAR LAMINECTOMY/DECOMPRESSION MICRODISCECTOMY N/A 03/04/2016   Procedure: RIGHT FAR LATERAL DISCECTOMY L5-S1;  Surgeon: Melina Schools, MD;  Location: [facility];  Service: Orthopedics;  Laterality: N/A;   PORTACATH PLACEMENT     PORTACATH REMOVAL     ROTATOR CUFF REPAIR Left    TOTAL KNEE REVISION Left 04/08/2015   Procedure: REVISION LEFT TOTAL KNEE ;  Surgeon: Paralee Cancel, MD;  Location: [facility];  Service: Orthopedics;  Laterality: Left;   TOTAL KNEE REVISION Right 12/12/2020   Procedure: TOTAL KNEE REVISION;  Surgeon: Paralee Cancel, MD;  Location: [facility];  Service: Orthopedics;  Laterality: Right;    Family History  Problem Relation Age of Onset   Breast cancer Neg Hx    Social History:  reports that she has never smoked. She has never used smokeless tobacco. She reports that she does not drink alcohol and does not use drugs.  Allergies:  Allergies  Allergen Reactions   Oxycodone Nausea And Vomiting    Able tolerate with antinausea medicine, Pt is currently taking   Tramadol Other (See Comments)    UNKNOWN REACTION    No current facility-administered medications  on file prior to encounter.   Current Outpatient Medications on File Prior to Encounter  Medication Sig Dispense Refill   acetaminophen (TYLENOL) 500 MG tablet Take 2 tablets (1,000 mg total) by mouth every 8 (eight) hours. 30 tablet 0   Ascorbic Acid (VITAMIN C) 1000 MG tablet Take 1,000 mg by mouth in the morning.     cholecalciferol (VITAMIN D) 25 MCG (1000 UNIT) tablet Take 1,000 Units by mouth in the morning.     docusate sodium (COLACE) 100 MG capsule Take 1 capsule (100 mg total) by mouth 2 (two) times daily. (Patient taking differently: Take 100 mg by mouth in the morning.) 10 capsule 0   estradiol (ESTRACE) 1 MG tablet Take 1 mg by mouth in the morning.     folic  acid (FOLVITE) 1 MG tablet Take 2 mg by mouth in the morning.     furosemide (LASIX) 40 MG tablet Take 40 mg by mouth in the morning.     gabapentin (NEURONTIN) 300 MG capsule Take 300-600 mg by mouth See admin instructions. Take 1 capsule (300 mg) by mouth in the morning, take 1 capsule (300 mg) by mouth in the afternoon & take 2 capsules (600 mg) by mouth at night.  3   leflunomide (ARAVA) 20 MG tablet Take 1 tablet (20 mg total) by mouth in the morning. Hold for two weeks after surgery     methocarbamol (ROBAXIN) 500 MG tablet Take 1 tablet (500 mg total) by mouth every 6 (six) hours as needed for muscle spasms. 40 tablet 0   Multiple Vitamin (MULTIVITAMIN WITH MINERALS) TABS tablet Take 1 tablet by mouth in the morning.     olmesartan (BENICAR) 20 MG tablet Take 20 mg by mouth in the morning.     ondansetron (ZOFRAN) 4 MG tablet Take 4 mg by mouth every 8 (eight) hours as needed for nausea or vomiting.      ondansetron (ZOFRAN-ODT) 4 MG disintegrating tablet 72m ODT q4 hours prn nausea/vomit 12 tablet 0   ORENCIA 250 MG injection every 30 (thirty) days.     Oxycodone HCl 10 MG TABS Take 10 mg by mouth every 6 (six) hours.     pantoprazole (PROTONIX) 40 MG tablet Take 40 mg by mouth in the morning.     polyethylene glycol (MIRALAX / GLYCOLAX) 17 g packet Take 17 g by mouth daily as needed for mild constipation. 14 each 0   Potassium Chloride ER 20 MEQ TBCR Take 20 mEq by mouth in the morning.     predniSONE (DELTASONE) 10 MG tablet Take 10 mg by mouth daily with breakfast.  2   psyllium (METAMUCIL) 0.52 g capsule Take 1 capsule (0.52 g total) by mouth daily. 30 capsule 1   tiZANidine (ZANAFLEX) 4 MG tablet tizanidine 4 mg tablet  Take 1 tablet every day by oral route at bedtime.     vitamin B-12 (CYANOCOBALAMIN) 1000 MCG tablet Take 1,000 mcg by mouth in the morning.        Results for orders placed or performed during the hospital encounter of 03/11/2022 (from the past 48 hour(s))  CBC with  Differential     Status: Abnormal   Collection Time: 03/27/2022  2:35 PM  Result Value Ref Range   WBC 4.3 4.0 - 10.5 K/uL   RBC 3.57 (L) 3.87 - 5.11 MIL/uL   Hemoglobin 12.4 12.0 - 15.0 g/dL   HCT 40.0 36.0 - 46.0 %   MCV 112.0 (H) 80.0 - 100.0  fL   MCH 34.7 (H) 26.0 - 34.0 pg   MCHC 31.0 30.0 - 36.0 g/dL   RDW 16.6 (H) 11.5 - 15.5 %   Platelets 196 150 - 400 K/uL   nRBC 2.1 (H) 0.0 - 0.2 %   Neutrophils Relative % 31 %   Neutro Abs 1.3 (L) 1.7 - 7.7 K/uL   Lymphocytes Relative 57 %   Lymphs Abs 2.5 0.7 - 4.0 K/uL   Monocytes Relative 5 %   Monocytes Absolute 0.2 0.1 - 1.0 K/uL   Eosinophils Relative 5 %   Eosinophils Absolute 0.2 0.0 - 0.5 K/uL   Basophils Relative 1 %   Basophils Absolute 0.0 0.0 - 0.1 K/uL   Immature Granulocytes 1 %   Abs Immature Granulocytes 0.03 0.00 - 0.07 K/uL    Comment: Performed at [facility], [place] [address] [place], [place] [address]  Comprehensive metabolic panel     Status: Abnormal   Collection Time: 03/08/2022  2:35 PM  Result Value Ref Range   Sodium 145 135 - 145 mmol/L   Potassium 4.2 3.5 - 5.1 mmol/L   Chloride 111 98 - 111 mmol/L   CO2 21 (L) 22 - 32 mmol/L   Glucose, Bld 138 (H) 70 - 99 mg/dL    Comment: Glucose reference range applies only to samples taken after fasting for at least 8 hours.   BUN 26 (H) 8 - 23 mg/dL   Creatinine, Ser 1.35 (H) 0.44 - 1.00 mg/dL   Calcium 9.3 8.9 - 10.3 mg/dL   Total Protein 7.5 6.5 - 8.1 g/dL   Albumin 3.6 3.5 - 5.0 g/dL   AST 25 15 - 41 U/L   ALT 17 0 - 44 U/L   Alkaline Phosphatase 72 38 - 126 U/L   Total Bilirubin 0.7 0.3 - 1.2 mg/dL   GFR, Estimated 42 (L) >60 mL/min    Comment: (NOTE) Calculated using the CKD-EPI Creatinine Equation (2021)    Anion gap 13 5 - 15    Comment: Performed at [facility], [place] [address] [place], [place] [address]  Lipase, blood     Status: None   Collection Time: 03/24/2022  2:35 PM  Result Value Ref Range   Lipase 22 11  - 51 U/L    Comment: Performed at [facility], [place] [address] [place], [place] [address]  Resp Panel by RT-PCR (Flu A&B, Covid) Anterior Nasal Swab     Status: None   Collection Time: 03/18/2022  6:12 PM   Specimen: Anterior Nasal Swab  Result Value Ref Range   SARS Coronavirus 2 by RT PCR NEGATIVE NEGATIVE    Comment: (NOTE) SARS-CoV-2 target nucleic acids are NOT DETECTED.  The SARS-CoV-2 RNA is generally detectable in upper respiratory specimens during the acute phase of infection. The lowest concentration of SARS-CoV-2 viral copies this assay can detect is 138 copies/mL. A negative result does not preclude SARS-Cov-2 infection and should not be used as the sole basis for treatment or other patient management decisions. A negative result may occur with  improper specimen collection/handling, submission of specimen other than nasopharyngeal swab, presence of viral mutation(s) within the areas targeted by this assay, and inadequate number of viral copies(<138 copies/mL). A negative result must be combined with clinical observations, patient history, and epidemiological information. The expected result is Negative.  Fact Sheet for Patients:  EntrepreneurPulse.com.au  Fact Sheet for Healthcare Providers:  IncredibleEmployment.be  This test is no t yet  approved or cleared by the [place] and  has been authorized for detection and/or diagnosis of SARS-CoV-2 by FDA under an Emergency Use Authorization (EUA). This EUA will remain  in effect (meaning this test can be used) for the duration of the COVID-19 declaration under Section 564(b)(1) of the Act, 21 U.S.C.section 360bbb-3(b)(1), unless the authorization is terminated  or revoked sooner.       Influenza A by PCR NEGATIVE NEGATIVE   Influenza B by PCR NEGATIVE NEGATIVE    Comment: (NOTE) The Xpert Xpress SARS-CoV-2/FLU/RSV plus assay is intended as an aid in the  diagnosis of influenza from Nasopharyngeal swab specimens and should not be used as a sole basis for treatment. Nasal washings and aspirates are unacceptable for Xpert Xpress SARS-CoV-2/FLU/RSV testing.  Fact Sheet for Patients: EntrepreneurPulse.com.au  Fact Sheet for Healthcare Providers: IncredibleEmployment.be  This test is not yet approved or cleared by the [place] FDA and has been authorized for detection and/or diagnosis of SARS-CoV-2 by FDA under an Emergency Use Authorization (EUA). This EUA will remain in effect (meaning this test can be used) for the duration of the COVID-19 declaration under Section 564(b)(1) of the Act, 21 U.S.C. section 360bbb-3(b)(1), unless the authorization is terminated or revoked.  Performed at [facility], [place] [address] [place], [place] [address]   Protime-INR     Status: Abnormal   Collection Time: 03/09/2022  6:19 PM  Result Value Ref Range   Prothrombin Time 15.8 (H) 11.4 - 15.2 seconds   INR 1.3 (H) 0.8 - 1.2    Comment: (NOTE) INR goal varies based on device and disease states. Performed at [facility], [place] [address] [place], [place] [address]   APTT     Status: Abnormal   Collection Time: 03/09/2022  6:19 PM  Result Value Ref Range   aPTT 23 (L) 24 - 36 seconds    Comment: Performed at [facility], [place] [address], [place], [place] [address]  Troponin I (High Sensitivity)     Status: Abnormal   Collection Time: 03/15/2022  6:19 PM  Result Value Ref Range   Troponin I (High Sensitivity) 20 (H) <18 ng/L    Comment: (NOTE) Elevated high sensitivity troponin I (hsTnI) values and significant  changes across serial measurements may suggest ACS but many other  chronic and acute conditions are known to elevate hsTnI results.  Refer to the "Links" section for chest pain algorithms and additional  guidance. Performed at [facility], [place] [address] [place], [place] [address]   Basic metabolic panel     Status: Abnormal   Collection Time: 03/14/2022  6:19 PM  Result Value Ref Range   Sodium 141 135 - 145 mmol/L   Potassium 4.0 3.5 - 5.1 mmol/L   Chloride 112 (H) 98 - 111 mmol/L   CO2 21 (L) 22 - 32 mmol/L   Glucose, Bld 117 (H) 70 - 99 mg/dL    Comment: Glucose reference range applies only to samples taken after fasting for at least 8 hours.   BUN 26 (H) 8 - 23 mg/dL   Creatinine, Ser 1.35 (H) 0.44 - 1.00 mg/dL   Calcium 8.4 (L) 8.9 - 10.3 mg/dL   GFR, Estimated 42 (L) >60 mL/min    Comment: (NOTE) Calculated using the CKD-EPI Creatinine Equation (2021)    Anion gap 8 5 - 15    Comment: Performed at [facility], [place] [address] [place],  [place] [address]  CBC with Differential     Status: Abnormal   Collection Time: 03/12/2022  6:19 PM  Result Value Ref Range   WBC 3.1 (L) 4.0 - 10.5 K/uL   RBC 3.44 (L) 3.87 - 5.11 MIL/uL   Hemoglobin 11.9 (L) 12.0 - 15.0 g/dL   HCT 38.8 36.0 - 46.0 %   MCV 112.8 (H) 80.0 - 100.0 fL   MCH 34.6 (H) 26.0 - 34.0 pg   MCHC 30.7 30.0 - 36.0 g/dL   RDW 16.7 (H) 11.5 - 15.5 %   Platelets 179 150 - 400 K/uL   nRBC 3.6 (H) 0.0 - 0.2 %   Neutrophils Relative % 35 %   Neutro Abs 1.1 (L) 1.7 - 7.7 K/uL   Lymphocytes Relative 55 %   Lymphs Abs 1.7 0.7 - 4.0 K/uL   Monocytes Relative 8 %   Monocytes Absolute 0.3 0.1 - 1.0 K/uL   Eosinophils Relative 0 %   Eosinophils Absolute 0.0 0.0 - 0.5 K/uL   Basophils Relative 1 %   Basophils Absolute 0.0 0.0 - 0.1 K/uL   Immature Granulocytes 1 %   Abs Immature Granulocytes 0.03 0.00 - 0.07 K/uL   Reactive, Benign Lymphocytes PRESENT    Burr Cells PRESENT    Polychromasia PRESENT     Comment: Performed at [facility], [place] [address] [place], [place] [address]  Lactic acid, plasma     Status: Abnormal   Collection Time: 03/14/2022  6:20 PM  Result Value Ref Range   Lactic Acid, Venous 3.8  (HH) 0.5 - 1.9 mmol/L    Comment: CRITICAL RESULT CALLED TO, READ BACK BY AND VERIFIED WITH [place], A @ 1908, 1912, [place] Performed at [facility], [place] [address], [place], [place] [address]    CT Angio Abd/Pel W and/or Wo Contrast  Result Date: 03/30/2022 CLINICAL DATA:  Acute onset of severe abdominal pain, nausea and vomiting, and diarrhea. Clinical suspicion for bowel ischemia. Previous diverticulitis. EXAM: CTA ABDOMEN AND PELVIS WITHOUT AND WITH CONTRAST TECHNIQUE: Multidetector CT imaging of the abdomen and pelvis was performed using the standard protocol during bolus administration of intravenous contrast. Multiplanar reconstructed images and MIPs were obtained and reviewed to evaluate the vascular anatomy. RADIATION DOSE REDUCTION: This exam was performed according to the departmental dose-optimization program which includes automated exposure control, adjustment of the mA and/or kV according to patient size and/or use of iterative reconstruction technique. CONTRAST:  47m OMNIPAQUE IOHEXOL 350 MG/ML SOLN COMPARISON:  06/17/2021 FINDINGS: VASCULAR Aorta: Normal caliber aorta without aneurysm, dissection, vasculitis or significant stenosis. Celiac: Patent without evidence of aneurysm, dissection, vasculitis or significant stenosis. SMA: Patent without evidence of aneurysm, dissection, vasculitis or significant stenosis. Renals: Both renal arteries are patent without evidence of aneurysm, dissection, vasculitis, fibromuscular dysplasia or significant stenosis. IMA: Patent. Inflow: Patent without evidence of aneurysm, dissection, vasculitis or significant stenosis. Proximal Outflow: Bilateral common femoral and visualized portions of the superficial and profunda femoral arteries are patent without evidence of aneurysm, dissection, vasculitis or significant stenosis. Veins: No obvious venous abnormality within the limitations of this arterial phase study. Review of the MIP  images confirms the above findings. NON-VASCULAR Lower Chest: Bilateral lower lobe infiltrates. No evidence of pleural effusion. Hepatobiliary: No hepatic masses identified. Gallbladder is unremarkable. No evidence of biliary ductal dilatation. Pancreas:  No mass or inflammatory changes. Spleen: Within normal limits in size and appearance. Adrenals/Urinary Tract: No suspicious masses identified. No evidence of ureteral calculi  or hydronephrosis. Stomach/Bowel: Moderate amount of free intraperitoneal air is seen, consistent with bowel perforation. Moderate sigmoid diverticulitis is seen with adjacent extraluminal air bubbles, consistent with perforation. Adjacent diverticular abscess containing gas and fluid is also seen measuring 4.2 x 4.1 cm. Vascular/Lymphatic: No pathologically enlarged lymph nodes. No acute vascular findings. Reproductive: Prior hysterectomy noted. Adnexal regions are unremarkable in appearance. Other:  Small left inguinal hernia is seen which contains only fat. Musculoskeletal:  No suspicious bone lesions identified. IMPRESSION: No evidence of mesenteric ischemia or other acute vascular pathology. Perforated sigmoid diverticulitis, with moderate amount of free intraperitoneal air. 4.2 cm sigmoid diverticular abscess. Bilateral lower lobe infiltrates. Small left inguinal hernia, which contains only fat. Critical Value/emergent results were called by telephone at the time of interpretation on 03/14/2022 at 6:38 pm to provider [facility] , who verbally acknowledged these results. Electronically Signed   By: Marlaine Hind M.D.   On: 03/25/2022 18:41   DG Chest Port 1 View  Result Date: 03/14/2022 CLINICAL DATA:  Questionable sepsis. EXAM: PORTABLE CHEST 1 VIEW COMPARISON:  Nov 08, 2020 FINDINGS: Tortuosity of the thoracic aorta. Cardiomediastinal silhouette is normal. Mediastinal contours appear intact. There is no evidence of lobar airspace consolidation, pleural effusion or pneumothorax.  Low lung volumes. Bibasilar atelectasis versus peribronchial airspace consolidation. Osseous structures are without acute abnormality. Soft tissues are grossly normal. IMPRESSION: Low lung volumes with bibasilar atelectasis versus peribronchial airspace consolidation. Electronically Signed   By: Fidela Salisbury M.D.   On: 03/18/2022 18:15    Review of Systems  Constitutional:  Positive for fever.  Gastrointestinal:  Positive for abdominal distention, abdominal pain, nausea and vomiting.  All other systems reviewed and are negative.   Blood pressure (!) 143/101, pulse (!) 130, temperature (!) 102 F (38.9 C), temperature source Oral, resp. rate (!) 32, SpO2 98 %. Physical Exam Constitutional:      Appearance: She is toxic-appearing.  Eyes:     General: No scleral icterus.    Pupils: Pupils are equal, round, and reactive to light.  Cardiovascular:     Rate and Rhythm: Regular rhythm. Tachycardia present.  Pulmonary:     Effort: Pulmonary effort is normal.     Breath sounds: Normal breath sounds.  Abdominal:     General: There is distension.     Tenderness: There is generalized abdominal tenderness. There is guarding and rebound.     Hernia: No hernia is present.  Skin:    General: Skin is warm and dry.     Capillary Refill: Capillary refill takes less than 2 seconds.  Neurological:     General: No focal deficit present.     Mental Status: She is alert.  Psychiatric:        Mood and Affect: Mood is anxious.      Assessment/Plan Perforated diverticulitis Sepsis -she has some concerning features with leflunomide. She has history of ischemic colitis with similary sepsis picture in 2021 but this does appear to be different. She has peritonitis on exam, with vitals and immunosuppression she need to go to or urgently. I discussed elap with likely colectomy and colostomy with her and her family. -risks including reoperation, open wound, wound failure, mechanical ventilation among  others were discussed. This is certainly higher risk but have no choice at this point given clinical picture.  Will discuss stress dose steroids with anesthesia as well. -abx started in the er -plan for ICU likely intubated postop -number of notes mentioning Crohns but this is doubtful as  a diagnosis. 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  I discussed case with ER provider. Plan to proceed to surgery  This care required high  level of medical decision making.   Rolm Bookbinder, MD 03/20/2022, 7:41 PM  ACS RISK CALCULATOR USE:  Risk Calculator was used for discussion of surgery: Yes   I was unable to use calculator as was not functional tonight

## 2022-03-29 NOTE — ED Triage Notes (Signed)
Pt arrived via EMS, from home, c/o n/v, diarrhea. Diffuse abd pain

## 2022-03-29 NOTE — Progress Notes (Signed)
Pharmacy Antibiotic Note  Helen Hardy is a 71 y.o. female admitted on 03/06/2022 with  RA on chronic steroids, CKD, hypertension, presenting with acute onset diffuse abdominal pain. Pt found to have intraperitoneal free air, likely perforated diverticulitis.    She is s/p EL, with sigmoid colectomy, repair of bladder injury, possible fistula and placement of negative pressure dressing.  Pharmacy has been consulted for zosyn dosing.  Plan: Zosyn 3.375g IV Q8H infused over 4hrs. Follow renal function ,cultures   Height: 4' 11"  (149.9 cm) Weight: 103 kg (227 lb 1.2 oz) IBW/kg (Calculated) : 43.2  Temp (24hrs), Avg:99.6 F (37.6 C), Min:98.3 F (36.8 C), Max:102 F (38.9 C)  Recent Labs  Lab 03/24/2022 1435 04/02/2022 1819 03/24/2022 1820  WBC 4.3 3.1*  --   CREATININE 1.35* 1.35*  --   LATICACIDVEN  --   --  3.8*    Estimated Creatinine Clearance: 39.9 mL/min (A) (by C-G formula based on SCr of 1.35 mg/dL (H)).    Allergies  Allergen Reactions   Oxycodone Nausea And Vomiting    Able tolerate with antinausea medicine, Pt is currently taking   Tramadol Other (See Comments)    UNKNOWN REACTION    Antimicrobials this admission: 9/24 cefepime x 1 9/24 vanc  x 1 9/24 flagyl x 1 9/24 zosyn >>  Dose adjustments this admission:   Microbiology results: 9/24 BCx:  9/24 UCx:     Thank you for allowing pharmacy to be a part of this patient's care.  Dolly Rias RPh 03/31/2022, 11:59 PM

## 2022-03-29 NOTE — Anesthesia Procedure Notes (Signed)
Procedure Name: Intubation Date/Time: 03/26/2022 8:41 PM  Performed by: Raenette Rover, CRNAPre-anesthesia Checklist: Patient identified, Emergency Drugs available, Suction available and Patient being monitored Patient Re-evaluated:Patient Re-evaluated prior to induction Oxygen Delivery Method: Circle system utilized Preoxygenation: Pre-oxygenation with 100% oxygen Induction Type: IV induction and Rapid sequence Ventilation: Mask ventilation without difficulty Laryngoscope Size: Mac and 3 Grade View: Grade I Tube type: Oral Tube size: 7.0 mm Number of attempts: 1 Airway Equipment and Method: Stylet Placement Confirmation: ETT inserted through vocal cords under direct vision, positive ETCO2 and breath sounds checked- equal and bilateral Secured at: 20 cm Tube secured with: Tape Dental Injury: Teeth and Oropharynx as per pre-operative assessment

## 2022-03-29 NOTE — ED Provider Triage Note (Signed)
Emergency Medicine Provider Triage Evaluation Note  Helen Hardy , a 71 y.o. female  was evaluated in triage.  Pt complains of somewhat sudden onset of abdominal pain with N/V/D earlier this morning, unsure of the time exactly.  Pain waxes and wanes.  Described as sharp.  No blood visualized in vomit or stool.  Last known well was last night before she went to bed.  Denies chest pain, shortness of breath, headache, dizziness, or fevers.  Unable to keep down food and fluids at this time.  Hx of intermittent constipation, GERD, and prior GI bleed  Review of Systems  Positive:  Negative: See above  Physical Exam  BP 107/79 (BP Location: Right Arm)   Pulse 97   Temp 98.6 F (37 C) (Oral)   Resp 18   SpO2 98%  Gen:   Awake, appears uncomfortable Resp:  Normal effort, CTAB MSK:   Moves extremities without difficulty  Other:  Abdomen soft, diffuse tenderness.  Radial and PT pulses 2+ bilaterally.  Medical Decision Making  Medically screening exam initiated at 2:10 PM.  Appropriate orders placed.  Maudell Hardy was informed that the remainder of the evaluation will be completed by another provider, this initial triage assessment does not replace that evaluation, and the importance of remaining in the ED until their evaluation is complete.     Prince Rome, PA-C 53/64/68 1417

## 2022-03-29 NOTE — Anesthesia Procedure Notes (Signed)
Arterial Line Insertion Start/End09/16/2023 8:22 PM, 03/23/2022 8:34 PM Performed by: Pervis Hocking, DO, anesthesiologist  Patient location: Pre-op. Preanesthetic checklist: patient identified, IV checked, site marked, risks and benefits discussed, surgical consent, monitors and equipment checked, pre-op evaluation, timeout performed and anesthesia consent Emergency situation Lidocaine 1% used for infiltration radial was placed Catheter size: 20 G Hand hygiene performed  and maximum sterile barriers used   Attempts: 1 Procedure performed using ultrasound guided technique. Ultrasound Notes:anatomy identified, needle tip was noted to be adjacent to the nerve/plexus identified, no ultrasound evidence of intravascular and/or intraneural injection and image(s) printed for medical record Following insertion, dressing applied and Biopatch. Post procedure assessment: normal and unchanged  Post procedure complications: unsuccessful attempts. Patient tolerated the procedure well with no immediate complications. Additional procedure comments: Very small targets B/L radial arteries, unable to thread guidewire. Successful on R brachial..

## 2022-03-29 NOTE — Anesthesia Postprocedure Evaluation (Signed)
Anesthesia Post Note  Patient: Mariaha Porter-Jones  Procedure(s) Performed: EXPLORATORY LAPAROTOMY, REPAIR OF BLADDER, WOUND VAC PLACEMENT     Patient location during evaluation: ICU Anesthesia Type: General Level of consciousness: patient remains intubated per anesthesia plan Pain management: pain level controlled Vital Signs Assessment: vitals unstable Respiratory status: patient remains intubated per anesthesia plan and patient on ventilator - see flowsheet for VS Cardiovascular status: stable and tachycardic Postop Assessment: no apparent nausea or vomiting Anesthetic complications: no Comments: Called RT to help with transport as patient was still saturating poorly on PEEP 12, transported on ventilator with their assistance. BP unstable during case, but able to titrate pressors down during transport. Most recent pH 7.14, actively giving HCO3 and titrating pressors during transport to ICU. Full report to RN and bedside team, Dr. Donne Hazel at bedside who has given full report to ICU attending. Still difficult to obtain reliable pulse oximeter, but last arterial PO2 88 in OR- hypoxemia likely 2/2 aspiration of abdominal contents while vomiting over the previous hours.    No notable events documented.  Last Vitals:  Vitals:   04/02/2022 2015 03/26/2022 2016  BP: (!) 46/37 (!) 76/51  Pulse:    Resp: (!) 28 (!) 33  Temp:    SpO2:  (!) 15%    Last Pain:  Vitals:   03/28/2022 1743  TempSrc: Oral  PainSc: [place]

## 2022-03-29 NOTE — Anesthesia Preprocedure Evaluation (Signed)
Anesthesia Evaluation  Patient identified by MRN, date of birth, ID band Patient confused    Reviewed: Allergy & Precautions, NPO status , Patient's Chart, lab work & pertinent test resultsPreop documentation limited or incomplete due to emergent nature of procedure.  Airway Mallampati: III  TM Distance: >3 FB Neck ROM: Full    Dental  (+) Edentulous Upper   Pulmonary neg pulmonary ROS,    + rhonchi   unstable     Cardiovascular hypertension, Pt. on medications  Rhythm:Regular Rate:Tachycardia     Neuro/Psych negative neurological ROS  negative psych ROS   GI/Hepatic Neg liver ROS, GERD  Medicated and Controlled,perf bowel, free air- emergency laparotomy  Hx ischemic colitis, diverticulosis in past   Endo/Other  negative endocrine ROS  Renal/GU CRFRenal diseaseckd3  negative genitourinary   Musculoskeletal  (+) Arthritis , Rheumatoid disorders,    Abdominal (+) + obese,   Peds negative pediatric ROS (+)  Hematology negative hematology ROS (+)   Anesthesia Other Findings   Reproductive/Obstetrics negative OB ROS                             Anesthesia Physical Anesthesia Plan  ASA: 5 and emergent  Anesthesia Plan: General   Post-op Pain Management:    Induction: Intravenous  PONV Risk Score and Plan:   Airway Management Planned: Oral ETT  Additional Equipment: Arterial line and CVP  Intra-op Plan:   Post-operative Plan: Post-operative intubation/ventilation  Informed Consent: I have reviewed the patients History and Physical, chart, labs and discussed the procedure including the risks, benefits and alternatives for the proposed anesthesia with the patient or authorized representative who has indicated his/her understanding and acceptance.       Plan Discussed with: CRNA  Anesthesia Plan Comments: (Hemodynamically unstable on arrival to preop- BP cuff reading 50/30, BP  150s-160s sinus tachycardia, unable to get pulse oximeter to read anywhere, barely mentating- brought patient back to OR myself while room was being prepared. I attempted to reach family on phone numbers in chart, but unable to reach anyone. Pt was able to tell me that she would like everything done to resuscitate her, no hx DNR, no objections to blood transfusions.   Will place pre-induction arterial line as well as central line. Likely postoperative intubation/ventilation and ICU care)        Anesthesia Quick Evaluation

## 2022-03-29 NOTE — ED Provider Notes (Signed)
[facility] DEPT Provider Note   CSN: [id number] Arrival date & time: 03/31/2022  1347     History  Chief Complaint  Patient presents with   Abdominal Pain   Nausea   Emesis   Diarrhea    Helen Hardy is a 71 y.o. female with Hx of rheumatoid arthritis, GERD, diverticulitis, constipation, essential HTN, prior GI bleed and IDA.  Presenting to the ED with sudden onset of severe abdominal pain with nausea, vomiting, and a few episodes of diarrhea earlier this morning around 10 AM.  Abdominal pain comes in waves.  Denies blood in vomit or stool, or black tarry stools.  Denies recent fevers, chills, neck stiffness, or urinary symptoms.  States this is worse than her prior diverticulitis pain/incidents.  Endorsing difficulty keeping down food or fluids.  Denies chest pain, shortness of breath, headache, dizziness, fevers, or upper/lower extremity weakness.    The history is provided by the patient and medical records.     Home Medications Prior to Admission medications   Medication Sig Start Date End Date Taking? Authorizing Provider  acetaminophen (TYLENOL) 500 MG tablet Take 2 tablets (1,000 mg total) by mouth every 8 (eight) hours. 12/17/20   Irving Copas, PA-C  Ascorbic Acid (VITAMIN C) 1000 MG tablet Take 1,000 mg by mouth in the morning.    [provider]  cholecalciferol (VITAMIN D) 25 MCG (1000 UNIT) tablet Take 1,000 Units by mouth in the morning.    [provider]  docusate sodium (COLACE) 100 MG capsule Take 1 capsule (100 mg total) by mouth 2 (two) times daily. Patient taking differently: Take 100 mg by mouth in the morning. 12/17/20   Irving Copas, PA-C  estradiol (ESTRACE) 1 MG tablet Take 1 mg by mouth in the morning. 12/25/12   [provider]  folic acid (FOLVITE) 1 MG tablet Take 2 mg by mouth in the morning.    [provider]  furosemide (LASIX) 40 MG tablet Take 40 mg by mouth in the morning.  04/22/20   [provider]  gabapentin (NEURONTIN) 300 MG capsule Take 300-600 mg by mouth See admin instructions. Take 1 capsule (300 mg) by mouth in the morning, take 1 capsule (300 mg) by mouth in the afternoon & take 2 capsules (600 mg) by mouth at night. 03/17/18   [provider]  leflunomide (ARAVA) 20 MG tablet Take 1 tablet (20 mg total) by mouth in the morning. Hold for two weeks after surgery 04/17/21   Irving Copas, PA-C  methocarbamol (ROBAXIN) 500 MG tablet Take 1 tablet (500 mg total) by mouth every 6 (six) hours as needed for muscle spasms. 04/17/21   Irving Copas, PA-C  Multiple Vitamin (MULTIVITAMIN WITH MINERALS) TABS tablet Take 1 tablet by mouth in the morning.    [provider]  olmesartan (BENICAR) 20 MG tablet Take 20 mg by mouth in the morning. 12/27/18   [provider]  ondansetron (ZOFRAN) 4 MG tablet Take 4 mg by mouth every 8 (eight) hours as needed for nausea or vomiting.  04/10/19   [provider]  ondansetron (ZOFRAN-ODT) 4 MG disintegrating tablet 66m ODT q4 hours prn nausea/vomit 06/17/21   ZMilton Ferguson MD  ORENCIA 250 MG injection every 30 (thirty) days. 09/02/21   [provider]  Oxycodone HCl 10 MG TABS Take 10 mg by mouth every 6 (six) hours. 11/13/21   [provider]  pantoprazole (PROTONIX) 40 MG tablet Take 40  mg by mouth in the morning.    [provider]  polyethylene glycol (MIRALAX / GLYCOLAX) 17 g packet Take 17 g by mouth daily as needed for mild constipation. 12/17/20   Irving Copas, PA-C  Potassium Chloride ER 20 MEQ TBCR Take 20 mEq by mouth in the morning. 05/09/20   [provider]  predniSONE (DELTASONE) 10 MG tablet Take 10 mg by mouth daily with breakfast. 04/12/18   [provider]  psyllium (METAMUCIL) 0.52 g capsule Take 1 capsule (0.52 g total) by mouth daily. 06/17/21   Milton Ferguson, MD  tiZANidine (ZANAFLEX) 4 MG tablet tizanidine 4 mg  tablet  Take 1 tablet every day by oral route at bedtime.    [provider]  vitamin B-12 (CYANOCOBALAMIN) 1000 MCG tablet Take 1,000 mcg by mouth in the morning.    [provider]      Allergies    Oxycodone and Tramadol    Review of Systems   Review of Systems  Gastrointestinal:  Positive for abdominal pain, nausea and vomiting.    Physical Exam Updated Vital Signs BP (!) 142/60   Pulse (!) 130   Temp (!) 102 F (38.9 C) (Oral)   Resp (!) 24   SpO2 98%  Physical Exam Vitals and nursing note reviewed.  Constitutional:      General: She is not in acute distress.    Appearance: She is well-developed. She is obese. She is ill-appearing. She is not toxic-appearing or diaphoretic.  HENT:     Head: Normocephalic and atraumatic.     Nose: Nose normal.     Mouth/Throat:     Mouth: Mucous membranes are dry.     Pharynx: Oropharynx is clear.  Eyes:     General: No scleral icterus.    Conjunctiva/sclera: Conjunctivae normal.  Neck:     Comments: Very supple on exam.  No torticollis or meningismus Cardiovascular:     Rate and Rhythm: Normal rate and regular rhythm.     Pulses: Normal pulses.     Heart sounds: No murmur heard.    Comments: 2+ radial, PT pulses bilaterally Pulmonary:     Effort: Pulmonary effort is normal. No respiratory distress.     Breath sounds: Normal breath sounds. No wheezing.     Comments: CTAB, able to communicate without difficulty, without increased respiratory effort Chest:     Chest wall: No tenderness.  Abdominal:     General: Abdomen is protuberant. Bowel sounds are increased.     Palpations: Abdomen is soft. There is no pulsatile mass.     Tenderness: There is abdominal tenderness. There is guarding. There is no right CVA tenderness or left CVA tenderness. Negative signs include Murphy's sign and McBurney's sign.    Musculoskeletal:        General: No swelling.     Cervical back: Neck supple. No rigidity.     Right lower  leg: No edema.     Left lower leg: No edema.  Skin:    General: Skin is warm and dry.     Capillary Refill: Capillary refill takes less than 2 seconds.     Coloration: Skin is not jaundiced or pale.  Neurological:     Mental Status: She is alert and oriented to person, place, and time.     GCS: GCS eye subscore is 4. GCS verbal subscore is 5. GCS motor subscore is 6.     Motor: No weakness (Upper and lower extremities appear  5/5 strength bilaterally).     Comments: At baseline mentation per significant other  Psychiatric:        Mood and Affect: Mood normal.     ED Results / Procedures / Treatments   Labs (all labs ordered are listed, but only abnormal results are displayed) Labs Reviewed  CBC WITH DIFFERENTIAL/PLATELET - Abnormal; Notable for the following components:      Result Value   RBC 3.57 (*)    MCV 112.0 (*)    MCH 34.7 (*)    RDW 16.6 (*)    nRBC 2.1 (*)    Neutro Abs 1.3 (*)    All other components within normal limits  COMPREHENSIVE METABOLIC PANEL - Abnormal; Notable for the following components:   CO2 21 (*)    Glucose, Bld 138 (*)    BUN 26 (*)    Creatinine, Ser 1.35 (*)    GFR, Estimated 42 (*)    All other components within normal limits  LACTIC ACID, PLASMA - Abnormal; Notable for the following components:   Lactic Acid, Venous 3.8 (*)    All other components within normal limits  PROTIME-INR - Abnormal; Notable for the following components:   Prothrombin Time 15.8 (*)    INR 1.3 (*)    All other components within normal limits  APTT - Abnormal; Notable for the following components:   aPTT 23 (*)    All other components within normal limits  BASIC METABOLIC PANEL - Abnormal; Notable for the following components:   Chloride 112 (*)    CO2 21 (*)    Glucose, Bld 117 (*)    BUN 26 (*)    Creatinine, Ser 1.35 (*)    Calcium 8.4 (*)    GFR, Estimated 42 (*)    All other components within normal limits  CBC WITH DIFFERENTIAL/PLATELET - Abnormal;  Notable for the following components:   WBC 3.1 (*)    RBC 3.44 (*)    Hemoglobin 11.9 (*)    MCV 112.8 (*)    MCH 34.6 (*)    RDW 16.7 (*)    nRBC 3.6 (*)    Neutro Abs 1.1 (*)    All other components within normal limits  TROPONIN I (HIGH SENSITIVITY) - Abnormal; Notable for the following components:   Troponin I (High Sensitivity) 20 (*)    All other components within normal limits  RESP PANEL BY RT-PCR (FLU A&B, COVID) ARPGX2  URINE CULTURE  CULTURE, BLOOD (ROUTINE X 2)  CULTURE, BLOOD (ROUTINE X 2)  LIPASE, BLOOD  URINALYSIS, ROUTINE W REFLEX MICROSCOPIC  LACTIC ACID, PLASMA  TROPONIN I (HIGH SENSITIVITY)    EKG EKG Interpretation  Date/Time:  Sunday March 29 2022 17:45:55 EDT Ventricular Rate:  129 PR Interval:  104 QRS Duration: 71 QT Interval:  316 QTC Calculation: 463 R Axis:   1 Text Interpretation: Sinus tachycardia Aberrant complex Probable left atrial enlargement Left ventricular hypertrophy Tall R wave in V2, consider RVH or PMI Nonspecific ST depression Anterolateral leads new compared to prior EKG Confirmed by Oneal Deputy [phone]) on 03/30/2022 5:54:10 PM  Radiology CT Angio Abd/Pel W and/or Wo Contrast  Result Date: 03/22/2022 CLINICAL DATA:  Acute onset of severe abdominal pain, nausea and vomiting, and diarrhea. Clinical suspicion for bowel ischemia. Previous diverticulitis. EXAM: CTA ABDOMEN AND PELVIS WITHOUT AND WITH CONTRAST TECHNIQUE: Multidetector CT imaging of the abdomen and pelvis was performed using the standard protocol during bolus administration of intravenous contrast. Multiplanar reconstructed images and  MIPs were obtained and reviewed to evaluate the vascular anatomy. RADIATION DOSE REDUCTION: This exam was performed according to the departmental dose-optimization program which includes automated exposure control, adjustment of the mA and/or kV according to patient size and/or use of iterative reconstruction technique. CONTRAST:  37m  OMNIPAQUE IOHEXOL 350 MG/ML SOLN COMPARISON:  06/17/2021 FINDINGS: VASCULAR Aorta: Normal caliber aorta without aneurysm, dissection, vasculitis or significant stenosis. Celiac: Patent without evidence of aneurysm, dissection, vasculitis or significant stenosis. SMA: Patent without evidence of aneurysm, dissection, vasculitis or significant stenosis. Renals: Both renal arteries are patent without evidence of aneurysm, dissection, vasculitis, fibromuscular dysplasia or significant stenosis. IMA: Patent. Inflow: Patent without evidence of aneurysm, dissection, vasculitis or significant stenosis. Proximal Outflow: Bilateral common femoral and visualized portions of the superficial and profunda femoral arteries are patent without evidence of aneurysm, dissection, vasculitis or significant stenosis. Veins: No obvious venous abnormality within the limitations of this arterial phase study. Review of the MIP images confirms the above findings. NON-VASCULAR Lower Chest: Bilateral lower lobe infiltrates. No evidence of pleural effusion. Hepatobiliary: No hepatic masses identified. Gallbladder is unremarkable. No evidence of biliary ductal dilatation. Pancreas:  No mass or inflammatory changes. Spleen: Within normal limits in size and appearance. Adrenals/Urinary Tract: No suspicious masses identified. No evidence of ureteral calculi or hydronephrosis. Stomach/Bowel: Moderate amount of free intraperitoneal air is seen, consistent with bowel perforation. Moderate sigmoid diverticulitis is seen with adjacent extraluminal air bubbles, consistent with perforation. Adjacent diverticular abscess containing gas and fluid is also seen measuring 4.2 x 4.1 cm. Vascular/Lymphatic: No pathologically enlarged lymph nodes. No acute vascular findings. Reproductive: Prior hysterectomy noted. Adnexal regions are unremarkable in appearance. Other:  Small left inguinal hernia is seen which contains only fat. Musculoskeletal:  No suspicious bone  lesions identified. IMPRESSION: No evidence of mesenteric ischemia or other acute vascular pathology. Perforated sigmoid diverticulitis, with moderate amount of free intraperitoneal air. 4.2 cm sigmoid diverticular abscess. Bilateral lower lobe infiltrates. Small left inguinal hernia, which contains only fat. Critical Value/emergent results were called by telephone at the time of interpretation on 03/16/2022 at 6:38 pm [facility] verbally acknowledged these results. Electronically Signed   By: JMarlaine HindM.D.   On: 03/22/2022 18:41   DG Chest Port 1 View  Result Date: 03/18/2022 CLINICAL DATA:  Questionable sepsis. EXAM: PORTABLE CHEST 1 VIEW COMPARISON:  Nov 08, 2020 FINDINGS: Tortuosity of the thoracic aorta. Cardiomediastinal silhouette is normal. Mediastinal contours appear intact. There is no evidence of lobar airspace consolidation, pleural effusion or pneumothorax. Low lung volumes. Bibasilar atelectasis versus peribronchial airspace consolidation. Osseous structures are without acute abnormality. Soft tissues are grossly normal. IMPRESSION: Low lung volumes with bibasilar atelectasis versus peribronchial airspace consolidation. Electronically Signed   By: DFidela SalisburyM.D.   On: 03/28/2022 18:15    Procedures .Critical Care  Performed by: CPrince Rome PA-C Authorized by: CPrince Rome PA-C   Critical care provider statement:    Critical care time (minutes):  50   Critical care was necessary to treat or prevent imminent or life-threatening deterioration of the following conditions:  Sepsis   Critical care was time spent personally by me on the following activities:  Development of treatment plan with patient or surrogate, discussions with consultants, evaluation of patient's response to treatment, examination of patient, ordering and review of laboratory studies, ordering and review of radiographic studies, ordering and performing treatments and  interventions, pulse oximetry, re-evaluation of patient's condition, review of old charts and obtaining  history from patient or surrogate   I assumed direction of critical care for this patient from another provider in my specialty: yes     Care discussed with: admitting provider   Comments:     Sepsis with multiple antibiotics, HR > 120, and lactate of 3.8     Medications Ordered in ED Medications  lactated ringers infusion ( Intravenous New Bag/Given 03/24/2022 1847)  metroNIDAZOLE (FLAGYL) IVPB 500 mg (500 mg Intravenous New Bag/Given 03/13/2022 1845)  vancomycin (VANCOCIN) IVPB 1000 mg/200 mL premix (has no administration in time range)  lactated ringers bolus 1,000 mL (1,000 mLs Intravenous New Bag/Given 03/26/2022 1850)    And  lactated ringers bolus 1,000 mL (has no administration in time range)    And  lactated ringers bolus 500 mL (has no administration in time range)  acetaminophen (TYLENOL) tablet 650 mg (650 mg Oral Given 03/12/2022 1851)  ondansetron (ZOFRAN-ODT) disintegrating tablet 4 mg (4 mg Oral Given 03/31/2022 1430)  oxyCODONE-acetaminophen (PERCOCET/ROXICET) 5-325 MG per tablet 1 tablet (1 tablet Oral Given 03/21/2022 1430)  sodium chloride 0.9 % bolus 1,000 mL (0 mLs Intravenous Stopped 03/31/2022 1851)  iohexol (OMNIPAQUE) 350 MG/ML injection 75 mL (75 mLs Intravenous Contrast Given 03/14/2022 1633)  fentaNYL (SUBLIMAZE) injection 25 mcg (25 mcg Intravenous Given 03/10/2022 1712)  ceFEPIme (MAXIPIME) 2 g in sodium chloride 0.9 % 100 mL IVPB (2 g Intravenous New Bag/Given 03/06/2022 1842)  ondansetron (ZOFRAN) injection 4 mg (4 mg Intravenous Given 03/08/2022 1836)  fentaNYL (SUBLIMAZE) injection 50 mcg (50 mcg Intravenous Given 03/22/2022 1851)    ED Course/ Medical Decision Making/ A&P Clinical Course as of 03/31/2022 1927  Sun Mar 29, 2022  1843 Called by Radiology regarding CTA abdomen -- findings of perforated sigmoid diverticulosis, with 5cm abscess, and excessive free air in abdomen.   Consultation placed for general surgery. [AC]    Clinical Course User Index [AC] Prince Rome, PA-C                           Medical Decision Making Amount and/or Complexity of Data Reviewed Labs: ordered. Radiology: ordered.  Risk Prescription drug management. Decision regarding hospitalization. Emergency major surgery.   71 y.o. female presents to the ED for concern of Abdominal Pain, Nausea, Emesis, and Diarrhea     This involves an extensive number of treatment options, and is a complaint that carries with it a high risk of complications and morbidity.  The emergent differential diagnosis prior to evaluation includes, but is not limited to: Diverticulitis, constipation, mesenteric ischemia, renal calculi, pyelonephritis, acute cystitis  This is not an exhaustive differential.   Past Medical History / Co-morbidities / Social History: Hx of rheumatoid arthritis, GERD, cecal diverticulitis, constipation, essential HTN, prior GI bleed and IDA Social Determinants of Health include: Elderly  Additional History:  Obtained by chart review.  Notably hx of cecal diverticulitis 12/25/2019  Lab Tests: I ordered, and personally interpreted labs.  The pertinent results include:   Creatinine 1.35, BUN 26, GFR 42, close to baseline Glucose 138 MCV 112.0, MCH 34.7, notable macrocytic anemia appears to be slowly progressing Lipase 23 Lactic acid 3.8 APTT 23 PT-INR 15.8/1.3 mild increase Troponin 20 Repeat BMP: without significant changes Repeat CBC w/diff: drop in Hgb from 12.4 to 11.9 Blood cultures, urine culture pending.  Imaging Studies: I ordered imaging studies including CTA abdomen/pelvis and CXR.   I independently visualized and interpreted imaging.  CXR without acute cardiopulmonary pathology.  CTA  abdomen/pelvis showed perforated bowel, sigmoid diverticulitis with 5cm abscess, and excessive free air in abdomen I agree with the radiologist interpretation.  Cardiac  Monitoring: The patient was maintained on a cardiac monitor.  I personally viewed and interpreted the cardiac monitored which showed an underlying rhythm of: NSR - sinus tachycardia  ED Course / Critical Interventions: Pt mildly ill-appearing on exam.  Nontoxic, nonseptic apperaing in NAD.  Afebrile.  Hemodynamically stable.  Presenting today with acute abdominal pain with N/V/D.  Sudden onset this morning after she had been awake for a few hours.  Pain described as intermittent, waxes and wanes at random.  Hx of cecal diverticulitis, however patient states this pain is worse.  Without hemoptysis, hematemesis, melena, or hematochezia.  No recent upper respiratory infection, fevers, cough, rhinorrhea.  Low initial suspicion for pneumonia.  Without chest pain, shortness of breath, radiation through to the back, lower extremity weakness, or unequal/diminished pulses of extremities.  Low initial suspicion for AAA dissection, ACS, or PE.  Without epigastric tenderness, radiation through to the back, jaundice, or elevated lipase, low initial suspicion for acute pancreatitis.  Given severity, quick onset, and presentation, suspicious for possible ischemic bowel vs bowel perforation vs SBO.  CTA abdomen/pelvis pending. 1540: Upon reevaluation, patient has become tachycardic and tachypneic, and is now febrile at 102.0.  Code sepsis initiated.  Managing pain in ED.  Pt receiving fluid bolus.  EKG also with questionable findings, possible evidence of ischemia, troponins added and repeat EKG plan for 1 hour.  Pt still without active chest pain.  Imaging complete, report pending. 1843: Called by Radiology regarding CTA -- findings of perforated bowel, sigmoid diverticulitis with 5cm abscess, and excessive free air in abdomen.  Consulted with general surgery, recommend admission.  Critical lactic acid of 3.8 acknowledged.    Disposition: Admission with emergency surgery  I discussed this case with my attending, Dr.  Ernesto Rutherford, who agreed with the proposed treatment course and cosigned this note including patient's presenting symptoms, physical exam, and planned diagnostics and interventions.  Attending physician stated agreement with plan or made changes to plan which were implemented.     This chart was dictated using voice recognition software.  Despite best efforts to proofread, errors can occur which can change the documentation meaning.         Final Clinical Impression(s) / ED Diagnoses Final diagnoses:  Sigmoid diverticulitis  Bowel perforation ([place])  Abscess of sigmoid colon due to diverticulitis    Rx / DC Orders ED Discharge Orders     None         Prince Rome, PA-C 48/01/65 2004    Kneller, [place], DO 04/03/2022 2226

## 2022-03-29 NOTE — Progress Notes (Signed)
eLink Physician-Brief Progress Note Patient Name: Helia Haese DOB: 04-22-1951 MRN: [id number]   Date of Service  03/26/2022  HPI/Events of Note  70/F with RA on chronic steroids, CKD, hypertension, presenting with acute onset diffuse abdominal pain. Pt found to have intraperitoneal free air, likely perforated diverticulitis.    She is s/p EL, with sigmoid colectomy, repair of bladder injury, possible fistula and placement of negative pressure dressing.   Pt admitted to the ICU intubated and on 2 pressors.    CT abdomen No evidence of mesenteric ischemia or other acute vascular pathology. Perforated sigmoid diverticulitis, with moderate amount of free intraperitoneal air. 4.2 cm sigmoid diverticular abscess. Bilateral lower lobe infiltrates. Small left inguinal hernia, which contains only fat.  BP 103/56, HR 150s, RR20,  O2 sats 92%   eICU Interventions  Add on fentanyl gtt and lower propofol gtt if able.  Continue levophed and vasopressin.  Continue antibiotics.  Start on stress dose hydrocortisone (as approved by surgeon per Dr. Chase Caller).  Continue IV fluids.  Monitor foley cath.  Check CXR for ETT and NGT placement.  Get ABG.  Start on PPI for GI prophylaxis.  NGT to low intermittent wall suction.  Heparin for DVT prophylaxis.     Intervention Category Evaluation Type: New Patient Evaluation  Elsie Lincoln 03/15/2022, 10:43 PM

## 2022-03-29 NOTE — Transfer of Care (Signed)
Immediate Anesthesia Transfer of Care Note  Patient: Helen Hardy  Procedure(s) Performed: EXPLORATORY LAPAROTOMY, REPAIR OF BLADDER, WOUND VAC PLACEMENT  Patient Location: ICU  Anesthesia Type:General  Level of Consciousness: sedated  Airway & Oxygen Therapy: Patient remains intubated per anesthesia plan and Patient placed on Ventilator (see vital sign flow sheet for setting)  Post-op Assessment: Report given to RN, Post -op Vital signs reviewed and stable and remains unable to obtain spo2  Post vital signs: Reviewed, stable and unstable--unable to obtain spo2  Last Vitals:  Vitals Value Taken Time  BP See ICU VS   Temp    Pulse See ICU VS   Resp 20 03/06/2022 2233  SpO2 100 % 03/07/2022 2220  Vitals shown include unvalidated device data.  Last Pain:  Vitals:   03/17/2022 1743  TempSrc: Oral  PainSc: 3          Complications: No notable events documented.

## 2022-03-29 NOTE — Op Note (Addendum)
Preoperative diagnosis: septic shock, pneumoperitoneum Postoperative diagnosis: perforated sigmoid colon with diffuse feculent peritonitis and septic shock Procedure: 1.  Exploratory laparotomy with sigmoid colectomy 2.  Repair of bladder injury, possible fistula 3.  Placement of negative pressure dressing Surgeon: Dr. Serita Grammes Assistant: Dr. Annye English Estimated blood loss: 50 cc Anesthesia: General Complications: Bladder injury which was likely from a pre-existing fistula repaired Will need to do an x-ray after surgery to ensure no sponges due to urgency of case Disposition ICU in critical condition  Indications: This is a 71 year old female with multiple medical problems including history of ischemic colitis who has rheumatoid arthritis on immunosuppression.  She had acute onset of diffuse abdominal pain this morning with nausea and vomiting.  She arrived to the emergency room and I was asked to see her once her CT scan returned.  She was tachycardic febrile and tachypneic when I saw her.  She had an elevated lactate.  Her CT scan showed pneumoperitoneum with fluid throughout her abdomen and likely perforated diverticulitis.  I discussed with her family urgently going to the operating room.  The operating room was very quick in getting her over.  During this time she dropped her blood pressure to 50/30.  Procedure: After informed consent was obtained she was taken to the operating room.  She was already given antibiotics.  SCDs were in place.  She had a right IJ central line and an arterial line placed by anesthesia.  She was then intubated without difficulty.  She remained on pressors through the entire case with oxygen saturations that were marginal at best.  It appeared that she had aspirated sometime during the day as well.  This was not done with induction of anesthesia but likely at home when she was vomiting.  A Foley catheter was placed.  She was then prepped and draped in the  standard sterile surgical fashion and a timeout was then performed.  I made a midline incision and carried this into the abdomen.  There was stool and purulence at return from all 4 quadrants of the abdomen.  This was opened in its entirety.  Her small bowel was very dusky with a lot of exudate but did appear to be viable.  It was clear that the source was in her sigmoid colon as there was stool visibly leaking from this.  This was difficult due to some scar tissue which is likely from her prior ischemic disease as well as her hysterectomy.  I then took down the white line of Toldt and started dividing the posterior attachments of the sigmoid colon down to the rectum to get below the perforation.  I divided the sigmoid colon proximally at a healthy position.  Anteriorly this was fused to her bladder which became apparent when we got in the bladder.  I think she likely had a fistula at this site given the findings.  We then were able to get below the perforation onto the rectum.  We used the contour stapler to divide this.  The sigmoid colon was then passed off the table as a specimen.  Copious irrigation was then performed.  I did run the small bowel again.  There were no injuries and this was viable but some of it certainly appeared marginal likely due to the high-dose pressors that she was on.  I then repaired the bladder with 2-0 Vicryl sutures after identifying the hole.  Due to the fact that she remained hypotensive on Neo-Synephrine, Levophed, and vasopressin we elected  to end the operation at that point.  We left her bowel in discontinuity and she was stapled off and her left colon.  There are no sponges left in her abdomen but all of them will need to be accounted for.  I then placed an abdominal VAC and secured this.  She will be transferred to the ICU in critical condition  I have discussed the case with the ICU physician.  I have also discussed with her family that there certainly is still a chance of  mortality from this disease.  Upon entering the abdomen (organ space), I encountered feculent peritonitis.  CASE DATA:  Type of patient?: LDOW CASE (Surgical Hospitalist [facility] Inpatient)  Status of Case? EMERGENT Add On  Infection Present At Time Of Surgery (PATOS)?  FECULENT PERITONITIS

## 2022-03-29 NOTE — ED Notes (Signed)
Patient transported to CT 

## 2022-03-30 ENCOUNTER — Other Ambulatory Visit: Payer: Self-pay

## 2022-03-30 ENCOUNTER — Inpatient Hospital Stay (HOSPITAL_COMMUNITY): Payer: Medicare Other

## 2022-03-30 ENCOUNTER — Encounter (HOSPITAL_COMMUNITY): Payer: Self-pay | Admitting: General Surgery

## 2022-03-30 DIAGNOSIS — R579 Shock, unspecified: Secondary | ICD-10-CM

## 2022-03-30 DIAGNOSIS — R0603 Acute respiratory distress: Secondary | ICD-10-CM

## 2022-03-30 DIAGNOSIS — J9601 Acute respiratory failure with hypoxia: Secondary | ICD-10-CM | POA: Diagnosis not present

## 2022-03-30 DIAGNOSIS — K631 Perforation of intestine (nontraumatic): Secondary | ICD-10-CM | POA: Diagnosis not present

## 2022-03-30 DIAGNOSIS — K668 Other specified disorders of peritoneum: Secondary | ICD-10-CM | POA: Diagnosis not present

## 2022-03-30 LAB — BLOOD CULTURE ID PANEL (REFLEXED) - BCID2

## 2022-03-30 LAB — CBC
HCT: 33.8 % — ABNORMAL LOW (ref 36.0–46.0)
Hemoglobin: 10.5 g/dL — ABNORMAL LOW (ref 12.0–15.0)
MCH: 34.9 pg — ABNORMAL HIGH (ref 26.0–34.0)
MCHC: 31.1 g/dL (ref 30.0–36.0)
MCV: 112.3 fL — ABNORMAL HIGH (ref 80.0–100.0)
Platelets: 142 10*3/uL — ABNORMAL LOW (ref 150–400)
RBC: 3.01 MIL/uL — ABNORMAL LOW (ref 3.87–5.11)
RDW: 17.1 % — ABNORMAL HIGH (ref 11.5–15.5)
WBC: 6.8 10*3/uL (ref 4.0–10.5)
nRBC: 4.7 % — ABNORMAL HIGH (ref 0.0–0.2)

## 2022-03-30 LAB — ECHOCARDIOGRAM COMPLETE
AR max vel: 2.75 cm2
AV Area VTI: 3.01 cm2
AV Area mean vel: 2.75 cm2
AV Mean grad: 2 mmHg
AV Peak grad: 4 mmHg
Ao pk vel: 1 m/s
Area-P 1/2: 4.96 cm2
Calc EF: 39.9 %
Height: 59 in
S' Lateral: 3.3 cm
Single Plane A2C EF: 26.4 %
Single Plane A4C EF: 40.8 %
Weight: 3633.18 oz

## 2022-03-30 LAB — BLOOD GAS, ARTERIAL
Acid-base deficit: 10.3 mmol/L — ABNORMAL HIGH (ref 0.0–2.0)
Acid-base deficit: 13.4 mmol/L — ABNORMAL HIGH (ref 0.0–2.0)
Bicarbonate: 17.2 mmol/L — ABNORMAL LOW (ref 20.0–28.0)
Bicarbonate: 13.1 mmol/L — ABNORMAL LOW (ref 20.0–28.0)
Drawn by: 59133
FIO2: 100 %
MECHVT: 340 mL
O2 Saturation: 97.8 %
O2 Saturation: 97.6 %
PEEP: 5 cmH2O
Patient temperature: 36
Patient temperature: 37
RATE: 20 resp/min
pCO2 arterial: 41 mmHg (ref 32–48)
pCO2 arterial: 32 mmHg (ref 32–48)
pH, Arterial: 7.22 — ABNORMAL LOW (ref 7.35–7.45)
pH, Arterial: 7.22 — ABNORMAL LOW (ref 7.35–7.45)
pO2, Arterial: 111 mmHg — ABNORMAL HIGH (ref 83–108)
pO2, Arterial: 115 mmHg — ABNORMAL HIGH (ref 83–108)

## 2022-03-30 LAB — COMPREHENSIVE METABOLIC PANEL
ALT: 23 U/L (ref 0–44)
AST: 39 U/L (ref 15–41)
Albumin: 2.2 g/dL — ABNORMAL LOW (ref 3.5–5.0)
Alkaline Phosphatase: 52 U/L (ref 38–126)
Anion gap: 9 (ref 5–15)
BUN: 26 mg/dL — ABNORMAL HIGH (ref 8–23)
CO2: 17 mmol/L — ABNORMAL LOW (ref 22–32)
Calcium: 7.9 mg/dL — ABNORMAL LOW (ref 8.9–10.3)
Chloride: 114 mmol/L — ABNORMAL HIGH (ref 98–111)
Creatinine, Ser: 1.83 mg/dL — ABNORMAL HIGH (ref 0.44–1.00)
GFR, Estimated: 29 mL/min — ABNORMAL LOW (ref >60)
Glucose, Bld: 167 mg/dL — ABNORMAL HIGH (ref 70–99)
Potassium: 4.4 mmol/L (ref 3.5–5.1)
Sodium: 140 mmol/L (ref 135–145)
Total Bilirubin: 0.7 mg/dL (ref 0.3–1.2)
Total Protein: 4.8 g/dL — ABNORMAL LOW (ref 6.5–8.1)

## 2022-03-30 LAB — HEMOGLOBIN A1C
Hgb A1c MFr Bld: 5.5 % (ref 4.8–5.6)
Mean Plasma Glucose: 111.15 mg/dL

## 2022-03-30 LAB — GLUCOSE, CAPILLARY
Glucose-Capillary: 100 mg/dL — ABNORMAL HIGH (ref 70–99)
Glucose-Capillary: 103 mg/dL — ABNORMAL HIGH (ref 70–99)
Glucose-Capillary: 106 mg/dL — ABNORMAL HIGH (ref 70–99)
Glucose-Capillary: 141 mg/dL — ABNORMAL HIGH (ref 70–99)
Glucose-Capillary: 161 mg/dL — ABNORMAL HIGH (ref 70–99)
Glucose-Capillary: 70 mg/dL (ref 70–99)

## 2022-03-30 LAB — TROPONIN I (HIGH SENSITIVITY)
Troponin I (High Sensitivity): 236 ng/L (ref <18)
Troponin I (High Sensitivity): 210 ng/L (ref <18)

## 2022-03-30 LAB — BASIC METABOLIC PANEL
Anion gap: 13 (ref 5–15)
BUN: 27 mg/dL — ABNORMAL HIGH (ref 8–23)
CO2: 13 mmol/L — ABNORMAL LOW (ref 22–32)
Calcium: 7.8 mg/dL — ABNORMAL LOW (ref 8.9–10.3)
Chloride: 112 mmol/L — ABNORMAL HIGH (ref 98–111)
Creatinine, Ser: 2.03 mg/dL — ABNORMAL HIGH (ref 0.44–1.00)
GFR, Estimated: 26 mL/min — ABNORMAL LOW (ref >60)
Glucose, Bld: 162 mg/dL — ABNORMAL HIGH (ref 70–99)
Potassium: 4.7 mmol/L (ref 3.5–5.1)
Sodium: 138 mmol/L (ref 135–145)

## 2022-03-30 LAB — LACTIC ACID, PLASMA
Lactic Acid, Venous: 3.4 mmol/L (ref 0.5–1.9)
Lactic Acid, Venous: 3.9 mmol/L (ref 0.5–1.9)

## 2022-03-30 LAB — MRSA NEXT GEN BY PCR, NASAL: MRSA by PCR Next Gen: DETECTED — AB

## 2022-03-30 MED ORDER — POLYETHYLENE GLYCOL 3350 17 G PO PACK: 17.0000 g | PACK | Freq: Every day | ORAL | Status: DC

## 2022-03-30 MED ORDER — MIDAZOLAM HCL 2 MG/2ML IJ SOLN
2.0000 mg | INTRAMUSCULAR | Status: DC | PRN
  Administered 2022-03-31: 2 mg via INTRAVENOUS
  Filled 2022-03-30 (×2): qty 2

## 2022-03-30 MED ORDER — CHLORHEXIDINE GLUCONATE CLOTH 2 % EX PADS
6.0000 | MEDICATED_PAD | Freq: Every day | CUTANEOUS | Status: DC
  Administered 2022-03-30 – 2022-04-06 (×8): 6 via TOPICAL

## 2022-03-30 MED ORDER — DOCUSATE SODIUM 50 MG/5ML PO LIQD
100.0000 mg | Freq: Two times a day (BID) | ORAL | Status: DC
  Administered 2022-04-02 – 2022-04-06 (×7): 100 mg
  Filled 2022-03-30 (×8): qty 10

## 2022-03-30 MED ORDER — MIDAZOLAM HCL 2 MG/2ML IJ SOLN
1.0000 mg | INTRAMUSCULAR | Status: DC | PRN
  Administered 2022-03-30: 4 mg via INTRAVENOUS
  Filled 2022-03-30: qty 4

## 2022-03-30 MED ORDER — MIDAZOLAM-SODIUM CHLORIDE 100-0.9 MG/100ML-% IV SOLN
0.5000 mg/h | INTRAVENOUS | Status: DC
  Administered 2022-03-30: 0.5 mg/h via INTRAVENOUS
  Filled 2022-03-30: qty 100

## 2022-03-30 MED ORDER — NOREPINEPHRINE 16 MG/250ML-% IV SOLN
0.0000 ug/min | INTRAVENOUS | Status: DC
  Administered 2022-03-30: 6 ug/min via INTRAVENOUS
  Administered 2022-03-30: 32 ug/min via INTRAVENOUS
  Administered 2022-03-31: 0.5 ug/min via INTRAVENOUS
  Administered 2022-04-01: 3 ug/min via INTRAVENOUS
  Filled 2022-03-30 (×3): qty 250

## 2022-03-30 MED ORDER — DEXMEDETOMIDINE HCL IN NACL 200 MCG/50ML IV SOLN
0.4000 ug/kg/h | INTRAVENOUS | Status: DC
  Administered 2022-03-30 – 2022-03-31 (×6): 0.4 ug/kg/h via INTRAVENOUS
  Administered 2022-03-31: 0.8 ug/kg/h via INTRAVENOUS
  Administered 2022-03-31: 0.4 ug/kg/h via INTRAVENOUS
  Administered 2022-03-31 (×4): 0.8 ug/kg/h via INTRAVENOUS
  Administered 2022-03-31: 0.7 ug/kg/h via INTRAVENOUS
  Administered 2022-04-01 (×3): 1 ug/kg/h via INTRAVENOUS
  Filled 2022-03-30 (×2): qty 50
  Filled 2022-03-30: qty 100
  Filled 2022-03-30: qty 50
  Filled 2022-03-30: qty 100
  Filled 2022-03-30: qty 50
  Filled 2022-03-30: qty 100
  Filled 2022-03-30 (×6): qty 50

## 2022-03-30 MED ORDER — INSULIN ASPART 100 UNIT/ML IJ SOLN
0.0000 [IU] | INTRAMUSCULAR | Status: DC
  Administered 2022-03-30: 3 [IU] via SUBCUTANEOUS
  Administered 2022-03-30: 4 [IU] via SUBCUTANEOUS
  Administered 2022-03-31 (×2): 3 [IU] via SUBCUTANEOUS
  Administered 2022-04-01 (×2): 4 [IU] via SUBCUTANEOUS
  Administered 2022-04-01 – 2022-04-03 (×9): 3 [IU] via SUBCUTANEOUS
  Administered 2022-04-03 (×3): 4 [IU] via SUBCUTANEOUS
  Administered 2022-04-03: 3 [IU] via SUBCUTANEOUS
  Administered 2022-04-03 – 2022-04-04 (×3): 4 [IU] via SUBCUTANEOUS
  Administered 2022-04-04: 3 [IU] via SUBCUTANEOUS
  Administered 2022-04-04 – 2022-04-05 (×7): 4 [IU] via SUBCUTANEOUS
  Administered 2022-04-05: 7 [IU] via SUBCUTANEOUS
  Administered 2022-04-05: 4 [IU] via SUBCUTANEOUS
  Administered 2022-04-06: 7 [IU] via SUBCUTANEOUS
  Administered 2022-04-06 (×2): 4 [IU] via SUBCUTANEOUS
  Administered 2022-04-06: 7 [IU] via SUBCUTANEOUS

## 2022-03-30 MED ORDER — SODIUM CHLORIDE 0.9 % IV SOLN
100.0000 mg | INTRAVENOUS | Status: DC
  Administered 2022-03-30 – 2022-04-03 (×5): 100 mg via INTRAVENOUS
  Filled 2022-03-30 (×5): qty 5

## 2022-03-30 MED ORDER — HEPARIN SOD (PORK) LOCK FLUSH 10 UNIT/ML IV SOLN
10.0000 [IU] | Freq: Once | INTRAVENOUS | Status: DC
  Filled 2022-03-30: qty 1

## 2022-03-30 MED ORDER — FENTANYL CITRATE PF 50 MCG/ML IJ SOSY
25.0000 ug | PREFILLED_SYRINGE | INTRAMUSCULAR | Status: DC | PRN
  Administered 2022-03-30: 50 ug via INTRAVENOUS
  Administered 2022-03-30: 25 ug via INTRAVENOUS
  Administered 2022-03-30: 50 ug via INTRAVENOUS
  Administered 2022-04-02 – 2022-04-03 (×2): 100 ug via INTRAVENOUS

## 2022-03-30 MED ORDER — MUPIROCIN 2 % EX OINT
1.0000 | TOPICAL_OINTMENT | Freq: Two times a day (BID) | CUTANEOUS | Status: AC
  Administered 2022-03-30 – 2022-04-03 (×10): 1 via NASAL
  Filled 2022-03-30: qty 22

## 2022-03-30 MED ORDER — INFLUENZA VAC A&B SA ADJ QUAD 0.5 ML IM PRSY
0.5000 mL | PREFILLED_SYRINGE | INTRAMUSCULAR | Status: DC
  Filled 2022-03-30: qty 0.5

## 2022-03-30 MED ORDER — MIDAZOLAM HCL 2 MG/2ML IJ SOLN
2.0000 mg | INTRAMUSCULAR | Status: DC | PRN
  Administered 2022-03-30 – 2022-04-02 (×8): 2 mg via INTRAVENOUS
  Filled 2022-03-30 (×7): qty 2

## 2022-03-30 MED ORDER — FENTANYL CITRATE PF 50 MCG/ML IJ SOSY: 25.0000 ug | PREFILLED_SYRINGE | INTRAMUSCULAR | Status: DC | PRN

## 2022-03-30 MED ORDER — ORAL CARE MOUTH RINSE
15.0000 mL | OROMUCOSAL | Status: DC
  Administered 2022-03-30 – 2022-04-07 (×98): 15 mL via OROMUCOSAL

## 2022-03-30 MED ORDER — HYDROCORTISONE SOD SUC (PF) 100 MG IJ SOLR
100.0000 mg | Freq: Three times a day (TID) | INTRAMUSCULAR | Status: DC
  Administered 2022-03-30 – 2022-04-03 (×13): 100 mg via INTRAVENOUS
  Filled 2022-03-30 (×13): qty 2

## 2022-03-30 MED ORDER — ALBUMIN HUMAN 25 % IV SOLN
50.0000 g | Freq: Once | INTRAVENOUS | Status: AC
  Administered 2022-03-30: 50 g via INTRAVENOUS
  Filled 2022-03-30: qty 200

## 2022-03-30 MED ORDER — PNEUMOCOCCAL 20-VAL CONJ VACC 0.5 ML IM SUSY
0.5000 mL | PREFILLED_SYRINGE | INTRAMUSCULAR | Status: DC
  Filled 2022-03-30: qty 0.5

## 2022-03-30 MED ORDER — ORAL CARE MOUTH RINSE: 15.0000 mL | OROMUCOSAL | Status: DC | PRN

## 2022-03-30 NOTE — Consult Note (Signed)
NAMELeyla Hardy, MRN:  [id number], DOB:  [birth year], LOS: 1 ADMISSION DATE:  03/15/2022, CONSULTATION DATE:  03/30/22 REFERRING MD:  Dr Serita Grammes of [facility], CHIEF COMPLAINT:  Circulatory shock   History of Present Illness:  71 year old female immunosuppressed because of rheumatoid arthritis on chronic steroids and?  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  Developed septic shock prior to the surgery.  Taken emergently to surgery by Dr. Donne Hazel for exploratory laparotomy with sigmoid colectomy, repair of bladder injury with possible fistula and placement of negative pressure dressing.  Status post intubation for septic shock acute respiratory failure and surgical needs in the operating room along with right IJ line placement and arterial line by anesthesia service.  Postoperatively return to the intensive care unit in septic shock requiring Levophed 15 mcg, based placed in shock dose fentanyl infusion, propofol infusion and with Foley catheter.  Family at the bedside.  Past Medical History:    has a past medical history of Anemia, Anemia of chronic renal failure, stage 3 (moderate) ([place]) (12/30/2018), GERD (gastroesophageal reflux disease), History of ischemic colitis, Hypertension, Malabsorption of iron (06/21/2015), Non Hodgkin's lymphoma ([place]), Numbness and tingling of right leg, Pneumonia, and RA (rheumatoid arthritis) ([place]).   reports that she has never smoked. She has never used smokeless tobacco.  Past Surgical History:  Procedure Laterality Date   2012 Right big toe and toe beside - had pins in toes     ABDOMINAL HYSTERECTOMY     BIOPSY  04/01/2020   Procedure: BIOPSY;  Surgeon: Juanita Craver, MD;  Location: [facility];  Service:  Gastroenterology;;   bladder tack - 2005     COLONOSCOPY WITH PROPOFOL N/A 04/01/2020   Procedure: COLONOSCOPY WITH PROPOFOL;  Surgeon: Juanita Craver, MD;  Location: [facility];  Service: Gastroenterology;  Laterality: N/A;   EYE SURGERY Bilateral    cataract   I & D KNEE WITH POLY EXCHANGE Right 04/14/2021   Procedure: IRRIGATION AND DEBRIDEMENT KNEE WITH POLY EXCHANGE;  Surgeon: Paralee Cancel, MD;  Location: [facility];  Service: Orthopedics;  Laterality: Right;   JOINT REPLACEMENT Bilateral 1995, 1996   KNEE   KNEE REPAIR EXTENSOR MECHANISM Right 04/14/2021   Procedure: RIGHT KNEE EXTENSOR MECHANISM REPAIR;  Surgeon: Paralee Cancel, MD;  Location: [facility];  Service: Orthopedics;  Laterality: Right;   LUMBAR LAMINECTOMY/DECOMPRESSION MICRODISCECTOMY N/A 03/04/2016   Procedure: RIGHT FAR LATERAL DISCECTOMY L5-S1;  Surgeon: Melina Schools, MD;  Location: [facility];  Service: Orthopedics;  Laterality: N/A;   PORTACATH PLACEMENT     PORTACATH REMOVAL     ROTATOR CUFF REPAIR Left    TOTAL KNEE REVISION Left 04/08/2015   Procedure: REVISION LEFT TOTAL KNEE ;  Surgeon: Paralee Cancel, MD;  Location: [facility];  Service: Orthopedics;  Laterality: Left;   TOTAL KNEE REVISION Right 12/12/2020   Procedure: TOTAL KNEE REVISION;  Surgeon: Paralee Cancel, MD;  Location: [facility];  Service: Orthopedics;  Laterality: Right;    Allergies  Allergen Reactions   Oxycodone Nausea And Vomiting    Able tolerate with antinausea medicine, Pt is currently taking   Tramadol Other (See Comments)    UNKNOWN REACTION    Immunization History  Administered Date(s) Administered   Influenza,inj,quad, With Preservative 04/05/2017   Influenza-Unspecified 03/12/2015   PFIZER Comirnaty(Gray Top)Covid-19 Tri-Sucrose Vaccine  05/05/2020, 10/15/2020   PFIZER(Purple Top)SARS-COV-2 Vaccination 08/19/2019, 09/11/2019   Pneumococcal Polysaccharide-23 04/30/2000, 08/07/2015   Zoster Recombinat (Shingrix) 09/06/2018, 12/22/2018    Family  History  Problem Relation Age of Onset   Breast cancer Neg Hx      Current Facility-Administered Medications:    0.9 %  sodium chloride infusion, , Intravenous, Continuous, Rolm Bookbinder, MD, Last Rate: 125 mL/hr at 03/30/22 0027, Infusion Verify at 03/30/22 0027   Place/Maintain arterial line, , , Until Discontinued **AND** 0.9 %  sodium chloride infusion, , Intra-arterial, PRN, Rolm Bookbinder, MD   acetaminophen (OFIRMEV) IV 1,000 mg, 1,000 mg, Intravenous, Q6H, Rolm Bookbinder, MD, Stopped at 04/04/2022 2343   fentaNYL (SUBLIMAZE) bolus via infusion 25-100 mcg, 25-100 mcg, Intravenous, Q15 min PRN, Rolm Bookbinder, MD   fentaNYL 252mg in NS 2571m(1032mml) infusion-PREMIX, 25-200 mcg/hr, Intravenous, Continuous, WakRolm BookbinderD, Last Rate: 5 mL/hr at 03/13/2022 2321, 50 mcg/hr at 04/04/2022 2321   heparin injection 5,000 Units, 5,000 Units, Subcutaneous, Q8H, WakRolm BookbinderD   hydrocortisone sodium succinate (SOLU-CORTEF) 100 MG injection 100 mg, 100 mg, Intravenous, Q8H, WakRolm BookbinderD, 100 mg at 03/31/2022 2300   [DISCONTINUED] lactated ringers bolus 1,000 mL, 1,000 mL, Intravenous, Once **AND** [COMPLETED] lactated ringers bolus 1,000 mL, 1,000 mL, Intravenous, Once, Stopped at 04/04/2022 1936 **AND** [COMPLETED] lactated ringers bolus 1,000 mL, 1,000 mL, Intravenous, Once, Stopping previously hung infusion at 03/09/2022 2330 **AND** lactated ringers bolus 500 mL, 500 mL, Intravenous, Once, WakRolm BookbinderD   methocarbamol (ROBAXIN) tablet 500 mg, 500 mg, Per Tube, Q8H PRN **OR** methocarbamol (ROBAXIN) 500 mg in dextrose 5 % 50 mL IVPB, 500 mg, Intravenous, Q8H PRN, WakRolm BookbinderD   norepinephrine (LEVOPHED) 4mg7m 250mL21m016 mg/mL) premix infusion, 0-40 mcg/min, Intravenous, Titrated, WakefRolm Bookbinder Last Rate: 56.3 mL/hr at 03/30/22 0027, 15 mcg/min at 03/30/22 0027   ondansetron (ZOFRAN-ODT) disintegrating tablet 4 mg, 4 mg, Oral, Q6H PRN  **OR** ondansetron (ZOFRAN) injection 4 mg, 4 mg, Intravenous, Q6H PRN, WakefRolm Bookbinder  pantoprazole (PROTONIX) injection 40 mg, 40 mg, Intravenous, Q24H, WakefRolm Bookbinder 40 mg at 03/23/2022 2259   piperacillin-tazobactam (ZOSYN) IVPB 3.375 g, 3.375 g, Intravenous, Q8H, WakefRolm Bookbinder Last Rate: 12.5 mL/hr at 03/30/22 0027, Infusion Verify at 03/30/22 0027   propofol (DIPRIVAN) 1000 MG/100ML infusion, 5-80 mcg/kg/min, Intravenous, Titrated, WakefRolm Bookbinder Last Rate: 9 mL/hr at 03/30/22 0027, 15 mcg/kg/min at 03/30/22 0027   vasopressin (PITRESSIN) 20 Units in sodium chloride 0.9 % 100 mL infusion-*FOR SHOCK*, 0-0.03 Units/min, Intravenous, Continuous, WakefRolm Bookbinder Last Rate: 9 mL/hr at 03/30/22 0027, 0.03 Units/min at 03/30/22 0027     Significant Hospital Events:  03/15/2022 - admit.  Emergency laparotomy   Interim History / Subjective:   03/30/2022 - seen in bed [address] ICU.  On ventilator 100% FiO2, Levophed 15 mcg, vasopressin, Foley catheter Central line and arterial line present  Objective   Blood pressure 109/67, pulse (!) 152, temperature 98.4 F (36.9 C), resp. rate 20, height 4' 11"  (1.499 m), weight 103 kg, SpO2 95 %.    Vent Mode: PRVC FiO2 (%):  [100 %] 100 % Set Rate:  [20 bmp] 20 bmp Vt Set:  [340 mL] 340 mL PEEP:  [5 cmH20] 5 cmH20 Plateau Pressure:  [14 cmH20-16 cmH20] 16 cmH20   Intake/Output Summary (Last 24 hours) at 03/30/2022 0041 Last data filed at 03/30/2022 0027 Gross per 24 hour  Intake 7172.97 ml  Output 100 ml  Net 7072.97  ml   Filed Weights   03/10/2022 2212  Weight: 103 kg    Examination: General: Deeply sedated female on the ventilator looks very critically ill HENT: Intubated Lungs: Synchronous with the ventilator Cardiovascular: Tachycardic 130 but improved from 160 after fluids and pain control according to nurse Abdomen: Soft wound VAC present surgical incision Extremities: No cyanosis no  clubbing no edema Neuro: RASS -4 on fentanyl infusion and propofol infusion GU: Foley catheter with some blood  Assessment & Plan:     Acute respiratory failure secondary to septic shock secondary to diverticulitis. - Present on Admit.   Intubated preoperatively  03/30/2022 -> return to the ICU on the ventilator 100% FiO2 and PEEP of 5.  Pulse ox 97%  P:   Full ventilator support PRVC VAP bundle Adequate sedation to avoid wound dehiscence of the abdomen Pressure support weaning only after talking to surgery   :   Chronic pain on opioids oxycodone gabapentin, Zanaflex and Robaxin - Prior to & Present on Admit Abdominal pain secondary to diverticulitis rupture and postoperative incisional pain - Present on Admit Sedation needs on the ventilator  - 03/30/2022 RASS -4 on fentanyl and propofol  P:   Fentanyl infusion Stopped propofol infusion Fentanyl as needed Versed as needed Robaxin as needed RASS sedation score -4 for the moment     History of hypertension on Benicar - Prior to & Present on Admit Septic shock -due to peritonitis diverticulitis rupture - Present on Admit   - 03/30/2022: On Levophed 15 mcg and vasopressin 0.03  P:  Vasopressors for MAP greater than 65 Get echocardiogram Fluid resuscitation Stress dose steroids hydrocortisone    Severe sinus tachycardia due to shock and postoperative pain- Present on Admit  03/30/2022 heart rate 130s but improved from 160  P: Telemetry monitoring   Immunosuppressed on leflunomide, [place]ren[place] and chronic steroids - Prior to & Present on Admit Severe sepsis septic shock secondary to peritonitis due to diverticulitis rupture - Present on Admit   P:  Blood culture 03/25/2022>> Urine culture 03/31/2022>>   Antibiotics according to surgery 9/24 cefepime x 1 9/24 vanc  x 1 9/24 flagyl x 1 9/24 zosyn >> We will threshold to start antifungals depending on course   CKD with baseline creatinine 1.17 mg percent -  Prior to & Present on Admit Acute kidney injury creatinine 1.35 mg percent - Present on Admit Bladder injury intraoperatively with resultant hematuria- 03/26/2022   03/30/2022 -blood in Foley catheter  P:  Resuscitate Avoid nephrotoxins Monitor hematuria  Metabolic acidosis with lactic acidosis - due to sepsis; -Present on Admit   03/30/2022 -lactate 3.9  P Monitor for clearance with resuscitation of sepsis*    At risk electrolyte imbalance P: Monitor and replete accordingly    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 PPI Management by [facility] Await pathology   Anemia of iron deficiency baseline hemoglobin 10-11 g% -Theressa Millard Lottie Dawson in oncology] prior to & Present on Admit  9/25 -having hematuria following surgery and the Foley catheter   P:  = Monitor hematuria - PRBC for hgb </= 6.9gm%    - exceptions are   -  if ACS susepcted/confirmed then transfuse for hgb </= 8.0gm%,  or    -  active bleeding with hemodynamic instability, then transfuse regardless of hemoglobin value  At at all times try to transfuse 1 unit prbc as possible with exception of active hemorrhage    At risk hypo and hyperglycemia At risk for relative adrenal insufficiency   P:   SSI Dextrose containing fluids Stress dose steroids hydrocortisone    Rheumatoid arthritis on immunosuppression through Dr. Dossie Der- Prior to & Present on [place] -Hold immunosuppression except stress dose steroids  Best practice (daily eval):  Diet: npo - might need TPN Pain/Anxiety/Delirium protocol (if indicated): PAD  VAP protocol (if indicated): yes DVT prophylaxis: SCD -> when ok by [facility] -> SQ heparin?  03/30/2022 GI prophylaxis: ppi Glucose control: ssi Mobility: bed rest Foley - yes on 90/24/23 Code Status:  Full Family Communication: husband, and 2 daughter updated at bedside Disposition: [facility] ICU     ATTESTATION & SIGNATURE   The patient Helen Hardy is critically ill with multiple organ systems failure and requires high complexity decision making for assessment and support, frequent evaluation and titration of therapies, application of advanced monitoring technologies and extensive interpretation of multiple databases.   Critical Care Time devoted to patient care services described in this note is  60  Minutes. This time reflects time of care of this signee Dr Brand Males. This critical care time does not reflect procedure time, or teaching time or supervisory time of PA/NP/Med student/Med Resident etc but could involve care discussion time      SIGNATURE    Dr. Brand Males, M.D., F.C.C.P,  Pulmonary and Critical Care Medicine Staff Physician, [place] Director - Interstitial Lung Disease  Program  [facility] [place] [facility] [place], [place], [address]  NPI Number:  NPI #[id number]  Pager: [phone], If no answer  -> Check AMION or Try [phone] Telephone (clinical office): [phone] Telephone (research): [phone]  12:41 AM 03/30/2022   03/30/2022 12:41 AM    LABS    PULMONARY Recent Labs  Lab 03/19/2022 2323  PHART 7.22*  PCO2ART 41  PO2ART 111*  HCO3 17.2*  O2SAT 97.8    CBC Recent Labs  Lab 03/10/2022 1435 03/22/2022 1819  HGB 12.4 11.9*  HCT 40.0 38.8  WBC 4.3 3.1*  PLT 196 179    COAGULATION Recent Labs  Lab 03/28/2022 1819  INR 1.3*    CARDIAC  No results for input(s): "TROPONINI" in the last 168 hours. No results for input(s): "PROBNP" in the last 168 hours.  CHEMISTRY Recent Labs  Lab 03/27/2022 1435 03/28/2022 1819  NA 145 141  K 4.2 4.0  CL 111 112*  CO2 21* 21*  GLUCOSE 138* 117*  BUN 26* 26*  CREATININE 1.35* 1.35*  CALCIUM 9.3 8.4*   Estimated  Creatinine Clearance: 39.9 mL/min (A) (by C-G formula based on SCr of 1.35 mg/dL (H)).   LIVER Recent Labs  Lab 03/06/2022 1435 03/21/2022 1819  AST 25  --   ALT 17  --   ALKPHOS 72  --   BILITOT 0.7  --   PROT 7.5  --   ALBUMIN 3.6  --   INR  --  1.3*     INFECTIOUS Recent Labs  Lab 04/02/2022 1820 03/12/2022 2345  LATICACIDVEN 3.8* 3.9*     ENDOCRINE CBG (last 3)  No results for input(s): "GLUCAP" in the last 72 hours.       IMAGING x48h  - image(s) personally visualized  -   highlighted in bold CT Angio Abd/Pel W and/or  Wo Contrast  Result Date: 04/03/2022 CLINICAL DATA:  Acute onset of severe abdominal pain, nausea and vomiting, and diarrhea. Clinical suspicion for bowel ischemia. Previous diverticulitis. EXAM: CTA ABDOMEN AND PELVIS WITHOUT AND WITH CONTRAST TECHNIQUE: Multidetector CT imaging of the abdomen and pelvis was performed using the standard protocol during bolus administration of intravenous contrast. Multiplanar reconstructed images and MIPs were obtained and reviewed to evaluate the vascular anatomy. RADIATION DOSE REDUCTION: This exam was performed according to the departmental dose-optimization program which includes automated exposure control, adjustment of the mA and/or kV according to patient size and/or use of iterative reconstruction technique. CONTRAST:  38m OMNIPAQUE IOHEXOL 350 MG/ML SOLN COMPARISON:  06/17/2021 FINDINGS: VASCULAR Aorta: Normal caliber aorta without aneurysm, dissection, vasculitis or significant stenosis. Celiac: Patent without evidence of aneurysm, dissection, vasculitis or significant stenosis. SMA: Patent without evidence of aneurysm, dissection, vasculitis or significant stenosis. Renals: Both renal arteries are patent without evidence of aneurysm, dissection, vasculitis, fibromuscular dysplasia or significant stenosis. IMA: Patent. Inflow: Patent without evidence of aneurysm, dissection, vasculitis or significant stenosis. Proximal  Outflow: Bilateral common femoral and visualized portions of the superficial and profunda femoral arteries are patent without evidence of aneurysm, dissection, vasculitis or significant stenosis. Veins: No obvious venous abnormality within the limitations of this arterial phase study. Review of the MIP images confirms the above findings. NON-VASCULAR Lower Chest: Bilateral lower lobe infiltrates. No evidence of pleural effusion. Hepatobiliary: No hepatic masses identified. Gallbladder is unremarkable. No evidence of biliary ductal dilatation. Pancreas:  No mass or inflammatory changes. Spleen: Within normal limits in size and appearance. Adrenals/Urinary Tract: No suspicious masses identified. No evidence of ureteral calculi or hydronephrosis. Stomach/Bowel: Moderate amount of free intraperitoneal air is seen, consistent with bowel perforation. Moderate sigmoid diverticulitis is seen with adjacent extraluminal air bubbles, consistent with perforation. Adjacent diverticular abscess containing gas and fluid is also seen measuring 4.2 x 4.1 cm. Vascular/Lymphatic: No pathologically enlarged lymph nodes. No acute vascular findings. Reproductive: Prior hysterectomy noted. Adnexal regions are unremarkable in appearance. Other:  Small left inguinal hernia is seen which contains only fat. Musculoskeletal:  No suspicious bone lesions identified. IMPRESSION: No evidence of mesenteric ischemia or other acute vascular pathology. Perforated sigmoid diverticulitis, with moderate amount of free intraperitoneal air. 4.2 cm sigmoid diverticular abscess. Bilateral lower lobe infiltrates. Small left inguinal hernia, which contains only fat. Critical Value/emergent results were called by telephone at the time of interpretation on 03/12/2022 at 6:38 pm to provider [facility], who verbally acknowledged these results. Electronically Signed   By: JMarlaine HindM.D.   On: 03/22/2022 18:41   DG Chest Port 1 View  Result Date:  03/09/2022 CLINICAL DATA:  Questionable sepsis. EXAM: PORTABLE CHEST 1 VIEW COMPARISON:  Nov 08, 2020 FINDINGS: Tortuosity of the thoracic aorta. Cardiomediastinal silhouette is normal. Mediastinal contours appear intact. There is no evidence of lobar airspace consolidation, pleural effusion or pneumothorax. Low lung volumes. Bibasilar atelectasis versus peribronchial airspace consolidation. Osseous structures are without acute abnormality. Soft tissues are grossly normal. IMPRESSION: Low lung volumes with bibasilar atelectasis versus peribronchial airspace consolidation. Electronically Signed   By: DFidela SalisburyM.D.   On: 03/22/2022 18:15

## 2022-03-30 NOTE — Procedures (Signed)
Arterial Catheter Insertion Procedure Note  Helen Hardy  [id number]  1950/11/29  Date:03/30/22  Time:1:55 PM    Provider Performing: Otilio Carpen Nyeem Stoke    Procedure: Insertion of Arterial Line [phone]) with US guidance ([id number])   Indication(s) Blood pressure monitoring and/or need for frequent ABGs  Consent Risks of the procedure as well as the alternatives and risks of each were explained to the patient and/or caregiver.  Consent for the procedure was obtained and is signed in the bedside chart  Anesthesia Lidocaine   Time Out Verified patient identification, verified procedure, site/side was marked, verified correct patient position, special equipment/implants available, medications/allergies/relevant history reviewed, required imaging and test results available.   Sterile Technique Maximal sterile technique including full sterile barrier drape, hand hygiene, sterile gown, sterile gloves, mask, hair covering, sterile ultrasound probe cover (if used).   Procedure Description Area of catheter insertion was cleaned with chlorhexidine and draped in sterile fashion. With real-time ultrasound guidance an arterial catheter was placed into the right femoral artery.  Appropriate arterial tracings confirmed on monitor.     Complications/Tolerance None; patient tolerated the procedure well.   EBL Minimal   Specimen(s) None   Otilio Carpen Geena Weinhold, PA-C

## 2022-03-30 NOTE — Progress Notes (Signed)
RT NOTE:  RT attempted to place A-line x2 and was unsuccessful, CCM aware.

## 2022-03-30 NOTE — Progress Notes (Addendum)
[facility] Progress Note Patient Name: Helen Hardy DOB: 15-Jun-1951 MRN: [id number]   Date of Service  03/30/2022  HPI/Events of Note  CXR shows the ETT to be 1.2 cm above the carina.    eICU Interventions  Withdraw ETT by 2cm and repeat CXR to confirm placement. Pt taken off propofol gtt.  Versed gtt ordered. Continue fentanyl gtt.      Intervention Category Intermediate Interventions: Other:;Diagnostic test evaluation  Elsie Lincoln 03/30/2022, 1:23 AM  6:57 AM Troponin noted at 236 <-- 20.  EKG sinus tachycardia with no ST elevation.   Plan> Continue to trend troponin.

## 2022-03-30 NOTE — Progress Notes (Signed)
1 Day Post-Op ex lap, Sigmoidectomy, bladder repair, open abdominal vac placement Subjective: Remains on pressors, intubated, sedated  Objective: Vital signs in last 24 hours: Temp:  [98.2 F (36.8 C)-102 F (38.9 C)] 98.6 F (37 C) (09/25 0702) Pulse Rate:  [97-152] 113 (09/25 0615) Resp:  [18-44] 20 (09/25 0702) BP: (46-147)/(37-109) 97/60 (09/25 0419) SpO2:  [15 %-99 %] 99 % (09/25 0804) Arterial Line BP: (69-121)/(51-74) 77/58 (09/25 0702) FiO2 (%):  [60 %-100 %] 60 % (09/25 0804) Weight:  [092 kg] 103 kg (09/24 2212)   Intake/Output from previous day: 09/24 0701 - 09/25 0700 In: 8450.5 [I.V.:3530.4; IV Piggyback:4900.1] Out: 735 [Urine:285; Emesis/NG output:300; Drains:50; Blood:100] Intake/Output this shift: Total I/O In: 14.4 [I.V.:14.4] Out: 400 [Emesis/NG output:400]   General appearance: alert and cooperative GI: vac intact    Lab Results:  Recent Labs    03/15/2022 1819 03/30/22 0416  WBC 3.1* 6.8  HGB 11.9* 10.5*  HCT 38.8 33.8*  PLT 179 142*   BMET Recent Labs    03/26/2022 1819 03/30/22 0416  NA 141 140  K 4.0 4.4  CL 112* 114*  CO2 21* 17*  GLUCOSE 117* 167*  BUN 26* 26*  CREATININE 1.35* 1.83*  CALCIUM 8.4* 7.9*   PT/INR Recent Labs    03/31/2022 1819  LABPROT 15.8*  INR 1.3*   ABG Recent Labs    03/30/2022 2323  PHART 7.22*  HCO3 17.2*    MEDS, Scheduled  Chlorhexidine Gluconate Cloth  6 each Topical Daily   docusate  100 mg Per Tube BID   heparin  5,000 Units Subcutaneous Q8H   hydrocortisone sod succinate (SOLU-CORTEF) inj  100 mg Intravenous Q8H   [START ON 03/31/2022] influenza vaccine adjuvanted  0.5 mL Intramuscular Tomorrow-1000   insulin aspart  0-20 Units Subcutaneous Q4H   mouth rinse  15 mL Mouth Rinse Q2H   pantoprazole (PROTONIX) IV  40 mg Intravenous Q24H   [START ON 03/31/2022] pneumococcal 20-valent conjugate vaccine  0.5 mL Intramuscular Tomorrow-1000   polyethylene glycol  17 g Per Tube Daily     Studies/Results: DG Chest Port 1 View  Result Date: 03/30/2022 CLINICAL DATA:  Respiratory failure EXAM: PORTABLE CHEST 1 VIEW COMPARISON:  03/18/2022 FINDINGS: The endotracheal tube seen 4.0 cm above the carina. Nasogastric tube extends into the mid body of the stomach beyond the margin of the examination. Right internal jugular temporary hemodialysis catheter tip noted within the superior right atrium. Lung volumes are extremely small, but appears stable since prior examination. Bibasilar opacification is present suggesting the presence of small bilateral pleural effusions, left greater than right. No pneumothorax. Cardiac size within normal limits. Pulmonary vascularity is normal. No acute bone abnormality. IMPRESSION: 1. Support tubes in appropriate position. 2. Marked pulmonary hypoinflation. 3. Suspected small bilateral pleural effusions, left greater than right. Electronically Signed   By: Fidela Salisbury M.D.   On: 03/30/2022 03:34   DG CHEST PORT 1 VIEW  Result Date: 03/30/2022 CLINICAL DATA:  Intubation film, check tube placement. EXAM: PORTABLE CHEST 1 VIEW COMPARISON:  Portable chest earlier today at 6:05 p.m. FINDINGS: 11:36 p.m. ETT is in place with tip 1.2 cm from carina. Suggest withdrawing the tube 3 cm to a mid tracheal positioning to avoid right main bronchus intubation. There is a right IJ double-lumen catheter with its tip in the upper right atrium. There is no pneumothorax. Standard NGT is in place with the tip in the body of the stomach. The lungs are expiratory with basilar atelectasis  and limited view of the bases. Visualized lung fields are clear of infiltrate. There is mild cardiomegaly. No vascular congestion or edema is seen. Mild aortic tortuosity and ectasia.  There is thoracic spondylosis. IMPRESSION: 1. ETT tip is 1.2 cm from the carina, and could be withdrawn 3 cm to a mid tracheal positioning to lessen the likelihood of right main bronchus intubation if desired. 2.  Other support apparatus as above. 3. Expiratory exam with basilar atelectasis and limited view of the bases. 4. Cardiomegaly without CHF findings. Electronically Signed   By: Telford Nab M.D.   On: 03/30/2022 00:39   CT Angio Abd/Pel W and/or Wo Contrast  Result Date: 03/19/2022 CLINICAL DATA:  Acute onset of severe abdominal pain, nausea and vomiting, and diarrhea. Clinical suspicion for bowel ischemia. Previous diverticulitis. EXAM: CTA ABDOMEN AND PELVIS WITHOUT AND WITH CONTRAST TECHNIQUE: Multidetector CT imaging of the abdomen and pelvis was performed using the standard protocol during bolus administration of intravenous contrast. Multiplanar reconstructed images and MIPs were obtained and reviewed to evaluate the vascular anatomy. RADIATION DOSE REDUCTION: This exam was performed according to the departmental dose-optimization program which includes automated exposure control, adjustment of the mA and/or kV according to patient size and/or use of iterative reconstruction technique. CONTRAST:  25m OMNIPAQUE IOHEXOL 350 MG/ML SOLN COMPARISON:  06/17/2021 FINDINGS: VASCULAR Aorta: Normal caliber aorta without aneurysm, dissection, vasculitis or significant stenosis. Celiac: Patent without evidence of aneurysm, dissection, vasculitis or significant stenosis. SMA: Patent without evidence of aneurysm, dissection, vasculitis or significant stenosis. Renals: Both renal arteries are patent without evidence of aneurysm, dissection, vasculitis, fibromuscular dysplasia or significant stenosis. IMA: Patent. Inflow: Patent without evidence of aneurysm, dissection, vasculitis or significant stenosis. Proximal Outflow: Bilateral common femoral and visualized portions of the superficial and profunda femoral arteries are patent without evidence of aneurysm, dissection, vasculitis or significant stenosis. Veins: No obvious venous abnormality within the limitations of this arterial phase study. Review of the MIP images  confirms the above findings. NON-VASCULAR Lower Chest: Bilateral lower lobe infiltrates. No evidence of pleural effusion. Hepatobiliary: No hepatic masses identified. Gallbladder is unremarkable. No evidence of biliary ductal dilatation. Pancreas:  No mass or inflammatory changes. Spleen: Within normal limits in size and appearance. Adrenals/Urinary Tract: No suspicious masses identified. No evidence of ureteral calculi or hydronephrosis. Stomach/Bowel: Moderate amount of free intraperitoneal air is seen, consistent with bowel perforation. Moderate sigmoid diverticulitis is seen with adjacent extraluminal air bubbles, consistent with perforation. Adjacent diverticular abscess containing gas and fluid is also seen measuring 4.2 x 4.1 cm. Vascular/Lymphatic: No pathologically enlarged lymph nodes. No acute vascular findings. Reproductive: Prior hysterectomy noted. Adnexal regions are unremarkable in appearance. Other:  Small left inguinal hernia is seen which contains only fat. Musculoskeletal:  No suspicious bone lesions identified. IMPRESSION: No evidence of mesenteric ischemia or other acute vascular pathology. Perforated sigmoid diverticulitis, with moderate amount of free intraperitoneal air. 4.2 cm sigmoid diverticular abscess. Bilateral lower lobe infiltrates. Small left inguinal hernia, which contains only fat. Critical Value/emergent results were called by telephone at the time of interpretation on 03/15/2022 at 6:38 pm to [facility], who verbally acknowledged these results. Electronically Signed   By: JMarlaine HindM.D.   On: 03/22/2022 18:41   DG Chest Port 1 View  Result Date: 03/24/2022 CLINICAL DATA:  Questionable sepsis. EXAM: PORTABLE CHEST 1 VIEW COMPARISON:  Nov 08, 2020 FINDINGS: Tortuosity of the thoracic aorta. Cardiomediastinal silhouette is normal. Mediastinal contours appear intact. There is no evidence of lobar  airspace consolidation, pleural effusion or pneumothorax. Low lung  volumes. Bibasilar atelectasis versus peribronchial airspace consolidation. Osseous structures are without acute abnormality. Soft tissues are grossly normal. IMPRESSION: Low lung volumes with bibasilar atelectasis versus peribronchial airspace consolidation. Electronically Signed   By: Fidela Salisbury M.D.   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[place]) 12/25/2019   Cecal diverticulitis 12/25/2019   Rheumatoid arthritis ([place]) 12/25/2019   GERD (gastroesophageal reflux disease) 12/25/2019   Essential hypertension 12/25/2019   AKI (acute kidney injury) ([place]) 12/25/2019   Hyperglycemia 12/25/2019   Chronic radicular lumbar pain 12/25/2019   Generalized weakness 12/25/2019   Anemia of chronic renal failure, stage 3 (moderate) ([place]) 12/30/2018   Lumbar disc herniation 03/04/2016   Iron deficiency anemia 06/21/2015   Malabsorption of iron 06/21/2015   Morbid obesity ([place]) 04/10/2015   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  Plan for return to OR for closure once more stable Renal: bladder repair during surgery, do not remove foley.  Making some urine, ARF due to sepsis, Cont IVF's ID: on Mtx for RA, getting steroid stress dose, Flagyl and Zosyn   Discussed situation with family today.  All questions answered.   LOS: 1 day     .Rosario Adie, MD [facility], [place]    03/30/2022 8:54 AM

## 2022-03-30 NOTE — Progress Notes (Signed)
PHARMACY - PHYSICIAN COMMUNICATION CRITICAL VALUE ALERT - BLOOD CULTURE IDENTIFICATION (BCID)  Helen Hardy is an 71 y.o. female who presented to [facility] on 03/14/2022  Assessment:  1/4 staph species - likely contaminant On Zosyn + micafungin for intra-abdominal infection  Name of physician (or Provider) Contacted: Dr. Jennye Moccasin  Current antibiotics: Zosyn, micafungin  Changes to prescribed antibiotics recommended: none, likely contaminant. Defer to day team for further escalation if indicated.  Results for orders placed or performed during the hospital encounter of 03/16/2022  Blood Culture ID Panel (Reflexed) (Collected: 03/08/2022  6:10 PM)  Result Value Ref Range   Enterococcus faecalis NOT DETECTED NOT DETECTED   Enterococcus Faecium NOT DETECTED NOT DETECTED   Listeria monocytogenes NOT DETECTED NOT DETECTED   Staphylococcus species DETECTED (A) NOT DETECTED   Staphylococcus aureus (BCID) NOT DETECTED NOT DETECTED   Staphylococcus epidermidis NOT DETECTED NOT DETECTED   Staphylococcus lugdunensis NOT DETECTED NOT DETECTED   Streptococcus species NOT DETECTED NOT DETECTED   Streptococcus agalactiae NOT DETECTED NOT DETECTED   Streptococcus pneumoniae NOT DETECTED NOT DETECTED   Streptococcus pyogenes NOT DETECTED NOT DETECTED   A.calcoaceticus-baumannii NOT DETECTED NOT DETECTED   Bacteroides fragilis NOT DETECTED NOT DETECTED   Enterobacterales NOT DETECTED NOT DETECTED   Enterobacter cloacae complex NOT DETECTED NOT DETECTED   Escherichia coli NOT DETECTED NOT DETECTED   Klebsiella aerogenes NOT DETECTED NOT DETECTED   Klebsiella oxytoca NOT DETECTED NOT DETECTED   Klebsiella pneumoniae NOT DETECTED NOT DETECTED   Proteus species NOT DETECTED NOT DETECTED   Salmonella species NOT DETECTED NOT DETECTED   Serratia marcescens NOT DETECTED NOT DETECTED   Haemophilus influenzae NOT DETECTED NOT DETECTED   Neisseria meningitidis NOT DETECTED NOT DETECTED   Pseudomonas  aeruginosa NOT DETECTED NOT DETECTED   Stenotrophomonas maltophilia NOT DETECTED NOT DETECTED   Candida albicans NOT DETECTED NOT DETECTED   Candida auris NOT DETECTED NOT DETECTED   Candida glabrata NOT DETECTED NOT DETECTED   Candida krusei NOT DETECTED NOT DETECTED   Candida parapsilosis NOT DETECTED NOT DETECTED   Candida tropicalis NOT DETECTED NOT DETECTED   Cryptococcus neoformans/gattii NOT DETECTED NOT DETECTED    Tawnya Crook, PharmD, BCPS Clinical Pharmacist 03/30/2022 9:41 PM

## 2022-03-30 NOTE — Progress Notes (Signed)
ETT tube pulled back 2 cm Per MD order. ETT now secured at 18 cm.

## 2022-03-30 NOTE — Progress Notes (Signed)
Helen Hardy, MRN:  [id number], DOB:  1951-05-06, LOS: 1 ADMISSION DATE:  03/31/2022, CONSULTATION DATE:  9/24 REFERRING MD:  Donne Hazel, CHIEF COMPLAINT:  abdominal pain   History of Present Illness:  71 y/o female with extensive past medical history including rheumatoid arthritis on multiple immunosuppressants admitted with septic shock from perforated diverticulitis with bladder fistula requiring emergent ex-lap for sigmoid colectomy and bladder repair on 9/24.  She returned form the operating room early AM 9/25 with septic shock and remained on mechanical ventilatory support.    Pertinent  Medical History  Rheumatoid arthritis GERD Anemia of chronic disease Hyeprtension Non-hodgkin's lymphoma Prior ischemic colitis  Significant Hospital Events: Including procedures, antibiotic start and stop dates in addition to other pertinent events   9/24 admission for septic shock from perforated diverticulitis, emergent ex-lap with Dr. Donne Hazel for sigmoidectomy and bladder repair, brought to the ICU Blood culture 03/31/2022>> Urine culture 03/19/2022>>  9/24 cefepime x 1 9/24 vanc  x 1 9/24 flagyl x 1 9/24 zosyn >> 9/25 micafungin >   Interim History / Subjective:     Objective   Blood pressure 97/60, pulse (!) 113, temperature 98.6 F (37 C), resp. rate 20, height 4' 11"  (1.499 m), weight 103 kg, SpO2 98 %. CVP:  [9 mmHg] 9 mmHg  Vent Mode: PRVC FiO2 (%):  [70 %-100 %] 70 % Set Rate:  [20 bmp] 20 bmp Vt Set:  [340 mL] 340 mL PEEP:  [5 cmH20] 5 cmH20 Plateau Pressure:  [14 cmH20-16 cmH20] 16 cmH20   Intake/Output Summary (Last 24 hours) at 03/30/2022 3710 Last data filed at 03/30/2022 0702 Gross per 24 hour  Intake 8464.97 ml  Output 735 ml  Net 7729.97 ml   Filed Weights   03/31/2022 2212  Weight: 103 kg    Examination: General:  In bed on vent HENT: NCAT ETT in place PULM: CTA B, vent supported breathing CV: Tacycardic, no mgr GI: no bowel sounds, wound vac  in place MSK: normal bulk and tone Neuro: sedated on vent, wakes to voice Derm: diaphoretic  9/25 CXR > low lung volumes, ett in place no infiltrate personally reviewed  Resolved Hospital Problem list     Assessment & Plan:  Acute respiratory failure with hypoxemia due to septic shock Full mechanical vent support VAP prevention Daily WUA/SBT Wean down FiO2 for SaO2 > 88% Repeat ABG now  Septic shock due to perforated diverticulitis Immunocompromised Entero-vesicula fistula s/p repair On steroids, 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 Vasopressin, levophed for MAP > 65 Hydrocortisone 164m IV q8h Continue zosyn for now Hold vanc Add micafungin  Chronic pain, narcotic dependence at baseline Need for sedation for mechanical ventilation RASS goal -2 Fentanyl infusion  Stop versed, add precedex PAD protocol  Demand myocardial ischemia due to septic shock Echo tele  Oliguric AKI Baseline CKD Metabolic acidosis from lactic acidosis from septic shock Monitor BMET and UOP Replace electrolytes as needed Repeat BMET  Repeat ABG  S/p Ex-lap for sigmoid colectomy and bladder repair NPO Wound vac, nutrition per primary service  Anemia due to Fe deficiency Monitor for bleeding Transfuse PRBC for Hgb < 7 gm/dL  Hyperglycemia Add SSI  Prognosis guarded, high risk of death given multi-organ failure and baseline comorbid illnesses   Best Practice (right click and "Reselect all SmartList Selections" daily)   Diet/type: NPO DVT prophylaxis: prophylactic heparin  GI prophylaxis: PPI Lines: Central line and yes and it is still needed Foley:  Yes, and it is still needed  Code Status:  full code Last date of multidisciplinary goals of care discussion [per primary, family updated 9/25]  Labs   CBC: Recent Labs  Lab 03/11/2022 1435 03/31/2022 1819 03/30/22 0416  WBC 4.3 3.1* 6.8  NEUTROABS 1.3* 1.1*  --   HGB 12.4 11.9* 10.5*  HCT 40.0 38.8 33.8*  MCV  112.0* 112.8* 112.3*  PLT 196 179 142*    Basic Metabolic Panel: Recent Labs  Lab 03/09/2022 1435 03/14/2022 1819 03/30/22 0416  NA 145 141 140  K 4.2 4.0 4.4  CL 111 112* 114*  CO2 21* 21* 17*  GLUCOSE 138* 117* 167*  BUN 26* 26* 26*  CREATININE 1.35* 1.35* 1.83*  CALCIUM 9.3 8.4* 7.9*   GFR: Estimated Creatinine Clearance: 29.4 mL/min (A) (by C-G formula based on SCr of 1.83 mg/dL (H)). Recent Labs  Lab 03/31/2022 1435 03/17/2022 1819 03/28/2022 1820 03/31/2022 2345 03/30/22 0416 03/30/22 0500  WBC 4.3 3.1*  --   --  6.8  --   LATICACIDVEN  --   --  3.8* 3.9*  --  3.4*    Liver Function Tests: Recent Labs  Lab 03/22/2022 1435 03/30/22 0416  AST 25 39  ALT 17 23  ALKPHOS 72 52  BILITOT 0.7 0.7  PROT 7.5 4.8*  ALBUMIN 3.6 2.2*   Recent Labs  Lab 03/28/2022 1435  LIPASE 22   No results for input(s): "AMMONIA" in the last 168 hours.  ABG    Component Value Date/Time   PHART 7.22 (L) 03/24/2022 2323   PCO2ART 41 03/06/2022 2323   PO2ART 111 (H) 03/12/2022 2323   HCO3 17.2 (L) 03/21/2022 2323   TCO2 25 03/25/2020 0649   ACIDBASEDEF 10.3 (H) 03/19/2022 2323   O2SAT 97.8 03/27/2022 2323     Coagulation Profile: Recent Labs  Lab 04/04/2022 1819  INR 1.3*    Cardiac Enzymes: No results for input(s): "CKTOTAL", "CKMB", "CKMBINDEX", "TROPONINI" in the last 168 hours.  HbA1C: Hgb A1c MFr Bld  Date/Time Value Ref Range Status  12/26/2019 05:30 AM 5.7 (H) 4.8 - 5.6 % Final    Comment:    (NOTE)         Prediabetes: 5.7 - 6.4         Diabetes: >6.4         Glycemic control for adults with diabetes: <7.0     CBG: Recent Labs  Lab 04/03/2022 2211  GLUCAP 100*     Critical care time: 45 minutes    Roselie Awkward, MD [place] [facility] Pager: [phone] Cell: [phone] After 7:00 pm call Elink  [phone]

## 2022-03-30 NOTE — Procedures (Signed)
Arterial Catheter Insertion Procedure Note  Zsofia Prout  [id number]  April 22, 1951  Date:03/30/22  Time:10:28 AM    Provider Performing: Otilio Carpen Shariya Gaster    Procedure: Insertion of Arterial Line [phone]) with US guidance ([id number])   Indication(s) Blood pressure monitoring and/or need for frequent ABGs  Consent Risks of the procedure as well as the alternatives and risks of each were explained to the patient and/or caregiver.  Consent for the procedure was obtained and is signed in the bedside chart  Anesthesia None   Time Out Verified patient identification, verified procedure, site/side was marked, verified correct patient position, special equipment/implants available, medications/allergies/relevant history reviewed, required imaging and test results available.   Sterile Technique Maximal sterile technique including full sterile barrier drape, hand hygiene, sterile gown, sterile gloves, mask, hair covering, sterile ultrasound probe cover (if used).   Procedure Description Area of catheter insertion was cleaned with chlorhexidine and draped in sterile fashion. With real-time ultrasound guidance an arterial catheter was placed into the left radial artery.  Appropriate arterial tracings confirmed on monitor.     Complications/Tolerance None; patient tolerated the procedure well.   EBL Minimal   Specimen(s) None   Otilio Carpen Shardai Star, PA-C

## 2022-03-31 ENCOUNTER — Telehealth: Payer: Self-pay | Admitting: *Deleted

## 2022-03-31 DIAGNOSIS — J9601 Acute respiratory failure with hypoxia: Secondary | ICD-10-CM

## 2022-03-31 DIAGNOSIS — A419 Sepsis, unspecified organism: Secondary | ICD-10-CM

## 2022-03-31 DIAGNOSIS — K668 Other specified disorders of peritoneum: Secondary | ICD-10-CM

## 2022-03-31 DIAGNOSIS — K659 Peritonitis, unspecified: Secondary | ICD-10-CM

## 2022-03-31 LAB — BLOOD GAS, ARTERIAL
Acid-base deficit: 8.3 mmol/L — ABNORMAL HIGH (ref 0.0–2.0)
Bicarbonate: 17 mmol/L — ABNORMAL LOW (ref 20.0–28.0)
Drawn by: 54496
O2 Saturation: 98.7 %
Patient temperature: 36.5
pCO2 arterial: 32 mmHg (ref 32–48)
pH, Arterial: 7.33 — ABNORMAL LOW (ref 7.35–7.45)
pO2, Arterial: 100 mmHg (ref 83–108)

## 2022-03-31 LAB — POCT I-STAT 7, (LYTES, BLD GAS, ICA,H+H)
Acid-base deficit: 12 mmol/L — ABNORMAL HIGH (ref 0.0–2.0)
Acid-base deficit: 13 mmol/L — ABNORMAL HIGH (ref 0.0–2.0)
Bicarbonate: 14.6 mmol/L — ABNORMAL LOW (ref 20.0–28.0)
Bicarbonate: 14.9 mmol/L — ABNORMAL LOW (ref 20.0–28.0)
Calcium, Ion: 0.75 mmol/L — CL (ref 1.15–1.40)
Calcium, Ion: 0.81 mmol/L — CL (ref 1.15–1.40)
HCT: 32 % — ABNORMAL LOW (ref 36.0–46.0)
HCT: 28 % — ABNORMAL LOW (ref 36.0–46.0)
Hemoglobin: 10.9 g/dL — ABNORMAL LOW (ref 12.0–15.0)
Hemoglobin: 9.5 g/dL — ABNORMAL LOW (ref 12.0–15.0)
O2 Saturation: 98 %
O2 Saturation: 93 %
Potassium: 4.2 mmol/L (ref 3.5–5.1)
Potassium: 4.2 mmol/L (ref 3.5–5.1)
Sodium: 141 mmol/L (ref 135–145)
Sodium: 141 mmol/L (ref 135–145)
TCO2: 16 mmol/L — ABNORMAL LOW (ref 22–32)
TCO2: 16 mmol/L — ABNORMAL LOW (ref 22–32)
pCO2 arterial: 35.9 mmHg (ref 32–48)
pCO2 arterial: 43.3 mmHg (ref 32–48)
pH, Arterial: 7.217 — ABNORMAL LOW (ref 7.35–7.45)
pH, Arterial: 7.143 — CL (ref 7.35–7.45)
pO2, Arterial: 124 mmHg — ABNORMAL HIGH (ref 83–108)
pO2, Arterial: 88 mmHg (ref 83–108)

## 2022-03-31 LAB — COMPREHENSIVE METABOLIC PANEL
ALT: 32 U/L (ref 0–44)
AST: 56 U/L — ABNORMAL HIGH (ref 15–41)
Albumin: 3 g/dL — ABNORMAL LOW (ref 3.5–5.0)
Alkaline Phosphatase: 49 U/L (ref 38–126)
Anion gap: 11 (ref 5–15)
BUN: 28 mg/dL — ABNORMAL HIGH (ref 8–23)
CO2: 17 mmol/L — ABNORMAL LOW (ref 22–32)
Calcium: 7.7 mg/dL — ABNORMAL LOW (ref 8.9–10.3)
Chloride: 115 mmol/L — ABNORMAL HIGH (ref 98–111)
Creatinine, Ser: 2.18 mg/dL — ABNORMAL HIGH (ref 0.44–1.00)
GFR, Estimated: 23 mL/min — ABNORMAL LOW (ref >60)
Glucose, Bld: 84 mg/dL (ref 70–99)
Potassium: 4.8 mmol/L (ref 3.5–5.1)
Sodium: 143 mmol/L (ref 135–145)
Total Bilirubin: 1.3 mg/dL — ABNORMAL HIGH (ref 0.3–1.2)
Total Protein: 5.6 g/dL — ABNORMAL LOW (ref 6.5–8.1)

## 2022-03-31 LAB — BASIC METABOLIC PANEL
Anion gap: 8 (ref 5–15)
BUN: 31 mg/dL — ABNORMAL HIGH (ref 8–23)
CO2: 20 mmol/L — ABNORMAL LOW (ref 22–32)
Calcium: 7.3 mg/dL — ABNORMAL LOW (ref 8.9–10.3)
Chloride: 114 mmol/L — ABNORMAL HIGH (ref 98–111)
Creatinine, Ser: 1.86 mg/dL — ABNORMAL HIGH (ref 0.44–1.00)
GFR, Estimated: 28 mL/min — ABNORMAL LOW (ref >60)
Glucose, Bld: 140 mg/dL — ABNORMAL HIGH (ref 70–99)
Potassium: 3.8 mmol/L (ref 3.5–5.1)
Sodium: 142 mmol/L (ref 135–145)

## 2022-03-31 LAB — GLUCOSE, CAPILLARY
Glucose-Capillary: 91 mg/dL (ref 70–99)
Glucose-Capillary: 111 mg/dL — ABNORMAL HIGH (ref 70–99)
Glucose-Capillary: 126 mg/dL — ABNORMAL HIGH (ref 70–99)
Glucose-Capillary: 131 mg/dL — ABNORMAL HIGH (ref 70–99)
Glucose-Capillary: 116 mg/dL — ABNORMAL HIGH (ref 70–99)
Glucose-Capillary: 110 mg/dL — ABNORMAL HIGH (ref 70–99)

## 2022-03-31 LAB — CBC
HCT: 28.1 % — ABNORMAL LOW (ref 36.0–46.0)
Hemoglobin: 8.9 g/dL — ABNORMAL LOW (ref 12.0–15.0)
MCH: 34.4 pg — ABNORMAL HIGH (ref 26.0–34.0)
MCHC: 31.7 g/dL (ref 30.0–36.0)
MCV: 108.5 fL — ABNORMAL HIGH (ref 80.0–100.0)
Platelets: 118 10*3/uL — ABNORMAL LOW (ref 150–400)
RBC: 2.59 MIL/uL — ABNORMAL LOW (ref 3.87–5.11)
RDW: 16.7 % — ABNORMAL HIGH (ref 11.5–15.5)
WBC: 8.4 10*3/uL (ref 4.0–10.5)
nRBC: 0.6 % — ABNORMAL HIGH (ref 0.0–0.2)

## 2022-03-31 LAB — URINE CULTURE: Culture: NO GROWTH

## 2022-03-31 LAB — HEMOGLOBIN AND HEMATOCRIT, BLOOD
HCT: 25.8 % — ABNORMAL LOW (ref 36.0–46.0)
Hemoglobin: 8.4 g/dL — ABNORMAL LOW (ref 12.0–15.0)

## 2022-03-31 LAB — CORTISOL: Cortisol, Plasma: >100 ug/dL

## 2022-03-31 MED ORDER — POTASSIUM CHLORIDE 10 MEQ/50ML IV SOLN
10.0000 meq | INTRAVENOUS | Status: AC
  Administered 2022-03-31 (×2): 10 meq via INTRAVENOUS
  Filled 2022-03-31 (×2): qty 50

## 2022-03-31 MED ORDER — DARBEPOETIN ALFA 300 MCG/0.6ML IJ SOSY: 300.0000 ug | PREFILLED_SYRINGE | Freq: Once | INTRAMUSCULAR | Status: DC

## 2022-03-31 MED ORDER — DEXTROSE IN LACTATED RINGERS 5 % IV SOLN: INTRAVENOUS | Status: DC

## 2022-03-31 MED ORDER — SODIUM BICARBONATE 8.4 % IV SOLN
INTRAVENOUS | Status: DC
  Filled 2022-03-31 (×2): qty 1000
  Filled 2022-03-31: qty 150

## 2022-03-31 NOTE — Consult Note (Signed)
Has been aware of [name] admission.  I know her well.  I have probably known her for 15 years.  She had a past history of low-grade non-Hodgkin's lymphoma.  She has had no treatment for this for many years.  She has had most issues with anemia.  We have been treating the anemia with ESA and with iron on occasion.  Her main problem has been rheumatoid arthritis.  She has horrible rheumatoid arthritis.  She has been on steroids for this.  Apparently, she was admitted on 03/19/2022.  She had abdominal pain.  She has history of ischemic colitis.  She developed perforation.  She was taken to emergency surgery.  She made it through surgery.  She has an open abdominal wound.  At the time of surgery, she had a sigmoid colectomy.  She had bladder repair.  She had peritonitis.  She currently is intubated in the ICU.  She is on pressors.  White count 8.4.  Hemoglobin 8.9.  Platelet count 118,000.  Her BUN is 28 creatinine 2.18.  Calcium 7.7 with an albumin of 3.0.  I just really hate this for [name].  Again, her problem has always been the rheumatoid arthritis.  Being on a lot of steroids for this is really not helped with respect to her perforation and even the healing of her surgery.  She has extensive the abdominal wound.  She has an abdominal wound vacuum attached.  Her vital signs show temperature of 97.9.  Pulse 84.  Blood pressure 106/62.  Her lungs sound pretty clear bilaterally.  Cardiac exam regular rate and rhythm.  She has 1/6 systolic murmur.  Her abdomen is dressed from her abdominal surgery.  She has a vacuum attached.  Extremity shows severe changes of rheumatoid arthritis.  Neurological exam is done focal.  For right now, there really is nothing that we need to do with [name].  Again, she has not had lymphoma for many years.  She has had no treatment for lymphoma for many years.  We will have given her iron and ESA for her anemia.  Her hemoglobin is low.  I would just watch this for  right now.  If there is anything we can do to try to help out we certainly will.  I know that she will get incredible care from everybody down in the ICU.  Helen Haw, MD  Jeneen Rinks 1:5-7

## 2022-03-31 NOTE — Progress Notes (Addendum)
eLink Physician-Brief Progress Note Patient Name: Helen Hardy DOB: 1951/04/08 MRN: [id number]   Date of Service  03/31/2022  HPI/Events of Note  71 year old female immunosuppressed from RA on chronic steroids +  Leflunomide also with CKD , non-Hodgkin's lymphoma, ischemic colitis ,  found to have perforated diverticulitis with free air along with lactic acidosis on 03/30/2022.  Developed septic shock prior to the surgery.  Taken emergently to surgery  for exploratory laparotomy with sigmoid colectomy, repair of bladder injury with possible fistula   E-link called for NPO status with CBG 70 , pH 7.33/Bicarb 13->17   eICU Interventions  -d/c NS, changed to D5LR at 50cc/hr -> will change to bicarb till levels > 20  -possible adrenal insifficiency, but already on steroids IV  - check cortisol level         Jaccob Czaplicki N Shavone Nevers 03/31/2022, 12:20 AM

## 2022-03-31 NOTE — Progress Notes (Signed)
Initial Nutrition Assessment  DOCUMENTATION CODES:   Morbid obesity  INTERVENTION:   -If remains intubated following surgery, recommend initiation of nutrition support, TPN  -Will continue to monitor plan   NUTRITION DIAGNOSIS:   Inadequate oral intake related to inability to eat as evidenced by NPO status.  GOAL:   Provide needs based on ASPEN/SCCM guidelines  MONITOR:   Labs, Weight trends, Vent status, I & O's  REASON FOR ASSESSMENT:   Ventilator    ASSESSMENT:   71 y/o female with extensive past medical history including rheumatoid arthritis on multiple immunosuppressants admitted with septic shock from perforated diverticulitis with bladder fistula requiring emergent ex-lap for sigmoid colectomy and bladder repair on 9/24.  She returned form the operating room early AM 9/25 with septic shock and remained on mechanical ventilatory support.  9/24: admitted, s/p ex lap, sigmoid colectomy, bladder repair, Wound VAC, remained intubated  Patient in room, sedated on vent. OGT in place. Daughter at bedside. States her mother has not been eating well for almost a year now. Since becoming wheelchair bound and multiple surgeries on her right knee she has noticed a change in her mother's eating habits and sleeping habits. Suspects depression has played a part in this as well. Pt takes a lot of medications which tend to make pt feel full. The pt tends to sleep late and thus only consumes 1-2 meals at best.  Pt's daughter believes pt has lost weight.  Per surgery note, plan is for abdominal closure and ostomy surgery tomorrow.  Patient is currently intubated on ventilator support MV: 6.7 L/min Temp (24hrs), Avg:98.2 F (36.8 C), Min:97.7 F (36.5 C), Max:99.1 F (37.3 C)  Medications: Colace, Precedex, D5-lactated ringers infusion, Fentanyl, Levophed  Labs reviewed: CBGs: 70-111  NUTRITION - FOCUSED PHYSICAL EXAM:  No depletions noted.  Diet Order:   Diet Order              Diet NPO time specified  Diet effective now                   EDUCATION NEEDS:   No education needs have been identified at this time  Skin:     Last BM:  9/24  Height:   Ht Readings from Last 1 Encounters:  03/11/2022 4' 11"  (1.499 m)    Weight:   Wt Readings from Last 1 Encounters:  03/08/2022 103 kg    BMI:  Body mass index is 45.86 kg/m.  Estimated Nutritional Needs:   Kcal:  1200-1450  Protein:  >150g  Fluid:  1.6L/day  Clayton Bibles, MS, RD, LDN Inpatient Clinical Dietitian Contact information available via Amion

## 2022-03-31 NOTE — Progress Notes (Signed)
2 Days Post-Op ex lap, Sigmoidectomy, bladder repair, open abdominal vac placement Subjective:  Pressors requirement significantly decreased, intubated, sedated  Objective: Vital signs in last 24 hours: Temp:  [97.7 F (36.5 C)-99.1 F (37.3 C)] 98.1 F (36.7 C) (09/26 0730) Pulse Rate:  [84-125] 84 (09/26 0353) Resp:  [12-23] 19 (09/26 0730) BP: (79-161)/(30-88) 106/62 (09/26 0353) SpO2:  [96 %-100 %] 99 % (09/26 0400) Arterial Line BP: (73-158)/(44-103) 130/72 (09/26 0730) FiO2 (%):  [30 %-70 %] 30 % (09/26 0400)   Intake/Output from previous day: 09/25 0701 - 09/26 0700 In: 4148.8 [I.V.:3516; IV Piggyback:592.9] Out: 3900 [Urine:1300; Emesis/NG output:1350; Drains:1250] Intake/Output this shift: Total I/O In: 58.5 [I.V.:58.5] Out: 70 [Urine:70]   General appearance: alert and cooperative GI: vac intact    Lab Results:  Recent Labs    03/30/22 0416 03/31/22 0036  WBC 6.8 8.4  HGB 10.5* 8.9*  HCT 33.8* 28.1*  PLT 142* 118*    BMET Recent Labs    03/30/22 1100 03/31/22 0036  NA 138 143  K 4.7 4.8  CL 112* 115*  CO2 13* 17*  GLUCOSE 162* 84  BUN 27* 28*  CREATININE 2.03* 2.18*  CALCIUM 7.8* 7.7*    PT/INR Recent Labs    03/27/2022 1819  LABPROT 15.8*  INR 1.3*    ABG Recent Labs    03/30/22 0930 03/31/22 0036  PHART 7.22* 7.33*  HCO3 13.1* 17.0*     MEDS, Scheduled  Chlorhexidine Gluconate Cloth  6 each Topical Daily   docusate  100 mg Per Tube BID   heparin flush  10 Units Intracatheter Once   heparin  5,000 Units Subcutaneous Q8H   hydrocortisone sod succinate (SOLU-CORTEF) inj  100 mg Intravenous Q8H   influenza vaccine adjuvanted  0.5 mL Intramuscular Tomorrow-1000   insulin aspart  0-20 Units Subcutaneous Q4H   mupirocin ointment  1 Application Nasal BID   mouth rinse  15 mL Mouth Rinse Q2H   pantoprazole (PROTONIX) IV  40 mg Intravenous Q24H   pneumococcal 20-valent conjugate vaccine  0.5 mL Intramuscular Tomorrow-1000     Studies/Results: ECHOCARDIOGRAM COMPLETE  Result Date: 03/30/2022    ECHOCARDIOGRAM REPORT   Patient Name:   Helen Hardy Date of Exam: 03/30/2022 Medical Rec #:  [id number]         Height:       59.0 in Accession #:    [id number]        Weight:       227.1 lb Date of Birth:  02-Jan-1951         BSA:          1.947 m Patient Age:    71 years          BP:           97/60 mmHg Patient Gender: F                 HR:           120 bpm. Exam Location:  Inpatient Procedure: 2D Echo, Color Doppler and Cardiac Doppler Indications:    Acute respiratory distress  History:        Patient has no prior history of Echocardiogram examinations.                 Risk Factors:Hypertension.  Sonographer:    Memory Argue Referring Phys: [place]  1. Left ventricular ejection fraction, by estimation, is 35 to 40%. Left ventricular ejection fraction by 2D MOD  biplane is 39.9 %. The left ventricle has moderately decreased function. The left ventricle demonstrates global hypokinesis. Indeterminate diastolic filling due to E-A fusion.  2. Right ventricular systolic function is mildly reduced. The right ventricular size is normal. There is normal pulmonary artery systolic pressure. The estimated right ventricular systolic pressure is 36.6 mmHg.  3. The mitral valve is abnormal. No evidence of mitral valve regurgitation.  4. The aortic valve is tricuspid. Aortic valve regurgitation is not visualized. Aortic valve sclerosis is present, with no evidence of aortic valve stenosis.  5. The inferior vena cava is normal in size with greater than 50% respiratory variability, suggesting right atrial pressure of 3 mmHg. Comparison(s): No prior Echocardiogram. FINDINGS  Left Ventricle: Left ventricular ejection fraction, by estimation, is 35 to 40%. Left ventricular ejection fraction by 2D MOD biplane is 39.9 %. The left ventricle has moderately decreased function. The left ventricle demonstrates global hypokinesis.  The left ventricular internal cavity size was normal in size. There is no left ventricular hypertrophy. Indeterminate diastolic filling due to E-A fusion. Right Ventricle: The right ventricular size is normal. No increase in right ventricular wall thickness. Right ventricular systolic function is mildly reduced. There is normal pulmonary artery systolic pressure. The tricuspid regurgitant velocity is 2.35 m/s, and with an assumed right atrial pressure of 3 mmHg, the estimated right ventricular systolic pressure is 44.0 mmHg. Left Atrium: Left atrial size was normal in size. Right Atrium: Right atrial size was normal in size. Pericardium: There is no evidence of pericardial effusion. Mitral Valve: The mitral valve is abnormal. There is mild calcification of the mitral valve leaflet(s). No evidence of mitral valve regurgitation. Tricuspid Valve: The tricuspid valve is grossly normal. Tricuspid valve regurgitation is trivial. Aortic Valve: The aortic valve is tricuspid. Aortic valve regurgitation is not visualized. Aortic valve sclerosis is present, with no evidence of aortic valve stenosis. Aortic valve mean gradient measures 2.0 mmHg. Aortic valve peak gradient measures 4.0  mmHg. Aortic valve area, by VTI measures 3.01 cm. Pulmonic Valve: The pulmonic valve was normal in structure. Pulmonic valve regurgitation is not visualized. Aorta: The aortic root and ascending aorta are structurally normal, with no evidence of dilitation. Venous: The inferior vena cava is normal in size with greater than 50% respiratory variability, suggesting right atrial pressure of 3 mmHg. IAS/Shunts: No atrial level shunt detected by color flow Doppler.  LEFT VENTRICLE PLAX 2D                        Biplane EF (MOD) LVIDd:         4.00 cm         LV Biplane EF:   Left LVIDs:         3.30 cm                          ventricular LV PW:         0.90 cm                          ejection LV IVS:        0.90 cm                          fraction  by LVOT diam:     2.10 cm  2D MOD LV SV:         32                               biplane is LV SV Index:   17                               39.9 %. LVOT Area:     3.46 cm                                Diastology                                LV e' medial:    5.00 cm/s LV Volumes (MOD)               LV E/e' medial:  8.8 LV vol d, MOD    79.9 ml       LV e' lateral:   7.29 cm/s A2C:                           LV E/e' lateral: 6.0 LV vol d, MOD    77.3 ml A4C: LV vol s, MOD    58.8 ml A2C: LV vol s, MOD    45.8 ml A4C: LV SV MOD A2C:   21.1 ml LV SV MOD A4C:   77.3 ml LV SV MOD BP:    34.4 ml RIGHT VENTRICLE RV S prime:     9.68 cm/s TAPSE (M-mode): 1.3 cm LEFT ATRIUM             Index        RIGHT ATRIUM           Index LA diam:        2.80 cm 1.44 cm/m   RA Area:     11.90 cm LA Vol (A2C):   30.8 ml 15.82 ml/m  RA Volume:   21.70 ml  11.15 ml/m LA Vol (A4C):   43.9 ml 22.55 ml/m LA Biplane Vol: 39.8 ml 20.45 ml/m  AORTIC VALVE AV Area (Vmax):    2.75 cm AV Area (Vmean):   2.75 cm AV Area (VTI):     3.01 cm AV Vmax:           99.90 cm/s AV Vmean:          65.200 cm/s AV VTI:            0.108 m AV Peak Grad:      4.0 mmHg AV Mean Grad:      2.0 mmHg LVOT Vmax:         79.30 cm/s LVOT Vmean:        51.800 cm/s LVOT VTI:          0.094 m LVOT/AV VTI ratio: 0.87  AORTA Ao Root diam: 3.10 cm MITRAL VALVE               TRICUSPID VALVE MV Area (PHT): 4.96 cm    TR Peak grad:   22.1 mmHg MV Decel Time: 153 msec    TR Vmax:        235.00 cm/s MV E velocity: 44.10 cm/s MV A velocity: 66.40 cm/s  SHUNTS  MV E/A ratio:  0.66        Systemic VTI:  0.09 m                            Systemic Diam: 2.10 cm Lyman Bishop MD Electronically signed by Lyman Bishop MD Signature Date/Time: 03/30/2022/10:54:45 AM    Final    DG Chest Port 1 View  Result Date: 03/30/2022 CLINICAL DATA:  Respiratory failure EXAM: PORTABLE CHEST 1 VIEW COMPARISON:  03/30/2022 FINDINGS: The endotracheal tube seen 4.0 cm  above the carina. Nasogastric tube extends into the mid body of the stomach beyond the margin of the examination. Right internal jugular temporary hemodialysis catheter tip noted within the superior right atrium. Lung volumes are extremely small, but appears stable since prior examination. Bibasilar opacification is present suggesting the presence of small bilateral pleural effusions, left greater than right. No pneumothorax. Cardiac size within normal limits. Pulmonary vascularity is normal. No acute bone abnormality. IMPRESSION: 1. Support tubes in appropriate position. 2. Marked pulmonary hypoinflation. 3. Suspected small bilateral pleural effusions, left greater than right. Electronically Signed   By: Fidela Salisbury M.D.   On: 03/30/2022 03:34   DG CHEST PORT 1 VIEW  Result Date: 03/30/2022 CLINICAL DATA:  Intubation film, check tube placement. EXAM: PORTABLE CHEST 1 VIEW COMPARISON:  Portable chest earlier today at 6:05 p.m. FINDINGS: 11:36 p.m. ETT is in place with tip 1.2 cm from carina. Suggest withdrawing the tube 3 cm to a mid tracheal positioning to avoid right main bronchus intubation. There is a right IJ double-lumen catheter with its tip in the upper right atrium. There is no pneumothorax. Standard NGT is in place with the tip in the body of the stomach. The lungs are expiratory with basilar atelectasis and limited view of the bases. Visualized lung fields are clear of infiltrate. There is mild cardiomegaly. No vascular congestion or edema is seen. Mild aortic tortuosity and ectasia.  There is thoracic spondylosis. IMPRESSION: 1. ETT tip is 1.2 cm from the carina, and could be withdrawn 3 cm to a mid tracheal positioning to lessen the likelihood of right main bronchus intubation if desired. 2. Other support apparatus as above. 3. Expiratory exam with basilar atelectasis and limited view of the bases. 4. Cardiomegaly without CHF findings. Electronically Signed   By: Telford Nab M.D.   On:  03/30/2022 00:39   CT Angio Abd/Pel W and/or Wo Contrast  Result Date: 03/30/2022 CLINICAL DATA:  Acute onset of severe abdominal pain, nausea and vomiting, and diarrhea. Clinical suspicion for bowel ischemia. Previous diverticulitis. EXAM: CTA ABDOMEN AND PELVIS WITHOUT AND WITH CONTRAST TECHNIQUE: Multidetector CT imaging of the abdomen and pelvis was performed using the standard protocol during bolus administration of intravenous contrast. Multiplanar reconstructed images and MIPs were obtained and reviewed to evaluate the vascular anatomy. RADIATION DOSE REDUCTION: This exam was performed according to the departmental dose-optimization program which includes automated exposure control, adjustment of the mA and/or kV according to patient size and/or use of iterative reconstruction technique. CONTRAST:  20m OMNIPAQUE IOHEXOL 350 MG/ML SOLN COMPARISON:  06/17/2021 FINDINGS: VASCULAR Aorta: Normal caliber aorta without aneurysm, dissection, vasculitis or significant stenosis. Celiac: Patent without evidence of aneurysm, dissection, vasculitis or significant stenosis. SMA: Patent without evidence of aneurysm, dissection, vasculitis or significant stenosis. Renals: Both renal arteries are patent without evidence of aneurysm, dissection, vasculitis, fibromuscular dysplasia or significant stenosis. IMA: Patent. Inflow: Patent without evidence of aneurysm, dissection, vasculitis or  significant stenosis. Proximal Outflow: Bilateral common femoral and visualized portions of the superficial and profunda femoral arteries are patent without evidence of aneurysm, dissection, vasculitis or significant stenosis. Veins: No obvious venous abnormality within the limitations of this arterial phase study. Review of the MIP images confirms the above findings. NON-VASCULAR Lower Chest: Bilateral lower lobe infiltrates. No evidence of pleural effusion. Hepatobiliary: No hepatic masses identified. Gallbladder is unremarkable. No  evidence of biliary ductal dilatation. Pancreas:  No mass or inflammatory changes. Spleen: Within normal limits in size and appearance. Adrenals/Urinary Tract: No suspicious masses identified. No evidence of ureteral calculi or hydronephrosis. Stomach/Bowel: Moderate amount of free intraperitoneal air is seen, consistent with bowel perforation. Moderate sigmoid diverticulitis is seen with adjacent extraluminal air bubbles, consistent with perforation. Adjacent diverticular abscess containing gas and fluid is also seen measuring 4.2 x 4.1 cm. Vascular/Lymphatic: No pathologically enlarged lymph nodes. No acute vascular findings. Reproductive: Prior hysterectomy noted. Adnexal regions are unremarkable in appearance. Other:  Small left inguinal hernia is seen which contains only fat. Musculoskeletal:  No suspicious bone lesions identified. IMPRESSION: No evidence of mesenteric ischemia or other acute vascular pathology. Perforated sigmoid diverticulitis, with moderate amount of free intraperitoneal air. 4.2 cm sigmoid diverticular abscess. Bilateral lower lobe infiltrates. Small left inguinal hernia, which contains only fat. Critical Value/emergent results were called by telephone at the time of interpretation on 04/03/2022 at 6:38 pm to provider [facility] , who verbally acknowledged these results. Electronically Signed   By: Marlaine Hind M.D.   On: 03/10/2022 18:41   DG Chest Port 1 View  Result Date: 03/16/2022 CLINICAL DATA:  Questionable sepsis. EXAM: PORTABLE CHEST 1 VIEW COMPARISON:  Nov 08, 2020 FINDINGS: Tortuosity of the thoracic aorta. Cardiomediastinal silhouette is normal. Mediastinal contours appear intact. There is no evidence of lobar airspace consolidation, pleural effusion or pneumothorax. Low lung volumes. Bibasilar atelectasis versus peribronchial airspace consolidation. Osseous structures are without acute abnormality. Soft tissues are grossly normal. IMPRESSION: Low lung volumes with  bibasilar atelectasis versus peribronchial airspace consolidation. Electronically Signed   By: Fidela Salisbury M.D.   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[place]) 12/25/2019   Cecal diverticulitis 12/25/2019   Rheumatoid arthritis ([place]) 12/25/2019   GERD (gastroesophageal reflux disease) 12/25/2019   Essential hypertension 12/25/2019   AKI (acute kidney injury) ([place]) 12/25/2019   Hyperglycemia 12/25/2019   Chronic radicular lumbar pain 12/25/2019   Generalized weakness 12/25/2019   Anemia of chronic renal failure, stage 3 (moderate) ([place]) 12/30/2018   Lumbar disc herniation 03/04/2016   Iron deficiency anemia 06/21/2015   Malabsorption of iron 06/21/2015   Morbid obesity ([place]) 04/10/2015   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  Plan for return to OR for closure once more stable Renal: bladder repair during surgery, do not remove foley.  Making little urine, ARF due to sepsis, Cont IVF's ID: on Mtx for RA, getting steroid stress dose, Flagyl and Zosyn  Plan for OR tomorrow for abd closure and ostomy creation   LOS: 2 days     .Rosario Adie, MD [facility], [place]    03/31/2022 7:58 AM

## 2022-03-31 NOTE — Progress Notes (Signed)
NAMEShantoria Hardy, MRN:  [id number], DOB:  27-Aug-1950, LOS: 2 ADMISSION DATE:  03/30/2022, CONSULTATION DATE:  9/24 REFERRING MD:  Donne Hazel, CHIEF COMPLAINT:  abdominal pain   History of Present Illness:  71 y/o female with extensive past medical history including rheumatoid arthritis on multiple immunosuppressants admitted with septic shock from perforated diverticulitis with bladder fistula requiring emergent ex-lap for sigmoid colectomy and bladder repair on 9/24.  She returned form the operating room early AM 9/25 with septic shock and remained on mechanical ventilatory support.    Pertinent  Medical History  Rheumatoid arthritis GERD Anemia of chronic disease Hyeprtension Non-hodgkin's lymphoma Prior ischemic colitis  Significant Hospital Events: Including procedures, antibiotic start and stop dates in addition to other pertinent events   9/24 admission for septic shock from perforated diverticulitis, emergent ex-lap with Dr. Donne Hazel for sigmoidectomy and bladder repair, brought to the ICU Blood culture 04/03/2022>> BCID staph species. Aureus not detected >>> Urine culture 03/06/2022>>  9/24 cefepime x 1 9/24 vanc  x 1 9/24 flagyl x 1 9/24 zosyn >> 9/25 micafungin >   Interim History / Subjective:  No acute events overnight.  Several episodes of hypertension improved with pain control  Objective   Blood pressure (!) 179/113, pulse 84, temperature 98.2 F (36.8 C), temperature source Esophageal, resp. rate 20, height 4' 11"  (1.499 m), weight 103 kg, SpO2 96 %. CVP:  [9 mmHg-11 mmHg] 9 mmHg  Vent Mode: PRVC FiO2 (%):  [30 %-50 %] 30 % Set Rate:  [20 bmp] 20 bmp Vt Set:  [340 mL] 340 mL PEEP:  [5 cmH20] 5 cmH20 Plateau Pressure:  [15 cmH20-17 cmH20] 16 cmH20   Intake/Output Summary (Last 24 hours) at 03/31/2022 0953 Last data filed at 03/31/2022 0930 Gross per 24 hour  Intake 3809.89 ml  Output 3520 ml  Net 289.89 ml    Filed Weights   03/30/2022 2212   Weight: 103 kg    Examination: General: Elderly chronically ill appearing female sedated on vent.  HENT: [place]/AT, PERRL, no JVD. ETT.  PULM: Clear bilateral breath sounds CV: RRR, no MRG GI: Wound vac with serosanguinous drainage.  MSK: No acute deformity Neuro: [facility] Problem list     Assessment & Plan:  Acute respiratory failure with hypoxemia due to septic shock - Full mechanical vent support - VAP prevention - Defer SAT/SBT today. Back to OR tomorrow for ostomy creation and abdominal closure.  - Wean down FiO2 for SaO2 > 88%  Septic shock due to perforated diverticulitis Immunocompromised Entero-vesicula fistula s/p repair On steroids, 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 - Vasopressin, levophed for MAP > 65 - Stress steroids: Hydrocortisone 163m IV q8h - Continue zosyn - Await blood culture speciation - Micafungin  S/p Ex-lap for sigmoid colectomy and bladder repair - Back to OR 9/27 for ostomy creation and abdominal closure.  - NPO - Wound vac, nutrition per primary service  Chronic pain, narcotic dependence at baseline Need for sedation for mechanical ventilation - RASS goal -2 - Fentanyl infusion  - Precedex - PAD protocol  HFrEF: Acuity uncertain. LVEF 35-45%. No prior to compare. Not hypervolemic on exam. CVP 10. Demand myocardial ischemia due to septic shock: trop down-trending. - Telemetry monitoring  Oliguric AKI Baseline CKD Metabolic acidosis from lactic acidosis from septic shock - Monitor BMET and UOP - Replace electrolytes as needed - Bicarb drip continue for now - Do not remove foley per surgery - Hopefully she can avoid HD  Anemia due  to Fe deficiency Monitor for bleeding Transfuse PRBC for Hgb < 7 gm/dL  Hyperglycemia - SSI  Prognosis guarded, high risk of death given multi-organ failure and baseline comorbid illnesses   Best Practice (right click and "Reselect all SmartList Selections" daily)    Diet/type: NPO DVT prophylaxis: prophylactic heparin  GI prophylaxis: PPI Lines: Central line and yes and it is still needed Foley:  Yes, and it is still needed Code Status:  full code Last date of multidisciplinary goals of care discussion [per primary, family updated 9/25]  Labs   CBC: Recent Labs  Lab 03/12/2022 1435 03/10/2022 1819 04/02/2022 2047 04/04/2022 2134 03/30/22 0416 03/31/22 0036  WBC 4.3 3.1*  --   --  6.8 8.4  NEUTROABS 1.3* 1.1*  --   --   --   --   HGB 12.4 11.9* 10.9* 9.5* 10.5* 8.9*  HCT 40.0 38.8 32.0* 28.0* 33.8* 28.1*  MCV 112.0* 112.8*  --   --  112.3* 108.5*  PLT 196 179  --   --  142* 118*     Basic Metabolic Panel: Recent Labs  Lab 03/16/2022 1435 03/22/2022 1819 03/13/2022 2047 04/02/2022 2134 03/30/22 0416 03/30/22 1100 03/31/22 0036  NA 145 141 141 141 140 138 143  K 4.2 4.0 4.2 4.2 4.4 4.7 4.8  CL 111 112*  --   --  114* 112* 115*  CO2 21* 21*  --   --  17* 13* 17*  GLUCOSE 138* 117*  --   --  167* 162* 84  BUN 26* 26*  --   --  26* 27* 28*  CREATININE 1.35* 1.35*  --   --  1.83* 2.03* 2.18*  CALCIUM 9.3 8.4*  --   --  7.9* 7.8* 7.7*    GFR: Estimated Creatinine Clearance: 24.7 mL/min (A) (by C-G formula based on SCr of 2.18 mg/dL (H)). Recent Labs  Lab 03/30/2022 1435 03/19/2022 1819 03/30/2022 1820 03/17/2022 2345 03/30/22 0416 03/30/22 0500 03/31/22 0036  WBC 4.3 3.1*  --   --  6.8  --  8.4  LATICACIDVEN  --   --  3.8* 3.9*  --  3.4*  --      Liver Function Tests: Recent Labs  Lab 03/09/2022 1435 03/30/22 0416 03/31/22 0036  AST 25 39 56*  ALT 17 23 32  ALKPHOS 72 52 49  BILITOT 0.7 0.7 1.3*  PROT 7.5 4.8* 5.6*  ALBUMIN 3.6 2.2* 3.0*    Recent Labs  Lab 03/21/2022 1435  LIPASE 22    No results for input(s): "AMMONIA" in the last 168 hours.  ABG    Component Value Date/Time   PHART 7.33 (L) 03/31/2022 0036   PCO2ART 32 03/31/2022 0036   PO2ART 100 03/31/2022 0036   HCO3 17.0 (L) 03/31/2022 0036   TCO2 16 (L)  03/11/2022 2134   ACIDBASEDEF 8.3 (H) 03/31/2022 0036   O2SAT 98.7 03/31/2022 0036     Coagulation Profile: Recent Labs  Lab 03/25/2022 1819  INR 1.3*     Cardiac Enzymes: No results for input(s): "CKTOTAL", "CKMB", "CKMBINDEX", "TROPONINI" in the last 168 hours.  HbA1C: Hgb A1c MFr Bld  Date/Time Value Ref Range Status  03/30/2022 09:30 AM 5.5 4.8 - 5.6 % Final    Comment:    (NOTE) Pre diabetes:          5.7%-6.4%  Diabetes:              >6.4%  Glycemic control for   <7.0% adults with  diabetes   12/26/2019 05:30 AM 5.7 (H) 4.8 - 5.6 % Final    Comment:    (NOTE)         Prediabetes: 5.7 - 6.4         Diabetes: >6.4         Glycemic control for adults with diabetes: <7.0     CBG: Recent Labs  Lab 03/30/22 1602 03/30/22 1929 03/30/22 2341 03/31/22 0323 03/31/22 0755  GLUCAP 106* 103* 70 91 111*    Critical care time: 48 minutes     Georgann Housekeeper, AGACNP-BC [facility]  See Amion for personal pager PCCM on call pager [phone] until 7pm. Please call Elink 7p-7a. [phone]  03/31/2022 10:35 AM

## 2022-03-31 NOTE — Telephone Encounter (Signed)
Called patient and lvm about rescheduling appointment with Judson Roch - requested callback to confirm

## 2022-04-01 ENCOUNTER — Inpatient Hospital Stay (HOSPITAL_COMMUNITY): Payer: Medicare Other | Admitting: Anesthesiology

## 2022-04-01 ENCOUNTER — Encounter (HOSPITAL_COMMUNITY): Admission: EM | Disposition: E | Payer: Self-pay | Source: Home / Self Care | Attending: Pulmonary Disease

## 2022-04-01 ENCOUNTER — Inpatient Hospital Stay (HOSPITAL_COMMUNITY): Payer: Medicare Other

## 2022-04-01 ENCOUNTER — Other Ambulatory Visit: Payer: Self-pay

## 2022-04-01 DIAGNOSIS — M1990 Unspecified osteoarthritis, unspecified site: Secondary | ICD-10-CM

## 2022-04-01 DIAGNOSIS — T8189XA Other complications of procedures, not elsewhere classified, initial encounter: Secondary | ICD-10-CM | POA: Diagnosis not present

## 2022-04-01 DIAGNOSIS — K668 Other specified disorders of peritoneum: Secondary | ICD-10-CM | POA: Diagnosis not present

## 2022-04-01 DIAGNOSIS — I129 Hypertensive chronic kidney disease with stage 1 through stage 4 chronic kidney disease, or unspecified chronic kidney disease: Secondary | ICD-10-CM | POA: Diagnosis not present

## 2022-04-01 DIAGNOSIS — N183 Chronic kidney disease, stage 3 unspecified: Secondary | ICD-10-CM

## 2022-04-01 HISTORY — PX: LAPAROTOMY: SHX154

## 2022-04-01 HISTORY — PX: OSTOMY: SHX5997

## 2022-04-01 HISTORY — PX: WOUND DEBRIDEMENT: SHX247

## 2022-04-01 LAB — BLOOD GAS, ARTERIAL
Acid-base deficit: 2.7 mmol/L — ABNORMAL HIGH (ref 0.0–2.0)
Bicarbonate: 21.7 mmol/L (ref 20.0–28.0)
Drawn by: 23281
FIO2: 30 %
MECHVT: 340 mL
O2 Saturation: 97.4 %
PEEP: 5 cmH2O
Patient temperature: 37.3
RATE: 20 resp/min
pCO2 arterial: 35 mmHg (ref 32–48)
pH, Arterial: 7.4 (ref 7.35–7.45)
pO2, Arterial: 91 mmHg (ref 83–108)

## 2022-04-01 LAB — GLUCOSE, CAPILLARY
Glucose-Capillary: 135 mg/dL — ABNORMAL HIGH (ref 70–99)
Glucose-Capillary: 144 mg/dL — ABNORMAL HIGH (ref 70–99)
Glucose-Capillary: 160 mg/dL — ABNORMAL HIGH (ref 70–99)
Glucose-Capillary: 142 mg/dL — ABNORMAL HIGH (ref 70–99)
Glucose-Capillary: 175 mg/dL — ABNORMAL HIGH (ref 70–99)
Glucose-Capillary: 146 mg/dL — ABNORMAL HIGH (ref 70–99)

## 2022-04-01 LAB — CBC
HCT: 24.9 % — ABNORMAL LOW (ref 36.0–46.0)
Hemoglobin: 8.1 g/dL — ABNORMAL LOW (ref 12.0–15.0)
MCH: 35.1 pg — ABNORMAL HIGH (ref 26.0–34.0)
MCHC: 32.5 g/dL (ref 30.0–36.0)
MCV: 107.8 fL — ABNORMAL HIGH (ref 80.0–100.0)
Platelets: 106 10*3/uL — ABNORMAL LOW (ref 150–400)
RBC: 2.31 MIL/uL — ABNORMAL LOW (ref 3.87–5.11)
RDW: 16.9 % — ABNORMAL HIGH (ref 11.5–15.5)
WBC: 12.3 10*3/uL — ABNORMAL HIGH (ref 4.0–10.5)
nRBC: 0.8 % — ABNORMAL HIGH (ref 0.0–0.2)

## 2022-04-01 LAB — PREPARE RBC (CROSSMATCH)

## 2022-04-01 LAB — COMPREHENSIVE METABOLIC PANEL
ALT: 45 U/L — ABNORMAL HIGH (ref 0–44)
AST: 59 U/L — ABNORMAL HIGH (ref 15–41)
Albumin: 2.3 g/dL — ABNORMAL LOW (ref 3.5–5.0)
Alkaline Phosphatase: 63 U/L (ref 38–126)
Anion gap: 7 (ref 5–15)
BUN: 35 mg/dL — ABNORMAL HIGH (ref 8–23)
CO2: 23 mmol/L (ref 22–32)
Calcium: 7.1 mg/dL — ABNORMAL LOW (ref 8.9–10.3)
Chloride: 113 mmol/L — ABNORMAL HIGH (ref 98–111)
Creatinine, Ser: 1.79 mg/dL — ABNORMAL HIGH (ref 0.44–1.00)
GFR, Estimated: 30 mL/min — ABNORMAL LOW (ref >60)
Glucose, Bld: 155 mg/dL — ABNORMAL HIGH (ref 70–99)
Potassium: 3.8 mmol/L (ref 3.5–5.1)
Sodium: 143 mmol/L (ref 135–145)
Total Bilirubin: 1 mg/dL (ref 0.3–1.2)
Total Protein: 5 g/dL — ABNORMAL LOW (ref 6.5–8.1)

## 2022-04-01 LAB — CULTURE, BLOOD (ROUTINE X 2): Special Requests: ADEQUATE

## 2022-04-01 LAB — HEMOGLOBIN AND HEMATOCRIT, BLOOD
HCT: 28 % — ABNORMAL LOW (ref 36.0–46.0)
Hemoglobin: 9.2 g/dL — ABNORMAL LOW (ref 12.0–15.0)

## 2022-04-01 LAB — MAGNESIUM: Magnesium: 1.8 mg/dL (ref 1.7–2.4)

## 2022-04-01 LAB — PHOSPHORUS: Phosphorus: 3.8 mg/dL (ref 2.5–4.6)

## 2022-04-01 LAB — SURGICAL PATHOLOGY

## 2022-04-01 SURGERY — LAPAROTOMY, EXPLORATORY
Anesthesia: General | Site: Abdomen

## 2022-04-01 MED ORDER — DEXMEDETOMIDINE HCL IN NACL 400 MCG/100ML IV SOLN
0.4000 ug/kg/h | INTRAVENOUS | Status: DC
  Administered 2022-04-01: 1.2 ug/kg/h via INTRAVENOUS
  Administered 2022-04-01 (×4): 1.1 ug/kg/h via INTRAVENOUS
  Administered 2022-04-02 – 2022-04-07 (×38): 1.2 ug/kg/h via INTRAVENOUS
  Filled 2022-04-01 (×44): qty 100

## 2022-04-01 MED ORDER — DEXTROSE 5 % IV SOLN: INTRAVENOUS | Status: DC

## 2022-04-01 MED ORDER — SODIUM CHLORIDE 0.9 % IV SOLN: 10.0000 mL/h | Freq: Once | INTRAVENOUS | Status: DC

## 2022-04-01 MED ORDER — LACTATED RINGERS IV SOLN: INTRAVENOUS | Status: DC | PRN

## 2022-04-01 MED ORDER — MAGNESIUM SULFATE 2 GM/50ML IV SOLN
2.0000 g | Freq: Once | INTRAVENOUS | Status: AC
  Administered 2022-04-01: 2 g via INTRAVENOUS
  Filled 2022-04-01: qty 50

## 2022-04-01 MED ORDER — 0.9 % SODIUM CHLORIDE (POUR BTL) OPTIME
TOPICAL | Status: DC | PRN
  Administered 2022-04-01 (×2): 2000 mL

## 2022-04-01 MED ORDER — ROCURONIUM BROMIDE 10 MG/ML (PF) SYRINGE
PREFILLED_SYRINGE | INTRAVENOUS | Status: DC | PRN
  Administered 2022-04-01: 100 mg via INTRAVENOUS

## 2022-04-01 MED ORDER — ONDANSETRON HCL 4 MG/2ML IJ SOLN
INTRAMUSCULAR | Status: DC | PRN
  Administered 2022-04-01: 4 mg via INTRAVENOUS

## 2022-04-01 MED ORDER — PROPOFOL 10 MG/ML IV BOLUS
INTRAVENOUS | Status: AC
  Filled 2022-04-01: qty 20

## 2022-04-01 MED ORDER — DEXTROSE IN LACTATED RINGERS 5 % IV SOLN: INTRAVENOUS | Status: DC

## 2022-04-01 MED ORDER — DEXAMETHASONE SODIUM PHOSPHATE 10 MG/ML IJ SOLN
INTRAMUSCULAR | Status: DC | PRN
  Administered 2022-04-01: 10 mg via INTRAVENOUS

## 2022-04-01 MED ORDER — FENTANYL CITRATE (PF) 100 MCG/2ML IJ SOLN
INTRAMUSCULAR | Status: AC
  Filled 2022-04-01: qty 2

## 2022-04-01 MED ORDER — SODIUM CHLORIDE 0.9 % IV SOLN: INTRAVENOUS | Status: DC | PRN

## 2022-04-01 MED ORDER — ROCURONIUM BROMIDE 10 MG/ML (PF) SYRINGE
PREFILLED_SYRINGE | INTRAVENOUS | Status: AC
  Filled 2022-04-01: qty 10

## 2022-04-01 MED ORDER — POTASSIUM CHLORIDE 10 MEQ/50ML IV SOLN
10.0000 meq | INTRAVENOUS | Status: AC
  Administered 2022-04-01 (×2): 10 meq via INTRAVENOUS
  Filled 2022-04-01 (×2): qty 50

## 2022-04-01 SURGICAL SUPPLY — 45 items
APL PRP STRL LF DISP 70% ISPRP (MISCELLANEOUS) ×1
BAG COUNTER SPONGE SURGICOUNT (BAG) IMPLANT
BAG SPNG CNTER NS LX DISP (BAG)
BLADE EXTENDED COATED 6.5IN (ELECTRODE) IMPLANT
CELLS DAT CNTRL 66122 CELL SVR (MISCELLANEOUS) IMPLANT
CHLORAPREP W/TINT 26 (MISCELLANEOUS) ×2 IMPLANT
DRAIN CHANNEL 19F RND (DRAIN) IMPLANT
DRAPE LAPAROSCOPIC ABDOMINAL (DRAPES) ×2 IMPLANT
DRAPE SHEET LG 3/4 BI-LAMINATE (DRAPES) IMPLANT
DRSG OPSITE POSTOP 4X10 (GAUZE/BANDAGES/DRESSINGS) IMPLANT
DRSG OPSITE POSTOP 4X6 (GAUZE/BANDAGES/DRESSINGS) IMPLANT
DRSG OPSITE POSTOP 4X8 (GAUZE/BANDAGES/DRESSINGS) IMPLANT
DRSG VAC GRANUFOAM LG (GAUZE/BANDAGES/DRESSINGS) IMPLANT
ELECT REM PT RETURN 15FT ADLT (MISCELLANEOUS) ×2 IMPLANT
EVACUATOR SILICONE 100CC (DRAIN) IMPLANT
GAUZE SPONGE 4X4 12PLY STRL (GAUZE/BANDAGES/DRESSINGS) IMPLANT
GLOVE BIO SURGEON STRL SZ 6.5 (GLOVE) ×4 IMPLANT
GLOVE BIOGEL PI IND STRL 7.0 (GLOVE) ×4 IMPLANT
GLOVE INDICATOR 6.5 STRL GRN (GLOVE) ×2 IMPLANT
GOWN STRL REUS W/ TWL XL LVL3 (GOWN DISPOSABLE) ×6 IMPLANT
GOWN STRL REUS W/TWL XL LVL3 (GOWN DISPOSABLE) ×3
HANDLE SUCTION POOLE (INSTRUMENTS) IMPLANT
KIT TURNOVER KIT A (KITS) IMPLANT
LEGGING LITHOTOMY PAIR STRL (DRAPES) IMPLANT
PACK COLON (CUSTOM PROCEDURE TRAY) ×2 IMPLANT
PENCIL SMOKE EVACUATOR (MISCELLANEOUS) IMPLANT
RETRACTOR WND ALEXIS 18 MED (MISCELLANEOUS) IMPLANT
RETRACTOR WND ALEXIS 25 LRG (MISCELLANEOUS) IMPLANT
RTRCTR WOUND ALEXIS 18CM MED (MISCELLANEOUS)
RTRCTR WOUND ALEXIS 25CM LRG (MISCELLANEOUS)
STAPLER VISISTAT 35W (STAPLE) ×2 IMPLANT
SUCTION POOLE HANDLE (INSTRUMENTS)
SUT ETHILON 3 0 PS 1 (SUTURE) IMPLANT
SUT NOVA 1 T20/GS 25DT (SUTURE) ×4 IMPLANT
SUT SILK 2 0 (SUTURE) ×1
SUT SILK 2 0 SH CR/8 (SUTURE) ×2 IMPLANT
SUT SILK 2-0 18XBRD TIE 12 (SUTURE) ×2 IMPLANT
SUT SILK 3 0 (SUTURE) ×1
SUT SILK 3 0 SH CR/8 (SUTURE) ×2 IMPLANT
SUT SILK 3-0 18XBRD TIE 12 (SUTURE) ×2 IMPLANT
SUT VIC AB 2-0 SH 18 (SUTURE) ×2 IMPLANT
TOWEL OR 17X26 10 PK STRL BLUE (TOWEL DISPOSABLE) IMPLANT
TOWEL OR NON WOVEN STRL DISP B (DISPOSABLE) ×2 IMPLANT
TRAY FOLEY MTR SLVR 16FR STAT (SET/KITS/TRAYS/PACK) ×2 IMPLANT
TUBING CONNECTING 10 (TUBING) ×4 IMPLANT

## 2022-04-01 NOTE — Progress Notes (Signed)
Day of Surgery ex lap, Sigmoidectomy, bladder repair, open abdominal vac placement Subjective:  Pressors requirement decreased, intubated, sedated, making more urine  Objective: Vital signs in last 24 hours: Temp:  [98.7 F (37.1 C)-99.7 F (37.6 C)] 99.7 F (37.6 C) (09/27 1000) Pulse Rate:  [71-117] 71 (09/27 0749) Resp:  [15-21] 20 (09/27 1000) BP: (93-122)/(56-88) 107/79 (09/27 0749) SpO2:  [82 %-100 %] 100 % (09/27 0749) Arterial Line BP: (88-175)/(53-99) 116/63 (09/27 1000) FiO2 (%):  [30 %] 30 % (09/27 0749)   Intake/Output from previous day: 09/26 0701 - 09/27 0700 In: 2572.1 [I.V.:2166.3; IV Piggyback:405.9] Out: 1335 [Urine:610; Emesis/NG output:175; Drains:550] Intake/Output this shift: Total I/O In: 864.2 [I.V.:593.6; NG/GT:30; IV Piggyback:240.7] Out: 325 [Urine:325]   General appearance: alert and cooperative GI: vac intact    Lab Results:  Recent Labs    03/31/22 0036 03/31/22 1444 03/07/2022 0341  WBC 8.4  --  12.3*  HGB 8.9* 8.4* 8.1*  HCT 28.1* 25.8* 24.9*  PLT 118*  --  106*    BMET Recent Labs    03/31/22 1444 03/24/2022 0341  NA 142 143  K 3.8 3.8  CL 114* 113*  CO2 20* 23  GLUCOSE 140* 155*  BUN 31* 35*  CREATININE 1.86* 1.79*  CALCIUM 7.3* 7.1*    PT/INR Recent Labs    03/08/2022 1819  LABPROT 15.8*  INR 1.3*    ABG Recent Labs    03/31/22 0036 03/22/2022 0408  PHART 7.33* 7.4  HCO3 17.0* 21.7     MEDS, Scheduled  Chlorhexidine Gluconate Cloth  6 each Topical Daily   docusate  100 mg Per Tube BID   heparin flush  10 Units Intracatheter Once   heparin  5,000 Units Subcutaneous Q8H   hydrocortisone sod succinate (SOLU-CORTEF) inj  100 mg Intravenous Q8H   influenza vaccine adjuvanted  0.5 mL Intramuscular Tomorrow-1000   insulin aspart  0-20 Units Subcutaneous Q4H   mupirocin ointment  1 Application Nasal BID   mouth rinse  15 mL Mouth Rinse Q2H   pantoprazole (PROTONIX) IV  40 mg Intravenous Q24H   pneumococcal  20-valent conjugate vaccine  0.5 mL Intramuscular Tomorrow-1000    Studies/Results: No results found.  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[place]) 12/25/2019   Cecal diverticulitis 12/25/2019   Rheumatoid arthritis ([place]) 12/25/2019   GERD (gastroesophageal reflux disease) 12/25/2019   Essential hypertension 12/25/2019   Acute renal failure ([place]) 12/25/2019   Hyperglycemia 12/25/2019   Chronic radicular lumbar pain 12/25/2019   Generalized weakness 12/25/2019   Anemia of chronic renal failure, stage 3 (moderate) ([place]) 12/30/2018   Lumbar disc herniation 03/04/2016   Iron deficiency anemia 06/21/2015   Malabsorption of iron 06/21/2015   Morbid obesity ([place]) 04/10/2015   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  Plan for return to OR for closure today with ostomy creation  Renal: bladder repair during surgery, do not remove foley.  Making little urine, ARF due to sepsis, Cont IVF's ID: on Mtx for RA, getting steroid stress dose, Flagyl and Zosyn   OR today for closure and second look.  Risks include bleeding, infection, damage to adjacent structures and need for additional procedures and surgery.   LOS: 3 days     .Rosario Adie, MD [facility], [place]    03/28/2022 11:55 AM

## 2022-04-01 NOTE — Consult Note (Signed)
WOC consulted for NPWT and new colostomy, surgery 03/20/2022. [facility] nursing team will follow up for same.   [place], [place], [place]

## 2022-04-01 NOTE — Transfer of Care (Signed)
Immediate Anesthesia Transfer of Care Note  Patient: Helen Hardy  Procedure(s) Performed: EXPLORATORY LAPAROTOMY (Abdomen) OSTOMY (Abdomen) DEBRIDEMENT CLOSURE/ABDOMINAL WOUND (Abdomen)  Patient Location: ICU  Anesthesia Type:General  Level of Consciousness: Patient remains intubated per anesthesia plan  Airway & Oxygen Therapy: Patient remains intubated per anesthesia plan and Patient placed on Ventilator (see vital sign flow sheet for setting)  Post-op Assessment: Report given to RN and Post -op Vital signs reviewed and stable  Post vital signs: Reviewed and stable  Last Vitals:  Vitals Value Taken Time  BP    Temp    Pulse    Resp    SpO2      Last Pain:  Vitals:   03/31/2022 0812  TempSrc: Axillary  PainSc:          Complications: No notable events documented.

## 2022-04-01 NOTE — Anesthesia Preprocedure Evaluation (Addendum)
Anesthesia Evaluation  Patient identified by MRN, date of birth, ID band Patient unresponsive    Reviewed: Allergy & Precautions, NPO status , Patient's Chart, lab work & pertinent test results  Airway Mallampati: Intubated       Dental  (+) Edentulous Upper   Pulmonary neg pulmonary ROS,    Pulmonary exam normal breath sounds clear to auscultation       Cardiovascular hypertension, Pt. on medications  Rhythm:Regular Rate:Normal     Neuro/Psych negative neurological ROS  negative psych ROS   GI/Hepatic Neg liver ROS, GERD  Medicated and Controlled,perf bowel, free air- emergency laparotomy 9/24 Hx ischemic colitis, diverticulosis in past  Now w/ septic shock in ICU, remains intubated and sedated- to OR Today for ostomy creation and possible abdominal closure    Endo/Other  negative endocrine ROS  Renal/GU CRFRenal diseasenegative Renal ROSckd3  negative genitourinary   Musculoskeletal  (+) Arthritis , Rheumatoid disorders,    Abdominal (+) + obese,   Peds negative pediatric ROS (+)  Hematology negative hematology ROS (+)   Anesthesia Other Findings   Reproductive/Obstetrics negative OB ROS                            Anesthesia Physical Anesthesia Plan  ASA: 3  Anesthesia Plan: General   Post-op Pain Management:    Induction: Intravenous and Inhalational  PONV Risk Score and Plan: 3 and Treatment may vary due to age or medical condition  Airway Management Planned: Oral ETT  Additional Equipment: None  Intra-op Plan:   Post-operative Plan: Post-operative intubation/ventilation  Informed Consent:   Plan Discussed with:   Anesthesia Plan Comments: (Access: RIJ TLC, R fem art line, LAC 20G PIV  Vent: PRVC TV 340, R 20, PEEP 5, 30% FiO2 Stable off pressors, will likely restart levophed temporarily while anesthestized Starting Hb this AM 8.1, will start one unit prbc and  check istat)       Anesthesia Quick Evaluation

## 2022-04-01 NOTE — Progress Notes (Signed)
NAMEAzilee Hardy, MRN:  [id number], DOB:  01/24/1951, LOS: 3 ADMISSION DATE:  03/30/2022, CONSULTATION DATE:  9/24 REFERRING MD:  Donne Hazel, CHIEF COMPLAINT:  abdominal pain   History of Present Illness:  71 y/o female with extensive past medical history including rheumatoid arthritis on multiple immunosuppressants admitted with septic shock from perforated diverticulitis with bladder fistula requiring emergent ex-lap for sigmoid colectomy and bladder repair on 9/24.  She returned form the operating room early AM 9/25 with septic shock and remained on mechanical ventilatory support.    Pertinent  Medical History  Rheumatoid arthritis GERD Anemia of chronic disease Hyeprtension Non-hodgkin's lymphoma Prior ischemic colitis  Significant Hospital Events: Including procedures, antibiotic start and stop dates in addition to other pertinent events   9/24 admission for septic shock from perforated diverticulitis, emergent ex-lap with Dr. Donne Hazel for sigmoidectomy and bladder repair, brought to the ICU Blood culture 03/15/2022>> BCID staph species. Aureus not detected >>> Urine culture 03/28/2022>>  9/24 cefepime x 1 9/24 vanc  x 1 9/24 flagyl x 1 9/24 zosyn >> 9/25 micafungin >  9/27 BC with staph hominis in aerobic bottle only. Likely contaminant.  Interim History / Subjective:  No acute events overnight. For OR return today.   Objective   Blood pressure 107/79, pulse 71, temperature 98.7 F (37.1 C), temperature source Axillary, resp. rate 20, height 4' 11"  (1.499 m), weight 103 kg, SpO2 100 %. CVP:  [9 mmHg-10 mmHg] 10 mmHg  Vent Mode: PRVC FiO2 (%):  [30 %] 30 % Set Rate:  [20 bmp] 20 bmp Vt Set:  [340 EQ-683419 mL] 622297 mL PEEP:  [5 cmH20] 5 cmH20 Plateau Pressure:  [14 cmH20-16 cmH20] 15 cmH20   Intake/Output Summary (Last 24 hours) at 04/02/2022 0845 Last data filed at 03/17/2022 0844 Gross per 24 hour  Intake 2796.48 ml  Output 1115 ml  Net 1681.48 ml     Filed Weights   03/15/2022 2212  Weight: 103 kg    Examination: General: Elderly female on vent HENT: /AT, PERRL, no JVD PULM: Clear bilateral breath sounds CV: RRR, no MRG GI: Wound vac with serosanguinous drainage.  MSK: No acute deformity Neuro: Sedated RASS -4.   Resolved Hospital Problem list     Assessment & Plan:  Acute respiratory failure with hypoxemia due to septic shock - Full mechanical vent support - VAP prevention - Will consider SAT/SBT this afternoon or tomorrow morning after surgery.  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  - Await blood culture speciation > staph hominis in aerobic bottle only. Likely contaminant.   S/p Ex-lap for sigmoid colectomy and bladder repair - Back to OR 9/27 for ostomy creation and abdominal closure.  - NPO - Wound vac, nutrition per primary service  Chronic pain, narcotic dependence at baseline Need for sedation for mechanical ventilation - RASS goal -4 - Fentanyl infusion  - Precedex - PAD protocol  HFrEF: Acuity uncertain. LVEF 35-45%. No prior to compare. Not hypervolemic on exam. CVP 10. Perhaps in the setting of sepsis.  Demand myocardial ischemia due to septic shock: trop down-trending. - Telemetry monitoring - Will need additional workup once more stable.   Oliguric AKI Baseline CKD Metabolic acidosis from lactic acidosis from septic shock -  Monitor BMET and UOP - Replace electrolytes as needed - DC bicarb, transition back to d5 LR - Do not remove foley per surgery - Hopefully she can avoid HD, creatinine slowly improving. Remains oliguric.   Anemia due to Fe deficiency -Monitor  for bleeding -Transfuse PRBC for Hgb < 7 gm/dL  Hyperglycemia - SSI  Elevated transaminases: mild, but worsening.  - Follow LFT.     Best Practice (right click and "Reselect all SmartList Selections" daily)   Diet/type: NPO DVT prophylaxis: prophylactic heparin  GI prophylaxis: PPI Lines: Central line and yes and it is still needed Foley:  Yes, and it is still needed Code Status:  full code Last date of multidisciplinary goals of care discussion [per primary, family updated 9/25]  Labs   CBC: Recent Labs  Lab 03/18/2022 1435 04/02/2022 1819 04/02/2022 2047 03/22/2022 2134 03/30/22 0416 03/31/22 0036 03/31/22 1444 04/02/2022 0341  WBC 4.3 3.1*  --   --  6.8 8.4  --  12.3*  NEUTROABS 1.3* 1.1*  --   --   --   --   --   --   HGB 12.4 11.9*   < > 9.5* 10.5* 8.9* 8.4* 8.1*  HCT 40.0 38.8   < > 28.0* 33.8* 28.1* 25.8* 24.9*  MCV 112.0* 112.8*  --   --  112.3* 108.5*  --  107.8*  PLT 196 179  --   --  142* 118*  --  106*   < > = values in this interval not displayed.     Basic Metabolic Panel: Recent Labs  Lab 03/30/22 0416 03/30/22 1100 03/31/22 0036 03/31/22 1444 03/14/2022 0341  NA 140 138 143 142 143  K 4.4 4.7 4.8 3.8 3.8  CL 114* 112* 115* 114* 113*  CO2 17* 13* 17* 20* 23  GLUCOSE 167* 162* 84 140* 155*  BUN 26* 27* 28* 31* 35*  CREATININE 1.83* 2.03* 2.18* 1.86* 1.79*  CALCIUM 7.9* 7.8* 7.7* 7.3* 7.1*  MG  --   --   --   --  1.8  PHOS  --   --   --   --  3.8    GFR: Estimated Creatinine Clearance: 30.1 mL/min (A) (by C-G formula based on SCr of 1.79 mg/dL (H)). Recent Labs  Lab 04/03/2022 1819 03/15/2022 1820 03/27/2022 2345 03/30/22 0416 03/30/22 0500 03/31/22 0036 03/23/2022 0341  WBC 3.1*  --   --  6.8  --  8.4 12.3*  LATICACIDVEN  --  3.8* 3.9*  --  3.4*  --   --      Liver Function Tests: Recent Labs  Lab 03/26/2022 1435 03/30/22 0416 03/31/22 0036 03/07/2022 0341  AST 25 39 56* 59*  ALT 17 23 32 45*  ALKPHOS 72 52 49 63  BILITOT 0.7 0.7 1.3* 1.0   PROT 7.5 4.8* 5.6* 5.0*  ALBUMIN 3.6 2.2* 3.0* 2.3*    Recent Labs  Lab 03/08/2022 1435  LIPASE 22    No results for input(s): "AMMONIA" in the last 168 hours.  ABG    Component Value Date/Time   PHART 7.4 03/12/2022 0408   PCO2ART 35 03/15/2022 0408   PO2ART 91 03/25/2022 0408   HCO3 21.7 03/24/2022 0408   TCO2 16 (L) 03/17/2022 2134   ACIDBASEDEF 2.7 (H) 03/13/2022 0408   O2SAT 97.4 04/04/2022 0408     Coagulation Profile: Recent Labs  Lab 03/17/2022 1819  INR 1.3*     Cardiac Enzymes: No results for input(s): "CKTOTAL", "CKMB", "CKMBINDEX", "TROPONINI"  in the last 168 hours.  HbA1C: Hgb A1c MFr Bld  Date/Time Value Ref Range Status  03/30/2022 09:30 AM 5.5 4.8 - 5.6 % Final    Comment:    (NOTE) Pre diabetes:          5.7%-6.4%  Diabetes:              >6.4%  Glycemic control for   <7.0% adults with diabetes   12/26/2019 05:30 AM 5.7 (H) 4.8 - 5.6 % Final    Comment:    (NOTE)         Prediabetes: 5.7 - 6.4         Diabetes: >6.4         Glycemic control for adults with diabetes: <7.0     CBG: Recent Labs  Lab 03/31/22 1611 03/31/22 1942 03/31/22 2327 03/16/2022 0325 03/27/2022 0830  GLUCAP 131* 116* 110* 135* 144*    Critical care time: 39 minutes     Georgann Housekeeper, AGACNP-BC [facility]  See Amion for personal pager PCCM on call pager [phone] until 7pm. Please call Elink 7p-7a. [phone]  03/06/2022 8:45 AM

## 2022-04-01 NOTE — Progress Notes (Signed)
Pharmacy Antibiotic Note  Helen Hardy is a 71 y.o. female admitted on 03/08/2022 with  RA on chronic steroids, CKD, hypertension, presenting with acute onset diffuse abdominal pain. Pt found to have intraperitoneal free air, likely perforated diverticulitis.    She is s/p EL, with sigmoid colectomy, repair of bladder injury, possible fistula and placement of negative pressure dressing.  Pharmacy has been consulted for zosyn dosing.  Day 3 Zosyn Scr 1.79 down, CrCl 30 WBC 12.3, on stress dose steroids Afebrile  Plan: Zosyn 3.375g IV Q8H infused over 4hrs. Follow renal function ,cultures   Height: 4' 11"  (149.9 cm) Weight: 103 kg (227 lb 1.2 oz) IBW/kg (Calculated) : 43.2  Temp (24hrs), Avg:99.1 F (37.3 C), Min:98.2 F (36.8 C), Max:99.7 F (37.6 C)  Recent Labs  Lab 03/09/2022 1435 03/31/2022 1819 03/17/2022 1820 03/20/2022 2345 03/30/22 0416 03/30/22 0500 03/30/22 1100 03/31/22 0036 03/31/22 1444 03/07/2022 0341  WBC 4.3 3.1*  --   --  6.8  --   --  8.4  --  12.3*  CREATININE 1.35* 1.35*  --   --  1.83*  --  2.03* 2.18* 1.86* 1.79*  LATICACIDVEN  --   --  3.8* 3.9*  --  3.4*  --   --   --   --      Estimated Creatinine Clearance: 30.1 mL/min (A) (by C-G formula based on SCr of 1.79 mg/dL (H)).    Allergies  Allergen Reactions   Roxicodone [Oxycodone] Nausea And Vomiting    Can tolerate with antiemetics    Ultram [Tramadol] Nausea And Vomiting         Thank you for allowing pharmacy to be a part of this patient's care.   Adrian Saran, PharmD, BCPS Secure Chat if ?s 03/22/2022 8:40 AM

## 2022-04-01 NOTE — Op Note (Signed)
03/25/2022  2:11 PM  PATIENT:  Helen Hardy  71 y.o. female  Patient Care Team: Deland Pretty, MD as PCP - General (Internal Medicine) Lazaro Arms, RN as [facility]  PRE-OPERATIVE DIAGNOSIS:  OPEN ABDOMEN  POST-OPERATIVE DIAGNOSIS:  OPEN ABDOMEN  PROCEDURE:  Procedure(s): EXPLORATORY LAPAROTOMY OSTOMY DEBRIDEMENT CLOSURE/ABDOMINAL WOUND   Surgeon(s): Leighton Ruff, MD  ASSISTANT: Richard Miu, PA   ANESTHESIA:   general  EBL: 44m Total I/O In: 1179.2 [I.V.:593.6; Blood:315; NG/GT:30; IV Piggyback:240.7] Out: 6702[Urine:325; Emesis/NG output:170; Drains:130]  DRAINS: none   SPECIMEN:  No Specimen  DISPOSITION OF SPECIMEN:  N/A  COUNTS:  YES  PLAN OF CARE:  Patient already admitted  PATIENT DISPOSITION:  PACU - hemodynamically stable.  INDICATION: 70year old female multiple medical problems who underwent an emergent laparotomy on 9/24 due to perforated sigmoid colon.  She was unstable at time of the operation and a second look surgery was recommended.  She has now undergone resuscitation in the ICU and is off most of her pressors and starting to have signs of recovery.    OR FINDINGS: Minimal inflammation within the abdominal cavity.  No acute injuries noted.  Bladder repair appears intact.  No small bowel injury noted.  DESCRIPTION: the patient was identified in the preoperative holding area and taken to the OR where they were laid supine on the operating room table.  General anesthesia was induced without difficulty. SCDs were also noted to be in place prior to the initiation of anesthesia.  The patient was then prepped and draped in the usual sterile fashion.   A surgical timeout was performed indicating the correct patient, procedure, positioning and need for preoperative antibiotics.   We began with an open abdomen.  All 4 quadrants were evaluated.  There was no sign of ongoing contamination.  Small bowel was gently pulled  out of the pelvis and pelvic structures evaluated.  Anterior bladder repair appeared intact although difficult to visualize due to significant edema.  Rectal stump also appeared intact.  I identified the remaining colon in the left upper quadrant.  This was mobilized to allow for ostomy creation.  Splenic flexure was gently taken down.  A left upper quadrant ostomy incision was made using electrocautery.  Dissection was carried down through the subcutaneous tissue and a cruciate incision was made on the fascia.  The rectus muscle was then split and the peritoneum was entered to create an ostomy site.  The remaining colon was brought out through this ostomy site and secured into place.  The abdomen was then irrigated with 2 L of warm normal saline.  The fascia was then closed using #1 running PDS x2 and multiple interrupted #1 Novafil internal retention sutures.  A wound VAC was then placed on the open wound.  The ostomy was matured in standard [place] using interrupted 2-0 Vicryl sutures.  An ostomy appliance was placed.  The patient was then awakened from anesthesia and sent back to the ICU intubated for ongoing care.  Due to the emergent nature of the previous procedure an abdominal x-ray was performed to make sure that there were no foreign bodies left in the abdomen.  This was negative per reading radiologist.  ARosario Adie MD  [facility]

## 2022-04-01 NOTE — Progress Notes (Signed)
[place] Progress Note Patient Name: Helen Hardy DOB: 02-09-1951 MRN: [id number]   Date of Service  04/04/2022  HPI/Events of Note  Patient returned post-op. Initial ex-lap 9/24 for perforated bowel with post-op open abdomen. Returned to OR 9/27 for ostomy creation and abdomen closure. Returned to ICU with wound vac to open wound. On exam on levophed 1.5 on MV.  Received 1 U PRBC.  eICU Interventions  Remain on vent H/H ordered Post-op Hg 9.2 > no further transfusions needed. Cancel 2nd unit     Intervention Category Minor Interventions: Clinical assessment - ordering diagnostic tests  Bryttani Blew Rodman Pickle 03/21/2022, 9:29 PM

## 2022-04-01 NOTE — Progress Notes (Signed)
Mag 1.8 Replaced per protocol

## 2022-04-01 NOTE — Anesthesia Postprocedure Evaluation (Signed)
Anesthesia Post Note  Patient: Helen Hardy  Procedure(s) Performed: EXPLORATORY LAPAROTOMY (Abdomen) OSTOMY (Abdomen) DEBRIDEMENT CLOSURE/ABDOMINAL WOUND (Abdomen)     Patient location during evaluation: ICU Anesthesia Type: General Level of consciousness: patient remains intubated per anesthesia plan Pain management: pain level controlled Vital Signs Assessment: post-procedure vital signs reviewed and stable Respiratory status: patient remains intubated per anesthesia plan and patient on ventilator - see flowsheet for VS Cardiovascular status: blood pressure returned to baseline and stable Postop Assessment: no apparent nausea or vomiting Anesthetic complications: no   No notable events documented.  Last Vitals:  Vitals:   03/16/2022 1000 03/31/2022 1202  BP:  (!) 108/30  Pulse:  69  Resp: 20 20  Temp: 37.6 C   SpO2:  100%    Last Pain:  Vitals:   03/26/2022 0812  TempSrc: Axillary  PainSc:                  Pervis Hocking

## 2022-04-02 ENCOUNTER — Encounter (HOSPITAL_COMMUNITY): Payer: Self-pay | Admitting: General Surgery

## 2022-04-02 DIAGNOSIS — K631 Perforation of intestine (nontraumatic): Secondary | ICD-10-CM | POA: Diagnosis not present

## 2022-04-02 DIAGNOSIS — K668 Other specified disorders of peritoneum: Secondary | ICD-10-CM | POA: Diagnosis not present

## 2022-04-02 LAB — PHOSPHORUS: Phosphorus: 3.6 mg/dL (ref 2.5–4.6)

## 2022-04-02 LAB — COMPREHENSIVE METABOLIC PANEL
ALT: 39 U/L (ref 0–44)
AST: 39 U/L (ref 15–41)
Albumin: 2 g/dL — ABNORMAL LOW (ref 3.5–5.0)
Alkaline Phosphatase: 59 U/L (ref 38–126)
Anion gap: 8 (ref 5–15)
BUN: 43 mg/dL — ABNORMAL HIGH (ref 8–23)
CO2: 21 mmol/L — ABNORMAL LOW (ref 22–32)
Calcium: 7 mg/dL — ABNORMAL LOW (ref 8.9–10.3)
Chloride: 115 mmol/L — ABNORMAL HIGH (ref 98–111)
Creatinine, Ser: 1.81 mg/dL — ABNORMAL HIGH (ref 0.44–1.00)
GFR, Estimated: 29 mL/min — ABNORMAL LOW (ref >60)
Glucose, Bld: 152 mg/dL — ABNORMAL HIGH (ref 70–99)
Potassium: 3.7 mmol/L (ref 3.5–5.1)
Sodium: 144 mmol/L (ref 135–145)
Total Bilirubin: 0.6 mg/dL (ref 0.3–1.2)
Total Protein: 4.5 g/dL — ABNORMAL LOW (ref 6.5–8.1)

## 2022-04-02 LAB — CBC
HCT: 25.6 % — ABNORMAL LOW (ref 36.0–46.0)
Hemoglobin: 8.5 g/dL — ABNORMAL LOW (ref 12.0–15.0)
MCH: 32.6 pg (ref 26.0–34.0)
MCHC: 33.2 g/dL (ref 30.0–36.0)
MCV: 98.1 fL (ref 80.0–100.0)
Platelets: 78 10*3/uL — ABNORMAL LOW (ref 150–400)
RBC: 2.61 MIL/uL — ABNORMAL LOW (ref 3.87–5.11)
RDW: 20.5 % — ABNORMAL HIGH (ref 11.5–15.5)
WBC: 11.6 10*3/uL — ABNORMAL HIGH (ref 4.0–10.5)
nRBC: 1.3 % — ABNORMAL HIGH (ref 0.0–0.2)

## 2022-04-02 LAB — GLUCOSE, CAPILLARY
Glucose-Capillary: 128 mg/dL — ABNORMAL HIGH (ref 70–99)
Glucose-Capillary: 147 mg/dL — ABNORMAL HIGH (ref 70–99)
Glucose-Capillary: 127 mg/dL — ABNORMAL HIGH (ref 70–99)
Glucose-Capillary: 115 mg/dL — ABNORMAL HIGH (ref 70–99)
Glucose-Capillary: 141 mg/dL — ABNORMAL HIGH (ref 70–99)

## 2022-04-02 LAB — PROTIME-INR
INR: 1.4 — ABNORMAL HIGH (ref 0.8–1.2)
Prothrombin Time: 17.3 seconds — ABNORMAL HIGH (ref 11.4–15.2)

## 2022-04-02 LAB — MAGNESIUM: Magnesium: 2.4 mg/dL (ref 1.7–2.4)

## 2022-04-02 MED ORDER — MIDAZOLAM HCL 2 MG/2ML IJ SOLN
1.0000 mg | INTRAMUSCULAR | Status: DC | PRN
  Administered 2022-04-02 – 2022-04-03 (×6): 2 mg via INTRAVENOUS
  Filled 2022-04-02 (×8): qty 2

## 2022-04-02 MED ORDER — MIDODRINE HCL 5 MG PO TABS
5.0000 mg | ORAL_TABLET | Freq: Three times a day (TID) | ORAL | Status: DC
  Administered 2022-04-03 – 2022-04-06 (×4): 5 mg
  Filled 2022-04-02 (×6): qty 1

## 2022-04-02 MED ORDER — VITAL HIGH PROTEIN PO LIQD
1000.0000 mL | ORAL | Status: DC
  Administered 2022-04-02 – 2022-04-04 (×2): 1000 mL

## 2022-04-02 MED ORDER — GABAPENTIN 250 MG/5ML PO SOLN
300.0000 mg | Freq: Three times a day (TID) | ORAL | Status: DC
  Administered 2022-04-02 – 2022-04-06 (×12): 300 mg
  Filled 2022-04-02 (×14): qty 6

## 2022-04-02 MED ORDER — DARBEPOETIN ALFA 150 MCG/0.3ML IJ SOSY
300.0000 ug | PREFILLED_SYRINGE | Freq: Once | INTRAMUSCULAR | Status: AC
  Administered 2022-04-02: 300 ug via SUBCUTANEOUS
  Filled 2022-04-02: qty 0.6

## 2022-04-02 MED ORDER — OXYCODONE HCL 5 MG PO TABS
5.0000 mg | ORAL_TABLET | Freq: Four times a day (QID) | ORAL | Status: DC
  Administered 2022-04-02 – 2022-04-06 (×14): 5 mg
  Filled 2022-04-02 (×14): qty 1

## 2022-04-02 MED ORDER — POTASSIUM CHLORIDE 20 MEQ PO PACK
40.0000 meq | PACK | Freq: Once | ORAL | Status: AC
  Administered 2022-04-02: 40 meq
  Filled 2022-04-02: qty 2

## 2022-04-02 NOTE — Consult Note (Signed)
WOC Nurse Consult Note: Patient receiving care in [facility] ICU 1228.  I have requested the [place] order a medium wound VAC dressing for use tomorrow, and 4 ostomy pouches Kellie Simmering [phone] and 4 barrier rings Kellie Simmering [phone].  These items are for care Friday 04/02/22. Val Riles, RN, MSN, CWOCN, CNS-BC, pager [phone]

## 2022-04-02 NOTE — Progress Notes (Signed)
NAMECaitlin Hardy, MRN:  [id number], DOB:  [birth year], LOS: 4 ADMISSION DATE:  03/07/2022, CONSULTATION DATE:  9/24 REFERRING MD:  Donne Hazel, CHIEF COMPLAINT:  abdominal pain   History of Present Illness:  71 y/o female with extensive past medical history including rheumatoid arthritis on multiple immunosuppressants admitted with septic shock from perforated diverticulitis with bladder fistula requiring emergent ex-lap for sigmoid colectomy and bladder repair on 9/24.  She returned form the operating room early AM 9/25 with septic shock and remained on mechanical ventilatory support.    Pertinent  Medical History  Rheumatoid arthritis GERD Anemia of chronic disease Hyeprtension Non-hodgkin's lymphoma Prior ischemic colitis  Significant Hospital Events: Including procedures, antibiotic start and stop dates in addition to other pertinent events   9/24 admission for septic shock from perforated diverticulitis, emergent ex-lap with Dr. Donne Hazel for sigmoidectomy and bladder repair, brought to the ICU Blood culture 03/26/2022>> BCID staph species. Aureus not detected >>> Urine culture 03/26/2022>>  9/24 cefepime x 1 9/24 vanc  x 1 9/24 flagyl x 1 9/24 zosyn >> 9/25 micafungin >  9/27 back to OR for ex lap, ostomy, debridement/ closure of abd wound  Interim History / Subjective:  Transfused 1u PRBC overnight Off pressors this morning Currently weaning PSV 10/5  Objective   Blood pressure 137/74, pulse 97, temperature 100.2 F (37.9 C), resp. rate (!) 37, height 4' 11"  (1.499 m), weight 103 kg, SpO2 96 %.    Vent Mode: CPAP;PSV FiO2 (%):  [30 %] 30 % Set Rate:  [20 bmp] 20 bmp Vt Set:  [340 mL] 340 mL PEEP:  [5 cmH20] 5 cmH20 Pressure Support:  [5 cmH20] 5 cmH20 Plateau Pressure:  [15 cmH20-17 cmH20] 16 cmH20   Intake/Output Summary (Last 24 hours) at 04/02/2022 0910 Last data filed at 04/02/2022 0601 Gross per 24 hour  Intake 3256.82 ml  Output 925 ml  Net 2331.82 ml    Filed Weights   03/19/2022 2212  Weight: 103 kg    Examination: Fentanyl 150 Precedex 1.2 General:  Chronically ill appearing elderly female lying in bed in NAD, currently weaning on 10/5 HEENT: MM pink/moist, ETT/ OGT, pupils constricted, anicteric  Neuro:  Awake, agitated at times/ grimaces, not able to follow commands for me clearly at this time, moving arms spont  CV: rr, NSR, no obvious murmur PULM:  intermittently tachypneic in the 40's with TV in the 200's when agitated on PSV 10/5, otherwise seems to tolerate 10/5, coarse, no secretions, diminished in bases GI: obese, ostomy with scan bloody drainage, stoma pink, midline incision with wound VAC in place, foley> amber UO Extremities: warm/dry, no LE pitting edema  Skin: no rashes  UOP 685m/ 24hrs +2.6L Net +11.8 CVP 8  No imaging Labs reviewed> CBC pending, K 3.7, sCr stable , INR 1.4  Resolved Hospital Problem list     Assessment & Plan:  Acute respiratory failure with hypoxemia due to septic shock - cont full MV support with daily SBT/ WUA> currently weaning 10/5 with periods of agitation, tachypnea, and lower TV's.  Suspect pain may be a factor.  Continue to minimize sedation as tolerated, to push extubation efforts - CXR in am  - VAP/ PPI - PAD protocol> minimize precedex/ fentanyl as able, RASS goal 0/-1  Septic shock due to perforated diverticulitis Immunocompromised Entero-vesicula fistula s/p repair On steroids, 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 - currently off pressors, required low dose NE 0.5 mcg overnight - goal MAP > 65 - d/c femoral aline  if remains off pressors today - consider start weaning stress dose steroids if remains off pressors, Hydrocortisone 16m IV q8h - Continue zosyn and micafungin, day 5/x - follow BC> 1/4 staph hominis, likely contaminant, otherwise no growth  S/p Ex-lap for sigmoid colectomy and bladder repair - s/p OR 9/27 for ostomy creation and abdominal  closure.  - NPO, ok per CCS to start trickle feeds and use OGT for meds if not extubated - Wound vac per primary service - do not remove foley  Chronic pain, narcotic dependence at baseline Need for sedation for mechanical ventilation - RASS goal 0/-1 - PAD protocol> minimize fentanyl/ precedex as able for weaning efforts, goal RASS 0/-1 - add back reduced dose home oxy IR per tube 566mq 6 and reduced dose home gabapentin  HFrEF: Acuity uncertain. LVEF 35-45%. No prior to compare. Not hypervolemic on exam. CVP 10> 4 Demand myocardial ischemia due to septic shock: trop down-trending. - cont tele monitoring - CVP today 4, grossly net +11.8 but not hypervolemic on exam, hold diureses  Oliguric AKI Baseline CKD Metabolic acidosis from lactic acidosis from septic shock, improving - stable sCr - cont D5/LR till/ if TF started  - Trend BMP /mag/ urinary output - Replace electrolytes as indicated - Avoid nephrotoxic agents, ensure adequate renal perfusion  - do not remove foley per CCS  Anemia due to Fe deficiency - pending CBC - no evidence of bleeding - transfuse for Hgb <7  Hyperglycemia - SSI resistant   RA - hold prednisone, orencia, and leflunomide    Prognosis remains guarded, high risk of death given multi-organ failure and baseline comorbid illnesses  Best Practice (right click and "Reselect all SmartList Selections" daily)   Diet/type: NPO; start TF if not extubated  DVT prophylaxis: prophylactic heparin  GI prophylaxis: PPI Lines: Central line, Arterial Line, and yes and it is still needed Foley:  Yes, and it is still needed> do not remove per surgery Code Status:  full code Last date of multidisciplinary goals of care discussion [per primary, family updated 9/25]  Husband and daughter updated at bedside 9/28.  Labs   CBC: Recent Labs  Lab 03/08/2022 1435 03/22/2022 1819 04/04/2022 2047 03/30/22 0416 03/31/22 0036 03/31/22 1444 03/22/2022 0341 03/24/2022 2014   WBC 4.3 3.1*  --  6.8 8.4  --  12.3*  --   NEUTROABS 1.3* 1.1*  --   --   --   --   --   --   HGB 12.4 11.9*   < > 10.5* 8.9* 8.4* 8.1* 9.2*  HCT 40.0 38.8   < > 33.8* 28.1* 25.8* 24.9* 28.0*  MCV 112.0* 112.8*  --  112.3* 108.5*  --  107.8*  --   PLT 196 179  --  142* 118*  --  106*  --    < > = values in this interval not displayed.    Basic Metabolic Panel: Recent Labs  Lab 03/30/22 1100 03/31/22 0036 03/31/22 1444 03/13/2022 0341 04/02/22 0419  NA 138 143 142 143 144  K 4.7 4.8 3.8 3.8 3.7  CL 112* 115* 114* 113* 115*  CO2 13* 17* 20* 23 21*  GLUCOSE 162* 84 140* 155* 152*  BUN 27* 28* 31* 35* 43*  CREATININE 2.03* 2.18* 1.86* 1.79* 1.81*  CALCIUM 7.8* 7.7* 7.3* 7.1* 7.0*  MG  --   --   --  1.8 2.4  PHOS  --   --   --  3.8 3.6   GFR:  Estimated Creatinine Clearance: 29.8 mL/min (A) (by C-G formula based on SCr of 1.81 mg/dL (H)). Recent Labs  Lab 03/31/2022 1819 03/17/2022 1820 03/22/2022 2345 03/30/22 0416 03/30/22 0500 03/31/22 0036 04/04/2022 0341  WBC 3.1*  --   --  6.8  --  8.4 12.3*  LATICACIDVEN  --  3.8* 3.9*  --  3.4*  --   --     Liver Function Tests: Recent Labs  Lab 03/15/2022 1435 03/30/22 0416 03/31/22 0036 04/03/2022 0341 04/02/22 0419  AST 25 39 56* 59* 39  ALT 17 23 32 45* 39  ALKPHOS 72 52 49 63 59  BILITOT 0.7 0.7 1.3* 1.0 0.6  PROT 7.5 4.8* 5.6* 5.0* 4.5*  ALBUMIN 3.6 2.2* 3.0* 2.3* 2.0*   Recent Labs  Lab 03/30/2022 1435  LIPASE 22   No results for input(s): "AMMONIA" in the last 168 hours.  ABG    Component Value Date/Time   PHART 7.4 03/15/2022 0408   PCO2ART 35 03/18/2022 0408   PO2ART 91 03/22/2022 0408   HCO3 21.7 03/23/2022 0408   TCO2 16 (L) 03/31/2022 2134   ACIDBASEDEF 2.7 (H) 03/27/2022 0408   O2SAT 97.4 03/19/2022 0408     Coagulation Profile: Recent Labs  Lab 04/02/2022 1819 04/02/22 0419  INR 1.3* 1.4*    Cardiac Enzymes: No results for input(s): "CKTOTAL", "CKMB", "CKMBINDEX", "TROPONINI" in the last 168  hours.  HbA1C: Hgb A1c MFr Bld  Date/Time Value Ref Range Status  03/30/2022 09:30 AM 5.5 4.8 - 5.6 % Final    Comment:    (NOTE) Pre diabetes:          5.7%-6.4%  Diabetes:              >6.4%  Glycemic control for   <7.0% adults with diabetes   12/26/2019 05:30 AM 5.7 (H) 4.8 - 5.6 % Final    Comment:    (NOTE)         Prediabetes: 5.7 - 6.4         Diabetes: >6.4         Glycemic control for adults with diabetes: <7.0     CBG: Recent Labs  Lab 04/03/2022 1631 03/07/2022 1923 03/25/2022 2308 04/02/22 0313 04/02/22 0749  GLUCAP 142* 175* 146* 128* 147*   Critical care time: 40 minutes       Kennieth Rad, MSN, AG-ACNP-BC [facility] 04/02/2022, 9:10 AM  See Amion for pager If no response to pager, please call PCCM consult pager After 7:00 pm call Elink

## 2022-04-02 NOTE — Progress Notes (Signed)
Nutrition Follow-up  DOCUMENTATION CODES:   Morbid obesity  INTERVENTION:   -Continue trickle feeds of Vital HP @ 20 ml/hr via OGT  -Recommended goal rate: advance by 10 ml every 12 hours to goal of 45 ml/hr w/ 60 ml Prosource 20 TF daily -Goal provides 1160 kcals, 114g protein and 902 ml H20  -Please re-consult RD if able to advance tube feedings  NUTRITION DIAGNOSIS:   Inadequate oral intake related to inability to eat as evidenced by NPO status.  Ongoing.  GOAL:   Provide needs based on ASPEN/SCCM guidelines  Will progress  MONITOR:   Labs, Weight trends, Vent status, I & O's  REASON FOR ASSESSMENT:   Consult Enteral/tube feeding initiation and management (trickle feeds only)  ASSESSMENT:   71 y/o female with extensive past medical history including rheumatoid arthritis on multiple immunosuppressants admitted with septic shock from perforated diverticulitis with bladder fistula requiring emergent ex-lap for sigmoid colectomy and bladder repair on 9/24.  She returned form the operating room early AM 9/25 with septic shock and remained on mechanical ventilatory support.  9/24: admitted, s/p ex lap, sigmoid colectomy, bladder repair, Wound VAC, remained intubated 9/27: s/p ex lap, ostomy, debridement, closure of abdominal wound  RD consulted as pt is starting trickle feeds today. RD provided tube feeding goal above.  Patient is currently intubated on ventilator support MV: 5.4 L/min Temp (24hrs), Avg:99.7 F (37.6 C), Min:98.8 F (37.1 C), Max:100.2 F (37.9 C)  Admission weight: 227 lbs. Per nursing documentation, pt with mild BLE edema.  Medications: KLOR-CON, Precedex, D5 infusion, Fentanyl, Levophed  Labs reviewed: CBGs: 128-175  Diet Order:   Diet Order             Diet NPO time specified  Diet effective now                   EDUCATION NEEDS:   No education needs have been identified at this time  Skin:  Skin Assessment: Skin Integrity  Issues: Skin Integrity Issues:: Incisions Incisions: 9/24: abdomen, 9/27: abdomen  Last BM:  9/24  Height:   Ht Readings from Last 1 Encounters:  03/31/2022 4' 11"  (1.499 m)    Weight:   Wt Readings from Last 1 Encounters:  04/04/2022 103 kg    BMI:  Body mass index is 45.86 kg/m.  Estimated Nutritional Needs:   Kcal:  1200-1450  Protein:  >111g  Fluid:  1.6L/day  Clayton Bibles, MS, RD, LDN Inpatient Clinical Dietitian Contact information available via Amion

## 2022-04-02 NOTE — Progress Notes (Signed)
[facility] Progress Note  1 Day Post-Op  Subjective: CC-  Uncomfortable on the vent this morning. Stable on levo 0.5 No ostomy output NG with 170cc output yesterday, and about 100cc bilious drainage over night.  Objective: Vital signs in last 24 hours: Temp:  [98.7 F (37.1 C)-100 F (37.8 C)] 100 F (37.8 C) (09/28 0630) Pulse Rate:  [68-81] 81 (09/28 0736) Resp:  [17-28] 28 (09/28 0736) BP: (104-137)/(30-74) 137/74 (09/28 0736) SpO2:  [95 %-100 %] 100 % (09/28 0736) Arterial Line BP: (87-167)/(49-92) 125/65 (09/28 0630) FiO2 (%):  [30 %] 30 % (09/28 0736) Last BM Date : 03/25/2022  Intake/Output from previous day: 09/27 0701 - 09/28 0700 In: 3539.6 [I.V.:2823.7; Blood:315; NG/GT:60; IV Piggyback:340.9] Out: 925 [Urine:625; Emesis/NG output:170; Drains:130] Intake/Output this shift: No intake/output data recorded.  PE: Gen:  on the vent Card:  RRR Pulm:  mechanically ventilated Abd: Soft, vac to midline with good seal, ostomy viable with sweat in the bag Ext: trace BLE edema GU: foley  Lab Results:  Recent Labs    03/31/22 0036 03/31/22 1444 03/19/2022 0341 03/12/2022 2014  WBC 8.4  --  12.3*  --   HGB 8.9*   < > 8.1* 9.2*  HCT 28.1*   < > 24.9* 28.0*  PLT 118*  --  106*  --    < > = values in this interval not displayed.   BMET Recent Labs    04/02/2022 0341 04/02/22 0419  NA 143 144  K 3.8 3.7  CL 113* 115*  CO2 23 21*  GLUCOSE 155* 152*  BUN 35* 43*  CREATININE 1.79* 1.81*  CALCIUM 7.1* 7.0*   PT/INR Recent Labs    04/02/22 0419  LABPROT 17.3*  INR 1.4*   CMP     Component Value Date/Time   NA 144 04/02/2022 0419   NA 144 04/29/2017 0929   K 3.7 04/02/2022 0419   K 4.0 04/29/2017 0929   CL 115 (H) 04/02/2022 0419   CL 103 06/21/2015 0800   CO2 21 (L) 04/02/2022 0419   CO2 25 04/29/2017 0929   GLUCOSE 152 (H) 04/02/2022 0419   GLUCOSE 119 04/29/2017 0929   GLUCOSE 108 06/21/2015 0800   BUN 43 (H) 04/02/2022 0419   BUN  17.7 04/29/2017 0929   CREATININE 1.81 (H) 04/02/2022 0419   CREATININE 1.34 (H) 03/13/2022 0947   CREATININE 0.8 04/29/2017 0929   CALCIUM 7.0 (L) 04/02/2022 0419   CALCIUM 9.3 04/29/2017 0929   PROT 4.5 (L) 04/02/2022 0419   PROT 6.3 (L) 04/29/2017 0929   ALBUMIN 2.0 (L) 04/02/2022 0419   ALBUMIN 3.4 (L) 04/29/2017 0929   AST 39 04/02/2022 0419   AST 21 03/13/2022 0947   AST 19 04/29/2017 0929   ALT 39 04/02/2022 0419   ALT 15 03/13/2022 0947   ALT 16 04/29/2017 0929   ALKPHOS 59 04/02/2022 0419   ALKPHOS 55 04/29/2017 0929   BILITOT 0.6 04/02/2022 0419   BILITOT 0.5 03/13/2022 0947   BILITOT 0.43 04/29/2017 0929   GFRNONAA 29 (L) 04/02/2022 0419   GFRNONAA 42 (L) 03/13/2022 0947   GFRAA >60 04/05/2020 0602   GFRAA 46 (L) 03/19/2020 1015   Lipase     Component Value Date/Time   LIPASE 22 03/06/2022 1435       Studies/Results: DG Abd Portable 1V  Result Date: 03/11/2022 CLINICAL DATA:  Abnormal count during abdominal surgery. EXAM: PORTABLE ABDOMEN - 1 VIEW COMPARISON:  None Available. FINDINGS: No surgical instrument,  suture or radiopaque sponge is identified in the visualized abdomen or pelvis. Linear density near the right groin may represent a venous catheter. No abnormal calcifications or bony abnormalities. Visualized bowel gas pattern is unremarkable. IMPRESSION: No surgical instrument, suture or radiopaque sponge identified in the visualized abdomen or pelvis. Linear density at the level of the right groin may represent a femoral venous catheter. Electronically Signed   By: Aletta Edouard M.D.   On: 04/02/2022 14:12    Anti-infectives: Anti-infectives (From admission, onward)    Start     Dose/Rate Route Frequency Ordered Stop   03/30/22 1000  micafungin (MYCAMINE) 100 mg in sodium chloride 0.9 % 100 mL IVPB        100 mg 105 mL/hr over 1 Hours Intravenous Every 24 hours 03/30/22 0849     03/30/22 0000  piperacillin-tazobactam (ZOSYN) IVPB 3.375 g         3.375 g 12.5 mL/hr over 240 Minutes Intravenous Every 8 hours 04/03/2022 2141     03/10/2022 1800  ceFEPIme (MAXIPIME) 2 g in sodium chloride 0.9 % 100 mL IVPB        2 g 200 mL/hr over 30 Minutes Intravenous  Once 03/31/2022 1746 03/25/2022 1930   03/14/2022 1800  metroNIDAZOLE (FLAGYL) IVPB 500 mg        500 mg 100 mL/hr over 60 Minutes Intravenous  Once 03/30/2022 1746 03/28/2022 1930   03/17/2022 1800  vancomycin (VANCOCIN) IVPB 1000 mg/200 mL premix        1,000 mg 200 mL/hr over 60 Minutes Intravenous  Once 03/22/2022 1746 03/17/2022 2315        Assessment/Plan Perforated sigmoid colon with diffuse feculent peritonitis and septic shock -POD#4 s/p Exploratory laparotomy with sigmoid colectomy; repair of bladder injury, possible fistula; placement of negative pressure dressing 9/24 Dr. Donne Hazel -POD#1 s/p EXPLORATORY LAPAROTOMY; OSTOMY; Welcome 9/27 Dr. Marcello Moores - [place] consult for new ostomy and abdominal wound vac, first change tomorrow 9/29 - continue antibiotics - if patient does not get extubated today ok to start trickle tube feedings  ID - zosyn 9/25>>, micafungin 9/25>> FEN - IVF, NPO/OG to LIWS VTE - SCDs, sq heparin Foley - do not remove due to bladder repair during surgery  VDRF - wean per CCM Shock - stable on very lose dose pressor levo 0.5 AKI on CKD - making some urine RA on MTX, on steroids Non-hodgkin's lymphoma HTN HFrEF Chronic pain    LOS: 4 days    Wellington Hampshire, [facility] 04/02/2022, 8:02 AM Please see Amion for pager number during day hours 7:00am-4:30pm

## 2022-04-02 NOTE — Progress Notes (Signed)
Ms. [name] has surgery yesterday to close of the abdominal wound.  She has a colostomy bag.  She is still on the vent.  She was transfused 1 unit of blood yesterday with a hemoglobin of 8.1.  Her repeat hemoglobin was 9.2.  Her white cell count has been okay and her platelet count has been okay also.  Her chemical study is show sodium 144.  Potassium 3.7.  BUN 43 creatinine 1.81.  Calcium is 7 with an albumin of 2.0.  I will going give her a dose of Aranesp.  I think this might help with her anemia.  Hopefully my help with blood flow with higher hemoglobin level.  I spoke to her husband this morning.  I have known them for about 20 years.  Her vital signs show temperature of 100.  Pulse 68.  Blood pressure 130/68.  Hopefully, she will be able to come off the ventilator.  She is certainly quite tough.  I know she has been through a lot ever since I have known her.  Again, her arthritis really has been her biggest problem clinically.  I know that she is getting great care from all the staff down in the ICU.  Lattie Haw, MD  Philippians 4:6

## 2022-04-03 ENCOUNTER — Inpatient Hospital Stay (HOSPITAL_COMMUNITY): Payer: Medicare Other

## 2022-04-03 ENCOUNTER — Inpatient Hospital Stay: Payer: Self-pay

## 2022-04-03 DIAGNOSIS — K631 Perforation of intestine (nontraumatic): Secondary | ICD-10-CM | POA: Diagnosis not present

## 2022-04-03 DIAGNOSIS — K668 Other specified disorders of peritoneum: Secondary | ICD-10-CM | POA: Diagnosis not present

## 2022-04-03 LAB — RENAL FUNCTION PANEL
Albumin: 2 g/dL — ABNORMAL LOW (ref 3.5–5.0)
Anion gap: 6 (ref 5–15)
BUN: 51 mg/dL — ABNORMAL HIGH (ref 8–23)
CO2: 20 mmol/L — ABNORMAL LOW (ref 22–32)
Calcium: 7.4 mg/dL — ABNORMAL LOW (ref 8.9–10.3)
Chloride: 117 mmol/L — ABNORMAL HIGH (ref 98–111)
Creatinine, Ser: 1.67 mg/dL — ABNORMAL HIGH (ref 0.44–1.00)
GFR, Estimated: 32 mL/min — ABNORMAL LOW (ref >60)
Glucose, Bld: 180 mg/dL — ABNORMAL HIGH (ref 70–99)
Phosphorus: 3.1 mg/dL (ref 2.5–4.6)
Potassium: 4.2 mmol/L (ref 3.5–5.1)
Sodium: 143 mmol/L (ref 135–145)

## 2022-04-03 LAB — CBC
HCT: 24.7 % — ABNORMAL LOW (ref 36.0–46.0)
Hemoglobin: 8.2 g/dL — ABNORMAL LOW (ref 12.0–15.0)
MCH: 32.9 pg (ref 26.0–34.0)
MCHC: 33.2 g/dL (ref 30.0–36.0)
MCV: 99.2 fL (ref 80.0–100.0)
Platelets: 61 10*3/uL — ABNORMAL LOW (ref 150–400)
RBC: 2.49 MIL/uL — ABNORMAL LOW (ref 3.87–5.11)
RDW: 20.4 % — ABNORMAL HIGH (ref 11.5–15.5)
WBC: 8.5 10*3/uL (ref 4.0–10.5)
nRBC: 1.6 % — ABNORMAL HIGH (ref 0.0–0.2)

## 2022-04-03 LAB — GLUCOSE, CAPILLARY
Glucose-Capillary: 139 mg/dL — ABNORMAL HIGH (ref 70–99)
Glucose-Capillary: 145 mg/dL — ABNORMAL HIGH (ref 70–99)
Glucose-Capillary: 153 mg/dL — ABNORMAL HIGH (ref 70–99)
Glucose-Capillary: 155 mg/dL — ABNORMAL HIGH (ref 70–99)
Glucose-Capillary: 162 mg/dL — ABNORMAL HIGH (ref 70–99)
Glucose-Capillary: 164 mg/dL — ABNORMAL HIGH (ref 70–99)
Glucose-Capillary: 167 mg/dL — ABNORMAL HIGH (ref 70–99)

## 2022-04-03 LAB — DIFFERENTIAL
Abs Immature Granulocytes: 0.11 10*3/uL — ABNORMAL HIGH (ref 0.00–0.07)
Basophils Absolute: 0.1 10*3/uL (ref 0.0–0.1)
Basophils Relative: 1 %
Eosinophils Absolute: 0 10*3/uL (ref 0.0–0.5)
Eosinophils Relative: 0 %
Immature Granulocytes: 1 %
Lymphocytes Relative: 7 %
Lymphs Abs: 0.6 10*3/uL — ABNORMAL LOW (ref 0.7–4.0)
Monocytes Absolute: 0.4 10*3/uL (ref 0.1–1.0)
Monocytes Relative: 5 %
Neutro Abs: 7.2 10*3/uL (ref 1.7–7.7)
Neutrophils Relative %: 86 %

## 2022-04-03 LAB — IRON AND TIBC
Iron: 51 ug/dL (ref 28–170)
Saturation Ratios: 39 % — ABNORMAL HIGH (ref 10.4–31.8)
TIBC: 132 ug/dL — ABNORMAL LOW (ref 250–450)
UIBC: 81 ug/dL

## 2022-04-03 LAB — CULTURE, BLOOD (ROUTINE X 2)
Culture: NO GROWTH
Special Requests: ADEQUATE

## 2022-04-03 LAB — APTT
aPTT: >200 seconds (ref 24–36)
aPTT: 67 seconds — ABNORMAL HIGH (ref 24–36)
aPTT: 68 seconds — ABNORMAL HIGH (ref 24–36)

## 2022-04-03 LAB — TECHNOLOGIST SMEAR REVIEW

## 2022-04-03 LAB — MAGNESIUM: Magnesium: 2.4 mg/dL (ref 1.7–2.4)

## 2022-04-03 MED ORDER — FUROSEMIDE 10 MG/ML IJ SOLN
40.0000 mg | Freq: Once | INTRAMUSCULAR | Status: AC
  Administered 2022-04-03: 40 mg via INTRAVENOUS
  Filled 2022-04-03: qty 4

## 2022-04-03 MED ORDER — SODIUM CHLORIDE 0.9% FLUSH
10.0000 mL | Freq: Two times a day (BID) | INTRAVENOUS | Status: DC
  Administered 2022-04-04 (×2): 10 mL
  Administered 2022-04-05: 40 mL
  Administered 2022-04-05 – 2022-04-06 (×2): 10 mL
  Administered 2022-04-06: 40 mL
  Administered 2022-04-07: 10 mL

## 2022-04-03 MED ORDER — SODIUM CHLORIDE 0.9% FLUSH
3.0000 mL | Freq: Two times a day (BID) | INTRAVENOUS | Status: DC
  Administered 2022-04-03 – 2022-04-07 (×5): 3 mL via INTRAVENOUS

## 2022-04-03 MED ORDER — ARGATROBAN 50 MG/50ML IV SOLN
0.5000 ug/kg/min | INTRAVENOUS | Status: DC
  Administered 2022-04-03 – 2022-04-05 (×4): 0.5 ug/kg/min via INTRAVENOUS
  Filled 2022-04-03 (×5): qty 50

## 2022-04-03 MED ORDER — SODIUM CHLORIDE 0.9% FLUSH: 10.0000 mL | INTRAVENOUS | Status: DC | PRN

## 2022-04-03 MED ORDER — SODIUM CHLORIDE 0.9% FLUSH
3.0000 mL | INTRAVENOUS | Status: DC | PRN
  Administered 2022-04-05: 3 mL via INTRAVENOUS

## 2022-04-03 MED ORDER — FENTANYL 2500MCG IN NS 250ML (10MCG/ML) PREMIX INFUSION
0.0000 ug/h | INTRAVENOUS | Status: DC
  Administered 2022-04-03: 225 ug/h via INTRAVENOUS
  Administered 2022-04-04: 350 ug/h via INTRAVENOUS
  Administered 2022-04-04: 150 ug/h via INTRAVENOUS
  Administered 2022-04-04 – 2022-04-07 (×10): 400 ug/h via INTRAVENOUS
  Filled 2022-04-03 (×13): qty 250

## 2022-04-03 MED ORDER — SODIUM CHLORIDE 0.9 % IV SOLN: 250.0000 mL | INTRAVENOUS | Status: DC | PRN

## 2022-04-03 MED ORDER — POLYETHYLENE GLYCOL 3350 17 G PO PACK
17.0000 g | PACK | Freq: Every day | ORAL | Status: DC
  Administered 2022-04-03 – 2022-04-06 (×3): 17 g
  Filled 2022-04-03 (×3): qty 1

## 2022-04-03 MED ORDER — HYDROCORTISONE SOD SUC (PF) 100 MG IJ SOLR
50.0000 mg | Freq: Three times a day (TID) | INTRAMUSCULAR | Status: DC
  Administered 2022-04-03 – 2022-04-06 (×9): 50 mg via INTRAVENOUS
  Filled 2022-04-03 (×9): qty 2

## 2022-04-03 MED ORDER — TRAVASOL 10 % IV SOLN
INTRAVENOUS | Status: AC
  Filled 2022-04-03: qty 652.8

## 2022-04-03 NOTE — Progress Notes (Signed)
I have noted that her platelet count has been dropping.  Back on 03/30/2022, platelet count was 142K.  And now is dropped significantly.  On 9/27, it was 106K.  On 928, the platelet count was 78K.  Now it is 61K.  I would stop the heparin that she is getting.  I would send off a HITT assay on her.  We will have the pharmacy give her a direct thrombin inhibitor as she does need anticoagulation because of her surgery and her multiple health issues.  Her hemoglobin is 8.2.  She did get a dose of Aranesp yesterday.  She is on antibiotics.  I am not seeing where has any positive cultures.  No back on 924 there was 1 blood culture that grew out a species of Staphylococcus.  She is on Zosyn and micafungin.  I will have to look at a blood smear on her.  I do not think there is any bleeding.  She does not need to be transfused with platelets.  I would think that if she was really septic, or if she had a gram-negative rod bacteremia, this can certainly lead to thrombocytopenia.  Again, I would probably stop the heparin.  Again she does need to be anticoagulated to prevent thromboembolic disease.  I would not flush her lines with heparin.  We will have to see what the HITT assay shows.  Her chemistry studies look okay.  Her BUN is 51 creatinine 1.67.  Her calcium 7.4.  LFTs are all right.  Again, I would stop the heparin for right now.  I would try to stop antibiotics.  Again there is no obvious positive cultures.  However, given the perforated bowel, I suspect that she probably does need antibiotics.  We would hold off on transfusing her right now.  I probably would check iron studies on her.   Lattie Haw, MD  Psalms 86:7

## 2022-04-03 NOTE — Progress Notes (Signed)
ANTICOAGULATION CONSULT NOTE   Pharmacy Consult for Argatroban Indication: r/o HIT  Allergies  Allergen Reactions   Heparin Other (See Comments)    R/o HIT (HIT antb ordered on 04/03/22)   Roxicodone [Oxycodone] Nausea And Vomiting    Can tolerate with antiemetics    Ultram [Tramadol] Nausea And Vomiting    Patient Measurements: Height: 4' 11"  (149.9 cm) Weight: 113.6 kg (250 lb 7.1 oz) IBW/kg (Calculated) : 43.2  Vital Signs: Temp: 97.5 F (36.4 C) (09/29 1800) BP: 125/87 (09/29 1800) Pulse Rate: 91 (09/29 1700)  Labs: Recent Labs    03/24/2022 0341 03/20/2022 2014 04/02/22 0419 04/02/22 1234 04/03/22 0317 04/03/22 1427 04/03/22 1751  HGB 8.1* 9.2*  --  8.5* 8.2*  --   --   HCT 24.9* 28.0*  --  25.6* 24.7*  --   --   PLT 106*  --   --  78* 61*  --   --   APTT  --   --   --   --   --  >200* 67*  LABPROT  --   --  17.3*  --   --   --   --   INR  --   --  1.4*  --   --   --   --   CREATININE 1.79*  --  1.81*  --  1.67*  --   --      Estimated Creatinine Clearance: 34.3 mL/min (A) (by C-G formula based on SCr of 1.67 mg/dL (H)).   Assessment: Patient is a 71 y.o F with hx non-Hodgkin's lymphoma and anemia who presented to the ED on 03/11/2022 with c/o abdominal pain, n/v and diarrhea.  She was found to be septic with perforated diverticulitis.  She underwent  sigmoidectomy, bladder repair and open abdominal vac placement on 03/23/2022.  She returned to the OR on 03/26/2022 for exp lap with ostomy and debridement closure/abdominal wound. Heparin SQ started on 03/30/22 for VTE prophylaxis.  Platelets were 179K on 03/12/2022 with levels trending each day since admission.  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  1st aPTT level was therapeutic at 67 sec  No bleeding or issues per RN HIT Ab still pending  Goal of Therapy:  aPTT 50-90 seconds Monitor platelets by anticoagulation protocol: Yes   Plan:  Continue current argatroban rate  of 0.5 mcg/kg/min Recheck aPTT in 4 hours to confirm Monitor plts closely Monitor for s/sx bleeding f/u with HIT antb     Adrian Saran, PharmD, BCPS Secure Chat if ?s 04/03/2022 6:34 PM

## 2022-04-03 NOTE — Progress Notes (Signed)
[place] for Argatroban Indication: r/o HIT  Allergies  Allergen Reactions   Roxicodone [Oxycodone] Nausea And Vomiting    Can tolerate with antiemetics    Ultram [Tramadol] Nausea And Vomiting    Patient Measurements: Height: 4' 11"  (149.9 cm) Weight: 103 kg (227 lb 1.2 oz) IBW/kg (Calculated) : 43.2  Vital Signs: Temp: 97.9 F (36.6 C) (09/29 0600) BP: 135/81 (09/29 0600) Pulse Rate: 67 (09/29 0600)  Labs: Recent Labs    03/31/2022 0341 03/21/2022 2014 04/02/22 0419 04/02/22 1234 04/03/22 0317  HGB 8.1* 9.2*  --  8.5* 8.2*  HCT 24.9* 28.0*  --  25.6* 24.7*  PLT 106*  --   --  78* 61*  LABPROT  --   --  17.3*  --   --   INR  --   --  1.4*  --   --   CREATININE 1.79*  --  1.81*  --  1.67*    Estimated Creatinine Clearance: 32.3 mL/min (A) (by C-G formula based on SCr of 1.67 mg/dL (H)).   Assessment: Patient is a 71 y.o F with hx non-Hodgkin's lymphoma and anemia who presented to the ED on 03/14/2022 with c/o abdominal pain, n/v and diarrhea.  She was found to be septic with perforated diverticulitis.  She underwent  sigmoidectomy, bladder repair and open abdominal vac placement on 03/15/2022.  She returned to the OR on 03/15/2022 for exp lap with ostomy and debridement closure/abdominal wound. Heparin SQ started on 03/30/22 for VTE prophylaxis.  Platelets were 179K on 03/18/2022 with levels trending each day since admission.  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  Today, 04/03/2022: - plts down 61K - hgb somewhat stable (Aranesp 300 mcg x1 given on 9/28) - no bleeding document - LFTs wnl - HIT   Goal of Therapy:  aPTT 50-90 seconds Monitor platelets by anticoagulation protocol: Yes   Plan:  - start argatroban at 0.5 mcg/kg/min - check 4 hr aPTT - monitor plts closely - monitor for s/sx bleeding - f/u with HIT antb   Zitlali Primm P 04/03/2022,8:09 AM

## 2022-04-03 NOTE — Progress Notes (Addendum)
[facility] Progress Note  2 Days Post-Op  Subjective: CC-  Trickle feeds started 9/28. Episode of bilious emesis overnight.  Husband is bedside  Objective: Vital signs in last 24 hours: Temp:  [97.5 F (36.4 C)-100.2 F (37.9 C)] 97.9 F (36.6 C) (09/29 0600) Pulse Rate:  [61-100] 67 (09/29 0600) Resp:  [12-37] 20 (09/29 0600) BP: (109-145)/(38-96) 135/81 (09/29 0600) SpO2:  [86 %-100 %] 91 % (09/29 0600) Arterial Line BP: (108-159)/(54-90) 152/87 (09/28 1600) FiO2 (%):  [30 %] 30 % (09/29 0325) Last BM Date : 04/02/2022  Intake/Output from previous day: 09/28 0701 - 09/29 0700 In: 4057 [I.V.:3105.9; NG/GT:700.3; IV Piggyback:250.8] Out: 450 [Urine:450] Intake/Output this shift: Total I/O In: 242.8 [I.V.:210.8; NG/GT:32] Out: -   PE: Gen:  on the vent Card:  RRR Pulm:  mechanically ventilated Abd: Soft, colostomy with SS output. Stoma pink and viable Midline wound vac change this am. See photo below Ext: trace BLE edema GU: foley     Lab Results:  Recent Labs    04/02/22 1234 04/03/22 0317  WBC 11.6* 8.5  HGB 8.5* 8.2*  HCT 25.6* 24.7*  PLT 78* 61*    BMET Recent Labs    04/02/22 0419 04/03/22 0317  NA 144 143  K 3.7 4.2  CL 115* 117*  CO2 21* 20*  GLUCOSE 152* 180*  BUN 43* 51*  CREATININE 1.81* 1.67*  CALCIUM 7.0* 7.4*    PT/INR Recent Labs    04/02/22 0419  LABPROT 17.3*  INR 1.4*    CMP     Component Value Date/Time   NA 143 04/03/2022 0317   NA 144 04/29/2017 0929   K 4.2 04/03/2022 0317   K 4.0 04/29/2017 0929   CL 117 (H) 04/03/2022 0317   CL 103 06/21/2015 0800   CO2 20 (L) 04/03/2022 0317   CO2 25 04/29/2017 0929   GLUCOSE 180 (H) 04/03/2022 0317   GLUCOSE 119 04/29/2017 0929   GLUCOSE 108 06/21/2015 0800   BUN 51 (H) 04/03/2022 0317   BUN 17.7 04/29/2017 0929   CREATININE 1.67 (H) 04/03/2022 0317   CREATININE 1.34 (H) 03/13/2022 0947   CREATININE 0.8 04/29/2017 0929   CALCIUM 7.4 (L) 04/03/2022 0317    CALCIUM 9.3 04/29/2017 0929   PROT 4.5 (L) 04/02/2022 0419   PROT 6.3 (L) 04/29/2017 0929   ALBUMIN 2.0 (L) 04/03/2022 0317   ALBUMIN 3.4 (L) 04/29/2017 0929   AST 39 04/02/2022 0419   AST 21 03/13/2022 0947   AST 19 04/29/2017 0929   ALT 39 04/02/2022 0419   ALT 15 03/13/2022 0947   ALT 16 04/29/2017 0929   ALKPHOS 59 04/02/2022 0419   ALKPHOS 55 04/29/2017 0929   BILITOT 0.6 04/02/2022 0419   BILITOT 0.5 03/13/2022 0947   BILITOT 0.43 04/29/2017 0929   GFRNONAA 32 (L) 04/03/2022 0317   GFRNONAA 42 (L) 03/13/2022 0947   GFRAA >60 04/05/2020 0602   GFRAA 46 (L) 03/19/2020 1015   Lipase     Component Value Date/Time   LIPASE 22 03/20/2022 1435       Studies/Results: DG Abd Portable 1V  Result Date: 03/09/2022 CLINICAL DATA:  Abnormal count during abdominal surgery. EXAM: PORTABLE ABDOMEN - 1 VIEW COMPARISON:  None Available. FINDINGS: No surgical instrument, suture or radiopaque sponge is identified in the visualized abdomen or pelvis. Linear density near the right groin may represent a venous catheter. No abnormal calcifications or bony abnormalities. Visualized bowel gas pattern is unremarkable. IMPRESSION: No surgical  instrument, suture or radiopaque sponge identified in the visualized abdomen or pelvis. Linear density at the level of the right groin may represent a femoral venous catheter. Electronically Signed   By: Aletta Edouard M.D.   On: 03/06/2022 14:12    Anti-infectives: Anti-infectives (From admission, onward)    Start     Dose/Rate Route Frequency Ordered Stop   03/30/22 1000  micafungin (MYCAMINE) 100 mg in sodium chloride 0.9 % 100 mL IVPB        100 mg 105 mL/hr over 1 Hours Intravenous Every 24 hours 03/30/22 0849     03/30/22 0000  piperacillin-tazobactam (ZOSYN) IVPB 3.375 g        3.375 g 12.5 mL/hr over 240 Minutes Intravenous Every 8 hours 03/07/2022 2141     03/06/2022 1800  ceFEPIme (MAXIPIME) 2 g in sodium chloride 0.9 % 100 mL IVPB        2  g 200 mL/hr over 30 Minutes Intravenous  Once 03/14/2022 1746 03/12/2022 1930   03/18/2022 1800  metroNIDAZOLE (FLAGYL) IVPB 500 mg        500 mg 100 mL/hr over 60 Minutes Intravenous  Once 03/31/2022 1746 03/10/2022 1930   03/31/2022 1800  vancomycin (VANCOCIN) IVPB 1000 mg/200 mL premix        1,000 mg 200 mL/hr over 60 Minutes Intravenous  Once 03/06/2022 1746 03/12/2022 2315        Assessment/Plan Perforated sigmoid colon with diffuse feculent peritonitis and septic shock -POD#5 s/p Exploratory laparotomy with sigmoid colectomy; repair of bladder injury, possible fistula; placement of negative pressure dressing 9/24 Dr. Donne Hazel -POD#2 s/p EXPLORATORY LAPAROTOMY; OSTOMY; [place] 9/27 Dr. Marcello Moores - [place] consult for new ostomy and abdominal wound vac, vac change MWF - continue antibiotics - leukocytosis resolved - trickle Tfs started 9/28 - episode of emesis. Overnight. Hold trickle Tfs. May be developing ileus. Will start TPN  ID - zosyn 9/25>>, micafungin 9/25>> FEN - IVF, NPO/OG to LIWS VTE - SCDs, sq heparin Foley - do not remove due to bladder repair during surgery  VDRF - wean per CCM Shock - stable on very lose dose pressor levo 0.5 AKI on CKD - making some urine RA on MTX, on steroids Non-hodgkin's lymphoma HTN HFrEF Chronic pain    LOS: 5 days    Winferd Humphrey, [facility] 04/03/2022, 7:44 AM Please see Amion for pager number during day hours 7:00am-4:30pm

## 2022-04-03 NOTE — Consult Note (Signed)
WOC Nurse Consult Note: Patient receiving care in [facility] ICU 1228. Spouse at bedside. PA-C Loran Senters at bedside Reason for Consult: VAC change to abdominal surgical wound Wound type: surgical Pressure Injury POA: NA Measurement: 25 cm x 5.2 x 6 Wound bed: pink, see photo from today Drainage (amount, consistency, odor) transparent light brown dripped from VAC foam when removed Periwound: intact Dressing procedure/placement/frequency: All black foam removed from wound. Three pieces of black foam placed into wound. Drape applied, immediate seal obtained at 166mHg.  Medium VAC dressing being [place] Monday.  [place] ost[place] follow up Stoma type/location: LUQ colostomy Stomal assessment/size: 1 1/4 inches, round, red, moist, sutures intact Peristomal assessment: intact Treatment options for stomal/peristomal skin: barrier ring Output: scant amount of serosanginous in existing pouch Ostomy pouching: 1pc. Convex pouch, LKellie Simmering##[id number] barrier ring LKellie Simmering#[phone] provided: none; patient intubated, sedated. [place] in [place]: No   Ostomy supplies in room for future use.  SVal Riles RN, MSN, CWOCN, CNS-BC, pager [phone]

## 2022-04-03 NOTE — Progress Notes (Signed)
[place] Progress Note Patient Name: Helen Hardy DOB: 09/07/1950 MRN: [id number]   Date of Service  04/03/2022  HPI/Events of Note  Agitation - Patient chewing on ETT. Precedex and Fentanyl IV infusions at ceiling doses.   eICU Interventions  Plan: Increase the ceiling on the Fentanyl IV infusion to 400 mcg/hour. Titrate to RASS = 0 to -1.     Intervention Category Major Interventions: Delirium, psychosis, severe agitation - evaluation and management  Helen Hardy 04/03/2022, 11:40 PM

## 2022-04-03 NOTE — Progress Notes (Signed)
ANTICOAGULATION CONSULT NOTE   Pharmacy Consult for Argatroban Indication: r/o HIT  Allergies  Allergen Reactions   Heparin Other (See Comments)    R/o HIT (HIT antb ordered on 04/03/22)   Roxicodone [Oxycodone] Nausea And Vomiting    Can tolerate with antiemetics    Ultram [Tramadol] Nausea And Vomiting    Patient Measurements: Height: 4' 11"  (149.9 cm) Weight: 113.6 kg (250 lb 7.1 oz) IBW/kg (Calculated) : 43.2  Vital Signs: Temp: 98.6 F (37 C) (09/29 1900) BP: 137/88 (09/29 1900) Pulse Rate: 81 (09/29 1900)  Labs: Recent Labs    03/15/2022 0341 03/11/2022 2014 04/02/22 0419 04/02/22 1234 04/03/22 0317 04/03/22 1427 04/03/22 1751 04/03/22 2210  HGB 8.1* 9.2*  --  8.5* 8.2*  --   --   --   HCT 24.9* 28.0*  --  25.6* 24.7*  --   --   --   PLT 106*  --   --  78* 61*  --   --   --   APTT  --   --   --   --   --  >200* 67* 68*  LABPROT  --   --  17.3*  --   --   --   --   --   INR  --   --  1.4*  --   --   --   --   --   CREATININE 1.79*  --  1.81*  --  1.67*  --   --   --      Estimated Creatinine Clearance: 34.3 mL/min (A) (by C-G formula based on SCr of 1.67 mg/dL (H)).   Assessment: Patient is a 71 y.o F with hx non-Hodgkin's lymphoma and anemia who presented to the ED on 03/22/2022 with c/o abdominal pain, n/v and diarrhea.  She was found to be septic with perforated diverticulitis.  She underwent  sigmoidectomy, bladder repair and open abdominal vac placement on 03/28/2022.  She returned to the OR on 03/13/2022 for exp lap with ostomy and debridement closure/abdominal wound. Heparin SQ started on 03/30/22 for VTE prophylaxis.  Platelets were 179K on 03/28/2022 with levels trending each day since admission.  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  1st aPTT level was therapeutic at 67 sec  No bleeding or issues per RN HIT Ab still pending  2nd shift f/u: Confirmatory aPTT = 68 sec (therapeutic) No complications of therapy  noted HIT Ab still pending  Goal of Therapy:  aPTT 50-90 seconds Monitor platelets by anticoagulation protocol: Yes   Plan:  Continue current argatroban rate of 0.5 mcg/kg/min Daily aPTT and CBC Monitor plts closely Monitor for s/sx bleeding f/u with HIT antb    Leone Haven, PharmD 04/03/2022 10:45 PM

## 2022-04-03 NOTE — Progress Notes (Signed)
Nutrition Follow-up  DOCUMENTATION CODES:   Morbid obesity  INTERVENTION:   Monitor magnesium, potassium, and phosphorus BID for at least 3 days, MD to replete as needed, as pt is at risk for refeeding syndrome. Given reports from family pt was not eating well PTA.  -TPN management per Pharmacy  If tube feeding resumed: -Vital HP @ 20 ml/hr via OGT, advance by 10 ml every 12 hours to goal of 45 ml/hr w/ 60 ml Prosource 20 TF daily   NUTRITION DIAGNOSIS:   Inadequate oral intake related to inability to eat as evidenced by NPO status.  Ongoing.  GOAL:   Provide needs based on ASPEN/SCCM guidelines  Progressing.  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  She returned form the operating room early AM 9/25 with septic shock and remained on mechanical ventilatory support.  9/24: admitted, s/p ex lap, sigmoid colectomy, bladder repair, Wound VAC, remained intubated 9/27: s/p ex lap, ostomy, debridement, closure of abdominal wound 9/28: trickle feeds of Vital HP started at 20 ml/hr 9/29: TF stopped, TPN initiation  Pt started trickle feeds of Vital HP yesterday via OGT, had some bilious emesis and TF was stopped. TPN to be started today. Will leave TF recommendations above if trickle feeds are able to be resumed.  TPN to begin at 40 ml/hr per Pharmacy, providing 756 kcals and 65g protein.  Patient is currently intubated on ventilator support MV: 8 L/min Temp (24hrs), Avg:98.2 F (36.8 C), Min:97.5 F (36.4 C), Max:99.9 F (37.7 C)  Medications: Colace, Lasix, Precedex, Fentanyl  Labs reviewed: CBGs: 155-164  Diet Order:    Diet Order             Diet NPO time specified  Diet effective now                   EDUCATION NEEDS:   No education needs have been identified at this time  Skin:  Skin Assessment: Skin Integrity Issues: Skin Integrity Issues:: Incisions Incisions: 9/24: abdomen, 9/27: abdomen  Last BM:  9/24  Height:   Ht Readings from Last 1 Encounters:  03/22/2022 4' 11"  (1.499 m)    Weight:   Wt Readings from Last 1 Encounters:  03/12/2022 103 kg    BMI:  Body mass index is 45.86 kg/m.  Estimated Nutritional Needs:   Kcal:  1200-1450  Protein:  >111g  Fluid:  1.6L/day  Clayton Bibles, MS, RD, LDN Inpatient Clinical Dietitian Contact information available via Amion

## 2022-04-03 NOTE — Progress Notes (Signed)
PHARMACY - TOTAL PARENTERAL NUTRITION CONSULT NOTE   Indication: Prolonged ileus  Patient Measurements: Height: 4' 11"  (149.9 cm) Weight: 103 kg (227 lb 1.2 oz) IBW/kg (Calculated) : 43.2   Body mass index is 45.86 kg/m. Usual Weight:   Assessment: Patient is a 71 y.o F with hx non-Hodgkin's lymphoma and anemia who presented to the ED on 03/08/2022 with c/o abdominal pain, n/v and diarrhea.  She was found to be septic with perforated diverticulitis.  She underwent  sigmoidectomy, bladder repair and open abdominal vac placement on 03/12/2022.  She returned to the OR on 03/24/2022 for exp lap with ostomy and debridement closure/abdominal wound. Pharmacy has been consulted on 9/29 to start TPN for prolonged ileus.  Glucose / Insulin: on rSSI q4h - cbgs (goal <150): 141-180 - on Solu-cortef 50 mg q8h Electrolytes: K 4.2, Na 143, Mag 2.4, phos 3.1; CorrCa 9; CL high 117, CO2 low 20 -  goal mag >2, K >4, phos ~3 for ileus Renal: scr elevated but trending down 1.67 (crcl~32), BUN elevated 51 Hepatic: LFTs wnl Intake / Output; MIVF:  - NaCL @ KVO - I/O: +232 mL - NG/GT: 122 mL GI Imaging: - 9/24 abd CT angio: sigmoid diverticular abscess. Perforated sigmoid diverticulitis, with moderate amount of free intraperitoneal air. Bilateral lower lobe infiltrates. GI Surgeries / Procedures:  - 9/24: ex lap, Sigmoidectomy, bladder repair, open abdominal vac placement - 9/27: exp lap with ostomy and debridement closure/abdominal wound  Central access: CVC TPN start date: 04/03/22  Nutritional Goals: Goal TPN rate is 70 mL/hr (provides 114 g of protein and 1323 kcals per day)  RD Assessment: Estimated Needs Total Energy Estimated Needs: 1200-1450 Total Protein Estimated Needs: >111g Total Fluid Estimated Needs: 1.6L/day  Current Nutrition:  - TF started on 9/28 >> 9/29 AM - NPO - Starting TPN on 9/29  Plan:   At 1800: - Start TPN at 40 mL/hr at 1800 - Electrolytes in TPN:  Na 47mq/L K  556m/L Ca 52m73mL Mg 2 mEq/L Phos 152m21mL Cl:Ac 1:2 - Add standard MVI and trace elements to TPN - continue Resistant q4h SSI and adjust as needed  - MIVF @ KVO - Monitor TPN labs on Mon/Thurs,  - Renal panel ordered by MD thru 10/1  Candice Tobey P 04/03/2022,10:12 AM

## 2022-04-03 NOTE — Progress Notes (Signed)
Peripherally Inserted Central Catheter Placement  The IV Nurse has discussed with the patient and/or persons authorized to consent for the patient, the purpose of this procedure and the potential benefits and risks involved with this procedure.  The benefits include less needle sticks, lab draws from the catheter, and the patient may be discharged home with the catheter. Risks include, but not limited to, infection, bleeding, blood clot (thrombus formation), and puncture of an artery; nerve damage and irregular heartbeat and possibility to perform a PICC exchange if needed/ordered by physician.  Alternatives to this procedure were also discussed.  Bard Power PICC patient education guide, fact sheet on infection prevention and patient information card has been provided to patient /or left at bedside.    Consent obtained with spouse at bedside  PICC Placement Documentation  PICC Triple Lumen 04/03/22 Right Brachial 40 cm 3 cm (Active)  Indication for Insertion or Continuance of Line Administration of hyperosmolar/irritating solutions (i.e. TPN, Vancomycin, etc.) 04/03/22 1700  Exposed Catheter (cm) 3 cm 04/03/22 1700  Site Assessment Clean, Dry, Intact 04/03/22 1700  Lumen #1 Status Flushed;Saline locked;Blood return noted 04/03/22 1700  Lumen #2 Status Flushed;Saline locked;Blood return noted 04/03/22 1700  Lumen #3 Status Flushed;Saline locked;Blood return noted 04/03/22 1700  Dressing Type Transparent;Securing device 04/03/22 1700  Dressing Status Antimicrobial disc in place;Clean, Dry, Intact 04/03/22 1700  Safety Lock Not Applicable 14/38/88 7579  Line Care Connections checked and tightened 04/03/22 1700  Dressing Intervention New dressing 04/03/22 1700  Dressing Change Due 04/09/22 04/03/22 1700       Darlyn Read 04/03/2022, 5:45 PM

## 2022-04-03 NOTE — Progress Notes (Signed)
Helen Hardy, MRN:  [id number], DOB:  [birth year], LOS: 5 ADMISSION DATE:  03/07/2022, CONSULTATION DATE:  9/24 REFERRING MD:  Donne Hazel, CHIEF COMPLAINT:  abdominal pain   History of Present Illness:  71 y/o female with extensive past medical history including rheumatoid arthritis on multiple immunosuppressants admitted with septic shock from perforated diverticulitis with bladder fistula requiring emergent ex-lap for sigmoid colectomy and bladder repair on 9/24.  She returned form the operating room early AM 9/25 with septic shock and remained on mechanical ventilatory support.    Pertinent  Medical History  Rheumatoid arthritis GERD Anemia of chronic disease Hyeprtension Non-hodgkin's lymphoma Prior ischemic colitis  Significant Hospital Events: Including procedures, antibiotic start and stop dates in addition to other pertinent events   9/24 admission for septic shock from perforated diverticulitis, emergent ex-lap with Dr. Donne Hazel for sigmoidectomy and bladder repair, brought to the ICU Blood culture 03/17/2022>> BCID staph species. Aureus not detected >>> Urine culture 03/28/2022>>  9/24 cefepime x 1 9/24 vanc  x 1 9/24 flagyl x 1 9/24 zosyn >> 9/25 micafungin >  9/27 back to OR for ex lap, ostomy, debridement/ closure of abd wound 9/28 off NE  Interim History / Subjective:  Weaned intermittently yesterday, limited by agitation. Questionable emesis overnight, bilious drainage suctioned from mouth Worsening thrombocytopenia Afebrile   Objective   Blood pressure 135/81, pulse 67, temperature 97.9 F (36.6 C), resp. rate 20, height 4' 11"  (1.499 m), weight 103 kg, SpO2 91 %. CVP:  [2 mmHg-4 mmHg] 2 mmHg  Vent Mode: PRVC FiO2 (%):  [30 %] 30 % Set Rate:  [20 bmp] 20 bmp Vt Set:  [340 mL] 340 mL PEEP:  [5 cmH20] 5 cmH20 Plateau Pressure:  [16 cmH20-19 cmH20] 16 cmH20   Intake/Output Summary (Last 24 hours) at 04/03/2022 4496 Last data filed at 04/03/2022  0751 Gross per 24 hour  Intake 4299.8 ml  Output 550 ml  Net 3749.8 ml   Filed Weights   03/19/2022 2212  Weight: 103 kg    Examination: Fentanyl 200 Precedex 1.2 General:  obese Elderly female lying in bed in NAD  HEENT: MM pink/moist, pupils 3/reactive, ETT/ OGT Neuro: will open eyes and attempts to track, follows simple intermittent commands, MAE weakly CV: rr, no obv murmur PULM:  non labored on full MV support, clear and diminished in bases, scant secretions, no wheeze GI: obese, better BS today, no obvious tenderness, midline incision with wound vac in place, left ostomy stoma red, foley Extremities: warm/dry, no pitting edema  Skin: no rashes    UOP 6465m/ 24hrs +3.6L Net +15.4  Afebrile Labs reviewed   Resolved Hospital Problem list     Assessment & Plan:  Acute respiratory failure with hypoxemia due to septic shock - cont full MV support with daily SBT/ WUA - VAP/ PPI - PAD protocol> continue to minimize  - CXR> reviewed, rotated flim, stable ETT/ OGT/ CVL, layering effusions, bibasilar opacities, likely atelectasis> will start lasix today  Septic shock due to perforated diverticulitis Immunocompromised Entero-vesicula fistula s/p repair On steroids, 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 - remains off pressors since 9/28, continue midodrine 540mTID for goal MAP > 65 - d/c femoral aline> positional - decrease Hydrocortisone 100> 5043mV q8h - Continue zosyn and micafungin, day 6/x.  Remains afebrile and WBC.  Will defer abx to Surgery but consider stopping 9/30? - BC> 1/4 staph hominis, likely contaminant, final> no growth  S/p Ex-lap for sigmoid colectomy and bladder repair ?ileus -  s/p OR 9/27 for ostomy creation and abdominal closure.  - s/p first wound vac change with CCS/ WOC 9/29 - Wound vac per primary service - do not remove foley - given question of emesis and large gastric bubble on CXR> hold TF for further CCS input, ?ileus  although bowel sounds are better today.  No reported outpt yet from ostomy  Chronic pain, narcotic dependence at baseline Need for sedation for mechanical ventilation - RASS goal 0/-1 - better mental status today, intermittently following commands, less agitated - continue to minimize sedation, PAD protocol> fentanyl/ precedex as able for weaning efforts, goal RASS 0/-1.  Stop versed - continue reduced doses of home oxy IR 42m q 6 and gabapentin for pain, aggressive bowel regimen  HFrEF: Acuity uncertain. LVEF 35-45%. No prior to compare.  Demand myocardial ischemia due to septic shock: trop down-trending. - cont tele monitoring - lasix today   Oliguric AKI Baseline CKD Metabolic acidosis from lactic acidosis from septic shock, improving - sCr better today, UOP ~0.2 ml/kg/hr - stop MIVF - overall net +15L.  Will start diuresing - Trend BMP /mag/ urinary output - Replace electrolytes as indicated - Avoid nephrotoxic agents, ensure adequate renal perfusion  - do not remove foley per CCS  Anemia due to Fe deficiency - trending CBC.  Hgb stable, no evidence of bleeding, will continue to monitor - transfuse for Hgb <7 - s/p aranesp 9/28, and 1 unit of PRBC 9/27  Thrombocytopenia - progressive decline in plts. 89 on admit, 89yest, now 89- Dr. EMarin Olpis following, as she follows with them outpt.  Holding heparin for VTE, checking HIT panel.  He will review smear.  No evidence of bleeding/ clotting.  Could be related to sepsis.  Direct thrombin inhibitor > argatroban to be added for VTE ppx  Hyperglycemia - SSI resistant.  Holding TF today>therefore will not add basal coverage  RA - hold prednisone, orencia, and leflunomide    Prognosis remains guarded, high risk of death given multi-organ failure and baseline comorbid illnesses  Best Practice (right click and "Reselect all SmartList Selections" daily)   Diet/type: NPO; holding TF today DVT prophylaxis: other> argatroban,  HIT panel pending GI prophylaxis: PPI Lines: Central line and yes and it is still needed> R femoral aline pending removal Foley:  Yes, and it is still needed> do not remove per surgery Code Status:  full code Last date of multidisciplinary goals of care discussion [per primary, family updated 9/25]  Husband updated at bedside 9/29.  Labs   CBC: Recent Labs  Lab 03/14/2022 1435 03/31/2022 1819 03/18/2022 2047 03/30/22 0416 03/31/22 0036 03/31/22 1444 03/19/2022 0341 03/26/2022 2014 04/02/22 1234 04/03/22 0317  WBC 4.3 3.1*  --  6.8 8.4  --  12.3*  --  11.6* 8.5  NEUTROABS 1.3* 1.1*  --   --   --   --   --   --   --   --   HGB 12.4 11.9*   < > 10.5* 8.9* 8.4* 8.1* 9.2* 8.5* 8.2*  HCT 40.0 38.8   < > 33.8* 28.1* 25.8* 24.9* 28.0* 25.6* 24.7*  MCV 112.0* 112.8*  --  112.3* 108.5*  --  107.8*  --  98.1 99.2  PLT 196 179  --  142* 118*  --  106*  --  78* 61*   < > = values in this interval not displayed.    Basic Metabolic Panel: Recent Labs  Lab 03/31/22 0036 03/31/22 1444 03/06/2022 0341  04/02/22 0419 04/03/22 0317  NA 143 142 143 144 143  K 4.8 3.8 3.8 3.7 4.2  CL 115* 114* 113* 115* 117*  CO2 17* 20* 23 21* 20*  GLUCOSE 84 140* 155* 152* 180*  BUN 28* 31* 35* 43* 51*  CREATININE 2.18* 1.86* 1.79* 1.81* 1.67*  CALCIUM 7.7* 7.3* 7.1* 7.0* 7.4*  MG  --   --  1.8 2.4 2.4  PHOS  --   --  3.8 3.6 3.1   GFR: Estimated Creatinine Clearance: 32.3 mL/min (A) (by C-G formula based on SCr of 1.67 mg/dL (H)). Recent Labs  Lab 03/20/2022 1820 03/30/2022 2345 03/30/22 0416 03/30/22 0500 03/31/22 0036 03/07/2022 0341 04/02/22 1234 04/03/22 0317  WBC  --   --    < >  --  8.4 12.3* 11.6* 8.5  LATICACIDVEN 3.8* 3.9*  --  3.4*  --   --   --   --    < > = values in this interval not displayed.    Liver Function Tests: Recent Labs  Lab 03/08/2022 1435 03/30/22 0416 03/31/22 0036 03/31/2022 0341 04/02/22 0419 04/03/22 0317  AST 25 39 56* 59* 39  --   ALT 17 23 32 45* 39  --   ALKPHOS  72 52 49 63 59  --   BILITOT 0.7 0.7 1.3* 1.0 0.6  --   PROT 7.5 4.8* 5.6* 5.0* 4.5*  --   ALBUMIN 3.6 2.2* 3.0* 2.3* 2.0* 2.0*   Recent Labs  Lab 03/21/2022 1435  LIPASE 22   No results for input(s): "AMMONIA" in the last 168 hours.  ABG    Component Value Date/Time   PHART 7.4 03/19/2022 0408   PCO2ART 35 03/22/2022 0408   PO2ART 91 03/19/2022 0408   HCO3 21.7 03/07/2022 0408   TCO2 16 (L) 03/13/2022 2134   ACIDBASEDEF 2.7 (H) 03/31/2022 0408   O2SAT 97.4 03/27/2022 0408     Coagulation Profile: Recent Labs  Lab 03/18/2022 1819 04/02/22 0419  INR 1.3* 1.4*    Cardiac Enzymes: No results for input(s): "CKTOTAL", "CKMB", "CKMBINDEX", "TROPONINI" in the last 168 hours.  HbA1C: Hgb A1c MFr Bld  Date/Time Value Ref Range Status  03/30/2022 09:30 AM 5.5 4.8 - 5.6 % Final    Comment:    (NOTE) Pre diabetes:          5.7%-6.4%  Diabetes:              >6.4%  Glycemic control for   <7.0% adults with diabetes   12/26/2019 05:30 AM 5.7 (H) 4.8 - 5.6 % Final    Comment:    (NOTE)         Prediabetes: 5.7 - 6.4         Diabetes: >6.4         Glycemic control for adults with diabetes: <7.0     CBG: Recent Labs  Lab 04/02/22 1138 04/02/22 1542 04/02/22 1941 04/02/22 2355 04/03/22 0311  GLUCAP 127* 115* 141* 167* 164*   Critical care time: 35 minutes       Kennieth Rad, MSN, AG-ACNP-BC [facility] 04/03/2022, 8:07 AM  See Amion for pager If no response to pager, please call PCCM consult pager After 7:00 pm call Elink

## 2022-04-04 DIAGNOSIS — A419 Sepsis, unspecified organism: Secondary | ICD-10-CM

## 2022-04-04 DIAGNOSIS — J9601 Acute respiratory failure with hypoxia: Secondary | ICD-10-CM | POA: Diagnosis not present

## 2022-04-04 DIAGNOSIS — K668 Other specified disorders of peritoneum: Secondary | ICD-10-CM | POA: Diagnosis not present

## 2022-04-04 LAB — RENAL FUNCTION PANEL
Albumin: 2 g/dL — ABNORMAL LOW (ref 3.5–5.0)
Anion gap: 7 (ref 5–15)
BUN: 69 mg/dL — ABNORMAL HIGH (ref 8–23)
CO2: 22 mmol/L (ref 22–32)
Calcium: 7.6 mg/dL — ABNORMAL LOW (ref 8.9–10.3)
Chloride: 115 mmol/L — ABNORMAL HIGH (ref 98–111)
Creatinine, Ser: 2.37 mg/dL — ABNORMAL HIGH (ref 0.44–1.00)
GFR, Estimated: 21 mL/min — ABNORMAL LOW (ref >60)
Glucose, Bld: 166 mg/dL — ABNORMAL HIGH (ref 70–99)
Phosphorus: 4.5 mg/dL (ref 2.5–4.6)
Potassium: 3.8 mmol/L (ref 3.5–5.1)
Sodium: 144 mmol/L (ref 135–145)

## 2022-04-04 LAB — CBC
HCT: 22.3 % — ABNORMAL LOW (ref 36.0–46.0)
Hemoglobin: 7.3 g/dL — ABNORMAL LOW (ref 12.0–15.0)
MCH: 33 pg (ref 26.0–34.0)
MCHC: 32.7 g/dL (ref 30.0–36.0)
MCV: 100.9 fL — ABNORMAL HIGH (ref 80.0–100.0)
Platelets: 56 10*3/uL — ABNORMAL LOW (ref 150–400)
RBC: 2.21 MIL/uL — ABNORMAL LOW (ref 3.87–5.11)
RDW: 19.9 % — ABNORMAL HIGH (ref 11.5–15.5)
WBC: 10.1 10*3/uL (ref 4.0–10.5)
nRBC: 1.9 % — ABNORMAL HIGH (ref 0.0–0.2)

## 2022-04-04 LAB — TYPE AND SCREEN
ABO/RH(D): O POS
Antibody Screen: NEGATIVE
Unit division: 0
Unit division: 0

## 2022-04-04 LAB — GLUCOSE, CAPILLARY
Glucose-Capillary: 163 mg/dL — ABNORMAL HIGH (ref 70–99)
Glucose-Capillary: 175 mg/dL — ABNORMAL HIGH (ref 70–99)
Glucose-Capillary: 161 mg/dL — ABNORMAL HIGH (ref 70–99)
Glucose-Capillary: 168 mg/dL — ABNORMAL HIGH (ref 70–99)
Glucose-Capillary: 175 mg/dL — ABNORMAL HIGH (ref 70–99)

## 2022-04-04 LAB — BPAM RBC
Blood Product Expiration Date: 202310272359
Blood Product Expiration Date: 202310282359
ISSUE DATE / TIME: 202309271331
Unit Type and Rh: 5100
Unit Type and Rh: 5100

## 2022-04-04 LAB — HEPARIN INDUCED PLATELET AB (HIT ANTIBODY): Heparin Induced Plt Ab: 0.046 OD (ref 0.000–0.400)

## 2022-04-04 LAB — HEMOGLOBIN AND HEMATOCRIT, BLOOD
HCT: 31.2 % — ABNORMAL LOW (ref 36.0–46.0)
Hemoglobin: 10.3 g/dL — ABNORMAL LOW (ref 12.0–15.0)

## 2022-04-04 LAB — APTT
aPTT: 64 seconds — ABNORMAL HIGH (ref 24–36)
aPTT: 57 seconds — ABNORMAL HIGH (ref 24–36)

## 2022-04-04 LAB — PREPARE RBC (CROSSMATCH)

## 2022-04-04 LAB — TRIGLYCERIDES: Triglycerides: 172 mg/dL — ABNORMAL HIGH (ref <150)

## 2022-04-04 LAB — MAGNESIUM: Magnesium: 2.3 mg/dL (ref 1.7–2.4)

## 2022-04-04 IMAGING — DX DG KNEE 1-2V PORT*R*
2 series · 2 of 2 positions shown · non-contrast
Comparison: 02/21/2021

CLINICAL DATA: Right knee pain.  Preoperative exam

EXAM:
PORTABLE RIGHT KNEE - 1-2 VIEW

[knee lat]
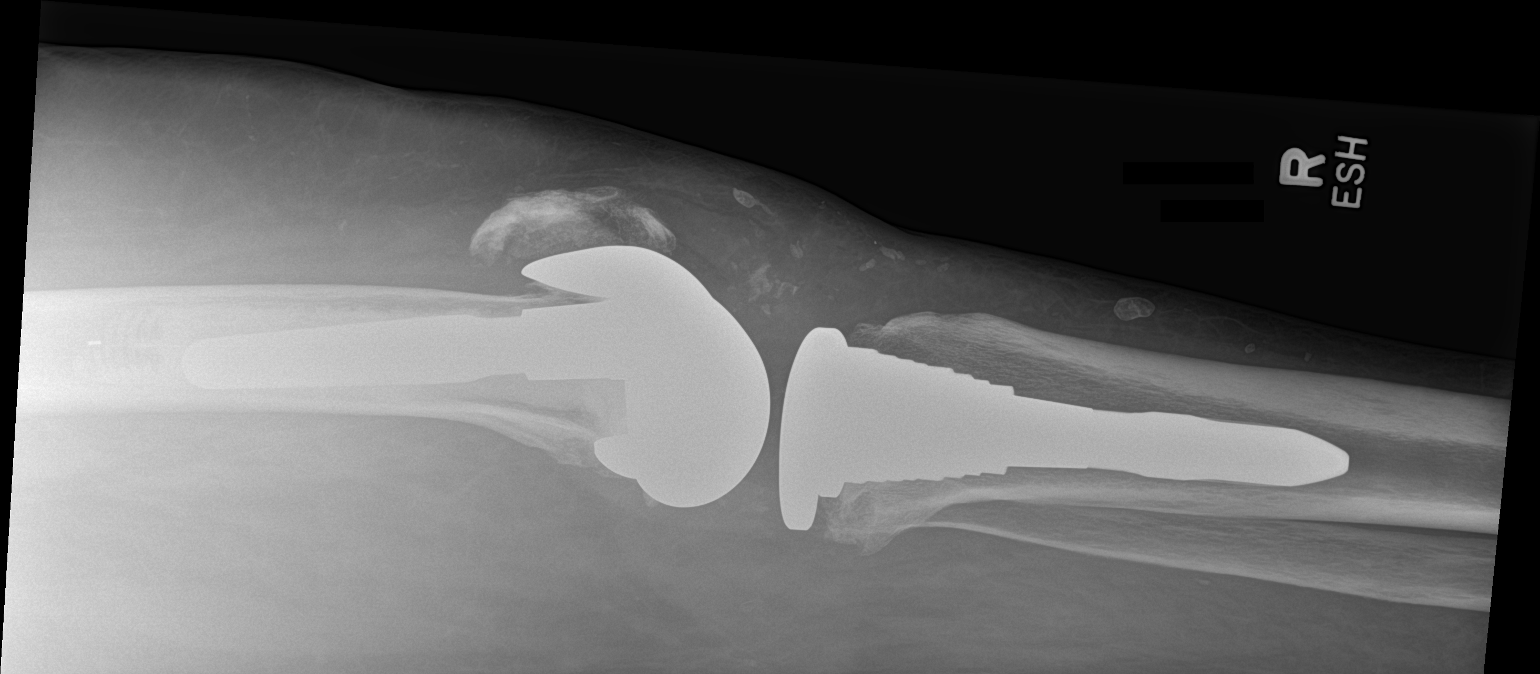

[knee ap]
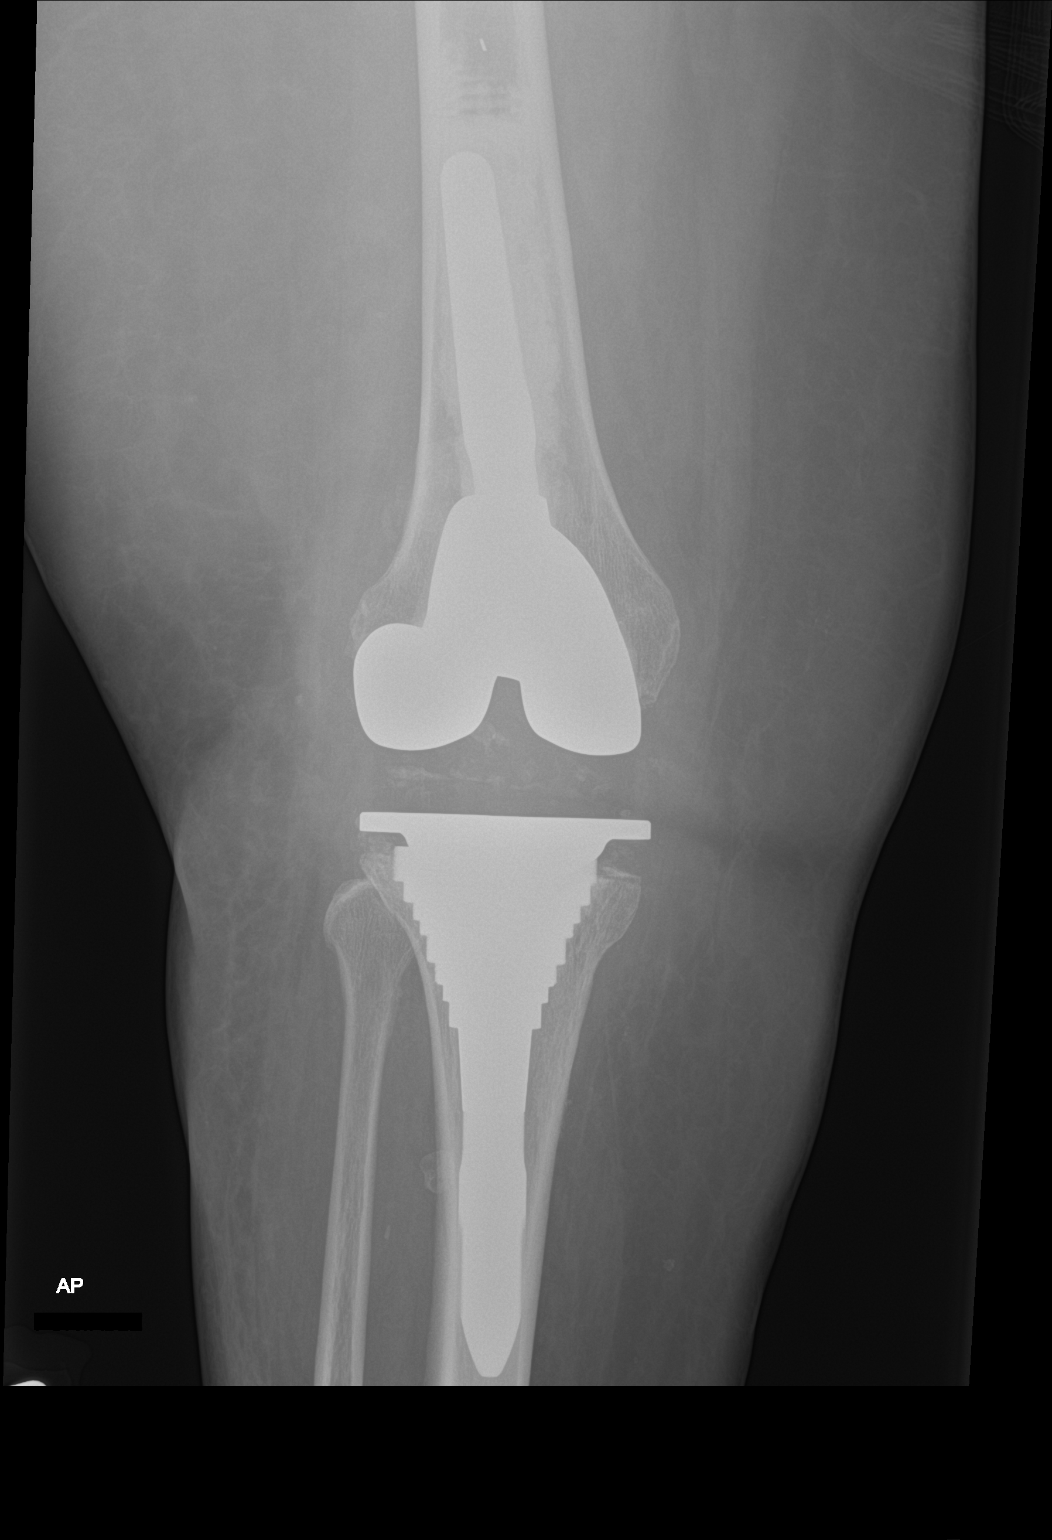

[2 of 2 positions shown; findings below may reference images not displayed]

FINDINGS: Status post right total knee arthroplasty revision with long stem
tibial and femoral components. Unchanged hardware alignment compared
to prior, with the tibial component appearing slightly proud.
Patella Alta alignment. No periprosthetic fracture. Probable small
joint effusion. Nonspecific soft tissue calcifications anterior to
the knee. Diffuse soft tissue prominence.
IMPRESSION: Unchanged hardware alignment status post right total knee
arthroplasty revision. No periprosthetic fracture.

## 2022-04-04 MED ORDER — MIDAZOLAM HCL 2 MG/2ML IJ SOLN
2.0000 mg | Freq: Once | INTRAMUSCULAR | Status: AC
  Administered 2022-04-04: 2 mg via INTRAVENOUS
  Filled 2022-04-04: qty 2

## 2022-04-04 MED ORDER — MIDAZOLAM HCL 2 MG/2ML IJ SOLN: 2.0000 mg | Freq: Once | INTRAMUSCULAR | Status: DC

## 2022-04-04 MED ORDER — ARTIFICIAL TEARS OPHTHALMIC OINT
TOPICAL_OINTMENT | Freq: Three times a day (TID) | OPHTHALMIC | Status: DC
  Filled 2022-04-04: qty 3.5

## 2022-04-04 MED ORDER — MIDAZOLAM HCL 2 MG/2ML IJ SOLN
2.0000 mg | INTRAMUSCULAR | Status: DC | PRN
  Administered 2022-04-04 – 2022-04-06 (×14): 2 mg via INTRAVENOUS
  Filled 2022-04-04 (×15): qty 2

## 2022-04-04 MED ORDER — TRAVASOL 10 % IV SOLN
INTRAVENOUS | Status: AC
  Filled 2022-04-04: qty 897.6

## 2022-04-04 MED ORDER — FUROSEMIDE 10 MG/ML IJ SOLN: 40.0000 mg | Freq: Once | INTRAMUSCULAR | Status: DC

## 2022-04-04 MED ORDER — ACETAMINOPHEN 160 MG/5ML PO SOLN: 650.0000 mg | Freq: Four times a day (QID) | ORAL | Status: DC | PRN

## 2022-04-04 MED ORDER — FUROSEMIDE 10 MG/ML IJ SOLN
40.0000 mg | Freq: Once | INTRAMUSCULAR | Status: AC
  Administered 2022-04-04: 40 mg via INTRAVENOUS
  Filled 2022-04-04: qty 4

## 2022-04-04 MED ORDER — SODIUM CHLORIDE 0.9% IV SOLUTION: Freq: Once | INTRAVENOUS | Status: AC

## 2022-04-04 NOTE — Progress Notes (Signed)
[employer] for Argatroban Indication: r/o HIT  Allergies  Allergen Reactions   Heparin Other (See Comments)    R/o HIT (HIT antb ordered on 04/03/22)   Roxicodone [Oxycodone] Nausea And Vomiting    Can tolerate with antiemetics    Ultram [Tramadol] Nausea And Vomiting    Patient Measurements: Height: 4' 11"  (149.9 cm) Weight: 113.7 kg (250 lb 10.6 oz) IBW/kg (Calculated) : 43.2  Vital Signs: Temp: 99.9 F (37.7 C) (09/30 0500) BP: 130/77 (09/30 0500) Pulse Rate: 66 (09/30 0500)  Labs: Recent Labs    04/02/22 0419 04/02/22 1234 04/03/22 0317 04/03/22 1427 04/03/22 1751 04/03/22 2210 04/04/22 0512  HGB  --  8.5* 8.2*  --   --   --  7.3*  HCT  --  25.6* 24.7*  --   --   --  22.3*  PLT  --  78* 61*  --   --   --  56*  APTT  --   --   --    < > 67* 68* 64*  LABPROT 17.3*  --   --   --   --   --   --   INR 1.4*  --   --   --   --   --   --   CREATININE 1.81*  --  1.67*  --   --   --  2.37*   < > = values in this interval not displayed.     Estimated Creatinine Clearance: 24.2 mL/min (A) (by C-G formula based on SCr of 2.37 mg/dL (H)).   Assessment: Patient is a 71 y.o F with hx non-Hodgkin's lymphoma and anemia who presented to the ED on 03/31/2022 with c/o abdominal pain, n/v and diarrhea.  She was found to be septic with perforated diverticulitis.  She underwent  sigmoidectomy, bladder repair and open abdominal vac placement on 03/18/2022.  She returned to the OR on 03/21/2022 for exp lap with ostomy and debridement closure/abdominal wound. Heparin SQ started on 03/30/22 for VTE prophylaxis.  Platelets were 179K on 03/18/2022 with levels trending each day since admission.  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  - 9/27: 1 unit PRBC - 9/30: 2 units PRBC ordered  Today, 04/04/2022: - aPTT is therapeutic at 64 secs - plts down 56K - hgb down 7.3 (Aranesp 300 mcg x1 given on 9/28) - no bleeding  document - HIT antb collected on 9/29 - in process   Goal of Therapy:  aPTT 50-90 seconds Monitor platelets by anticoagulation protocol: Yes   Plan:  - continue argatroban at 0.5 mcg/kg/min - check aPTT q12h at 5a and 5p while patient is in ICU - monitor plts closely and for s/sx bleeding - f/u with HIT antb   Contina Strain P 04/04/2022,7:29 AM

## 2022-04-04 NOTE — Progress Notes (Signed)
[place] Progress Note Patient Name: Maryland Stell DOB: 07-07-1950 MRN: [id number]   Date of Service  04/04/2022  HPI/Events of Note  Nursing reports that patient bites on ETT and occludes airway resulting in desaturation. Patient is heavily sedated on Precedex and Fentanyl IV infusion. Patient's eyes remain open in spite of sedation.   eICU Interventions  Plan: Place bite block. Versed 2 mg IV X 1 to place bite block.  Lacrilube ointment to both eyes now and Q 8 hours.      Intervention Category Major Interventions: Other:  Zoella Roberti Cornelia Copa 04/04/2022, 2:59 AM

## 2022-04-04 NOTE — Progress Notes (Signed)
NAMEIreanna Hardy, MRN:  [id number], DOB:  09-20-1950, LOS: 6 ADMISSION DATE:  03/10/2022, CONSULTATION DATE:  9/24 REFERRING MD:  Helen Hardy, CHIEF COMPLAINT:  abdominal pain   History of Present Illness:  71 y/o female with extensive past medical history including rheumatoid arthritis on multiple immunosuppressants admitted with septic shock from perforated diverticulitis with bladder fistula requiring emergent ex-lap for sigmoid colectomy and bladder repair on 9/24.  She returned form the operating room early AM 9/25 with septic shock and remained on mechanical ventilatory support.    Pertinent  Medical History  Rheumatoid arthritis GERD Anemia of chronic disease Hyeprtension Non-hodgkin's lymphoma Prior ischemic colitis  Significant Hospital Events: Including procedures, antibiotic start and stop dates in addition to other pertinent events   9/24 admission for septic shock from perforated diverticulitis, emergent ex-lap with Dr. Donne Hardy for sigmoidectomy and bladder repair, brought to the ICU Blood culture 03/31/2022>> BCID staph species. Aureus not detected >>> Urine culture 03/17/2022>>  9/24 cefepime x 1 9/24 vanc  x 1 9/24 flagyl x 1 9/24 zosyn >> 9/25 micafungin >  9/27 back to OR for ex lap, ostomy, debridement/ closure of abd wound 9/28 off NE 9/30 appears to be weaning  Interim History / Subjective:   Weaning Not following commands increased secretions  Objective   Blood pressure 130/77, pulse 66, temperature 99.9 F (37.7 C), resp. rate (!) 21, height 4' 11"  (1.499 m), weight 113.7 kg, SpO2 98 %.    Vent Mode: PRVC FiO2 (%):  [30 %] 30 % Set Rate:  [20 bmp] 20 bmp Vt Set:  [340 mL] 340 mL PEEP:  [5 cmH20-8 cmH20] 5 cmH20 Pressure Support:  [5 cmH20-10 cmH20] 5 cmH20 Plateau Pressure:  [15 MVH84-69 cmH20] 15 cmH20   Intake/Output Summary (Last 24 hours) at 04/04/2022 0735 Last data filed at 04/04/2022 0149 Gross per 24 hour  Intake 2453.38 ml   Output 1090 ml  Net 1363.38 ml   Filed Weights   03/17/2022 2212 04/03/22 1040 04/04/22 0500  Weight: 103 kg 113.6 kg 113.7 kg   Examination: General: Obese, elderly, chronically ill-appearing HEENT: Endotracheal tube in place, moist oral mucosa Neuro: Does not appear to be following commands  CV: S1-S2 appreciated PULM: Rhonchi GI: Wound VAC in place  extremities: warm/dry, no pitting edema  Skin: no rashes   I's/O- still significantly positive  BUN of 69, creatinine of 2.37 Platelet count of 56  Resolved Hospital Problem list     Assessment & Plan:   Acute respiratory failure with hypoxemia due to septic shock -Continue full ventilator support -Appears to be weaning this morning -Concerned with increased secretions -Will continue to monitor  Septic shock due to perforated diverticulitis Immunocompromise Enterovesicular fistula s/p repair Adrenal insufficiency at baseline -On midodrine -On hydrocortisone -Completed micafungin -On Zosyn -Blood culture with 1 out of 4 showing staph hold minutes-likely contaminant  S/p exploratory laparotomy for sigmoid colectomy and bladder repair -In OR 9/27 for ostomy creation and abdominal closure -Wound VAC per primary service -Keep Foley in place -Tube feed on hold secondary to concern for ileus  Chronic pain Narcotic dependence -Wean sedation as tolerated -Mental status still poor -We will continue to monitor closely -On oxy IR at home -Continue Oxy IR and gabapentin for pain  Heart failure with reduced ejection fraction -Left ventricular ejection fraction of 35 to 45%  Chronic kidney disease -Continue to trend electrolytes -Avoid nephrotoxic -Maintain renal perfusion  Iron deficiency anemia -Transfuse per protocol -S/p R-9/28  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 Plan  -Patient started on argatroban  Hyperglycemia Continue SSI  History of rheumatoid arthritis -Immunomodulators on hold  Prognosis is  guarded based on multiorgan dysfunction  Best Practice (right click and "Reselect all SmartList Selections" daily)   Diet/type: NPO; holding TF today DVT prophylaxis: other> argatroban, HIT panel pending GI prophylaxis: PPI Lines: Central line and yes and it is still needed> R femoral aline pending removal Foley:  Yes, and it is still needed> do not remove per surgery Code Status:  full code Last date of multidisciplinary goals of care discussion [per primary, family updated 9/25]  Husband updated at bedside 9/29.  Labs   CBC: Recent Labs  Lab 03/22/2022 1435 03/19/2022 1819 03/11/2022 2047 03/31/22 0036 03/31/22 1444 03/18/2022 0341 03/16/2022 2014 04/02/22 1234 04/03/22 0317 04/04/22 0512  WBC 4.3 3.1*   < > 8.4  --  12.3*  --  11.6* 8.5 10.1  NEUTROABS 1.3* 1.1*  --   --   --   --   --   --  7.2  --   HGB 12.4 11.9*   < > 8.9*   < > 8.1* 9.2* 8.5* 8.2* 7.3*  HCT 40.0 38.8   < > 28.1*   < > 24.9* 28.0* 25.6* 24.7* 22.3*  MCV 112.0* 112.8*   < > 108.5*  --  107.8*  --  98.1 99.2 100.9*  PLT 196 179   < > 118*  --  106*  --  78* 61* 56*   < > = values in this interval not displayed.    Basic Metabolic Panel: Recent Labs  Lab 03/31/22 1444 03/18/2022 0341 04/02/22 0419 04/03/22 0317 04/04/22 0512  NA 142 143 144 143 144  K 3.8 3.8 3.7 4.2 3.8  CL 114* 113* 115* 117* 115*  CO2 20* 23 21* 20* 22  GLUCOSE 140* 155* 152* 180* 166*  BUN 31* 35* 43* 51* 69*  CREATININE 1.86* 1.79* 1.81* 1.67* 2.37*  CALCIUM 7.3* 7.1* 7.0* 7.4* 7.6*  MG  --  1.8 2.4 2.4 2.3  PHOS  --  3.8 3.6 3.1 4.5   GFR: Estimated Creatinine Clearance: 24.2 mL/min (A) (by C-G formula based on SCr of 2.37 mg/dL (H)). Recent Labs  Lab 03/23/2022 1820 04/02/2022 2345 03/30/22 0416 03/30/22 0500 03/31/22 0036 03/31/2022 0341 04/02/22 1234 04/03/22 0317 04/04/22 0512  WBC  --   --    < >  --    < > 12.3* 11.6* 8.5 10.1  LATICACIDVEN 3.8* 3.9*  --  3.4*  --   --   --   --   --    < > = values in this  interval not displayed.    Liver Function Tests: Recent Labs  Lab 04/04/2022 1435 03/30/22 0416 03/31/22 0036 03/07/2022 0341 04/02/22 0419 04/03/22 0317 04/04/22 0512  AST 25 39 56* 59* 39  --   --   ALT 17 23 32 45* 39  --   --   ALKPHOS 72 52 49 63 59  --   --   BILITOT 0.7 0.7 1.3* 1.0 0.6  --   --   PROT 7.5 4.8* 5.6* 5.0* 4.5*  --   --   ALBUMIN 3.6 2.2* 3.0* 2.3* 2.0* 2.0* 2.0*   Recent Labs  Lab 03/07/2022 1435  LIPASE 22   No results for input(s): "AMMONIA" in the last 168 hours.  ABG    Component Value Date/Time   PHART 7.4 03/17/2022 0408   PCO2ART  35 03/31/2022 0408   PO2ART 91 03/07/2022 0408   HCO3 21.7 03/26/2022 0408   TCO2 16 (L) 04/02/2022 2134   ACIDBASEDEF 2.7 (H) 03/13/2022 0408   O2SAT 97.4 03/20/2022 0408     Coagulation Profile: Recent Labs  Lab 03/17/2022 1819 04/02/22 0419  INR 1.3* 1.4*    Cardiac Enzymes: No results for input(s): "CKTOTAL", "CKMB", "CKMBINDEX", "TROPONINI" in the last 168 hours.  HbA1C: Hgb A1c MFr Bld  Date/Time Value Ref Range Status  03/30/2022 09:30 AM 5.5 4.8 - 5.6 % Final    Comment:    (NOTE) Pre diabetes:          5.7%-6.4%  Diabetes:              >6.4%  Glycemic control for   <7.0% adults with diabetes   12/26/2019 05:30 AM 5.7 (H) 4.8 - 5.6 % Final    Comment:    (NOTE)         Prediabetes: 5.7 - 6.4         Diabetes: >6.4         Glycemic control for adults with diabetes: <7.0     CBG: Recent Labs  Lab 04/03/22 1144 04/03/22 1600 04/03/22 1955 04/03/22 2341 04/04/22 0351  GLUCAP 139* 145* 153* 162* 163*   The patient is critically ill with multiple organ systems failure and requires high complexity decision making for assessment and support, frequent evaluation and titration of therapies, application of advanced monitoring technologies and extensive interpretation of multiple databases. Critical Care Time devoted to patient care services described in this note independent of APP/resident  time (if applicable)  is 35 minutes.   Sherrilyn Rist MD [facility] Personal pager: See Amion If unanswered, please page CCM On-call: [phone]

## 2022-04-04 NOTE — Progress Notes (Addendum)
PHARMACY - TOTAL PARENTERAL NUTRITION CONSULT NOTE   Indication: Prolonged ileus  Patient Measurements: Height: 4' 11"  (149.9 cm) Weight: 113.7 kg (250 lb 10.6 oz) IBW/kg (Calculated) : 43.2   Body mass index is 50.63 kg/m. Usual Weight:   Assessment: Patient is a 71 y.o F with hx non-Hodgkin's lymphoma and anemia who presented to the ED on 03/19/2022 with c/o abdominal pain, n/v and diarrhea.  She was found to be septic with perforated diverticulitis.  She underwent  sigmoidectomy, bladder repair and open abdominal vac placement on 03/31/2022.  She returned to the OR on 03/31/2022 for exp lap with ostomy and debridement closure/abdominal wound. Pharmacy has been consulted on 9/29 to start TPN for prolonged ileus.  Glucose / Insulin: on rSSI q4h (used 11 units since TPN started at 6p yesterday) - cbgs (goal <150): 162-166 - on Solu-cortef 50 mg q8h Electrolytes:  - Na, Mag and CorrCa, CO2 wnl - phos increased to 4.5, K 3.8, CL high 115 -  goal mag >2, K >4, phos ~3 for ileus Renal: scr up 2.37 (crcl~24), BUN elevated 69 Hepatic: LFTs wnl - TG elevated at 172 Intake / Output; MIVF:  - NaCL @ KVO - I/O: +1606 mL - drain: 40 mL - NG/GT: 747 mL GI Imaging: - 9/24 abd CT angio: sigmoid diverticular abscess. Perforated sigmoid diverticulitis, with moderate amount of free intraperitoneal air. Bilateral lower lobe infiltrates. GI Surgeries / Procedures:  - 9/24: ex lap, Sigmoidectomy, bladder repair, open abdominal vac placement - 9/27: exp lap with ostomy and debridement closure/abdominal wound  Central access: CVC TPN start date: 04/03/22  Nutritional Goals: Goal TPN rate is 70 mL/hr (provides 114 g of protein and 1323 kcals per day)  RD Assessment: Estimated Needs Total Energy Estimated Needs: 1200-1450 Total Protein Estimated Needs: >111g Total Fluid Estimated Needs: 1.6L/day  Current Nutrition:  - TF started on 9/28 >> 9/29 AM - NPO - Starting TPN on 9/29  Plan:   At  1800: - Increase TPN to 55 mL/hr at 1800 - Electrolytes in TPN:  Na 72mq/L K 569m/L Ca 13m43mL Mg 2 mEq/L Decrease Phos to 5 mmol/L Cl:Ac 1:2 - Add standard MVI and trace elements to TPN - continue Resistant q4h SSI and adjust as needed  - MIVF @ KVO per MD - Monitor TPN labs on Mon/Thurs,  - Renal panel ordered by MD thru 10/1  Helen Hardy P 04/04/2022,7:09 AM

## 2022-04-04 NOTE — Progress Notes (Signed)
[facility] Progress Note Patient Name: Helen Hardy DOB: February 07, 1951 MRN: [id number]   Date of Service  04/04/2022  HPI/Events of Note  Agitation - Presently on Fentanyl and Precedex IV infusions at ceiling doses.   eICU Interventions  Plan: Versed 2 mg IV now.      Intervention Category Major Interventions: Delirium, psychosis, severe agitation - evaluation and management  [name] 04/04/2022, 7:10 AM

## 2022-04-04 NOTE — Progress Notes (Signed)
Pharmacy Antibiotic Note  Helen Hardy is a 71 y.o. female  with hx RA on chronic steroid, non-Hodgkin's lymphoma and anemia who presented to the ED on 03/18/2022 with c/o abdominal pain, n/v and diarrhea.  She was found to be septic with perforated diverticulitis.  She underwent  sigmoidectomy, bladder repair and open abdominal vac placement on 03/23/2022.  She returned to the OR on 03/18/2022 for exp lap with ostomy and debridement closure/abdominal wound. She's currently on zosyn for broad coverage.  Today, 04/04/2022: - day # 6 of abx - Tmax 100, WBC 10.1 - scr up 2.37 (crcl~24.2) - UOP 0.4 ml/kg/hr  Plan: - continue zosyn 3.375 gm IV q8h (infuse over 4 hrs) - monitor renal function closely  _______________________________________  Height: 4' 11"  (149.9 cm) Weight: 113.7 kg (250 lb 10.6 oz) IBW/kg (Calculated) : 43.2  Temp (24hrs), Avg:98.6 F (37 C), Min:96.1 F (35.6 C), Max:100 F (37.8 C)  Recent Labs  Lab 03/10/2022 1820 03/13/2022 2345 03/30/22 0416 03/30/22 0500 03/30/22 1100 03/31/22 0036 03/31/22 1444 03/26/2022 0341 04/02/22 0419 04/02/22 1234 04/03/22 0317 04/04/22 0512  WBC  --   --    < >  --   --  8.4  --  12.3*  --  11.6* 8.5 10.1  CREATININE  --   --    < >  --    < > 2.18* 1.86* 1.79* 1.81*  --  1.67* 2.37*  LATICACIDVEN 3.8* 3.9*  --  3.4*  --   --   --   --   --   --   --   --    < > = values in this interval not displayed.    Estimated Creatinine Clearance: 24.2 mL/min (A) (by C-G formula based on SCr of 2.37 mg/dL (H)).    Allergies  Allergen Reactions   Heparin Other (See Comments)    R/o HIT (HIT antb ordered on 04/03/22)   Roxicodone [Oxycodone] Nausea And Vomiting    Can tolerate with antiemetics    Ultram [Tramadol] Nausea And Vomiting   9/24 cefepime x1 9/24 zosyn>> 9/25 Micafungin>>9/29   9/25 MRSA pcr (+) 9/25 ucx: ngf 9/24 bcx x 2: 1 bottle with GPC in clusters (BCID= staph species) FINAL --> suspects contaminant   Thank you for  allowing pharmacy to be a part of this patient's care.  Lynelle Doctor 04/04/2022 7:39 AM

## 2022-04-04 NOTE — Consult Note (Signed)
[name] is still intubated.  She is somewhat agitated this morning.  Her platelet count is stable from my point of view.  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  We sent of the studies for this.  Her platelet count was trending downward.    I looked at her blood smear.  Her platelets are few in number but most are quite large.  This tells me that she has a consumptive process.  Her bone marrow is making platelets.  Hopefully, if the thrombocytopenia is from HITT, we should start to see the platelets come up in the next couple days.  Her hemoglobin is dropped.  I do not think she is bleeding as far as I can tell for the nurses say.  Her hemoglobin is 7.3.  Her white cell count is 10.1.  Her sodium is 144.  Potassium 3.8.  BUN 69 creatinine 2.37.  Calcium is 7.6.  The creatinine has gone up quite a bit.  Again, this is somewhat troublesome.  Again, when I looked at her blood smear I did not see any schistocytes.  Her iron studies that were done yesterday showed a saturation of 39%.  I know that she did get a dose of Aranesp.  I suspect that she is pregnant need to be transfused.  I do still think she is going to have much of a erythropoietic capacity for making blood cells.  Her husband was with this today.  I gave him an update.  He is always so nice.  Both he and she have such a strong faith.  I know that she is getting remarkable and compassionate care from all the staff in the ICU.  Lattie Haw, MD  Darlyn Chamber 17:14

## 2022-04-04 NOTE — Progress Notes (Signed)
[facility] Progress Note Patient Name: Helen Hardy DOB: [birth year] MRN: [id number]   Date of Service  04/04/2022  HPI/Events of Note  Temp = 100.0 - Nursing request for Tylenol. AST and ALT both normal.   eICU Interventions  Plan: Tylenol Liquid 650 mg per tube Q 6 hours PRN Temp > 101.0 F.     Intervention Category Major Interventions: Other:  Lysle Dingwall 04/04/2022, 5:34 AM

## 2022-04-04 NOTE — Progress Notes (Signed)
Helen Hardy for Argatroban Indication: r/o HIT  Allergies  Allergen Reactions   Heparin Other (See Comments)    R/o HIT (HIT antb ordered on 04/03/22)   Roxicodone [Oxycodone] Nausea And Vomiting    Can tolerate with antiemetics    Ultram [Tramadol] Nausea And Vomiting    Patient Measurements: Height: 4' 11"  (149.9 cm) Weight: 113.7 kg (250 lb 10.6 oz) IBW/kg (Calculated) : 43.2  Vital Signs: Temp: 98.8 F (37.1 C) (09/30 1730) Temp Source: Axillary (09/30 1730) BP: 165/100 (09/30 1730) Pulse Rate: 99 (09/30 1518)  Labs: Recent Labs    04/02/22 0419 04/02/22 1234 04/03/22 0317 04/03/22 1427 04/03/22 2210 04/04/22 0512 04/04/22 1733  HGB  --  8.5* 8.2*  --   --  7.3*  --   HCT  --  25.6* 24.7*  --   --  22.3*  --   PLT  --  78* 61*  --   --  56*  --   APTT  --   --   --    < > 68* 64* 57*  LABPROT 17.3*  --   --   --   --   --   --   INR 1.4*  --   --   --   --   --   --   CREATININE 1.81*  --  1.67*  --   --  2.37*  --    < > = values in this interval not displayed.     Estimated Creatinine Clearance: 24.2 mL/min (A) (by C-G formula based on SCr of 2.37 mg/dL (H)).   Assessment: Patient is a 71 y.o F with hx non-Hodgkin's lymphoma and anemia who presented to the ED on 03/26/2022 with c/o abdominal pain, n/v and diarrhea.  She was found to be septic with perforated diverticulitis.  She underwent  sigmoidectomy, bladder repair and open abdominal vac placement on 03/26/2022.  She returned to the OR on 03/17/2022 for exp lap with ostomy and debridement closure/abdominal wound. Heparin SQ started on 03/30/22 for VTE prophylaxis.  Platelets were 179K on 04/04/2022 with levels trending each day since admission.  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  - 9/27: 1 unit PRBC - 9/30: 2 units PRBC ordered  Today, 04/04/2022: - aPTT is therapeutic at 57 secs - plts down 56K - hgb down 7.3 (Aranesp 300  mcg x1 given on 9/28) - transfused - no bleeding document - HIT antb collected on 9/29 - in process   Goal of Therapy:  aPTT 50-90 seconds Monitor platelets by anticoagulation protocol: Yes   Plan:  - continue argatroban at 0.5 mcg/kg/min - check aPTT q12h at 5a and 5p while patient is in ICU - monitor plts closely and for s/sx bleeding - f/u with HIT antb   Peggyann Juba, PharmD, BCPS Pharmacy: [phone] 04/04/2022,6:26 PM

## 2022-04-04 NOTE — Progress Notes (Signed)
eLink Physician-Brief Progress Note Patient Name: Helen Hardy DOB: August 02, 1950 MRN: [id number]   Date of Service  04/04/2022  HPI/Events of Note  Pt given one time dose of midazolam earlier for agitation and vent dyssynchrony with good response  eICU Interventions  PRN midazolam ordered for additional sedation while on vent support.         [place] 04/04/2022, 9:23 PM

## 2022-04-04 NOTE — Progress Notes (Signed)
3 Days Post-Op   Subjective/Chief Complaint: On vent No acute change overnight    Objective: Vital signs in last 24 hours: Temp:  [97.5 F (36.4 C)-100.2 F (37.9 C)] 100.2 F (37.9 C) (09/30 1000) Pulse Rate:  [63-109] 97 (09/30 1000) Resp:  [11-22] 16 (09/30 1000) BP: (85-151)/(58-99) 151/99 (09/30 1000) SpO2:  [92 %-98 %] 95 % (09/30 1000) FiO2 (%):  [30 %] 30 % (09/30 0803) Weight:  [113.7 kg] 113.7 kg (09/30 0500) Last BM Date : 03/18/2022  Intake/Output from previous day: 09/29 0701 - 09/30 0700 In: 2696.2 [I.V.:1697.8; NG/GT:747; IV Piggyback:251.4] Out: 1090 [Urine:1050; Drains:40] Intake/Output this shift: Total I/O In: 976.1 [I.V.:830.3; NG/GT:140.3; IV Piggyback:5.4] Out: 350 [Urine:350]  Exam: On vent Moving extremities but not following commands Abdomen soft, ostomy starting to produce Wound VAC in place to midline wound  Lab Results:  Recent Labs    04/03/22 0317 04/04/22 0512  WBC 8.5 10.1  HGB 8.2* 7.3*  HCT 24.7* 22.3*  PLT 61* 56*   BMET Recent Labs    04/03/22 0317 04/04/22 0512  NA 143 144  K 4.2 3.8  CL 117* 115*  CO2 20* 22  GLUCOSE 180* 166*  BUN 51* 69*  CREATININE 1.67* 2.37*  CALCIUM 7.4* 7.6*   PT/INR Recent Labs    04/02/22 0419  LABPROT 17.3*  INR 1.4*   ABG No results for input(s): "PHART", "HCO3" in the last 72 hours.  Invalid input(s): "PCO2", "PO2"  Studies/Results: [place] EKG SITE RITE  Result Date: 04/03/2022 If Site Rite image not attached, placement could not be confirmed due to current cardiac rhythm.  DG Chest Port 1 View  Result Date: 04/03/2022 CLINICAL DATA:  [address] intubated, pleural effusions. History of perforated sigmoid diverticulitis. EXAM: PORTABLE CHEST 1 VIEW COMPARISON:  March 30, 2022 FINDINGS: Evaluation is limited by patient rotation. The cardiomediastinal silhouette is grossly unchanged in contour.ETT tip terminates 4.9 cm above the carina. The enteric tube courses through the chest to  the abdomen beyond the field-of-view. RIGHT IJ CVC tip terminates over the superior cavoatrial junction. Small layering bilateral pleural effusions. No significant pneumothorax. Bibasilar opacities. Gaseous distension of the stomach. IMPRESSION: 1. Support apparatus as described above. 2. Small layering bilateral pleural effusions with bibasilar opacities, likely atelectasis. Electronically Signed   By: Valentino Saxon M.D.   On: 04/03/2022 07:45    Anti-infectives: Anti-infectives (From admission, onward)    Start     Dose/Rate Route Frequency Ordered Stop   03/30/22 1000  micafungin (MYCAMINE) 100 mg in sodium chloride 0.9 % 100 mL IVPB  Status:  Discontinued        100 mg 105 mL/hr over 1 Hours Intravenous Every 24 hours 03/30/22 0849 04/03/22 1424   03/30/22 0000  piperacillin-tazobactam (ZOSYN) IVPB 3.375 g        3.375 g 12.5 mL/hr over 240 Minutes Intravenous Every 8 hours 03/26/2022 2141     03/21/2022 1800  ceFEPIme (MAXIPIME) 2 g in sodium chloride 0.9 % 100 mL IVPB        2 g 200 mL/hr over 30 Minutes Intravenous  Once 03/14/2022 1746 03/21/2022 1930   03/25/2022 1800  metroNIDAZOLE (FLAGYL) IVPB 500 mg        500 mg 100 mL/hr over 60 Minutes Intravenous  Once 03/20/2022 1746 03/26/2022 1930   04/04/2022 1800  vancomycin (VANCOCIN) IVPB 1000 mg/200 mL premix        1,000 mg 200 mL/hr over 60 Minutes Intravenous  Once 03/31/2022 1746 03/17/2022  2315       Assessment/Plan: Perforated sigmoid colon with diffuse feculent peritonitis and septic shock -POD#6 s/p Exploratory laparotomy with sigmoid colectomy; repair of bladder injury, possible fistula; placement of negative pressure dressing 9/24 Dr. Donne Hazel -POD#3 s/p EXPLORATORY LAPAROTOMY; OSTOMY; [place] 9/27 Dr. Marcello Moores  Continuing TNA May try to resume trickle feeds in next 24 hours Continue FOLEY given bladder repair Hgb down.  Transfuse per primary team WBC normal Plts decreasing Appreciate CCM's care of  the patient  Coralie Keens MD 04/04/2022

## 2022-04-05 ENCOUNTER — Inpatient Hospital Stay (HOSPITAL_COMMUNITY): Payer: Medicare Other

## 2022-04-05 DIAGNOSIS — A419 Sepsis, unspecified organism: Secondary | ICD-10-CM | POA: Diagnosis not present

## 2022-04-05 DIAGNOSIS — K668 Other specified disorders of peritoneum: Secondary | ICD-10-CM | POA: Diagnosis not present

## 2022-04-05 DIAGNOSIS — J9601 Acute respiratory failure with hypoxia: Secondary | ICD-10-CM | POA: Diagnosis not present

## 2022-04-05 LAB — CBC
HCT: 30.6 % — ABNORMAL LOW (ref 36.0–46.0)
Hemoglobin: 10.3 g/dL — ABNORMAL LOW (ref 12.0–15.0)
MCH: 32.6 pg (ref 26.0–34.0)
MCHC: 33.7 g/dL (ref 30.0–36.0)
MCV: 96.8 fL (ref 80.0–100.0)
Platelets: 54 10*3/uL — ABNORMAL LOW (ref 150–400)
RBC: 3.16 MIL/uL — ABNORMAL LOW (ref 3.87–5.11)
RDW: 19 % — ABNORMAL HIGH (ref 11.5–15.5)
WBC: 11.4 10*3/uL — ABNORMAL HIGH (ref 4.0–10.5)
nRBC: 1.8 % — ABNORMAL HIGH (ref 0.0–0.2)

## 2022-04-05 LAB — GLUCOSE, CAPILLARY
Glucose-Capillary: 156 mg/dL — ABNORMAL HIGH (ref 70–99)
Glucose-Capillary: 164 mg/dL — ABNORMAL HIGH (ref 70–99)
Glucose-Capillary: 178 mg/dL — ABNORMAL HIGH (ref 70–99)
Glucose-Capillary: 188 mg/dL — ABNORMAL HIGH (ref 70–99)
Glucose-Capillary: 229 mg/dL — ABNORMAL HIGH (ref 70–99)
Glucose-Capillary: 235 mg/dL — ABNORMAL HIGH (ref 70–99)

## 2022-04-05 LAB — RENAL FUNCTION PANEL
Albumin: 2.1 g/dL — ABNORMAL LOW (ref 3.5–5.0)
Anion gap: 12 (ref 5–15)
BUN: 90 mg/dL — ABNORMAL HIGH (ref 8–23)
CO2: 19 mmol/L — ABNORMAL LOW (ref 22–32)
Calcium: 7.8 mg/dL — ABNORMAL LOW (ref 8.9–10.3)
Chloride: 112 mmol/L — ABNORMAL HIGH (ref 98–111)
Creatinine, Ser: 3.16 mg/dL — ABNORMAL HIGH (ref 0.44–1.00)
GFR, Estimated: 15 mL/min — ABNORMAL LOW (ref >60)
Glucose, Bld: 179 mg/dL — ABNORMAL HIGH (ref 70–99)
Phosphorus: 5.2 mg/dL — ABNORMAL HIGH (ref 2.5–4.6)
Potassium: 4 mmol/L (ref 3.5–5.1)
Sodium: 143 mmol/L (ref 135–145)

## 2022-04-05 LAB — URINALYSIS, ROUTINE W REFLEX MICROSCOPIC
Bilirubin Urine: NEGATIVE
Glucose, UA: NEGATIVE mg/dL
Ketones, ur: NEGATIVE mg/dL
Nitrite: NEGATIVE
Protein, ur: 100 mg/dL — AB
RBC / HPF: >50 RBC/hpf — ABNORMAL HIGH (ref 0–5)
Specific Gravity, Urine: 1.01 (ref 1.005–1.030)
pH: 5 (ref 5.0–8.0)

## 2022-04-05 LAB — APTT: aPTT: 58 seconds — ABNORMAL HIGH (ref 24–36)

## 2022-04-05 LAB — TRIGLYCERIDES: Triglycerides: 159 mg/dL — ABNORMAL HIGH (ref <150)

## 2022-04-05 LAB — MAGNESIUM: Magnesium: 2.4 mg/dL (ref 1.7–2.4)

## 2022-04-05 MED ORDER — TRAVASOL 10 % IV SOLN
INTRAVENOUS | Status: DC
  Filled 2022-04-05: qty 1142.4

## 2022-04-05 MED ORDER — PIPERACILLIN-TAZOBACTAM IN DEX 2-0.25 GM/50ML IV SOLN
2.2500 g | Freq: Three times a day (TID) | INTRAVENOUS | Status: DC
  Administered 2022-04-05 – 2022-04-06 (×4): 2.25 g via INTRAVENOUS
  Filled 2022-04-05 (×6): qty 50

## 2022-04-05 NOTE — Progress Notes (Signed)
Chaplain called to provide support for family. Met granddaughter and two close friends of patient bedside. Share in conversation with them, no particular needs expressed. Let them know chaplain staff id available and will provide continued support if and when needed.

## 2022-04-05 NOTE — Progress Notes (Signed)
Pt intubated and sedated. Pt family members, daughters, Helen Hardy and Helen Hardy, became loudly verbally aggressive towards one another at bedside. Attempted to provide emotional support and family asked to calm down. Daughters not cooperative. Security called to bedside, and escorted both daughters off of premises. Daughters notified by Agricultural consultant that they are not allowed back on unit until at least tomorrow. Husband, Helen Hardy, at bedside tearful. Provided emotional support to husband, and called chaplain.

## 2022-04-05 NOTE — Progress Notes (Signed)
PHARMACY - TOTAL PARENTERAL NUTRITION CONSULT NOTE   Indication: Prolonged ileus  Patient Measurements: Height: 4' 11"  (149.9 cm) Weight: 113.7 kg (250 lb 10.6 oz) IBW/kg (Calculated) : 43.2   Body mass index is 50.63 kg/m. Usual Weight:   Assessment: Patient is a 71 y.o F with hx non-Hodgkin's lymphoma and anemia who presented to the ED on 03/27/2022 with c/o abdominal pain, n/v and diarrhea.  She was found to be septic with perforated diverticulitis.  She underwent  sigmoidectomy, bladder repair and open abdominal vac placement on 04/03/2022.  She returned to the OR on 03/21/2022 for exp lap with ostomy and debridement closure/abdominal wound. Pharmacy has been consulted on 9/29 to start TPN for prolonged ileus.  Glucose / Insulin: on rSSI q4h (used 16 units since TPN increased to 55 ml/hr at 6p yesterday) - cbgs (goal <150): 156-179 - on Solu-cortef 50 mg q8h Electrolytes:  - Na, K, and CorrCa, CO2 wnl - Mag at upper end of range 4, phos increased to 5.2, CL high 112, CO2 low 19 -  goal mag > 2, K > 4, phos ~3 for ileus Renal: scr up 2.37 (crcl~24), BUN elevated 69 Hepatic: LFTs wnl - TG: 172 (9/30), 159 (10/1) Intake / Output; MIVF:  - NaCL @ KVO - I/O: +1075 mL - drain: 150 mL - NG/GT: 473 mL GI Imaging: - 9/24 abd CT angio: sigmoid diverticular abscess. Perforated sigmoid diverticulitis, with moderate amount of free intraperitoneal air. Bilateral lower lobe infiltrates. GI Surgeries / Procedures:  - 9/24: ex lap, Sigmoidectomy, bladder repair, open abdominal vac placement - 9/27: exp lap with ostomy and debridement closure/abdominal wound  Central access: CVC TPN start date: 04/03/22  Nutritional Goals: Goal TPN rate is 70 mL/hr (provides 114 g of protein and 1323 kcals per day)  RD Assessment: Estimated Needs Total Energy Estimated Needs: 1200-1450 Total Protein Estimated Needs: >111g Total Fluid Estimated Needs: 1.6L/day  Current Nutrition:  - TF started on 9/28 >> 9/29  AM - NPO - Starting TPN on 9/29  Plan:   At 1800: - Increase TPN to goal rate 70 mL/hr at 1800 - Electrolytes in TPN:  Na 42mq/L K 559m/L Ca 89m26mL Mg 2 mEq/L No Phos  Cl:Ac 1:2 - Add standard MVI and trace elements to TPN - continue Resistant q4h SSI and adjust as needed  - MIVF @ KVO per MD - Monitor TPN labs on Mon/Thurs,  - Renal panel ordered by MD thru 10/1  Helen Hardy P 04/05/2022,7:49 AM

## 2022-04-05 NOTE — Progress Notes (Signed)
NAMEAmanda Hardy, MRN:  [id number], DOB:  Nov 29, 1950, LOS: 7 ADMISSION DATE:  03/26/2022, CONSULTATION DATE:  9/24 REFERRING MD:  Donne Hazel, CHIEF COMPLAINT:  abdominal pain   History of Present Illness:  71 y/o female with extensive past medical history including rheumatoid arthritis on multiple immunosuppressants admitted with septic shock from perforated diverticulitis with bladder fistula requiring emergent ex-lap for sigmoid colectomy and bladder repair on 9/24.  She returned form the operating room early AM 9/25 with septic shock and remained on mechanical ventilatory support.    Pertinent  Medical History  Rheumatoid arthritis GERD Anemia of chronic disease Hyeprtension Non-hodgkin's lymphoma Prior ischemic colitis  Significant Hospital Events: Including procedures, antibiotic start and stop dates in addition to other pertinent events   9/24 admission for septic shock from perforated diverticulitis, emergent ex-lap with Dr. Donne Hazel for sigmoidectomy and bladder repair, brought to the ICU Blood culture 03/08/2022>> BCID staph species. Aureus not detected >>> Urine culture 03/19/2022>>  9/24 cefepime x 1 9/24 vanc  x 1 9/24 flagyl x 1 9/24 zosyn >> 9/25 micafungin >  9/27 back to OR for ex lap, ostomy, debridement/ closure of abd wound 9/28 off NE 9/30 appears to be weaning  Interim History / Subjective:   Dyssynchronous with the ventilator Vomiting this morning Not arousable  Objective   Blood pressure (!) 130/91, pulse 97, temperature 99.3 F (37.4 C), temperature source Axillary, resp. rate 15, height 4' 11"  (1.499 m), weight 113.7 kg, SpO2 98 %.    Vent Mode: PRVC FiO2 (%):  [30 %-40 %] 40 % Set Rate:  [20 bmp] 20 bmp Vt Set:  [340 mL] 340 mL PEEP:  [5 cmH20] 5 cmH20 Pressure Support:  [5 cmH20] 5 cmH20   Intake/Output Summary (Last 24 hours) at 04/05/2022 0740 Last data filed at 04/05/2022 0400 Gross per 24 hour  Intake 2816.38 ml  Output 1075 ml   Net 1741.38 ml   Filed Weights   04/03/22 1040 04/04/22 0500 04/05/22 0500  Weight: 113.6 kg 113.7 kg 113.7 kg   Examination: General: Obese, elderly, chronically ill-appearing  HEENT: Moist oral mucosa, endotracheal tube in place Neuro: Not arousable, unable to follow commands CV: S1-S2 appreciated  PULM: Bilateral rhonchi GI: Wound VAC in place extremities: warm/dry, no pitting edema  Skin: no rashes   I's/O- still significantly positive  BUN of 90, creatinine of 3.16 Platelet count of 54  Resolved Hospital Problem list     Assessment & Plan:   Acute respiratory failure with hypoxemia due to septic shock -Continue full ventilator support, does not appear to be able to wean at present -Still with significant secretions -Continue to monitor closely  Septic shock secondary to perforated diverticulitis Immunocompromise state at 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 at baseline -On midodrine, on hydrocortisone -Completed micafungin -On Zosyn  S/p exploratory laparotomy with sigmoid colectomy and bladder repair In OR 9/27 for ostomy creation and abdominal closure -Wound VAC in place -Foley in place -Start tube feeding because of vomiting  Chronic pain Narcotic dependence -Was attempting to wean sedation as tolerated -Mental status is remained poor -On Oxy IR and gabapentin for pain  Heart failure with reduced ejection fraction -Left ventricular ejection fraction of 35 to 45%  Chronic kidney disease -We will continue to trend electrolytes -Avoid nephrotoxic medications -Maintain renal perfusion -Renal parameters unfortunately worsening  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 -On argatroban  Hyperglycemia -Continue SSI  History of rheumatoid arthritis -Immunomodulators on hold  Multiorgan dysfunction -Prognosis is  poor   Best Practice (right click and "Reselect all SmartList Selections" daily)   Diet/type: NPO; holding TF  today-vomiting DVT prophylaxis: other> argatroban, HIT panel pending GI prophylaxis: PPI Lines: Central line and yes and it is still needed> R femoral aline pending removal Foley:  Yes, and it is still needed> do not remove per surgery Code Status:  full code Last date of multidisciplinary goals of care discussion [per primary, family updated 9/25]  Husband updated at bedside 9/29.  Labs   CBC: Recent Labs  Lab 03/11/2022 1435 03/23/2022 1819 03/15/2022 2047 03/15/2022 0341 03/16/2022 2014 04/02/22 1234 04/03/22 0317 04/04/22 0512 04/04/22 2021 04/05/22 0512  WBC 4.3 3.1*   < > 12.3*  --  11.6* 8.5 10.1  --  11.4*  NEUTROABS 1.3* 1.1*  --   --   --   --  7.2  --   --   --   HGB 12.4 11.9*   < > 8.1*   < > 8.5* 8.2* 7.3* 10.3* 10.3*  HCT 40.0 38.8   < > 24.9*   < > 25.6* 24.7* 22.3* 31.2* 30.6*  MCV 112.0* 112.8*   < > 107.8*  --  98.1 99.2 100.9*  --  96.8  PLT 196 179   < > 106*  --  78* 61* 56*  --  54*   < > = values in this interval not displayed.    Basic Metabolic Panel: Recent Labs  Lab 03/12/2022 0341 04/02/22 0419 04/03/22 0317 04/04/22 0512 04/05/22 0512  NA 143 144 143 144 143  K 3.8 3.7 4.2 3.8 4.0  CL 113* 115* 117* 115* 112*  CO2 23 21* 20* 22 19*  GLUCOSE 155* 152* 180* 166* 179*  BUN 35* 43* 51* 69* 90*  CREATININE 1.79* 1.81* 1.67* 2.37* 3.16*  CALCIUM 7.1* 7.0* 7.4* 7.6* 7.8*  MG 1.8 2.4 2.4 2.3  --   PHOS 3.8 3.6 3.1 4.5 5.2*   GFR: Estimated Creatinine Clearance: 18.1 mL/min (A) (by C-G formula based on SCr of 3.16 mg/dL (H)). Recent Labs  Lab 03/19/2022 1820 03/18/2022 2345 03/30/22 0416 03/30/22 0500 03/31/22 0036 04/02/22 1234 04/03/22 0317 04/04/22 0512 04/05/22 0512  WBC  --   --    < >  --    < > 11.6* 8.5 10.1 11.4*  LATICACIDVEN 3.8* 3.9*  --  3.4*  --   --   --   --   --    < > = values in this interval not displayed.    Liver Function Tests: Recent Labs  Lab 03/09/2022 1435 03/30/22 0416 03/31/22 0036 03/17/2022 0341 04/02/22 0419  04/03/22 0317 04/04/22 0512 04/05/22 0512  AST 25 39 56* 59* 39  --   --   --   ALT 17 23 32 45* 39  --   --   --   ALKPHOS 72 52 49 63 59  --   --   --   BILITOT 0.7 0.7 1.3* 1.0 0.6  --   --   --   PROT 7.5 4.8* 5.6* 5.0* 4.5*  --   --   --   ALBUMIN 3.6 2.2* 3.0* 2.3* 2.0* 2.0* 2.0* 2.1*   Recent Labs  Lab 03/18/2022 1435  LIPASE 22   No results for input(s): "AMMONIA" in the last 168 hours.  ABG    Component Value Date/Time   PHART 7.4 03/16/2022 0408   PCO2ART 35 03/23/2022 0408   PO2ART 91 03/07/2022 0408  HCO3 21.7 03/27/2022 0408   TCO2 16 (L) 03/23/2022 2134   ACIDBASEDEF 2.7 (H) 03/25/2022 0408   O2SAT 97.4 03/31/2022 0408     Coagulation Profile: Recent Labs  Lab 04/04/2022 1819 04/02/22 0419  INR 1.3* 1.4*    Cardiac Enzymes: No results for input(s): "CKTOTAL", "CKMB", "CKMBINDEX", "TROPONINI" in the last 168 hours.  HbA1C: Hgb A1c MFr Bld  Date/Time Value Ref Range Status  03/30/2022 09:30 AM 5.5 4.8 - 5.6 % Final    Comment:    (NOTE) Pre diabetes:          5.7%-6.4%  Diabetes:              >6.4%  Glycemic control for   <7.0% adults with diabetes   12/26/2019 05:30 AM 5.7 (H) 4.8 - 5.6 % Final    Comment:    (NOTE)         Prediabetes: 5.7 - 6.4         Diabetes: >6.4         Glycemic control for adults with diabetes: <7.0     CBG: Recent Labs  Lab 04/04/22 1642 04/04/22 2000 04/05/22 0026 04/05/22 0326 04/05/22 0720  GLUCAP 168* 175* 164* 178* 156*   The patient is critically ill with multiple organ systems failure and requires high complexity decision making for assessment and support, frequent evaluation and titration of therapies, application of advanced monitoring technologies and extensive interpretation of multiple databases. Critical Care Time devoted to patient care services described in this note independent of APP/resident time (if applicable)  is 33 minutes.   Sherrilyn Rist MD [facility] Personal pager: See Amion If unanswered, please page CCM On-call: [phone]

## 2022-04-05 NOTE — Progress Notes (Signed)
Pharmacy Antibiotic Note  Helen Hardy is a 71 y.o. female  with hx RA on chronic steroid, non-Hodgkin's lymphoma and anemia who presented to the ED on 03/14/2022 with c/o abdominal pain, n/v and diarrhea.  She was found to be septic with perforated diverticulitis.  She underwent  sigmoidectomy, bladder repair and open abdominal vac placement on 03/08/2022.  She returned to the OR on 03/28/2022 for exp lap with ostomy and debridement closure/abdominal wound. She's currently on zosyn for broad coverage.  Today, 04/05/2022: - day # 7 of abx - Tmax 100.9, WBC 11.4 (on solu-cortef) - scr trending scr up 2.37 (crcl~18), UOP 0.3 ml/kg/hr  Plan: - adjust zosyn to 2.25 gm IV q8h  - monitor renal function closely  _______________________________________  Height: 4' 11"  (149.9 cm) Weight: 113.7 kg (250 lb 10.6 oz) IBW/kg (Calculated) : 43.2  Temp (24hrs), Avg:100.2 F (37.9 C), Min:98.6 F (37 C), Max:100.9 F (38.3 C)  Recent Labs  Lab 04/02/2022 1820 04/02/2022 2345 03/30/22 0416 03/30/22 0500 03/30/22 1100 03/22/2022 0341 04/02/22 0419 04/02/22 1234 04/03/22 0317 04/04/22 0512 04/05/22 0512  WBC  --   --    < >  --    < > 12.3*  --  11.6* 8.5 10.1 11.4*  CREATININE  --   --    < >  --    < > 1.79* 1.81*  --  1.67* 2.37* 3.16*  LATICACIDVEN 3.8* 3.9*  --  3.4*  --   --   --   --   --   --   --    < > = values in this interval not displayed.     Estimated Creatinine Clearance: 18.1 mL/min (A) (by C-G formula based on SCr of 3.16 mg/dL (H)).    Allergies  Allergen Reactions   Heparin Other (See Comments)    R/o HIT (HIT antb ordered on 04/03/22)   Roxicodone [Oxycodone] Nausea And Vomiting    Can tolerate with antiemetics    Ultram [Tramadol] Nausea And Vomiting   9/24 cefepime x1 9/24 zosyn>> 9/25 Micafungin>>9/29   9/25 MRSA pcr (+) 9/25 ucx: ngf 9/24 bcx x 2: 1 bottle with GPC in clusters (BCID= staph species) FINAL --> suspects contaminant   Thank you for allowing pharmacy  to be a part of this patient's care.  Lynelle Doctor 04/05/2022 7:37 AM

## 2022-04-05 NOTE — Progress Notes (Signed)
4 Days Post-Op   Subjective/Chief Complaint: Had emesis again on trickle tube feeds Ostomy productive Remains on vent   Objective: Vital signs in last 24 hours: Temp:  [98.6 F (37 C)-100.9 F (38.3 C)] 99.3 F (37.4 C) (10/01 0400) Pulse Rate:  [76-99] 97 (10/01 0600) Resp:  [11-29] 15 (10/01 0600) BP: (95-173)/(59-117) 130/91 (10/01 0500) SpO2:  [92 %-100 %] 98 % (10/01 0600) FiO2 (%):  [30 %-40 %] 40 % (10/01 0748) Weight:  [113.7 kg] 113.7 kg (10/01 0500) Last BM Date : 03/11/2022  Intake/Output from previous day: 09/30 0701 - 10/01 0700 In: 2816.4 [I.V.:1624.8; Blood:666.5; NG/GT:473.7; IV Piggyback:51.5] Out: 1075 [Urine:825; Drains:150; Stool:100] Intake/Output this shift: Total I/O In: -  Out: 200 [Stool:200]  Exam: Intubated and sedated Abdomen obese, soft, non-distended, ostomy viable, VAC in place, Liquid stool in the bag Foley in place with clear urine  Lab Results:  Recent Labs    04/04/22 0512 04/04/22 2021 04/05/22 0512  WBC 10.1  --  11.4*  HGB 7.3* 10.3* 10.3*  HCT 22.3* 31.2* 30.6*  PLT 56*  --  54*   BMET Recent Labs    04/04/22 0512 04/05/22 0512  NA 144 143  K 3.8 4.0  CL 115* 112*  CO2 22 19*  GLUCOSE 166* 179*  BUN 69* 90*  CREATININE 2.37* 3.16*  CALCIUM 7.6* 7.8*   PT/INR No results for input(s): "LABPROT", "INR" in the last 72 hours. ABG No results for input(s): "PHART", "HCO3" in the last 72 hours.  Invalid input(s): "PCO2", "PO2"  Studies/Results: [place] EKG SITE RITE  Result Date: 04/03/2022 If Site Rite image not attached, placement could not be confirmed due to current cardiac rhythm.   Anti-infectives: Anti-infectives (From admission, onward)    Start     Dose/Rate Route Frequency Ordered Stop   04/05/22 0800  piperacillin-tazobactam (ZOSYN) IVPB 2.25 g        2.25 g 100 mL/hr over 30 Minutes Intravenous Every 8 hours 04/05/22 0710     03/30/22 1000  micafungin (MYCAMINE) 100 mg in sodium chloride 0.9 % 100 mL  IVPB  Status:  Discontinued        100 mg 105 mL/hr over 1 Hours Intravenous Every 24 hours 03/30/22 0849 04/03/22 1424   03/30/22 0000  piperacillin-tazobactam (ZOSYN) IVPB 3.375 g  Status:  Discontinued        3.375 g 12.5 mL/hr over 240 Minutes Intravenous Every 8 hours 03/14/2022 2141 04/05/22 0710   03/13/2022 1800  ceFEPIme (MAXIPIME) 2 g in sodium chloride 0.9 % 100 mL IVPB        2 g 200 mL/hr over 30 Minutes Intravenous  Once 03/15/2022 1746 03/22/2022 1930   03/10/2022 1800  metroNIDAZOLE (FLAGYL) IVPB 500 mg        500 mg 100 mL/hr over 60 Minutes Intravenous  Once 03/17/2022 1746 03/08/2022 1930   04/04/2022 1800  vancomycin (VANCOCIN) IVPB 1000 mg/200 mL premix        1,000 mg 200 mL/hr over 60 Minutes Intravenous  Once 03/21/2022 1746 03/10/2022 2315       Assessment/Plan: Perforated sigmoid colon with diffuse feculent peritonitis and septic shock -POD#7 s/p Exploratory laparotomy with sigmoid colectomy; repair of bladder injury, possible fistula; placement of negative pressure dressing 9/24 Dr. Donne Hazel -POD#4 s/p EXPLORATORY LAPAROTOMY; OSTOMY; DEBRIDEMENT CLOSURE/ABDOMINAL WOUND 9/27 Dr. Marcello Moores  Remains of the vent Post op ileus Hold tube feeds again Continuing TNA, Wound Vac Creatinine increasing Continue IV antibiotics and follow WBC and platelets  Coralie Keens MD 04/05/2022

## 2022-04-05 NOTE — Progress Notes (Signed)
Spoke with family at bedside regarding ongoing issues with Helen Hardy -[name]  2 daughters and spouse at bedside  I did let them know that she is overall just not doing very well May require CRRT in the short-term with creatinine increasing and general kidney dysfunction -This may mean she would require dialysis long-term which if she did not have a good quality of life to start off with, she may not be a good candidate for long-term dialysis.  Unable to come off the ventilator at present  Requiring sedation because of ventilator dyssynchrony  Overall not showing any signs of improvement yet

## 2022-04-05 NOTE — Progress Notes (Signed)
Overall, her platelet count is holding steady.  Is 54,000 this morning.  Her white count 11.4.  Hemoglobin 10.3.  I biggest concern is that her renal function is declining quickly.  Her BUN is 90 creatinine 3.16.  Her GFR is down to 15 cc/min.  I suspect this is all from this abdominal infection that she had.  I think she was also on pressors which could have decreased flow to her kidneys.  I am not sure that she would be a candidate for dialysis if need be.  I am sure that her family probably would want that.  She is off heparin.  She is not bleeding.  As such, we just continue to follow.  If there is evidence of infection, this could certainly affect your thrombocytopenia and may make it difficult for her platelet count to come back up.  Lattie Haw, MD Lurena Joiner 1:37

## 2022-04-05 NOTE — Progress Notes (Signed)
[place] for Argatroban Indication: r/o HIT  Allergies  Allergen Reactions   Heparin Other (See Comments)    R/o HIT (HIT antb ordered on 04/03/22)   Roxicodone [Oxycodone] Nausea And Vomiting    Can tolerate with antiemetics    Ultram [Tramadol] Nausea And Vomiting    Patient Measurements: Height: 4' 11"  (149.9 cm) Weight: 113.7 kg (250 lb 10.6 oz) IBW/kg (Calculated) : 43.2  Vital Signs: Temp: 99.3 F (37.4 C) (10/01 0400) Temp Source: Axillary (10/01 0400) BP: 130/91 (10/01 0500) Pulse Rate: 97 (10/01 0600)  Labs: Recent Labs    04/03/22 0317 04/03/22 1427 04/04/22 0512 04/04/22 1733 04/04/22 2021 04/05/22 0512  HGB 8.2*  --  7.3*  --  10.3* 10.3*  HCT 24.7*  --  22.3*  --  31.2* 30.6*  PLT 61*  --  56*  --   --  54*  APTT  --    < > 64* 57*  --  58*  CREATININE 1.67*  --  2.37*  --   --  3.16*   < > = values in this interval not displayed.     Estimated Creatinine Clearance: 18.1 mL/min (A) (by C-G formula based on SCr of 3.16 mg/dL (H)).   Assessment: Patient is a 71 y.o F with hx non-Hodgkin's lymphoma and anemia who presented to the ED on 03/13/2022 with c/o abdominal pain, n/v and diarrhea.  She was found to be septic with perforated diverticulitis.  She underwent  sigmoidectomy, bladder repair and open abdominal vac placement on 03/31/2022.  She returned to the OR on 03/16/2022 for exp lap with ostomy and debridement closure/abdominal wound. Heparin SQ started on 03/30/22 for VTE prophylaxis.  Platelets were 179K on 03/25/2022 with levels trending each day since admission.  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  - 9/27: 1 unit PRBC - 9/30: 2 units PRBC ordered  Today, 04/05/2022: - aPTT is therapeutic at 58 secs - plts trending down 54K - hgb stable at 10.3  (Aranesp 300 mcg x1 given on 9/28) - no bleeding document - HIT antb collected on 9/29 = 0.046 (range: 0 - 0.4) --> neg  for HIT   Goal of Therapy:  aPTT 50-90 seconds Monitor platelets by anticoagulation protocol: Yes   Plan:  - Spoke to Dr. Marin Olp, he said we can stop argatroban since HIT antb is negative  Sudais Banghart P 04/05/2022,7:41 AM

## 2022-04-05 DEATH — deceased

## 2022-04-06 ENCOUNTER — Inpatient Hospital Stay (HOSPITAL_COMMUNITY): Payer: Medicare Other

## 2022-04-06 DIAGNOSIS — Z515 Encounter for palliative care: Secondary | ICD-10-CM

## 2022-04-06 DIAGNOSIS — K668 Other specified disorders of peritoneum: Secondary | ICD-10-CM | POA: Diagnosis not present

## 2022-04-06 DIAGNOSIS — N171 Acute kidney failure with acute cortical necrosis: Secondary | ICD-10-CM

## 2022-04-06 DIAGNOSIS — K5720 Diverticulitis of large intestine with perforation and abscess without bleeding: Secondary | ICD-10-CM

## 2022-04-06 DIAGNOSIS — R6521 Severe sepsis with septic shock: Secondary | ICD-10-CM

## 2022-04-06 DIAGNOSIS — K5732 Diverticulitis of large intestine without perforation or abscess without bleeding: Secondary | ICD-10-CM

## 2022-04-06 DIAGNOSIS — Z7189 Other specified counseling: Secondary | ICD-10-CM

## 2022-04-06 DIAGNOSIS — K659 Peritonitis, unspecified: Secondary | ICD-10-CM

## 2022-04-06 LAB — GLUCOSE, CAPILLARY
Glucose-Capillary: 175 mg/dL — ABNORMAL HIGH (ref 70–99)
Glucose-Capillary: 195 mg/dL — ABNORMAL HIGH (ref 70–99)
Glucose-Capillary: 205 mg/dL — ABNORMAL HIGH (ref 70–99)
Glucose-Capillary: 228 mg/dL — ABNORMAL HIGH (ref 70–99)

## 2022-04-06 LAB — CBC
HCT: 31.2 % — ABNORMAL LOW (ref 36.0–46.0)
Hemoglobin: 10.1 g/dL — ABNORMAL LOW (ref 12.0–15.0)
MCH: 32.2 pg (ref 26.0–34.0)
MCHC: 32.4 g/dL (ref 30.0–36.0)
MCV: 99.4 fL (ref 80.0–100.0)
Platelets: 54 10*3/uL — ABNORMAL LOW (ref 150–400)
RBC: 3.14 MIL/uL — ABNORMAL LOW (ref 3.87–5.11)
RDW: 19 % — ABNORMAL HIGH (ref 11.5–15.5)
WBC: 10.2 10*3/uL (ref 4.0–10.5)
nRBC: 2.1 % — ABNORMAL HIGH (ref 0.0–0.2)

## 2022-04-06 LAB — TYPE AND SCREEN
ABO/RH(D): O POS
Antibody Screen: NEGATIVE
Unit division: 0
Unit division: 0

## 2022-04-06 LAB — BPAM RBC
Blood Product Expiration Date: 202311032359
Blood Product Expiration Date: 202311032359
ISSUE DATE / TIME: 202309301213
ISSUE DATE / TIME: 202309301456
Unit Type and Rh: 5100
Unit Type and Rh: 5100

## 2022-04-06 LAB — COMPREHENSIVE METABOLIC PANEL
ALT: 27 U/L (ref 0–44)
AST: 16 U/L (ref 15–41)
Albumin: 2.1 g/dL — ABNORMAL LOW (ref 3.5–5.0)
Alkaline Phosphatase: 55 U/L (ref 38–126)
Anion gap: 11 (ref 5–15)
BUN: 113 mg/dL — ABNORMAL HIGH (ref 8–23)
CO2: 19 mmol/L — ABNORMAL LOW (ref 22–32)
Calcium: 7.9 mg/dL — ABNORMAL LOW (ref 8.9–10.3)
Chloride: 111 mmol/L (ref 98–111)
Creatinine, Ser: 3.69 mg/dL — ABNORMAL HIGH (ref 0.44–1.00)
GFR, Estimated: 12 mL/min — ABNORMAL LOW (ref >60)
Glucose, Bld: 227 mg/dL — ABNORMAL HIGH (ref 70–99)
Potassium: 4.6 mmol/L (ref 3.5–5.1)
Sodium: 141 mmol/L (ref 135–145)
Total Bilirubin: 0.8 mg/dL (ref 0.3–1.2)
Total Protein: 4.9 g/dL — ABNORMAL LOW (ref 6.5–8.1)

## 2022-04-06 LAB — TRIGLYCERIDES: Triglycerides: 172 mg/dL — ABNORMAL HIGH (ref <150)

## 2022-04-06 LAB — PHOSPHORUS: Phosphorus: 5.1 mg/dL — ABNORMAL HIGH (ref 2.5–4.6)

## 2022-04-06 LAB — MAGNESIUM: Magnesium: 2.2 mg/dL (ref 1.7–2.4)

## 2022-04-06 MED ORDER — FUROSEMIDE 10 MG/ML IJ SOLN: 40.0000 mg | Freq: Two times a day (BID) | INTRAMUSCULAR | Status: DC

## 2022-04-06 MED ORDER — HYDROCORTISONE SOD SUC (PF) 100 MG IJ SOLR: 50.0000 mg | Freq: Every day | INTRAMUSCULAR | Status: DC

## 2022-04-06 MED ORDER — TRAVASOL 10 % IV SOLN
INTRAVENOUS | Status: DC
  Filled 2022-04-06: qty 1142.4

## 2022-04-06 MED ORDER — SODIUM CHLORIDE 0.9 % IV SOLN: 2.0000 g | INTRAVENOUS | Status: DC

## 2022-04-06 MED ORDER — ALBUMIN HUMAN 25 % IV SOLN: 50.0000 g | Freq: Four times a day (QID) | INTRAVENOUS | Status: DC

## 2022-04-06 MED ORDER — METRONIDAZOLE 500 MG/100ML IV SOLN: 500.0000 mg | Freq: Two times a day (BID) | INTRAVENOUS | Status: DC

## 2022-04-06 MED ORDER — PROPOFOL 1000 MG/100ML IV EMUL
5.0000 ug/kg/min | INTRAVENOUS | Status: DC
  Administered 2022-04-06: 5 ug/kg/min via INTRAVENOUS
  Filled 2022-04-06: qty 100

## 2022-04-06 MED ORDER — ALBUMIN HUMAN 5 % IV SOLN
25.0000 g | Freq: Four times a day (QID) | INTRAVENOUS | Status: DC
  Administered 2022-04-06: 25 g via INTRAVENOUS
  Filled 2022-04-06: qty 500

## 2022-04-06 NOTE — Progress Notes (Signed)
PHARMACY - TOTAL PARENTERAL NUTRITION CONSULT NOTE   Indication: Prolonged ileus  Patient Measurements: Height: 4' 11"  (149.9 cm) Weight: 113.9 kg (251 lb 1.7 oz) IBW/kg (Calculated) : 43.2   Body mass index is 50.72 kg/m. Usual Weight:   Assessment: Patient is a 71 y.o F with hx non-Hodgkin's lymphoma and anemia who presented to the ED on 03/28/2022 with c/o abdominal pain, n/v and diarrhea.  She was found to be septic with perforated diverticulitis.  She underwent  sigmoidectomy, bladder repair and open abdominal vac placement on 03/18/2022.  She returned to the OR on 03/11/2022 for exp lap with ostomy and debridement closure/abdominal wound. Pharmacy has been consulted on 9/29 to start TPN for prolonged ileus.  Glucose / Insulin: cbgs (goal <150): 156-235 - Used 30 units rSSI q4h (TPN increased to 70 ml/hr at 6p yesterday) - remains on Solu-cortef 50 mg q8h (PTA prednisone) - 03/30/22 A1c 5.5 Electrolytes: Na, K, CorrCa, Mag, Cl WNL. - Phos remains elevated 5.1, CO2 low 19 -  goal mag > 2, K > 4, phos ~3 for ileus Renal: scr increased further to 3.69, BUN elevated 113 Hepatic: LFTs wnl - TG: 172 (9/30), 159 (10/1), 172 (10/2) Intake / Output; MIVF: no IVF, Net I/O: +4282 mL - Emesis x1, stool 325 mL, UOP 425 mL, drains 217m  GI Imaging: - 9/24 abd CT angio: sigmoid diverticular abscess. Perforated sigmoid diverticulitis, with moderate amount of free intraperitoneal air. Bilateral lower lobe infiltrates. GI Surgeries / Procedures:  - 9/24: ex lap, Sigmoidectomy, bladder repair, open abdominal vac placement - 9/27: exp lap with ostomy and debridement closure/abdominal wound  Central access: CVC TPN start date: 04/03/22  Nutritional Goals: Goal TPN rate is 70 mL/hr (provides 114 g of protein and 1323 kcals per day)  RD Assessment: Estimated Needs Total Energy Estimated Needs: 1200-1450 Total Protein Estimated Needs: >111g Total Fluid Estimated Needs: 1.6L/day  Current Nutrition:   - TF started on 9/28 >> 9/29 AM (emesis with trickle TF) - NPO - Starting TPN on 9/29  Plan: At 1800: - Continue TPN at goal rate 70 mL/hr  - Electrolytes in TPN:  Na 522m/L K 2071mL Ca 5mE7m Mg 1 mEq/L No Phos  Cl:Ac max Ac - Add standard MVI and trace elements to TPN - continue Resistant q4h SSI and adjust as needed  - Add 15 units regular insulin to TPN  - MIVF @ KVO per MD - Monitor TPN labs on Mon/Thurs; BMET, Mag, Phos daily x2   ChriGretta ArabrmD, BCPS Clinical Pharmacist [facility] main pharmacy [phone]/2023 9:38 AM

## 2022-04-06 NOTE — Progress Notes (Signed)
[facility] Progress Note  5 Days Post-Op  Subjective: CC-  Remains on the vent. Attempted to start trickle tube feedings again Saturday night and patient vomited yesterday. NG back to suction and has had about 500cc out, in addition to emesis. Ostomy with 325cc documented output. WBC 10.2, TMAX 100.8 Creatinine up 3.69  Objective: Vital signs in last 24 hours: Temp:  [97.3 F (36.3 C)-98.6 F (37 C)] 98.6 F (37 C) (10/02 0819) Pulse Rate:  [64-95] 94 (10/02 0800) Resp:  [12-30] 14 (10/02 0900) BP: (94-145)/(57-112) 117/72 (10/02 0900) SpO2:  [88 %-100 %] 97 % (10/02 0809) FiO2 (%):  [40 %] 40 % (10/02 0819) Weight:  [113.9 kg] 113.9 kg (10/02 0500) Last BM Date : 04/06/22  Intake/Output from previous day: 10/01 0701 - 10/02 0700 In: 5632.4 [I.V.:4768.8; NG/GT:713.7; IV Piggyback:150] Out: 1350 [Urine:425; Emesis/NG output:400; Drains:200; Stool:325] Intake/Output this shift: Total I/O In: 661 [I.V.:611; IV Piggyback:50] Out: 625 [Urine:200; Emesis/NG output:350; Stool:75]  Vent settings: Vent Mode: PSV;CPAP FiO2 (%):  [40 %] 40 % Set Rate:  [20 bmp] 20 bmp Vt Set:  [340 mL] 340 mL PEEP:  [5 cmH20] 5 cmH20 Pressure Support:  [5 cmH20] 5 cmH20 Plateau Pressure:  [20 cmH20-25 cmH20] 23 cmH20   PE: Gen:  on the vent Card:  RRR Pulm:  mechanically ventilated Abd: Soft, stoma pictured below dark but functioning with some thin bilious effluent in bag, Midline wound deep and pale pink/ fascia appears intact/ serosanguinous drainage GU: foley    Lab Results:  Recent Labs    04/05/22 0512 04/06/22 0500  WBC 11.4* 10.2  HGB 10.3* 10.1*  HCT 30.6* 31.2*  PLT 54* 54*   BMET Recent Labs    04/05/22 0512 04/06/22 0500  NA 143 141  K 4.0 4.6  CL 112* 111  CO2 19* 19*  GLUCOSE 179* 227*  BUN 90* 113*  CREATININE 3.16* 3.69*  CALCIUM 7.8* 7.9*   PT/INR No results for input(s): "LABPROT", "INR" in the last 72 hours. CMP     Component Value  Date/Time   NA 141 04/06/2022 0500   NA 144 04/29/2017 0929   K 4.6 04/06/2022 0500   K 4.0 04/29/2017 0929   CL 111 04/06/2022 0500   CL 103 06/21/2015 0800   CO2 19 (L) 04/06/2022 0500   CO2 25 04/29/2017 0929   GLUCOSE 227 (H) 04/06/2022 0500   GLUCOSE 119 04/29/2017 0929   GLUCOSE 108 06/21/2015 0800   BUN 113 (H) 04/06/2022 0500   BUN 17.7 04/29/2017 0929   CREATININE 3.69 (H) 04/06/2022 0500   CREATININE 1.34 (H) 03/13/2022 0947   CREATININE 0.8 04/29/2017 0929   CALCIUM 7.9 (L) 04/06/2022 0500   CALCIUM 9.3 04/29/2017 0929   PROT 4.9 (L) 04/06/2022 0500   PROT 6.3 (L) 04/29/2017 0929   ALBUMIN 2.1 (L) 04/06/2022 0500   ALBUMIN 3.4 (L) 04/29/2017 0929   AST 16 04/06/2022 0500   AST 21 03/13/2022 0947   AST 19 04/29/2017 0929   ALT 27 04/06/2022 0500   ALT 15 03/13/2022 0947   ALT 16 04/29/2017 0929   ALKPHOS 55 04/06/2022 0500   ALKPHOS 55 04/29/2017 0929   BILITOT 0.8 04/06/2022 0500   BILITOT 0.5 03/13/2022 0947   BILITOT 0.43 04/29/2017 0929   GFRNONAA 12 (L) 04/06/2022 0500   GFRNONAA 42 (L) 03/13/2022 0947   GFRAA >60 04/05/2020 0602   GFRAA 46 (L) 03/19/2020 1015   Lipase     Component Value  Date/Time   LIPASE 22 03/10/2022 1435       Studies/Results: DG CHEST PORT 1 VIEW  Result Date: 04/06/2022 CLINICAL DATA:  Ventilator dependence. EXAM: PORTABLE CHEST 1 VIEW COMPARISON:  04/05/2022 FINDINGS: 0558 hours. Low volume film. Bibasilar collapse/consolidation with bilateral pleural effusions, left greater than right. The cardio pericardial silhouette is enlarged. Endotracheal tube tip is 5.0 cm above the base of the carina. The NG tube passes into the stomach although the distal tip position is not included on the film. Right IJ central line overlies the region of the right atrium. Telemetry leads overlie the chest. IMPRESSION: Low volume film with bibasilar collapse/consolidation and bilateral pleural effusions, left greater than right. No substantial  change. Electronically Signed   By: Misty Stanley M.D.   On: 04/06/2022 06:40   DG CHEST PORT 1 VIEW  Result Date: 04/05/2022 CLINICAL DATA:  Evaluate position of endotracheal tube, difficulty breathing EXAM: PORTABLE CHEST 1 VIEW COMPARISON:  Previous studies including the examination done on 04/03/2022 FINDINGS: Tip of endotracheal tube is 6 cm above the carina. Enteric tube is noted traversing the esophagus. Transverse diameter of heart is increased. Shift of mediastinum to the right has not changed. There is poor inspiration. There is haziness in the left mid and left lower lung fields. There is improvement in aeration of right lower lung fields suggesting decrease in pleural effusion and infiltrate. There is blunting of left lateral CP angle. There is no pneumothorax. IMPRESSION: Increased density in left lower lung field may suggest pleural effusion and underlying atelectasis/pneumonia. There is improvement in the aeration in right lower lung field. Tip of endotracheal tube is 6 cm above the carina, higher than usual in position. Electronically Signed   By: Elmer Picker M.D.   On: 04/05/2022 12:53   DG Abd 1 View  Result Date: 04/05/2022 CLINICAL DATA:  Evaluate NG tube placement EXAM: ABDOMEN - 1 VIEW COMPARISON:  Previous studies including the examination done earlier today FINDINGS: Tip of enteric tube is seen in the region of fundus/body of the stomach. No significant interval changes are noted in position of enteric tube. Bowel gas pattern is nonspecific. Lower abdomen and pelvis are not included in the image. Increased density is seen in both lower lung fields suggesting pleural effusions and possibly infiltrates. IMPRESSION: Tip of enteric tube is seen in the stomach. Electronically Signed   By: Elmer Picker M.D.   On: 04/05/2022 12:40   DG Abd 1 View  Result Date: 04/05/2022 CLINICAL DATA:  Evaluate NG tube placement EXAM: ABDOMEN - 1 VIEW COMPARISON:  03/22/2022 FINDINGS:  Enteric tube tip and side port are in satisfactory position within the proximal stomach below the level of the GE junction. No dilated bowel loops identified. IMPRESSION: Enteric tube tip and side port are below the GE junction. Electronically Signed   By: Kerby Moors M.D.   On: 04/05/2022 09:22    Anti-infectives: Anti-infectives (From admission, onward)    Start     Dose/Rate Route Frequency Ordered Stop   04/05/22 0800  piperacillin-tazobactam (ZOSYN) IVPB 2.25 g        2.25 g 100 mL/hr over 30 Minutes Intravenous Every 8 hours 04/05/22 0710     03/30/22 1000  micafungin (MYCAMINE) 100 mg in sodium chloride 0.9 % 100 mL IVPB  Status:  Discontinued        100 mg 105 mL/hr over 1 Hours Intravenous Every 24 hours 03/30/22 0849 04/03/22 1424   03/30/22 0000  piperacillin-tazobactam (ZOSYN)  IVPB 3.375 g  Status:  Discontinued        3.375 g 12.5 mL/hr over 240 Minutes Intravenous Every 8 hours 03/20/2022 2141 04/05/22 0710   03/28/2022 1800  ceFEPIme (MAXIPIME) 2 g in sodium chloride 0.9 % 100 mL IVPB        2 g 200 mL/hr over 30 Minutes Intravenous  Once 03/21/2022 1746 03/09/2022 1930   03/31/2022 1800  metroNIDAZOLE (FLAGYL) IVPB 500 mg        500 mg 100 mL/hr over 60 Minutes Intravenous  Once 03/19/2022 1746 04/03/2022 1930   03/09/2022 1800  vancomycin (VANCOCIN) IVPB 1000 mg/200 mL premix        1,000 mg 200 mL/hr over 60 Minutes Intravenous  Once 04/02/2022 1746 03/15/2022 2315        Assessment/Plan Perforated sigmoid colon with diffuse feculent peritonitis and septic shock -POD#8 s/p Exploratory laparotomy with sigmoid colectomy; repair of bladder injury, possible fistula; placement of negative pressure dressing 9/24 Dr. Donne Hazel -POD#5 s/p EXPLORATORY LAPAROTOMY; OSTOMY; Erma 9/27 Dr. Marcello Moores - WOC following for new ostomy and abdominal wound vac, vac change MWF - continue antibiotics - leukocytosis resolved but TMAX 100.8 - did not tolerate trickle TF over  the weekend. Persistent ileus, continue OG to LIWS and nutrition via TPN. 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

## 2022-04-06 NOTE — Consult Note (Addendum)
[place] Nurse wound follow up Wound type: surgical  Measurement:20cm x 4cm x 5.5cm  Wound FVW:AQLRJ, pink, subcutaneous tissue; oozing serous fluid  Drainage (amount, consistency, odor) serosanguinous; cannister almost full Periwound: intact; ostomy left lateral aspect of wound edge  Dressing procedure/placement/frequency: Removed old NPWT dressing (3 pc of black foam) Filled wound with  _1__ piece of black foam Sealed NPWT dressing at 142m HG Patient received IV/pain medication per bedside nurse prior to replacement of dressing Patient tolerated procedure well; needs pre-medication   WOC nurse or surgery PA will continue to provide NPWT dressing changed due to the complexity of the dressing change.   [place] ost[place] Stoma type/location: LUQ, end colostomy  Stomal assessment/size: 1 1/4" slightly budded; pink, moist Peristomal assessment: intact Treatment options for stomal/peristomal skin: 2" skin barrier ring Output liquid green Ostomy pouching: 1pc. Flex convex with 2" skin barrier ring Education provided: NA; patient sedated; family opted to leave the room. Explained that we would not be teaching ostomy care until patient could participate [facility] Yes, husband signed paperwork  [place]

## 2022-04-06 NOTE — Progress Notes (Signed)
                                                                                                                                                                                          Patient Name: Helen Hardy       Date: 04/06/2022 DOB: 1951/03/22  Age: 71 y.o. MRN#: [id number] Attending Physician: Freddi Starr, MD Primary Care Physician: Deland Pretty, MD Admit Date: 03/08/2022  This provider plans to place full consult note as soon as able.  Wanted to document a medical change in care of this time.  Discussed patient's care with her decision-maker, husband, and one of her daughters.  After extensive discussions, family has elected to transition to comfort care at this time.  Their priority is that patient does not suffer with pain or agitation.  Patient will remain on the ventilator at this time to allow family to come to bedside to say their goodbyes.  Planning for palliative extubation once family has visited which will likely be till tomorrow.  We will discontinue any interventions not aimed at comfort care at this time.  Patient's CODE STATUS will be updated to DNR/DNI.  Family voiced hearing that patient could pass away while on the ventilator before all family has had time to say goodbye and they are understanding of this.  Again focus of care at this time is comfort measures.  Patient will likely pass away in the hospital after palliative extubation performed.  Informed primary ICU team, bedside RN, and chaplain regarding plane.  Again we will place full consult note today as well.  Chelsea Aus, DO Palliative Care Provider [phone] Please contact Palliative Medicine Team phone at [phone] for questions and concerns.

## 2022-04-06 NOTE — Consult Note (Signed)
Consultation Note Date: 04/06/2022   Patient Name: Helen Hardy  DOB: March 10, 1951  MRN: [id number]  Age / Sex: 71 y.o., female   PCP: Deland Pretty, MD Referring Physician: Freddi Starr, MD  Reason for Consultation: Establishing goals of care     Chief Complaint/History of Present Illness:  Patient is a 71 year old female with an extensive past medical history which includes rheumatoid arthritis on multiple immunosuppressants, GERD, anemia of chronic disease, hypertension, non-Hodgkin's lymphoma, and prior ischemic colitis who was admitted on 9/24 for management of septic shock secondary to perforated diverticulitis with bladder fistula requiring emergent ex lap for sigmoid colectomy and bladder repair on 9/24. During hospitalization patient has undergone further surgical interventions, required multiple antibiotics, required pressor support at times, has had difficult time tolerating tube feeds, has had worsening renal function, has been receiving ventilator support via ET tube, and mentation has not been improving.   Extensive time spent by this provider reviewing EMR.  Informed by bedside RN patient family present in the AM.  Presented to discuss care with patient's family though husband had stepped out.  Informed later in the day when husband was back at bedside.  Presented to bedside and able to speak to patient's husband/decision maker and daughter outside of patient's room in private setting.  Introduced myself and the role of the palliative care team.  Patient and her husband have been married for 30 years and he is the Scientist, research (medical).  Patient's daughter also present for discussion as she assist in patient's care and husband wanted her present to assist in conversations.  Spent extensive time getting to hear the patient's prolonged medical history and intense hospitalization from husband and daughter.  Also spent time inquiring who patient was before she came into the  hospital.  Family able to offer that patient was essentially bedbound prior to even coming to the hospital.  They noted patient needed assistance with most ADLs and IADLs.  Inquired what brought the patient joy and they noted that she enjoyed talking with family being as independent as she could.  They described patient lovingly as stubborn and set in her ways and everyone understood this.  They noted patient found quality of life spending time with family though overall patient had complained about her poor quality of life. They described how patient has had intense pain in the setting of rheumatoid arthritis which was diagnosed when she was in her 85s.  They described how patient would plead to God to take her and help her because the pain would be so bad.  They described that patient was dependent on pain medications which did not even control her pain that well and led to the patient being in tears due to uncontrolled pain.   They also described that patient had been "preparing them" regarding her wishes for medical care.  They noted that patient had a voice stated she never wanted to be kept alive on machines or in a poor condition and so they agreed with her jokingly.  We discussed that sometimes our loved ones know that their bodies are failing them and so are trying to get ready to transition their family for the difficult things that are not moving forward.  Family agreed with this.   They also noted that patient would not want to live in a SNF setting.  Patient's comfort was at home with her husband who provided 24/7 care to her.  They shared that patient would not be okay being in  the facility where family was not present 24/7.  Acknowledged this as well as again this compiled into what brings someone a good quality of life.  Spent time reviewing patient's medical illnesses in her current state.  With permission, able to discuss possible medical pathways moving forward including continuing with the  current aggressive medical management patient has been receiving.  Also noted could transition pathway of care to focusing on patient's comfort at the end of life for the time she does have remaining.  Based on what the family was stating about patient's wishes for medical care and her overall poor prognosis due to her multisystem organ failure, would be very appropriate to focus on patient's comfort.  Family agreed with this.  We discussed how her patient's body is tired and this is a natural process leading to death.  Family acknowledged this as well.  Then we discussed plan for care moving forward with transition to comfort care.  The priority of care from the patient's family is that the patient not suffer with pain or agitation.  They noted patient has suffered enough during her life and they do not want to see her uncomfortable anymore.  Noted we could focus on symptom management.  We discussed that they will reach out to other family members who need to see a goodbye at this time to asked them to come in today if able.  We discussed that we would discontinue interventions that are not focused on patient's comfort such as lab work, imaging, or any escalation of interventional medicine.  Did note we will continue ventilator at this time to allow patient's family members time to come to bedside and say goodbye though would plan for palliative extubation, likely tomorrow, as having the ET tube in itself is uncomfortable and do not want to cause the patient pain.  The only medications that will be adjusted are ones to focus on patient's comfort.  Family agreeing with this plan and very appreciative of this.  We discussed that with transitioning to comfort focus, patient can die on a ventilator should her heart stop.  Noted we would not perform resuscitation and would allow her to have a natural death.  Discussed that this can occur before all family members have had a chance to say goodbye and they voiced  understanding and agree with wanting patient to be comfortable and to be allowed to have a natural death even if it is on a ventilator.  Code status now DNR/DNI. Now planning for palliative extubation should patient survive to tomorrow.  Emotional support provided during visit.  Daughter and husband appropriately tearful.  They also stressed great appreciation for all the care the patient has received and the wonderful nursing staff in the ICU.  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  They do not want patient to be transferred to any other facility.  Acknowledged this.  Noted would reach out to chaplain to assist with spiritual support as well during this difficult time which family is appreciative of.  All questions answered at that time.  Noted palliative care team would continue to follow along.  Allowed space for family to reach out to other family members to say goodbye.  Updated bedside RN and ICU team regarding transition to comfort care at this time.  Primary Diagnoses  Present on Admission:  Free  intraperitoneal air   Palliative Review of Systems: Unable to obtain from patient's secondary to medical status.  We will note that when visualizing patient she appears agitated and uncomfortable.  I have reviewed the medical record, interviewed the patient and family, and examined the patient. The following aspects are pertinent.  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/2015   Non Hodgkin's lymphoma ([facility])    Numbness and tingling of right leg    Pneumonia    x2   RA (rheumatoid arthritis) ([place])    Social History   Socioeconomic  History   Marital status: Married    Spouse name: Not on file   Number of children: Not on file   Years of education: Not on file   Highest education level: Not on file  Occupational History   Not on file  Tobacco Use   Smoking status: Never   Smokeless tobacco: Never  Vaping Use   Vaping Use: Never used  Substance and Sexual Activity   Alcohol use: No    Alcohol/week: 0.0 standard drinks of alcohol   Drug use: No   Sexual activity: Not on file  Other Topics Concern   Not on file  Social History Narrative   Ambulates with cane or walker.  Lives with husband.   Social Determinants of Health   Financial Resource Strain: Not on file  Food Insecurity: No Food Insecurity (03/30/2022)   Hunger Vital Sign    Worried About Running Out of Food in the Last Year: Never true    Ran Out of Food in the Last Year: Never true  Transportation Needs: No Transportation Needs (03/30/2022)   PRAPARE - Hydrologist (Medical): No    Lack of Transportation (Non-Medical): No  Physical Activity: Not on file  Stress: Not on file  Social Connections: Not on file   Family History  Problem Relation Age of Onset   Breast cancer Neg Hx    Scheduled Meds:  artificial tears   Both Eyes Q8H   Chlorhexidine Gluconate Cloth  6 each Topical Daily   docusate  100 mg Per Tube BID   feeding supplement (VITAL HIGH PROTEIN)  1,000 mL Per Tube Q24H   furosemide  40 mg Intravenous BID   gabapentin  300 mg Per Tube Q8H   [START ON 2022-04-23] hydrocortisone sod succinate (SOLU-CORTEF) inj  50 mg Intravenous Daily   influenza vaccine adjuvanted  0.5 mL Intramuscular Tomorrow-1000   insulin aspart  0-20 Units Subcutaneous Q4H   midodrine  5 mg Per Tube TID WC   mouth rinse  15 mL Mouth Rinse Q2H   oxyCODONE  5 mg Per Tube Q6H   pantoprazole (PROTONIX) IV  40 mg Intravenous Q24H   pneumococcal 20-valent conjugate vaccine  0.5 mL Intramuscular Tomorrow-1000   polyethylene glycol  17 g  Per Tube Daily   sodium chloride flush  10-40 mL Intracatheter Q12H   sodium chloride flush  3 mL Intravenous Q12H   Continuous Infusions:  sodium chloride     sodium chloride     sodium chloride     albumin human     cefTRIAXone (ROCEPHIN)  IV     dexmedetomidine (PRECEDEX) IV infusion 1.2  mcg/kg/hr (04/06/22 1105)   fentaNYL infusion INTRAVENOUS 400 mcg/hr (04/06/22 0917)   methocarbamol (ROBAXIN) IV     metronidazole     propofol (DIPRIVAN) infusion     TPN ADULT (ION) 70 mL/hr at 04/06/22 0900   TPN ADULT (ION)     PRN Meds:.Place/Maintain arterial line **AND** sodium chloride, sodium chloride, acetaminophen (TYLENOL) oral liquid 160 mg/5 mL, fentaNYL, fentaNYL (SUBLIMAZE) injection, methocarbamol **OR** methocarbamol (ROBAXIN) IV, midazolam, ondansetron **OR** ondansetron (ZOFRAN) IV, mouth rinse, sodium chloride flush, sodium chloride flush  Allergies  Allergen Reactions   Roxicodone [Oxycodone] Nausea And Vomiting    Can tolerate with antiemetics    Ultram [Tramadol] Nausea And Vomiting   CBC:    Component Value Date/Time   WBC 10.2 04/06/2022 0500   HGB 10.1 (L) 04/06/2022 0500   HGB 10.5 (L) 03/13/2022 0947   HGB 10.9 (L) 04/29/2017 0929   HGB 10.6 (L) 06/06/2007 1012   HCT 31.2 (L) 04/06/2022 0500   HCT 32.3 (L) 04/29/2017 0929   HCT 30.3 (L) 06/06/2007 1012   PLT 54 (L) 04/06/2022 0500   PLT 162 03/13/2022 0947   PLT 157 04/29/2017 0929   PLT 163 06/06/2007 1012   MCV 99.4 04/06/2022 0500   MCV 110 (H) 04/29/2017 0929   MCV 107.0 (H) 06/06/2007 1012   NEUTROABS 7.2 04/03/2022 0317   NEUTROABS 3.3 04/29/2017 0929   NEUTROABS 3.3 06/06/2007 1012   LYMPHSABS 0.6 (L) 04/03/2022 0317   LYMPHSABS 1.7 04/29/2017 0929   LYMPHSABS 1.2 06/06/2007 1012   MONOABS 0.4 04/03/2022 0317   MONOABS 0.3 06/06/2007 1012   EOSABS 0.0 04/03/2022 0317   EOSABS 0.1 04/29/2017 0929   BASOSABS 0.1 04/03/2022 0317   BASOSABS 0.0 04/29/2017 0929   BASOSABS 0.0 06/06/2007 1012    Comprehensive Metabolic Panel:    Component Value Date/Time   NA 141 04/06/2022 0500   NA 144 04/29/2017 0929   K 4.6 04/06/2022 0500   K 4.0 04/29/2017 0929   CL 111 04/06/2022 0500   CL 103 06/21/2015 0800   CO2 19 (L) 04/06/2022 0500   CO2 25 04/29/2017 0929   BUN 113 (H) 04/06/2022 0500   BUN 17.7 04/29/2017 0929   CREATININE 3.69 (H) 04/06/2022 0500   CREATININE 1.34 (H) 03/13/2022 0947   CREATININE 0.8 04/29/2017 0929   GLUCOSE 227 (H) 04/06/2022 0500   GLUCOSE 119 04/29/2017 0929   GLUCOSE 108 06/21/2015 0800   CALCIUM 7.9 (L) 04/06/2022 0500   CALCIUM 9.3 04/29/2017 0929   AST 16 04/06/2022 0500   AST 21 03/13/2022 0947   AST 19 04/29/2017 0929   ALT 27 04/06/2022 0500   ALT 15 03/13/2022 0947   ALT 16 04/29/2017 0929   ALKPHOS 55 04/06/2022 0500   ALKPHOS 55 04/29/2017 0929   BILITOT 0.8 04/06/2022 0500   BILITOT 0.5 03/13/2022 0947   BILITOT 0.43 04/29/2017 0929   PROT 4.9 (L) 04/06/2022 0500   PROT 6.3 (L) 04/29/2017 0929   ALBUMIN 2.1 (L) 04/06/2022 0500   ALBUMIN 3.4 (L) 04/29/2017 0929    Physical Exam: Vital Signs: BP 104/66   Pulse 80   Temp 98.6 F (37 C) (Axillary)   Resp 12   Ht 4' 11"  (1.499 m)   Wt 113.9 kg   SpO2 99%   BMI 50.72 kg/m  SpO2: SpO2: 99 % O2 Device: O2 Device: Ventilator O2 Flow Rate: O2 Flow Rate (L/min): 10 L/min Intake/output summary:  Intake/Output Summary (Last 24 hours) at 04/06/2022 1414  Last data filed at 04/06/2022 1126 Gross per 24 hour  Intake 4018.34 ml  Output 1275 ml  Net 2743.34 ml   LBM: Last BM Date : 04/06/22 Baseline Weight: Weight: 103 kg Most recent weight: Weight: 113.9 kg  General: Intubated, appears uncomfortable while pulling against ET tube laying in bed, chronically ill-appearing Eyes: conjunctiva clear, anicteric sclera HENT: ET tube in place on ventilator support Cardiovascular: RRR Respiratory: On ventilator support via ET tube Abdomen: Distended, reviewed wound care imaging of  recent surgical sites Extremities: edema in LE b/l Skin: Reviewed wound care imaging of recent surgical sites on abdomen Neuro: Not following commands, on sedation and still appears agitated Psych: Unable to assess affect  Palliative Performance Scale: 10%             Additional Data Reviewed: Recent Labs    04/05/22 0512 04/06/22 0500  WBC 11.4* 10.2  HGB 10.3* 10.1*  PLT 54* 54*  NA 143 141  BUN 90* 113*  CREATININE 3.16* 3.69*    Imaging: Personally reviewed recent imaging.  Palliative Care Assessment and Plan Summary of Established Goals of Care and Medical Treatment Preferences    # Complex medical decision making/goals of care  -Patient unable to participate in complex medical decision making due to her underlying medical status.  -Spoke with patient's husband/medical decision maker and 1 daughter.  Please see PI for further details regarding extensive conversation.  Outcome of conversation is that patient would not want to be kept alive on machines and in the suffering condition or to be sent to a nursing facility for long-term care.  Family agreeing to transition to comfort care focus at this time.  Family's priority is that patient not have pain and anxiety as she has "suffered so much during her life".   -Family reaching out to other family members today so they can come and say goodbye.  Planning for palliative extubation once this has occurred, likely tomorrow.  -Discontinue interventions that are no longer focused on patient's comfort.  We will keep ventilator in place until patient's family has had time to say goodbye.  -Discussed that patient can die on ventilator support and will not perform resuscitation, will allow her to have a natural death.  Family agreeing with this.  CODE STATUS updated from full code to DNR/DNI.  # Symptom management  -Discussed with primary ICU team who is going to appropriately adjust her symptom management medications to focus on pain  and agitation.  # Psycho-social/Spiritual Support:  -Neutral support is important to patient and family.  Personally informed chaplain of request for visit.  # Discharge Planning: Transitioning to comfort care focused at this time.  Will remain on ventilator until family has had time to say goodbye.  Patient will likely pass away in the hospital.  CRITICAL CARE Performed by: Terrilee Files  Total critical care time: 102 minutes Patient unable to participate in medical decision making due to critical illness.  Critical care time personally spent was 102 minutes coordinating care with other providers, reviewing EMR including lab work and imaging, discussing pathways for care moving forward with family, and ultimately deciding to focus on comfort care with de-escalation of current care level.  CODE STATUS updated to DNR/DNI.  Signed by: Chelsea Aus, DO Palliative Care Provider [phone]  Please contact Palliative Medicine Team phone at [phone] for questions and concerns.

## 2022-04-06 NOTE — Progress Notes (Signed)
Inpatient Diabetes Program Recommendations  AACE/ADA: New Consensus Statement on Inpatient Glycemic Control (2015)  Target Ranges:  Prepandial:   less than 140 mg/dL      Peak postprandial:   less than 180 mg/dL (1-2 hours)      Critically ill patients:  140 - 180 mg/dL   Lab Results  Component Value Date   GLUCAP 205 (H) 04/06/2022   HGBA1C 5.5 03/30/2022    Review of Glycemic Control  Latest Reference Range & Units 04/06/22 05:01 04/06/22 08:00  Glucose-Capillary 70 - 99 mg/dL 228 (H) 205 (H)   Current orders for Inpatient glycemic control:  Vital 20 ml/hr Novolog 0-20 units q 4 hours Solucortef 50 mg IV daily TPN at 70 ml/hr  Inpatient Diabetes Program Recommendations:   Note insulin being added to TPN.  If blood sugars continue to be>180 mg/dL, consider adding Novolog 3 units q 4 hours.    Thanks,  Adah Perl, RN, BC-ADM Inpatient Diabetes Coordinator Pager [phone] (8a-5p)

## 2022-04-06 NOTE — Progress Notes (Signed)
Pharmacy Antibiotic Note  Helen Hardy is a 71 y.o. female  with hx RA on chronic steroid, non-Hodgkin's lymphoma and anemia who presented to the ED on 03/26/2022 with c/o abdominal pain, n/v and diarrhea.  She was found to be septic with perforated diverticulitis.  She underwent  sigmoidectomy, bladder repair and open abdominal vac placement on 03/09/2022.  She returned to the OR on 03/10/2022 for exp lap with ostomy and debridement closure/abdominal wound. Pharmacy is consulted to change from Zosyn (renally adjusted) to ceftriaxone and metronidazole.    Today, 04/06/2022: - day # 8 of abx - Tmax 100.8, WBC 10.2 (solu-cortef) - scr trending scr up 3.69 (crcl~15), UOP 0.2 ml/kg/hr  Plan: - Ceftriaxone 2g IV q24h - Metronidazole 517m IV q12h - Follow up renal function, culture results, and clinical course.   _______________________________________  Height: 4' 11"  (149.9 cm) Weight: 113.9 kg (251 lb 1.7 oz) IBW/kg (Calculated) : 43.2  Temp (24hrs), Avg:98.2 F (36.8 C), Min:97.3 F (36.3 C), Max:98.6 F (37 C)  Recent Labs  Lab 04/02/22 0419 04/02/22 1234 04/03/22 0317 04/04/22 0512 04/05/22 0512 04/06/22 0500  WBC  --  11.6* 8.5 10.1 11.4* 10.2  CREATININE 1.81*  --  1.67* 2.37* 3.16* 3.69*     Estimated Creatinine Clearance: 15.6 mL/min (A) (by C-G formula based on SCr of 3.69 mg/dL (H)).    Allergies  Allergen Reactions   Roxicodone [Oxycodone] Nausea And Vomiting    Can tolerate with antiemetics    Ultram [Tramadol] Nausea And Vomiting   Antimicrobials this admission:  9/24 cefepime x1 9/24 zosyn>> 10/2 9/25 Micafungin>>9/29 10/2 Ceftriaxone >>  10/2 Metronidazole >>   Microbiology results:  9/25 MRSA pcr (+) 9/25 ucx: ngf 9/24 bcx x 2: 1 bottle with GPC in clusters (BCID= staph species) FINAL --> suspects contaminant    Thank you for allowing pharmacy to be a part of this patient's care.  CGretta ArabPharmD, BCPS Clinical Pharmacist [facility] main pharmacy  [phone]/08/2021 11:41 AM

## 2022-04-06 NOTE — Progress Notes (Signed)
NAMEDenina Rieger, MRN:  [id number], DOB:  07-Jun-1951, LOS: 8 ADMISSION DATE:  03/17/2022, CONSULTATION DATE:  9/24 REFERRING MD:  Helen Hardy, CHIEF COMPLAINT:  abdominal pain   History of Present Illness:  71 y/o female with extensive past medical history including rheumatoid arthritis on multiple immunosuppressants admitted with septic shock from perforated diverticulitis with bladder fistula requiring emergent ex-lap for sigmoid colectomy and bladder repair on 9/24.  She returned form the operating room early AM 9/25 with septic shock and remained on mechanical ventilatory support.    Pertinent  Medical History  Rheumatoid arthritis GERD Anemia of chronic disease Hyeprtension Non-hodgkin's lymphoma Prior ischemic colitis  Significant Hospital Events: Including procedures, antibiotic start and stop dates in addition to other pertinent events   9/24 admission for septic shock from perforated diverticulitis, emergent ex-lap with Dr. Donne Hardy for sigmoidectomy and bladder repair, brought to the ICU  9/24 cefepime x 1 9/24 vanc  x 1 9/24 flagyl x 1 9/24 zosyn >> 9/25 micafungin > complete 9/27 back to OR for ex lap, ostomy, debridement/ closure of abd wound 9/28 off NE 9/30 appears to be weaning 10/2 SBT good, renal function worsening. Mental status remains poor.   Interim History / Subjective:  Emesis last night while on trickles. TF held.  Tolerating SBT well.  WUA limited by pain and need to wound vac change today.  Renal function worsening  Objective   Blood pressure 117/72, pulse 94, temperature 98.6 F (37 C), temperature source Axillary, resp. rate 14, height 4' 11"  (1.499 m), weight 113.9 kg, SpO2 97 %.    Vent Mode: PSV;CPAP FiO2 (%):  [40 %] 40 % Set Rate:  [20 bmp] 20 bmp Vt Set:  [340 mL] 340 mL PEEP:  [5 cmH20] 5 cmH20 Pressure Support:  [5 cmH20] 5 cmH20 Plateau Pressure:  [20 cmH20-25 cmH20] 23 cmH20   Intake/Output Summary (Last 24 hours) at  04/06/2022 1007 Last data filed at 04/06/2022 0900 Gross per 24 hour  Intake 4344.98 ml  Output 1425 ml  Net 2919.98 ml    Filed Weights   04/04/22 0500 04/05/22 0500 04/06/22 0500  Weight: 113.7 kg 113.7 kg 113.9 kg   Examination: General: obese elderly female in NAD HEENT: /AT, PERRL, no JVD Neuro: sedated RASS -3 CV: RRR, no MRG PULM: Bilateral rhonchi GI: Wound vac, observed during dressing change. Tissue looks good. extremities: Warm. Dry. No edema.   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  - Foley in place - TF on hold due to vomiting. Likely ileus ongoing. - TPN - CCS may want to scan her abdomen today.  Chronic pain Narcotic dependence -Attempting to wean sedation as tolerated -Mental status is remained poor -On Oxy IR and gabapentin for pain  Chronic heart failure with reduced  ejection fraction-LVEF 35 to 45% -Telemetry -Hypervolemic, will try to diuresis with albumin assist -May need RRT depending on goals of care.   Acute renal failure: worsening Chronic kidney disease -Continue to trend electrolytes -Avoid nephrotoxic medications -Maintain renal perfusion -Renal parameters  unfortunately worsening -Lasix + albumin today -Will consult renal if family wishes to continue aggressive measures. Meeting with palliative today.   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 -On Argatroban  Hyperglycemia -Continue SSI  History of rheumatoid arthritis -Immunomodulators on hold  Multiorgan dysfunction -Prognosis is poor   Best Practice (right click and "Reselect all SmartList Selections" daily)   Diet/type: NPO; holding TF today-vomiting DVT prophylaxis: other> argatroban GI prophylaxis: PPI Lines: Central line and yes and it is still needed> R femoral aline pending removal Foley:  Yes, and it is still needed> do not remove per surgery Code Status:  full code Last date of multidisciplinary goals of care discussion [per primary, family updated 9/25]   Critical care time 50 minutes  Helen Hardy, AGACNP-BC [place] for personal pager PCCM on call pager [phone] until 7pm. Please call Elink 7p-7a. [phone]  04/06/2022 11:18 AM

## 2022-04-07 DIAGNOSIS — K5732 Diverticulitis of large intestine without perforation or abscess without bleeding: Secondary | ICD-10-CM

## 2022-04-07 DIAGNOSIS — L8990 Pressure ulcer of unspecified site, unspecified stage: Secondary | ICD-10-CM | POA: Insufficient documentation

## 2022-04-07 DIAGNOSIS — K668 Other specified disorders of peritoneum: Secondary | ICD-10-CM | POA: Diagnosis not present

## 2022-04-07 MED ORDER — GLYCOPYRROLATE 0.2 MG/ML IJ SOLN
0.2000 mg | INTRAMUSCULAR | Status: DC | PRN
  Administered 2022-04-07: 0.2 mg via INTRAVENOUS
  Filled 2022-04-07 (×2): qty 1

## 2022-04-07 MED ORDER — FENTANYL 2500MCG IN NS 250ML (10MCG/ML) PREMIX INFUSION
0.0000 ug/h | INTRAVENOUS | Status: DC
  Administered 2022-04-07 (×2): 400 ug/h via INTRAVENOUS
  Filled 2022-04-07 (×2): qty 250

## 2022-04-07 MED ORDER — GLYCOPYRROLATE 0.2 MG/ML IJ SOLN: 0.2000 mg | INTRAMUSCULAR | Status: DC

## 2022-04-07 MED ORDER — MIDAZOLAM BOLUS VIA INFUSION (WITHDRAWAL LIFE SUSTAINING TX)
2.0000 mg | INTRAVENOUS | Status: DC | PRN
  Administered 2022-04-07: 1 mg via INTRAVENOUS
  Administered 2022-04-07 (×5): 2 mg via INTRAVENOUS

## 2022-04-07 MED ORDER — POLYVINYL ALCOHOL 1.4 % OP SOLN: 1.0000 [drp] | Freq: Four times a day (QID) | OPHTHALMIC | Status: DC | PRN

## 2022-04-07 MED ORDER — ACETAMINOPHEN 650 MG RE SUPP: 650.0000 mg | Freq: Four times a day (QID) | RECTAL | Status: DC | PRN

## 2022-04-07 MED ORDER — GLYCOPYRROLATE 0.2 MG/ML IJ SOLN: 0.2000 mg | INTRAMUSCULAR | Status: DC | PRN

## 2022-04-07 MED ORDER — GLYCOPYRROLATE 1 MG PO TABS: 1.0000 mg | ORAL_TABLET | ORAL | Status: DC

## 2022-04-07 MED ORDER — HALOPERIDOL LACTATE 5 MG/ML IJ SOLN: 2.5000 mg | INTRAMUSCULAR | Status: DC | PRN

## 2022-04-07 MED ORDER — ACETAMINOPHEN 325 MG PO TABS: 650.0000 mg | ORAL_TABLET | Freq: Four times a day (QID) | ORAL | Status: DC | PRN

## 2022-04-07 MED ORDER — SODIUM CHLORIDE 0.9 % IV SOLN: INTRAVENOUS | Status: DC

## 2022-04-07 MED ORDER — MIDAZOLAM-SODIUM CHLORIDE 100-0.9 MG/100ML-% IV SOLN
0.0000 mg/h | INTRAVENOUS | Status: DC
  Administered 2022-04-07: 0.5 mg/h via INTRAVENOUS
  Filled 2022-04-07: qty 100

## 2022-04-07 MED ORDER — GLYCOPYRROLATE 1 MG PO TABS: 1.0000 mg | ORAL_TABLET | ORAL | Status: DC | PRN

## 2022-04-07 MED ORDER — GLYCOPYRROLATE 0.2 MG/ML IJ SOLN
0.2000 mg | INTRAMUSCULAR | Status: DC
  Administered 2022-04-07 (×2): 0.2 mg via INTRAVENOUS
  Filled 2022-04-07 (×2): qty 1

## 2022-04-07 MED ORDER — MIDAZOLAM HCL 2 MG/2ML IJ SOLN
1.0000 mg | INTRAMUSCULAR | Status: AC | PRN
  Administered 2022-04-07 (×3): 1 mg via INTRAVENOUS

## 2022-04-07 MED ORDER — FENTANYL BOLUS VIA INFUSION
100.0000 ug | INTRAVENOUS | Status: DC | PRN
  Administered 2022-04-07 (×9): 100 ug via INTRAVENOUS

## 2022-04-08 ENCOUNTER — Other Ambulatory Visit (HOSPITAL_COMMUNITY): Payer: Medicare Other

## 2022-04-08 ENCOUNTER — Telehealth: Payer: Self-pay

## 2022-04-08 NOTE — Telephone Encounter (Signed)
Per Dr Marin Olp, pt passed away in the hospital. Flowers ordered for pt's spouse. dph

## 2022-04-10 ENCOUNTER — Other Ambulatory Visit: Payer: Medicare Other

## 2022-04-10 ENCOUNTER — Ambulatory Visit: Payer: Medicare Other | Admitting: Family

## 2022-04-10 ENCOUNTER — Inpatient Hospital Stay: Payer: Medicare Other

## 2022-04-13 ENCOUNTER — Other Ambulatory Visit: Payer: Medicare Other

## 2022-04-13 ENCOUNTER — Ambulatory Visit: Payer: Medicare Other

## 2022-04-13 ENCOUNTER — Ambulatory Visit: Payer: Medicare Other | Admitting: Family

## 2022-04-16 ENCOUNTER — Ambulatory Visit (HOSPITAL_COMMUNITY): Admission: RE | Admit: 2022-04-16 | Payer: Medicare Other | Source: Home / Self Care | Admitting: Orthopedic Surgery

## 2022-04-16 ENCOUNTER — Encounter (HOSPITAL_COMMUNITY): Admission: RE | Payer: Self-pay | Source: Home / Self Care

## 2022-04-16 SURGERY — FUSION, JOINT, FOOT
Anesthesia: General | Site: Heel | Laterality: Right

## 2022-05-06 NOTE — Progress Notes (Signed)
Patient restless with agitation after extubation, bolus of versed and fentanyl administered. Family at bedside with patient.

## 2022-05-06 NOTE — Progress Notes (Signed)
Chaplain was referred by Palliative Care to provide support for family.  They have followed her cues and her words of how tired she is and plan to allow her to pass peacefully tomorrow. Chaplain provided listening and support for Helen Hardy as well as Helen Hardy's daughters who were also at bedside.  Chaplain will return in the morning to assess for support needs.  [address] [place] Pager, [phone]

## 2022-05-06 NOTE — Progress Notes (Signed)
Nutrition Brief Note  Chart reviewed. Plan is for palliative extubation.  Pt now transitioning to comfort care.  No further nutrition interventions planned at this time. TPN d/c 10/2.   Clayton Bibles, MS, RD, LDN Inpatient Clinical Dietitian Contact information available via Amion

## 2022-05-06 NOTE — Progress Notes (Addendum)
NAMEJaid Hardy, MRN:  [id number], DOB:  07/27/1950, LOS: 9 ADMISSION DATE:  03/18/2022, CONSULTATION DATE:  9/24 REFERRING MD:  Donne Hazel, CHIEF COMPLAINT:  abdominal pain   History of Present Illness:  71 y/o female with extensive past medical history including rheumatoid arthritis on multiple immunosuppressants admitted with septic shock from perforated diverticulitis with bladder fistula requiring emergent ex-lap for sigmoid colectomy and bladder repair on 9/24.  She returned form the operating room early AM 9/25 with septic shock and remained on mechanical ventilatory support.    Pertinent  Medical History  Rheumatoid arthritis GERD Anemia of chronic disease Hyeprtension Non-hodgkin's lymphoma Prior ischemic colitis  Significant Hospital Events: Including procedures, antibiotic start and stop dates in addition to other pertinent events   9/24 admission for septic shock from perforated diverticulitis, emergent ex-lap with Dr. Donne Hazel for sigmoidectomy and bladder repair, brought to the ICU  9/24 cefepime x 1 9/24 vanc  x 1 9/24 flagyl x 1 9/24 zosyn >> 9/25 micafungin > complete 9/27 back to OR for ex lap, ostomy, debridement/closure of abd wound 9/28 off NE 9/30 appears to be weaning 10/2 SBT good, renal function worsening. Mental status remains poor.  10/2 PMT with family  Interim History / Subjective:    Objective   Blood pressure (!) 78/45, pulse 73, temperature 98.8 F (37.1 C), temperature source Axillary, resp. rate (!) 6, height _0  (1.499 m), weight 113.8 kg, SpO2 91 %.    Vent Mode: PRVC FiO2 (%):  [30 %] 30 % Set Rate:  [20 bmp] 20 bmp Vt Set:  [340 mL] 340 mL PEEP:  [5 cmH20] 5 cmH20 Pressure Support:  [5 cmH20] 5 cmH20 Plateau Pressure:  [20 cmH20] 20 cmH20   Intake/Output Summary (Last 24 hours) at May 06, 2022 0823 Last data filed at May 06, 2022 0535 Gross per 24 hour  Intake 2865.45 ml  Output 1275 ml  Net 1590.45 ml    Filed Weights    04/05/22 0500 04/06/22 0500 06-May-2022 0448  Weight: 113.7 kg 113.9 kg 113.8 kg   Examination: General:  elderly female in NAD HEENT: [place]/AT, ETT Neuro: Sedated, appears comfortably CV: Regular rate and rhythm PULM: Breathing comfortably. Synchronous with vent GI: not assessed extremities: Not assessed.   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 Specifically in the setting of worsening renal failure. Helen Hardy is not a good candidate for renal replacement therapy long term. Family did not believe the patient would want prolonged aggressive measures. Decision is made to transition to comfort care.   Plan:  Continue full vent support until family is able to assemble for a group visit with the patient Discontinue lab draws and finger sticks.  Fentanyl, precedex, propofol continue. No escalation to further aggressive measures DNR Will plan for palliative extubation today when family arrives Family is aware the patient may not survive to extubation   Best Practice (right click and "  Reselect all SmartList Selections" daily)   Diet/type: NPO; holding TF today-vomiting DVT prophylaxis: other> argatroban GI prophylaxis: PPI Lines: Central line and yes and it is  still needed> R femoral aline pending removal Foley:  Yes, and it is still needed> do not remove per surgery Code Status:  full code Last date of multidisciplinary goals of care discussion [per primary, family updated 9/25]  Critical care time: 28 minutes  Georgann Housekeeper, AGACNP-BC [place] for personal pager PCCM on call pager [phone] until 7pm. Please call Elink 7p-7a. [phone]  04/20/2022 8:23 AM

## 2022-05-06 NOTE — Procedures (Signed)
Extubation Procedure Note  Patient Details:   Name: Helen Hardy DOB: 10/19/1950 MRN: [id number]   Airway Documentation:    Vent end date: 04/08/2022 Vent end time: 1122   Evaluation  O2 sats: stable throughout Complications: No apparent complications Patient did tolerate procedure well. Bilateral Breath Sounds: Diminished   No  Martha Clan April 08, 2022, 11:25 AM

## 2022-05-06 NOTE — Death Summary Note (Signed)
DEATH SUMMARY   Patient Details  Name: Helen Hardy MRN: [id number] DOB: [birth year]  Admission/Discharge Information   Admit Date:  2022/04/13  Date of Death: Date of Death: 04-22-2022  Time of Death: Time of Death: 14-Sep-1953  Length of Stay: 2022/08/29  Referring Physician: Deland Pretty, MD   Reason(s) for Hospitalization  Septic Shock due to perforated diverticulitis  Diagnoses  Preliminary cause of death:  Septic Shock due to perforated diverticulitis  Secondary Diagnoses (including complications and co-morbidities):  Principal Problem:   Free intraperitoneal air Active Problems:   Acute renal failure ([place])   Bowel perforation ([place])   Acute hypoxemic respiratory failure ([place])   Septic shock ([place])   Peritonitis ([place])   Palliative care encounter   Goals of care, counseling/discussion   Abscess of sigmoid colon due to diverticulitis   Pressure injury of skin   Sigmoid diverticulitis   Brief Hospital Course (including significant findings, care, treatment, and services provided and events leading to death)  Helen Hardy is a 71 y.o. 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  She returned form the operating room early AM 73/25 with septic shock and remained on mechanical ventilatory support.  She developed progressive renal failure and overload. Palliative care was consulted and after many family discussions and lack of progress in patient's condition it was decided to transition to comfort care. She passed away 04/22/2022 at [place]73/09/24 admission for septic shock from perforated diverticulitis, emergent ex-lap with Dr. Donne Hazel for sigmoidectomy and bladder repair, brought to the ICU  04-14-23 cefepime x 1 04/14/2023 vanc  x 1 14-Apr-2023 flagyl x 1 14-Apr-2023 zosyn >> 9/25  micafungin > complete 9/27 back to OR for ex lap, ostomy, debridement/closure of abd wound 9/28 off NE 9/30 appears to be weaning 10/2 SBT good, renal function worsening. Mental status remains poor.  10/2 PMT with family  Pertinent Labs and Studies  Significant Diagnostic Studies DG CHEST PORT 1 VIEW  Result Date: 04/06/2022 CLINICAL DATA:  Ventilator dependence. EXAM: PORTABLE CHEST 1 VIEW COMPARISON:  04/05/2022 FINDINGS: 0558 hours. Low volume film. Bibasilar collapse/consolidation with bilateral pleural effusions, left greater than right. The cardio pericardial silhouette is enlarged. Endotracheal tube tip is 5.0 cm above the base of the carina. The NG tube passes into the stomach although the distal tip position is not included on the film. Right IJ central line overlies the region of the right atrium. Telemetry leads overlie the chest. IMPRESSION: Low volume film with bibasilar collapse/consolidation and bilateral pleural effusions, left greater than right. No substantial change. Electronically Signed   By: Misty Stanley M.D.   On: 04/06/2022 06:40   DG CHEST PORT 1 VIEW  Result Date: 04/05/2022 CLINICAL DATA:  Evaluate position of endotracheal tube, difficulty breathing EXAM: PORTABLE CHEST 1 VIEW COMPARISON:  Previous studies including the examination done on 04/03/2022 FINDINGS: Tip of endotracheal tube is 6 cm above the carina. Enteric tube is noted traversing the esophagus. Transverse diameter of heart is increased. Shift of mediastinum to the right has not changed. There is poor inspiration. There is haziness in the left mid and left lower lung fields. There is improvement in aeration of right lower lung fields suggesting decrease in pleural effusion and infiltrate. There is blunting of left lateral CP angle. There is no pneumothorax. IMPRESSION: Increased density in left lower lung field  may suggest pleural effusion and underlying atelectasis/pneumonia. There is improvement in the  aeration in right lower lung field. Tip of endotracheal tube is 6 cm above the carina, higher than usual in position. Electronically Signed   By: [place] Picker M.D.   On: 04/05/2022 12:53   DG Abd 1 View  Result Date: 04/05/2022 CLINICAL DATA:  Evaluate NG tube placement EXAM: ABDOMEN - 1 VIEW COMPARISON:  Previous studies including the examination done earlier today FINDINGS: Tip of enteric tube is seen in the region of fundus/body of the stomach. No significant interval changes are noted in position of enteric tube. Bowel gas pattern is nonspecific. Lower abdomen and pelvis are not included in the image. Increased density is seen in both lower lung fields suggesting pleural effusions and possibly infiltrates. IMPRESSION: Tip of enteric tube is seen in the stomach. Electronically Signed   By: [place] Picker M.D.   On: 04/05/2022 12:40   DG Abd 1 View  Result Date: 04/05/2022 CLINICAL DATA:  Evaluate NG tube placement EXAM: ABDOMEN - 1 VIEW COMPARISON:  03/25/2022 FINDINGS: Enteric tube tip and side port are in satisfactory position within the proximal stomach below the level of the GE junction. No dilated bowel loops identified. IMPRESSION: Enteric tube tip and side port are below the GE junction. Electronically Signed   By: Kerby Moors M.D.   On: 04/05/2022 09:22   [place] EKG SITE RITE  Result Date: 04/03/2022 If Site Rite image not attached, placement could not be confirmed due to current cardiac rhythm.  DG Chest Port 1 View  Result Date: 04/03/2022 CLINICAL DATA:  [id number] intubated, pleural effusions. History of perforated sigmoid diverticulitis. EXAM: PORTABLE CHEST 1 VIEW COMPARISON:  March 30, 2022 FINDINGS: Evaluation is limited by patient rotation. The cardiomediastinal silhouette is grossly unchanged in contour.ETT tip terminates 4.9 cm above the carina. The enteric tube courses through the chest to the abdomen beyond the field-of-view. RIGHT IJ CVC tip terminates over the  superior cavoatrial junction. Small layering bilateral pleural effusions. No significant pneumothorax. Bibasilar opacities. Gaseous distension of the stomach. IMPRESSION: 1. Support apparatus as described above. 2. Small layering bilateral pleural effusions with bibasilar opacities, likely atelectasis. Electronically Signed   By: Valentino Saxon M.D.   On: 04/03/2022 07:45   DG Abd Portable 1V  Result Date: 03/08/2022 CLINICAL DATA:  Abnormal count during abdominal surgery. EXAM: PORTABLE ABDOMEN - 1 VIEW COMPARISON:  None Available. FINDINGS: No surgical instrument, suture or radiopaque sponge is identified in the visualized abdomen or pelvis. Linear density near the right groin may represent a venous catheter. No abnormal calcifications or bony abnormalities. Visualized bowel gas pattern is unremarkable. IMPRESSION: No surgical instrument, suture or radiopaque sponge identified in the visualized abdomen or pelvis. Linear density at the level of the right groin may represent a femoral venous catheter. Electronically Signed   By: Aletta Edouard M.D.   On: 03/26/2022 14:12   ECHOCARDIOGRAM COMPLETE  Result Date: 03/30/2022    ECHOCARDIOGRAM REPORT   Patient Name:   EMMAJO BENNETTE Date of Exam: 03/30/2022 Medical Rec #:  [id number]         Height:       59.0 in Accession #:    [id number]        Weight:       227.1 lb Date of Birth:  1951/02/28         BSA:          1.947 m Patient Age:    37  years          BP:           97/60 mmHg Patient Gender: F                 HR:           120 bpm. Exam Location:  Inpatient Procedure: 2D Echo, Color Doppler and Cardiac Doppler Indications:    Acute respiratory distress  History:        Patient has no prior history of Echocardiogram examinations.                 Risk Factors:Hypertension.  Sonographer:    Memory Argue Referring Phys: [place]  1. Left ventricular ejection fraction, by estimation, is 35 to 40%. Left ventricular ejection  fraction by 2D MOD biplane is 39.9 %. The left ventricle has moderately decreased function. The left ventricle demonstrates global hypokinesis. Indeterminate diastolic filling due to E-A fusion.  2. Right ventricular systolic function is mildly reduced. The right ventricular size is normal. There is normal pulmonary artery systolic pressure. The estimated right ventricular systolic pressure is 03.4 mmHg.  3. The mitral valve is abnormal. No evidence of mitral valve regurgitation.  4. The aortic valve is tricuspid. Aortic valve regurgitation is not visualized. Aortic valve sclerosis is present, with no evidence of aortic valve stenosis.  5. The inferior vena cava is normal in size with greater than 50% respiratory variability, suggesting right atrial pressure of 3 mmHg. Comparison(s): No prior Echocardiogram. FINDINGS  Left Ventricle: Left ventricular ejection fraction, by estimation, is 35 to 40%. Left ventricular ejection fraction by 2D MOD biplane is 39.9 %. The left ventricle has moderately decreased function. The left ventricle demonstrates global hypokinesis. The left ventricular internal cavity size was normal in size. There is no left ventricular hypertrophy. Indeterminate diastolic filling due to E-A fusion. Right Ventricle: The right ventricular size is normal. No increase in right ventricular wall thickness. Right ventricular systolic function is mildly reduced. There is normal pulmonary artery systolic pressure. The tricuspid regurgitant velocity is 2.35 m/s, and with an assumed right atrial pressure of 3 mmHg, the estimated right ventricular systolic pressure is 74.2 mmHg. Left Atrium: Left atrial size was normal in size. Right Atrium: Right atrial size was normal in size. Pericardium: There is no evidence of pericardial effusion. Mitral Valve: The mitral valve is abnormal. There is mild calcification of the mitral valve leaflet(s). No evidence of mitral valve regurgitation. Tricuspid Valve: The  tricuspid valve is grossly normal. Tricuspid valve regurgitation is trivial. Aortic Valve: The aortic valve is tricuspid. Aortic valve regurgitation is not visualized. Aortic valve sclerosis is present, with no evidence of aortic valve stenosis. Aortic valve mean gradient measures 2.0 mmHg. Aortic valve peak gradient measures 4.0  mmHg. Aortic valve area, by VTI measures 3.01 cm. Pulmonic Valve: The pulmonic valve was normal in structure. Pulmonic valve regurgitation is not visualized. Aorta: The aortic root and ascending aorta are structurally normal, with no evidence of dilitation. Venous: The inferior vena cava is normal in size with greater than 50% respiratory variability, suggesting right atrial pressure of 3 mmHg. IAS/Shunts: No atrial level shunt detected by color flow Doppler.  LEFT VENTRICLE PLAX 2D                        Biplane EF (MOD) LVIDd:         4.00 cm  LV Biplane EF:   Left LVIDs:         3.30 cm                          ventricular LV PW:         0.90 cm                          ejection LV IVS:        0.90 cm                          fraction by LVOT diam:     2.10 cm                          2D MOD LV SV:         32                               biplane is LV SV Index:   17                               39.9 %. LVOT Area:     3.46 cm                                Diastology                                LV e' medial:    5.00 cm/s LV Volumes (MOD)               LV E/e' medial:  8.8 LV vol d, MOD    79.9 ml       LV e' lateral:   7.29 cm/s A2C:                           LV E/e' lateral: 6.0 LV vol d, MOD    77.3 ml A4C: LV vol s, MOD    58.8 ml A2C: LV vol s, MOD    45.8 ml A4C: LV SV MOD A2C:   21.1 ml LV SV MOD A4C:   77.3 ml LV SV MOD BP:    34.4 ml RIGHT VENTRICLE RV S prime:     9.68 cm/s TAPSE (M-mode): 1.3 cm LEFT ATRIUM             Index        RIGHT ATRIUM           Index LA diam:        2.80 cm 1.44 cm/m   RA Area:     11.90 cm LA Vol (A2C):   30.8 ml 15.82 ml/m  RA  Volume:   21.70 ml  11.15 ml/m LA Vol (A4C):   43.9 ml 22.55 ml/m LA Biplane Vol: 39.8 ml 20.45 ml/m  AORTIC VALVE AV Area (Vmax):    2.75 cm AV Area (Vmean):   2.75 cm AV Area (VTI):     3.01 cm AV Vmax:           99.90 cm/s AV Vmean:  65.200 cm/s AV VTI:            0.108 m AV Peak Grad:      4.0 mmHg AV Mean Grad:      2.0 mmHg LVOT Vmax:         79.30 cm/s LVOT Vmean:        51.800 cm/s LVOT VTI:          0.094 m LVOT/AV VTI ratio: 0.87  AORTA Ao Root diam: 3.10 cm MITRAL VALVE               TRICUSPID VALVE MV Area (PHT): 4.96 cm    TR Peak grad:   22.1 mmHg MV Decel Time: 153 msec    TR Vmax:        235.00 cm/s MV E velocity: 44.10 cm/s MV A velocity: 66.40 cm/s  SHUNTS MV E/A ratio:  0.66        Systemic VTI:  0.09 m                            Systemic Diam: 2.10 cm Lyman Bishop MD Electronically signed by Lyman Bishop MD Signature Date/Time: 03/30/2022/10:54:45 AM    Final    DG Chest Port 1 View  Result Date: 03/30/2022 CLINICAL DATA:  Respiratory failure EXAM: PORTABLE CHEST 1 VIEW COMPARISON:  03/31/2022 FINDINGS: The endotracheal tube seen 4.0 cm above the carina. Nasogastric tube extends into the mid body of the stomach beyond the margin of the examination. Right internal jugular temporary hemodialysis catheter tip noted within the superior right atrium. Lung volumes are extremely small, but appears stable since prior examination. Bibasilar opacification is present suggesting the presence of small bilateral pleural effusions, left greater than right. No pneumothorax. Cardiac size within normal limits. Pulmonary vascularity is normal. No acute bone abnormality. IMPRESSION: 1. Support tubes in appropriate position. 2. Marked pulmonary hypoinflation. 3. Suspected small bilateral pleural effusions, left greater than right. Electronically Signed   By: Fidela Salisbury M.D.   On: 03/30/2022 03:34   DG CHEST PORT 1 VIEW  Result Date: 03/30/2022 CLINICAL DATA:  Intubation film, check tube  placement. EXAM: PORTABLE CHEST 1 VIEW COMPARISON:  Portable chest earlier today at 6:05 p.m. FINDINGS: 11:36 p.m. ETT is in place with tip 1.2 cm from carina. Suggest withdrawing the tube 3 cm to a mid tracheal positioning to avoid right main bronchus intubation. There is a right IJ double-lumen catheter with its tip in the upper right atrium. There is no pneumothorax. Standard NGT is in place with the tip in the body of the stomach. The lungs are expiratory with basilar atelectasis and limited view of the bases. Visualized lung fields are clear of infiltrate. There is mild cardiomegaly. No vascular congestion or edema is seen. Mild aortic tortuosity and ectasia.  There is thoracic spondylosis. IMPRESSION: 1. ETT tip is 1.2 cm from the carina, and could be withdrawn 3 cm to a mid tracheal positioning to lessen the likelihood of right main bronchus intubation if desired. 2. Other support apparatus as above. 3. Expiratory exam with basilar atelectasis and limited view of the bases. 4. Cardiomegaly without CHF findings. Electronically Signed   By: Telford Nab M.D.   On: 03/30/2022 00:39   CT Angio Abd/Pel W and/or Wo Contrast  Result Date: 03/15/2022 CLINICAL DATA:  Acute onset of severe abdominal pain, nausea and vomiting, and diarrhea. Clinical suspicion for bowel ischemia. Previous diverticulitis. EXAM: CTA ABDOMEN AND PELVIS  WITHOUT AND WITH CONTRAST TECHNIQUE: Multidetector CT imaging of the abdomen and pelvis was performed using the standard protocol during bolus administration of intravenous contrast. Multiplanar reconstructed images and MIPs were obtained and reviewed to evaluate the vascular anatomy. RADIATION DOSE REDUCTION: This exam was performed according to the departmental dose-optimization program which includes automated exposure control, adjustment of the mA and/or kV according to patient size and/or use of iterative reconstruction technique. CONTRAST:  63m OMNIPAQUE IOHEXOL 350 MG/ML SOLN  COMPARISON:  06/17/2021 FINDINGS: VASCULAR Aorta: Normal caliber aorta without aneurysm, dissection, vasculitis or significant stenosis. Celiac: Patent without evidence of aneurysm, dissection, vasculitis or significant stenosis. SMA: Patent without evidence of aneurysm, dissection, vasculitis or significant stenosis. Renals: Both renal arteries are patent without evidence of aneurysm, dissection, vasculitis, fibromuscular dysplasia or significant stenosis. IMA: Patent. Inflow: Patent without evidence of aneurysm, dissection, vasculitis or significant stenosis. Proximal Outflow: Bilateral common femoral and visualized portions of the superficial and profunda femoral arteries are patent without evidence of aneurysm, dissection, vasculitis or significant stenosis. Veins: No obvious venous abnormality within the limitations of this arterial phase study. Review of the MIP images confirms the above findings. NON-VASCULAR Lower Chest: Bilateral lower lobe infiltrates. No evidence of pleural effusion. Hepatobiliary: No hepatic masses identified. Gallbladder is unremarkable. No evidence of biliary ductal dilatation. Pancreas:  No mass or inflammatory changes. Spleen: Within normal limits in size and appearance. Adrenals/Urinary Tract: No suspicious masses identified. No evidence of ureteral calculi or hydronephrosis. Stomach/Bowel: Moderate amount of free intraperitoneal air is seen, consistent with bowel perforation. Moderate sigmoid diverticulitis is seen with adjacent extraluminal air bubbles, consistent with perforation. Adjacent diverticular abscess containing gas and fluid is also seen measuring 4.2 x 4.1 cm. Vascular/Lymphatic: No pathologically enlarged lymph nodes. No acute vascular findings. Reproductive: Prior hysterectomy noted. Adnexal regions are unremarkable in appearance. Other:  Small left inguinal hernia is seen which contains only fat. Musculoskeletal:  No suspicious bone lesions identified. IMPRESSION:  No evidence of mesenteric ischemia or other acute vascular pathology. Perforated sigmoid diverticulitis, with moderate amount of free intraperitoneal air. 4.2 cm sigmoid diverticular abscess. Bilateral lower lobe infiltrates. Small left inguinal hernia, which contains only fat. Critical Value/emergent results were called by telephone at the time of interpretation on 03/28/2022 at 6:38 pm to [facility] who verbally acknowledged these results. Electronically Signed   By: JMarlaine HindM.D.   On: 03/17/2022 18:41   DG Chest Port 1 View  Result Date: 03/12/2022 CLINICAL DATA:  Questionable sepsis. EXAM: PORTABLE CHEST 1 VIEW COMPARISON:  Nov 08, 2020 FINDINGS: Tortuosity of the thoracic aorta. Cardiomediastinal silhouette is normal. Mediastinal contours appear intact. There is no evidence of lobar airspace consolidation, pleural effusion or pneumothorax. Low lung volumes. Bibasilar atelectasis versus peribronchial airspace consolidation. Osseous structures are without acute abnormality. Soft tissues are grossly normal. IMPRESSION: Low lung volumes with bibasilar atelectasis versus peribronchial airspace consolidation. Electronically Signed   By: DFidela SalisburyM.D.   On: 03/10/2022 18:15    Microbiology Recent Results (from the past 240 hour(s))  Culture, blood (routine x 2)     Status: Abnormal   Collection Time: 03/22/2022  6:10 PM   Specimen: BLOOD  Result Value Ref Range Status   Specimen Description   Final    BLOOD LEFT ANTECUBITAL Performed at [id number] [place] [address]   Special Requests   Final    BOTTLES DRAWN AEROBIC AND ANAEROBIC Blood Culture adequate volume [facility], [place] [address], [place], [place] [address]    Culture  Setup Time   Final    GRAM POSITIVE COCCI IN CLUSTERS AEROBIC BOTTLE ONLY CRITICAL RESULT CALLED TO, READ BACK BY AND VERIFIED WITH:  C/ PHARMD A. ELLINGTON  03/30/22 2124 A. LAFRANCE    Culture (A)  Final    STAPHYLOCOCCUS HOMINIS THE SIGNIFICANCE OF ISOLATING THIS ORGANISM FROM A SINGLE SET OF BLOOD CULTURES WHEN MULTIPLE SETS ARE DRAWN IS UNCERTAIN. PLEASE NOTIFY THE MICROBIOLOGY DEPARTMENT WITHIN ONE WEEK IF SPECIATION AND SENSITIVITIES ARE REQUIRED. Performed at [facility], [place] [address] [place], [place] [address]    Report Status 03/18/2022 FINAL  Final  Blood Culture ID Panel (Reflexed)     Status: Abnormal   Collection Time: 03/31/2022  6:10 PM  Result Value Ref Range Status   Enterococcus faecalis NOT DETECTED NOT DETECTED Final   Enterococcus Faecium NOT DETECTED NOT DETECTED Final   Listeria monocytogenes NOT DETECTED NOT DETECTED Final   Staphylococcus species DETECTED (A) NOT DETECTED Final    Comment: CRITICAL RESULT CALLED TO, READ BACK BY AND VERIFIED WITH:  C/ PHARMD A. ELLINGTON 03/30/22 2124 A. LAFRANCE    Staphylococcus aureus (BCID) NOT DETECTED NOT DETECTED Final   Staphylococcus epidermidis NOT DETECTED NOT DETECTED Final   Staphylococcus lugdunensis NOT DETECTED NOT DETECTED Final   Streptococcus species NOT DETECTED NOT DETECTED Final   Streptococcus agalactiae NOT DETECTED NOT DETECTED Final   Streptococcus pneumoniae NOT DETECTED NOT DETECTED Final   Streptococcus pyogenes NOT DETECTED NOT DETECTED Final   A.calcoaceticus-baumannii NOT DETECTED NOT DETECTED Final   Bacteroides fragilis NOT DETECTED NOT DETECTED Final   Enterobacterales NOT DETECTED NOT DETECTED Final   Enterobacter cloacae complex NOT DETECTED NOT DETECTED Final   Escherichia coli NOT DETECTED NOT DETECTED Final   Klebsiella aerogenes NOT DETECTED NOT DETECTED Final   Klebsiella oxytoca NOT DETECTED NOT DETECTED Final   Klebsiella pneumoniae NOT DETECTED NOT DETECTED Final   Proteus species NOT DETECTED NOT DETECTED Final   Salmonella species NOT DETECTED NOT DETECTED Final   Serratia marcescens NOT DETECTED NOT DETECTED Final    Haemophilus influenzae NOT DETECTED NOT DETECTED Final   Neisseria meningitidis NOT DETECTED NOT DETECTED Final   Pseudomonas aeruginosa NOT DETECTED NOT DETECTED Final   Stenotrophomonas maltophilia NOT DETECTED NOT DETECTED Final   Candida albicans NOT DETECTED NOT DETECTED Final   Candida auris NOT DETECTED NOT DETECTED Final   Candida glabrata NOT DETECTED NOT DETECTED Final   Candida krusei NOT DETECTED NOT DETECTED Final   Candida parapsilosis NOT DETECTED NOT DETECTED Final   Candida tropicalis NOT DETECTED NOT DETECTED Final   Cryptococcus neoformans/gattii NOT DETECTED NOT DETECTED Final    Comment: Performed at [facility], [address], [place], [place] [address]  Resp Panel by RT-PCR (Flu A&B, Covid) Anterior Nasal Swab     Status: None   Collection Time: 03/24/2022  6:12 PM   Specimen: Anterior Nasal Swab  Result Value Ref Range Status   SARS Coronavirus 2 by RT PCR NEGATIVE NEGATIVE Final    Comment: (NOTE) SARS-CoV-2 target nucleic acids are NOT DETECTED.  The SARS-CoV-2 RNA is generally detectable in upper respiratory specimens during the acute phase of infection. The lowest concentration of SARS-CoV-2 viral copies this assay can detect is 138 copies/mL. A negative result does not preclude SARS-Cov-2 infection and should not be used as the sole basis for treatment or other patient management decisions. A negative  result may occur with  improper specimen collection/handling, submission of specimen other than nasopharyngeal swab, presence of viral mutation(s) within the areas targeted by this assay, and inadequate number of viral copies(<138 copies/mL). A negative result must be combined with clinical observations, patient history, and epidemiological information. The expected result is Negative.  Fact Sheet for Patients:  EntrepreneurPulse.com.au  Fact Sheet for Healthcare Providers:  IncredibleEmployment.be  This test is  no t yet approved or cleared by the [place] FDA and  has been authorized for detection and/or diagnosis of SARS-CoV-2 by FDA under an Emergency Use Authorization (EUA). This EUA will remain  in effect (meaning this test can be used) for the duration of the COVID-19 declaration under Section 564(b)(1) of the Act, 21 U.S.C.section 360bbb-3(b)(1), unless the authorization is terminated  or revoked sooner.       Influenza A by PCR NEGATIVE NEGATIVE Final   Influenza B by PCR NEGATIVE NEGATIVE Final    Comment: (NOTE) The Xpert Xpress SARS-CoV-2/FLU/RSV plus assay is intended as an aid in the diagnosis of influenza from Nasopharyngeal swab specimens and should not be used as a sole basis for treatment. Nasal washings and aspirates are unacceptable for Xpert Xpress SARS-CoV-2/FLU/RSV testing.  Fact Sheet for Patients: EntrepreneurPulse.com.au  Fact Sheet for Healthcare Providers: IncredibleEmployment.be  This test is not yet approved or cleared by the [place] FDA and has been authorized for detection and/or diagnosis of SARS-CoV-2 by FDA under an Emergency Use Authorization (EUA). This EUA will remain in effect (meaning this test can be used) for the duration of the COVID-19 declaration under Section 564(b)(1) of the Act, 21 U.S.C. section 360bbb-3(b)(1), unless the authorization is terminated or revoked.  Performed at [facility], [place] [address] [place], [place] [address]   Culture, blood (routine x 2)     Status: None   Collection Time: 04/02/2022  6:25 PM   Specimen: BLOOD  Result Value Ref Range Status   Specimen Description   Final    BLOOD BLOOD RIGHT HAND Performed at [facility] [address] [place], [place] [address]    Special Requests   Final    BOTTLES DRAWN AEROBIC AND ANAEROBIC Blood Culture adequate volume Performed at [facility] [address],  [place], [place] [address]    Culture   Final    NO GROWTH 5 DAYS Performed at [facility], [place] [address] [place], [place] [address]    Report Status 04/03/2022 FINAL  Final  Urine Culture     Status: None   Collection Time: 03/30/22  4:16 AM   Specimen: Urine, Random  Result Value Ref Range Status   Specimen Description   Final    URINE, RANDOM Performed at [facility] [address], [place], [place] [address]    Special Requests   Final    NONE Performed at [facility], [place] [address] [place], [place] [address]    Culture   Final    NO GROWTH Performed at [facility], [place] [address], [place],  [address]    Report Status 03/31/2022 FINAL  Final  MRSA Next Gen by PCR, Nasal     Status: Abnormal   Collection Time: 03/30/22  4:42 AM  Result Value Ref Range Status   MRSA by PCR Next Gen DETECTED (A) NOT DETECTED Final    Comment: RESULT CALLED TO, READ BACK BY AND VERIFIED WITH: Lily Peer RN AT  0957 ON 03/30/2022 BY MECIAL J. (NOTE) The GeneXpert MRSA Assay (FDA approved for NASAL specimens only), is one component of a comprehensive MRSA colonization surveillance program. It is not intended to diagnose MRSA infection nor to guide or monitor treatment for MRSA infections. Test performance is not FDA approved in patients less than 63 years old. Performed at [facility], [place] [address] [place], [place] [address]     Lab Basic Metabolic Panel: Recent Labs  Lab 04/02/22 0419 04/03/22 0317 04/04/22 0512 04/05/22 0512 04/05/22 0855 04/06/22 0500  NA 144 143 144 143  --  141  K 3.7 4.2 3.8 4.0  --  4.6  CL 115* 117* 115* 112*  --  111  CO2 21* 20* 22 19*  --  19*  GLUCOSE 152* 180* 166* 179*  --  227*  BUN 43* 51* 69* 90*  --  113*  CREATININE 1.81* 1.67* 2.37* 3.16*  --  3.69*  CALCIUM 7.0* 7.4* 7.6* 7.8*  --  7.9*  MG 2.4 2.4 2.3  --  2.4 2.2  PHOS 3.6 3.1 4.5 5.2*  --  5.1*   Liver  Function Tests: Recent Labs  Lab 04/02/22 0419 04/03/22 0317 04/04/22 0512 04/05/22 0512 04/06/22 0500  AST 39  --   --   --  16  ALT 39  --   --   --  27  ALKPHOS 59  --   --   --  55  BILITOT 0.6  --   --   --  0.8  PROT 4.5*  --   --   --  4.9*  ALBUMIN 2.0* 2.0* 2.0* 2.1* 2.1*   No results for input(s): "LIPASE", "AMYLASE" in the last 168 hours. No results for input(s): "AMMONIA" in the last 168 hours. CBC: Recent Labs  Lab 04/02/22 1234 04/03/22 0317 04/04/22 0512 04/04/22 2021 04/05/22 0512 04/06/22 0500  WBC 11.6* 8.5 10.1  --  11.4* 10.2  NEUTROABS  --  7.2  --   --   --   --   HGB 8.5* 8.2* 7.3* 10.3* 10.3* 10.1*  HCT 25.6* 24.7* 22.3* 31.2* 30.6* 31.2*  MCV 98.1 99.2 100.9*  --  96.8 99.4  PLT 78* 61* 56*  --  54* 54*   Cardiac Enzymes: No results for input(s): "CKTOTAL", "CKMB", "CKMBINDEX", "TROPONINI" in the last 168 hours. Sepsis Labs: Recent Labs  Lab 04/03/22 0317 04/04/22 0512 04/05/22 0512 04/06/22 0500  WBC 8.5 10.1 11.4* 10.2    Procedures/Operations  Exploratory laparotomy with sigmoid colectomy; repair of bladder injury, possible fistula; placement of negative pressure dressing 9/24  EXPLORATORY LAPAROTOMY; OSTOMY; DEBRIDEMENT CLOSURE/ABDOMINAL WOUND 9/27  Endotracheal Intubation Central Line placement  Freddi Starr 04/08/2022, 3:58 PM

## 2022-05-06 NOTE — Progress Notes (Signed)
100 mL of Fentanyl wasted with Meda Klinefelter, RN. Placed in stericycle.

## 2022-05-06 NOTE — Progress Notes (Signed)
Daily Progress Note   Patient Name: Helen Hardy       Date: 04/10/2022 DOB: 1951-05-04  Age: 71 y.o. MRN#: [id number] Attending Physician: Freddi Starr, MD Primary Care Physician: Deland Pretty, MD Admit Date: 03/19/2022 Length of Stay: 9 days  Reason for Consultation/Follow-up: Establishing goals of care, Pain control, Psychosocial/spiritual support, and Withdrawal of life-sustaining treatment  Subjective:   CC: Patient intubated and sedated. Followed up with family regarding palliative extubation.   Subjective: Review EMR prior to presentation to bedside. BP trending down in end of life setting.  Spoke with bedside RN and ICU team regarding patient's care. Discussed conversion of medication prior to palliative extubation.  Presented to bedside and reintroduced myself. Patient's daughter and another family member were present at bedside. Spent time allowing for emotional support. Family offered that multiple family members and friends were able to visit yesterday. Daughter stated the family knows today at [facility] is the "deadline" because her mother would not want to be kept suffering in the state on the ventilator. Planning for palliative extubation at that time.  Patient appears much more comfortable today on examination. No signs of agitation currently noted. Family agreed with this and voiced appreciation that patient is "at peace".   All questions answered at that time. Noted palliative care team available if needed.   Review of Systems Unable to obtain ROS from patient due to underlying medical status.   Objective:   Vital Signs:  BP (!) 78/45   Pulse 73   Temp 98.8 F (37.1 C) (Axillary)   Resp (!) 6   Ht 4' 11"  (1.499 m)   Wt 113.8 kg   SpO2 91%   BMI 50.67 kg/m   Physical Exam:  Physical Exam: General: Intubated, appears much more comfortable than yesterday, no signs of discomfort/distress HENT: ET tube in place on ventilator support Cardiovascular:  RRR Respiratory: on ventilator support via ET tube Abdomen: distended Extremities: edema in LE b/l Skin: no rashes or lesions on visible skin Neuro: not following commands  Assessment & Plan:   Assessment: Patient is a 71 year old female with an extensive past medical history which includes rheumatoid arthritis on multiple immunosuppressants, GERD, anemia of chronic disease, hypertension, non-Hodgkin's lymphoma, and prior ischemic colitis who was admitted on 9/24 for management of septic shock secondary to perforated diverticulitis with bladder fistula requiring emergent ex lap for sigmoid colectomy and bladder repair on 9/24. Continued deterioration in medical status due ot multiple system organ failure. On 10/2, transitioned to comfort focus. Plan for palliative extubation today.   Recommendations/Plan: # Complex medical decision making/goals of care:                -Patient unable to participate in complex medical decision making due to her underlying medical status.                -Planning for palliative extubation today around 11AM once family present. Continuing to focus on symptom management prior to and after extubation. Patient will likely die at the hospital as vitals not stable for transfer to inpatient facility at this time. Family also preferred patient die here as she worked her for 20 years and she loved this hospital.   -  Code Status: DNR  Prognosis: Palliative extubation today- likely minutes to hours after this  # Symptom management:  -Discussed with primary ICU team who is managing currently. Will provide glycopyrrolate IV ~1 hour prior to extubation to assist with secretion management.   # Discharge Planning:  Patient will die at hospital on comfort care measures.   Discussed with: family, bedside RN, ICU team  Thank you for allowing [place] to participate in the care of Nabiha Porter-Jones. Please reach out if palliative care team can be of further assistance.  This  provider spent a total of 52 minutes providing patient's care.  Includes review of EMR, discussing care with other staff members involved in patient's medical care, obtaining relevant history and information from patient and/or patient's family, and recommendations regarding palliative extubation. Greater than 50% of the time was spent counseling and coordinating care related to the above assessment and plan.    Chelsea Aus, DO Palliative Care Provider Team Phone # [phone] (Nights/Weekends)

## 2022-05-06 NOTE — Progress Notes (Signed)
Chaplain provided grief support to family throughout the morning and early afternoon. Helen Hardy is beloved and had many family and friends at her bedside.  Chaplain led the family in a prayer and remained present with them, checking in on individuals throughout the morning.  [address] [place] Pager, [phone]

## 2022-05-06 DEATH — deceased

## 2022-05-20 ENCOUNTER — Ambulatory Visit: Payer: Medicare Other

## 2022-05-20 ENCOUNTER — Other Ambulatory Visit: Payer: Medicare Other

## 2022-08-19 ENCOUNTER — Other Ambulatory Visit: Payer: Medicare Other
